# Patient Record
Sex: Female | Born: 1965
Health system: Southern US, Community
[De-identification: ages and names within clinical notes are randomized; demographics above are authoritative.]

## PROBLEM LIST (undated history)

## (undated) DIAGNOSIS — J189 Pneumonia, unspecified organism: Secondary | ICD-10-CM

## (undated) DIAGNOSIS — K5790 Diverticulosis of intestine, part unspecified, without perforation or abscess without bleeding: Secondary | ICD-10-CM

## (undated) DIAGNOSIS — E0590 Thyrotoxicosis, unspecified without thyrotoxic crisis or storm: Secondary | ICD-10-CM

## (undated) DIAGNOSIS — T4145XA Adverse effect of unspecified anesthetic, initial encounter: Secondary | ICD-10-CM

## (undated) DIAGNOSIS — R011 Cardiac murmur, unspecified: Secondary | ICD-10-CM

## (undated) DIAGNOSIS — M1990 Unspecified osteoarthritis, unspecified site: Secondary | ICD-10-CM

## (undated) DIAGNOSIS — F411 Generalized anxiety disorder: Secondary | ICD-10-CM

## (undated) DIAGNOSIS — F17200 Nicotine dependence, unspecified, uncomplicated: Secondary | ICD-10-CM

## (undated) DIAGNOSIS — G5600 Carpal tunnel syndrome, unspecified upper limb: Secondary | ICD-10-CM

## (undated) DIAGNOSIS — T8859XA Other complications of anesthesia, initial encounter: Secondary | ICD-10-CM

## (undated) DIAGNOSIS — E785 Hyperlipidemia, unspecified: Secondary | ICD-10-CM

## (undated) DIAGNOSIS — I471 Supraventricular tachycardia, unspecified: Secondary | ICD-10-CM

## (undated) DIAGNOSIS — Z923 Personal history of irradiation: Secondary | ICD-10-CM

## (undated) DIAGNOSIS — I341 Nonrheumatic mitral (valve) prolapse: Secondary | ICD-10-CM

## (undated) DIAGNOSIS — C50311 Malignant neoplasm of lower-inner quadrant of right female breast: Secondary | ICD-10-CM

## (undated) DIAGNOSIS — R000 Tachycardia, unspecified: Secondary | ICD-10-CM

## (undated) DIAGNOSIS — E119 Type 2 diabetes mellitus without complications: Secondary | ICD-10-CM

## (undated) HISTORY — DX: Nicotine dependence, unspecified, uncomplicated: F17.200

## (undated) HISTORY — PX: ABDOMINAL HYSTERECTOMY: SHX81

## (undated) HISTORY — DX: Pneumonia, unspecified organism: J18.9

## (undated) HISTORY — PX: TUBAL LIGATION: SHX77

## (undated) HISTORY — DX: Personal history of irradiation: Z92.3

## (undated) HISTORY — DX: Thyrotoxicosis, unspecified without thyrotoxic crisis or storm: E05.90

## (undated) HISTORY — DX: Supraventricular tachycardia, unspecified: I47.10

## (undated) HISTORY — DX: Malignant neoplasm of lower-inner quadrant of right female breast: C50.311

## (undated) HISTORY — DX: Generalized anxiety disorder: F41.1

## (undated) HISTORY — DX: Hyperlipidemia, unspecified: E78.5

## (undated) HISTORY — PX: GANGLION CYST EXCISION: SHX1691

## (undated) HISTORY — DX: Diverticulosis of intestine, part unspecified, without perforation or abscess without bleeding: K57.90

## (undated) HISTORY — PX: CARPAL TUNNEL RELEASE: SHX101

## (undated) HISTORY — PX: APPENDECTOMY: SHX54

---
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
## 1997-09-25 ENCOUNTER — Ambulatory Visit (HOSPITAL_COMMUNITY): Admission: RE | Admit: 1997-09-25 | Discharge: 1997-09-25 | Payer: Self-pay | Admitting: *Deleted

## 1997-11-01 ENCOUNTER — Ambulatory Visit (HOSPITAL_COMMUNITY): Admission: RE | Admit: 1997-11-01 | Discharge: 1997-11-01 | Payer: Self-pay | Admitting: *Deleted

## 1998-05-28 ENCOUNTER — Emergency Department (HOSPITAL_COMMUNITY): Admission: EM | Admit: 1998-05-28 | Discharge: 1998-05-28 | Payer: Self-pay | Admitting: Emergency Medicine

## 1998-08-06 ENCOUNTER — Encounter: Admission: RE | Admit: 1998-08-06 | Discharge: 1998-11-04 | Payer: Self-pay | Admitting: Gynecology

## 1998-10-06 ENCOUNTER — Inpatient Hospital Stay (HOSPITAL_COMMUNITY): Admission: AD | Admit: 1998-10-06 | Discharge: 1998-10-09 | Payer: Self-pay | Admitting: Obstetrics and Gynecology

## 1998-10-06 ENCOUNTER — Encounter: Payer: Self-pay | Admitting: Obstetrics and Gynecology

## 1998-10-15 ENCOUNTER — Inpatient Hospital Stay (HOSPITAL_COMMUNITY): Admission: AD | Admit: 1998-10-15 | Discharge: 1998-10-17 | Payer: Self-pay | Admitting: Gynecology

## 1998-10-15 ENCOUNTER — Encounter (INDEPENDENT_AMBULATORY_CARE_PROVIDER_SITE_OTHER): Payer: Self-pay | Admitting: Specialist

## 1998-12-23 ENCOUNTER — Ambulatory Visit (HOSPITAL_COMMUNITY): Admission: RE | Admit: 1998-12-23 | Discharge: 1998-12-23 | Payer: Self-pay | Admitting: Gynecology

## 2000-03-07 ENCOUNTER — Encounter: Payer: Self-pay | Admitting: Gynecology

## 2000-03-11 ENCOUNTER — Inpatient Hospital Stay (HOSPITAL_COMMUNITY): Admission: RE | Admit: 2000-03-11 | Discharge: 2000-03-12 | Payer: Self-pay | Admitting: Gynecology

## 2003-12-31 ENCOUNTER — Ambulatory Visit: Payer: Self-pay | Admitting: Internal Medicine

## 2004-04-14 ENCOUNTER — Ambulatory Visit: Payer: Self-pay | Admitting: Internal Medicine

## 2004-12-21 ENCOUNTER — Emergency Department (HOSPITAL_COMMUNITY): Admission: EM | Admit: 2004-12-21 | Discharge: 2004-12-21 | Payer: Self-pay | Admitting: Emergency Medicine

## 2004-12-28 ENCOUNTER — Ambulatory Visit: Payer: Self-pay | Admitting: Internal Medicine

## 2005-01-04 ENCOUNTER — Ambulatory Visit: Payer: Self-pay | Admitting: Endocrinology

## 2005-01-15 ENCOUNTER — Ambulatory Visit: Payer: Self-pay | Admitting: Internal Medicine

## 2005-02-22 HISTORY — PX: KNEE SURGERY: SHX244

## 2005-02-22 HISTORY — PX: ANTERIOR CRUCIATE LIGAMENT REPAIR: SHX115

## 2005-06-23 ENCOUNTER — Encounter: Payer: Self-pay | Admitting: *Deleted

## 2005-06-30 ENCOUNTER — Ambulatory Visit: Payer: Self-pay | Admitting: Pulmonary Disease

## 2005-07-05 ENCOUNTER — Ambulatory Visit: Payer: Self-pay | Admitting: Internal Medicine

## 2005-07-08 ENCOUNTER — Emergency Department (HOSPITAL_COMMUNITY): Admission: EM | Admit: 2005-07-08 | Discharge: 2005-07-08 | Payer: Self-pay | Admitting: Family Medicine

## 2005-08-16 ENCOUNTER — Ambulatory Visit (HOSPITAL_BASED_OUTPATIENT_CLINIC_OR_DEPARTMENT_OTHER): Admission: RE | Admit: 2005-08-16 | Discharge: 2005-08-16 | Payer: Self-pay | Admitting: Orthopedic Surgery

## 2006-01-20 ENCOUNTER — Ambulatory Visit: Payer: Self-pay | Admitting: Internal Medicine

## 2006-03-24 ENCOUNTER — Ambulatory Visit: Payer: Self-pay | Admitting: Internal Medicine

## 2006-06-15 ENCOUNTER — Ambulatory Visit: Payer: Self-pay | Admitting: Endocrinology

## 2006-11-14 ENCOUNTER — Encounter: Payer: Self-pay | Admitting: *Deleted

## 2006-11-14 DIAGNOSIS — I059 Rheumatic mitral valve disease, unspecified: Secondary | ICD-10-CM | POA: Insufficient documentation

## 2006-11-14 DIAGNOSIS — I10 Essential (primary) hypertension: Secondary | ICD-10-CM | POA: Insufficient documentation

## 2006-11-14 DIAGNOSIS — E0590 Thyrotoxicosis, unspecified without thyrotoxic crisis or storm: Secondary | ICD-10-CM | POA: Insufficient documentation

## 2007-01-26 ENCOUNTER — Ambulatory Visit: Payer: Self-pay | Admitting: Internal Medicine

## 2007-02-07 ENCOUNTER — Ambulatory Visit: Payer: Self-pay | Admitting: Internal Medicine

## 2008-02-02 ENCOUNTER — Ambulatory Visit: Payer: Self-pay | Admitting: Internal Medicine

## 2008-02-13 ENCOUNTER — Encounter (INDEPENDENT_AMBULATORY_CARE_PROVIDER_SITE_OTHER): Payer: Self-pay | Admitting: *Deleted

## 2009-01-30 ENCOUNTER — Telehealth (INDEPENDENT_AMBULATORY_CARE_PROVIDER_SITE_OTHER): Payer: Self-pay | Admitting: *Deleted

## 2009-05-26 ENCOUNTER — Ambulatory Visit: Payer: Self-pay | Admitting: Endocrinology

## 2009-05-26 DIAGNOSIS — F17200 Nicotine dependence, unspecified, uncomplicated: Secondary | ICD-10-CM | POA: Insufficient documentation

## 2009-05-26 DIAGNOSIS — F411 Generalized anxiety disorder: Secondary | ICD-10-CM | POA: Insufficient documentation

## 2009-05-26 DIAGNOSIS — Z90710 Acquired absence of both cervix and uterus: Secondary | ICD-10-CM | POA: Insufficient documentation

## 2009-05-26 LAB — CONVERTED CEMR LAB
LH: 5.84 milliintl units/mL
TSH: 1.34 microintl units/mL (ref 0.35–5.50)

## 2009-07-14 ENCOUNTER — Ambulatory Visit: Payer: Self-pay | Admitting: Internal Medicine

## 2009-07-14 DIAGNOSIS — M79609 Pain in unspecified limb: Secondary | ICD-10-CM | POA: Insufficient documentation

## 2009-10-29 ENCOUNTER — Telehealth: Payer: Self-pay | Admitting: Internal Medicine

## 2010-01-26 DIAGNOSIS — R002 Palpitations: Secondary | ICD-10-CM | POA: Insufficient documentation

## 2010-01-26 DIAGNOSIS — Z8679 Personal history of other diseases of the circulatory system: Secondary | ICD-10-CM | POA: Insufficient documentation

## 2010-01-27 ENCOUNTER — Ambulatory Visit: Payer: Self-pay | Admitting: Internal Medicine

## 2010-03-24 NOTE — Letter (Signed)
Summary: Primary Care Appointment Letter  [facility]  [address] [place], [place] [address]   Phone: [phone]  Fax: [phone]    02/13/2008 MRN: [id number]  [facility] [address] [place], [place]  [address]  Dear [name],   Your Primary Care Physician D. Thomos Lemons DO has indicated that:    ___X____it is time to schedule an appointment with Dr. Everardo All.  Please call the office to schedule.    _______you missed your appointment on______ and need to call and          reschedule.    _______you need to have lab work done.    _______you need to schedule an appointment discuss lab or test results.    _______you need to call to reschedule your appointment that is                       scheduled on _________.     Please call our office as soon as possible. Our phone number is [phone]. Please press option 1. Our office is open 8a-12noon and 1p-5p, Monday through Friday.     Thank you,    [facility]

## 2010-03-24 NOTE — Miscellaneous (Signed)
Summary: Trigger Finger Inject/[facility]  Trigger Finger Inject/[facility]   Imported By: Sherian Rein 07/17/2009 08:30:47  _____________________________________________________________________  External Attachment:    Type:   Image     Comment:   External Document

## 2010-03-24 NOTE — Progress Notes (Signed)
Summary: refill  Medications Added METOPROLOL TARTRATE 50 MG TABS (METOPROLOL TARTRATE) Take one tablet by mouth twice a day       Phone Note Refill Request Call back at Home Phone [phone] Message from:  Patient on [facility] [place] [address] [phone]  Refills Requested: Medication #1:  metoprolol 50mg  bid Initial call taken by: Omer Jack,  January 30, 2009 10:03 AM    New/Updated Medications: METOPROLOL TARTRATE 50 MG TABS (METOPROLOL TARTRATE) Take one tablet by mouth twice a day Prescriptions: METOPROLOL TARTRATE 50 MG TABS (METOPROLOL TARTRATE) Take one tablet by mouth twice a day  #60 x 3   Entered by:   Laurance Flatten CMA   Authorized by:   Laren Boom, MD, [employer]   Signed by:   Laurance Flatten CMA on 01/30/2009   Method used:   Electronically to        [facility] [address] (retail)       [address], [address]       [place], [place]  [address]       Ph: [phone]       Fax: [phone]   RxID:   [id number]

## 2010-03-24 NOTE — Assessment & Plan Note (Signed)
Summary: EAR IS POPPING/NWS   Vital Signs:  Patient Profile:   45 Years Old Female Height:     59 inches Weight:      120.38 pounds BMI:     24.40 Temp:     97.3 degrees F oral Pulse rate:   95 / minute BP sitting:   124 / 78  (right arm)  Vitals Entered By: Glendell Docker (February 07, 2007 3:50 PM)                 Chief Complaint:  c/o right ear pain.  Acute Visit History:      The patient complains of cough and earache.  She denies chest pain and fever.  There is no history of wheezing or shortness of breath associated with her cough.         Current Allergies (reviewed today): ! NAPROSYN ! CODEINE ! VICODIN ! SULFA ! DARVOCET     Review of Systems       patient has history of bruxism   Physical Exam  General:     Well-developed,well-nourished,in no acute distress; alert,appropriate and cooperative throughout examination Ears:     L ear normal and R TM retraction.   Nose:     mucosal edema.   Mouth:     postnasal drip.   Neck:     No deformities, masses, or tenderness noted.  no tenderness at temporomandibular joint. Lungs:     Normal respiratory effort, chest expands symmetrically. Lungs are clear to auscultation, no crackles or wheezes. Heart:     Normal rate and regular rhythm. S1 and S2 normal without gallop, murmur, click, rub or other extra sounds. Extremities:     No lower extremity edema     Impression & Recommendations:  Problem # 1:  EAR PAIN, RIGHT (ICD-388.70) Serous otitis media vs TMJ. Patient right ear pain especially with cold air.  Right ear drum with retraction. She is to wear mouth guard.  Her updated medication list for this problem includes:    Ceftin 500 Mg Tabs (Cefuroxime axetil) ..... One by mouth bid   Complete Medication List: 1)  Toprol Xl 50 Mg Tb24 (Metoprolol succinate) .... Take 1 by mouth qd 2)  Alprazolam 0.25 Mg Tabs (Alprazolam) .... Take 1 tablet by mouth once a day as needed 3)  Albuterol 90 Mcg/act  Aers (Albuterol) .... 2 puffs by mouth once daily as needed 4)  Ceftin 500 Mg Tabs (Cefuroxime axetil) .... One by mouth bid     Prescriptions: CEFTIN 500 MG  TABS (CEFUROXIME AXETIL) one by mouth bid  #14 x 0   Entered and Authorized by:   Dondra Spry DO   Signed by:   Dondra Spry DO on 02/07/2007   Method used:   Electronically sent to ...       [facility]  [address]       [address], [address]       [place], [place]  [address]       Ph: [phone]       Fax: [phone]   RxID:   [id number]  ]  Preventive Care Screening  Last Tetanus Booster:    Date:  07/26/2005    Results:  given

## 2010-03-24 NOTE — Progress Notes (Signed)
Summary: abx for dental visit   Phone Note Call from Patient   Caller: Patient Reason for Call: Talk to Nurse Summary of Call: pt having dental visit and her dentist wants [place] to prescribe abx's for her, about 4 pills uses [facility] [phone] Initial call taken by: Glynda Jaeger,  October 29, 2009 8:52 AM  Follow-up for Phone Call        Pt has not been seen since since 2009 and at taht time there was no indication for SBE prophylaxis.  Left message for pt with this information and that I could not call in an antibiotic Dennis Bast, RN, BSN  October 29, 2009 12:51 PM

## 2010-03-24 NOTE — Assessment & Plan Note (Signed)
Summary: 2 yrs  Medications Added METOPROLOL TARTRATE 50 MG TABS (METOPROLOL TARTRATE) Take 1/2 tablet two times a day        Visit Type:  Follow-up Primary Provider:  Dondra Spry DO   History of Present Illness: Emily Wilson returns today for followup.  She is a very pleasant 45 year old woman with a history of tachypalpitations and documented SVT in the past, which had been very nicely controlled over the last 10 years with beta-blocker therapy.  She denies chest pain.  She denies shortness of breath.  She is a bit frustrated  by her inability to lose weight.  No other complaints.  Current Medications (verified): 1)  Albuterol 90 Mcg/act  Aers (Albuterol) .... 2 Puffs By Mouth Once Daily As Needed 2)  Metoprolol Tartrate 50 Mg Tabs (Metoprolol Tartrate) .... Take 1/2 Tablet Two Times A Day  Allergies: 1)  ! Naprosyn 2)  ! Codeine 3)  ! Vicodin 4)  ! Sulfa 5)  ! 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    Vital Signs:  Patient profile:   45 year old female Height:      59 inches Weight:      123 pounds Pulse rate:   89 / minute BP sitting:   130 / 70  (left arm)  Vitals Entered By: Laurance Flatten CMA (January 27, 2010 2:57 PM)  Physical Exam  General:  Well developed, well nourished, in no acute distress.  HEENT: normal Neck: supple. No JVD. Carotids 2+ bilaterally no bruits Cor: RRR no rubs, gallops or murmur Lungs: CTA Ab: soft, nontender. nondistended. No HSM. Good bowel  sounds Ext: warm. no cyanosis, clubbing or edema Neuro: alert and oriented. Grossly nonfocal. affect pleasant    EKG  Procedure date:  01/27/2010  Findings:      Normal sinus rhythm with rate of: 76.   Impression & Recommendations:  Problem # 1:  SUPRAVENTRICULAR TACHYCARDIA, HX OF (ICD-V12.59) Her symptoms remain well controlled. Her updated medication list for this problem includes:    Metoprolol Tartrate 50 Mg Tabs (Metoprolol tartrate) .Marland Kitchen... Take 1/2 tablet two times a day  Problem # 2:  HYPERTENSION (ICD-401.9) Her pressure is minimally elevated.  She has been asked to maintain a low sodium diet. Her updated medication list for this problem includes:    Metoprolol Tartrate 50 Mg Tabs (Metoprolol tartrate) .Marland Kitchen... Take 1/2 tablet two times a day  Patient Instructions: 1)  Your physician wants you to follow-up in: 12 months with Dr Court Joy will receive a reminder letter in the mail two months in advance. If you don't receive a letter, please call our office to schedule the follow-up appointment.

## 2010-03-24 NOTE — Assessment & Plan Note (Signed)
Summary: injection for trigger finger, ok plot/sae//cd   Vital Signs:  Patient profile:   45 year old female Height:      59 inches Weight:      120 pounds BMI:     24.32 O2 Sat:      96 % on Room air Temp:     98.5 degrees F oral Pulse rate:   103 / minute BP sitting:   110 / 82  (left arm) Cuff size:   regular  Vitals Entered ByBill Salinas CMA (Jul 14, 2009 4:33 PM)  O2 Flow:  Room air  Procedure Note Last Tetanus: given (07/26/2005)  Injections: The patient complains of pain and tenderness. Duration of symptoms: 2 months Indication: chronic pain Consent signed: yes  Procedure # 1: joint injection    Region: palmar    Location: R 1st MCP    Technique: 27 g needle    Medication: 10 mg depomedrol    Anesthesia: 1.0 ml 1% lidocaine w/o epinephrine    Comment: Risks including but not limited by incomplete procedure, bleeding, infection, recurrence were discussed with the patient. Consent form was signed. Tolerated well. Complicatons - none. Good pain relief following the procedure.   Cleaned and prepped with: alcohol and betadine Wound dressing: bandaid Instructions: ice   History of Present Illness: C/o severe R thumb pain from hitting stapler all the time x 2.5 months. It has been triggering too...  Current Medications (verified): 1)  Alprazolam 0.25 Mg  Tabs (Alprazolam) .... Take 1 Tablet By Mouth Once A Day As Needed 2)  Albuterol 90 Mcg/act  Aers (Albuterol) .... 2 Puffs By Mouth Once Daily As Needed 3)  Metoprolol Tartrate 50 Mg Tabs (Metoprolol Tartrate) .... Take One Tablet By Mouth Twice A Day  Allergies (verified): 1)  ! Naprosyn 2)  ! Codeine 3)  ! Vicodin 4)  ! Sulfa 5)  ! 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    Physical Exam  General:  Well-developed,well-nourished,in no acute distress; alert,appropriate and cooperative throughout examination Msk:  R palmar thumb base is very tender. No swelling or mass palpable Neurologic:  No atrophy Skin:  Intact without suspicious lesions or rashes   Impression & Recommendations:  Problem # 1:  HAND PAIN (ICD-729.5) R palmar 1st MCP - contusion capsulitis vs other Assessment New Options discussed. She elected to have it injected. Xray if not better See "Patient Instructions".  ACE wrap applied  Complete Medication List: 1)  Alprazolam 0.25 Mg Tabs (Alprazolam) .... Take 1 tablet by mouth once a day as needed 2)  Albuterol 90 Mcg/act Aers (Albuterol) .... 2 puffs by mouth once daily as needed 3)  Metoprolol Tartrate 50 Mg Tabs (Metoprolol tartrate) .... Take one tablet by mouth twice a day  Patient Instructions: 1)  Naprelan 500 mg by mouth once daily  2)  Pennsaid 2 drops two times a day  3)  Call if you are not better in a reasonable amount of time or if worse.  4)  Keep return office visit w/Dr Everardo All 5)  ACE wrap

## 2010-03-24 NOTE — Letter (Signed)
Summary: Work Writer, Main Office  [address]   [place], [place] [address]   Phone: [phone]  Fax: [phone]     January 27, 2010    Emily Wilson   The above named patient had a medical visit today.  Please take this into consideration when reviewing the time away from work.  For further questions feel free to call the office at [phone].    Sincerely yours,     Anselm Pancoast

## 2010-03-24 NOTE — Assessment & Plan Note (Signed)
Summary: ARTHRITIS/ SLEEPING PROBLEM/ LOV 2008 /NWS   Vital Signs:  Patient profile:   45 year old female Height:      59 inches (149.86 cm) Weight:      121.38 pounds (55.17 kg) BMI:     24.60 O2 Sat:      96 % on Room air Temp:     98.5 degrees F (36.94 degrees C) oral Pulse rate:   81 / minute BP sitting:   120 / 80  (left arm) Cuff size:   regular  Vitals Entered By: Josph Macho RMA (May 26, 2009 3:12 PM)  O2 Flow:  Room air CC: Possible arthritis/ menopause?/ Sleeping problems/ RX for Xanax/ pt would like a refill for Alprazolam/ CF   CC:  Possible arthritis/ menopause?/ Sleeping problems/ RX for Xanax/ pt would like a refill for Alprazolam/ CF.  History of Present Illness: pt states of pain at the pip's of the hands.  she has "trigger finger." pt says she seldom needs albuterol. sher had tah 2003, but no bso.  she says anxiety may be a symptom of menopause. she needs xanax approx 1/week. she had hyperthyroidism with 1999 pregnancy, but has not been rechecked since soon after the pregnancy.   pt wants to minimize expenses due to lack of health insurance.  Current Medications (verified): 1)  Toprol Xl 50 Mg  Tb24 (Metoprolol Succinate) .... Take 1 By Mouth Qd 2)  Alprazolam 0.25 Mg  Tabs (Alprazolam) .... Take 1 Tablet By Mouth Once A Day As Needed 3)  Albuterol 90 Mcg/act  Aers (Albuterol) .... 2 Puffs By Mouth Once Daily As Needed 4)  Ceftin 500 Mg  Tabs (Cefuroxime Axetil) .... One By Mouth Bid 5)  Metoprolol Tartrate 50 Mg Tabs (Metoprolol Tartrate) .... Take One Tablet By Mouth Twice A Day  Allergies (verified): 1)  ! Naprosyn 2)  ! Codeine 3)  ! Vicodin 4)  ! Sulfa 5)  ! Darvocet  Past History:  Past Medical History: SMOKER (ICD-305.1) PREMATURE MENOPAUSE (ICD-256.31) EAR PAIN, RIGHT (ICD-388.70) MITRAL VALVE PROLAPSE (ICD-424.0) HYPERTHYROIDISM (ICD-242.90) HYPERTENSION (ICD-401.9)  Social History: Reviewed history and no changes  required. 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   Neck:  Supple without thyroid enlargement or tenderness.  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 Patient Level II (40347)  Patient Instructions: 1)  please let us know if you want an injection for your "trigger finger." 2)  tests are being ordered for you today.  a few days after the test(s), please call [phone] to hear your test results. 3)  pending the test results, please  continue the same medications for now 4)  return here as needed. Prescriptions: ALPRAZOLAM 0.25 MG  TABS (ALPRAZOLAM) Take 1 tablet by mouth once a day as needed  #30 x 1   Entered and Authorized by:   Minus Breeding MD   Signed by:   Minus Breeding MD on 05/26/2009   Method used:   Print then Give to Patient   RxID:   [id number]

## 2010-06-22 ENCOUNTER — Other Ambulatory Visit (INDEPENDENT_AMBULATORY_CARE_PROVIDER_SITE_OTHER): Payer: Self-pay

## 2010-06-22 ENCOUNTER — Ambulatory Visit (INDEPENDENT_AMBULATORY_CARE_PROVIDER_SITE_OTHER): Payer: Self-pay | Admitting: Endocrinology

## 2010-06-22 ENCOUNTER — Encounter: Payer: Self-pay | Admitting: Endocrinology

## 2010-06-22 VITALS — BP 120/80 | HR 91 | Temp 98.2°F | Resp 14 | Wt 122.0 lb

## 2010-06-22 DIAGNOSIS — R0602 Shortness of breath: Secondary | ICD-10-CM

## 2010-06-22 DIAGNOSIS — J45909 Unspecified asthma, uncomplicated: Secondary | ICD-10-CM

## 2010-06-22 LAB — CBC WITH DIFFERENTIAL/PLATELET
Basophils Absolute: 0 10*3/uL (ref 0.0–0.1)
Basophils Relative: 0.6 % (ref 0.0–3.0)
Eosinophils Absolute: 0.1 10*3/uL (ref 0.0–0.7)
Eosinophils Relative: 1.2 % (ref 0.0–5.0)
HCT: 40.3 % (ref 36.0–46.0)
Hemoglobin: 13.9 g/dL (ref 12.0–15.0)
Lymphocytes Relative: 36.3 % (ref 12.0–46.0)
Lymphs Abs: 3.1 10*3/uL (ref 0.7–4.0)
MCHC: 34.6 g/dL (ref 30.0–36.0)
MCV: 96.1 fl (ref 78.0–100.0)
Monocytes Absolute: 0.7 10*3/uL (ref 0.1–1.0)
Monocytes Relative: 8 % (ref 3.0–12.0)
Neutro Abs: 4.5 10*3/uL (ref 1.4–7.7)
Neutrophils Relative %: 53.9 % (ref 43.0–77.0)
Platelets: 314 10*3/uL (ref 150.0–400.0)
RBC: 4.19 Mil/uL (ref 3.87–5.11)
RDW: 12.9 % (ref 11.5–14.6)
WBC: 8.4 10*3/uL (ref 4.5–10.5)

## 2010-06-22 LAB — IBC PANEL
Iron: 107 ug/dL (ref 42–145)
Saturation Ratios: 33.3 % (ref 20.0–50.0)
Transferrin: 229.5 mg/dL (ref 212.0–360.0)

## 2010-06-22 NOTE — Patient Instructions (Signed)
blood tests are being ordered for you today.  please call [phone] to hear your test results. here is a sample of "advair-100."  take 1 puff 2x a day.  rinse mouth after using. Please call if you do not feel better, so we can check a few other tests.

## 2010-06-22 NOTE — Progress Notes (Signed)
  Subjective:    Patient ID: Emily Wilson, female    DOB: 01-Jul-1965, 45 y.o.   MRN: [id number]  HPI Pt states 3 days of slight wheezing in the chest, but no assoc fever.  She has slight doe.  She smokes very little now (1/2 ppd).  No help with ventolin.  She want to minimize expenditures, due to lack of insurance.   Past Medical History  Diagnosis Date  . Palpitations   . Pain in limb   . Acquired absence of both cervix and uterus   . Anxiety state, unspecified   . Tobacco use disorder   . Mitral valve disorders   . 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 Unspecified essential hypertension   . Personal history of other diseases of circulatory system     Supraventricular Tachycardia   Past Surgical History  Procedure Date  . Abdominal hysterectomy   . Tubal ligation     reports that she has been smoking Cigarettes.  She has been smoking about .5 packs per day. She does not have any smokeless tobacco history on file. Her alcohol and drug histories not on file. family history is not on file. Allergies  Allergen Reactions  . Codeine   . Hydrocodone-Acetaminophen   . Naproxen   . Propoxyphene N-Acetaminophen   . Sulfonamide Derivatives     Review of Systems Denies chest pain    Objective:   Physical Exam GENERAL: no distress LUNGS:  Clear to auscultation       Assessment & Plan:  Chest sxs, may be due to mild asthma.  eval limited by lack of insurance.

## 2010-06-24 DIAGNOSIS — J45909 Unspecified asthma, uncomplicated: Secondary | ICD-10-CM | POA: Insufficient documentation

## 2010-07-07 NOTE — Assessment & Plan Note (Signed)
[facility]                         ELECTROPHYSIOLOGY OFFICE NOTE   HOLLEY, KOCUREK                     MRN:          [id number]  DATE:02/02/2008                            DOB:          Aug 19, 1965    Ms. Baksh returns today for followup.  She is a very pleasant 45-year-  old woman with a history of tachypalpitations and documented SVT in the  past, which had been very nicely controlled over the last 10 years with  beta-blocker therapy.  She denies chest pain.  She denies shortness of  breath.  She has some very mild anxiety and difficulty sleeping at  times, but otherwise has been very stable.  She has in the past had some  high blood pressure, though this is improved.  She unfortunately  continues to smoke cigarettes, though I have asked her to stop.  She has  recently decreased her dose of Toprol because of low blood pressure.   MEDICATIONS:  Toprol-XL 25 a day.   PHYSICAL EXAMINATION:  GENERAL:  She is a pleasant, well-appearing woman  in no distress.  VITAL SIGNS:  Blood pressure was 95/62, the pulse was 93 and regular,  respirations were 18, and weight was 124 pounds.  NECK:  No jugular venous distention.  LUNGS:  Clear bilaterally to auscultation.  No wheezes, rales, or  rhonchi are present.  CARDIOVASCULAR:  Regular rate and rhythm.  Normal S1 and S2.  There are  no murmurs, rubs, or gallops.  ABDOMEN:  Soft and nontender.  EXTREMITIES:  No edema.   EKG demonstrates sinus rhythm with normal axis and intervals.   IMPRESSION:  1. History of supraventricular tachycardia.  2. History of anxiety.   DISCUSSION:  Ms. Macfarlane is stable.  Her SVT is well controlled.  She  will continue with low-dose beta-blockers to have control of this.  With  regard to anxiety, I have refilled her prescription for Xanax with 1  refill to be taken judiciously on an as-needed basis.  I will see the  patient back in 12-24 months as needed or sooner  should she have  symptomatic recurrent palpitations.     Doylene Canning. Ladona Ridgel, MD  Electronically Signed    GWT/MedQ  DD: 02/02/2008  DT: 02/02/2008  Job #: [id number]

## 2010-07-07 NOTE — Assessment & Plan Note (Signed)
[facility]                         ELECTROPHYSIOLOGY OFFICE NOTE   Emily, Wilson                     MRN:          [id number]  DATE:01/26/2007                            DOB:          01-19-1966    Emily Wilson returns today for followup.  She is a very pleasant 45-year-  old woman with a history of SVT controlled with medications, also with  history of noncompliance, who returns today for followup.  She denies  chest pain, she denies shortness of breath.  She has rare episodes of  palpitations lasting for just a few seconds but no sustained episodes of  SVT.  She otherwise has been stable.  Her medications today include  multiple vitamins.  She had previously been on Toprol-XL 50 mg daily but  had not been on these secondary to inability to afford her prescription.  Neck revealed no jugular venous distention.  Lungs clear bilaterally to  auscultation.  No wheezes, rales, or rhonchi were present.  The  cardiovascular exam revealed a regular rate and rhythm with normal S1  and S2.  The extremities demonstrated no edema.  Her EKG demonstrates  sinus rhythm with sinus arrhythmia.   IMPRESSION:  1. History of palpitations and supraventricular tachycardia.  2. Chronic beta blocker therapy.   DISCUSSION:  Overall, Emily Wilson is stable.  She is somewhat anxious.  She is complaining of difficulty with sleeping and asked to have her  Xanax prescription refilled, which has not been filled for over a year.  With this will restart her on her long-acting beta blockers and give her  a prescription for Xanax to be filled with only one refill.  I will see  her back in the office in 1 year, sooner should she have worsening SVT.     Doylene Canning. Ladona Ridgel, MD  Electronically Signed    GWT/MedQ  DD: 01/26/2007  DT: 01/26/2007  Job #: [id number]

## 2010-07-10 NOTE — Op Note (Signed)
[facility]  Patient:    Emily Wilson, Emily Wilson                     MRN: [id number] Proc. Date: 03/11/00 Adm. Date:  [id number] Disc. Date: [id number] Attending:  Merrily Pew                           Operative Report  PREOPERATIVE DIAGNOSES:  Pelvic relaxation, stress urinary incontinence.  POSTOPERATIVE DIAGNOSES:  Pelvic relaxation, stress urinary incontinence.  PROCEDURE:  Total vaginal hysterectomy, McCalls culdoplasty.  SURGEON:  Dr. Audie Box.  ASSISTANT:  Dr. Lily Peer.  ANESTHESIA:  General endotracheal.  ESTIMATED BLOOD LOSS:  50 cc.  COMPLICATIONS:  None.  SPECIMEN:  Uterus.  FINDINGS:  Normal uterus, tubes, ovaries bilaterally.  DESCRIPTION OF PROCEDURE:  The patient was taken to the operating room, placed in the dorsal lithotomy position where she received a lower abdominal perineal preparation with Betadine solution, bladder emptied with in and out Foley catheterization and the patient was draped in the usual fashion. The cervix was visualized, grasped with a tenaculum and circumferentially injected using lidocaine with epinephrine solution submucosally, a total of approximately 4 cc. The cervicomucosa was then circumferentially incised and the paracervical planes developed sharply with Mayo scissors. The posterior cul-de-sac was then sharply entered without difficulty. A long weighted speculum was placed and the right and left uterosacral ligaments identified, clamped, cut and ligated using #0 Vicryl suture and tagged for future reference. The anterior bladder flap was then sharply developed and ultimately the anterior cul-de-sac entered without difficulty. The uterus was then freed from its attachments through clamping, cutting, and ligating of the parametrial tissue cardinal ligaments using #0 Vicryl suture. The uterine vessels were identified and securely ligated. The uterus was then delivered through the vagina. The  uterine ovarian pedicles were clamped bilaterally and the uterus was excised. The pedicles were then ligated using #0 Vicryl suture in a simple suture followed by a suture ligature. A moistened sponge was then placed to hold the intestines in the operative field and inspection of all pedicles showed adequate hemostasis. The tagged uterine ovarian pedicles were then cut free and the weighted speculum replaced with a shorter weighted speculum and the posterior vaginal cuff was then run using #0 Vicryl suture in a running interlocking stitch from uterosacral ligament to uterosacral ligament. A modified type McCalls culdoplasty stitch was then placed using 2-0 Vicryl without difficulty and the bowel packing sponge was then removed. Again adequate hemostasis was visualized and the vagina was closed anterior to posterior using #0 Vicryl suture in interrupted figure-of-eight stitch. The McCalls stitch was tied securely. The vagina was then irrigated and adequate hemostasis was visualized. The bladder was recatheterized with in and out catheterization showing clear abundant yellow urine. The patient was then left in preparation for Dr. Logan Bores to do his pubovaginal sling and his report will be dictated on a separate operative report. DD:  03/11/00 TD:  03/12/00 Job: [id number]

## 2010-07-10 NOTE — H&P (Signed)
[facility]  Patient:    Emily Wilson, Emily Wilson                       MRN: [id number] Adm. Date:  03/10/00 Attending:  Marcial Pacas P. Fontaine, M.D.                         History and Physical  CHIEF COMPLAINT:  Pelvic relaxation, urinary incontinence.  HISTORY OF PRESENT ILLNESS:  A 45 year old G6 P2 AB4 female status post tubal sterilization in the past notes worsening urinary incontinence as well as something protruding from the vagina.  Physical examination shows a cystocele with uterine prolapse and she is admitted at this time for total vaginal hysterectomy, pubovaginal sling, anterior repair of procedure.  PAST MEDICAL HISTORY:  Significant for a history of supraventricular tachycardia for which she is on Toprol and asthma for which she is on p.r.n. inhalers.  History of hyperthyroidism with PT therapy in the past and clinically is euthyroid.  PAST SURGICAL HISTORY:  Laparotomy appendectomy, laparoscopic tubal sterilization, wrist surgery, and therapeutic abortion x 3.  CURRENT MEDICATIONS: 1. Toprol 50 mg per day. 2. Asthma inhalers p.r.n.  ALLERGIES:  SULFA, DEMEROL, CODEINE.  SOCIAL HISTORY:  Cigarettes 1 pack per day.  Alcohol social.  REVIEW OF SYSTEMS:  Noncontributory.  FAMILY HISTORY:  Noncontributory.  PHYSICAL EXAMINATION:  VITAL SIGNS:  Afebrile.  Vital signs are stable.  HEENT:  Normal.  LUNGS:  Clear.  CARDIAC:  Regular rate without rubs, murmurs or gallops.  ABDOMEN:  Benign.  PELVIC:  External BUS vagina with second degree cystocele.  First to second degree uterine prolapse.  Some mild weakness in the rectovaginal septum. Bimanual exam reveals uterus to be normal size, nontender.  Adnexa without masses or tenderness.  ASSESSMENT:  A 45 year old G6 P2 AB4, female status post tubal sterilization with urinary incontinence, cystocele, uterine prolapse, and weakness of the rectovaginal septum.  The patient has been  evaluated by urology and found to be a candidate for a pubovaginal sling and she is admitted for a combined procedure of total vaginal hysterectomy and pubovaginal sling.  The patient does have some weakness in the rectovaginal septum and we have discussed in the office the pros and cons of prophylactic repair of the rectovaginal septum versus expectant management and the possibility of repair in the future if indeed an overt rectocele develops.  The patient would prefer not having a prophylactic repair and she accepts the possibility of future surgery if indeed a rectocele would develop.  The patient understands that this will be a combined procedure and that Dr. Logan Bores will be doing her pubovaginal sling anterior repair and that he will be responsible for her postoperative bladder care and long term bladder care.  I reviewed with her what is involved with hysterectomy and we discussed the issues of ovarian conservation.  The patient desires to keep both of her ovaries although she does give me permission to remove one or both ovaries if significant disease is encountered intraoperatively.  She clearly understands the potential for future problems with her ovaries to include future surgeries as well as the possibility of ovarian cancer and again she wants to keep both ovaries.  The patient also understands that any time during the procedure they we may need to abort the vaginal approach and convert to an abdominal approach and she understands and accepts this.  The immediate intraoperative, postoperative risks of the surgery were discussed  and included the risks of infection requiring prolonged antibiotics, opening and draining of abscessed formations, the risks of hemorrhage necessitating transfusion and the risk of transfusion up to and including HIV were all discussed, understood and accepted.  The realistic risks of inadvertent injury to internal organs, including bowel, bladder,  ureters, vessels and nerves necessitating major exploratory reparative surgeries and future reparative surgeries including osteotomy formation was all discussed, understood and accepted.  Sexuality following hysterectomy was discussed and the potential for orgasmic dysfunction as well as persistent dyspareunia was discussed, understood and accepted.  Lastly the patient understands that hysterectomy is absolute sterility and she understands and accepts this.  The patients questions were all answered to her satisfaction and she is ready to proceed with surgery.  Again the patient has been counseled, separately as far as her bladder surgery by Dr. Logan Bores and she voiced no questions pertaining to this. DD:  03/10/00 TD:  03/10/00 Job: [id number]

## 2010-07-10 NOTE — Assessment & Plan Note (Signed)
[facility]                         ELECTROPHYSIOLOGY OFFICE NOTE   Emily Wilson, Emily Wilson                     MRN:          [id number]  DATE:01/20/2006                            DOB:          1965/07/11    Emily Wilson returns today for followup.  This is a very pleasant young  woman with a history of recurrent SVT which has been well controlled  over the time with medical therapy.  She returns today for followup.  She denies specific problems.  She has recently undergone repair of a  torn ACL with nice result.   On exam today, she is a pleasant, well-appearing young woman in no  distress.  Blood pressure was 114/73, the pulse was 96 and regular,  respirations were 18.  The weight was 114 pounds.  The neck revealed no  jugular venous distention.  Lungs were clear bilaterally to  auscultation.  Cardiovascular exam reveals a regular rate and rhythm  with normal S1 and S2.  Extremities demonstrated no edema.  The EKG  demonstrates sinus rhythm with normal axis and intervals.  Medications  include Toprol-XL 50 daily.   IMPRESSION:  1. Recurrent nonsustained ventricular tachycardia.  2. History of thyroid dysfunction.  3. Palpitations secondary to #1.   DISCUSSION:  Overall, Emily Wilson is stable.  She will continue on her  Toprol-XL.  We will plan to see her back in a year, and if she is doing  okay at the time she can see me back actually in 2 years.  We have  refilled her Toprol-XL prescription today.     Doylene Canning. Ladona Ridgel, MD  Electronically Signed    GWT/MedQ  DD: 01/20/2006  DT: 01/20/2006  Job #: [id number]

## 2010-07-10 NOTE — Discharge Summary (Signed)
[facility]  Patient:    Emily Wilson, Emily Wilson                     MRN: [id number] Adm. Date:  [id number] Disc. Date: [id number] Attending:  Merrily Pew                           Discharge Summary  DISCHARGE DIAGNOSES: 1. Pelvic relaxation. 2. Stress urinary incontinence.  PROCEDURE:  March 11, 2000, total vaginal hysterectomy, McCalls culdoplasty, sling procedure, cystoscopy with suprapubic tube placement.  PATHOLOGY:  [id number].  Uterus hysterectomy benign, proliferative endometrium, focal trophoblastic tissue consistent with old placental implantation site, no hyperplasia carcinoma adenomyosis, serosa with no pathologic abnormalities, benign cervical polyp, mild cervicitis, no dysplasia identified.  HOSPITAL COURSE: The patient was admitted, underwent total vaginal hysterectomy, McCalls culdoplasty, pubovaginal sling, cystoscopy and suprapubic tube placement without difficulty. She was discharged on postoperative day #1, ambulating well, tolerating a regular diet. Precautions, instructions, and followup were reviewed with the patient.  She was to call urology office the day following discharge to make an appointment for followup there and was to be seen in the gynecologic office in two weeks.  DISCHARGE MEDICATIONS: 1. Tylox #15 1-2 p.o. q.4-6h. p.r.n. pain. 2. Macrobid 100 mg p.o. q.d. prophylactically.  CONDITION ON DISCHARGE:  Good DD:  03/28/00 TD:  03/29/00 Job: [id number]

## 2010-07-10 NOTE — Op Note (Signed)
[facility]  Patient:    Emily Wilson, Emily Wilson                     MRN: [id number] Proc. Date: 03/11/00 Adm. Date:  [id number] Disc. Date: [id number] Attending:  Merrily Pew CC:         Timothy P. Fontaine, M.D.   Operative Report  PREOPERATIVE DIAGNOSIS:  Stress urinary incontinence.  POSTOPERATIVE DIAGNOSIS:  Stress urinary incontinence.  OPERATION: 1. Cystoscopy. 2. Pubovaginal sling. 3. Suprapubic tube.  SURGEON:  Jamison Neighbor, M.D.  ANESTHESIA:  General.  COMPLICATIONS:  None.  DRAIN:  Microvasive Cystocath.  BRIEF HISTORY:  This 45 year old female has stress urinary incontinence.  She is scheduled to undergo a total vaginal hysterectomy and enterocele repair for treatment of her pelvic relaxation.  She has requested pubovaginal sling be performed simultaneously.  She has well documented stress incontinence.  She understands the risks and benefits of the procedure including the possibility she will develop urgency incontinence.  She gave full and informed consent.  DESCRIPTION OF PROCEDURE:  After successful induction of general anesthesia, the patient was placed in the dorsolithotomy position, prepped with Betadine, and draped in the usual sterile fashion.  The patient had a successful vagina hysterectomy.  It was felt by Dr. Audie Box that a rectocele repair was not indicated. A formal cystocele repair was not indicated.  The stirrups were lowered down to a low lithotomy position.  The ______ medial thigh.  A weighted vaginal speculum was placed.  The Foley catheter was placed to straight drainage.  The anterior vaginal mucosa was infiltrated with local anesthetic.  A vertical midline incision was made and was carried down until a flap of vaginal mucosa could be raised.  Dissection proceeded posteriorly until the space of Retzius was identified which was opened.  The patient did not require cystocele repair.  The sling was then  constructed of a 2 x 7 piece of Tutoplast with the ends folded over and oversewn with a #1 nylon.  A very small, 1 fingerbreadth incision was made in the suprapubic area to place the sutures.  The tonsil clamp was passed on each side from the abdominal incision down to the vaginal incision, and the sling was pulled up. The sling was then tacked down with 2-0 Vicryl, and 2-0 Vicryl was used to close the midline incision.  Cystoscopy was performed and bladder carefully inspected.  It was free of any tumor or stones.  Both tubes and ovaries were normal configuration and location.  The patient had a Microvasive Cystocath placed under direct vision.  This was allowed to coil in the bladder.  The bladder was inspected with both 12 degree and 7 degree lenses.  No tumors or stones could be seen.  The patient had no evidence of any suture material or sling material anywhere within the bladder.  Elevation of the sling caused nice coaptation of the bladder neck.  The sling was then tied down with an appropriate degree of tension.  With a full bladder and a Crede, there was prompt flow of urine that was straight and not deviate, but no flow without Crede.  There were two fingerbreadths underneath the sling. The cystoscope went in straight with no angulation, and there was coaptation with no excessive obstruction.  The incision was irrigated and was closed with 2-0 Vicryl and then 4-0 Vicryl subcuticular.  Steri-Strips were applied.  The Microvasive was allowed to coil.  Vaginal pads were placed.  The patient tolerated the procedure well and was taken to the recovery room in good condition. DD:  03/11/00 TD:  03/13/00 Job: [id number]

## 2010-07-10 NOTE — Op Note (Signed)
Emily Wilson, Emily Wilson              ACCOUNT NO.:  [id number]   MEDICAL RECORD NO.:  [id number]          PATIENT TYPE:  AMB   LOCATION:  DSC                          FACILITY:  [facility]   PHYSICIAN:  Feliberto Gottron. Turner Daniels, M.D.   DATE OF BIRTH:  07-18-1965   DATE OF PROCEDURE:  08/16/2005  DATE OF DISCHARGE:                                 OPERATIVE REPORT   PREOPERATIVE DIAGNOSIS:  Right knee ACL tear and lateral meniscal tear.   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   PROCEDURE:  Right knee arthroscopic partial medial and lateral meniscectomy  and debridement of flap tear, medial femoral condyle followed by endoscopic  allograft ACL reconstruction using a 10 mm wide bone tendon bone allograft  with two 8 x 20 Linvatec Propel titanium fixation screws.   SURGEON:  Feliberto Gottron. Turner Daniels, MD   FIRST ASSISTANT:  Skip Mayer, PA-C.   ANESTHETIC:  General LMA.   ESTIMATED BLOOD LOSS:  Minimal.   FLUID REPLACEMENT:  Liter crystalloid.   DRAINS PLACED:  None.   TOURNIQUET TIME:  None.   INDICATIONS FOR PROCEDURE:  45 year old woman who sustained a right knee ACL  tear proven by MRI scan with a medial meniscal tear.  She is very active,  does quite a bit of sports, extensive walking and climbing and desires an  ACL reconstruction to stabilize her knee which is giving out on her.  Risks  and benefits of surgery discussed.  She has elected allograft reconstruction  and she is prepared for surgical intervention.  Questions have been  answered.   DESCRIPTION OF PROCEDURE:  The patient identified by armband, taken the  operating room at [facility] day surgery center where the appropriate anesthetic  monitors were attached and general LMA anesthesia induced with the patient  in supine position.  She received a gram of Ancef perioperatively.  Lateral  post  applied to the table as well as Stulberg Mark II foot positioner and  the right lower extremity prepped, draped in usual sterile fashion from the  ankle to the mid thigh.  Using a #11 blade, standard inferomedial,  inferolateral peripatellar portals were then made allowing introduction of  the arthroscope through the inferolateral portal and the outflow through the  inferomedial portal.  Diagnostic arthroscopy revealed a normal patella and  trochlea with maybe a little bit of grade 1 to 2 chondromalacia.  On the  medial femoral condyle.  She had grade 3 chondromalacia with flap tears.  These were debrided as was some degenerative tearing of the posterior to the  posterior medial horn of the medial meniscus and the anterior horn of the  lateral meniscus.  The lateral femoral condyle, lateral tibial condyle were  good condition as was the medial tibial condyle.  The ACL was avulsed off  its origin on the femur and the stumps were debrided back to stable margin.  We then performed a standard 1-2 mm superior lateral notchplasty and the  notch was somewhat  stenotic superiorly.  Meanwhile at the back table my  assistant, Skip Mayer PA-C was preparing a 10 mm wide bone tendon bone  allograft.  With a single 20 gauge wire through the tibial bone plug the  femoral bone plug was 23 mm in length.  Getting back to the knee, I was  preparing the tunnel in the tibia and using the Linvatec tibial pin  positioner set at 60 degrees.  A pin was driven through the tibia, a  centimeter distal to and 1.5 cm medial to the tibial tubercle up through the  posterior aspect of the ACL footprint and then over reamed with a 10 mm  reamer using a 1.5 cm incision in the skin.  With the knee flexed at 50  degrees a second tunnel was then placed in the femur using the 7 mm over-the-  top guide at the 10:30 position and likewise reamed with the 10 mm reamer to  a depth of 35 mm.  The superior notch was placed in the  femoral tunnel and a  lateral notch in the tibial tunnel using the 3.5 Gator sucker shaver and the  4.2 [place] sucker shaver, soft tissue was also trimmed and cleaned  during this procedure.  Photographic documentation was made of the tunnel  and tunnel position and then the allograft was threaded up through the  tibial tunnel with the femoral bone plug first and the femoral bone plug was  then inserted into the femoral tunnel using an accessory inferomedial  arthroscopic portal and the guide pins to push the bone plug up into the  femur.  The knee was then flexed to about a 110 degrees and using a nitinol  wire an 8 x 20 fixation screw was placed into the femoral tunnel through the  little notch that we had cut with good firm fixation of the femoral bone  plug.  We applied traction to the graft multiple times to assure that it was  stretched appropriately or pre-tensioned.  The knee was then set at 45  degrees flexion.  The graft rotated 90 degrees distally and the tibial bone  plug locked into place with an 8 x 20 Linvatec Propel titanium screw with  traction applied to the graft in a reverse Lachman's maneuver.  The knee was  then taken to full extension and Lachman's test was negative.  There is no  graft impingement noted.  The bone plug was trimmed as it came out of the  tibia removing about 5 mm of the 30 that was present and then the 20 gauge  wire was removed.  The knee was continuously irrigated.  We then closed the  graft insertion wound with subcutaneous 3-0 Vicryl and 4-0 Monocryl  subcuticular suture.  The other portals were left open and a dressing of  Xeroform, 4x4 dressing sponges, Webril and Ace wrap applied.  The patient  was then awakened and taken to the recovery room without difficulty.      Feliberto Gottron. Turner Daniels, M.D.  Electronically Signed     FJR/MEDQ  D:  08/16/2005  T:  08/16/2005  Job:  [id number]

## 2010-07-14 ENCOUNTER — Emergency Department (HOSPITAL_COMMUNITY): Payer: Self-pay

## 2010-07-14 ENCOUNTER — Emergency Department (HOSPITAL_COMMUNITY)
Admission: EM | Admit: 2010-07-14 | Discharge: 2010-07-14 | Disposition: A | Discharge status: Home / Self Care | Payer: Self-pay | Attending: Emergency Medicine | Admitting: Emergency Medicine

## 2010-07-14 ENCOUNTER — Telehealth: Payer: Self-pay | Admitting: Internal Medicine

## 2010-07-14 DIAGNOSIS — R0789 Other chest pain: Secondary | ICD-10-CM

## 2010-07-14 DIAGNOSIS — R002 Palpitations: Secondary | ICD-10-CM | POA: Insufficient documentation

## 2010-07-14 DIAGNOSIS — J45909 Unspecified asthma, uncomplicated: Secondary | ICD-10-CM | POA: Insufficient documentation

## 2010-07-14 DIAGNOSIS — R0609 Other forms of dyspnea: Secondary | ICD-10-CM | POA: Insufficient documentation

## 2010-07-14 DIAGNOSIS — R0989 Other specified symptoms and signs involving the circulatory and respiratory systems: Secondary | ICD-10-CM | POA: Insufficient documentation

## 2010-07-14 DIAGNOSIS — I059 Rheumatic mitral valve disease, unspecified: Secondary | ICD-10-CM | POA: Insufficient documentation

## 2010-07-14 LAB — CBC
HCT: 43.5 % (ref 36.0–46.0)
Hemoglobin: 15.1 g/dL — ABNORMAL HIGH (ref 12.0–15.0)
MCH: 32.3 pg (ref 26.0–34.0)
MCHC: 34.7 g/dL (ref 30.0–36.0)
MCV: 93.1 fL (ref 78.0–100.0)
Platelets: 302 10*3/uL (ref 150–400)
RBC: 4.67 MIL/uL (ref 3.87–5.11)
RDW: 12.7 % (ref 11.5–15.5)
WBC: 10.9 10*3/uL — ABNORMAL HIGH (ref 4.0–10.5)

## 2010-07-14 LAB — POCT CARDIAC MARKERS
CKMB, poc: <1 ng/mL — ABNORMAL LOW (ref 1.0–8.0)
Myoglobin, poc: 51.8 ng/mL (ref 12–200)
Troponin i, poc: <0.05 ng/mL (ref 0.00–0.09)

## 2010-07-14 LAB — DIFFERENTIAL
Basophils Absolute: 0.1 10*3/uL (ref 0.0–0.1)
Basophils Relative: 1 % (ref 0–1)
Eosinophils Absolute: 0.2 10*3/uL (ref 0.0–0.7)
Eosinophils Relative: 2 % (ref 0–5)
Lymphocytes Relative: 32 % (ref 12–46)
Lymphs Abs: 3.5 10*3/uL (ref 0.7–4.0)
Monocytes Absolute: 0.8 10*3/uL (ref 0.1–1.0)
Monocytes Relative: 7 % (ref 3–12)
Neutro Abs: 6.4 10*3/uL (ref 1.7–7.7)
Neutrophils Relative %: 59 % (ref 43–77)

## 2010-07-14 LAB — BASIC METABOLIC PANEL
BUN: 8 mg/dL (ref 6–23)
CO2: 24 mEq/L (ref 19–32)
Calcium: 10 mg/dL (ref 8.4–10.5)
Chloride: 104 mEq/L (ref 96–112)
Creatinine, Ser: 0.58 mg/dL (ref 0.4–1.2)
GFR calc Af Amer: >60 mL/min (ref >60)
GFR calc non Af Amer: >60 mL/min (ref >60)
Glucose, Bld: 103 mg/dL — ABNORMAL HIGH (ref 70–99)
Potassium: 4.1 mEq/L (ref 3.5–5.1)
Sodium: 139 mEq/L (ref 135–145)

## 2010-07-14 MED ORDER — IOHEXOL 300 MG/ML  SOLN
80.0000 mL | Freq: Once | INTRAMUSCULAR | Status: AC | PRN
  Administered 2010-07-14: 80 mL via INTRAVENOUS

## 2010-07-14 NOTE — Telephone Encounter (Signed)
RN s/w Pt re: chest pain, sob and feels like her heart is fluttering/jumping. Pt had increased her metoprolol dose to 50mg  bid for hx of svt, but that does not seem to help.  RN advised Pt to proceed directly to ER; Pt reports being in [facility] ER parking lot. Hospital cardiology team notified.

## 2010-07-14 NOTE — Telephone Encounter (Signed)
Pt having chest pain and sob for couple weeks. Pt having chest pain now and it getting worse and now the left shoulder hurts as well.

## 2010-07-29 ENCOUNTER — Ambulatory Visit (INDEPENDENT_AMBULATORY_CARE_PROVIDER_SITE_OTHER): Payer: Self-pay | Admitting: Internal Medicine

## 2010-07-29 ENCOUNTER — Encounter: Payer: Self-pay | Admitting: Internal Medicine

## 2010-07-29 VITALS — BP 92/58 | HR 72 | Ht 59.0 in | Wt 124.0 lb

## 2010-07-29 DIAGNOSIS — R002 Palpitations: Secondary | ICD-10-CM

## 2010-07-29 DIAGNOSIS — R0789 Other chest pain: Secondary | ICD-10-CM | POA: Insufficient documentation

## 2010-07-29 DIAGNOSIS — Z8679 Personal history of other diseases of the circulatory system: Secondary | ICD-10-CM

## 2010-07-29 NOTE — Assessment & Plan Note (Signed)
Her SVT has been well-controlled. I do not think that this is the cause of her chest discomfort.

## 2010-07-29 NOTE — Progress Notes (Signed)
HPI Emily Wilson returns today for followup. I had initially seen her several months ago. At that time her SVT was well-controlled and I recommended a period of watchful waiting. The patient has developed substernal chest discomfort over the last several weeks. This is not related to exertion. It is associated with neck discomfort and some shortness of breath. She notes that she has been under increasing stress at work. There is no additional radiation. She denies cough or fevers. No recent trauma. Allergies  Allergen Reactions  . Codeine   . Hydrocodone-Acetaminophen   . Naproxen   . Propoxyphene N-Acetaminophen   . Sulfonamide Derivatives      Current Outpatient Prescriptions  Medication Sig Dispense Refill  . albuterol (PROVENTIL,VENTOLIN) 90 MCG/ACT inhaler Inhale 2 puffs into the lungs daily as needed.        Marland Kitchen aspirin 81 MG tablet Take 81 mg by mouth daily.        . metoprolol (LOPRESSOR) 50 MG tablet Take 50 mg by mouth 2 (two) times daily.       . Multiple Vitamins-Minerals (MULTIVITAMIN,TX-MINERALS) tablet Take 1 tablet by mouth daily.           Past Medical History  Diagnosis Date  . Palpitations   . Pain in limb   . Acquired absence of both cervix and uterus   . Anxiety state, unspecified   . Tobacco use disorder   . Mitral valve disorders   . 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 Unspecified essential hypertension   . Personal history of other diseases of circulatory system     Supraventricular Tachycardia    ROS:   All systems reviewed and negative except as noted in the HPI.   Past Surgical History  Procedure Date  . Abdominal hysterectomy   . Tubal ligation      No family history on file.   History   Social History  . Marital Status: Divorced    Spouse Name: N/A    Number of Children: N/A  . Years of Education: N/A   Occupational History  . Cashier    Social History Main  Topics  . Smoking status: Current Everyday Smoker -- 0.5 packs/day    Types: Cigarettes  . Smokeless tobacco: Not on file   Comment: Pt trying to Quit-down to 10 cigarettes daily from 3 packs daily in 01/2010.  Marland Kitchen Alcohol Use: Not on file  . Drug Use: Not on file  . Sexually Active: Not on file   Other Topics Concern  . Not on file   Social History Narrative   In LTR w/boyfriendWorks restaurant, cashier     BP 92/58  Pulse 72  Ht 4\' 11"  (1.499 m)  Wt 124 lb (56.246 kg)  BMI 25.04 kg/m2  Physical Exam:  Well appearing NAD HEENT: Unremarkable Neck:  No JVD, no thyromegally Lymphatics:  No adenopathy Back:  No CVA tenderness Lungs:  Clear HEART:  Regular rate rhythm, no murmurs, no rubs, no clicks Abd:  Flat, positive bowel sounds, no organomegally, no rebound, no guarding Ext:  2 plus pulses, no edema, no cyanosis, no clubbing Skin:  No rashes no nodules Neuro:  CN II through XII intact, motor grossly intact  EKG Normal sinus rhythm. Questionable septal infarct  Assess/Plan:

## 2010-07-29 NOTE — Patient Instructions (Signed)
Your physician has requested that you have an exercise tolerance test. For further information please visit [email or web address]. Please also follow instruction sheet, as given.   

## 2010-07-29 NOTE — Assessment & Plan Note (Signed)
The etiology of her symptoms is unclear. She is a smoker and has some risk factors for coronary disease. Her symptoms are fairly atypical. Her EKG is mildly abnormal. I recommend an initial exercise treadmill test as an initial screening procedure. Additional recommendations will be based on the results of her stress test.

## 2010-07-31 NOTE — Consult Note (Signed)
NAMESHARIAN, DELIA              ACCOUNT NO.:  [id number]  MEDICAL RECORD NO.:  [id number]           PATIENT TYPE:  E  LOCATION:  [facility]                         FACILITY:  [facility]  PHYSICIAN:  Thomas C. Wall, MD, FACCDATE OF BIRTH:  Feb 21, 1966  DATE OF CONSULTATION:  07/14/2010 DATE OF DISCHARGE:  07/14/2010                                CONSULTATION   I was asked by Dr. Rubin Payor of the [facility] Emergency Department to evaluate Thereasa Parkin with palpitations and chest discomfort.  HISTORY OF PRESENT ILLNESS:  She is 45 years of age, white female, well- known to Dr. Lewayne Bunting who sees her for history of SVT and palpitations.  Over the last 2 weeks, she has had almost nightly palpitations with feelings of something running through her heart.  Usually occurs when she first lies down.  Of note, she has cut her cigarettes back to 10 a day, but she smokes around 6-8 when she gets home late in the afternoon over about a 2-hour period prior to going to bed.  She also takes her Toprol 100 mg at bedtime right before she goes to bed.  She denies any symptoms consistent with SVT.  Spells generally last a minute or 2.  She drinks one cup of coffee morning.  She does not use caffeine otherwise.  Denies any drug use.  She is compliant with her medications.  PAST MEDICAL HISTORY:  Her current meds are: 1. Toprol-XL 100 mg at bedtime. 2. Advair inhaler p.r.n.  ALLERGIES:  See chart.  SOCIAL HISTORY:  [place]  She works a steady job.  She smokes about 10 cigarettes a day, 2 in the morning prior to going to work at 11 and 6-8 at night.  FAMILY HISTORY:  Unremarkable.  REVIEW OF SYSTEMS:  Other than in HPI negative.  PHYSICAL EXAMINATION:  GENERAL:  She is in no acute distress. VITAL SIGNS:  Her blood pressure is 125/74, pulse 71 and regular, respirations 16, sats 99%.  EKG shows normal sinus rhythm with no ST- segment changes.  Compared to her EKG March 07, 2000,  there has been no change. HEENT:  Normocephalic, atraumatic.  PERRLA.  Extraocular movements intact.  Sclerae are clear. NECK:  Supple.  Carotid upstrokes equal bilaterally without bruits.  No thyromegaly.  Trachea is midline. LUNGS:  Clear to auscultation and percussion. HEART:  Nondisplaced PMI.  Normal S1 and S2.  No murmur, rub, or gallop. ABDOMEN:  Soft, good bowel sounds.  No midline bruit. EXTREMITIES:  No cyanosis, clubbing, or edema.  Pulses are intact. NEURO:  Grossly intact. SKIN:  Unremarkable.  LABORATORY DATA:  Normal CBC, negative point-of-care, normal electrolytes.  Her CT angiogram showed no pulmonary emboli, no evidence of any suspicious pulmonary nodules.  Heart was normal in size.  No pericardial effusion.  EKG as above.  ASSESSMENT:  Palpitations, most likely these are premature ventricular contraction related to excess nicotine use and stimulus prior to going to bed.  These also can be cyclical in nature and may get better spontaneously, as I told her today without any change in therapy.  For the  time being, I have asked her to smoke less prior to going to bed, take her Toprol-XL 100 mg at least an hour prior to going to bed. I have advised to quit altogether if possible.  She will be discharged from the emergency room.     Thomas C. Daleen Squibb, MD, [facility]     TCW/MEDQ  D:  07/14/2010  T:  07/15/2010  Job:  [id number]  Electronically Signed by Valera Castle MD [facility] on 07/31/2010 01:36:21 PM

## 2010-08-11 ENCOUNTER — Encounter: Payer: Self-pay | Admitting: Physician Assistant

## 2010-08-11 ENCOUNTER — Ambulatory Visit (INDEPENDENT_AMBULATORY_CARE_PROVIDER_SITE_OTHER): Payer: Self-pay | Admitting: Physician Assistant

## 2010-08-11 DIAGNOSIS — R079 Chest pain, unspecified: Secondary | ICD-10-CM

## 2010-08-11 NOTE — Progress Notes (Signed)
Exercise Treadmill Test  Pre-Exercise Testing Evaluation Rhythm: normal sinus  Rate: 60   PR:  .16 QRS:  .06  QT:  .40 QTc: .40     Test  Exercise Tolerance Test Ordering MD: Lewayne Bunting, MD  Interpreting MD:  Tereso Newcomer PA-C  Unique Test No: 1  Treadmill:  1  Indication for ETT: chest pain - rule out ischemia  Contraindication to ETT: No   Stress Modality: exercise - treadmill  Cardiac Imaging Performed: non   Protocol: standard Bruce - maximal  Max BP124/68  Max MPHR (bpm):  176 85% MPR (bpm):  149  MPHR obtained (bpm):  116 % MPHR obtained:   Reached 85% MPHR (min:sec):   Total Exercise Time (min-sec): 8:06  Workload in METS:  10.2 Borg Scale: 15  Reason ETT Terminated:  patient's desire to stop    ST Segment Analysis At Rest: normal ST segments - no evidence of significant ST depression With Exercise: no evidence of significant ST depression  Other Information Arrhythmia:  No Angina during ETT:  absent (0) Quality of ETT:  non-diagnostic  ETT Interpretation:  normal - no evidence of ischemia by ST analysis at submaximal exercise  Comments: Fair exercise tolerance. No chest pain. Normal BP response. No ST-T changes at submaximal exercise to suggest ischemia. Of note, patient has right knee pain.  Recommendations: Schedule Lexiscan Myoview.

## 2010-08-11 NOTE — Progress Notes (Signed)
Addended by: Tarri Fuller on: 08/11/2010 10:23 AM   Modules accepted: Orders

## 2010-08-11 NOTE — Patient Instructions (Signed)
Your physician has requested that you have a lexiscan myoview [id number]. For further information please visit [email or web address] Please follow instruction sheet, as given.

## 2010-08-14 ENCOUNTER — Telehealth: Payer: Self-pay | Admitting: Internal Medicine

## 2010-08-14 NOTE — Telephone Encounter (Signed)
Called and spoke with patient and advised her she did not have a Stress test at the hospital and she needs to keep her Nuc appointment on Mon  She has concerns about the test and says she feels like she is coming down with a sore throat  I again stressed the importance of the test

## 2010-08-14 NOTE — Telephone Encounter (Signed)
Pt has nuc test next week, and wants to have a regular stress test since she just had a nuc test at the hospital a few weeks ago

## 2010-08-19 ENCOUNTER — Ambulatory Visit (HOSPITAL_COMMUNITY): Payer: Self-pay | Attending: Internal Medicine | Admitting: Radiology

## 2010-08-19 DIAGNOSIS — I059 Rheumatic mitral valve disease, unspecified: Secondary | ICD-10-CM

## 2010-08-19 DIAGNOSIS — R079 Chest pain, unspecified: Secondary | ICD-10-CM | POA: Insufficient documentation

## 2010-08-19 MED ORDER — TECHNETIUM TC 99M TETROFOSMIN IV KIT
11.0000 | PACK | Freq: Once | INTRAVENOUS | Status: AC | PRN
  Administered 2010-08-19: 11 via INTRAVENOUS

## 2010-08-19 MED ORDER — TECHNETIUM TC 99M TETROFOSMIN IV KIT
33.0000 | PACK | Freq: Once | INTRAVENOUS | Status: AC | PRN
  Administered 2010-08-19: 33 via INTRAVENOUS

## 2010-08-19 MED ORDER — REGADENOSON 0.4 MG/5ML IV SOLN
0.4000 mg | Freq: Once | INTRAVENOUS | Status: AC
  Administered 2010-08-19: 0.4 mg via INTRAVENOUS

## 2010-08-19 NOTE — Progress Notes (Signed)
[facility] SITE 3 NUCLEAR MED [address] [place] [address] [phone]  Cardiology Nuclear Med Study  Emily Wilson is a 45 y.o. female [id number] 1966/02/12   Nuclear Med Background Indication for Stress Test:  Evaluation for Ischemia and 07/15/10 Post Hospital: [facility] History:  Asthma,'00 Echo:EF 65%,08/11/10 GXT, and MVP Cardiac Risk Factors: Hypertension and Smoker  Symptoms:  Chest Pain and Palpitations   Nuclear Pre-Procedure Caffeine/Decaff Intake:  None NPO After: 7:30pm   Lungs:  clear IV 0.9% NS with Angio Cath:  20g  IV Site: R Wrist  IV Started by:  Stanton Kidney, EMT-P  Chest Size (in):  36 Cup Size: B  Height: 4' 11.5" (1.511 m)  Weight:  121 lb (54.885 kg)  BMI:  Body mass index is 24.03 kg/(m^2). Tech Comments:  Metoprolol held this am, per patient.    Nuclear Med Study 1 or 2 day study: 1 day  Stress Test Type:  Treadmill/Lexiscan  Reading MD: Arvilla Meres, MD  Order Authorizing Provider:  Ladona Ridgel  Resting Radionuclide: Technetium 54m Tetrofosmin  Resting Radionuclide Dose: 11.0 mCi   Stress Radionuclide:  Technetium 74m Tetrofosmin  Stress Radionuclide Dose: 33.0 mCi           Stress Protocol Rest HR: 60 Stress HR: 110  Rest BP: 101/69 Stress BP: 136/57  Exercise Time (min): n/a METS: n/a   Predicted Max HR: 176 bpm % Max HR: 62.5 bpm Rate Pressure Product: 57846   Dose of Adenosine (mg):  n/a Dose of Lexiscan: 0.4 mg  Dose of Atropine (mg): n/a Dose of Dobutamine: n/a mcg/kg/min (at max HR)  Stress Test Technologist: Milana Na, EMT-P  Nuclear Technologist:  Doyne Keel, CNMT     Rest Procedure:  Myocardial perfusion imaging was performed at rest 45 minutes following the intravenous administration of Technetium 58m Tetrofosmin. Rest ECG: NSR  Stress Procedure:  The patient received IV Lexiscan 0.4 mg over 15-seconds with concurrent low level exercise and then Technetium 71m Tetrofosmin was injected at  30-seconds while the patient continued walking one more minute.  There were no significant changes with Lexiscan.  Quantitative spect images were obtained after a 45-minute delay. Stress ECG: No significant change from baseline ECG  QPS Raw Data Images:  Normal; no motion artifact; normal heart/lung ratio. Stress Images:  Normal homogeneous uptake in all areas of the myocardium. Rest Images:  Normal homogeneous uptake in all areas of the myocardium. Subtraction (SDS):  Normal Transient Ischemic Dilatation (Normal <1.22):  1.04 Lung/Heart Ratio (Normal <0.45):  0.29  Quantitative Gated Spect Images QGS EDV:  67 ml QGS ESV:  23 ml QGS cine images:  Normal Wall Motion QGS EF: 67%  Impression Exercise Capacity:  Lexiscan with no exercise. BP Response:  n/a Clinical Symptoms:  No chest pain. ECG Impression:  No significant ECG changes with Lexiscan. Comparison with Prior Nuclear Study: No images to compare  Overall Impression:  Normal stress nuclear study.     [name]

## 2010-08-20 NOTE — Progress Notes (Signed)
Nuc med study routed to Dr. Ladona Ridgel 08/20/10 Domenic Polite

## 2010-09-25 ENCOUNTER — Telehealth: Payer: Self-pay | Admitting: Internal Medicine

## 2010-09-25 NOTE — Telephone Encounter (Signed)
Pt calling for results of stress test °

## 2010-09-25 NOTE — Telephone Encounter (Signed)
Pt aware of results of normal myoview.

## 2011-01-29 ENCOUNTER — Encounter: Payer: Self-pay | Admitting: Internal Medicine

## 2011-01-29 ENCOUNTER — Ambulatory Visit (INDEPENDENT_AMBULATORY_CARE_PROVIDER_SITE_OTHER): Payer: Self-pay | Admitting: Internal Medicine

## 2011-01-29 VITALS — BP 126/66 | HR 72 | Ht 59.5 in | Wt 114.0 lb

## 2011-01-29 DIAGNOSIS — I10 Essential (primary) hypertension: Secondary | ICD-10-CM

## 2011-01-29 DIAGNOSIS — Z8679 Personal history of other diseases of the circulatory system: Secondary | ICD-10-CM

## 2011-01-29 DIAGNOSIS — I471 Supraventricular tachycardia: Secondary | ICD-10-CM

## 2011-01-29 MED ORDER — ALBUTEROL 90 MCG/ACT IN AERS: 2.0000 | INHALATION_SPRAY | Freq: Every day | RESPIRATORY_TRACT | Status: DC | PRN

## 2011-01-29 MED ORDER — METOPROLOL TARTRATE 50 MG PO TABS: 50.0000 mg | ORAL_TABLET | Freq: Two times a day (BID) | ORAL | Status: DC

## 2011-01-29 NOTE — Patient Instructions (Signed)
Your physician wants you to follow-up in:  12 months.  You will receive a reminder letter in the mail two months in advance. If you don't receive a letter, please call our office to schedule the follow-up appointment.   

## 2011-02-01 ENCOUNTER — Encounter: Payer: Self-pay | Admitting: Internal Medicine

## 2011-02-01 NOTE — Assessment & Plan Note (Signed)
Her SVT remains well controlled. She will continue her current medical therapy. No indication for catheter ablation at this time.

## 2011-02-01 NOTE — Assessment & Plan Note (Signed)
Her blood pressure is fairly well controlled. She will continue her current medical therapy. 

## 2011-02-01 NOTE — Progress Notes (Signed)
HPI Ms. Stachnik returns today for followup. She is a 45 year old woman with a history of SVT and hypertension. She has been experiencing increased anxiety recently and she has finally stopped smoking. She denies chest pain or shortness of breath. Her palpitations are present but well controlled. She has had no syncope or peripheral edema. Allergies  Allergen Reactions  . Codeine   . Hydrocodone-Acetaminophen   . Naproxen   . Propoxyphene N-Acetaminophen   . Sulfonamide Derivatives      Current Outpatient Prescriptions  Medication Sig Dispense Refill  . albuterol (PROVENTIL,VENTOLIN) 90 MCG/ACT inhaler Inhale 2 puffs into the lungs daily as needed.  17 g  12  . aspirin 81 MG tablet Take 81 mg by mouth daily.        . metoprolol (LOPRESSOR) 50 MG tablet Take 1 tablet (50 mg total) by mouth 2 (two) times daily.  60 tablet  12  . Multiple Vitamins-Minerals (MULTIVITAMIN,TX-MINERALS) tablet Take 1 tablet by mouth daily.           Past Medical History  Diagnosis Date  . Palpitations   . Pain in limb   . Acquired absence of both cervix and uterus   . Anxiety state, unspecified   . Tobacco use disorder   . Mitral valve disorders   . 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 Unspecified essential hypertension   . Personal history of other diseases of circulatory system     Supraventricular Tachycardia  . HTN (hypertension)   . Hyperthyroidism   . Smoker     ROS:   All systems reviewed and negative except as noted in the HPI.   Past Surgical History  Procedure Date  . Abdominal hysterectomy   . Tubal ligation      No family history on file.   History   Social History  . Marital Status: Divorced    Spouse Name: N/A    Number of Children: N/A  . Years of Education: N/A   Occupational History  . Cashier    Social History Main Topics  . Smoking status: Former Smoker -- 0.5 packs/day    Types:  Cigarettes  . Smokeless tobacco: Not on file   Comment: Pt trying to Quit-down to 10 cigarettes daily from 3 packs daily in 01/2010.  Marland Kitchen Alcohol Use: Not on file  . Drug Use: Not on file  . Sexually Active: Not on file   Other Topics Concern  . Not on file   Social History Narrative   In LTR w/boyfriendWorks restaurant, cashier     BP 126/66  Pulse 72  Ht 4' 11.5" (1.511 m)  Wt 51.71 kg (114 lb)  BMI 22.64 kg/m2  Physical Exam:  Well appearing NAD HEENT: Unremarkable Neck:  No JVD, no thyromegally Lungs:  Clear with no wheezes, rales, or rhonchi. HEART:  Regular rate rhythm, no murmurs, no rubs, no clicks Abd:  soft, positive bowel sounds, no organomegally, no rebound, no guarding Ext:  2 plus pulses, no edema, no cyanosis, no clubbing Skin:  No rashes no nodules Neuro:  CN II through XII intact, motor grossly intact  EKG Normal sinus rhythm with normal axis and intervals.  Assess/Plan:

## 2011-03-15 ENCOUNTER — Ambulatory Visit (INDEPENDENT_AMBULATORY_CARE_PROVIDER_SITE_OTHER): Payer: Self-pay | Admitting: Endocrinology

## 2011-03-15 ENCOUNTER — Encounter: Payer: Self-pay | Admitting: Endocrinology

## 2011-03-15 VITALS — BP 122/80 | HR 102 | Temp 97.5°F | Ht 59.5 in | Wt 125.0 lb

## 2011-03-15 DIAGNOSIS — J069 Acute upper respiratory infection, unspecified: Secondary | ICD-10-CM

## 2011-03-15 DIAGNOSIS — F17200 Nicotine dependence, unspecified, uncomplicated: Secondary | ICD-10-CM

## 2011-03-15 MED ORDER — DOXYCYCLINE HYCLATE 100 MG PO TABS: 100.0000 mg | ORAL_TABLET | Freq: Two times a day (BID) | ORAL | Status: AC

## 2011-03-15 NOTE — Patient Instructions (Signed)
i have sent a prescription to your pharmacy, for an antibiotic. I hope you feel better soon.  If you don't feel better by next week, please back.   

## 2011-03-15 NOTE — Progress Notes (Signed)
  Subjective:    Patient ID: Emily Wilson, female    DOB: 1965/07/10, 46 y.o.   MRN: [id number]  HPI 1 week of right ear pain, but no assoc nasal congestion. Past Medical History  Diagnosis Date  . Palpitations   . Pain in limb   . Acquired absence of both cervix and uterus   . Anxiety state, unspecified   . Tobacco use disorder   . Mitral valve disorders   . 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 Unspecified essential hypertension   . Personal history of other diseases of circulatory system     Supraventricular Tachycardia  . HTN (hypertension)   . Hyperthyroidism   . Smoker     Past Surgical History  Procedure Date  . Abdominal hysterectomy   . Tubal ligation     History   Social History  . Marital Status: Divorced    Spouse Name: N/A    Number of Children: N/A  . Years of Education: N/A   Occupational History  . Cashier    Social History Main Topics  . Smoking status: Former Smoker -- 0.5 packs/day    Types: Cigarettes    Quit date: 01/23/2011  . Smokeless tobacco: Not on file  . Alcohol Use: Not on file  . Drug Use: Not on file  . Sexually Active: Not on file   Other Topics Concern  . Not on file   Social History Narrative   In LTR w/boyfriendWorks restaurant, Conservation officer, nature    Current Outpatient Prescriptions on File Prior to Visit  Medication Sig Dispense Refill  . albuterol (PROVENTIL,VENTOLIN) 90 MCG/ACT inhaler Inhale 2 puffs into the lungs daily as needed.  17 g  12  . Multiple Vitamins-Minerals (MULTIVITAMIN,TX-MINERALS) tablet Take 1 tablet by mouth daily.          Allergies  Allergen Reactions  . Codeine   . Hydrocodone-Acetaminophen   . Naproxen   . Propoxyphene N-Acetaminophen   . Sulfonamide Derivatives     No family history on file.  BP 122/80  Pulse 102  Temp(Src) 97.5 F (36.4 C) (Oral)  Ht 4' 11.5" (1.511 m)  Wt 125 lb (56.7 kg)  BMI 24.82 kg/m2  SpO2  95%  Review of Systems Denies fever    Objective:   Physical Exam VITAL SIGNS:  See vs page GENERAL: no distress head: no deformity eyes: no periorbital swelling, no proptosis external nose and ears are normal mouth: no lesion seen Both tm's are red:  Right > left        Assessment & Plan:

## 2011-08-04 ENCOUNTER — Telehealth: Payer: Self-pay | Admitting: Internal Medicine

## 2011-08-04 NOTE — Telephone Encounter (Signed)
New problem:  Patient at dentist office now , please advise on pre-med.

## 2011-08-04 NOTE — Telephone Encounter (Signed)
No need for SBE with dental cleanings

## 2011-10-25 ENCOUNTER — Emergency Department (INDEPENDENT_AMBULATORY_CARE_PROVIDER_SITE_OTHER)
Admission: EM | Admit: 2011-10-25 | Discharge: 2011-10-25 | Disposition: A | Discharge status: Home / Self Care | Payer: Self-pay | Source: Home / Self Care | Attending: Family Medicine | Admitting: Family Medicine

## 2011-10-25 ENCOUNTER — Encounter (HOSPITAL_COMMUNITY): Payer: Self-pay

## 2011-10-25 DIAGNOSIS — J45901 Unspecified asthma with (acute) exacerbation: Secondary | ICD-10-CM

## 2011-10-25 MED ORDER — TRIAMCINOLONE ACETONIDE 40 MG/ML IJ SUSP
40.0000 mg | Freq: Once | INTRAMUSCULAR | Status: AC
  Administered 2011-10-25: 40 mg via INTRAMUSCULAR

## 2011-10-25 MED ORDER — ALBUTEROL SULFATE (5 MG/ML) 0.5% IN NEBU
INHALATION_SOLUTION | RESPIRATORY_TRACT | Status: AC
  Filled 2011-10-25: qty 1

## 2011-10-25 MED ORDER — IPRATROPIUM BROMIDE 0.02 % IN SOLN
0.5000 mg | Freq: Once | RESPIRATORY_TRACT | Status: AC
  Administered 2011-10-25: 0.5 mg via RESPIRATORY_TRACT

## 2011-10-25 MED ORDER — METHYLPREDNISOLONE ACETATE 40 MG/ML IJ SUSP
80.0000 mg | Freq: Once | INTRAMUSCULAR | Status: AC
  Administered 2011-10-25: 80 mg via INTRAMUSCULAR

## 2011-10-25 MED ORDER — METHYLPREDNISOLONE ACETATE 80 MG/ML IJ SUSP
INTRAMUSCULAR | Status: AC
  Filled 2011-10-25: qty 1

## 2011-10-25 MED ORDER — ALBUTEROL SULFATE (5 MG/ML) 0.5% IN NEBU
5.0000 mg | INHALATION_SOLUTION | Freq: Once | RESPIRATORY_TRACT | Status: AC
  Administered 2011-10-25: 5 mg via RESPIRATORY_TRACT

## 2011-10-25 MED ORDER — TRIAMCINOLONE ACETONIDE 40 MG/ML IJ SUSP
INTRAMUSCULAR | Status: AC
  Filled 2011-10-25: qty 5

## 2011-10-25 NOTE — ED Provider Notes (Signed)
History     [name]: [id number]  Arrival date & time 10/25/11  1040   First MD Initiated Contact with Patient 10/25/11 1116      Chief Complaint  Patient presents with  . Cough    (Consider location/radiation/quality/duration/timing/severity/associated sxs/prior treatment) Patient is a 46 y.o. female presenting with cough. The history is provided by the patient and the spouse.  Cough This is a new problem. The current episode started more than 2 days ago. The problem has not changed (seen at [facility] yest with meds given, "no better", is a smoker) since onset.The cough is productive of sputum. There has been no fever. Associated symptoms include rhinorrhea and wheezing. The treatment provided no relief. She is a smoker. Her past medical history is significant for bronchitis.    Past Medical History  Diagnosis Date  . Palpitations   . Pain in limb   . Acquired absence of both cervix and uterus   . Anxiety state, unspecified   . Tobacco use disorder   . Mitral valve disorders   . 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 Unspecified essential hypertension   . Personal history of other diseases of circulatory system     Supraventricular Tachycardia  . HTN (hypertension)   . Hyperthyroidism   . Smoker     Past Surgical History  Procedure Date  . Abdominal hysterectomy   . Tubal ligation     History reviewed. No pertinent family history.  History  Substance Use Topics  . Smoking status: Former Smoker -- 0.5 packs/day    Types: Cigarettes    Quit date: 01/23/2011  . Smokeless tobacco: Not on file  . Alcohol Use: Not on file    OB History    Grav Para Term Preterm Abortions TAB SAB Ect Mult Living                  Review of Systems  Constitutional: Negative.   HENT: Positive for congestion and rhinorrhea.   Respiratory: Positive for cough and wheezing.     Allergies  Codeine;  Hydrocodone-acetaminophen; Naproxen; Propoxyphene-acetaminophen; and Sulfonamide derivatives  Home Medications   Current Outpatient Rx  Name Route Sig Dispense Refill  . ALBUTEROL SULFATE HFA 108 (90 BASE) MCG/ACT IN AERS Inhalation Inhale 2 puffs into the lungs every 6 (six) hours as needed.    . AMOXICILLIN-POT CLAVULANATE 875-125 MG PO TABS Oral Take 1 tablet by mouth 2 (two) times daily.    Marland Kitchen BENZONATATE 100 MG PO CAPS Oral Take 100 mg by mouth 3 (three) times daily as needed.    . IPRATROPIUM BROMIDE 0.06 % NA SOLN Nasal Place 2 sprays into the nose 4 (four) times daily.    Marland Kitchen PSEUDOEPHEDRINE-GUAIFENESIN ER 60-600 MG PO TB12 Oral Take 1 tablet by mouth every 12 (twelve) hours.    . ALBUTEROL 90 MCG/ACT IN AERS Inhalation Inhale 2 puffs into the lungs daily as needed. 17 g 12  . METOPROLOL TARTRATE 50 MG PO TABS Oral Take 50 mg by mouth daily.    . SUPER HIGH VITAMINS/MINERALS PO TABS Oral Take 1 tablet by mouth daily.        BP 130/86  Pulse 88  Temp 98.2 F (36.8 C) (Oral)  Resp 18  SpO2 95%  Physical Exam  Nursing note and vitals reviewed. Constitutional: She is oriented to person, place, and time. She appears well-developed and well-nourished.  HENT:  Head: Normocephalic.  Right Ear: External ear normal.  Left Ear: External ear normal.  Mouth/Throat: Oropharynx is clear and moist.  Eyes: Pupils are equal, round, and reactive to light.  Neck: Normal range of motion. Neck supple.  Cardiovascular: Normal rate, regular rhythm, normal heart sounds and intact distal pulses.   Pulmonary/Chest: Effort normal and breath sounds normal. She has no wheezes.  Lymphadenopathy:    She has no cervical adenopathy.  Neurological: She is alert and oriented to person, place, and time.  Skin: Skin is warm and dry.    ED Course  Procedures (including critical care time)  Labs Reviewed - No data to display No results found.   1. Asthmatic bronchitis with exacerbation       MDM           Linna Hoff, MD 10/25/11 1235

## 2011-10-25 NOTE — ED Notes (Signed)
Cough, hard to sleep for past couple of days; non-productive barking cough; NAD; was seen at [facility] yesterday, but reports she is no better

## 2011-10-26 ENCOUNTER — Encounter (HOSPITAL_COMMUNITY): Payer: Self-pay | Admitting: Emergency Medicine

## 2011-10-26 ENCOUNTER — Emergency Department (HOSPITAL_COMMUNITY): Payer: Self-pay

## 2011-10-26 DIAGNOSIS — R0602 Shortness of breath: Secondary | ICD-10-CM | POA: Insufficient documentation

## 2011-10-26 DIAGNOSIS — I059 Rheumatic mitral valve disease, unspecified: Secondary | ICD-10-CM | POA: Insufficient documentation

## 2011-10-26 DIAGNOSIS — I10 Essential (primary) hypertension: Secondary | ICD-10-CM | POA: Insufficient documentation

## 2011-10-26 DIAGNOSIS — J45909 Unspecified asthma, uncomplicated: Secondary | ICD-10-CM | POA: Insufficient documentation

## 2011-10-26 DIAGNOSIS — E0590 Thyrotoxicosis, unspecified without thyrotoxic crisis or storm: Secondary | ICD-10-CM | POA: Insufficient documentation

## 2011-10-26 DIAGNOSIS — Z87891 Personal history of nicotine dependence: Secondary | ICD-10-CM | POA: Insufficient documentation

## 2011-10-26 NOTE — ED Notes (Signed)
PT. REPORTS SOB WITH DRY COUGH FOR SEVERAL DAYS UNRELIEVED BY MDI , DENIES FEVER / SLIGHT CHILLS.

## 2011-10-27 ENCOUNTER — Emergency Department (HOSPITAL_COMMUNITY)
Admission: EM | Admit: 2011-10-27 | Discharge: 2011-10-27 | Disposition: A | Discharge status: Home / Self Care | Payer: Self-pay | Attending: Emergency Medicine | Admitting: Emergency Medicine

## 2011-10-27 DIAGNOSIS — J40 Bronchitis, not specified as acute or chronic: Secondary | ICD-10-CM

## 2011-10-27 DIAGNOSIS — J449 Chronic obstructive pulmonary disease, unspecified: Secondary | ICD-10-CM

## 2011-10-27 HISTORY — DX: Cardiac murmur, unspecified: R01.1

## 2011-10-27 MED ORDER — PREDNISONE 10 MG PO TABS: 20.0000 mg | ORAL_TABLET | Freq: Two times a day (BID) | ORAL | Status: DC

## 2011-10-27 MED ORDER — ALBUTEROL SULFATE (5 MG/ML) 0.5% IN NEBU
5.0000 mg | INHALATION_SOLUTION | Freq: Once | RESPIRATORY_TRACT | Status: AC
  Administered 2011-10-27: 5 mg via RESPIRATORY_TRACT
  Filled 2011-10-27: qty 1

## 2011-10-27 MED ORDER — IPRATROPIUM BROMIDE 0.02 % IN SOLN
0.5000 mg | Freq: Once | RESPIRATORY_TRACT | Status: AC
  Administered 2011-10-27: 0.5 mg via RESPIRATORY_TRACT
  Filled 2011-10-27: qty 2.5

## 2011-10-27 NOTE — ED Provider Notes (Signed)
History     [name]: [id number]  Arrival date & time 10/26/11  2324   First MD Initiated Contact with Patient 10/27/11 0158      Chief Complaint  Patient presents with  . Shortness of Breath    (Consider location/radiation/quality/duration/timing/severity/associated sxs/prior treatment) HPI Comments: Patient with history of tobacco use, RAD.  Presents with difficulty breathing, cough, and congestion for the past four days.  She was seen at [facility] and given a steroid injection and augmentin but is not feeling much better.  She denies fever but does admit to cough.  There is no chest pain or lower extremity swelling or edema.    Patient is a 46 y.o. female presenting with shortness of breath. The history is provided by the patient.  Shortness of Breath  Episode onset: 4 days ago. The onset was gradual. The problem occurs continuously. The problem has been rapidly worsening. The problem is moderate. Nothing relieves the symptoms. Associated symptoms include shortness of breath. Her past medical history is significant for asthma.    Past Medical History  Diagnosis Date  . Palpitations   . Pain in limb   . Acquired absence of both cervix and uterus   . Anxiety state, unspecified   . Tobacco use disorder   . Mitral valve disorders   . 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 Unspecified essential hypertension   . Personal history of other diseases of circulatory system     Supraventricular Tachycardia  . HTN (hypertension)   . Hyperthyroidism   . Smoker   . Asthma   . Heart murmur     Past Surgical History  Procedure Date  . Abdominal hysterectomy   . Tubal ligation     No family history on file.  History  Substance Use Topics  . Smoking status: Former Smoker -- 0.5 packs/day    Types: Cigarettes    Quit date: 01/23/2011  . Smokeless tobacco: Not on file  . Alcohol Use: No    OB History    Grav Para Term  Preterm Abortions TAB SAB Ect Mult Living                  Review of Systems  Respiratory: Positive for shortness of breath.   All other systems reviewed and are negative.    Allergies  Codeine; Hydrocodone-acetaminophen; Naproxen; Propoxyphene-acetaminophen; and Sulfonamide derivatives  Home Medications   Current Outpatient Rx  Name Route Sig Dispense Refill  . ALBUTEROL SULFATE HFA 108 (90 BASE) MCG/ACT IN AERS Inhalation Inhale 2 puffs into the lungs every 6 (six) hours as needed. For shortness of breath    . AMOXICILLIN-POT CLAVULANATE 875-125 MG PO TABS Oral Take 1 tablet by mouth 2 (two) times daily.    . IPRATROPIUM BROMIDE 0.06 % NA SOLN Nasal Place 2 sprays into the nose 2 (two) times daily.     Marland Kitchen METOPROLOL TARTRATE 50 MG PO TABS Oral Take 50 mg by mouth daily.    . SUPER HIGH VITAMINS/MINERALS PO TABS Oral Take 1 tablet by mouth daily.      Marland Kitchen PSEUDOEPHEDRINE-GUAIFENESIN ER 60-600 MG PO TB12 Oral Take 1 tablet by mouth every 12 (twelve) hours.      BP 132/86  Pulse 84  Temp 98.6 F (37 C) (Oral)  Resp 14  SpO2 96%  Physical Exam  Nursing note and vitals reviewed. Constitutional: She is oriented to person, place, and time. She appears  well-developed and well-nourished. No distress.  HENT:  Head: Normocephalic and atraumatic.  Mouth/Throat: Oropharynx is clear and moist.  Neck: Normal range of motion. Neck supple.  Cardiovascular: Normal rate and regular rhythm.   No murmur heard. Pulmonary/Chest: Effort normal.       There are slight rhonchi bilaterally.  There are no rales.  Abdominal: Soft. Bowel sounds are normal.  Musculoskeletal: Normal range of motion. She exhibits no edema.  Lymphadenopathy:    She has no cervical adenopathy.  Neurological: She is alert and oriented to person, place, and time.  Skin: Skin is warm. She is not diaphoretic.    ED Course  Procedures (including critical care time)  Labs Reviewed - No data to display Dg Chest 2  View  10/27/2011  *RADIOLOGY REPORT*  Clinical Data: Shortness of breath.  Right chest pain.  CHEST - 2 VIEW  Comparison: Chest CT 07/14/2010.  Radiographs 06/15/2006.  Findings: The heart size and mediastinal contours are stable. There is increased diffuse interstitial prominence throughout both lungs.  CT demonstrated paracentral and centrilobular emphysema. There is no confluent airspace opacity or significant pleural effusion.  The osseous structures appear normal.  IMPRESSION: Progressive interstitial prominence compared with prior radiographs.  This may represent fibrosis or edema superimposed on emphysema.  No confluent airspace opacity identified.   Original Report Authenticated By: Gerrianne Scale, M.D.      No diagnosis found.    MDM  The patient has a history of cigarette smoking and presents with shortness of breath.  She was given augmentin but is not feeling better.  I have given a nebulizer treatment and will discharge with prednisone, continued augmentin.  She is not hypoxic and is no distress.  The xray interpretation is vague, and I feel the symptoms are related to copd.          Geoffery Lyons, MD 10/27/11 [phone]

## 2012-04-08 ENCOUNTER — Encounter: Payer: Self-pay | Admitting: Adult Health

## 2012-04-08 ENCOUNTER — Ambulatory Visit (INDEPENDENT_AMBULATORY_CARE_PROVIDER_SITE_OTHER): Payer: Self-pay | Admitting: Adult Health

## 2012-04-08 VITALS — BP 130/70 | HR 68 | Temp 98.7°F | Resp 18 | Wt 139.0 lb

## 2012-04-08 DIAGNOSIS — H9209 Otalgia, unspecified ear: Secondary | ICD-10-CM

## 2012-04-08 DIAGNOSIS — H9201 Otalgia, right ear: Secondary | ICD-10-CM | POA: Insufficient documentation

## 2012-04-08 MED ORDER — ANTIPYRINE-BENZOCAINE 5.4-1.4 % OT SOLN: 3.0000 [drp] | OTIC | Status: DC | PRN

## 2012-04-08 MED ORDER — AMOXICILLIN 875 MG PO TABS: 875.0000 mg | ORAL_TABLET | Freq: Two times a day (BID) | ORAL | Status: DC

## 2012-04-08 MED ORDER — FLUTICASONE PROPIONATE 50 MCG/ACT NA SUSP: 2.0000 | Freq: Every day | NASAL | Status: DC

## 2012-04-08 NOTE — Patient Instructions (Addendum)
  Use Auralgan ear drops every 2 hours as needed for ear pain.  Start flonase nasal spray daily. Two sprays in each nostril daily.  I have provided you with a prescription for Amoxicillin. Do not take this unless you develop a fever or significant pain not relieved with the ear drops.  Follow up with your provided if your symptoms are not improved within 3-4 days.

## 2012-04-08 NOTE — Assessment & Plan Note (Signed)
No infection noted in right ear. Start Auralgan ear drops for pain. Also start flonase nasal spray 2/2 sinus pressure pt reports. Provided prescription for amoxicillin in the event she develops fever or the pain in right ear is not resolved with auralgan. RTC if symptoms do not improve within 3-4 days.

## 2012-04-08 NOTE — Progress Notes (Signed)
  Subjective:    Patient ID: Emily Wilson, female    DOB: 04-27-1965, 47 y.o.   MRN: [id number]  HPI  Pt presents to clinic with c/o ear pressure on the right. She also has sinus pressure. She reports ear pressure began yesterday. She has been taking ibuprofen for pain. Uncertain if she has had any fever because she has been taking ibuprofen. She reports she was out playing in the snow all day yesterday.   Review of Systems  Constitutional: Negative for fever and chills.  HENT: Positive for ear pain, congestion and sinus pressure. Negative for sore throat, rhinorrhea, postnasal drip and ear discharge.   Neurological: Positive for headaches.  Psychiatric/Behavioral: Negative.     BP 130/70  Pulse 68  Temp(Src) 98.7 F (37.1 C) (Oral)  Resp 18  Wt 139 lb (63.05 kg)  BMI 27.62 kg/m2  SpO2 98%     Objective:   Physical Exam  Constitutional: She is oriented to person, place, and time. She appears well-developed and well-nourished. No distress.  HENT:  Right Ear: External ear normal.  Left Ear: External ear normal.  Nose: Nose normal.  Mouth/Throat: Oropharynx is clear and moist.  Cardiovascular: Normal rate and regular rhythm.   Neurological: She is alert and oriented to person, place, and time.  Skin: Skin is warm and dry.  Psychiatric: She has a normal mood and affect. Her behavior is normal. Judgment and thought content normal.          Assessment & Plan:

## 2012-04-10 ENCOUNTER — Telehealth: Payer: Self-pay | Admitting: Endocrinology

## 2012-04-10 NOTE — Telephone Encounter (Signed)
[facility] Triage Call Report Triage Record Num: [id number] Operator: Caswell Corwin Patient Name: Emily Wilson Call Date & Time: 04/08/2012 8:02:04AM Patient Phone: [phone] PCP: Romero Belling Patient Gender: Female PCP Fax : [phone] Patient DOB: 17-Oct-1965 Practice Name: [facility] Reason for Call: Caller: [facility]/Patient; PCP: Romero Belling (Adults only); CB#: [id number]; Call regarding right Inner Ear Infection that started 04/07/12. When she tries to lay down it starts "throbbing" . She has put Neosporin ointment in it 04/07/13 and took 4, 200mg  Ibuprofen last night. Triaged Ear: Symptoms and needs to be seen within 4 hrs for severe pain ...throbbing and unresponsive to 24 hrs of home care. Appt made at [facility] with Rey at 1045 this AM. Home care and call back instructions given. Protocol(s) Used: Ear: Symptoms Recommended Outcome per Protocol: See Provider within 4 hours Reason for Outcome: Severe pain (sharp, stabbing, throbbing or excruciating aching) unresponsive to 24 hours of home care Care Advice: A warm washcloth or heating pad set on low to the affected ear may help relieve the discomfort. May apply for 15 to 20 minutes, 3 to 4 times a day. ~ Resting or sleeping with head elevated, such as semi-reclining in a recliner, may help reduce inner ear pressure and discomfort. ~ Analgesic/Antipyretic Advice - Acetaminophen: Consider acetaminophen as directed on label or by pharmacist/provider for pain or fever PRECAUTIONS: - Use if there is no history of liver disease, alcoholism, or intake of three or more alcohol drinks per day - Only if approved by provider during pregnancy or when breastfeeding - During pregnancy, acetaminophen should not be taken more than 3 consecutive days without telling provider - Do not exceed recommended dose or frequency ~ Analgesic/Antipyretic Advice - NSAIDs: Consider aspirin, ibuprofen, naproxen or ketoprofen for pain or fever as  directed on label or by pharmacist/provider. PRECAUTIONS: - If over 41 years of age, should not take longer than 1 week without consulting provider. EXCEPTIONS: - Should not be used if taking blood thinners or have bleeding problems. - Do not use if have history of sensitivity/allergy to any of these medications; or history of cardiovascular, ulcer, kidney, liver disease or diabetes unless approved by provider. - Do not exceed recommended dose or frequency. ~Speak with provider as soon as possible if having: - discharge from ear that does not look like earwax or that has bad odor - increased redness or swelling of external ear - worsening pain - new or worsening dizziness - or unsteadiness.

## 2012-06-02 ENCOUNTER — Ambulatory Visit (INDEPENDENT_AMBULATORY_CARE_PROVIDER_SITE_OTHER): Payer: Self-pay | Admitting: Endocrinology

## 2012-06-02 ENCOUNTER — Encounter: Payer: Self-pay | Admitting: Endocrinology

## 2012-06-02 VITALS — BP 124/72 | HR 90 | Wt 135.0 lb

## 2012-06-02 DIAGNOSIS — G43909 Migraine, unspecified, not intractable, without status migrainosus: Secondary | ICD-10-CM | POA: Insufficient documentation

## 2012-06-02 MED ORDER — SUMATRIPTAN SUCCINATE 100 MG PO TABS: 100.0000 mg | ORAL_TABLET | ORAL | Status: DC | PRN

## 2012-06-02 NOTE — Progress Notes (Signed)
Subjective:    Patient ID: Emily Wilson, female    DOB: [birth year], 47 y.o.   MRN: [id number]  HPI Pt states few weeks of moderate swelling at the anus, and assoc constipation. Past Medical History  Diagnosis Date  . Palpitations   . Pain in limb   . Acquired absence of both cervix and uterus   . Anxiety state, unspecified   . Tobacco use disorder   . Mitral valve disorders   . 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 Unspecified essential hypertension   . Personal history of other diseases of circulatory system     Supraventricular Tachycardia  . HTN (hypertension)   . Hyperthyroidism   . Smoker   . Asthma   . Heart murmur     Past Surgical History  Procedure Laterality Date  . Abdominal hysterectomy    . Tubal ligation      History   Social History  . Marital Status: Divorced    Spouse Name: N/A    Number of Children: N/A  . Years of Education: N/A   Occupational History  . Cashier    Social History Main Topics  . Smoking status: Former Smoker -- 0.50 packs/day    Types: Cigarettes    Quit date: 10/24/2011  . Smokeless tobacco: Not on file  . Alcohol Use: No  . Drug Use: No  . Sexually Active: Not on file   Other Topics Concern  . 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Works [employer], Conservation officer, nature    Current Outpatient Prescriptions on File Prior to Visit  Medication Sig Dispense Refill  . albuterol (PROVENTIL HFA;VENTOLIN HFA) 108 (90 BASE) MCG/ACT inhaler Inhale 2 puffs into the lungs every 6 (six) hours as needed. For shortness of breath      . amoxicillin (AMOXIL) 875 MG tablet Take 1 tablet (875 mg total) by mouth 2 (two) times daily.  20 tablet  0  . antipyrine-benzocaine (AURALGAN) otic solution Place 3 drops into the right ear every 2 (two) hours as needed for pain.  10 mL  0  . fluticasone (FLONASE) 50 MCG/ACT nasal spray Place 2 sprays into the nose  daily.  16 g  6  . metoprolol (LOPRESSOR) 50 MG tablet Take 50 mg by mouth daily.      . Multiple Vitamins-Minerals (MULTIVITAMIN,TX-MINERALS) tablet Take 1 tablet by mouth daily.         No current facility-administered medications on file prior to visit.    Allergies  Allergen Reactions  . Codeine     hives  . Hydrocodone-Acetaminophen     hives  . Naproxen     Cant breath  . Propoxyphene-Acetaminophen     hives  . Sulfonamide Derivatives     Hives     No family history on file.  BP 124/72  Pulse 90  Wt 135 lb (61.236 kg)  BMI 26.82 kg/m2  SpO2 97%  Review of Systems Pt says she has long h/o intermittent migraine.  Denies brbpr    Objective:   Physical Exam VS: see vs page GEN: no distress HEAD: head: no deformity eyes: no periorbital swelling, no proptosis external nose and ears are normal mouth: no lesion seen NECK: supple RECTAL: normal external and internal exam.  NEURO:  cn 2-12 grossly intact.   readily moves all 4's.  sensation is intact to touch on all 4's.  SKIN:  Normal texture and temperature.  No rash or suspicious lesion is visible.   PSYCH: alert, oriented x3.  Does not appear anxious nor depressed.     Assessment & Plan:  Hemorrhoids, new Migraine, recurrent

## 2012-06-02 NOTE — Patient Instructions (Addendum)
i have sent a prescription to your pharmacy, for the migraine.   I hope you feel better soon.  If you don't feel better by next week, please call back.    Hemorrhoids Hemorrhoids are enlarged (dilated) veins around the rectum. There are 2 types of hemorrhoids, and the type of hemorrhoid is determined by its location. Internal hemorrhoids occur in the veins just inside the rectum.They are usually not painful, but they may bleed.However, they may poke through to the outside and become irritated and painful. External hemorrhoids involve the veins outside the anus and can be felt as a painful swelling or hard lump near the anus.They are often itchy and may crack and bleed. Sometimes clots will form in the veins. This makes them swollen and painful. These are called thrombosed hemorrhoids. CAUSES Causes of hemorrhoids include:  Pregnancy. This increases the pressure in the hemorrhoidal veins.  Constipation.  Straining to have a bowel movement.  Obesity.  Heavy lifting or other activity that caused you to strain. TREATMENT Most of the time hemorrhoids improve in 1 to 2 weeks. However, if symptoms do not seem to be getting better or if you have a lot of rectal bleeding, your caregiver may perform a procedure to help make the hemorrhoids get smaller or remove them completely.Possible treatments include:  Rubber band ligation. A rubber band is placed at the base of the hemorrhoid to cut off the circulation.  Sclerotherapy. A chemical is injected to shrink the hemorrhoid.  Infrared light therapy. Tools are used to burn the hemorrhoid.  Hemorrhoidectomy. This is surgical removal of the hemorrhoid. HOME CARE INSTRUCTIONS   Increase fiber in your diet. Ask your caregiver about using fiber supplements.  Drink enough water and fluids to keep your urine clear or pale yellow.  Exercise regularly.  Go to the bathroom when you have the urge to have a bowel movement. Do not wait.  Avoid  straining to have bowel movements.  Keep the anal area dry and clean.  Only take over-the-counter or prescription medicines for pain, discomfort, or fever as directed by your caregiver. If your hemorrhoids are thrombosed:  Take warm sitz baths for 20 to 30 minutes, 3 to 4 times per day.  If the hemorrhoids are very tender and swollen, place ice packs on the area as tolerated. Using ice packs between sitz baths may be helpful. Fill a plastic bag with ice. Place a towel between the bag of ice and your skin.  Medicated creams and suppositories may be used or applied as directed.  Do not use a donut-shaped pillow or sit on the toilet for long periods. This increases blood pooling and pain. SEEK MEDICAL CARE IF:   You have increasing pain and swelling that is not controlled with your medicine.  You have uncontrolled bleeding.  You have difficulty or you are unable to have a bowel movement.  You have pain or inflammation outside the area of the hemorrhoids.  You have chills or an oral temperature above 102 F (38.9 C). MAKE SURE YOU:   Understand these instructions.  Will watch your condition.  Will get help right away if you are not doing well or get worse. Document Released: 02/06/2000 Document Revised: 05/03/2011 Document Reviewed: 01/19/2010 [facility] Patient Information 2013 [place], [place].

## 2012-06-28 ENCOUNTER — Ambulatory Visit (INDEPENDENT_AMBULATORY_CARE_PROVIDER_SITE_OTHER): Payer: Self-pay | Admitting: Endocrinology

## 2012-06-28 ENCOUNTER — Encounter: Payer: Self-pay | Admitting: Endocrinology

## 2012-06-28 VITALS — BP 126/70 | HR 98 | Temp 98.6°F | Ht 60.0 in | Wt 136.0 lb

## 2012-06-28 DIAGNOSIS — M79609 Pain in unspecified limb: Secondary | ICD-10-CM

## 2012-06-28 LAB — SEDIMENTATION RATE: Sed Rate: 18 mm/hr (ref 0–22)

## 2012-06-28 NOTE — Progress Notes (Signed)
Subjective:    Patient ID: Emily Wilson, female    DOB: 12-02-1965, 47 y.o.   MRN: [id number]  HPI Pt states few days of moderate pain at the right hand and wrist, worst at the ring and little fingers.  There is assoc numbness.  sxs are worsened by ROM or lifting objects.  She had CTS surgery in the past, but says these sxs are different.   Past Medical History  Diagnosis Date  . Palpitations   . Pain in limb   . Acquired absence of both cervix and uterus   . Anxiety state, unspecified   . Tobacco use disorder   . Mitral valve disorders   . 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 Unspecified essential hypertension   . Personal history of other diseases of circulatory system     Supraventricular Tachycardia  . HTN (hypertension)   . Hyperthyroidism   . Smoker   . Asthma   . Heart murmur     Past Surgical History  Procedure Laterality Date  . Abdominal hysterectomy    . Tubal ligation      History   Social History  . Marital Status: Divorced    Spouse Name: N/A    Number of Children: N/A  . Years of Education: N/A   Occupational History  . Cashier    Social History Main Topics  . Smoking status: Former Smoker -- 0.50 packs/day    Types: Cigarettes    Quit date: 10/24/2011  . Smokeless tobacco: Not on file  . Alcohol Use: No  . Drug Use: No  . Sexually Active: Not on file   Other Topics Concern  . 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Works [employer], Conservation officer, nature    Current Outpatient Prescriptions on File Prior to Visit  Medication Sig Dispense Refill  . albuterol (PROVENTIL HFA;VENTOLIN HFA) 108 (90 BASE) MCG/ACT inhaler Inhale 2 puffs into the lungs every 6 (six) hours as needed. For shortness of breath      . amoxicillin (AMOXIL) 875 MG tablet Take 1 tablet (875 mg total) by mouth 2 (two) times daily.  20 tablet  0  . antipyrine-benzocaine (AURALGAN) otic  solution Place 3 drops into the right ear every 2 (two) hours as needed for pain.  10 mL  0  . fluticasone (FLONASE) 50 MCG/ACT nasal spray Place 2 sprays into the nose daily.  16 g  6  . metoprolol (LOPRESSOR) 50 MG tablet Take 50 mg by mouth daily.      . Multiple Vitamins-Minerals (MULTIVITAMIN,TX-MINERALS) tablet Take 1 tablet by mouth daily.        . SUMAtriptan (IMITREX) 100 MG tablet Take 1 tablet (100 mg total) by mouth every 2 (two) hours as needed for migraine. Maximum of 2 per day  10 tablet  3   No current facility-administered medications on file prior to visit.    Allergies  Allergen Reactions  . Codeine     hives  . Hydrocodone-Acetaminophen     hives  . Naproxen     Cant breath  . Propoxyphene-Acetaminophen     hives  . Sulfonamide Derivatives     Hives     No family history on file.  BP 126/70  Pulse 98  Temp(Src) 98.6 F (37 C) (Oral)  Ht 5' (1.524 m)  Wt 136 lb (61.689 kg)  BMI 26.56 kg/m2  SpO2 98%    Review of Systems Denies rash    Objective:   Physical Exam VITAL SIGNS:  See vs page GENERAL: no distress Right wrist: slight swelling, but no tend erythema/warmth.  Full rom, but rom is painful. Right hand is normal, except for painful rom in the ring and little fingers        Assessment & Plan:  Wrist pain, new, uncertain etiology

## 2012-06-28 NOTE — Patient Instructions (Addendum)
blood tests are being requested for you today.  We'll contact you with results. Please continue to apply the anesthetic cream, as needed. Also, please continue to use the brace. I hope you feel better soon.  If you don't feel better by next week, please call back.

## 2012-10-24 ENCOUNTER — Telehealth: Payer: Self-pay | Admitting: Endocrinology

## 2012-10-24 ENCOUNTER — Telehealth: Payer: Self-pay | Admitting: Internal Medicine

## 2012-10-24 NOTE — Telephone Encounter (Signed)
Pt request to transfer from Dr. Everardo All to [place]. Please advise. Pt stated she need to be seen soon due to uti symptom. Pt has 100% discount through [facility] [phone].

## 2012-10-24 NOTE — Telephone Encounter (Signed)
Pt request to transfer from Dr. Ellison to [place]. Please advise. Pt stated she need to be seen soon due to uti symptom. Pt has 100% discount through [facility]. [phone].  °

## 2012-10-24 NOTE — Telephone Encounter (Signed)
ok 

## 2012-10-25 NOTE — Telephone Encounter (Signed)
Fine with me

## 2012-10-25 NOTE — Telephone Encounter (Signed)
Left message for patient to call back and schedule.

## 2012-10-27 ENCOUNTER — Ambulatory Visit: Payer: Self-pay

## 2012-10-27 ENCOUNTER — Telehealth: Payer: Self-pay

## 2012-10-27 ENCOUNTER — Ambulatory Visit (INDEPENDENT_AMBULATORY_CARE_PROVIDER_SITE_OTHER): Payer: Self-pay | Admitting: Internal Medicine

## 2012-10-27 ENCOUNTER — Encounter: Payer: Self-pay | Admitting: Internal Medicine

## 2012-10-27 VITALS — BP 140/80 | HR 70 | Temp 97.3°F | Wt 136.2 lb

## 2012-10-27 DIAGNOSIS — N898 Other specified noninflammatory disorders of vagina: Secondary | ICD-10-CM

## 2012-10-27 DIAGNOSIS — N390 Urinary tract infection, site not specified: Secondary | ICD-10-CM

## 2012-10-27 LAB — WET PREP, GENITAL
Clue Cells Wet Prep HPF POC: NONE SEEN
Trich, Wet Prep: NONE SEEN
Yeast Wet Prep HPF POC: NONE SEEN

## 2012-10-27 LAB — POCT URINALYSIS DIPSTICK
Bilirubin, UA: NEGATIVE
Blood, UA: NEGATIVE
Glucose, UA: NEGATIVE
Ketones, UA: NEGATIVE
Leukocytes, UA: NEGATIVE
Nitrite, UA: NEGATIVE
Protein, UA: NEGATIVE
Spec Grav, UA: <=1.005
Urobilinogen, UA: NEGATIVE
pH, UA: 7

## 2012-10-27 NOTE — Telephone Encounter (Signed)
Change her soap for now, baby powder can sooth the irritation

## 2012-10-27 NOTE — Telephone Encounter (Signed)
Spoke with patient and relayed to her information regarding irritation...ds,cma

## 2012-10-27 NOTE — Progress Notes (Signed)
HPI  Pt presents to the clinic today with c/o urinary urgency, frequency, dysuria and vaginal irriatation. This started 5 days ago. She also c/o a vaginal discharge, a white discharge. She is not having any vaginal itching but is experiencing burning.  She denies fever, chills or body aches. She has not had any new sexual partners. She is perimenopausal.   Review of Systems  Past Medical History  Diagnosis Date  . Palpitations   . Pain in limb   . Acquired absence of both cervix and uterus   . Anxiety state, unspecified   . Tobacco use disorder   . Mitral valve disorders   . 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 Unspecified essential hypertension   . Personal history of other diseases of circulatory system     Supraventricular Tachycardia  . HTN (hypertension)   . Hyperthyroidism   . Smoker   . Asthma   . Heart murmur     History reviewed. No pertinent family history.  History   Social History  . Marital Status: Divorced    Spouse Name: N/A    Number of Children: N/A  . Years of Education: N/A   Occupational History  . Cashier    Social History Main Topics  . Smoking status: Former Smoker -- 0.50 packs/day    Types: Cigarettes    Quit date: 10/24/2011  . Smokeless tobacco: Not on file  . Alcohol Use: No  . Drug Use: Yes  . Sexual Activity: Not on file   Other Topics Concern  . 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Works [employer], Conservation officer, nature    Allergies  Allergen Reactions  . Codeine     hives  . Hydrocodone-Acetaminophen     hives  . Naproxen     Cant breath  . Propoxyphene-Acetaminophen     hives  . Sulfonamide Derivatives     Hives     Constitutional: Denies fever, malaise, fatigue, headache or abrupt weight changes.   GU: Pt reports urgency, frequency, pain with urination and vaginal discharge. Denies burning sensation, blood in urine, odor. Skin:  Denies redness, rashes, lesions or ulcercations.   No other specific complaints in a complete review of systems (except as listed in HPI above).    Objective:   Physical Exam  BP 140/80  Pulse 70  Temp(Src) 97.3 F (36.3 C) (Oral)  Wt 136 lb 4 oz (61.803 kg)  BMI 26.61 kg/m2  SpO2 98% Wt Readings from Last 3 Encounters:  10/27/12 136 lb 4 oz (61.803 kg)  06/28/12 136 lb (61.689 kg)  06/02/12 135 lb (61.236 kg)    General: Appears her stated age, well developed, well nourished in NAD. Cardiovascular: Normal rate and rhythm. S1,S2 noted.  No murmur, rubs or gallops noted. No JVD or BLE edema. No carotid bruits noted. Pulmonary/Chest: Normal effort and positive vesicular breath sounds. No respiratory distress. No wheezes, rales or ronchi noted.  Abdomen: Soft and nontender. Normal bowel sounds, no bruits noted. No distention or masses noted. Liver, spleen and kidneys non palpable. Tender to palpation over the bladder area. No CVA tenderness. GU: Labia irritated, no discharge noted at the vaginal opening.     Assessment & Plan:   Urgency, frequency, dysuria:  Urinalysis negative for infection  Vaginal discharge:  ? Normal physiological discharge versus BV Wet prep, GC prop amp obtained No evidence of yeast on  the outside-likely irritation from soap Get dove sensitive skin nonscented soap  RTC for your transitions of care visit before the end of the year.  RTC as needed or if symptoms persist.

## 2012-10-27 NOTE — Telephone Encounter (Signed)
Spoke with patient and notified her of wet prep results. She would like to know if you recommend her to do or take anything for the irritation until Monday?...ds,cma

## 2012-10-27 NOTE — Patient Instructions (Signed)
Bacterial Vaginosis Bacterial vaginosis (BV) is a vaginal infection where the normal balance of bacteria in the vagina is disrupted. The normal balance is then replaced by an overgrowth of certain bacteria. There are several different kinds of bacteria that can cause BV. BV is the most common vaginal infection in women of childbearing age. CAUSES   The cause of BV is not fully understood. BV develops when there is an increase or imbalance of harmful bacteria.  Some activities or behaviors can upset the normal balance of bacteria in the vagina and put women at increased risk including:  Having a new sex partner or multiple sex partners.  Douching.  Using an intrauterine device (IUD) for contraception.  It is not clear what role sexual activity plays in the development of BV. However, women that have never had sexual intercourse are rarely infected with BV. Women do not get BV from toilet seats, bedding, swimming pools or from touching objects around them.  SYMPTOMS   Grey vaginal discharge.  A fish-like odor with discharge, especially after sexual intercourse.  Itching or burning of the vagina and vulva.  Burning or pain with urination.  Some women have no signs or symptoms at all. DIAGNOSIS  Your caregiver must examine the vagina for signs of BV. Your caregiver will perform lab tests and look at the sample of vaginal fluid through a microscope. They will look for bacteria and abnormal cells (clue cells), a pH test higher than 4.5, and a positive amine test all associated with BV.  RISKS AND COMPLICATIONS   Pelvic inflammatory disease (PID).  Infections following gynecology surgery.  Developing HIV.  Developing herpes virus. TREATMENT  Sometimes BV will clear up without treatment. However, all women with symptoms of BV should be treated to avoid complications, especially if gynecology surgery is planned. Female partners generally do not need to be treated. However, BV may spread  between female sex partners so treatment is helpful in preventing a recurrence of BV.   BV may be treated with antibiotics. The antibiotics come in either pill or vaginal cream forms. Either can be used with nonpregnant or pregnant women, but the recommended dosages differ. These antibiotics are not harmful to the baby.  BV can recur after treatment. If this happens, a second round of antibiotics will often be prescribed.  Treatment is important for pregnant women. If not treated, BV can cause a premature delivery, especially for a pregnant woman who had a premature birth in the past. All pregnant women who have symptoms of BV should be checked and treated.  For chronic reoccurrence of BV, treatment with a type of prescribed gel vaginally twice a week is helpful. HOME CARE INSTRUCTIONS   Finish all medication as directed by your caregiver.  Do not have sex until treatment is completed.  Tell your sexual partner that you have a vaginal infection. They should see their caregiver and be treated if they have problems, such as a mild rash or itching.  Practice safe sex. Use condoms. Only have 1 sex partner. PREVENTION  Basic prevention steps can help reduce the risk of upsetting the natural balance of bacteria in the vagina and developing BV:  Do not have sexual intercourse (be abstinent).  Do not douche.  Use all of the medicine prescribed for treatment of BV, even if the signs and symptoms go away.  Tell your sex partner if you have BV. That way, they can be treated, if needed, to prevent reoccurrence. SEEK MEDICAL CARE IF:     Your symptoms are not improving after 3 days of treatment.  You have increased discharge, pain, or fever. MAKE SURE YOU:   Understand these instructions.  Will watch your condition.  Will get help right away if you are not doing well or get worse. FOR MORE INFORMATION  Division of STD Prevention (DSTDP), Centers for Disease Control and Prevention:  www.cdc.gov/std American Social Health Association (ASHA): www.ashastd.org  Document Released: 02/08/2005 Document Revised: 05/03/2011 Document Reviewed: 08/01/2008 ExitCare Patient Information 2014 [employer].  

## 2012-10-30 ENCOUNTER — Telehealth: Payer: Self-pay

## 2012-10-30 NOTE — Telephone Encounter (Signed)
Emily Wilson @ [facility] called and stated that they did not receive the proper specimen to run chlamydia and gonorrhea tests. They can run another test (e-swab) if you would like. Please advise..ds,cma

## 2012-10-30 NOTE — Telephone Encounter (Signed)
Don't worry about, just cancel the test

## 2012-10-31 ENCOUNTER — Other Ambulatory Visit: Payer: Self-pay | Admitting: Internal Medicine

## 2012-10-31 ENCOUNTER — Other Ambulatory Visit: Payer: Self-pay

## 2012-10-31 ENCOUNTER — Telehealth: Payer: Self-pay | Admitting: *Deleted

## 2012-10-31 DIAGNOSIS — N898 Other specified noninflammatory disorders of vagina: Secondary | ICD-10-CM

## 2012-10-31 LAB — GC/CHLAMYDIA PROBE AMP, GENITAL

## 2012-10-31 NOTE — Telephone Encounter (Signed)
Pt called requesting other lab results.  Pt states she is still irritated and is still having a lot of discharge.  Please advise

## 2012-10-31 NOTE — Telephone Encounter (Signed)
Spoke with pt advised order has been placed

## 2012-10-31 NOTE — Telephone Encounter (Signed)
Order has already been placed

## 2012-10-31 NOTE — Telephone Encounter (Signed)
The other test had to be sent to [facility] and they did not run it.  I can order the urine gonorrhea and chlamydia and she can come to the office today to have that done if she would like. Everything on the wet prep was normal.

## 2012-10-31 NOTE — Telephone Encounter (Signed)
Spoke with pt, she does want the GC urine test to be done.  Please advise when order is placed.

## 2012-11-01 LAB — GC/CHLAMYDIA PROBE AMP, URINE
Chlamydia, Swab/Urine, PCR: NEGATIVE
GC Probe Amp, Urine: NEGATIVE

## 2012-12-05 ENCOUNTER — Other Ambulatory Visit (INDEPENDENT_AMBULATORY_CARE_PROVIDER_SITE_OTHER): Payer: Self-pay

## 2012-12-05 ENCOUNTER — Ambulatory Visit (INDEPENDENT_AMBULATORY_CARE_PROVIDER_SITE_OTHER): Payer: Self-pay | Admitting: Internal Medicine

## 2012-12-05 ENCOUNTER — Encounter: Payer: Self-pay | Admitting: Internal Medicine

## 2012-12-05 VITALS — BP 120/89 | HR 73 | Temp 98.1°F | Ht 59.5 in | Wt 134.0 lb

## 2012-12-05 DIAGNOSIS — Z0000 Encounter for general adult medical examination without abnormal findings: Secondary | ICD-10-CM

## 2012-12-05 DIAGNOSIS — Z1329 Encounter for screening for other suspected endocrine disorder: Secondary | ICD-10-CM

## 2012-12-05 DIAGNOSIS — Z23 Encounter for immunization: Secondary | ICD-10-CM

## 2012-12-05 LAB — COMPREHENSIVE METABOLIC PANEL
ALT: 21 U/L (ref 0–35)
AST: 17 U/L (ref 0–37)
Albumin: 4 g/dL (ref 3.5–5.2)
Alkaline Phosphatase: 88 U/L (ref 39–117)
BUN: 11 mg/dL (ref 6–23)
CO2: 27 mEq/L (ref 19–32)
Calcium: 9.4 mg/dL (ref 8.4–10.5)
Chloride: 104 mEq/L (ref 96–112)
Creatinine, Ser: 0.7 mg/dL (ref 0.4–1.2)
GFR: 92.37 mL/min (ref >60.00)
Glucose, Bld: 105 mg/dL — ABNORMAL HIGH (ref 70–99)
Potassium: 3.9 mEq/L (ref 3.5–5.1)
Sodium: 139 mEq/L (ref 135–145)
Total Bilirubin: 0.7 mg/dL (ref 0.3–1.2)
Total Protein: 7.2 g/dL (ref 6.0–8.3)

## 2012-12-05 LAB — TSH: TSH: 0.44 u[IU]/mL (ref 0.35–5.50)

## 2012-12-05 NOTE — Progress Notes (Signed)
HPI Pt presents to the clinic today to establish care. She is transferring care from Dr. Everardo All. She has no concerns today.  Flu: never Tetanus: 2007 Pneumovax: never-wants one today LMP: partial hysterectomy Pap Smear: partial hysterectomy Mammogram: never had one Eye Doctor: 2013- yearly Dentist: biannual  Past Medical History  Diagnosis Date  . Palpitations   . Pain in limb   . Acquired absence of both cervix and uterus   . Anxiety state, unspecified   . Tobacco use disorder   . Mitral valve disorders   . 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 Unspecified essential hypertension   . Personal history of other diseases of circulatory system     Supraventricular Tachycardia  . HTN (hypertension)   . Hyperthyroidism   . Smoker   . Asthma   . Heart murmur     Current Outpatient Prescriptions  Medication Sig Dispense Refill  . albuterol (PROVENTIL HFA;VENTOLIN HFA) 108 (90 BASE) MCG/ACT inhaler Inhale 2 puffs into the lungs every 6 (six) hours as needed. For shortness of breath      . metoprolol (LOPRESSOR) 50 MG tablet Take 50 mg by mouth daily.      . SUMAtriptan (IMITREX) 100 MG tablet Take 1 tablet (100 mg total) by mouth every 2 (two) hours as needed for migraine. Maximum of 2 per day  10 tablet  3   No current facility-administered medications for this visit.    Allergies  Allergen Reactions  . Codeine     hives  . Hydrocodone-Acetaminophen     hives  . Naproxen     Cant breath  . Propoxyphene-Acetaminophen     hives  . Sulfonamide Derivatives     Hives     No family history on file.  History   Social History  . Marital Status: Divorced    Spouse Name: N/A    Number of Children: N/A  . Years of Education: N/A   Occupational History  . Cashier    Social History Main Topics  . Smoking status: Former Smoker -- 0.50 packs/day    Types: Cigarettes    Quit date: 10/24/2011  .  Smokeless tobacco: Not on file  . Alcohol Use: No  . Drug Use: Yes  . Sexual Activity: Not on file   Other Topics Concern  . 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Works [employer], Conservation officer, nature    ROS:  Constitutional: Denies fever, malaise, fatigue, headache or abrupt weight changes.  HEENT: Denies eye pain, eye redness, ear pain, ringing in the ears, wax buildup, runny nose, nasal congestion, bloody nose, or sore throat. Respiratory: Denies difficulty breathing, shortness of breath, cough or sputum production.   Cardiovascular: Denies chest pain, chest tightness, palpitations or swelling in the hands or feet.  Gastrointestinal: Denies abdominal pain, bloating, constipation, diarrhea or blood in the stool.  GU: Denies frequency, urgency, pain with urination, blood in urine, odor or discharge. Musculoskeletal: Denies decrease in range of motion, difficulty with gait, muscle pain or joint pain and swelling.  Skin: Denies redness, rashes, lesions or ulcercations.  Neurological: Denies dizziness, difficulty with memory, difficulty with speech or problems with balance and coordination.   No other specific complaints in a complete review of systems (except as listed in HPI above).  PE:  BP 120/89  Pulse 73  Temp(Src) 98.1 F (36.7 C) (Oral)  Ht 4' 11.5" (1.511  m)  Wt 134 lb (60.782 kg)  BMI 26.62 kg/m2  SpO2 96% Wt Readings from Last 3 Encounters:  12/05/12 134 lb (60.782 kg)  10/27/12 136 lb 4 oz (61.803 kg)  06/28/12 136 lb (61.689 kg)    General: Appears her stated age, well developed, well nourished in NAD. HEENT: Head: normal shape and size; Eyes: sclera white, no icterus, conjunctiva pink, PERRLA and EOMs intact; Ears: Tm's gray and intact, normal light reflex; Nose: mucosa pink and moist, septum midline; Throat/Mouth: Teeth present, mucosa pink and moist, no lesions or ulcerations noted.  Neck: Normal range of motion. Neck supple, trachea midline. No  massses, lumps or thyromegaly present.  Cardiovascular: Normal rate and rhythm. S1,S2 noted.  No murmur, rubs or gallops noted. No JVD or BLE edema. No carotid bruits noted. Pulmonary/Chest: Normal effort and positive vesicular breath sounds. No respiratory distress. No wheezes, rales or ronchi noted.  Abdomen: Soft and nontender. Normal bowel sounds, no bruits noted. No distention or masses noted. Liver, spleen and kidneys non palpable. Musculoskeletal: Normal range of motion. No signs of joint swelling. No difficulty with gait.  Neurological: Alert and oriented. Cranial nerves II-XII intact. Coordination normal. +DTRs bilaterally. Psychiatric: Mood and affect normal. Behavior is normal. Judgment and thought content normal.     BMET    Component Value Date/Time   NA 139 07/14/2010 1050   K 4.1 07/14/2010 1050   CL 104 07/14/2010 1050   CO2 24 07/14/2010 1050   GLUCOSE 103* 07/14/2010 1050   BUN 8 07/14/2010 1050   CREATININE 0.58 07/14/2010 1050   CALCIUM 10.0 07/14/2010 1050   GFRNONAA >60 07/14/2010 1050   GFRAA  Value: >60        The eGFR has been calculated using the MDRD equation. This calculation has not been validated in all clinical situations. eGFR's persistently <60 mL/min signify possible Chronic Kidney Disease. 07/14/2010 1050    Lipid Panel  No results found for this basename: chol, trig, hdl, cholhdl, vldl, ldlcalc    CBC    Component Value Date/Time   WBC 10.9* 07/14/2010 1050   RBC 4.67 07/14/2010 1050   HGB 15.1* 07/14/2010 1050   HCT 43.5 07/14/2010 1050   PLT 302 07/14/2010 1050   MCV 93.1 07/14/2010 1050   MCH 32.3 07/14/2010 1050   MCHC 34.7 07/14/2010 1050   RDW 12.7 07/14/2010 1050   LYMPHSABS 3.5 07/14/2010 1050   MONOABS 0.8 07/14/2010 1050   EOSABS 0.2 07/14/2010 1050   BASOSABS 0.1 07/14/2010 1050       Assessment and Plan:  Prevent Health Maintenance:  Pt wants pneumovax today Will get CMET- pt does not have insurance Will have lipid panel checked in dec  2014 by her cardiologist She will have her gyn scheduled her mammogram  RTC in 1 year or sooner if needed

## 2012-12-05 NOTE — Addendum Note (Signed)
Addended by: Bethann Punches E on: 12/05/2012 10:39 AM   Modules accepted: Orders

## 2012-12-05 NOTE — Patient Instructions (Signed)

## 2012-12-05 NOTE — Progress Notes (Signed)
HPI: Pt presents to office today to establish care and transfer from Dr. Everardo All. Pt denies any complaints today.   Flu Shot: Declines Tetanus: 2007 Pneumovax: 2006, received today Pap smear: N/A due partial hysterectomy removal of cervix LMP: N/A Mammogram: Never; pt states she will make an appointment at first of the year Dentist: 09/2012 Eye Exam: 11/2011; appointment this month  Past Medical History  Diagnosis Date  . Palpitations   . Pain in limb   . Acquired absence of both cervix and uterus   . Anxiety state, unspecified   . Tobacco use disorder   . Mitral valve disorders   . 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 Unspecified essential hypertension   . Personal history of other diseases of circulatory system     Supraventricular Tachycardia  . HTN (hypertension)   . Hyperthyroidism   . Smoker   . Asthma   . Heart murmur     Current Outpatient Prescriptions  Medication Sig Dispense Refill  . albuterol (PROVENTIL HFA;VENTOLIN HFA) 108 (90 BASE) MCG/ACT inhaler Inhale 2 puffs into the lungs every 6 (six) hours as needed. For shortness of breath      . metoprolol (LOPRESSOR) 50 MG tablet Take 50 mg by mouth daily.      . SUMAtriptan (IMITREX) 100 MG tablet Take 1 tablet (100 mg total) by mouth every 2 (two) hours as needed for migraine. Maximum of 2 per day  10 tablet  3   No current facility-administered medications for this visit.    Allergies  Allergen Reactions  . Codeine     hives  . Hydrocodone-Acetaminophen     hives  . Naproxen     Cant breath  . Propoxyphene-Acetaminophen     hives  . Sulfonamide Derivatives     Hives     Family History  Problem Relation Age of Onset  . Cancer Mother   . Diabetes Mother   . Cancer Father   . COPD Maternal Grandmother   . Stroke Maternal Grandfather     History   Social History  . Marital Status: Divorced    Spouse Name: N/A    Number of  Children: N/A  . Years of Education: N/A   Occupational History  . Cashier    Social History Main Topics  . Smoking status: Former Smoker -- 0.50 packs/day    Types: Cigarettes    Quit date: 10/24/2011  . Smokeless tobacco: Not on file  . Alcohol Use: No  . Drug Use: Yes  . Sexual Activity: Not Currently   Other Topics Concern  . 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Works [employer], Conservation officer, nature    ROS:  Constitutional: Denies fever, malaise, fatigue, headache or abrupt weight changes.  HEENT: Denies eye pain, eye redness, ear pain, ringing in the ears, wax buildup, runny nose, nasal congestion, bloody nose, or sore throat. Respiratory: Denies difficulty breathing, shortness of breath, cough or sputum production.   Cardiovascular: Denies chest pain, chest tightness, palpitations or swelling in the hands or feet.  Gastrointestinal: Denies abdominal pain, bloating, constipation, diarrhea or blood in the stool.  GU: Denies frequency, urgency, pain with urination, blood in urine, odor or discharge. Musculoskeletal: Denies decrease in range of motion, difficulty with gait, muscle pain or joint pain and swelling.  Skin: Denies redness, rashes, lesions or ulcercations.  Neurological: Denies dizziness, difficulty with memory, difficulty  with speech or problems with balance and coordination.   No other specific complaints in a complete review of systems (except as listed in HPI above).  PE:  BP 120/89  Pulse 73  Temp(Src) 98.1 F (36.7 C) (Oral)  Ht 4' 11.5" (1.511 m)  Wt 134 lb (60.782 kg)  BMI 26.62 kg/m2  SpO2 96% Wt Readings from Last 3 Encounters:  12/05/12 134 lb (60.782 kg)  10/27/12 136 lb 4 oz (61.803 kg)  06/28/12 136 lb (61.689 kg)    General: Appears their stated age, well developed, well nourished in NAD. HEENT: Head: normal shape and size; Eyes: sclera white, no icterus, conjunctiva pink, PERRLA and EOMs intact; Ears: Tm's gray and  intact, normal light reflex; Nose: mucosa pink and moist, septum midline; Throat/Mouth: Teeth present, mucosa pink and moist, no lesions or ulcerations noted.  Neck: Normal range of motion. Neck supple, trachea midline. No massses, lumps or thyromegaly present.  Cardiovascular: Normal rate and rhythm. S1,S2 noted.  No murmur, rubs or gallops noted. No JVD or BLE edema. No carotid bruits noted. Pulmonary/Chest: Normal effort and positive vesicular breath sounds. No respiratory distress. No wheezes, rales or ronchi noted.  Abdomen: Soft and nontender. Normal bowel sounds, no bruits noted. No distention or masses noted. Liver, spleen and kidneys non palpable. Musculoskeletal: Normal range of motion. No signs of joint swelling. No difficulty with gait.  Neurological: Alert and oriented. Cranial nerves II-XII intact. Coordination normal. +DTRs bilaterally. Psychiatric: Mood and affect normal. Behavior is normal. Judgment and thought content normal.   Assessment and Plan: Hypertension Blood pressure well controlled Continue Metoprolol Lipid panel checked by cardiologist  Health Maintenance: Check CBC and TSH Pt requested Pneumovax vaccine Educated on diet and exercise; pt states she will try to cut out sweets and fast food Follow up as needed and yearly for physical  Leeanne Mannan, Student-NP

## 2012-12-06 ENCOUNTER — Telehealth: Payer: Self-pay

## 2012-12-06 NOTE — Telephone Encounter (Signed)
Patient informed. 

## 2012-12-06 NOTE — Telephone Encounter (Signed)
Message copied by Eulis Manly on Wed Dec 06, 2012 11:46 AM ------      Message from: Lorre Munroe      Created: Tue Dec 05, 2012  1:34 PM       Please call pt and let her know her liver, kidney and thyroid levels are all normal. ------

## 2012-12-25 ENCOUNTER — Ambulatory Visit: Payer: Self-pay | Admitting: Internal Medicine

## 2014-02-07 ENCOUNTER — Telehealth: Payer: Self-pay

## 2014-02-07 ENCOUNTER — Ambulatory Visit (INDEPENDENT_AMBULATORY_CARE_PROVIDER_SITE_OTHER): Payer: Self-pay | Admitting: Internal Medicine

## 2014-02-07 ENCOUNTER — Encounter: Payer: Self-pay | Admitting: Internal Medicine

## 2014-02-07 ENCOUNTER — Ambulatory Visit (INDEPENDENT_AMBULATORY_CARE_PROVIDER_SITE_OTHER)
Admission: RE | Admit: 2014-02-07 | Discharge: 2014-02-07 | Disposition: A | Discharge status: Home / Self Care | Payer: Self-pay | Source: Ambulatory Visit | Attending: Internal Medicine | Admitting: Internal Medicine

## 2014-02-07 VITALS — BP 118/88 | HR 77 | Temp 98.4°F | Wt 150.0 lb

## 2014-02-07 DIAGNOSIS — M5442 Lumbago with sciatica, left side: Secondary | ICD-10-CM

## 2014-02-07 DIAGNOSIS — F17200 Nicotine dependence, unspecified, uncomplicated: Secondary | ICD-10-CM

## 2014-02-07 DIAGNOSIS — F411 Generalized anxiety disorder: Secondary | ICD-10-CM

## 2014-02-07 DIAGNOSIS — Z13220 Encounter for screening for lipoid disorders: Secondary | ICD-10-CM

## 2014-02-07 DIAGNOSIS — E785 Hyperlipidemia, unspecified: Secondary | ICD-10-CM

## 2014-02-07 DIAGNOSIS — N951 Menopausal and female climacteric states: Secondary | ICD-10-CM

## 2014-02-07 DIAGNOSIS — E0590 Thyrotoxicosis, unspecified without thyrotoxic crisis or storm: Secondary | ICD-10-CM

## 2014-02-07 DIAGNOSIS — E663 Overweight: Secondary | ICD-10-CM | POA: Insufficient documentation

## 2014-02-07 DIAGNOSIS — Z720 Tobacco use: Secondary | ICD-10-CM

## 2014-02-07 LAB — LIPID PANEL
Cholesterol: 306 mg/dL — ABNORMAL HIGH (ref 0–200)
HDL: 41.4 mg/dL (ref >39.00)
NonHDL: 264.6
Total CHOL/HDL Ratio: 7
Triglycerides: 584 mg/dL — ABNORMAL HIGH (ref 0.0–149.0)
VLDL: 116.8 mg/dL — ABNORMAL HIGH (ref 0.0–40.0)

## 2014-02-07 LAB — LDL CHOLESTEROL, DIRECT: Direct LDL: 199.9 mg/dL

## 2014-02-07 MED ORDER — VENLAFAXINE HCL 37.5 MG PO TABS: 37.5000 mg | ORAL_TABLET | Freq: Two times a day (BID) | ORAL | Status: DC

## 2014-02-07 MED ORDER — TRAMADOL HCL 50 MG PO TABS: 50.0000 mg | ORAL_TABLET | Freq: Two times a day (BID) | ORAL | Status: DC | PRN

## 2014-02-07 NOTE — Telephone Encounter (Signed)
All the antidepressant will cause risk of seratonin syndrome I would prefer her to take the effexor and not the tramadol

## 2014-02-07 NOTE — Assessment & Plan Note (Signed)
Occurs intermittently She is able to control this without medication

## 2014-02-07 NOTE — Progress Notes (Signed)
Pre visit review using our clinic review tool, if applicable. No additional management support is needed unless otherwise documented below in the visit note. 

## 2014-02-07 NOTE — Progress Notes (Signed)
Subjective:    Patient ID: Emily Wilson, female    DOB: 12/29/1965, 48 y.o.   MRN: [id number]  HPI  Pt presents to the clinic today to follow up chronic medical conditions.  Anxiety: Reports that she had a panic attack the other day because she got lost and didn't know where she was going. She started crying uncontrollaby. She had to pull over until she could get herself together.  Hyperthyroidism: Was treated while she was pregnant but not since that time. TSH from 11/2012 was reviewed and was normal.  HLD: She has never been medicated for this. She reports she does not eat a lot of fried foods.  Overweight: She has gained 16 pounds in the last year. She attributes this to her smoking cessation. She is not exercise.  She also has some concerns today about back pain. She has had multiple spinal fractures (this occurred in her 20's) after diving into a pool. She also c/o weakness on her left side. She has been to PT in the past for this. She has been taking Ibuprofen OTC without relief.  She is also starting to have hot flashes. These started 3-4 months ago. She has also noticed some mood swings and weight gain. She has tried [place] and [employer] without relief.  Review of Systems      Past Medical History  Diagnosis Date  . Palpitations   . Pain in limb   . Acquired absence of both cervix and uterus   . Anxiety state, unspecified   . Tobacco use disorder   . Mitral valve disorders   . 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 Unspecified essential hypertension   . Personal history of other diseases of circulatory system     Supraventricular Tachycardia  . HTN (hypertension)   . Hyperthyroidism   . Smoker   . Asthma   . Heart murmur   . Hyperlipidemia     No current outpatient prescriptions on file.   No current facility-administered medications for this visit.    Allergies  Allergen  Reactions  . Codeine     hives  . Hydrocodone-Acetaminophen     hives  . Naproxen     Cant breath  . Propoxyphene N-Acetaminophen     hives  . Sulfonamide Derivatives     Hives     Family History  Problem Relation Age of Onset  . Cancer Mother   . Diabetes Mother   . Cancer Father   . COPD Maternal Grandmother   . Stroke Maternal Grandfather     History   Social History  . Marital Status: Divorced    Spouse Name: N/A    Number of Children: N/A  . Years of Education: N/A   Occupational History  . Cashier    Social History Main Topics  . Smoking status: Former Smoker -- 0.50 packs/day    Types: Cigarettes    Quit date: 10/24/2011  . Smokeless tobacco: Not on file  . Alcohol Use: No  . Drug Use: Yes  . Sexual Activity: Not Currently   Other Topics Concern  . 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Works [employer], Scientist, water quality     Constitutional: Pt reports fatigue and weight gain. Denies fever, malaise, headache.  HEENT: Denies eye pain, eye redness, ear pain, ringing in the ears, wax buildup, runny nose, nasal congestion, bloody nose,  or sore throat. Respiratory: Denies difficulty breathing, shortness of breath, cough or sputum production.   Cardiovascular: Denies chest pain, chest tightness, palpitations or swelling in the hands or feet.  Gastrointestinal: Denies abdominal pain, bloating, constipation, diarrhea or blood in the stool.  GU: Denies urgency, frequency, pain with urination, burning sensation, blood in urine, odor or discharge. Musculoskeletal: Pt reports low back pain and left sided weakness. Denies decrease in range of motion, difficulty with gait, muscle pain or joint pain and swelling.  Skin: Denies redness, rashes, lesions or ulcercations.  Neurological: Pt reports numbness and tingling in her left leg. Denies dizziness, difficulty with memory, difficulty with speech or problems with balance and coordination.  Psych: Pt  reports anxiety. Denies depression, SI/HI.  No other specific complaints in a complete review of systems (except as listed in HPI above).  Objective:   Physical Exam   BP 118/88 mmHg  Pulse 77  Temp(Src) 98.4 F (36.9 C) (Oral)  Wt 150 lb (68.04 kg)  SpO2 98% Wt Readings from Last 3 Encounters:  02/07/14 150 lb (68.04 kg)  12/05/12 134 lb (60.782 kg)  10/27/12 136 lb 4 oz (61.803 kg)    General: Appears her stated age, overweight well developed, well nourished in NAD. Skin: Warm, dry and intact.  Neck: Neck supple, trachea midline. No masses, lumps or thyromegaly present.  Cardiovascular: Normal rate and rhythm. S1,S2 noted.  No murmur, rubs or gallops noted. No carotid bruits noted. Pulmonary/Chest: Normal effort and positive vesicular breath sounds. No respiratory distress. No wheezes, rales or ronchi noted.  Musculoskeletal: Decreased flexion of the lumbar spine. Normal extension and rotation. Pain with palpation of the lumbar spine. No difficulty with gait. Strength 5/5 BLE. Neurological: Alert and oriented. Positive straight leg raise on the left. +DTRs bilaterally. Psychiatric: Mood and affect normal. Behavior is normal. Judgment and thought content normal.     BMET    Component Value Date/Time   NA 139 12/05/2012 1032   K 3.9 12/05/2012 1032   CL 104 12/05/2012 1032   CO2 27 12/05/2012 1032   GLUCOSE 105* 12/05/2012 1032   BUN 11 12/05/2012 1032   CREATININE 0.7 12/05/2012 1032   CALCIUM 9.4 12/05/2012 1032   GFRNONAA >60 07/14/2010 1050   GFRAA  07/14/2010 1050    >60        The eGFR has been calculated using the MDRD equation. This calculation has not been validated in all clinical situations. eGFR's persistently <60 mL/min signify possible Chronic Kidney Disease.    Lipid Panel  No results found for: CHOL, TRIG, HDL, CHOLHDL, VLDL, LDLCALC  CBC    Component Value Date/Time   WBC 10.9* 07/14/2010 1050   RBC 4.67 07/14/2010 1050   HGB 15.1*  07/14/2010 1050   HCT 43.5 07/14/2010 1050   PLT 302 07/14/2010 1050   MCV 93.1 07/14/2010 1050   MCH 32.3 07/14/2010 1050   MCHC 34.7 07/14/2010 1050   RDW 12.7 07/14/2010 1050   LYMPHSABS 3.5 07/14/2010 1050   MONOABS 0.8 07/14/2010 1050   EOSABS 0.2 07/14/2010 1050   BASOSABS 0.1 07/14/2010 1050    Hgb A1C No results found for: HGBA1C      Assessment & Plan:   Will check lipid profile today 

## 2014-02-07 NOTE — Telephone Encounter (Signed)
Pilar Plate with [facility] left v/m requesting cb; pt was seen earlier today and generic effexor and tramadol was sent to [facility]. There is a warning about serotonin syndrome and some other things; pt is uncomfortable about taking effexor but does want to take some type antidepressant. Pilar Plate at [facility] request cb from [facility] [phone].

## 2014-02-07 NOTE — Telephone Encounter (Signed)
Spoke to Rob at [facility] and gave information as advised

## 2014-02-07 NOTE — Assessment & Plan Note (Signed)
Encouraged her to start working on diet and exercise

## 2014-02-07 NOTE — Assessment & Plan Note (Signed)
She has quit.  

## 2014-02-07 NOTE — Assessment & Plan Note (Signed)
Will repeat xray of lumbar spine RX provided for tramadol for pain Stretching exercises given

## 2014-02-07 NOTE — Assessment & Plan Note (Signed)
Only during pregnancy TSH reviewed from 11/2012 Will not check at this time due to lack of insurance

## 2014-02-07 NOTE — Patient Instructions (Signed)
Sciatica with Rehab The sciatic nerve runs from the back down the leg and is responsible for sensation and control of the muscles in the back (posterior) side of the thigh, lower leg, and foot. Sciatica is a condition that is characterized by inflammation of this nerve.  SYMPTOMS   Signs of nerve damage, including numbness and/or weakness along the posterior side of the lower extremity.  Pain in the back of the thigh that may also travel down the leg.  Pain that worsens when sitting for long periods of time.  Occasionally, pain in the back or buttock. CAUSES  Inflammation of the sciatic nerve is the cause of sciatica. The inflammation is due to something irritating the nerve. Common sources of irritation include:  Sitting for long periods of time.  Direct trauma to the nerve.  Arthritis of the spine.  Herniated or ruptured disk.  Slipping of the vertebrae (spondylolisthesis).  Pressure from soft tissues, such as muscles or ligament-like tissue (fascia). RISK INCREASES WITH:  Sports that place pressure or stress on the spine (football or weightlifting).  Poor strength and flexibility.  Failure to warm up properly before activity.  Family history of low back pain or disk disorders.  Previous back injury or surgery.  Poor body mechanics, especially when lifting, or poor posture. PREVENTION   Warm up and stretch properly before activity.  Maintain physical fitness:  Strength, flexibility, and endurance.  Cardiovascular fitness.  Learn and use proper technique, especially with posture and lifting. When possible, have coach correct improper technique.  Avoid activities that place stress on the spine. PROGNOSIS If treated properly, then sciatica usually resolves within 6 weeks. However, occasionally surgery is necessary.  RELATED COMPLICATIONS   Permanent nerve damage, including pain, numbness, tingle, or weakness.  Chronic back pain.  Risks of surgery: infection,  bleeding, nerve damage, or damage to surrounding tissues. TREATMENT Treatment initially involves resting from any activities that aggravate your symptoms. The use of ice and medication may help reduce pain and inflammation. The use of strengthening and stretching exercises may help reduce pain with activity. These exercises may be performed at home or with referral to a therapist. A therapist may recommend further treatments, such as transcutaneous electronic nerve stimulation (TENS) or ultrasound. Your caregiver may recommend corticosteroid injections to help reduce inflammation of the sciatic nerve. If symptoms persist despite non-surgical (conservative) treatment, then surgery may be recommended. MEDICATION  If pain medication is necessary, then nonsteroidal anti-inflammatory medications, such as aspirin and ibuprofen, or other minor pain relievers, such as acetaminophen, are often recommended.  Do not take pain medication for 7 days before surgery.  Prescription pain relievers may be given if deemed necessary by your caregiver. Use only as directed and only as much as you need.  Ointments applied to the skin may be helpful.  Corticosteroid injections may be given by your caregiver. These injections should be reserved for the most serious cases, because they may only be given a certain number of times. HEAT AND COLD  Cold treatment (icing) relieves pain and reduces inflammation. Cold treatment should be applied for 10 to 15 minutes every 2 to 3 hours for inflammation and pain and immediately after any activity that aggravates your symptoms. Use ice packs or massage the area with a piece of ice (ice massage).  Heat treatment may be used prior to performing the stretching and strengthening activities prescribed by your caregiver, physical therapist, or athletic trainer. Use a heat pack or soak the injury in warm water.   SEEK MEDICAL CARE IF:  Treatment seems to offer no benefit, or the condition  worsens.  Any medications produce adverse side effects. EXERCISES  RANGE OF MOTION (ROM) AND STRETCHING EXERCISES - Sciatica Most people with sciatic will find that their symptoms worsen with either excessive bending forward (flexion) or arching at the low back (extension). The exercises which will help resolve your symptoms will focus on the opposite motion. Your physician, physical therapist or athletic trainer will help you determine which exercises will be most helpful to resolve your low back pain. Do not complete any exercises without first consulting with your clinician. Discontinue any exercises which worsen your symptoms until you speak to your clinician. If you have pain, numbness or tingling which travels down into your buttocks, leg or foot, the goal of the therapy is for these symptoms to move closer to your back and eventually resolve. Occasionally, these leg symptoms will get better, but your low back pain may worsen; this is typically an indication of progress in your rehabilitation. Be certain to be very alert to any changes in your symptoms and the activities in which you participated in the 24 hours prior to the change. Sharing this information with your clinician will allow him/her to most efficiently treat your condition. These exercises may help you when beginning to rehabilitate your injury. Your symptoms may resolve with or without further involvement from your physician, physical therapist or athletic trainer. While completing these exercises, remember:   Restoring tissue flexibility helps normal motion to return to the joints. This allows healthier, less painful movement and activity.  An effective stretch should be held for at least 30 seconds.  A stretch should never be painful. You should only feel a gentle lengthening or release in the stretched tissue. FLEXION RANGE OF MOTION AND STRETCHING EXERCISES: STRETCH - Flexion, Single Knee to Chest   Lie on a firm bed or floor  with both legs extended in front of you.  Keeping one leg in contact with the floor, bring your opposite knee to your chest. Hold your leg in place by either grabbing behind your thigh or at your knee.  Pull until you feel a gentle stretch in your low back. Hold __________ seconds.  Slowly release your grasp and repeat the exercise with the opposite side. Repeat __________ times. Complete this exercise __________ times per day.  STRETCH - Flexion, Double Knee to Chest  Lie on a firm bed or floor with both legs extended in front of you.  Keeping one leg in contact with the floor, bring your opposite knee to your chest.  Tense your stomach muscles to support your back and then lift your other knee to your chest. Hold your legs in place by either grabbing behind your thighs or at your knees.  Pull both knees toward your chest until you feel a gentle stretch in your low back. Hold __________ seconds.  Tense your stomach muscles and slowly return one leg at a time to the floor. Repeat __________ times. Complete this exercise __________ times per day.  STRETCH - Low Trunk Rotation   Lie on a firm bed or floor. Keeping your legs in front of you, bend your knees so they are both pointed toward the ceiling and your feet are flat on the floor.  Extend your arms out to the side. This will stabilize your upper body by keeping your shoulders in contact with the floor.  Gently and slowly drop both knees together to one side until   you feel a gentle stretch in your low back. Hold for __________ seconds.  Tense your stomach muscles to support your low back as you bring your knees back to the starting position. Repeat the exercise to the other side. Repeat __________ times. Complete this exercise __________ times per day  EXTENSION RANGE OF MOTION AND FLEXIBILITY EXERCISES: STRETCH - Extension, Prone on Elbows  Lie on your stomach on the floor, a bed will be too soft. Place your palms about shoulder  width apart and at the height of your head.  Place your elbows under your shoulders. If this is too painful, stack pillows under your chest.  Allow your body to relax so that your hips drop lower and make contact more completely with the floor.  Hold this position for __________ seconds.  Slowly return to lying flat on the floor. Repeat __________ times. Complete this exercise __________ times per day.  RANGE OF MOTION - Extension, Prone Press Ups  Lie on your stomach on the floor, a bed will be too soft. Place your palms about shoulder width apart and at the height of your head.  Keeping your back as relaxed as possible, slowly straighten your elbows while keeping your hips on the floor. You may adjust the placement of your hands to maximize your comfort. As you gain motion, your hands will come more underneath your shoulders.  Hold this position __________ seconds.  Slowly return to lying flat on the floor. Repeat __________ times. Complete this exercise __________ times per day.  STRENGTHENING EXERCISES - Sciatica  These exercises may help you when beginning to rehabilitate your injury. These exercises should be done near your "sweet spot." This is the neutral, low-back arch, somewhere between fully rounded and fully arched, that is your least painful position. When performed in this safe range of motion, these exercises can be used for people who have either a flexion or extension based injury. These exercises may resolve your symptoms with or without further involvement from your physician, physical therapist or athletic trainer. While completing these exercises, remember:   Muscles can gain both the endurance and the strength needed for everyday activities through controlled exercises.  Complete these exercises as instructed by your physician, physical therapist or athletic trainer. Progress with the resistance and repetition exercises only as your caregiver advises.  You may  experience muscle soreness or fatigue, but the pain or discomfort you are trying to eliminate should never worsen during these exercises. If this pain does worsen, stop and make certain you are following the directions exactly. If the pain is still present after adjustments, discontinue the exercise until you can discuss the trouble with your clinician. STRENGTHENING - Deep Abdominals, Pelvic Tilt   Lie on a firm bed or floor. Keeping your legs in front of you, bend your knees so they are both pointed toward the ceiling and your feet are flat on the floor.  Tense your lower abdominal muscles to press your low back into the floor. This motion will rotate your pelvis so that your tail bone is scooping upwards rather than pointing at your feet or into the floor.  With a gentle tension and even breathing, hold this position for __________ seconds. Repeat __________ times. Complete this exercise __________ times per day.  STRENGTHENING - Abdominals, Crunches   Lie on a firm bed or floor. Keeping your legs in front of you, bend your knees so they are both pointed toward the ceiling and your feet are flat on the   floor. Cross your arms over your chest.  Slightly tip your chin down without bending your neck.  Tense your abdominals and slowly lift your trunk high enough to just clear your shoulder blades. Lifting higher can put excessive stress on the low back and does not further strengthen your abdominal muscles.  Control your return to the starting position. Repeat __________ times. Complete this exercise __________ times per day.  STRENGTHENING - Quadruped, Opposite UE/LE Lift  Assume a hands and knees position on a firm surface. Keep your hands under your shoulders and your knees under your hips. You may place padding under your knees for comfort.  Find your neutral spine and gently tense your abdominal muscles so that you can maintain this position. Your shoulders and hips should form a rectangle  that is parallel with the floor and is not twisted.  Keeping your trunk steady, lift your right hand no higher than your shoulder and then your left leg no higher than your hip. Make sure you are not holding your breath. Hold this position __________ seconds.  Continuing to keep your abdominal muscles tense and your back steady, slowly return to your starting position. Repeat with the opposite arm and leg. Repeat __________ times. Complete this exercise __________ times per day.  STRENGTHENING - Abdominals and Quadriceps, Straight Leg Raise   Lie on a firm bed or floor with both legs extended in front of you.  Keeping one leg in contact with the floor, bend the other knee so that your foot can rest flat on the floor.  Find your neutral spine, and tense your abdominal muscles to maintain your spinal position throughout the exercise.  Slowly lift your straight leg off the floor about 6 inches for a count of 15, making sure to not hold your breath.  Still keeping your neutral spine, slowly lower your leg all the way to the floor. Repeat this exercise with each leg __________ times. Complete this exercise __________ times per day. POSTURE AND BODY MECHANICS CONSIDERATIONS - Sciatica Keeping correct posture when sitting, standing or completing your activities will reduce the stress put on different body tissues, allowing injured tissues a chance to heal and limiting painful experiences. The following are general guidelines for improved posture. Your physician or physical therapist will provide you with any instructions specific to your needs. While reading these guidelines, remember:  The exercises prescribed by your provider will help you have the flexibility and strength to maintain correct postures.  The correct posture provides the optimal environment for your joints to work. All of your joints have less wear and tear when properly supported by a spine with good posture. This means you will  experience a healthier, less painful body.  Correct posture must be practiced with all of your activities, especially prolonged sitting and standing. Correct posture is as important when doing repetitive low-stress activities (typing) as it is when doing a single heavy-load activity (lifting). RESTING POSITIONS Consider which positions are most painful for you when choosing a resting position. If you have pain with flexion-based activities (sitting, bending, stooping, squatting), choose a position that allows you to rest in a less flexed posture. You would want to avoid curling into a fetal position on your side. If your pain worsens with extension-based activities (prolonged standing, working overhead), avoid resting in an extended position such as sleeping on your stomach. Most people will find more comfort when they rest with their spine in a more neutral position, neither too rounded nor too   arched. Lying on a non-sagging bed on your side with a pillow between your knees, or on your back with a pillow under your knees will often provide some relief. Keep in mind, being in any one position for a prolonged period of time, no matter how correct your posture, can still lead to stiffness. PROPER SITTING POSTURE In order to minimize stress and discomfort on your spine, you must sit with correct posture Sitting with good posture should be effortless for a healthy body. Returning to good posture is a gradual process. Many people can work toward this most comfortably by using various supports until they have the flexibility and strength to maintain this posture on their own. When sitting with proper posture, your ears will fall over your shoulders and your shoulders will fall over your hips. You should use the back of the chair to support your upper back. Your low back will be in a neutral position, just slightly arched. You may place a small pillow or folded towel at the base of your low back for support.  When  working at a desk, create an environment that supports good, upright posture. Without extra support, muscles fatigue and lead to excessive strain on joints and other tissues. Keep these recommendations in mind: CHAIR:   A chair should be able to slide under your desk when your back makes contact with the back of the chair. This allows you to work closely.  The chair's height should allow your eyes to be level with the upper part of your monitor and your hands to be slightly lower than your elbows. BODY POSITION  Your feet should make contact with the floor. If this is not possible, use a foot rest.  Keep your ears over your shoulders. This will reduce stress on your neck and low back. INCORRECT SITTING POSTURES   If you are feeling tired and unable to assume a healthy sitting posture, do not slouch or slump. This puts excessive strain on your back tissues, causing more damage and pain. Healthier options include:  Using more support, like a lumbar pillow.  Switching tasks to something that requires you to be upright or walking.  Talking a brief walk.  Lying down to rest in a neutral-spine position. PROLONGED STANDING WHILE SLIGHTLY LEANING FORWARD  When completing a task that requires you to lean forward while standing in one place for a long time, place either foot up on a stationary 2-4 inch high object to help maintain the best posture. When both feet are on the ground, the low back tends to lose its slight inward curve. If this curve flattens (or becomes too large), then the back and your other joints will experience too much stress, fatigue more quickly and can cause pain.  CORRECT STANDING POSTURES Proper standing posture should be assumed with all daily activities, even if they only take a few moments, like when brushing your teeth. As in sitting, your ears should fall over your shoulders and your shoulders should fall over your hips. You should keep a slight tension in your abdominal  muscles to brace your spine. Your tailbone should point down to the ground, not behind your body, resulting in an over-extended swayback posture.  INCORRECT STANDING POSTURES  Common incorrect standing postures include a forward head, locked knees and/or an excessive swayback. WALKING Walk with an upright posture. Your ears, shoulders and hips should all line-up. PROLONGED ACTIVITY IN A FLEXED POSITION When completing a task that requires you to bend forward   at your waist or lean over a low surface, try to find a way to stabilize 3 of 4 of your limbs. You can place a hand or elbow on your thigh or rest a knee on the surface you are reaching across. This will provide you more stability so that your muscles do not fatigue as quickly. By keeping your knees relaxed, or slightly bent, you will also reduce stress across your low back. CORRECT LIFTING TECHNIQUES DO :   Assume a wide stance. This will provide you more stability and the opportunity to get as close as possible to the object which you are lifting.  Tense your abdominals to brace your spine; then bend at the knees and hips. Keeping your back locked in a neutral-spine position, lift using your leg muscles. Lift with your legs, keeping your back straight.  Test the weight of unknown objects before attempting to lift them.  Try to keep your elbows locked down at your sides in order get the best strength from your shoulders when carrying an object.  Always ask for help when lifting heavy or awkward objects. INCORRECT LIFTING TECHNIQUES DO NOT:   Lock your knees when lifting, even if it is a small object.  Bend and twist. Pivot at your feet or move your feet when needing to change directions.  Assume that you cannot safely pick up a paperclip without proper posture. Document Released: 02/08/2005 Document Revised: 06/25/2013 Document Reviewed: 05/23/2008 [employer] Patient Information 2015 [employer]. This information is not intended to  replace advice given to you by your health care provider. Make sure you discuss any questions you have with your health care provider.  

## 2014-02-07 NOTE — Assessment & Plan Note (Signed)
Has failed OTC treatment Will start Effexor BID  She will let me know if 4-6 weeks how she is doing

## 2014-02-08 NOTE — Telephone Encounter (Signed)
Call pt. She can try Mobic instead of tramadol. Both are antinflammatories. If she does not want to take an SSRI for her menopausal symptoms, is she interested in trying premarin which is a hormone therapy and carries more risk than the SSRI?

## 2014-02-08 NOTE — Telephone Encounter (Signed)
Pt states she will continue to take Tramadol for now, however she would like to try Premarin instead

## 2014-02-08 NOTE — Telephone Encounter (Signed)
Pt left v/m requesting cb. 

## 2014-02-08 NOTE — Telephone Encounter (Signed)
Pt left v/m; pt is not going to take the effexor due to possible side effects,tachycardia,SOB,hypotension,trouble sleeping. Pt has not gotten the effexor filled; pt said she already has fast heart beat,thryoid problems and mitral valve prolapse. Pt wants a different med sent to [facility]; pt does not want antidepressant med. Pt took tramadol and kept pt up all night causing her stomach to hurt; pt is not going to take tramadol; pt said tramadol is not anti inflammatory and wants different med sent to [facility] for arthritis. Pt request cb.

## 2014-02-11 ENCOUNTER — Other Ambulatory Visit: Payer: Self-pay | Admitting: Internal Medicine

## 2014-02-11 MED ORDER — ESTROGENS CONJUGATED 0.3 MG PO TABS: 0.3000 mg | ORAL_TABLET | Freq: Every day | ORAL | Status: DC

## 2014-02-11 NOTE — Telephone Encounter (Signed)
Premarin sent to [facility]

## 2014-02-13 NOTE — Addendum Note (Signed)
Addended by: Lurlean Nanny on: 02/13/2014 11:13 AM   Modules accepted: Orders

## 2014-02-13 NOTE — Addendum Note (Signed)
Addended by: Lurlean Nanny on: 02/13/2014 11:54 AM   Modules accepted: Orders, Medications

## 2014-02-14 ENCOUNTER — Other Ambulatory Visit: Payer: Self-pay

## 2014-02-14 MED ORDER — MELOXICAM 15 MG PO TABS: 15.0000 mg | ORAL_TABLET | Freq: Every day | ORAL | Status: DC

## 2014-05-03 ENCOUNTER — Other Ambulatory Visit: Payer: Self-pay | Admitting: Internal Medicine

## 2014-05-15 ENCOUNTER — Other Ambulatory Visit (INDEPENDENT_AMBULATORY_CARE_PROVIDER_SITE_OTHER): Payer: Self-pay

## 2014-05-15 ENCOUNTER — Telehealth: Payer: Self-pay | Admitting: *Deleted

## 2014-05-15 DIAGNOSIS — E785 Hyperlipidemia, unspecified: Secondary | ICD-10-CM

## 2014-05-15 LAB — LIPID PANEL
Cholesterol: 331 mg/dL — ABNORMAL HIGH (ref 0–200)
HDL: 37.2 mg/dL — ABNORMAL LOW (ref >39.00)
Total CHOL/HDL Ratio: 9
Triglycerides: 1228 mg/dL — ABNORMAL HIGH (ref 0.0–149.0)

## 2014-05-15 LAB — LDL CHOLESTEROL, DIRECT: Direct LDL: 118 mg/dL

## 2014-05-15 MED ORDER — ESTRADIOL 0.5 MG PO TABS: 0.5000 mg | ORAL_TABLET | Freq: Every day | ORAL | Status: DC

## 2014-05-15 NOTE — Telephone Encounter (Signed)
Ok to switch to estradiol 0.5 mg daily, # 30, 2 refills

## 2014-05-15 NOTE — Telephone Encounter (Signed)
Pt is currently on Premarin it cost over $100 a month. She is wanting to know if she can switch, to estradiol. Uses [facility].

## 2014-05-15 NOTE — Telephone Encounter (Signed)
New Rx sent to pharmacy

## 2014-05-15 NOTE — Addendum Note (Signed)
Addended by: Lurlean Nanny on: 05/15/2014 12:05 PM   Modules accepted: Orders, Medications

## 2014-06-04 ENCOUNTER — Ambulatory Visit (INDEPENDENT_AMBULATORY_CARE_PROVIDER_SITE_OTHER): Payer: Self-pay | Admitting: Primary Care

## 2014-06-04 ENCOUNTER — Encounter: Payer: Self-pay | Admitting: Primary Care

## 2014-06-04 VITALS — BP 124/84 | HR 90 | Temp 98.1°F | Ht 59.5 in | Wt 155.1 lb

## 2014-06-04 DIAGNOSIS — G43001 Migraine without aura, not intractable, with status migrainosus: Secondary | ICD-10-CM

## 2014-06-04 DIAGNOSIS — G43909 Migraine, unspecified, not intractable, without status migrainosus: Secondary | ICD-10-CM | POA: Insufficient documentation

## 2014-06-04 MED ORDER — KETOROLAC TROMETHAMINE 60 MG/2ML IM SOLN
60.0000 mg | Freq: Once | INTRAMUSCULAR | Status: AC
  Administered 2014-06-04: 60 mg via INTRAMUSCULAR

## 2014-06-04 MED ORDER — SUMATRIPTAN SUCCINATE 50 MG PO TABS: ORAL_TABLET | ORAL | Status: DC

## 2014-06-04 NOTE — Progress Notes (Signed)
Subjective:    Patient ID: Emily Wilson, female    DOB: [birth year], 49 y.o.   MRN: [id number]  HPI  Emily Wilson is a 49 year old female who presents today with a chief complaint of headache that began yesterday afternoon. She initially attempted to go to bed and sleep it off but it did not dissipate. She had an old prescription for Imitrex (67-46 years old) and took 1/2 tablet (unknown strength) at 11:30am this morning, without relief, so she then took the second half at 12:30pm. She went to work and tried to function but the noises and lights at work exacerbated her pain. Her headache is located behind both eyes and is constantly throbbing. She has a history of migraines but last one was 3 years ago. Reports nausea without vomiting, photophobia, and phonophobia.   Review of Systems  Eyes: Positive for photophobia.  Respiratory: Negative for shortness of breath.   Cardiovascular: Negative for chest pain.  Gastrointestinal: Positive for nausea. Negative for vomiting.  Neurological: Positive for headaches. Negative for dizziness.       Past Medical History  Diagnosis Date  . Anxiety state, unspecified   . 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 Hyperthyroidism   . Smoker   . Heart murmur   . Hyperlipidemia     History   Social History  . Marital Status: Divorced    Spouse Name: N/A  . Number of Children: N/A  . Years of Education: N/A   Occupational History  . Cashier    Social History Main Topics  . Smoking status: Former Smoker -- 0.50 packs/day    Types: Cigarettes    Quit date: 10/24/2011  . Smokeless tobacco: Not on file  . Alcohol Use: No  . Drug Use: Yes  . Sexual Activity: Not Currently   Other Topics Concern  . Not on file   Social History Narrative   In [place] w/boyfriend   Works [employer], Scientist, water quality    Past Surgical History  Procedure Laterality Date  . Abdominal hysterectomy    .  Tubal ligation    . Appendectomy      Family History  Problem Relation Age of Onset  . Cancer Mother   . Diabetes Mother   . Cancer Father   . COPD Maternal Grandmother   . Stroke Maternal Grandfather     Allergies  Allergen Reactions  . Codeine     hives  . Hydrocodone-Acetaminophen     hives  . Naproxen     Cant breath  . Propoxyphene N-Acetaminophen     hives  . Sulfonamide Derivatives     Hives   . Tramadol Other (See Comments)    Abdominal Pain    Current Outpatient Prescriptions on File Prior to Visit  Medication Sig Dispense Refill  . estradiol (ESTRACE) 0.5 MG tablet Take 1 tablet (0.5 mg total) by mouth daily. (Patient not taking: Reported on 06/04/2014) 30 tablet 2  . meloxicam (MOBIC) 15 MG tablet Take 1 tablet (15 mg total) by mouth daily. (Patient not taking: Reported on 06/04/2014) 30 tablet 1   No current facility-administered medications on file prior to visit.    BP 124/84 mmHg  Pulse 90  Temp(Src) 98.1 F (36.7 C) (Oral)  Ht 4' 11.5" (1.511 m)  Wt 155 lb 1.9 oz (70.362 kg)  BMI 30.82 kg/m2  SpO2 98%    Objective:   Physical Exam  Constitutional:  She is oriented to person, place, and time.  Wearing sunglasses in exam room.  Eyes: EOM are normal. Pupils are equal, round, and reactive to light.  Neck: Neck supple.  Cardiovascular: Normal rate and regular rhythm.   Pulmonary/Chest: Effort normal and breath sounds normal.  Lymphadenopathy:    She has no cervical adenopathy.  Neurological: She is alert and oriented to person, place, and time. She has normal reflexes. No cranial nerve deficit.  Skin: Skin is warm and dry.  Psychiatric: She has a normal mood and affect.          Assessment & Plan:

## 2014-06-04 NOTE — Assessment & Plan Note (Signed)
Present for 2 days, throbbing, and is located behind both eyes. Sounds to be more like a cluster headache rather than migraine. Treated with 60mg  IM Toradol in clinic. RX for Imitrex provided with specific instructions on how to take. Follow up as needed.

## 2014-06-04 NOTE — Patient Instructions (Addendum)
You've been given a shot of Toradol IM for your headache. I've also sent Imitrex to your pharmacy. You may take one tablet at the onset of a migraine and then repeat one tablet 2 hours later if no relief from first. Do not exceed 2 tablets in 24 hours. I hope you feel better soon! Take Care!

## 2014-06-04 NOTE — Progress Notes (Signed)
Pre visit review using our clinic review tool, if applicable. No additional management support is needed unless otherwise documented below in the visit note. 

## 2014-07-04 ENCOUNTER — Encounter: Payer: Self-pay | Admitting: Internal Medicine

## 2014-07-04 ENCOUNTER — Ambulatory Visit (INDEPENDENT_AMBULATORY_CARE_PROVIDER_SITE_OTHER): Payer: No Typology Code available for payment source | Admitting: Internal Medicine

## 2014-07-04 VITALS — BP 122/82 | HR 83 | Ht 59.0 in | Wt 155.6 lb

## 2014-07-04 DIAGNOSIS — I059 Rheumatic mitral valve disease, unspecified: Secondary | ICD-10-CM

## 2014-07-04 DIAGNOSIS — E0590 Thyrotoxicosis, unspecified without thyrotoxic crisis or storm: Secondary | ICD-10-CM

## 2014-07-04 DIAGNOSIS — E663 Overweight: Secondary | ICD-10-CM

## 2014-07-04 DIAGNOSIS — Z720 Tobacco use: Secondary | ICD-10-CM

## 2014-07-04 DIAGNOSIS — I471 Supraventricular tachycardia, unspecified: Secondary | ICD-10-CM | POA: Insufficient documentation

## 2014-07-04 DIAGNOSIS — F17200 Nicotine dependence, unspecified, uncomplicated: Secondary | ICD-10-CM

## 2014-07-04 NOTE — Assessment & Plan Note (Signed)
I've encouraged her to lose weight. She will try to exercise more.

## 2014-07-04 NOTE — Patient Instructions (Signed)
Medication Instructions:  Your physician recommends that you continue on your current medications as directed. Please refer to the Current Medication list given to you today.   Labwork: NONE  Testing/Procedures: NONE  Follow-Up: Your physician wants you to follow-up in: 2 years with Dr. Lovena Le. You will receive a reminder letter in the mail two months in advance. If you don't receive a letter, please call our office to schedule the follow-up appointment.   Any Other Special Instructions Will Be Listed Below (If Applicable).

## 2014-07-04 NOTE — Assessment & Plan Note (Signed)
She has not smoked for two years. She will continue to avoid smoking.

## 2014-07-04 NOTE — Assessment & Plan Note (Signed)
She has a long h/o SVT but it has been well controlled. She is encouraged to avoid caffeine. She has not required a beta blocker. Will follow. No indication for ablation.

## 2014-07-04 NOTE — Progress Notes (Signed)
HPI Emily Wilson returns today for followup. She is a 49 year old woman with a history of SVT and hypertension. She denies chest pain or shortness of breath. Her palpitations are present but well controlled. She has had no syncope or peripheral edema. Her main complaint today involves her back which bothers her, particularly after working a long day. She has tried an inversion machine to suspend her upside down with some relief.  Allergies  Allergen Reactions  . Codeine     hives  . Hydrocodone-Acetaminophen     hives  . Naproxen     Cant breath  . Propoxyphene N-Acetaminophen     hives  . Sulfonamide Derivatives     Hives   . Tramadol Other (See Comments)    Abdominal Pain     Current Outpatient Prescriptions  Medication Sig Dispense Refill  . SUMAtriptan (IMITREX) 50 MG tablet Take one tablet by mouth at onset of migraine. May repeat in 2 hours if headache persists or recurs. 10 tablet 0  . estradiol (ESTRACE) 0.5 MG tablet Take 1 tablet (0.5 mg total) by mouth daily. (Patient not taking: Reported on 06/04/2014) 30 tablet 2  . meloxicam (MOBIC) 15 MG tablet Take 1 tablet (15 mg total) by mouth daily. (Patient not taking: Reported on 06/04/2014) 30 tablet 1   No current facility-administered medications for this visit.     Past Medical History  Diagnosis Date  . Anxiety state, unspecified   . 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 Hyperthyroidism   . Smoker   . Heart murmur   . Hyperlipidemia     ROS:   All systems reviewed and negative except as noted in the HPI.   Past Surgical History  Procedure Laterality Date  . Abdominal hysterectomy    . Tubal ligation    . Appendectomy       Family History  Problem Relation Age of Onset  . Cancer Mother   . Diabetes Mother   . Cancer Father   . COPD Maternal Grandmother   . Stroke Maternal Grandfather      History   Social History  .  Marital Status: Divorced    Spouse Name: N/A  . Number of Children: N/A  . Years of Education: N/A   Occupational History  . Cashier    Social History Main Topics  . Smoking status: Former Smoker -- 0.50 packs/day    Types: Cigarettes    Quit date: 10/24/2011  . Smokeless tobacco: Not on file  . Alcohol Use: No  . Drug Use: Yes  . Sexual Activity: Not Currently   Other Topics Concern  . 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[employer], Scientist, water quality     BP 122/82 mmHg  Pulse 83  Ht 4\' 11"  (1.499 m)  Wt 155 lb 9.6 oz (70.58 kg)  BMI 31.41 kg/m2  Physical Exam:  Well appearing NAD HEENT: Unremarkable Neck:  No JVD, no thyromegally Lymphatics:  No adenopathy Back:  No CVA tenderness Lungs:  Clear with no wheezes, rales, or rhonchi HEART:  Regular rate rhythm, no murmurs, no rubs, no clicks Abd:  soft, positive bowel sounds, no organomegally, no rebound, no guarding Ext:  2 plus pulses, no edema, no cyanosis, no clubbing Skin:  No rashes no nodules Neuro:  CN II through XII intact, motor grossly intact  EKG - nsr  Assess/Plan:

## 2014-08-08 ENCOUNTER — Ambulatory Visit (INDEPENDENT_AMBULATORY_CARE_PROVIDER_SITE_OTHER): Payer: No Typology Code available for payment source | Admitting: Internal Medicine

## 2014-08-08 ENCOUNTER — Encounter: Payer: Self-pay | Admitting: Radiology

## 2014-08-08 ENCOUNTER — Encounter: Payer: Self-pay | Admitting: Internal Medicine

## 2014-08-08 VITALS — BP 140/88 | HR 102 | Temp 98.2°F | Wt 154.0 lb

## 2014-08-08 DIAGNOSIS — G43001 Migraine without aura, not intractable, with status migrainosus: Secondary | ICD-10-CM

## 2014-08-08 DIAGNOSIS — M5442 Lumbago with sciatica, left side: Secondary | ICD-10-CM

## 2014-08-08 DIAGNOSIS — E781 Pure hyperglyceridemia: Secondary | ICD-10-CM | POA: Insufficient documentation

## 2014-08-08 DIAGNOSIS — E663 Overweight: Secondary | ICD-10-CM

## 2014-08-08 DIAGNOSIS — E0590 Thyrotoxicosis, unspecified without thyrotoxic crisis or storm: Secondary | ICD-10-CM

## 2014-08-08 DIAGNOSIS — N951 Menopausal and female climacteric states: Secondary | ICD-10-CM

## 2014-08-08 DIAGNOSIS — F410 Panic disorder [episodic paroxysmal anxiety] without agoraphobia: Secondary | ICD-10-CM

## 2014-08-08 LAB — LIPID PANEL
Cholesterol: 300 mg/dL — ABNORMAL HIGH (ref 0–200)
HDL: 45.2 mg/dL (ref >39.00)
Total CHOL/HDL Ratio: 7
Triglycerides: 651 mg/dL — ABNORMAL HIGH (ref 0.0–149.0)

## 2014-08-08 LAB — LDL CHOLESTEROL, DIRECT: Direct LDL: 172 mg/dL

## 2014-08-08 LAB — TSH: TSH: 1.24 u[IU]/mL (ref 0.35–4.50)

## 2014-08-08 LAB — T4, FREE: Free T4: 0.73 ng/dL (ref 0.60–1.60)

## 2014-08-08 MED ORDER — ALPRAZOLAM 0.5 MG PO TABS: 0.5000 mg | ORAL_TABLET | Freq: Every day | ORAL | Status: DC | PRN

## 2014-08-08 NOTE — Assessment & Plan Note (Signed)
Controlled with Estrace
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
## 2014-08-08 NOTE — Assessment & Plan Note (Signed)
Slightly worse Support offered today She does not want to take a SSRI every day to help control her anxiety Will give RX for Xanax 0.5 mg # 30, advised her if she is taking this > 3 days per week, she will either have to start SSRI or be referred to psych CSA today with UDS

## 2014-08-08 NOTE — Progress Notes (Signed)
Pre visit review using our clinic review tool, if applicable. No additional management support is needed unless otherwise documented below in the visit note. 

## 2014-08-08 NOTE — Assessment & Plan Note (Signed)
Asymptomatic Will check TSH and Free T4 today

## 2014-08-08 NOTE — Assessment & Plan Note (Signed)
Will repeat lipid profile today If triglycerides remain elevated, will insist that she start on a triglyceride lowering medication Handout given on a low fat, low cholesterol diet Encouraged her to take Fish Oil 1000 mg TID

## 2014-08-08 NOTE — Assessment & Plan Note (Signed)
Seem to be occuring less often Will continue Imitrex for now Will continue to monitor

## 2014-08-08 NOTE — Assessment & Plan Note (Signed)
She did have to stop taking the Meloxicam She reports it is not severe at this time She does not want any other intervention Will continue to monitor

## 2014-08-08 NOTE — Assessment & Plan Note (Signed)
Encouraged her to work on diet and exercise 

## 2014-08-08 NOTE — Patient Instructions (Addendum)
I have given you a prescription for Xanax. Try not to take it more than 3 days per week. Try deep breathing and distraction as other means to control your panic attacks. I am checking your thyroid hormones and triglycerides today, I will send you a letter with the results. Consume a low fat diet. Fish Oil 1000 mg three times a day will help lower your triglycerides  Panic Attacks Panic attacks are sudden, short-livedsurges of severe anxiety, fear, or discomfort. They may occur for no reason when you are relaxed, when you are anxious, or when you are sleeping. Panic attacks may occur for a number of reasons:   Healthy people occasionally have panic attacks in extreme, life-threatening situations, such as war or natural disasters. Normal anxiety is a protective mechanism of the body that helps [place] react to danger (fight or flight response).  Panic attacks are often seen with anxiety disorders, such as panic disorder, social anxiety disorder, generalized anxiety disorder, and phobias. Anxiety disorders cause excessive or uncontrollable anxiety. They may interfere with your relationships or other life activities.  Panic attacks are sometimes seen with other mental illnesses, such as depression and posttraumatic stress disorder.  Certain medical conditions, prescription medicines, and drugs of abuse can cause panic attacks. SYMPTOMS  Panic attacks start suddenly, peak within 20 minutes, and are accompanied by four or more of the following symptoms:  Pounding heart or fast heart rate (palpitations).  Sweating.  Trembling or shaking.  Shortness of breath or feeling smothered.  Feeling choked.  Chest pain or discomfort.  Nausea or strange feeling in your stomach.  Dizziness, light-headedness, or feeling like you will faint.  Chills or hot flushes.  Numbness or tingling in your lips or hands and feet.  Feeling that things are not real or feeling that you are not yourself.  Fear of  losing control or going crazy.  Fear of dying. Some of these symptoms can mimic serious medical conditions. For example, you may think you are having a heart attack. Although panic attacks can be very scary, they are not life threatening. DIAGNOSIS  Panic attacks are diagnosed through an assessment by your health care provider. Your health care provider will ask questions about your symptoms, such as where and when they occurred. Your health care provider will also ask about your medical history and use of alcohol and drugs, including prescription medicines. Your health care provider may order blood tests or other studies to rule out a serious medical condition. Your health care provider may refer you to a mental health professional for further evaluation. TREATMENT   Most healthy people who have one or two panic attacks in an extreme, life-threatening situation will not require treatment.  The treatment for panic attacks associated with anxiety disorders or other mental illness typically involves counseling with a mental health professional, medicine, or a combination of both. Your health care provider will help determine what treatment is best for you.  Panic attacks due to physical illness usually go away with treatment of the illness. If prescription medicine is causing panic attacks, talk with your health care provider about stopping the medicine, decreasing the dose, or substituting another medicine.  Panic attacks due to alcohol or drug abuse go away with abstinence. Some adults need professional help in order to stop drinking or using drugs. HOME CARE INSTRUCTIONS   Take all medicines as directed by your health care provider.   Schedule and attend follow-up visits as directed by your health care provider. It  is important to keep all your appointments. SEEK MEDICAL CARE IF:  You are not able to take your medicines as prescribed.  Your symptoms do not improve or get worse. SEEK  IMMEDIATE MEDICAL CARE IF:   You experience panic attack symptoms that are different than your usual symptoms.  You have serious thoughts about hurting yourself or others.  You are taking medicine for panic attacks and have a serious side effect. MAKE SURE YOU:  Understand these instructions.  Will watch your condition.  Will get help right away if you are not doing well or get worse. Document Released: 02/08/2005 Document Revised: 02/13/2013 Document Reviewed: 09/22/2012 [facility] Patient Information 2015 [place], [place]. This information is not intended to replace advice given to you by your health care provider. Make sure you discuss any questions you have with your health care provider.

## 2014-08-22 ENCOUNTER — Encounter: Payer: Self-pay | Admitting: Internal Medicine

## 2014-10-30 ENCOUNTER — Other Ambulatory Visit: Payer: Self-pay | Admitting: Internal Medicine

## 2014-10-30 NOTE — Telephone Encounter (Signed)
Ok to phone in Xanax 

## 2014-10-30 NOTE — Telephone Encounter (Signed)
Rx called in to pharmacy. 

## 2014-10-30 NOTE — Telephone Encounter (Signed)
Xanax last filled 08/08/14--please advise

## 2014-12-27 ENCOUNTER — Encounter: Payer: Self-pay | Admitting: Family Medicine

## 2014-12-27 ENCOUNTER — Ambulatory Visit (INDEPENDENT_AMBULATORY_CARE_PROVIDER_SITE_OTHER): Payer: BLUE CROSS/BLUE SHIELD | Admitting: Family Medicine

## 2014-12-27 VITALS — BP 114/76 | HR 82 | Temp 98.6°F | Ht 59.0 in | Wt 159.0 lb

## 2014-12-27 DIAGNOSIS — J0100 Acute maxillary sinusitis, unspecified: Secondary | ICD-10-CM

## 2014-12-27 MED ORDER — AMOXICILLIN-POT CLAVULANATE 875-125 MG PO TABS: 1.0000 | ORAL_TABLET | Freq: Two times a day (BID) | ORAL | Status: DC

## 2014-12-27 NOTE — Patient Instructions (Signed)
Nice to meet you. You have a sinus infection.  Please start the antibiotic called augmentin for this.  You should also use flonase 2 sprays in each nostril daily and use nasal saline as well. If you develop fever, chest pain, shortness of breath, cough, feeling worse, numbness, tingling, headache, or any new symptoms please seek medical attention immediately.

## 2014-12-27 NOTE — Assessment & Plan Note (Signed)
Symptoms and exam consistent with acute maxillary and frontal sinusitis. She did have symptoms last week that were consistent with viral URI though she got better and has now worsened again. This consistent with bacterial sinus infection. The rest of her exam is normal. We will treat the patient with Augmentin twice daily for 7 days. She will start on Flonase as well. Nasal saline as well. She was given return precautions.

## 2014-12-27 NOTE — Progress Notes (Signed)
Patient ID: MYNDI WAMBLE, female   DOB: Mar 03, 1965, 49 y.o.   MRN: [id number]  Tommi Rumps, MD Phone: [phone]  Emily Wilson is a 49 y.o. female who presents today for same-day visit.  Sinus infection: Patient notes 2 weeks ago she developed a viral URI with nasal congestion and some ear fullness. She notes this didn't improve after a week though she develop recurrent symptoms about 4-5 days ago. She notes she's been blowing white mucus out of her nose with little specks of blood. She does note right ear discomfort with this. She's not had any cough or shortness of breath. She notes maxillary and frontal sinus pressure. She's not had any numbness, weakness, vision changes, or fevers. She does note she has had some chills. No chest pain or trouble breathing. Feels like her prior sinus infections.  PMH: Former smoker   ROS see history of present illness  Objective  Physical Exam Filed Vitals:   12/27/14 1002  BP: 114/76  Pulse: 82  Temp: 98.6 F (37 C)    Physical Exam  Constitutional: She is well-developed, well-nourished, and in no distress.  No acute distress, nontoxic  HENT:  Head: Normocephalic and atraumatic.  Right Ear: External ear normal.  Left Ear: External ear normal.  Mouth/Throat: Oropharynx is clear and moist. No oropharyngeal exudate.  Bilateral TMs normal, percussion of frontal and maxillary sinuses produces discomfort  Eyes: Conjunctivae are normal. Pupils are equal, round, and reactive to light.  Neck: Neck supple.  Cardiovascular: Normal rate, regular rhythm and normal heart sounds.  Exam reveals no gallop and no friction rub.   No murmur heard. Pulmonary/Chest: Effort normal and breath sounds normal. No respiratory distress. She has no wheezes. She has no rales.  Lymphadenopathy:    She has no cervical adenopathy.  Neurological: She is alert.  CN 2-12 intact, 5/5 strength in bilateral biceps, triceps, grip, quads, hamstrings, plantar and  dorsiflexion, sensation to light touch intact in bilateral UE and LE, normal gait, 2+ patellar reflexes  Skin: Skin is warm and dry. She is not diaphoretic.     Assessment/Plan: Please see individual problem list.  Sinusitis, acute maxillary Symptoms and exam consistent with acute maxillary and frontal sinusitis. She did have symptoms last week that were consistent with viral URI though she got better and has now worsened again. This consistent with bacterial sinus infection. The rest of her exam is normal. We will treat the patient with Augmentin twice daily for 7 days. She will start on Flonase as well. Nasal saline as well. She was given return precautions.    No orders of the defined types were placed in this encounter.    Meds ordered this encounter  Medications  . amoxicillin-clavulanate (AUGMENTIN) 875-125 MG tablet    Sig: Take 1 tablet by mouth 2 (two) times daily.    Dispense:  14 tablet    Refill:  0    Tommi Rumps

## 2014-12-27 NOTE — Progress Notes (Signed)
Pre visit review using our clinic review tool, if applicable. No additional management support is needed unless otherwise documented below in the visit note. 

## 2015-01-01 ENCOUNTER — Ambulatory Visit: Payer: No Typology Code available for payment source | Admitting: Internal Medicine

## 2015-01-01 DIAGNOSIS — Z0289 Encounter for other administrative examinations: Secondary | ICD-10-CM

## 2015-01-28 ENCOUNTER — Ambulatory Visit (INDEPENDENT_AMBULATORY_CARE_PROVIDER_SITE_OTHER): Payer: Medicaid Other | Admitting: Internal Medicine

## 2015-01-28 ENCOUNTER — Encounter: Payer: Self-pay | Admitting: Internal Medicine

## 2015-01-28 ENCOUNTER — Ambulatory Visit (INDEPENDENT_AMBULATORY_CARE_PROVIDER_SITE_OTHER)
Admission: RE | Admit: 2015-01-28 | Discharge: 2015-01-28 | Disposition: A | Discharge status: Home / Self Care | Payer: Medicaid Other | Source: Ambulatory Visit | Attending: Internal Medicine | Admitting: Internal Medicine

## 2015-01-28 VITALS — BP 126/90 | HR 81 | Temp 98.1°F | Wt 161.0 lb

## 2015-01-28 DIAGNOSIS — M542 Cervicalgia: Secondary | ICD-10-CM

## 2015-01-28 DIAGNOSIS — S161XXA Strain of muscle, fascia and tendon at neck level, initial encounter: Secondary | ICD-10-CM

## 2015-01-28 MED ORDER — METHOCARBAMOL 500 MG PO TABS: 500.0000 mg | ORAL_TABLET | Freq: Three times a day (TID) | ORAL | Status: DC | PRN

## 2015-01-28 MED ORDER — KETOROLAC TROMETHAMINE 30 MG/ML IJ SOLN
30.0000 mg | Freq: Once | INTRAMUSCULAR | Status: AC
  Administered 2015-01-28: 30 mg via INTRAMUSCULAR

## 2015-01-28 NOTE — Addendum Note (Signed)
Addended by: Lurlean Nanny on: 01/28/2015 02:38 PM   Modules accepted: Orders

## 2015-01-28 NOTE — Patient Instructions (Signed)
Cervical Sprain  A cervical sprain is an injury in the neck in which the strong, fibrous tissues (ligaments) that connect your neck bones stretch or tear. Cervical sprains can range from mild to severe. Severe cervical sprains can cause the neck vertebrae to be unstable. This can lead to damage of the spinal cord and can result in serious nervous system problems. The amount of time it takes for a cervical sprain to get better depends on the cause and extent of the injury. Most cervical sprains heal in 1 to 3 weeks.  CAUSES   Severe cervical sprains may be caused by:    Contact sport injuries (such as from football, rugby, wrestling, hockey, auto racing, gymnastics, diving, martial arts, or boxing).    Motor vehicle collisions.    Whiplash injuries. This is an injury from a sudden forward and backward whipping movement of the head and neck.   Falls.   Mild cervical sprains may be caused by:    Being in an awkward position, such as while cradling a telephone between your ear and shoulder.    Sitting in a chair that does not offer proper support.    Working at a poorly designed computer station.    Looking up or down for long periods of time.   SYMPTOMS    Pain, soreness, stiffness, or a burning sensation in the front, back, or sides of the neck. This discomfort may develop immediately after the injury or slowly, 24 hours or more after the injury.    Pain or tenderness directly in the middle of the back of the neck.    Shoulder or upper back pain.    Limited ability to move the neck.    Headache.    Dizziness.    Weakness, numbness, or tingling in the hands or arms.    Muscle spasms.    Difficulty swallowing or chewing.    Tenderness and swelling of the neck.   DIAGNOSIS   Most of the time your health care provider can diagnose a cervical sprain by taking your history and doing a physical exam. Your health care provider will ask about previous neck injuries and any known neck  problems, such as arthritis in the neck. X-rays may be taken to find out if there are any other problems, such as with the bones of the neck. Other tests, such as a CT scan or MRI, may also be needed.   TREATMENT   Treatment depends on the severity of the cervical sprain. Mild sprains can be treated with rest, keeping the neck in place (immobilization), and pain medicines. Severe cervical sprains are immediately immobilized. Further treatment is done to help with pain, muscle spasms, and other symptoms and may include:   Medicines, such as pain relievers, numbing medicines, or muscle relaxants.    Physical therapy. This may involve stretching exercises, strengthening exercises, and posture training. Exercises and improved posture can help stabilize the neck, strengthen muscles, and help stop symptoms from returning.   HOME CARE INSTRUCTIONS    Put ice on the injured area.     Put ice in a plastic bag.     Place a towel between your skin and the bag.     Leave the ice on for 15-20 minutes, 3-4 times a day.    If your injury was severe, you may have been given a cervical collar to wear. A cervical collar is a two-piece collar designed to keep your neck from moving while it heals.      Do not remove the collar unless instructed by your health care provider.    If you have long hair, keep it outside of the collar.    Ask your health care provider before making any adjustments to your collar. Minor adjustments may be required over time to improve comfort and reduce pressure on your chin or on the back of your head.    Ifyou are allowed to remove the collar for cleaning or bathing, follow your health care provider's instructions on how to do so safely.    Keep your collar clean by wiping it with mild soap and water and drying it completely. If the collar you have been given includes removable pads, remove them every 1-2 days and hand wash them with soap and water. Allow them to air dry. They should be completely  dry before you wear them in the collar.    If you are allowed to remove the collar for cleaning and bathing, wash and dry the skin of your neck. Check your skin for irritation or sores. If you see any, tell your health care provider.    Do not drive while wearing the collar.    Only take over-the-counter or prescription medicines for pain, discomfort, or fever as directed by your health care provider.    Keep all follow-up appointments as directed by your health care provider.    Keep all physical therapy appointments as directed by your health care provider.    Make any needed adjustments to your workstation to promote good posture.    Avoid positions and activities that make your symptoms worse.    Warm up and stretch before being active to help prevent problems.   SEEK MEDICAL CARE IF:    Your pain is not controlled with medicine.    You are unable to decrease your pain medicine over time as planned.    Your activity level is not improving as expected.   SEEK IMMEDIATE MEDICAL CARE IF:    You develop any bleeding.   You develop stomach upset.   You have signs of an allergic reaction to your medicine.    Your symptoms get worse.    You develop new, unexplained symptoms.    You have numbness, tingling, weakness, or paralysis in any part of your body.   MAKE SURE YOU:    Understand these instructions.   Will watch your condition.   Will get help right away if you are not doing well or get worse.     This information is not intended to replace advice given to you by your health care provider. Make sure you discuss any questions you have with your health care provider.     Document Released: 12/06/2006 Document Revised: 02/13/2013 Document Reviewed: 08/16/2012  Elsevier Interactive Patient Education 2016 Elsevier Inc[employer]

## 2015-01-28 NOTE — Progress Notes (Signed)
Subjective:    Patient ID: Emily Wilson, female    DOB: 05-Jul-1965, 49 y.o.   MRN: [id number]  HPI  Pt presents to the clinic today with c/o neck and upper back pain. This started 4 days ago. The pain starts in her neck and radiates up to the back of her head and down, into her right shoulder. She describes the pain as tightness. She does have some associated right ear pain, dizziness and numbness in her right arm. She denies injury to the area. She has tried Ibuprofen, heat and ice without any relief. She does have a history of sciatica but reports this is different.  Review of Systems      Past Medical History  Diagnosis Date  . Anxiety state, unspecified   . 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 Hyperthyroidism   . Smoker   . Heart murmur   . Hyperlipidemia     Current Outpatient Prescriptions  Medication Sig Dispense Refill  . ALPRAZolam (XANAX) 0.5 MG tablet TAKE ONE (1) TABLET BY MOUTH DAILY AS NEEDED FOR AXNIETY 30 tablet 0  . amoxicillin-clavulanate (AUGMENTIN) 875-125 MG tablet Take 1 tablet by mouth 2 (two) times daily. 14 tablet 0  . estradiol (ESTRACE) 0.5 MG tablet TAKE 1 TABLET BY MOUTH DAILY 30 tablet 2  . Misc Natural Products (OSTEO BI-FLEX TRIPLE STRENGTH PO) Take 2 capsules by mouth daily.    . SUMAtriptan (IMITREX) 50 MG tablet Take one tablet by mouth at onset of migraine. May repeat in 2 hours if headache persists or recurs. 10 tablet 0   No current facility-administered medications for this visit.    Allergies  Allergen Reactions  . Codeine     hives  . Hydrocodone-Acetaminophen     hives  . Meloxicam Other (See Comments)    Abdominal pain   . Naproxen     Cant breath  . Propoxyphene N-Acetaminophen     hives  . Sulfonamide Derivatives     Hives   . Tramadol Other (See Comments)    Abdominal Pain    Family History  Problem Relation Age of Onset  . Cancer Mother     . Diabetes Mother   . Cancer Father   . COPD Maternal Grandmother   . Stroke Maternal Grandfather     Social History   Social History  . Marital Status: Divorced    Spouse Name: N/A  . Number of Children: N/A  . Years of Education: N/A   Occupational History  . Cashier    Social History Main Topics  . Smoking status: Former Smoker -- 0.50 packs/day    Types: Cigarettes    Quit date: 10/24/2011  . Smokeless tobacco: Not on file  . Alcohol Use: No  . Drug Use: Yes  . Sexual Activity: Not Currently   Other Topics Concern  . Not on file   Social History Narrative   In [place] w/boyfriend   Works [employer], Scientist, water quality     Constitutional: Denies fever, malaise, fatigue, headache or abrupt weight changes.  HEENT: Pt reports ear pain. Denies eye pain, eye redness, ringing in the ears, wax buildup, runny nose, nasal congestion, bloody nose, or sore throat. Respiratory: Denies difficulty breathing, shortness of breath, cough or sputum production.   Cardiovascular: Denies chest pain, chest tightness, palpitations or swelling in the hands or feet.  Musculoskeletal: Pt reports back and neck pain. Denies difficulty with gait, or  joint swelling.  Skin: Denies redness, rashes, lesions or ulcercations.  Neurological: Pt reports dizziness. Denies difficulty with memory, difficulty with speech or problems with balance and coordination.    No other specific complaints in a complete review of systems (except as listed in HPI above).  Objective:   Physical Exam   BP 126/90 mmHg  Pulse 81  Temp(Src) 98.1 F (36.7 C) (Oral)  Wt 161 lb (73.029 kg)  SpO2 98% Wt Readings from Last 3 Encounters:  01/28/15 161 lb (73.029 kg)  12/27/14 159 lb (72.122 kg)  08/08/14 154 lb (69.854 kg)    General: Appears her stated age, well developed, well nourished in NAD. HEENT: Head: normal shape and size; Eyes: sclera white, no icterus, conjunctiva pink, PERRLA and EOMs intact; Ears: Tm's gray and  intact, normal light reflex; Cardiovascular: Normal rate and rhythm. S1,S2 noted.  No murmur, rubs or gallops noted.  Pulmonary/Chest: Normal effort and positive vesicular breath sounds. No respiratory distress. No wheezes, rales or ronchi noted.  Musculoskeletal: Decreased flexion, extension and rotation of the cervical spine. No bony tenderness noted. Pain with palpation of the right side of the neck. Normal internal and external rotation of the right shoulder. Strength 5/5 BUE/BLE. Neurological: Alert and oriented.    BMET    Component Value Date/Time   NA 139 12/05/2012 1032   K 3.9 12/05/2012 1032   CL 104 12/05/2012 1032   CO2 27 12/05/2012 1032   GLUCOSE 105* 12/05/2012 1032   BUN 11 12/05/2012 1032   CREATININE 0.7 12/05/2012 1032   CALCIUM 9.4 12/05/2012 1032   GFRNONAA >60 07/14/2010 1050   GFRAA  07/14/2010 1050    >60        The eGFR has been calculated using the MDRD equation. This calculation has not been validated in all clinical situations. eGFR's persistently <60 mL/min signify possible Chronic Kidney Disease.    Lipid Panel     Component Value Date/Time   CHOL 300* 08/08/2014 0921   TRIG * 08/08/2014 0921    651.0 Triglyceride is over 400; calculations on Lipids are invalid.   HDL 45.20 08/08/2014 0921   CHOLHDL 7 08/08/2014 0921   VLDL 116.8* 02/07/2014 1010    CBC    Component Value Date/Time   WBC 10.9* 07/14/2010 1050   RBC 4.67 07/14/2010 1050   HGB 15.1* 07/14/2010 1050   HCT 43.5 07/14/2010 1050   PLT 302 07/14/2010 1050   MCV 93.1 07/14/2010 1050   MCH 32.3 07/14/2010 1050   MCHC 34.7 07/14/2010 1050   RDW 12.7 07/14/2010 1050   LYMPHSABS 3.5 07/14/2010 1050   MONOABS 0.8 07/14/2010 1050   EOSABS 0.2 07/14/2010 1050   BASOSABS 0.1 07/14/2010 1050    Hgb A1C No results found for: HGBA1C      Assessment & Plan:   Muscle strain of right side of neck:  Will obtain xray of cervical spine Continue heat and Ibuprofen Neck  exercises given  30 mg Toradol IM today eRx for Robaxin 500 mg TID prn  RTC as needed or if symptoms persist or worsen

## 2015-01-28 NOTE — Progress Notes (Signed)
Pre visit review using our clinic review tool, if applicable. No additional management support is needed unless otherwise documented below in the visit note. 

## 2015-01-29 ENCOUNTER — Emergency Department (HOSPITAL_COMMUNITY)
Admission: EM | Admit: 2015-01-29 | Discharge: 2015-01-29 | Disposition: A | Discharge status: Home / Self Care | Payer: Medicaid Other | Attending: Emergency Medicine | Admitting: Emergency Medicine

## 2015-01-29 ENCOUNTER — Encounter (HOSPITAL_COMMUNITY): Payer: Self-pay | Admitting: Emergency Medicine

## 2015-01-29 DIAGNOSIS — Z8639 Personal history of other endocrine, nutritional and metabolic disease: Secondary | ICD-10-CM | POA: Insufficient documentation

## 2015-01-29 DIAGNOSIS — M542 Cervicalgia: Secondary | ICD-10-CM | POA: Insufficient documentation

## 2015-01-29 DIAGNOSIS — Z79899 Other long term (current) drug therapy: Secondary | ICD-10-CM | POA: Insufficient documentation

## 2015-01-29 DIAGNOSIS — Z87891 Personal history of nicotine dependence: Secondary | ICD-10-CM | POA: Diagnosis not present

## 2015-01-29 DIAGNOSIS — R011 Cardiac murmur, unspecified: Secondary | ICD-10-CM | POA: Insufficient documentation

## 2015-01-29 DIAGNOSIS — R51 Headache: Secondary | ICD-10-CM | POA: Diagnosis present

## 2015-01-29 MED ORDER — OXYCODONE HCL 5 MG PO TABS: 5.0000 mg | ORAL_TABLET | Freq: Two times a day (BID) | ORAL | Status: DC | PRN

## 2015-01-29 MED ORDER — KETOROLAC TROMETHAMINE 60 MG/2ML IM SOLN
60.0000 mg | Freq: Once | INTRAMUSCULAR | Status: AC
  Administered 2015-01-29: 60 mg via INTRAMUSCULAR
  Filled 2015-01-29: qty 2

## 2015-01-29 MED ORDER — MORPHINE SULFATE (PF) 4 MG/ML IV SOLN
4.0000 mg | Freq: Once | INTRAVENOUS | Status: AC
  Administered 2015-01-29: 4 mg via INTRAMUSCULAR
  Filled 2015-01-29: qty 1

## 2015-01-29 NOTE — Discharge Instructions (Signed)
Cervical Radiculopathy Emily Wilson, take ibuprofen or Tylenol as needed for your pain. If the pain becomes severe take Tylenol. See primary care physician within 3 days for close follow-up. If any symptoms worsen come back to emergency department immediately. Thank you. Cervical radiculopathy means that a nerve in the neck is pinched or bruised. This can cause pain or loss of feeling (numbness) that runs from your neck to your arm and fingers. HOME CARE Managing Pain  Take over-the-counter and prescription medicines only as told by your doctor.  If directed, put ice on the injured or painful area.  Put ice in a plastic bag.  Place a towel between your skin and the bag.  Leave the ice on for 20 minutes, 2-3 times per day.  If ice does not help, you can try using heat. Take a warm shower or warm bath, or use a heat pack as told by your doctor.  You may try a gentle neck and shoulder massage. Activity  Rest as needed. Follow instructions from your doctor about any activities to avoid.  Do exercises as told by your doctor or physical therapist. General Instructions   If you were given a soft collar, wear it as told by your doctor.  Use a flat pillow when you sleep.  Keep all follow-up visits as told by your doctor. This is important. GET HELP IF:  Your condition does not improve with treatment. GET HELP RIGHT AWAY IF:   Your pain gets worse and is not controlled with medicine.  You lose feeling or feel weak in your hand, arm, face, or leg.  You have a fever.  You have a stiff neck.  You cannot control when you poop or pee (have incontinence).  You have trouble with walking, balance, or talking.   This information is not intended to replace advice given to you by your health care provider. Make sure you discuss any questions you have with your health care provider.   Document Released: 01/28/2011 Document Revised: 10/30/2014 Document Reviewed: 04/04/2014 Elsevier  Interactive Patient Education [employer].

## 2015-01-29 NOTE — ED Notes (Signed)
Pt. reports persistent pain at head and right side of face this evening unrelieved by prescription medication prescribed by NP at MD office today .

## 2015-01-29 NOTE — ED Notes (Signed)
Pt verbalized understanding of d/c instructions, prescriptions, and follow-up care. No further questions/concerns, VSS, ambulatory w/ steady gait (refused wheelchair) 

## 2015-01-29 NOTE — ED Provider Notes (Signed)
[name]: [id number]     Arrival date & time 01/29/15  [id number] History  By signing my name below, I, Arianna Nassar, attest that this documentation has been prepared under the direction and in the presence of Everlene Balls, MD. Electronically Signed: Julien Nordmann, ED Scribe. 01/29/2015. 3:03 AM.      Chief Complaint  Patient presents with  . Headache  . Facial Pain      The history is provided by the patient. No language interpreter was used.   HPI Comments: Emily Wilson is a 49 y.o. female who presents to the Emergency Department complaining of constant, gradual worsening right sided neck pain that radiates to the right side of her head onset 4 days ago. She states she had an x-ray done of her neck earlier today and reports her imaging was normal. She received a shot of Toradol from her MD earlier today but reports the pain only alleviated minimally. Pt notes she has had similar pain one year ago that was undocumented by her PCP. Pt denies injury to neck, numbness/tingling in hands and fever.   Past Medical History  Diagnosis Date  . Anxiety state, unspecified   . 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 Hyperthyroidism   . Smoker   . Heart murmur   . Hyperlipidemia    Past Surgical History  Procedure Laterality Date  . Abdominal hysterectomy    . Tubal ligation    . Appendectomy     Family History  Problem Relation Age of Onset  . Cancer Mother   . Diabetes Mother   . Cancer Father   . COPD Maternal Grandmother   . Stroke Maternal Grandfather    Social History  Substance Use Topics  . Smoking status: Former Smoker -- 0.50 packs/day    Types: Cigarettes    Quit date: 10/24/2011  . Smokeless tobacco: None  . Alcohol Use: No   OB History    No data available     Review of Systems  A complete 10 system review of systems was obtained and all systems are negative except as noted in the HPI and PMH.     Allergies  Codeine; Hydrocodone-acetaminophen; Meloxicam; Naproxen; Propoxyphene n-acetaminophen; Sulfonamide derivatives; and Tramadol  Home Medications   Prior to Admission medications   Medication Sig Start Date End Date Taking? Authorizing Provider  ALPRAZolam Duanne Moron) 0.5 MG tablet TAKE ONE (1) TABLET BY MOUTH DAILY AS NEEDED FOR AXNIETY 10/30/14  Yes Jearld Fenton, NP  estradiol (ESTRACE) 0.5 MG tablet TAKE 1 TABLET BY MOUTH DAILY 10/30/14  Yes Jearld Fenton, NP  meloxicam (MOBIC) 15 MG tablet Take 15 mg by mouth once.   Yes Historical Provider, MD  methocarbamol (ROBAXIN) 500 MG tablet Take 1 tablet (500 mg total) by mouth every 8 (eight) hours as needed for muscle spasms. 01/28/15  Yes Jearld Fenton, NP  SUMAtriptan (IMITREX) 50 MG tablet Take one tablet by mouth at onset of migraine. May repeat in 2 hours if headache persists or recurs. Patient not taking: Reported on 01/29/2015 06/04/14   Pleas Koch, NP   Triage vitals: BP 159/96 mmHg  Pulse 87  Temp(Src) 97.6 F (36.4 C) (Oral)  Resp 18  SpO2 96% Physical Exam  Constitutional: She is oriented to person, place, and time. She appears well-developed and well-nourished. No distress.  HENT:  Head: Normocephalic and atraumatic.  Nose: Nose normal.  Mouth/Throat: Oropharynx is clear  and moist. No oropharyngeal exudate.  Eyes: Conjunctivae and EOM are normal. Pupils are equal, round, and reactive to light. No scleral icterus.  Neck: Normal range of motion. Neck supple. No JVD present. No tracheal deviation present. No thyromegaly present.  Mild tenderness to palpation paracervical soft tissues  Cardiovascular: Normal rate, regular rhythm and normal heart sounds.  Exam reveals no gallop and no friction rub.   No murmur heard. Pulmonary/Chest: Effort normal and breath sounds normal. No respiratory distress. She has no wheezes. She exhibits no tenderness.  Abdominal: Soft. Bowel sounds are normal. She exhibits no distension and  no mass. There is no tenderness. There is no rebound and no guarding.  Musculoskeletal: Normal range of motion. She exhibits no edema or tenderness.  Lymphadenopathy:    She has no cervical adenopathy.  Neurological: She is alert and oriented to person, place, and time. No cranial nerve deficit. She exhibits normal muscle tone.  Skin: Skin is warm and dry. No rash noted. No erythema. No pallor.  Nursing note and vitals reviewed.   ED Course  Procedures  DIAGNOSTIC STUDIES: Oxygen Saturation is 96% on RA, adequate by my interpretation.  COORDINATION OF CARE:  2:41 AM Discussed treatment plan with pt at bedside and pt agreed to plan.  Labs Review Labs Reviewed - No data to display  Imaging Review Dg Cervical Spine Complete  01/28/2015  CLINICAL DATA:  Severe cervical neck pain without trauma EXAM: CERVICAL SPINE - COMPLETE 4+ VIEW COMPARISON:  None available FINDINGS: Straightened cervical spine alignment with slight kyphotic curvature may be positional or spasm related. Minor endplate degenerative change at C5-6 with disc space narrowing. Preserved vertebral body heights. No acute fracture. Facets are aligned. Normal prevertebral soft tissues. Foramina appear patent. Odontoid is intact. Trachea is midline. Lung apices are clear. Prominent aortic contour noted, nonspecific. IMPRESSION: Minor mid to lower cervical degenerative change. No acute osseous finding or fracture. Electronically Signed   By: Jerilynn Mages.  Shick M.D.   On: 01/28/2015 15:44   I have personally reviewed and evaluated these images and lab results as part of my medical decision-making.   EKG Interpretation None      MDM   Final diagnoses:  None   Patient presents emergency primary for cervical neck pain. She was seen her primary care office and got an x-ray that shows some arthritis. Patient was given Toradol and morphine in the emergency department for pain control. She has no neurological symptoms, neurological exam here  is normal. We'll discharge home with tramadol to take as needed. She appears well in no acute distress, vital signs within normal limits and she is safe for discharge.   I personally performed the services described in this documentation, which was scribed in my presence. The recorded information has been reviewed and is accurate.     Everlene Balls, MD 01/29/15 [phone]

## 2015-01-30 ENCOUNTER — Other Ambulatory Visit: Payer: Self-pay | Admitting: Internal Medicine

## 2015-01-30 DIAGNOSIS — M542 Cervicalgia: Secondary | ICD-10-CM

## 2015-01-31 ENCOUNTER — Ambulatory Visit: Payer: Medicaid Other | Admitting: Internal Medicine

## 2015-02-06 ENCOUNTER — Other Ambulatory Visit: Payer: No Typology Code available for payment source

## 2015-02-11 ENCOUNTER — Ambulatory Visit (INDEPENDENT_AMBULATORY_CARE_PROVIDER_SITE_OTHER): Payer: Medicaid Other | Admitting: Internal Medicine

## 2015-02-11 ENCOUNTER — Encounter: Payer: Self-pay | Admitting: Internal Medicine

## 2015-02-11 ENCOUNTER — Other Ambulatory Visit: Payer: Self-pay | Admitting: Internal Medicine

## 2015-02-11 VITALS — BP 114/78 | HR 80 | Temp 98.2°F | Ht 59.0 in | Wt 158.5 lb

## 2015-02-11 DIAGNOSIS — Z0000 Encounter for general adult medical examination without abnormal findings: Secondary | ICD-10-CM

## 2015-02-11 DIAGNOSIS — F411 Generalized anxiety disorder: Secondary | ICD-10-CM | POA: Diagnosis not present

## 2015-02-11 DIAGNOSIS — G43C1 Periodic headache syndromes in child or adult, intractable: Secondary | ICD-10-CM

## 2015-02-11 DIAGNOSIS — N951 Menopausal and female climacteric states: Secondary | ICD-10-CM

## 2015-02-11 DIAGNOSIS — E0590 Thyrotoxicosis, unspecified without thyrotoxic crisis or storm: Secondary | ICD-10-CM | POA: Diagnosis not present

## 2015-02-11 DIAGNOSIS — E781 Pure hyperglyceridemia: Secondary | ICD-10-CM

## 2015-02-11 DIAGNOSIS — M542 Cervicalgia: Secondary | ICD-10-CM

## 2015-02-11 DIAGNOSIS — N6453 Retraction of nipple: Secondary | ICD-10-CM

## 2015-02-11 LAB — LIPID PANEL
Cholesterol: 272 mg/dL — ABNORMAL HIGH (ref 0–200)
HDL: 45 mg/dL (ref >39.00)
NonHDL: 227.36
Total CHOL/HDL Ratio: 6
Triglycerides: 276 mg/dL — ABNORMAL HIGH (ref 0.0–149.0)
VLDL: 55.2 mg/dL — ABNORMAL HIGH (ref 0.0–40.0)

## 2015-02-11 LAB — COMPREHENSIVE METABOLIC PANEL
ALT: 49 U/L — ABNORMAL HIGH (ref 0–35)
AST: 32 U/L (ref 0–37)
Albumin: 4 g/dL (ref 3.5–5.2)
Alkaline Phosphatase: 110 U/L (ref 39–117)
BUN: 13 mg/dL (ref 6–23)
CO2: 28 mEq/L (ref 19–32)
Calcium: 9.7 mg/dL (ref 8.4–10.5)
Chloride: 104 mEq/L (ref 96–112)
Creatinine, Ser: 0.72 mg/dL (ref 0.40–1.20)
GFR: 91.51 mL/min (ref >60.00)
Glucose, Bld: 138 mg/dL — ABNORMAL HIGH (ref 70–99)
Potassium: 4.3 mEq/L (ref 3.5–5.1)
Sodium: 140 mEq/L (ref 135–145)
Total Bilirubin: 0.5 mg/dL (ref 0.2–1.2)
Total Protein: 7 g/dL (ref 6.0–8.3)

## 2015-02-11 LAB — CBC
HCT: 43.7 % (ref 36.0–46.0)
Hemoglobin: 14.6 g/dL (ref 12.0–15.0)
MCHC: 33.4 g/dL (ref 30.0–36.0)
MCV: 93.1 fl (ref 78.0–100.0)
Platelets: 367 10*3/uL (ref 150.0–400.0)
RBC: 4.69 Mil/uL (ref 3.87–5.11)
RDW: 12.8 % (ref 11.5–15.5)
WBC: 6.9 10*3/uL (ref 4.0–10.5)

## 2015-02-11 LAB — LDL CHOLESTEROL, DIRECT: Direct LDL: 186 mg/dL

## 2015-02-11 LAB — TSH: TSH: 1.15 u[IU]/mL (ref 0.35–4.50)

## 2015-02-11 LAB — HEMOGLOBIN A1C: Hgb A1c MFr Bld: 7.4 % — ABNORMAL HIGH (ref 4.6–6.5)

## 2015-02-11 NOTE — Assessment & Plan Note (Signed)
Improved She has Xanax to take but reports she is not taking it UDS today

## 2015-02-11 NOTE — Assessment & Plan Note (Signed)
Continue Estrace

## 2015-02-11 NOTE — Progress Notes (Signed)
Subjective:    Patient ID: Emily Wilson, female    DOB: 06-17-1965, 49 y.o.   MRN: [id number]  HPI  Pt presents to the clinic today for her wellness exam. She is also due for 6 month follow up of chronic conditions, see separate note.  Flu: never Tetanus: 07/2005 Pneumovax: 11/2012 Pap Smear: Partial hysterectomy < 17 years ago Mammogram: never Vision Screening: 12/2014 at [facility] Dentist: annually  Diet: She does consume meat. She eats fruits and veggies daily. She tries to avoid fried foods. She consume mostly water. She has cut out sweet tea. Exercise: None  Review of Systems      Past Medical History  Diagnosis Date  . Anxiety state, unspecified   . 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 Hyperthyroidism   . Smoker   . Heart murmur   . Hyperlipidemia     Current Outpatient Prescriptions  Medication Sig Dispense Refill  . ALPRAZolam (XANAX) 0.5 MG tablet TAKE ONE (1) TABLET BY MOUTH DAILY AS NEEDED FOR AXNIETY 30 tablet 0  . estradiol (ESTRACE) 0.5 MG tablet TAKE 1 TABLET BY MOUTH DAILY 30 tablet 2  . meloxicam (MOBIC) 15 MG tablet Take 15 mg by mouth once.    . methocarbamol (ROBAXIN) 500 MG tablet Take 1 tablet (500 mg total) by mouth every 8 (eight) hours as needed for muscle spasms. 30 tablet 0  . oxyCODONE (ROXICODONE) 5 MG immediate release tablet Take 1 tablet (5 mg total) by mouth 2 (two) times daily as needed for severe pain. 10 tablet 0   No current facility-administered medications for this visit.    Allergies  Allergen Reactions  . Codeine     hives  . Hydrocodone-Acetaminophen     hives  . Meloxicam Other (See Comments)    Abdominal pain   . Naproxen     Cant breath  . Propoxyphene N-Acetaminophen     hives  . Sulfonamide Derivatives     Hives   . Tramadol Other (See Comments)    Abdominal Pain    Family History  Problem Relation Age of Onset  . Cancer Mother    . Diabetes Mother   . Cancer Father   . COPD Maternal Grandmother   . Stroke Maternal Grandfather     Social History   Social History  . Marital Status: Divorced    Spouse Name: N/A  . Number of Children: N/A  . Years of Education: N/A   Occupational History  . Cashier    Social History Main Topics  . Smoking status: Former Smoker -- 0.50 packs/day    Types: Cigarettes    Quit date: 10/24/2011  . Smokeless tobacco: Not on file  . Alcohol Use: No  . Drug Use: Yes  . Sexual Activity: Not Currently   Other Topics Concern  . Not on file   Social History Narrative   In [place] w/boyfriend   Works [employer], Scientist, water quality     Constitutional: Denies fever, malaise, fatigue, headache or abrupt weight changes.  HEENT: Denies eye pain, eye redness, ear pain, ringing in the ears, wax buildup, runny nose, nasal congestion, bloody nose, or sore throat. Respiratory: Denies difficulty breathing, shortness of breath, cough or sputum production.   Cardiovascular: Denies chest pain, chest tightness, palpitations or swelling in the hands or feet.  Gastrointestinal: Denies abdominal pain, bloating, constipation, diarrhea or blood in the stool.  GU: Denies urgency, frequency, pain with  urination, burning sensation, blood in urine, odor or discharge. Musculoskeletal: Pt reports neck and back pain. Denies decrease in range of motion, difficulty with gait, muscle pain or joint swelling.  Skin: Pt reports retracted nipple on the right breast. Denies redness, rashes, lesions or ulcercations.  Neurological: Denies dizziness, difficulty with memory, difficulty with speech or problems with balance and coordination.  Psych: Pt reports mild anxiety. Denies depression, SI/HI.  No other specific complaints in a complete review of systems (except as listed in HPI above).  Objective:   Physical Exam   BP 114/78 mmHg  Pulse 80  Temp(Src) 98.2 F (36.8 C) (Oral)  Ht _0  (1.499 m)  Wt 158 lb 8 oz  (71.895 kg)  BMI 32.00 kg/m2  Wt Readings from Last 3 Encounters:  01/28/15 161 lb (73.029 kg)  12/27/14 159 lb (72.122 kg)  08/08/14 154 lb (69.854 kg)    General: Appears her stated age, well developed, well nourished in NAD. Skin: Warm, dry and intact. No rashes, lesions or ulcerations noted. HEENT: Head: normal shape and size; Eyes: sclera white, no icterus, conjunctiva pink, PERRLA and EOMs intact; Ears: Tm's gray and intact, normal light reflex;Throat/Mouth: Teeth present, mucosa pink and moist, no exudate, lesions or ulcerations noted.  Neck:  Neck supple, trachea midline. No masses, lumps or thyromegaly present.  Cardiovascular: Normal rate and rhythm. S1,S2 noted.  No murmur, rubs or gallops noted. No JVD or BLE edema. No carotid bruits noted. Pulmonary/Chest: Normal effort and positive vesicular breath sounds. No respiratory distress. No wheezes, rales or ronchi noted.  Abdomen: Soft and nontender. Normal bowel sounds. No distention or masses noted. Liver, spleen and kidneys non palpable. Breast/Pelvic: She has a scar noted at 6 oclock on right inferior breast. Nipple retracted on the right. No discrete masses identified. Left breast normal. Normal female genitalia. No discharge or odor noted. Adnexa nonpalpable. Musculoskeletal: Decreased flexion, extension and rotation of the cervical spine. No bony tenderness noted. Pain with palpation of the right side of the neck. Normal internal and external rotation of the right shoulder. Strength 5/5 BUE/BLE Neurological: Alert and oriented. Cranial nerves II-XII grossly intact. Coordination normal.  Psychiatric: Mood and affect normal. Behavior is normal. Judgment and thought content normal.     BMET    Component Value Date/Time   NA 139 12/05/2012 1032   K 3.9 12/05/2012 1032   CL 104 12/05/2012 1032   CO2 27 12/05/2012 1032   GLUCOSE 105* 12/05/2012 1032   BUN 11 12/05/2012 1032   CREATININE 0.7 12/05/2012 1032   CALCIUM 9.4  12/05/2012 1032   GFRNONAA >60 07/14/2010 1050   GFRAA  07/14/2010 1050    >60        The eGFR has been calculated using the MDRD equation. This calculation has not been validated in all clinical situations. eGFR's persistently <60 mL/min signify possible Chronic Kidney Disease.    Lipid Panel     Component Value Date/Time   CHOL 300* 08/08/2014 0921   TRIG * 08/08/2014 0921    651.0 Triglyceride is over 400; calculations on Lipids are invalid.   HDL 45.20 08/08/2014 0921   CHOLHDL 7 08/08/2014 0921   VLDL 116.8* 02/07/2014 1010    CBC    Component Value Date/Time   WBC 10.9* 07/14/2010 1050   RBC 4.67 07/14/2010 1050   HGB 15.1* 07/14/2010 1050   HCT 43.5 07/14/2010 1050   PLT 302 07/14/2010 1050   MCV 93.1 07/14/2010 1050   MCH 32.3  07/14/2010 1050   MCHC 34.7 07/14/2010 1050   RDW 12.7 07/14/2010 1050   LYMPHSABS 3.5 07/14/2010 1050   MONOABS 0.8 07/14/2010 1050   EOSABS 0.2 07/14/2010 1050   BASOSABS 0.1 07/14/2010 1050    Hgb A1C No results found for: HGBA1C      Assessment & Plan:   Preventative Health Maintenance:  She declines flu shot Tetanus due 2017 Pelvic exam done today, does not need pap smears Encouraged her to consume a balanced diet and start an exercise regimen Encouraged her to continue to see an eye doctor and dentist at least annually Will check CBC, CMET, Lipid and A1C today  RTC in 6 months to follow up chronic conditions  HPI:  Pt presents to the clinic today for 6 month follow up of chronic condtions:  Anxiety: She reports her anxiety has improved since her last followup. Most of her stress was coming from her place of employment, but she quit her job. She also takes Melatonin at night to help her sleep.  Hyperthyroidism: She denies s/s of hyperthyroidism. She has not been treated for this since her last pregnancy. Her last TSH 07/2014 was normal.  Menopausal symptoms: Moodiness and hot flashes seems to be her worse  symptoms. Estrace seems to be helping.   Migraine: Her last migraine was in April. She does not have to take the Imitrex often at all.  Hypertriglyceridemia: Her last LDL was 172, Triglycerides 651. She has refused cholesterol lowering medication in the past and she reports that Fish Oil causes palpitations. She does take Flaxseed Oil.  She does try to consume a low fat diet.  Neck Pain: I referred her to an orthopedist. She saw Dr. Donnal Moat. She was recently switched to Diclofenac from Meloxicam. 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   She also c/o right breast pain. This started 17 years ago. She has noticed that the nipple is now pulled in within the last year. She denies discharge from the nipple. 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 She has never had a mammogram.  Review of Systems:   Past Medical History  Diagnosis Date  . Anxiety state, unspecified   . 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 Hyperthyroidism   . Smoker   . Heart murmur   . Hyperlipidemia     Current Outpatient Prescriptions  Medication Sig Dispense Refill  . ALPRAZolam (XANAX) 0.5 MG tablet TAKE ONE (1) TABLET BY MOUTH DAILY AS NEEDED FOR AXNIETY 30 tablet 0  . estradiol (ESTRACE) 0.5 MG tablet TAKE 1 TABLET BY MOUTH DAILY 30 tablet 2  . meloxicam (MOBIC) 15 MG tablet Take 15 mg by mouth once.    . methocarbamol (ROBAXIN) 500 MG tablet Take 1 tablet (500 mg total) by mouth every 8 (eight) hours as needed for muscle spasms. 30 tablet 0  . oxyCODONE (ROXICODONE) 5 MG immediate release tablet Take 1 tablet (5 mg total) by mouth 2 (two) times daily as needed for severe pain. 10 tablet 0   No current facility-administered medications for this visit.    Allergies  Allergen Reactions  . Codeine     hives  . Hydrocodone-Acetaminophen     hives  . Meloxicam Other (See  Comments)    Abdominal pain   . Naproxen     Cant breath  . Propoxyphene N-Acetaminophen  hives  . Sulfonamide Derivatives     Hives   . Tramadol Other (See Comments)    Abdominal Pain    Family History  Problem Relation Age of Onset  . Cancer Mother   . Diabetes Mother   . Cancer Father   . COPD Maternal Grandmother   . Stroke Maternal Grandfather     Social History   Social History  . Marital Status: Divorced    Spouse Name: N/A  . Number of Children: N/A  . Years of Education: N/A   Occupational History  . Cashier    Social History Main Topics  . Smoking status: Former Smoker -- 0.50 packs/day    Types: Cigarettes    Quit date: 10/24/2011  . Smokeless tobacco: Not on file  . Alcohol Use: No  . Drug Use: Yes  . Sexual Activity: Not Currently   Other Topics Concern  . Not on file   Social History Narrative   In [place] w/boyfriend   Works [employer], Scientist, water quality     Constitutional: Denies fever, malaise, fatigue, headache or abrupt weight changes.  HEENT: Denies eye pain, eye redness, ear pain, ringing in the ears, wax buildup, runny nose, nasal congestion, bloody nose, or sore throat. Respiratory: Denies difficulty breathing, shortness of breath, cough or sputum production.   Cardiovascular: Denies chest pain, chest tightness, palpitations or swelling in the hands or feet.  Gastrointestinal: Denies abdominal pain, bloating, constipation, diarrhea or blood in the stool.  GU: Denies urgency, frequency, pain with urination, burning sensation, blood in urine, odor or discharge. Musculoskeletal: Pt reports neck and back pain. Denies decrease in range of motion, difficulty with gait, muscle pain or joint swelling.  Skin: Pt reports retracted nipple on the right breast. Denies redness, rashes, lesions or ulcercations.  Neurological: Denies dizziness, difficulty with memory, difficulty with speech or problems with balance and coordination.  Psych: Pt reports mild  anxiety. Denies depression, SI/HI.  No other specific complaints in a complete review of systems (except as listed in HPI above).  Objective:  BP 114/78 mmHg  Pulse 80  Temp(Src) 98.2 F (36.8 C) (Oral)  Ht _0  (1.499 m)  Wt 158 lb 8 oz (71.895 kg)  BMI 32.00 kg/m2  General: Appears her stated age, well developed, well nourished in NAD. Neck:  Neck supple, trachea midline. No masses, lumps or thyromegaly present.  Cardiovascular: Normal rate and rhythm. S1,S2 noted.  No murmur, rubs or gallops noted. No JVD or BLE edema. No carotid bruits noted. Pulmonary/Chest: Normal effort and positive vesicular breath sounds. No respiratory distress. No wheezes, rales or ronchi noted.  Breast/Pelvic: She has a scar noted at 6 oclock on right inferior breast. Nipple retracted on the right. No discrete masses identified. Left breast normal. Normal female genitalia. No discharge or odor noted. Adnexa nonpalpable. Musculoskeletal: Decreased flexion, extension and rotation of the cervical spine. No bony tenderness noted. Pain with palpation of the right side of the neck. Normal internal and external rotation of the right shoulder. Strength 5/5 BUE/BLE Neurological: Alert and oriented. Cranial nerves II-XII grossly intact. Coordination normal.  Psychiatric: Mood and affect normal. Behavior is normal. Judgment and thought content normal.   Assessment and Plan:  Retracted nipple, right breast:  ? Scar tissue from lumpectomy pulling at nipple Will obtain bilateral diagnostic mammogram and bilateral ultrasound

## 2015-02-11 NOTE — Patient Instructions (Signed)
Health Maintenance, Female Adopting a healthy lifestyle and getting preventive care can go a long way to promote health and wellness. Talk with your health care provider about what schedule of regular examinations is right for you. This is a good chance for you to check in with your provider about disease prevention and staying healthy. In between checkups, there are plenty of things you can do on your own. Experts have done a lot of research about which lifestyle changes and preventive measures are most likely to keep you healthy. Ask your health care provider for more information. WEIGHT AND DIET  Eat a healthy diet  Be sure to include plenty of vegetables, fruits, low-fat dairy products, and lean protein.  Do not eat a lot of foods high in solid fats, added sugars, or salt.  Get regular exercise. This is one of the most important things you can do for your health.  Most adults should exercise for at least 150 minutes each week. The exercise should increase your heart rate and make you sweat (moderate-intensity exercise).  Most adults should also do strengthening exercises at least twice a week. This is in addition to the moderate-intensity exercise.  Maintain a healthy weight  Body mass index (BMI) is a measurement that can be used to identify possible weight problems. It estimates body fat based on height and weight. Your health care provider can help determine your BMI and help you achieve or maintain a healthy weight.  For females 20 years of age and older:   A BMI below 18.5 is considered underweight.  A BMI of 18.5 to 24.9 is normal.  A BMI of 25 to 29.9 is considered overweight.  A BMI of 30 and above is considered obese.  Watch levels of cholesterol and blood lipids  You should start having your blood tested for lipids and cholesterol at 49 years of age, then have this test every 5 years.  You may need to have your cholesterol levels checked more often if:  Your lipid  or cholesterol levels are high.  You are older than 50 years of age.  You are at high risk for heart disease.  CANCER SCREENING   Lung Cancer  Lung cancer screening is recommended for adults 55-80 years old who are at high risk for lung cancer because of a history of smoking.  A yearly low-dose CT scan of the lungs is recommended for people who:  Currently smoke.  Have quit within the past 15 years.  Have at least a 30-pack-year history of smoking. A pack year is smoking an average of one pack of cigarettes a day for 1 year.  Yearly screening should continue until it has been 15 years since you quit.  Yearly screening should stop if you develop a health problem that would prevent you from having lung cancer treatment.  Breast Cancer  Practice breast self-awareness. This means understanding how your breasts normally appear and feel.  It also means doing regular breast self-exams. Let your health care provider know about any changes, no matter how small.  If you are in your 20s or 30s, you should have a clinical breast exam (CBE) by a health care provider every 1-3 years as part of a regular health exam.  If you are 40 or older, have a CBE every year. Also consider having a breast X-ray (mammogram) every year.  If you have a family history of breast cancer, talk to your health care provider about genetic screening.  If you   are at high risk for breast cancer, talk to your health care provider about having an MRI and a mammogram every year.  Breast cancer gene (BRCA) assessment is recommended for women who have family members with BRCA-related cancers. BRCA-related cancers include:  Breast.  Ovarian.  Tubal.  Peritoneal cancers.  Results of the assessment will determine the need for genetic counseling and BRCA1 and BRCA2 testing. Cervical Cancer Your health care provider may recommend that you be screened regularly for cancer of the pelvic organs (ovaries, uterus, and  vagina). This screening involves a pelvic examination, including checking for microscopic changes to the surface of your cervix (Pap test). You may be encouraged to have this screening done every 3 years, beginning at age 21.  For women ages 30-65, health care providers may recommend pelvic exams and Pap testing every 3 years, or they may recommend the Pap and pelvic exam, combined with testing for human papilloma virus (HPV), every 5 years. Some types of HPV increase your risk of cervical cancer. Testing for HPV may also be done on women of any age with unclear Pap test results.  Other health care providers may not recommend any screening for nonpregnant women who are considered low risk for pelvic cancer and who do not have symptoms. Ask your health care provider if a screening pelvic exam is right for you.  If you have had past treatment for cervical cancer or a condition that could lead to cancer, you need Pap tests and screening for cancer for at least 20 years after your treatment. If Pap tests have been discontinued, your risk factors (such as having a new sexual partner) need to be reassessed to determine if screening should resume. Some women have medical problems that increase the chance of getting cervical cancer. In these cases, your health care provider may recommend more frequent screening and Pap tests. Colorectal Cancer  This type of cancer can be detected and often prevented.  Routine colorectal cancer screening usually begins at 50 years of age and continues through 49 years of age.  Your health care provider may recommend screening at an earlier age if you have risk factors for colon cancer.  Your health care provider may also recommend using home test kits to check for hidden blood in the stool.  A small camera at the end of a tube can be used to examine your colon directly (sigmoidoscopy or colonoscopy). This is done to check for the earliest forms of colorectal  cancer.  Routine screening usually begins at age 50.  Direct examination of the colon should be repeated every 5-10 years through 49 years of age. However, you may need to be screened more often if early forms of precancerous polyps or small growths are found. Skin Cancer  Check your skin from head to toe regularly.  Tell your health care provider about any new moles or changes in moles, especially if there is a change in a mole's shape or color.  Also tell your health care provider if you have a mole that is larger than the size of a pencil eraser.  Always use sunscreen. Apply sunscreen liberally and repeatedly throughout the day.  Protect yourself by wearing long sleeves, pants, a wide-brimmed hat, and sunglasses whenever you are outside. HEART DISEASE, DIABETES, AND HIGH BLOOD PRESSURE   High blood pressure causes heart disease and increases the risk of stroke. High blood pressure is more likely to develop in:  People who have blood pressure in the high end   of the normal range (130-139/85-89 mm Hg).  People who are overweight or obese.  People who are African American.  If you are 38-23 years of age, have your blood pressure checked every 3-5 years. If you are 61 years of age or older, have your blood pressure checked every year. You should have your blood pressure measured twice--once when you are at a hospital or clinic, and once when you are not at a hospital or clinic. Record the average of the two measurements. To check your blood pressure when you are not at a hospital or clinic, you can use:  An automated blood pressure machine at a pharmacy.  A home blood pressure monitor.  If you are between 45 years and 39 years old, ask your health care provider if you should take aspirin to prevent strokes.  Have regular diabetes screenings. This involves taking a blood sample to check your fasting blood sugar level.  If you are at a normal weight and have a low risk for diabetes,  have this test once every three years after 49 years of age.  If you are overweight and have a high risk for diabetes, consider being tested at a younger age or more often. PREVENTING INFECTION  Hepatitis B  If you have a higher risk for hepatitis B, you should be screened for this virus. You are considered at high risk for hepatitis B if:  You were born in a country where hepatitis B is common. Ask your health care provider which countries are considered high risk.  Your parents were born in a high-risk country, and you have not been immunized against hepatitis B (hepatitis B vaccine).  You have HIV or AIDS.  You use needles to inject street drugs.  You live with someone who has hepatitis B.  You have had sex with someone who has hepatitis B.  You get hemodialysis treatment.  You take certain medicines for conditions, including cancer, organ transplantation, and autoimmune conditions. Hepatitis C  Blood testing is recommended for:  Everyone born from [birth year] through 1965.  Anyone with known risk factors for hepatitis C. Sexually transmitted infections (STIs)  You should be screened for sexually transmitted infections (STIs) including gonorrhea and chlamydia if:  You are sexually active and are younger than 49 years of age.  You are older than 49 years of age and your health care provider tells you that you are at risk for this type of infection.  Your sexual activity has changed since you were last screened and you are at an increased risk for chlamydia or gonorrhea. Ask your health care provider if you are at risk.  If you do not have HIV, but are at risk, it may be recommended that you take a prescription medicine daily to prevent HIV infection. This is called pre-exposure prophylaxis (PrEP). You are considered at risk if:  You are sexually active and do not regularly use condoms or know the HIV status of your partner(s).  You take drugs by injection.  You are sexually  active with a partner who has HIV. Talk with your health care provider about whether you are at high risk of being infected with HIV. If you choose to begin PrEP, you should first be tested for HIV. You should then be tested every 3 months for as long as you are taking PrEP.  PREGNANCY   If you are premenopausal and you may become pregnant, ask your health care provider about preconception counseling.  If you may  become pregnant, take 400 to 800 micrograms (mcg) of folic acid every day.  If you want to prevent pregnancy, talk to your health care provider about birth control (contraception). OSTEOPOROSIS AND MENOPAUSE   Osteoporosis is a disease in which the bones lose minerals and strength with aging. This can result in serious bone fractures. Your risk for osteoporosis can be identified using a bone density scan.  If you are 61 years of age or older, or if you are at risk for osteoporosis and fractures, ask your health care provider if you should be screened.  Ask your health care provider whether you should take a calcium or vitamin D supplement to lower your risk for osteoporosis.  Menopause may have certain physical symptoms and risks.  Hormone replacement therapy may reduce some of these symptoms and risks. Talk to your health care provider about whether hormone replacement therapy is right for you.  HOME CARE INSTRUCTIONS   Schedule regular health, dental, and eye exams.  Stay current with your immunizations.   Do not use any tobacco products including cigarettes, chewing tobacco, or electronic cigarettes.  If you are pregnant, do not drink alcohol.  If you are breastfeeding, limit how much and how often you drink alcohol.  Limit alcohol intake to no more than 1 drink per day for nonpregnant women. One drink equals 12 ounces of beer, 5 ounces of wine, or 1 ounces of hard liquor.  Do not use street drugs.  Do not share needles.  Ask your health care provider for help if  you need support or information about quitting drugs.  Tell your health care provider if you often feel depressed.  Tell your health care provider if you have ever been abused or do not feel safe at home.   This information is not intended to replace advice given to you by your health care provider. Make sure you discuss any questions you have with your health care provider.   Document Released: 08/24/2010 Document Revised: 03/01/2014 Document Reviewed: 01/10/2013 Elsevier Interactive Patient Education [employer].

## 2015-02-11 NOTE — Progress Notes (Signed)
Pre visit review using our clinic review tool, if applicable. No additional management support is needed unless otherwise documented below in the visit note. 

## 2015-02-11 NOTE — Assessment & Plan Note (Signed)
Encouraged her to consume a low fat diet Lipid Profile and CMET today

## 2015-02-11 NOTE — Assessment & Plan Note (Signed)
Secondary to arthritis Continue Voltaren

## 2015-02-11 NOTE — Assessment & Plan Note (Signed)
Asymptomatic off meds Will check A1C today

## 2015-02-11 NOTE — Assessment & Plan Note (Signed)
Intermittent Continue Imitrex prn

## 2015-02-13 ENCOUNTER — Telehealth: Payer: Self-pay | Admitting: Internal Medicine

## 2015-02-13 NOTE — Telephone Encounter (Signed)
Patient returned Melanie's call about her lab work.  I let patient know she needs a 30 minute office visit to discuss her labs.  Patient scheduled her appointment on 02/25/15.  Patient said she didn't want to hear any bad news before her Birthday.

## 2015-02-19 ENCOUNTER — Ambulatory Visit
Admission: RE | Admit: 2015-02-19 | Discharge: 2015-02-19 | Disposition: A | Discharge status: Home / Self Care | Payer: Medicaid Other | Source: Ambulatory Visit | Attending: Internal Medicine | Admitting: Internal Medicine

## 2015-02-19 ENCOUNTER — Other Ambulatory Visit: Payer: Self-pay | Admitting: Internal Medicine

## 2015-02-19 DIAGNOSIS — N6453 Retraction of nipple: Secondary | ICD-10-CM

## 2015-02-20 ENCOUNTER — Ambulatory Visit
Admission: RE | Admit: 2015-02-20 | Discharge: 2015-02-20 | Disposition: A | Discharge status: Home / Self Care | Payer: Medicaid Other | Source: Ambulatory Visit | Attending: Internal Medicine | Admitting: Internal Medicine

## 2015-02-20 ENCOUNTER — Other Ambulatory Visit: Payer: Self-pay | Admitting: Internal Medicine

## 2015-02-20 DIAGNOSIS — N6453 Retraction of nipple: Secondary | ICD-10-CM

## 2015-02-21 ENCOUNTER — Ambulatory Visit
Admission: RE | Admit: 2015-02-21 | Discharge: 2015-02-21 | Disposition: A | Discharge status: Home / Self Care | Payer: Medicaid Other | Source: Ambulatory Visit | Attending: Internal Medicine | Admitting: Internal Medicine

## 2015-02-21 DIAGNOSIS — N6453 Retraction of nipple: Secondary | ICD-10-CM

## 2015-02-25 ENCOUNTER — Ambulatory Visit (INDEPENDENT_AMBULATORY_CARE_PROVIDER_SITE_OTHER): Payer: Medicaid Other | Admitting: Internal Medicine

## 2015-02-25 ENCOUNTER — Encounter: Payer: Self-pay | Admitting: Internal Medicine

## 2015-02-25 VITALS — BP 130/80 | HR 74 | Temp 98.4°F | Wt 158.0 lb

## 2015-02-25 DIAGNOSIS — C50911 Malignant neoplasm of unspecified site of right female breast: Secondary | ICD-10-CM | POA: Diagnosis not present

## 2015-02-25 DIAGNOSIS — E119 Type 2 diabetes mellitus without complications: Secondary | ICD-10-CM | POA: Diagnosis not present

## 2015-02-25 DIAGNOSIS — E1165 Type 2 diabetes mellitus with hyperglycemia: Secondary | ICD-10-CM

## 2015-02-25 DIAGNOSIS — C50311 Malignant neoplasm of lower-inner quadrant of right female breast: Secondary | ICD-10-CM | POA: Insufficient documentation

## 2015-02-25 DIAGNOSIS — Z170 Estrogen receptor positive status [ER+]: Secondary | ICD-10-CM

## 2015-02-25 DIAGNOSIS — E785 Hyperlipidemia, unspecified: Secondary | ICD-10-CM | POA: Diagnosis not present

## 2015-02-25 NOTE — Progress Notes (Signed)
Subjective:    Patient ID: Emily Wilson, female    DOB: Jul 08, 1965, 50 y.o.   MRN: [id number]  HPI  Pt presents to the clinic today to follow up lab results. She had an A1C of 7.1%, LDL of 186 and triglycerides of 276. She did have gestational diabetes and it runs on both sides of her family. She reports she has never been treated for hyperlipidemia. She has not been consuming a low fat diet. She used to take Flax Seed Oil but hasn't even been doing that here lately. She is here to discuss treatment options.  She also reports she was recently diagnosed with right side breast cancer. She meets with the surgeon later this month. She has no idea if she will be having chemo and radiation.  Review of Systems      Past Medical History  Diagnosis Date  . Anxiety state, unspecified   . 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 Hyperthyroidism   . Smoker   . Heart murmur   . Hyperlipidemia     Current Outpatient Prescriptions  Medication Sig Dispense Refill  . ALPRAZolam (XANAX) 0.5 MG tablet TAKE ONE (1) TABLET BY MOUTH DAILY AS NEEDED FOR AXNIETY 30 tablet 0  . diclofenac (VOLTAREN) 75 MG EC tablet Take 75 mg by mouth 2 (two) times daily.    Marland Kitchen estradiol (ESTRACE) 0.5 MG tablet TAKE 1 TABLET BY MOUTH DAILY (Patient taking differently: TAKE 1 TABLET every other day) 30 tablet 2  . oxyCODONE (ROXICODONE) 5 MG immediate release tablet Take 1 tablet (5 mg total) by mouth 2 (two) times daily as needed for severe pain. 10 tablet 0   No current facility-administered medications for this visit.    Allergies  Allergen Reactions  . Codeine     hives  . Hydrocodone-Acetaminophen     hives  . Meloxicam Other (See Comments)    Abdominal pain   . Naproxen     Cant breath  . Propoxyphene N-Acetaminophen     hives  . Sulfonamide Derivatives     Hives   . Tramadol Other (See Comments)    Abdominal Pain    Family  History  Problem Relation Age of Onset  . Cancer Mother   . Diabetes Mother   . Cancer Father   . COPD Maternal Grandmother   . Stroke Maternal Grandfather     Social History   Social History  . Marital Status: Divorced    Spouse Name: N/A  . Number of Children: N/A  . Years of Education: N/A   Occupational History  . Cashier    Social History Main Topics  . Smoking status: Former Smoker -- 0.50 packs/day    Types: Cigarettes    Quit date: 10/24/2011  . Smokeless tobacco: Not on file  . Alcohol Use: No  . Drug Use: Yes  . Sexual Activity: Not Currently   Other Topics Concern  . Not on file   Social History Narrative   In [place] w/boyfriend   Works [employer], Scientist, water quality     Constitutional: Denies fever, malaise, fatigue, headache or abrupt weight changes.  Respiratory: Denies difficulty breathing, shortness of breath, cough or sputum production.   Cardiovascular: Denies chest pain, chest tightness, palpitations or swelling in the hands or feet.  Neurological: Denies dizziness, difficulty with memory, difficulty with speech or problems with balance and coordination.    No other specific complaints in a  complete review of systems (except as listed in HPI above).  Objective:   Physical Exam   BP 130/80 mmHg  Pulse 74  Temp(Src) 98.4 F (36.9 C) (Oral)  Wt 158 lb (71.668 kg)  SpO2 98% Wt Readings from Last 3 Encounters:  02/25/15 158 lb (71.668 kg)  02/11/15 158 lb 8 oz (71.895 kg)  01/28/15 161 lb (73.029 kg)    General: Appears her stated age, well developed, well nourished in NAD. Cardiovascular: Normal rate and rhythm. S1,S2 noted.  No murmur, rubs or gallops noted. No JVD or BLE edema. No carotid bruits noted. Pulmonary/Chest: Normal effort and positive vesicular breath sounds. No respiratory distress. No wheezes, rales or ronchi noted.  Neurological: Alert and oriented.  Psychiatric: Mood and affect normal. Behavior is normal. Judgment and thought content  normal.    BMET    Component Value Date/Time   NA 140 02/11/2015 0900   K 4.3 02/11/2015 0900   CL 104 02/11/2015 0900   CO2 28 02/11/2015 0900   GLUCOSE 138* 02/11/2015 0900   BUN 13 02/11/2015 0900   CREATININE 0.72 02/11/2015 0900   CALCIUM 9.7 02/11/2015 0900   GFRNONAA >60 07/14/2010 1050   GFRAA  07/14/2010 1050    >60        The eGFR has been calculated using the MDRD equation. This calculation has not been validated in all clinical situations. eGFR's persistently <60 mL/min signify possible Chronic Kidney Disease.    Lipid Panel     Component Value Date/Time   CHOL 272* 02/11/2015 0900   TRIG 276.0* 02/11/2015 0900   HDL 45.00 02/11/2015 0900   CHOLHDL 6 02/11/2015 0900   VLDL 55.2* 02/11/2015 0900    CBC    Component Value Date/Time   WBC 6.9 02/11/2015 0900   RBC 4.69 02/11/2015 0900   HGB 14.6 02/11/2015 0900   HCT 43.7 02/11/2015 0900   PLT 367.0 02/11/2015 0900   MCV 93.1 02/11/2015 0900   MCH 32.3 07/14/2010 1050   MCHC 33.4 02/11/2015 0900   RDW 12.8 02/11/2015 0900   LYMPHSABS 3.5 07/14/2010 1050   MONOABS 0.8 07/14/2010 1050   EOSABS 0.2 07/14/2010 1050   BASOSABS 0.1 07/14/2010 1050    Hgb A1C Lab Results  Component Value Date   HGBA1C 7.4* 02/11/2015        Assessment & Plan:

## 2015-02-25 NOTE — Progress Notes (Signed)
Pre visit review using our clinic review tool, if applicable. No additional management support is needed unless otherwise documented below in the visit note. 

## 2015-02-25 NOTE — Assessment & Plan Note (Signed)
She meets with surgery next week She will keep me updated about the plan

## 2015-02-25 NOTE — Assessment & Plan Note (Signed)
Encouraged her to consume a low carb, low fat diet She would like to try 3 months of lifestyle therapy before starting on medication Handout given for diet info LDL goal < 100  RTC in 3 months to reevaluate

## 2015-02-25 NOTE — Assessment & Plan Note (Signed)
Discussed low carb, low fat diet Discussed diabetes and standards of medical care Discussed treatment options- she does not want to start medication at this time. She would rather try 3 months of lifestyle changes first Sent in meter and strips for her, she will log BS and bring with her to her next appt She declines flu shot today Pneumovax UTD Encouraged her to get yearly eye exams  RTC in 3 months to reevaluate

## 2015-02-25 NOTE — Patient Instructions (Signed)
Diabetes and Standards of Medical Care Diabetes is complicated. You may find that your diabetes team includes a dietitian, nurse, diabetes educator, eye doctor, and more. To help everyone know what is going on and to help you get the care you deserve, the following schedule of care was developed to help keep you on track. Below are the tests, exams, vaccines, medicines, education, and plans you will need. HbA1c test This test shows how well you have controlled your glucose over the past 2-3 months. It is used to see if your diabetes management plan needs to be adjusted.   It is performed at least 2 times a year if you are meeting treatment goals.  It is performed 4 times a year if therapy has changed or if you are not meeting treatment goals. Blood pressure test  This test is performed at every routine medical visit. The goal is less than 140/90 mm Hg for most people, but 130/80 mm Hg in some cases. Ask your health care provider about your goal. Dental exam  Follow up with the dentist regularly. Eye exam  If you are diagnosed with type 1 diabetes as a child, get an exam upon reaching the age of 80 years or older and having had diabetes for 3-5 years. Yearly eye exams are recommended after that initial eye exam.  If you are diagnosed with type 1 diabetes as an adult, get an exam within 5 years of diagnosis and then yearly.  If you are diagnosed with type 2 diabetes, get an exam as soon as possible after the diagnosis and then yearly. Foot care exam  Visual foot exams are performed at every routine medical visit. The exams check for cuts, injuries, or other problems with the feet.  You should have a complete foot exam performed every year. This exam includes an inspection of the structure and skin of your feet, a check of the pulses in your feet, and a check of the sensation in your feet.  Type 1 diabetes: The first exam is performed 5 years after diagnosis.  Type 2 diabetes: The first  exam is performed at the time of diagnosis.  Check your feet nightly for cuts, injuries, or other problems with your feet. Tell your health care provider if anything is not healing. Kidney function test (urine microalbumin)  This test is performed once a year.  Type 1 diabetes: The first test is performed 5 years after diagnosis.  Type 2 diabetes: The first test is performed at the time of diagnosis.  A serum creatinine and estimated glomerular filtration rate (eGFR) test is done once a year to assess the level of chronic kidney disease (CKD), if present. Lipid profile (cholesterol, HDL, LDL, triglycerides)  Performed every 5 years for most people.  The goal for LDL is less than 100 mg/dL. If you are at high risk, the goal is less than 70 mg/dL.  The goal for HDL is 40 mg/dL-50 mg/dL for men and 50 mg/dL-60 mg/dL for women. An HDL cholesterol of 60 mg/dL or higher gives some protection against heart disease.  The goal for triglycerides is less than 150 mg/dL. Immunizations  The flu (influenza) vaccine is recommended yearly for every person 30 months of age or older who has diabetes.  The pneumonia (pneumococcal) vaccine is recommended for every person 38 years of age or older who has diabetes. Adults 57 years of age or older may receive the pneumonia vaccine as a series of two separate shots.  The hepatitis B  vaccine is recommended for adults shortly after they have been diagnosed with diabetes.  The Tdap (tetanus, diphtheria, and pertussis) vaccine should be given:  According to normal childhood vaccination schedules, for children.  Every 10 years, for adults who have diabetes. Diabetes self-management education  Education is recommended at diagnosis and ongoing as needed. Treatment plan  Your treatment plan is reviewed at every medical visit.   This information is not intended to replace advice given to you by your health care provider. Make sure you discuss any questions you  have with your health care provider.   Document Released: 12/06/2008 Document Revised: 03/01/2014 Document Reviewed: 07/11/2012 Elsevier Interactive Patient Education 2016 Elsevier Inc.  

## 2015-02-26 MED ORDER — ONETOUCH ULTRA 2 W/DEVICE KIT: 1.0000 | PACK | Freq: Once | Status: DC

## 2015-02-26 MED ORDER — GLUCOSE BLOOD VI STRP: 1.0000 | ORAL_STRIP | Freq: Three times a day (TID) | Status: DC

## 2015-02-26 MED ORDER — ONETOUCH ULTRASOFT LANCETS MISC: 1.0000 | Freq: Three times a day (TID) | Status: DC

## 2015-02-26 NOTE — Addendum Note (Signed)
Addended by: Lurlean Nanny on: 02/26/2015 11:58 AM   Modules accepted: Orders

## 2015-03-03 ENCOUNTER — Telehealth: Payer: Self-pay | Admitting: Hematology and Oncology

## 2015-03-03 ENCOUNTER — Other Ambulatory Visit: Payer: Self-pay | Admitting: Surgery

## 2015-03-03 DIAGNOSIS — C50911 Malignant neoplasm of unspecified site of right female breast: Secondary | ICD-10-CM

## 2015-03-03 NOTE — Telephone Encounter (Signed)
New breast appt-s/w patient and gave np appt for 1/16 @ 1 w/Dr. Lindi Adie, 01/19 @ 9 w/Kayla Boggs.  Referring Dr. Ninfa Linden

## 2015-03-05 ENCOUNTER — Telehealth: Payer: Self-pay | Admitting: *Deleted

## 2015-03-05 NOTE — Telephone Encounter (Signed)
Mailed new pt packet to pt.  

## 2015-03-06 NOTE — Progress Notes (Signed)
Location of Breast Cancer: Right Breast  Histology per Pathology Report:  02/20/15 Diagnosis Breast, right, needle core biopsy, 5 o'clock retroareolar - INVASIVE DUCTAL CARCINOMA, MSBR GRADE 2. - PERINEURAL INVASION PRESENT.  Receptor Status: ER(90%), PR (70%), Her2-neu (NEG)  Did patient present with symptoms or was this found on screening mammography?:  The patient had felt a lump in the right breast almost a year ago. She thought it was benign and neglected it. It started to slowly increase in size was examined by her primary care physician who then subsequently obtain a mammogram.   Past/Anticipated interventions by surgeon, if any: She saw Dr. Ninfa Linden on 02/28/15 and he ordered the MRI scheduled for 03/07/15 and he will see her back for recommendations. She has no appointment scheduled with her surgeon at this time. She does report Dr. Lindi Adie was going to call to discuss her care with Dr. Ninfa Linden.   Past/Anticipated interventions by medical oncology, if any: Per Dr. Geralyn Flash notes from 03/10/15: Recommendations: staging CT scans and bone scan  1. Mastectomy followed by 2. Oncotype DX testing to determine if chemotherapy would be of any benefit followed by 3. Adjuvant radiation therapy (if lymph nodes were involved) followed by 4. Adjuvant antiestrogen therapy  Lymphedema issues, if any:  No  Pain issues, if any:  She complains of pain which she states "are needle like feelings" from her right nipple to her back.   SAFETY ISSUES:  Prior radiation? No  Pacemaker/ICD? No  Possible current pregnancy? No  Is the patient on methotrexate? No  Current Complaints / other details:   03/07/15 MRI    Malmfelt, Stephani Police, RN 03/06/2015,12:14 PM

## 2015-03-07 ENCOUNTER — Ambulatory Visit
Admission: RE | Admit: 2015-03-07 | Discharge: 2015-03-07 | Disposition: A | Discharge status: Home / Self Care | Payer: Medicaid Other | Source: Ambulatory Visit | Attending: Surgery | Admitting: Surgery

## 2015-03-07 ENCOUNTER — Encounter: Payer: Self-pay | Admitting: Internal Medicine

## 2015-03-07 DIAGNOSIS — C50911 Malignant neoplasm of unspecified site of right female breast: Secondary | ICD-10-CM

## 2015-03-07 MED ORDER — GADOBENATE DIMEGLUMINE 529 MG/ML IV SOLN
15.0000 mL | Freq: Once | INTRAVENOUS | Status: AC | PRN
  Administered 2015-03-07: 15 mL via INTRAVENOUS

## 2015-03-10 ENCOUNTER — Encounter: Payer: Self-pay | Admitting: Hematology and Oncology

## 2015-03-10 ENCOUNTER — Encounter: Payer: Self-pay | Admitting: *Deleted

## 2015-03-10 ENCOUNTER — Ambulatory Visit (HOSPITAL_BASED_OUTPATIENT_CLINIC_OR_DEPARTMENT_OTHER): Payer: Medicaid Other | Admitting: Hematology and Oncology

## 2015-03-10 VITALS — BP 134/83 | HR 96 | Temp 97.9°F | Resp 18 | Wt 159.3 lb

## 2015-03-10 DIAGNOSIS — C50111 Malignant neoplasm of central portion of right female breast: Secondary | ICD-10-CM | POA: Diagnosis present

## 2015-03-10 DIAGNOSIS — C50311 Malignant neoplasm of lower-inner quadrant of right female breast: Secondary | ICD-10-CM

## 2015-03-10 NOTE — Progress Notes (Signed)
[facility] NOTE  Patient Care Team: Jearld Fenton, NP as PCP - General (Internal Medicine)  CHIEF COMPLAINTS/PURPOSE OF CONSULTATION:  Newly diagnosed breast cancer  HISTORY OF PRESENTING ILLNESS:  Emily Wilson 49 y.o. female is here because of recent diagnosis of Right breast cancer. Patient had felt a lump in the right breast almost a year ago. She thought it was benign and neglected it. It started to slowly increase in size was examined by her primary care physician who then subsequently obtain a mammogram. The mammogram revealed a 2.2 cm mass with architectural distortion and skin/nipple retraction. The MRI also revealed several level I right axillary lymph nodes with cortical thickening maximum 7 mm in size. It is unclear if these are pathologic.  I reviewed her records extensively and collaborated the history with the patient.  SUMMARY OF ONCOLOGIC HISTORY:   Breast cancer of lower-inner quadrant of right female breast ([place])   02/19/2015 Mammogram Right breast irregular mass lower inner quadrant 2.2 x 1.3 x 1.7 cm with architectural distortion and skin/nipple retraction, T2 N0 stage II a clinical stage, additional benign cysts largest 1.2 cm   02/20/2015 Initial Diagnosis Right breast biopsy 5:00: Invasive ductal carcinoma grade 2, perineural invasion present, ER 90%, PR 70%, HER-2 negative ratio 0.95, Ki-67 15%    03/07/2015 Breast MRI right breast inferior subareolar mass measuring 2.2 x 2.4 x 2.5 cm with skin and nipple enhancement, extending inferiorly and laterally is intraductal enhancement over a distance of 4 cm, several level I right axillary lymph nodes with cortical thickening    MEDICAL HISTORY:  Past Medical History  Diagnosis Date  . Anxiety state, unspecified   . 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 Hyperthyroidism   . Smoker   . Heart murmur   . Hyperlipidemia      SURGICAL HISTORY: Past Surgical History  Procedure Laterality Date  . Abdominal hysterectomy    . Tubal ligation    . Appendectomy      SOCIAL HISTORY: Social History   Social History  . Marital Status: Divorced    Spouse Name: N/A  . Number of Children: N/A  . Years of Education: N/A   Occupational History  . Cashier    Social History Main Topics  . Smoking status: Former Smoker -- 0.50 packs/day    Types: Cigarettes    Quit date: 10/24/2011  . Smokeless tobacco: Not on file  . Alcohol Use: No  . Drug Use: Yes  . Sexual Activity: Not Currently   Other Topics Concern  . Not on file   Social History Narrative   In [place] w/boyfriend   Works [employer], Scientist, water quality    FAMILY HISTORY: Family History  Problem Relation Age of Onset  . Cancer Mother   . Diabetes Mother   . Cancer Father   . COPD Maternal Grandmother   . Stroke Maternal Grandfather     ALLERGIES:  is allergic to codeine; hydrocodone-acetaminophen; meloxicam; naproxen; propoxyphene n-acetaminophen; sulfonamide derivatives; and tramadol.  MEDICATIONS:  Current Outpatient Prescriptions  Medication Sig Dispense Refill  . ALPRAZolam (XANAX) 0.5 MG tablet TAKE ONE (1) TABLET BY MOUTH DAILY AS NEEDED FOR AXNIETY 30 tablet 0  . Blood Glucose Monitoring Suppl (ONE TOUCH ULTRA 2) w/Device KIT 1 Device by Does not apply route once. 1 each 0  . diclofenac (VOLTAREN) 75 MG EC tablet Take 75 mg by mouth 2 (two) times daily.    Marland Kitchen  estradiol (ESTRACE) 0.5 MG tablet TAKE 1 TABLET BY MOUTH DAILY (Patient taking differently: TAKE 1 TABLET every other day) 30 tablet 2  . glucose blood (ONE TOUCH TEST STRIPS) test strip 1 each by Other route 3 (three) times daily. 100 each 12  . Lancets (ONETOUCH ULTRASOFT) lancets 1 each by Other route 3 (three) times daily. 100 each 12  . oxyCODONE (ROXICODONE) 5 MG immediate release tablet Take 1 tablet (5 mg total) by mouth 2 (two) times daily as needed for severe pain. 10 tablet 0    No current facility-administered medications for this visit.    REVIEW OF SYSTEMS:   Constitutional: Denies fevers, chills or abnormal night sweats Eyes: Denies blurriness of vision, double vision or watery eyes Ears, nose, mouth, throat, and face: Denies mucositis or sore throat Respiratory: Denies cough, dyspnea or wheezes Cardiovascular: Denies palpitation, chest discomfort or lower extremity swelling Gastrointestinal:  Denies nausea, heartburn or change in bowel habits Skin: Denies abnormal skin rashes Lymphatics: Denies new lymphadenopathy or easy bruising Neurological:Denies numbness, tingling or new weaknesses Behavioral/Psych: Mood is stable, no new changes  Breast: skin and nipple retraction and lump in the breast All other systems were reviewed with the patient and are negative.  PHYSICAL EXAMINATION: ECOG PERFORMANCE STATUS: 1 - Symptomatic but completely ambulatory  Filed Vitals:   03/10/15 1309  BP: 134/83  Pulse: 96  Temp: 97.9 F (36.6 C)  Resp: 18   Filed Weights   03/10/15 1309  Weight: 159 lb 4.8 oz (72.258 kg)    GENERAL:alert, no distress and comfortable SKIN: skin color, texture, turgor are normal, no rashes or significant lesions EYES: normal, conjunctiva are pink and non-injected, sclera clear OROPHARYNX:no exudate, no erythema and lips, buccal mucosa, and tongue normal  NECK: supple, thyroid normal size, non-tender, without nodularity LYMPH:  no palpable lymphadenopathy in the cervical, axillary or inguinal LUNGS: clear to auscultation and percussion with normal breathing effort HEART: regular rate & rhythm and no murmurs and no lower extremity edema ABDOMEN:abdomen soft, non-tender and normal bowel sounds Musculoskeletal:no cyanosis of digits and no clubbing  PSYCH: alert & oriented x 3 with fluent speech NEURO: no focal motor/sensory deficits BREAST:large palpable lump in the right breast with skin or nipple retraction no obvious evidence of  inflammatory breast cancer. No palpable axillary or supraclavicular lymphadenopathy (exam performed in the presence of a chaperone)   LABORATORY DATA:  I have reviewed the data as listed Lab Results  Component Value Date   WBC 6.9 02/11/2015   HGB 14.6 02/11/2015   HCT 43.7 02/11/2015   MCV 93.1 02/11/2015   PLT 367.0 02/11/2015   Lab Results  Component Value Date   NA 140 02/11/2015   K 4.3 02/11/2015   CL 104 02/11/2015   CO2 28 02/11/2015    RADIOGRAPHIC STUDIES: I have personally reviewed the radiological reports and agreed with the findings in the report.  ASSESSMENT AND PLAN:  Breast cancer of lower-inner quadrant of right female breast ([place]) Right breast irregular mass lower inner quadrant retroareolar 02/19/2015: 2.2 x 1.3 x 1.7 cm with architectural distortion and skin/nipple retraction, T2 N0 stage II a clinical stage, additional benign cysts largest 1.2 cm Right breast biopsy 02/20/2015 5:00: Invasive ductal carcinoma grade 2, perineural invasion present, ER 90%, PR 70%, HER-2 negative ratio 0.95, Ki-67 15%   Breast MRI 03/07/2015: Subareolar mass 2.2 x 2.4 x 2.5 cm extending into the retracted nipple, extending inferiorly and laterally over a distance of 4 cm, several level  I right axillary lymph nodes with cortical thickening up to 7 mm with a maximum diameter of 15 mm  Pathology and radiology counseling:Discussed with the patient, the details of pathology including the type of breast cancer,the clinical staging, the significance of ER, PR and HER-2/neu receptors and the implications for treatment. After reviewing the pathology in detail, we proceeded to discuss the different treatment options between surgery, radiation, chemotherapy, antiestrogen therapies.  Recommendations: staging CT scans and bone scan  1. Mastectomy followed by 2. Oncotype DX testing to determine if chemotherapy would be of any benefit followed by 3. Adjuvant radiation therapy (if lymph nodes  were involved) followed by 4. Adjuvant antiestrogen therapy  Oncotype counseling: I discussed Oncotype DX test. I explained to the patient that this is a 21 gene panel to evaluate patient tumors DNA to calculate recurrence score. This would help determine whether patient has high risk or intermediate risk or low risk breast cancer. She understands that if her tumor was found to be high risk, she would benefit from systemic chemotherapy. If low risk, no need of chemotherapy. If she was found to be intermediate risk, we would need to evaluate the score as well as other risk factors and determine if an abbreviated chemotherapy may be of benefit.  Return to clinic after surgery to discuss final pathology report and then determine if Oncotype DX testing will need to be sent.  All questions were answered. The patient knows to call the clinic with any problems, questions or concerns.    Rulon Eisenmenger, MD 03/10/2015

## 2015-03-10 NOTE — Assessment & Plan Note (Addendum)
Right breast irregular mass lower inner quadrant retroareolar 02/19/2015: 2.2 x 1.3 x 1.7 cm with architectural distortion and skin/nipple retraction, T2 N0 stage II a clinical stage, additional benign cysts largest 1.2 cm Right breast biopsy 02/20/2015 5:00: Invasive ductal carcinoma grade 2, perineural invasion present, ER 90%, PR 70%, HER-2 negative ratio 0.95, Ki-67 15%  Breast MRI 03/07/2015: Subareolar mass 2.2 x 2.4 x 2.5 cm extending into the retracted nipple, extending inferiorly and laterally over a distance of 4 cm, several level I right axillary lymph nodes with cortical thickening up to 7 mm with a maximum diameter of 15 mm  Pathology and radiology counseling:Discussed with the patient, the details of pathology including the type of breast cancer,the clinical staging, the significance of ER, PR and HER-2/neu receptors and the implications for treatment. After reviewing the pathology in detail, we proceeded to discuss the different treatment options between surgery, radiation, chemotherapy, antiestrogen therapies.  Recommendations: 1. Breast conserving surgery vs mastectomy followed by 2. Oncotype DX testing to determine if chemotherapy would be of any benefit followed by 3. Adjuvant radiation therapy (if breast conserving surgery) followed by 4. Adjuvant antiestrogen therapy  Oncotype counseling: I discussed Oncotype DX test. I explained to the patient that this is a 21 gene panel to evaluate patient tumors DNA to calculate recurrence score. This would help determine whether patient has high risk or intermediate risk or low risk breast cancer. She understands that if her tumor was found to be high risk, she would benefit from systemic chemotherapy. If low risk, no need of chemotherapy. If she was found to be intermediate risk, we would need to evaluate the score as well as other risk factors and determine if an abbreviated chemotherapy may be of benefit.  Return to clinic after surgery to  discuss final pathology report and then determine if Oncotype DX testing will need to be sent.

## 2015-03-11 ENCOUNTER — Telehealth: Payer: Self-pay | Admitting: Hematology and Oncology

## 2015-03-11 ENCOUNTER — Encounter: Payer: Medicaid Other | Admitting: Genetic Counselor

## 2015-03-11 ENCOUNTER — Other Ambulatory Visit: Payer: Medicaid Other

## 2015-03-11 NOTE — Progress Notes (Signed)
Radiation Oncology         [phone] ________________________________  Initial outpatient Consultation  Name: Emily Wilson MRN: [id number]  Date: 03/12/2015  DOB: [birth year]  YN:WGNFA, [name], NP  Jearld Fenton, NP   REFERRING PHYSICIAN: Jearld Fenton, NP  DIAGNOSIS:    ICD-9-CM ICD-10-CM   1. 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 female who presented with a mass in her right breast for about a year. Her PCP noted this and ordered mammogram at the end of 2016.  This showed bilateral breast masses. [place] confirmed the left mass to be a cyst.  [place] measured right breast mass to be 2.2cm with a 2 cm separate component extending toward nipple.  This mass was suspicious  Biopsy of right breast per Pathology Report: INVASIVE DUCTAL CARCINOMA,  GRADE 2. - PERINEURAL INVASION PRESENT.  See characteristics above in diagnosis.   MRI scheduled on 03/07/15 revealed a 2.5 cm right breast mass with a 4cm component extending toward nipple that was also suspicious.  Nodes in the R axilla were not enlarged but somewhat suspicious based on contours/cortices.  Plan is for mastectomy, SLN bx, and reconstruction - though timing of reconstruction unknown  Per Dr. Geralyn Flash notes from 03/10/15: Recommendations: staging CT scans and bone scan 1. Mastectomy followed by 2. Oncotype DX testing to determine if chemotherapy would be of any benefit followed by 3. Adjuvant radiation therapy (if lymph nodes were involved) followed by 4. Adjuvant antiestrogen therapy  Lymphedema issues, if any:  No  Pain issues, if any:  She complains of pain which she states "are needle like feelings" from her right nipple to her back.   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 No  Pacemaker/ICD? No  Possible current pregnancy? No  Is the patient on methotrexate? 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    PAST SURGICAL HISTORY: Past Surgical History  Procedure Laterality Date  . Abdominal hysterectomy    . Tubal ligation    . Appendectomy      FAMILY HISTORY: family history includes COPD in her maternal grandmother; Cancer in her father and mother; Diabetes in her mother; Stroke in her maternal grandfather.  SOCIAL HISTORY:  reports that she quit smoking about 3 years ago. Her smoking use included Cigarettes. She smoked 0.50 packs per day. She does not have any smokeless tobacco history on file. She reports that she uses illicit drugs. She reports that she does not drink alcohol.  ALLERGIES: Codeine; Hydrocodone-acetaminophen; Meloxicam; Naproxen; Propoxyphene n-acetaminophen; Sulfonamide derivatives; and Tramadol  MEDICATIONS:  Current Outpatient Prescriptions  Medication Sig Dispense Refill  . ALPRAZolam (XANAX) 0.5 MG tablet TAKE ONE (1) TABLET BY MOUTH DAILY AS NEEDED FOR AXNIETY 30 tablet 0  . Blood Glucose Monitoring Suppl (ONE TOUCH ULTRA 2) w/Device KIT 1 Device by Does not apply route once. 1 each 0  . glucose blood (ONE TOUCH TEST STRIPS) test strip 1 each by Other route 3 (three) times daily. 100 each 12  . Lancets (ONETOUCH ULTRASOFT) lancets 1  each by Other route 3 (three) times daily. 100 each 12  . diclofenac (VOLTAREN) 75 MG EC tablet Take 75 mg by mouth 2 (two) times daily.    Marland Kitchen oxyCODONE (ROXICODONE) 5 MG immediate release tablet Take 1 tablet (5 mg total) by mouth 2 (two) times daily as needed for severe pain. (Patient not taking: Reported on 03/12/2015) 10 tablet 0   No current  facility-administered medications for this encounter.    REVIEW OF SYSTEMS:  Notable for that above.   PHYSICAL EXAM:  height is _0  (1.499 m) and weight is 159 lb 14.4 oz (72.53 kg). Her temperature is 98.1 F (36.7 C). Her blood pressure is 106/75 and her pulse is 87.   General: Alert and oriented, in no acute distress HEENT: Head is normocephalic. Extraocular movements are intact. Oropharynx is clear. Neck: Neck is supple, no palpable cervical or supraclavicular lymphadenopathy. Heart: Regular in rate and rhythm with no murmurs, rubs, or gallops. Chest: Clear to auscultation bilaterally, with no rhonchi, wheezes, or rales. Abdomen: Soft, nontender, nondistended, with no rigidity or guarding. Extremities: No cyanosis or edema. Lymphatics: see Neck Exam Skin: No concerning lesions. Musculoskeletal: symmetric strength and muscle tone throughout. Neurologic: Cranial nerves II through XII are grossly intact. No obvious focalities. Speech is fluent. Coordination is intact. Psychiatric: Judgment and insight are intact. Affect is appropriate. Breasts: LIQ mass at 5:30 oclock in right breast, about 4cm in size.  Nipple retraction.  Indentation at site of old scar from biopsy years ago, at 6:00 . No other palpable masses appreciated in the breasts or axillae    ECOG = 0  0 - Asymptomatic (Fully active, able to carry on all predisease activities without restriction)  1 - Symptomatic but completely ambulatory (Restricted in physically strenuous activity but ambulatory and able to carry out work of a light or sedentary nature. For example, light housework, office work)  2 - Symptomatic, <50% in bed during the day (Ambulatory and capable of all self care but unable to carry out any work activities. Up and about more than 50% of waking hours)  3 - Symptomatic, >50% in bed, but not bedbound (Capable of only limited self-care, confined to bed or chair 50% or more of waking hours)  4 - Bedbound  (Completely disabled. Cannot carry on any self-care. Totally confined to bed or chair)  5 - Death   Eustace Pen MM, Creech RH, Tormey DC, et al. [phone]). "Toxicity and response criteria of the [employer]". Tiger Oncol. 5 (6): 649-55   LABORATORY DATA:  Lab Results  Component Value Date   WBC 6.9 02/11/2015   HGB 14.6 02/11/2015   HCT 43.7 02/11/2015   MCV 93.1 02/11/2015   PLT 367.0 02/11/2015   CMP     Component Value Date/Time   NA 140 02/11/2015 0900   K 4.3 02/11/2015 0900   CL 104 02/11/2015 0900   CO2 28 02/11/2015 0900   GLUCOSE 138* 02/11/2015 0900   BUN 13 02/11/2015 0900   CREATININE 0.72 02/11/2015 0900   CALCIUM 9.7 02/11/2015 0900   PROT 7.0 02/11/2015 0900   ALBUMIN 4.0 02/11/2015 0900   AST 32 02/11/2015 0900   ALT 49* 02/11/2015 0900   ALKPHOS 110 02/11/2015 0900   BILITOT 0.5 02/11/2015 0900   GFRNONAA >60 07/14/2010 1050   GFRAA  07/14/2010 1050    >60        The eGFR has been calculated using the MDRD equation. This calculation  has not been validated in all clinical situations. eGFR's persistently <60 mL/min signify possible Chronic Kidney Disease.         RADIOGRAPHY: Mr Breast Bilateral W Wo Contrast  03/07/2015  CLINICAL DATA:  Invasive ductal carcinoma right breast lower inner quadrant LABS:  n/a EXAM: BILATERAL BREAST MRI WITH AND WITHOUT CONTRAST TECHNIQUE: Multiplanar, multisequence MR images of both breasts were obtained prior to and following the intravenous administration of 15 ml of MultiHance. THREE-DIMENSIONAL MR IMAGE RENDERING ON INDEPENDENT WORKSTATION: Three-dimensional MR images were rendered by post-processing of the original MR data on an independent workstation. The three-dimensional MR images were interpreted, and findings are reported in the following complete MRI report for this study. Three dimensional images were evaluated at the independent DynaCad workstation COMPARISON:  Previous exam(s). FINDINGS:  Breast composition: d. Extreme fibroglandular tissue. Background parenchymal enhancement: Marked Right breast: Associated with biopsy marker clip, there is an inferior subareolar mass measuring 22 x 24 x 25 mm. Mass is oval in shape with markedly irregular margins, with enhancement extending into the retracted nipple, which also demonstrate enhancement. Extending inferiorly and laterally away from the mass is intraductal enhancement over a distance of 4 cm. Left breast: No mass or abnormal enhancement. As seen on recent ultrasound there is fibrocystic change in the left breast predominantly in the lower outer quadrant. Lymph nodes: There are no intramammary lymph nodes. There are no enlarged lymph nodes. There are several level 1 right axillary lymph nodes which demonstrate cortical thickening. The most prominent of these shows cortical thickening up to 7 mm, with maximal lymph node diameter of 15 mm. This lymph node demonstrates a lobulated contour. Ancillary findings:  None. IMPRESSION: Known invasive carcinoma in the retroareolar right breast, with associated nipple inversion and enhancement extending into the nipple. There is also evidence of an intraductal component extending 4 cm away from the mass peripherally into the lower outer quadrant. No definitive abnormal lymph node although several level 1 right axillary lymph nodes demonstrate cortical thickening which is concerning for the possibility of metastasis. RECOMMENDATION: Treatment planning for known right breast malignancy. If treatment would be altered by confirmation of associated intraductal carcinoma extending 4cm away from the dominant mass, this finding would be amenable to MRI-guided core biopsy. BI-RADS CATEGORY  6: Known biopsy-proven malignancy. Electronically Signed   By: Skipper Cliche M.D.   On: 03/07/2015 15:33   Mm Digital Diagnostic Unilat R  02/20/2015  CLINICAL DATA:  Status post ultrasound-guided biopsy of a right breast mass.  EXAM: DIAGNOSTIC RIGHT MAMMOGRAM POST ULTRASOUND BIOPSY COMPARISON:  Previous exam(s). FINDINGS: Mammographic images were obtained following ultrasound guided biopsy of the right breast mass, 5 o'clock axis, retroareolar. At the conclusion of the procedure, a ribbon shaped tissue marker was placed at the biopsy site. The biopsy clip is well positioned along the posterior margin of the targeted mass. IMPRESSION: Postprocedure mammogram for clip placement. Biopsy clip is adequately positioned at the posterior margin of the right breast mass. Final Assessment: Post Procedure Mammograms for Marker Placement Electronically Signed   By: Franki Cabot M.D.   On: 02/20/2015 12:47   Mm Radiologist Eval And Mgmt  02/28/2015  ADDENDUM REPORT: 02/28/2015 08:30 ADDENDUM: The ASSESSMENT AND PLAN section should read as follows: Approximate 2.2 cm invasive ductal carcinoma in the lower inner periareolar RIGHT breast. No pathologic right axillary lymphadenopathy by ultrasound. Stage II by imaging. Electronically Signed   By: Evangeline Dakin M.D.   On: 02/28/2015 08:30  02/28/2015  EXAM: ESTABLISHED  PATIENT OFFICE VISIT - LEVEL II CHIEF COMPLAINT: One day post ultrasound-guided core needle biopsy of a suspicious mass in the lower inner periareolar right breast. Patient presents for discussion of the pathology results. Current Pain Level: 2 HISTORY OF PRESENT ILLNESS: Palpable mass in the lower right breast associated with skin retraction and nipple retraction. The mass measured approximately 2.2 cm on ultrasound. The patient underwent ultrasound-guided core needle biopsy yesterday. EXAM: Biopsy site is clean and dry.  There is no palpable hematoma. PATHOLOGY: GRADE 2 INVASIVE DUCTAL CARCINOMA. ASSESSMENT AND PLAN: ASSESSMENT AND PLAN Assessment: Approximate 2.2 cm invasive ductal carcinoma in the lower inner periareolar left breast. No pathologic right axillary lymphadenopathy by ultrasound. Stage II by imaging. Recommendation:  Surgical consultation for excision. An appointment has been made with Dr. Rush Farmer of [facility] on 02/28/2015 at 9:15 a.m. The patient was given reading material regarding newly diagnosed breast cancer and she was encouraged to call the [facility] with any questions she may have or if she has any issues related to the biopsy. Electronically Signed: By: Evangeline Dakin M.D. On: 02/21/2015 15:40   US Breast Ltd Uni Left Inc Axilla  02/19/2015  CLINICAL DATA:  Patient presents today with a palpable right breast mass with overlying skin/nipple retraction. EXAM: DIGITAL DIAGNOSTIC BILATERAL MAMMOGRAM WITH 3D TOMOSYNTHESIS WITH CAD ULTRASOUND BILATERAL BREAST COMPARISON:  No prior exams.  This is patient's first mammogram. ACR Breast Density Category c: The breast tissue is heterogeneously dense, which may obscure small masses. FINDINGS: Bilateral CC and MLO projections were obtained with 3D tomosynthesis. A tangential spot compression view was obtained for the right breast, corresponding to the palpable abnormality, with overlying skin marker in place. Additional magnification views were obtained for left breast calcifications. There is an irregular mass within the lower inner quadrant of the right breast, retroareolar, with associated architectural distortion and overlying skin/nipple retraction. The distortion is best seen on tomosynthesis CC slice 24 and MLO slice 37. Within the left breast, there is an oval circumscribed mass within the lower outer quadrant, at posterior depth, measuring approximately 1.4 cm greatest dimension. No other dominant masses within the left breast. There are scattered benign-appearing calcifications within the upper central left breast. No suspicious calcifications are identified within the left breast. Mammographic images were processed with CAD. Targeted ultrasound is performed of the RIGHT breast showing an irregular hypoechoic mass at the 5  o'clock axis, 2 cm from nipple, with internal vascularity and extension towards the nipple. The dominant component measures 2.2 x 1.3 x 1.7 cm. The component extending anteriorly toward the nipple measures an additional 2 x 1.6 cm. Next, targeted ultrasound is performed of the LEFT breast showing several benign cysts within the outer left breast, largest at the 4 o'clock axis measuring 1.2 x 0.5 x 0.9 cm, corresponding to the mammographic finding. No suspicious solid or cystic masses are identified within the outer left breast. Right axilla was also evaluated with ultrasound showing no enlarged or morphologically abnormal lymph nodes. IMPRESSION: 1. Irregular mass within the lower inner quadrant of the right breast, 5 o'clock axis, 2 cm from the nipple, with main component measuring 2.2 x 1.3 x 1.7 cm, with associated architectural distortion and overlying skin/nipple retraction. This is a highly suspicious finding for which ultrasound-guided core biopsy is recommended. 2.   No evidence of malignancy within the left breast. RECOMMENDATION: Ultrasound-guided core biopsy for the right breast mass. Ultrasound-guided biopsy is scheduled for December 29th at 12 p.m.  I have discussed the findings and recommendations with the patient. Results were also provided in writing at the conclusion of the visit. If applicable, a reminder letter will be sent to the patient regarding the next appointment. BI-RADS CATEGORY  5: Highly suggestive of malignancy. Electronically Signed   By: Franki Cabot M.D.   On: 02/19/2015 13:49   US Breast Ltd Uni Right Inc Axilla  02/19/2015  CLINICAL DATA:  Patient presents today with a palpable right breast mass with overlying skin/nipple retraction. EXAM: DIGITAL DIAGNOSTIC BILATERAL MAMMOGRAM WITH 3D TOMOSYNTHESIS WITH CAD ULTRASOUND BILATERAL BREAST COMPARISON:  No prior exams.  This is patient's first mammogram. ACR Breast Density Category c: The breast tissue is heterogeneously dense,  which may obscure small masses. FINDINGS: Bilateral CC and MLO projections were obtained with 3D tomosynthesis. A tangential spot compression view was obtained for the right breast, corresponding to the palpable abnormality, with overlying skin marker in place. Additional magnification views were obtained for left breast calcifications. There is an irregular mass within the lower inner quadrant of the right breast, retroareolar, with associated architectural distortion and overlying skin/nipple retraction. The distortion is best seen on tomosynthesis CC slice 24 and MLO slice 37. Within the left breast, there is an oval circumscribed mass within the lower outer quadrant, at posterior depth, measuring approximately 1.4 cm greatest dimension. No other dominant masses within the left breast. There are scattered benign-appearing calcifications within the upper central left breast. No suspicious calcifications are identified within the left breast. Mammographic images were processed with CAD. Targeted ultrasound is performed of the RIGHT breast showing an irregular hypoechoic mass at the 5 o'clock axis, 2 cm from nipple, with internal vascularity and extension towards the nipple. The dominant component measures 2.2 x 1.3 x 1.7 cm. The component extending anteriorly toward the nipple measures an additional 2 x 1.6 cm. Next, targeted ultrasound is performed of the LEFT breast showing several benign cysts within the outer left breast, largest at the 4 o'clock axis measuring 1.2 x 0.5 x 0.9 cm, corresponding to the mammographic finding. No suspicious solid or cystic masses are identified within the outer left breast. Right axilla was also evaluated with ultrasound showing no enlarged or morphologically abnormal lymph nodes. IMPRESSION: 1. Irregular mass within the lower inner quadrant of the right breast, 5 o'clock axis, 2 cm from the nipple, with main component measuring 2.2 x 1.3 x 1.7 cm, with associated architectural  distortion and overlying skin/nipple retraction. This is a highly suspicious finding for which ultrasound-guided core biopsy is recommended. 2.   No evidence of malignancy within the left breast. RECOMMENDATION: Ultrasound-guided core biopsy for the right breast mass. Ultrasound-guided biopsy is scheduled for December 29th at 12 p.m. I have discussed the findings and recommendations with the patient. Results were also provided in writing at the conclusion of the visit. If applicable, a reminder letter will be sent to the patient regarding the next appointment. BI-RADS CATEGORY  5: Highly suggestive of malignancy. Electronically Signed   By: Franki Cabot M.D.   On: 02/19/2015 13:49   Mm Diag Breast Tomo Bilateral  02/19/2015  CLINICAL DATA:  Patient presents today with a palpable right breast mass with overlying skin/nipple retraction. EXAM: DIGITAL DIAGNOSTIC BILATERAL MAMMOGRAM WITH 3D TOMOSYNTHESIS WITH CAD ULTRASOUND BILATERAL BREAST COMPARISON:  No prior exams.  This is patient's first mammogram. ACR Breast Density Category c: The breast tissue is heterogeneously dense, which may obscure small masses. FINDINGS: Bilateral CC and MLO projections were obtained with  3D tomosynthesis. A tangential spot compression view was obtained for the right breast, corresponding to the palpable abnormality, with overlying skin marker in place. Additional magnification views were obtained for left breast calcifications. There is an irregular mass within the lower inner quadrant of the right breast, retroareolar, with associated architectural distortion and overlying skin/nipple retraction. The distortion is best seen on tomosynthesis CC slice 24 and MLO slice 37. Within the left breast, there is an oval circumscribed mass within the lower outer quadrant, at posterior depth, measuring approximately 1.4 cm greatest dimension. No other dominant masses within the left breast. There are scattered benign-appearing calcifications  within the upper central left breast. No suspicious calcifications are identified within the left breast. Mammographic images were processed with CAD. Targeted ultrasound is performed of the RIGHT breast showing an irregular hypoechoic mass at the 5 o'clock axis, 2 cm from nipple, with internal vascularity and extension towards the nipple. The dominant component measures 2.2 x 1.3 x 1.7 cm. The component extending anteriorly toward the nipple measures an additional 2 x 1.6 cm. Next, targeted ultrasound is performed of the LEFT breast showing several benign cysts within the outer left breast, largest at the 4 o'clock axis measuring 1.2 x 0.5 x 0.9 cm, corresponding to the mammographic finding. No suspicious solid or cystic masses are identified within the outer left breast. Right axilla was also evaluated with ultrasound showing no enlarged or morphologically abnormal lymph nodes. IMPRESSION: 1. Irregular mass within the lower inner quadrant of the right breast, 5 o'clock axis, 2 cm from the nipple, with main component measuring 2.2 x 1.3 x 1.7 cm, with associated architectural distortion and overlying skin/nipple retraction. This is a highly suspicious finding for which ultrasound-guided core biopsy is recommended. 2.   No evidence of malignancy within the left breast. RECOMMENDATION: Ultrasound-guided core biopsy for the right breast mass. Ultrasound-guided biopsy is scheduled for December 29th at 12 p.m. I have discussed the findings and recommendations with the patient. Results were also provided in writing at the conclusion of the visit. If applicable, a reminder letter will be sent to the patient regarding the next appointment. BI-RADS CATEGORY  5: Highly suggestive of malignancy. Electronically Signed   By: Franki Cabot M.D.   On: 02/19/2015 13:49   [place] Rt Breast Bx W Loc Dev 1st Lesion Img Bx Spec US Guide  02/21/2015  ADDENDUM REPORT: 02/21/2015 15:33 ADDENDUM: PATHOLOGY: GRADE 2 INVASIVE DUCTAL  CARCINOMA. CONCORDANT:  Yes. Please see the separate report of the followup evaluation and management visit for recommendations. Electronically Signed   By: Evangeline Dakin M.D.   On: 02/21/2015 15:33  02/21/2015  CLINICAL DATA:  Patient with a right breast mass presents today for ultrasound-guided biopsy. EXAM: ULTRASOUND GUIDED RIGHT BREAST CORE NEEDLE BIOPSY COMPARISON:  Previous exam(s). PROCEDURE: I met with the patient and we discussed the procedure of ultrasound-guided biopsy, including benefits and alternatives. We discussed the high likelihood of a successful procedure. We discussed the risks of the procedure including infection, bleeding, tissue injury, clip migration, and inadequate sampling. Informed written consent was given. The usual time-out protocol was performed immediately prior to the procedure. Using sterile technique and 2% Lidocaine as local anesthetic, under direct ultrasound visualization, a 12 gauge spring-loaded device was used to perform biopsy of the irregular mass within the right breast at the 5 o'clock axis, retroareolar,using a lateral approach. At the conclusion of the procedure, a ribbon shaped tissue marker clip was deployed into the biopsy cavity. Follow-up 2-view mammogram  was performed and dictated separately. IMPRESSION: Ultrasound-guided biopsy of the right breast mass. No apparent complications. Electronically Signed: By: Franki Cabot M.D. On: 02/20/2015 12:41      IMPRESSION/PLAN: Today, I talked to the patient about the findings and work-up thus far. We discussed the patient's diagnosis of breast cancer   and general treatment for this, highlighting the role of radiotherapy in the management of post mastectomy patients. We discussed the available radiation techniques, and focused on the details of logistics and delivery.   We discussed the risks, benefits, and side effects of radiotherapy. We discussed risks associated with radiation to a reconstructed breast as  well.  Patient will discuss with her surgeon the timing of reconstruction. Our team will review the pathology at tumor board and look for possible risk factors to decide if RT is indicated (tumor size >>5cm, + margin status, + node status).  Consent signed today.  I would appreciate she be referred back to me,  if RT is indicated, following chemotherapy.   (If chemo isn't given  I can see her a month or so after surgery is complete). She knows to call if any questions arise in the future.      __________________________________________   Eppie Gibson, MD

## 2015-03-11 NOTE — Progress Notes (Signed)
Note created by Dr. Gudena during office visit, copy to patient,original to scan. 

## 2015-03-11 NOTE — Telephone Encounter (Signed)
Central radiology will call patient re scans. No other orders per 1/16 pof.

## 2015-03-12 ENCOUNTER — Ambulatory Visit
Admission: RE | Admit: 2015-03-12 | Discharge: 2015-03-12 | Disposition: A | Discharge status: Home / Self Care | Payer: Medicaid Other | Source: Ambulatory Visit | Attending: Radiation Oncology | Admitting: Radiation Oncology

## 2015-03-12 ENCOUNTER — Encounter: Payer: Self-pay | Admitting: Radiation Oncology

## 2015-03-12 VITALS — BP 106/75 | HR 87 | Temp 98.1°F | Ht 59.0 in | Wt 159.9 lb

## 2015-03-12 DIAGNOSIS — Z170 Estrogen receptor positive status [ER+]: Secondary | ICD-10-CM | POA: Insufficient documentation

## 2015-03-12 DIAGNOSIS — R011 Cardiac murmur, unspecified: Secondary | ICD-10-CM | POA: Diagnosis not present

## 2015-03-12 DIAGNOSIS — Z836 Family history of other diseases of the respiratory system: Secondary | ICD-10-CM | POA: Insufficient documentation

## 2015-03-12 DIAGNOSIS — Z809 Family history of malignant neoplasm, unspecified: Secondary | ICD-10-CM | POA: Diagnosis not present

## 2015-03-12 DIAGNOSIS — Z823 Family history of stroke: Secondary | ICD-10-CM | POA: Diagnosis not present

## 2015-03-12 DIAGNOSIS — Z833 Family history of diabetes mellitus: Secondary | ICD-10-CM | POA: Diagnosis not present

## 2015-03-12 DIAGNOSIS — E0590 Thyrotoxicosis, unspecified without thyrotoxic crisis or storm: Secondary | ICD-10-CM | POA: Insufficient documentation

## 2015-03-12 DIAGNOSIS — E785 Hyperlipidemia, unspecified: Secondary | ICD-10-CM | POA: Diagnosis not present

## 2015-03-12 DIAGNOSIS — Z87891 Personal history of nicotine dependence: Secondary | ICD-10-CM | POA: Insufficient documentation

## 2015-03-12 DIAGNOSIS — F419 Anxiety disorder, unspecified: Secondary | ICD-10-CM | POA: Insufficient documentation

## 2015-03-12 DIAGNOSIS — F1990 Other psychoactive substance use, unspecified, uncomplicated: Secondary | ICD-10-CM | POA: Diagnosis not present

## 2015-03-12 DIAGNOSIS — C50311 Malignant neoplasm of lower-inner quadrant of right female breast: Secondary | ICD-10-CM

## 2015-03-13 ENCOUNTER — Ambulatory Visit (HOSPITAL_BASED_OUTPATIENT_CLINIC_OR_DEPARTMENT_OTHER): Payer: Medicaid Other | Admitting: Genetic Counselor

## 2015-03-13 ENCOUNTER — Encounter: Payer: Self-pay | Admitting: Genetic Counselor

## 2015-03-13 ENCOUNTER — Other Ambulatory Visit: Payer: Medicaid Other

## 2015-03-13 DIAGNOSIS — Z8052 Family history of malignant neoplasm of bladder: Secondary | ICD-10-CM | POA: Diagnosis not present

## 2015-03-13 DIAGNOSIS — Z809 Family history of malignant neoplasm, unspecified: Secondary | ICD-10-CM

## 2015-03-13 DIAGNOSIS — Z315 Encounter for genetic counseling: Secondary | ICD-10-CM

## 2015-03-13 DIAGNOSIS — C50311 Malignant neoplasm of lower-inner quadrant of right female breast: Secondary | ICD-10-CM | POA: Diagnosis not present

## 2015-03-13 DIAGNOSIS — Z803 Family history of malignant neoplasm of breast: Secondary | ICD-10-CM

## 2015-03-13 NOTE — Progress Notes (Signed)
REFERRING PROVIDER: [name], MD  OTHER PROVIDER(S): Eppie Gibson, MD Coralie Keens, MD  PRIMARY PROVIDER:  Webb Silversmith, NP  PRIMARY REASON FOR VISIT:  1. Breast cancer of lower-inner quadrant of right female breast ([place])   2. Family history of breast cancer in female   3. Family history of bladder cancer   4. Family history of cancer      HISTORY OF PRESENT ILLNESS:   Emily Wilson, a 50 y.o. female, was seen for a [place] cancer genetics consultation at the request of Dr. Lindi Adie due to a personal history of breast cancer at age 1 and family history of breast and other cancers.  Emily Wilson presents to clinic today to discuss the possibility of a hereditary predisposition to cancer, genetic testing, and to further clarify her future cancer risks, as well as potential cancer risks for family members.   In December 2016, at the age of 56, Emily Wilson was diagnosed with invasive ductal carcinoma of the right breast.  Hormone receptor status was ER/PR+, Her2-.  This will be treated with mastectomy, adjuvant chemotherapy and/or radiation pending Oncotype Dx and lymph node status post-surgery, and antiestrogen therapy.   CANCER HISTORY:    Breast cancer of lower-inner quadrant of right female breast ([place])   02/19/2015 Mammogram Right breast irregular mass lower inner quadrant 2.2 x 1.3 x 1.7 cm with architectural distortion and skin/nipple retraction, T2 N0 stage II a clinical stage, additional benign cysts largest 1.2 cm   02/20/2015 Initial Diagnosis Right breast biopsy 5:00: Invasive ductal carcinoma grade 2, perineural invasion present, ER 90%, PR 70%, HER-2 negative ratio 0.95, Ki-67 15%    03/07/2015 Breast MRI right breast inferior subareolar mass measuring 2.2 x 2.4 x 2.5 cm with skin and nipple enhancement, extending inferiorly and laterally is intraductal enhancement over a distance of 4 cm, several level I right axillary lymph nodes with cortical thickening      HORMONAL RISK FACTORS:  Menarche was at age 88.  First live birth at age 15.  OCP use for approximately less than 2 years. Ovaries intact: yes.  Hysterectomy: history of partial hysterectomy at age 50.  Menopausal status: postmenopausal.  HRT use: estradiol for 1 year or less. Colonoscopy: no; not examined. Mammogram within the last year: this was her first mammogram, per her report Number of breast biopsies: 2, history of a biopsy in her 70s with normal follow-up Up to date with pelvic exams:  no. Any excessive radiation exposure in the past:  No, but does report some additional secondhand smoke  Past Medical History  Diagnosis Date  . Anxiety state, unspecified   . 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 Hyperthyroidism   . Smoker   . Heart murmur   . Hyperlipidemia     Past Surgical History  Procedure Laterality Date  . Abdominal hysterectomy    . Tubal ligation    . Appendectomy      Social History   Social History  . Marital Status: Divorced    Spouse Name: N/A  . Number of Children: N/A  . Years of Education: N/A   Occupational History  . Cashier    Social History Main Topics  . Smoking status: Former Smoker -- 3.00 packs/day for 30 years    Types: Cigarettes    Quit date: 10/24/2011  . Smokeless tobacco: Never Used  . Alcohol Use: No  . Drug Use: Yes  .  Sexual Activity: Not Currently   Other Topics Concern  . None   Social History Narrative   In LTR w/boyfriend   Works Chiropractor, Scientist, water quality     FAMILY HISTORY:  We obtained a detailed, 4-generation family history.  Significant diagnoses are listed below: Family History  Problem Relation Age of Onset  . Diabetes Mother   . Bladder Cancer Mother 39    smoker  . Other Mother 58    TAH for unspecified reason  . Multiple sclerosis Mother   . Cancer Father 68    lymphatic/tonsil cancer - in remission; former smoker  . COPD  Maternal Grandmother     smoker  . Emphysema Maternal Grandmother     smoker  . Stroke Maternal Grandfather   . Dementia Maternal Uncle   . Diabetes Paternal Grandmother   . COPD Maternal Uncle     smoker  . Multiple sclerosis Paternal Aunt   . Breast cancer Paternal Aunt     dx. late 20s - early 20s    Emily Wilson has two sons, ages 75 and 20.  She has no grandchildren currently.  She has one full sister who is currently 5 and has never had cancer.  She also has one paternal half-sister and one paternal half-brother with whom she has limited contact but for whom she reports no known cancers.  Emily Wilson has three maternal half-brothers and one maternal half-sister.  One of these maternal half-brothers died of suicide at the age of 70.  The remaining maternal half-siblings are in their late 50s-60s and have never had cancer.  Emily Wilson mother is currently 53; she is a smoker and has a history of bladder cancer at 6.  Emily Wilson mother also has a history of a total hysterectomy at the age of 100 for an unknown reason and has a diagnosis of multiple sclerosis.  Emily Wilson father is currently 14 and in remission for a "lymphatic and tonsil cancer" diagnosed at age 6.  He is a former smoker.    Emily Wilson father has three full sisters and two full brothers--all of whom have passed away save for one brother who is currently 2.  The majority of siblings have passed away in their 52s-late 80s and have never had cancer.  One of Emily Wilson aunts died of breast cancer in her late 40s-early 45s.  She has one son and one daughter, neither have had cancer.  Emily Wilson paternal grandmother died in her 43s and her grandfather died at 61.  She has no further information for any paternal great aunts/uncles or great grandparents.  Emily Wilson is unaware of any additional cancer on her mother's side.  Emily Wilson mother has one full brother and one full sister, both of whom are in their late  65s.  Emily Wilson maternal grandmother, a smoker, died of emphysema and COPD at the age of 50.  Emily Wilson maternal grandfather died of a massive stroke at the age of 80.  She has no further information for an maternal great aunts/uncles or great grandparents.    Patient's maternal ancestors are of Native [place] and Caucasian descent, and paternal ancestors are of [place] and [place] descent. There is no reported Ashkenazi Jewish ancestry. There is no known consanguinity.  GENETIC COUNSELING ASSESSMENT: Emily Wilson is a 50 y.o. female with a personal and family history of breast cancer which is somewhat suggestive of a hereditary breast cancer syndrome and predisposition to cancer. We, therefore, discussed and  recommended the following at today's visit.   DISCUSSION: We reviewed the characteristics, features and inheritance patterns of hereditary cancer syndromes. We also discussed genetic testing, including the appropriate family members to test, and the process of genetic testing.  We discussed the benefits of genetic testing which include additional personal and familial medical management options based on identified increased genetic cancer risks.  At this time, Emily Wilson reports that she is not interested in genetic testing at this time, but she has our contact information, in the event that she changes her mind.    PLAN: Thus, despite our recommendation, Emily Wilson did not wish to pursue genetic testing at today's visit. We understand this decision, and remain available to coordinate genetic testing at any time in the future. We, therefore, recommend Ms. Kasson continue to follow the cancer screening guidelines given by her oncology and primary healthcare providers.  Lastly, we encouraged Ms. Caldwell to remain in contact with cancer genetics annually so that we can continuously update the family history and inform her of any changes in cancer genetics and testing that may be of benefit  for this family.   Ms.  Devargas questions were answered to her satisfaction today. Our contact information was provided should additional questions or concerns arise. Thank you for the referral and allowing [place] to share in the care of your patient.   Jeanine Luz, MS Genetic Counselor [email or web address] phone: [phone]  The patient was seen for a total of 50 minutes in face-to-face genetic counseling.  This patient was discussed with Drs. Magrinat, Lindi Adie and/or Burr Medico who agrees with the above.    _______________________________________________________________________ For Office Staff:  Number of people involved in session: 1 Was an Intern/ student involved with case: no

## 2015-03-14 ENCOUNTER — Other Ambulatory Visit: Payer: Self-pay | Admitting: Surgery

## 2015-03-17 ENCOUNTER — Ambulatory Visit (HOSPITAL_COMMUNITY): Payer: Medicaid Other

## 2015-03-18 ENCOUNTER — Telehealth: Payer: Self-pay | Admitting: Internal Medicine

## 2015-03-18 NOTE — Telephone Encounter (Signed)
Patient Name: Emily Wilson DOB: 05/01/1965 Initial Comment Caller states her blood sugar is 413 and feeling dizzy, trying to manage diabetes with diet, may need medication, Nurse Assessment Nurse: Ronnald Ramp, RN, Miranda Date/Time (Eastern Time): 03/18/2015 2:15:51 PM Confirm and document reason for call. If symptomatic, describe symptoms. You must click the next button to save text entered. ---Caller states she has diabetes. She was seen by provider 2 weeks ago and was recommended she start on medication but told the provider she wanted to try to manage with diet and exercise. Her BS has been ok but today about 1 hr after she ate, she started feeling weak, dizzy, and shaky. SHe checked her BS and it was 419. Has the patient traveled out of the country within the last 30 days? ---Not Applicable Does the patient have any new or worsening symptoms? ---Yes Will a triage be completed? ---Yes Related visit to physician within the last 2 weeks? ---Yes Does the PT have any chronic conditions? (i.e. diabetes, asthma, etc.) ---Yes List chronic conditions. ---Recently diagnosed with cancer Did the patient indicate they were pregnant? ---No Is this a behavioral health or substance abuse call? ---No Guidelines Guideline Title Affirmed Question Affirmed Notes Diabetes - High Blood Sugar Blood glucose > 400 mg/dl (22 mmol/l) Final Disposition User Call PCP Now Ronnald Ramp, RN, Miranda Comments No appts available today with PCP or at PCP office. BS was checked within 1 hour of eating. Told caller to drink 1 glass of water every hr for the next 4 hours and recheck BS in 2 hrs. If still > 400 or symptoms worsen to call back. Scheduled appt with Webb Silversmith tomorrow at 2pm. Also told caller I would forward message to the provider today

## 2015-03-18 NOTE — Telephone Encounter (Signed)
Pt has appt 03/19/15 at 2 pm with Avie Echevaria NP.

## 2015-03-19 ENCOUNTER — Encounter (HOSPITAL_COMMUNITY): Payer: Medicaid Other

## 2015-03-19 ENCOUNTER — Encounter: Payer: Self-pay | Admitting: Internal Medicine

## 2015-03-19 ENCOUNTER — Ambulatory Visit (INDEPENDENT_AMBULATORY_CARE_PROVIDER_SITE_OTHER): Payer: Medicaid Other | Admitting: Internal Medicine

## 2015-03-19 VITALS — BP 126/82 | HR 95 | Temp 98.6°F | Wt 154.0 lb

## 2015-03-19 DIAGNOSIS — E785 Hyperlipidemia, unspecified: Secondary | ICD-10-CM

## 2015-03-19 DIAGNOSIS — E119 Type 2 diabetes mellitus without complications: Secondary | ICD-10-CM | POA: Diagnosis not present

## 2015-03-19 LAB — GLUCOSE, POCT (MANUAL RESULT ENTRY): POC Glucose: 155 mg/dl — AB (ref 70–99)

## 2015-03-19 MED ORDER — METFORMIN HCL 500 MG PO TABS: 500.0000 mg | ORAL_TABLET | Freq: Two times a day (BID) | ORAL | Status: DC

## 2015-03-19 NOTE — Progress Notes (Signed)
Pre visit review using our clinic review tool, if applicable. No additional management support is needed unless otherwise documented below in the visit note. 

## 2015-03-19 NOTE — Patient Instructions (Signed)

## 2015-03-19 NOTE — Progress Notes (Signed)
Subjective:    Patient ID: Emily Wilson, female    DOB: April 15, 1965, 50 y.o.   MRN: [id number]  HPI  Pt presents to the clinic to discuss starting medication for elevated blood sugars. She has been monitoring her sugars at home and reports a range from 179 in the morning before breakfast up to 418 2 hours after a meal.  Eating 1-2 meals a day. Pt reports headaches, fatigue, dizziness and blurred vision only yesterday. Denies polyuria, tingling in extremeties. Her last A1C was 7.4%, her last LDL was 186 and her triglycerides were 276. She had gestational diabetes and it runs on both sides of her family. She has never been treated for her hyperlipidemia. She was counseled on a low fat diet at her last appointment on 02/25/15. She still drinks tea, but has cut the sugar in preparing it. Has cut out a lot of other sugars and carbs. Consuming rye bread. She has not taken anything OTC for her headache. She does not want to take any medication for her cholesterol. States there are too many side effects, high cholesterol runs in her family. Used to use Flax Seed but not anymore, willing to try this again. Eye exam 12/2014.    Review of Systems  Past Medical History  Diagnosis Date  . Anxiety state, unspecified   . 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 Hyperthyroidism   . Smoker   . Heart murmur   . Hyperlipidemia     Current Outpatient Prescriptions  Medication Sig Dispense Refill  . ALPRAZolam (XANAX) 0.5 MG tablet TAKE ONE (1) TABLET BY MOUTH DAILY AS NEEDED FOR AXNIETY 30 tablet 0  . Blood Glucose Monitoring Suppl (ONE TOUCH ULTRA 2) w/Device KIT 1 Device by Does not apply route once. 1 each 0  . diclofenac (VOLTAREN) 75 MG EC tablet Take 75 mg by mouth 2 (two) times daily.    Marland Kitchen glucose blood (ONE TOUCH TEST STRIPS) test strip 1 each by Other route 3 (three) times daily. 100 each 12  . Lancets (ONETOUCH ULTRASOFT) lancets  1 each by Other route 3 (three) times daily. 100 each 12  . oxyCODONE (ROXICODONE) 5 MG immediate release tablet Take 1 tablet (5 mg total) by mouth 2 (two) times daily as needed for severe pain. 10 tablet 0   No current facility-administered medications for this visit.    Allergies  Allergen Reactions  . Codeine     hives  . Hydrocodone-Acetaminophen     hives  . Meloxicam Other (See Comments)    Abdominal pain   . Naproxen     Cant breath  . Propoxyphene N-Acetaminophen     hives  . Sulfonamide Derivatives     Hives   . Tramadol Other (See Comments)    Abdominal Pain    Family History  Problem Relation Age of Onset  . Diabetes Mother   . Bladder Cancer Mother 52    smoker  . Other Mother 51    TAH for unspecified reason  . Multiple sclerosis Mother   . Cancer Father 82    lymphatic/tonsil cancer - in remission; former smoker  . COPD Maternal Grandmother     smoker  . Emphysema Maternal Grandmother     smoker  . Stroke Maternal Grandfather   . Dementia Maternal Uncle   . Diabetes Paternal Grandmother   . COPD Maternal Uncle     smoker  .  Multiple sclerosis Paternal Aunt   . Breast cancer Paternal Aunt     dx. late 74s - early 14s    Social History   Social History  . Marital Status: Divorced    Spouse Name: N/A  . Number of Children: N/A  . Years of Education: N/A   Occupational History  . Cashier    Social History Main Topics  . Smoking status: Former Smoker -- 3.00 packs/day for 30 years    Types: Cigarettes    Quit date: 10/24/2011  . Smokeless tobacco: Never Used  . Alcohol Use: No  . Drug Use: Yes  . Sexual Activity: Not Currently   Other Topics Concern  . 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Works [employer], Scientist, water quality     Constitutional: Positive headache and fatigue. Denies fever or malaise. HEENT: Positive blurred vision.   Cardiovascular: Denies chest pain, chest tightness, or swelling in the hands or  feet.  GU: Denies urgency, frequency, or polyuria.  Skin: Denies redness, rashes, lesions or ulcercations.  Neurological: Positive dizziness.   No other specific complaints in a complete review of systems (except as listed in HPI above).     Objective:   Physical Exam BP 126/82 mmHg  Pulse 95  Temp(Src) 98.6 F (37 C) (Oral)  Wt 154 lb (69.854 kg)  SpO2 97% Wt Readings from Last 3 Encounters:  03/19/15 154 lb (69.854 kg)  03/12/15 159 lb 14.4 oz (72.53 kg)  03/10/15 159 lb 4.8 oz (72.258 kg)    General: Appears her stated age,  in NAD. Skin: Warm, dry and intact. No rashes, lesions or ulcerations noted. Cardiovascular: Normal rate and rhythm. S1,S2 noted.  No murmur, rubs or gallops noted.  No carotid bruits noted. Dorsalis pedis pulse 2+ B/L. No edema in LEs.  Pulmonary/Chest: Normal effort and positive vesicular breath sounds. No respiratory distress. No wheezes, rales or ronchi noted.   Neuro: Sensation intact LEs bilaterally.    BMET    Component Value Date/Time   NA 140 02/11/2015 0900   K 4.3 02/11/2015 0900   CL 104 02/11/2015 0900   CO2 28 02/11/2015 0900   GLUCOSE 138* 02/11/2015 0900   BUN 13 02/11/2015 0900   CREATININE 0.72 02/11/2015 0900   CALCIUM 9.7 02/11/2015 0900   GFRNONAA >60 07/14/2010 1050   GFRAA  07/14/2010 1050    >60        The eGFR has been calculated using the MDRD equation. This calculation has not been validated in all clinical situations. eGFR's persistently <60 mL/min signify possible Chronic Kidney Disease.    Lipid Panel     Component Value Date/Time   CHOL 272* 02/11/2015 0900   TRIG 276.0* 02/11/2015 0900   HDL 45.00 02/11/2015 0900   CHOLHDL 6 02/11/2015 0900   VLDL 55.2* 02/11/2015 0900    CBC    Component Value Date/Time   WBC 6.9 02/11/2015 0900   RBC 4.69 02/11/2015 0900   HGB 14.6 02/11/2015 0900   HCT 43.7 02/11/2015 0900   PLT 367.0 02/11/2015 0900   MCV 93.1 02/11/2015 0900   MCH 32.3 07/14/2010 1050     MCHC 33.4 02/11/2015 0900   RDW 12.8 02/11/2015 0900   LYMPHSABS 3.5 07/14/2010 1050   MONOABS 0.8 07/14/2010 1050   EOSABS 0.2 07/14/2010 1050   BASOSABS 0.1 07/14/2010 1050    Hgb A1C Lab Results  Component Value Date   HGBA1C 7.4* 02/11/2015  Assessment & Plan:  DM2:  Rx for Metformin 542m BID  Discussed the side effects of Metformin Counseled on continuing a low fat/low carb diet Last eye exam 12/2014 Counseled to do self foot exams periodically  Hyperlipidemia:  Denied initiation of a statin States she will begin taking Flax Seed again  RTC in 3 months for lab only appt

## 2015-03-25 ENCOUNTER — Encounter: Payer: Self-pay | Admitting: *Deleted

## 2015-03-25 ENCOUNTER — Other Ambulatory Visit: Payer: Self-pay | Admitting: Surgery

## 2015-03-25 DIAGNOSIS — C50911 Malignant neoplasm of unspecified site of right female breast: Secondary | ICD-10-CM

## 2015-03-25 NOTE — Progress Notes (Signed)
Emily Wilson  Clinical Social Wilson was referred by patient for assessment of psychosocial needs.  Clinical Social Worker contacted patient at home to offer support and assess for needs.  Patient requested information on support services at [facility].  CSW and patient discussed the support team and support programs at [facility].  Patient expressed interest in breast cancer support group and was also agreeable to an [employer].  CSW and patient also discussed counseling options for her and her 60 year old son.  CSW provided contact information and encouraged patient to call with any additional needs or concerns.        Emily Wilson, MSW, LCSW, OSW-C Clinical Social Worker [facility] [phone]

## 2015-04-01 ENCOUNTER — Other Ambulatory Visit: Payer: Self-pay | Admitting: Surgery

## 2015-04-04 ENCOUNTER — Other Ambulatory Visit: Payer: Self-pay | Admitting: Internal Medicine

## 2015-04-04 NOTE — Telephone Encounter (Signed)
Last filled 10/29/14--please advise

## 2015-04-04 NOTE — Telephone Encounter (Signed)
Rx called in to pharmacy. 

## 2015-04-04 NOTE — Telephone Encounter (Signed)
Ok to phone in Xanax, likely stress due to recent diagnosis of breast CA

## 2015-04-08 ENCOUNTER — Other Ambulatory Visit: Payer: Self-pay | Admitting: Surgery

## 2015-04-28 ENCOUNTER — Encounter: Payer: Self-pay | Admitting: *Deleted

## 2015-04-28 ENCOUNTER — Telehealth: Payer: Self-pay | Admitting: Hematology and Oncology

## 2015-04-28 ENCOUNTER — Other Ambulatory Visit: Payer: Self-pay | Admitting: Hematology and Oncology

## 2015-04-28 DIAGNOSIS — C50311 Malignant neoplasm of lower-inner quadrant of right female breast: Secondary | ICD-10-CM

## 2015-04-28 MED ORDER — TAMOXIFEN CITRATE 20 MG PO TABS: 20.0000 mg | ORAL_TABLET | Freq: Every day | ORAL | Status: DC

## 2015-04-28 NOTE — Progress Notes (Signed)
Emily Wilson  Clinical Social Wilson received phone call from pt. Pt reports she needs to change her emergency contact as her father recently passed away. Pt could not decide who to use as a new contact, so CSW did not change. CSW explained options to assist and options to complete ADRs. Pt was noncommittal and stated she needed to "figure things out" prior to making appointment with CSW. CSW encouraged pt to reach out when she was ready to meet and complete ADRs.    Clinical Social Wilson interventions: ADR education  Emily Wilson, [place] Worker [place]  [place] Phone: [phone] Fax: [phone]

## 2015-04-28 NOTE — Telephone Encounter (Signed)
Patient called to inform us that she does not want to undergo surgery until May. I called her to inquire the reasons behind this. She told me that her father recently passed away. Her father would have been the person to take care of her son when she was to undergo surgery. Since he is no more, she wants to wait until the school year is complete to undergo surgery.  She also would like to discuss bilateral mastectomies as opposed to unilateral surgery.  1. I instructed her to discuss the role of bilateral mastectomies with her surgeon. 2. I recommended starting antiestrogen therapy with tamoxifen 20 mg daily starting today until she undergoes surgery. Patient is agreeable with this.

## 2015-05-19 ENCOUNTER — Encounter: Payer: Self-pay | Admitting: *Deleted

## 2015-05-26 ENCOUNTER — Ambulatory Visit: Payer: Medicaid Other | Admitting: Internal Medicine

## 2015-05-26 ENCOUNTER — Telehealth: Payer: Self-pay | Admitting: *Deleted

## 2015-05-26 NOTE — Telephone Encounter (Signed)
Spoke with patient to follow up.  She states she is still dealing with settling her father's estate.  Her son graduates in May so she will feel better about scheduling her surgery after that so she is not dealing with all this stuff at one time.  She is taking her tamoxifen and tolerating it well.  Gave her contact information for our financial counselor as well as our Education officer, museum.  Encouraged her to call with any questions or concerns.

## 2015-06-17 ENCOUNTER — Other Ambulatory Visit (INDEPENDENT_AMBULATORY_CARE_PROVIDER_SITE_OTHER): Payer: Medicaid Other

## 2015-06-17 DIAGNOSIS — E119 Type 2 diabetes mellitus without complications: Secondary | ICD-10-CM | POA: Diagnosis not present

## 2015-06-17 LAB — LIPID PANEL
Cholesterol: 239 mg/dL — ABNORMAL HIGH (ref 0–200)
HDL: 44.5 mg/dL (ref >39.00)
NonHDL: 194.39
Total CHOL/HDL Ratio: 5
Triglycerides: 266 mg/dL — ABNORMAL HIGH (ref 0.0–149.0)
VLDL: 53.2 mg/dL — ABNORMAL HIGH (ref 0.0–40.0)

## 2015-06-17 LAB — LDL CHOLESTEROL, DIRECT: Direct LDL: 139 mg/dL

## 2015-06-17 LAB — HEMOGLOBIN A1C: Hgb A1c MFr Bld: 6.6 % — ABNORMAL HIGH (ref 4.6–6.5)

## 2015-06-20 ENCOUNTER — Encounter: Payer: Self-pay | Admitting: Internal Medicine

## 2015-06-20 ENCOUNTER — Ambulatory Visit (INDEPENDENT_AMBULATORY_CARE_PROVIDER_SITE_OTHER): Payer: Medicaid Other | Admitting: Internal Medicine

## 2015-06-20 VITALS — BP 124/80 | HR 101 | Temp 98.8°F | Wt 148.5 lb

## 2015-06-20 DIAGNOSIS — E785 Hyperlipidemia, unspecified: Secondary | ICD-10-CM

## 2015-06-20 DIAGNOSIS — E119 Type 2 diabetes mellitus without complications: Secondary | ICD-10-CM

## 2015-06-20 DIAGNOSIS — F411 Generalized anxiety disorder: Secondary | ICD-10-CM

## 2015-06-20 MED ORDER — METFORMIN HCL 500 MG PO TABS: 500.0000 mg | ORAL_TABLET | Freq: Two times a day (BID) | ORAL | Status: DC

## 2015-06-20 MED ORDER — ALPRAZOLAM 0.5 MG PO TABS: ORAL_TABLET | ORAL | Status: DC

## 2015-06-20 NOTE — Patient Instructions (Signed)

## 2015-06-20 NOTE — Assessment & Plan Note (Signed)
Refill xanax for insomnia

## 2015-06-20 NOTE — Progress Notes (Signed)
Pre visit review using our clinic review tool, if applicable. No additional management support is needed unless otherwise documented below in the visit note. 

## 2015-06-20 NOTE — Assessment & Plan Note (Addendum)
Take metformin BID regardless of meals Continue low carb diet Discussed foot health and foot exams Monitor for increased neuropathy  F/u in 6 mo

## 2015-06-20 NOTE — Progress Notes (Signed)
Subjective:    Patient ID: Emily Wilson, female    DOB: 05/12/1965, 50 y.o.   MRN: [id number]  HPI  Pt presents to the clinic today for a 3 mo f/u for DM and HLD. Labs showed a decrease in A1C (6.6), LDL (139), triglycerides (266) and cholesterol (239). Her weight has decreased 6 lb since her last visit. She is currently prescribed Metformin 500 mg BID, but is only taking the second tablet if she is not eating a low carb meal. She checks her BGL 3/day, 2 hrs post-prandial: 121-148. She denies polyuria or polydipsia. She has increased numbness and tingling in both feet. She is not interested in taking any medicine for neuropathy at this time. Last eye exam 12/2014. Pneumovax 2014. Pt does not get the flu shot.   Pt is taking Flax Seed to manage her HLD. She refuses to take a statin due to side effects (has never taken one in the past). She states she cannot take fish oil due to side effects of tachycardia.   Additionally, she would like a refill of Xanax due to anxiety and insomnia following the death of her dad. She only takes it as needed.   Review of Systems     Past Medical History  Diagnosis Date  . Anxiety state, unspecified   . 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 Hyperthyroidism   . Smoker   . Heart murmur   . Hyperlipidemia     Current Outpatient Prescriptions  Medication Sig Dispense Refill  . ALPRAZolam (XANAX) 0.5 MG tablet TAKE ONE (1) TABLET BY MOUTH DAILY AS NEEDED FOR AXNIETY 30 tablet 0  . Blood Glucose Monitoring Suppl (ONE TOUCH ULTRA 2) w/Device KIT 1 Device by Does not apply route once. 1 each 0  . diclofenac (VOLTAREN) 75 MG EC tablet Take 75 mg by mouth 2 (two) times daily.    Marland Kitchen glucose blood (ONE TOUCH TEST STRIPS) test strip 1 each by Other route 3 (three) times daily. 100 each 12  . Lancets (ONETOUCH ULTRASOFT) lancets 1 each by Other route 3 (three) times daily. 100 each 12  .  metFORMIN (GLUCOPHAGE) 500 MG tablet Take 1 tablet (500 mg total) by mouth 2 (two) times daily with a meal. 60 tablet 2  . oxyCODONE (ROXICODONE) 5 MG immediate release tablet Take 1 tablet (5 mg total) by mouth 2 (two) times daily as needed for severe pain. 10 tablet 0  . tamoxifen (NOLVADEX) 20 MG tablet Take 1 tablet (20 mg total) by mouth daily. 90 tablet 3   No current facility-administered medications for this visit.    Allergies  Allergen Reactions  . Codeine     hives  . Hydrocodone-Acetaminophen     hives  . Meloxicam Other (See Comments)    Abdominal pain   . Naproxen     Cant breath  . Propoxyphene N-Acetaminophen     hives  . Sulfonamide Derivatives     Hives   . Tramadol Other (See Comments)    Abdominal Pain    Family History  Problem Relation Age of Onset  . Diabetes Mother   . Bladder Cancer Mother 70    smoker  . Other Mother 57    TAH for unspecified reason  . Multiple sclerosis Mother   . Cancer Father 26    lymphatic/tonsil cancer - in remission; former smoker  . COPD Maternal Grandmother  smoker  . Emphysema Maternal Grandmother     smoker  . Stroke Maternal Grandfather   . Dementia Maternal Uncle   . Diabetes Paternal Grandmother   . COPD Maternal Uncle     smoker  . Multiple sclerosis Paternal Aunt   . Breast cancer Paternal Aunt     dx. late 63s - early 19s    Social History   Social History  . Marital Status: Divorced    Spouse Name: N/A  . Number of Children: N/A  . Years of Education: N/A   Occupational History  . Cashier    Social History Main Topics  . Smoking status: Former Smoker -- 3.00 packs/day for 30 years    Types: Cigarettes    Quit date: 10/24/2011  . Smokeless tobacco: Never Used  . Alcohol Use: No  . Drug Use: Yes  . Sexual Activity: Not Currently   Other Topics Concern  . Not on file   Social History Narrative   In [place] w/boyfriend   Works [employer], Scientist, water quality     Constitutional: Denies fever,  fatigue, headache, or abrupt weight changes.  HEENT: Denies blurred vision. Respiratory: Denies difficulty breathing, shortness of breath, or cough.   Cardiovascular: Denies chest pain, chest tightness, palpitations or swelling in the hands or feet.  Gastrointestinal: Denies abdominal pain, bloating, constipation, or diarrhea.  GU: Denies polyuria. Skin: Denies redness, rashes, lesions or ulcercations.  Neurological: Positive for occasional numbness and tignling of  both feet. Denies dizziness, difficulty with memory, difficulty with speech or problems with balance and coordination.  Psych: Positive for increased stress and difficulty sleeping due to the death of her dad. Denies SI/HI.  No other specific complaints in a complete review of systems (except as listed in HPI above).  Objective:   Physical Exam  BP 124/80 mmHg  Pulse 101  Temp(Src) 98.8 F (37.1 C) (Oral)  Wt 148 lb 8 oz (67.359 kg)  SpO2 97% Wt Readings from Last 3 Encounters:  06/20/15 148 lb 8 oz (67.359 kg)  03/19/15 154 lb (69.854 kg)  03/12/15 159 lb 14.4 oz (72.53 kg)    General: Appears her stated age, obese in NAD. Skin: Warm, dry and intact. Skin cracked at heels, but otherwise intact. Cardiovascular: Normal rate and rhythm. S1,S2 noted.  No murmur, rubs or gallops noted. No JVD or BLE edema. Pulmonary/Chest: Normal effort and positive vesicular breath sounds. No respiratory distress. No wheezes, rales or ronchi noted.  Neurological: Alert and oriented. Sensation intact to BLE. Psychiatric: Mood and affect normal. Behavior is normal.    BMET    Component Value Date/Time   NA 140 02/11/2015 0900   K 4.3 02/11/2015 0900   CL 104 02/11/2015 0900   CO2 28 02/11/2015 0900   GLUCOSE 138* 02/11/2015 0900   BUN 13 02/11/2015 0900   CREATININE 0.72 02/11/2015 0900   CALCIUM 9.7 02/11/2015 0900   GFRNONAA >60 07/14/2010 1050   GFRAA  07/14/2010 1050    >60        The eGFR has been calculated using the  MDRD equation. This calculation has not been validated in all clinical situations. eGFR's persistently <60 mL/min signify possible Chronic Kidney Disease.    Lipid Panel     Component Value Date/Time   CHOL 239* 06/17/2015 1015   TRIG 266.0* 06/17/2015 1015   HDL 44.50 06/17/2015 1015   CHOLHDL 5 06/17/2015 1015   VLDL 53.2* 06/17/2015 1015    CBC    Component Value Date/Time  WBC 6.9 02/11/2015 0900   RBC 4.69 02/11/2015 0900   HGB 14.6 02/11/2015 0900   HCT 43.7 02/11/2015 0900   PLT 367.0 02/11/2015 0900   MCV 93.1 02/11/2015 0900   MCH 32.3 07/14/2010 1050   MCHC 33.4 02/11/2015 0900   RDW 12.8 02/11/2015 0900   LYMPHSABS 3.5 07/14/2010 1050   MONOABS 0.8 07/14/2010 1050   EOSABS 0.2 07/14/2010 1050   BASOSABS 0.1 07/14/2010 1050    Hgb A1C Lab Results  Component Value Date   HGBA1C 6.6* 06/17/2015         Assessment & Plan:

## 2015-06-20 NOTE — Assessment & Plan Note (Signed)
Increase flax seed Continue low fat diet

## 2015-07-24 ENCOUNTER — Telehealth: Payer: Self-pay | Admitting: *Deleted

## 2015-07-24 NOTE — Telephone Encounter (Signed)
Spoke with patient to follow up on appointments and to see if she is ready to start treatment.  She is very hesitant to come in. She states she has a lot of anxiety regarding her diagnosis.  She informed me she is still working on her father's estate and trying to pay off the funeral and that her oldest son is mentally ill, is on drugs and has been stealing. She states she is dealing with a lot and hasn't had time to take care of herself.  After a much discussion about coming in for an appointment I did get her to agree to see Dr. Lindi Adie 6/15 at 9am.  She would like for me to be in the room with her at her visit and I informed her I would be happy to do that.  Encouraged her to call with any needs or concerns.

## 2015-08-06 ENCOUNTER — Telehealth: Payer: Self-pay | Admitting: *Deleted

## 2015-08-06 NOTE — Telephone Encounter (Signed)
Spoke with patient and rescheduled appt with Dr. Lindi Adie to 08/11/15 at 930am.

## 2015-08-11 ENCOUNTER — Other Ambulatory Visit: Payer: Self-pay | Admitting: Hematology and Oncology

## 2015-08-11 ENCOUNTER — Encounter: Payer: Self-pay | Admitting: Hematology and Oncology

## 2015-08-11 ENCOUNTER — Ambulatory Visit (HOSPITAL_BASED_OUTPATIENT_CLINIC_OR_DEPARTMENT_OTHER): Payer: Medicaid Other | Admitting: Hematology and Oncology

## 2015-08-11 ENCOUNTER — Telehealth: Payer: Self-pay | Admitting: *Deleted

## 2015-08-11 ENCOUNTER — Telehealth: Payer: Self-pay | Admitting: Hematology and Oncology

## 2015-08-11 ENCOUNTER — Encounter: Payer: Self-pay | Admitting: *Deleted

## 2015-08-11 VITALS — BP 125/95 | HR 86 | Temp 98.6°F | Resp 18 | Wt 140.0 lb

## 2015-08-11 DIAGNOSIS — C50311 Malignant neoplasm of lower-inner quadrant of right female breast: Secondary | ICD-10-CM | POA: Diagnosis present

## 2015-08-11 MED ORDER — TAMOXIFEN CITRATE 20 MG PO TABS: 20.0000 mg | ORAL_TABLET | Freq: Every day | ORAL | Status: DC

## 2015-08-11 NOTE — Progress Notes (Signed)
Patient Care Team: Jearld Fenton, NP as PCP - General (Internal Medicine)  SUMMARY OF ONCOLOGIC HISTORY:   Breast cancer of lower-inner quadrant of right female breast ([place])   02/19/2015 Mammogram Right breast irregular mass lower inner quadrant 2.2 x 1.3 x 1.7 cm with architectural distortion and skin/nipple retraction, T2 N0 stage II a clinical stage, additional benign cysts largest 1.2 cm   02/20/2015 Initial Diagnosis Right breast biopsy 5:00: Invasive ductal carcinoma grade 2, perineural invasion present, ER 90%, PR 70%, HER-2 negative ratio 0.95, Ki-67 15%    03/07/2015 Breast MRI right breast inferior subareolar mass measuring 2.2 x 2.4 x 2.5 cm with skin and nipple enhancement, extending inferiorly and laterally is intraductal enhancement over a distance of 4 cm, several level I right axillary lymph nodes with cortical thickening   03/13/2015 -  Anti-estrogen oral therapy Neoadjuvant antiestrogen therapy with tamoxifen 20 mg daily (because the patient did not want to undergo surgery immediately for multiple family reasons)    CHIEF COMPLIANT: Follow-up on neoadjuvant tamoxifen therapy  INTERVAL HISTORY: Emily Wilson is a 50 year old with above-mentioned history of right breast cancer stage II a disease that was involving the skin and nipple based upon the breast MRI extending inferiorly and laterally over a 4 cm distance. There were several level I right axillary lymph nodes with cortical thickening. She was recommended to undergo mastectomy but because of a variety of reasons she decided to postpone her treatment. She has not the ones being seen by surgery recently. She did however except antiestrogen therapy. She has been on oral tamoxifen since January 2017. She does when she ran out of tamoxifen has not been taking for the past 3 weeks. She reports of the right breast is not treat change significantly from before.  REVIEW OF SYSTEMS:   Constitutional: Denies fevers, chills or  abnormal weight loss Eyes: Denies blurriness of vision Ears, nose, mouth, throat, and face: Denies mucositis or sore throat Respiratory: Denies cough, dyspnea or wheezes Cardiovascular: Denies palpitation, chest discomfort Gastrointestinal:  Denies nausea, heartburn or change in bowel habits Skin: Denies abnormal skin rashes Lymphatics: Denies new lymphadenopathy or easy bruising Neurological:Denies numbness, tingling or new weaknesses Behavioral/Psych: Mood is stable, no new changes  Extremities: No lower extremity edema Breast: Nipple inversion and stable findings in the breast All other systems were reviewed with the patient and are negative.  I have reviewed the past medical history, past surgical history, social history and family history with the patient and they are unchanged from previous note.  ALLERGIES:  is allergic to codeine; hydrocodone-acetaminophen; meloxicam; naproxen; propoxyphene n-acetaminophen; sulfonamide derivatives; and tramadol.  MEDICATIONS:  Current Outpatient Prescriptions  Medication Sig Dispense Refill  . ALPRAZolam (XANAX) 0.5 MG tablet TAKE ONE (1) TABLET BY MOUTH DAILY AS NEEDED FOR AXNIETY 30 tablet 0  . Blood Glucose Monitoring Suppl (ONE TOUCH ULTRA 2) w/Device KIT 1 Device by Does not apply route once. 1 each 0  . diclofenac (VOLTAREN) 75 MG EC tablet Take 75 mg by mouth 2 (two) times daily.    Marland Kitchen glucose blood (ONE TOUCH TEST STRIPS) test strip 1 each by Other route 3 (three) times daily. 100 each 12  . Lancets (ONETOUCH ULTRASOFT) lancets 1 each by Other route 3 (three) times daily. 100 each 12  . metFORMIN (GLUCOPHAGE) 500 MG tablet Take 1 tablet (500 mg total) by mouth 2 (two) times daily with a meal. 60 tablet 2  . oxyCODONE (ROXICODONE) 5 MG immediate release tablet  Take 1 tablet (5 mg total) by mouth 2 (two) times daily as needed for severe pain. 10 tablet 0  . tamoxifen (NOLVADEX) 20 MG tablet Take 1 tablet (20 mg total) by mouth daily. 90  tablet 3   No current facility-administered medications for this visit.    PHYSICAL EXAMINATION: ECOG PERFORMANCE STATUS: 1 - Symptomatic but completely ambulatory  Filed Vitals:   08/11/15 0932  BP: 125/95  Pulse: 86  Temp: 98.6 F (37 C)  Resp: 18   Filed Weights   08/11/15 0932  Weight: 140 lb (63.504 kg)    GENERAL:alert, no distress and comfortable SKIN: skin color, texture, turgor are normal, no rashes or significant lesions EYES: normal, Conjunctiva are pink and non-injected, sclera clear OROPHARYNX:no exudate, no erythema and lips, buccal mucosa, and tongue normal  NECK: supple, thyroid normal size, non-tender, without nodularity LYMPH:  no palpable lymphadenopathy in the cervical, axillary or inguinal LUNGS: clear to auscultation and percussion with normal breathing effort HEART: regular rate & rhythm and no murmurs and no lower extremity edema ABDOMEN:abdomen soft, non-tender and normal bowel sounds MUSCULOSKELETAL:no cyanosis of digits and no clubbing  NEURO: alert & oriented x 3 with fluent speech, no focal motor/sensory deficits EXTREMITIES: No lower extremity edema BREAST:Right breast nipple inversion, palpable subareolar nodule. (exam performed in the presence of a chaperone)  LABORATORY DATA:  I have reviewed the data as listed   Chemistry      Component Value Date/Time   NA 140 02/11/2015 0900   K 4.3 02/11/2015 0900   CL 104 02/11/2015 0900   CO2 28 02/11/2015 0900   BUN 13 02/11/2015 0900   CREATININE 0.72 02/11/2015 0900      Component Value Date/Time   CALCIUM 9.7 02/11/2015 0900   ALKPHOS 110 02/11/2015 0900   AST 32 02/11/2015 0900   ALT 49* 02/11/2015 0900   BILITOT 0.5 02/11/2015 0900       Lab Results  Component Value Date   WBC 6.9 02/11/2015   HGB 14.6 02/11/2015   HCT 43.7 02/11/2015   MCV 93.1 02/11/2015   PLT 367.0 02/11/2015   NEUTROABS 6.4 07/14/2010     ASSESSMENT & PLAN:  Breast cancer of lower-inner quadrant of  right female breast ([place]) Right breast irregular mass lower inner quadrant retroareolar 02/19/2015: 2.2 x 1.3 x 1.7 cm with architectural distortion and skin/nipple retraction, T2 N0 stage II a clinical stage, additional benign cysts largest 1.2 cm Right breast biopsy 02/20/2015 5:00: Invasive ductal carcinoma grade 2, perineural invasion present, ER 90%, PR 70%, HER-2 negative ratio 0.95, Ki-67 15%   Breast MRI 03/07/2015: Subareolar mass 2.2 x 2.4 x 2.5 cm extending into the retracted nipple, extending inferiorly and laterally over a distance of 4 cm, several level I right axillary lymph nodes with cortical thickening up to 7 mm with a maximum diameter of 15 mm --------------------------------------------------------------------- Current treatment: Neoadjuvant antiestrogen therapy with tamoxifen 20 mg daily (because the patient did not want to undergo surgery immediately for multiple family reasons)  Recommendations: staging CT scans and bone scan (not done by patient pref) Patient reports that in about 3 months she'll be ready for surgery because her son will turn 57 and that she does not have to worry too much about him. Her older son has been given her extensive problems. Patient tells me that he is very jealous of his younger brother. She believes that this is primarily because of head trauma. The older brother currently lives with his wife  and 3 children. Social services and case managers have been involved in their care. Child protective services have also been informed by her younger son regarding his brother.  I would like her to get mammogram and ultrasound the right breast for further evaluation. If there is dramatic progression, then I would not recommend waiting any further. If however it remained stable, then we can wait the next 3 months until her personal life stabilizes so that she can undergo the surgery.  1. Mastectomy followed by  2. Oncotype DX testing  3. Adjuvant radiation  therapy (if lymph nodes were involved) followed by 4. Adjuvant antiestrogen therapy  No orders of the defined types were placed in this encounter.   The patient has a good understanding of the overall plan. she agrees with it. she will call with any problems that may develop before the next visit here.   Rulon Eisenmenger, MD 08/11/2015

## 2015-08-11 NOTE — Telephone Encounter (Signed)
Left message to give patient appts for mammo, U/S, mri bx.

## 2015-08-11 NOTE — Telephone Encounter (Signed)
appt made and avs printed °

## 2015-08-11 NOTE — Assessment & Plan Note (Signed)
Right breast irregular mass lower inner quadrant retroareolar 02/19/2015: 2.2 x 1.3 x 1.7 cm with architectural distortion and skin/nipple retraction, T2 N0 stage II a clinical stage, additional benign cysts largest 1.2 cm Right breast biopsy 02/20/2015 5:00: Invasive ductal carcinoma grade 2, perineural invasion present, ER 90%, PR 70%, HER-2 negative ratio 0.95, Ki-67 15%   Breast MRI 03/07/2015: Subareolar mass 2.2 x 2.4 x 2.5 cm extending into the retracted nipple, extending inferiorly and laterally over a distance of 4 cm, several level I right axillary lymph nodes with cortical thickening up to 7 mm with a maximum diameter of 15 mm --------------------------------------------------------------------- Current treatment: Neoadjuvant antiestrogen therapy with tamoxifen 20 mg daily (because the patient did not want to undergo surgery immediately for multiple family reasons)  Recommendations: staging CT scans and bone scan  1. Mastectomy followed by 2. Oncotype DX testing to determine if chemotherapy would be of any benefit followed by 3. Adjuvant radiation therapy (if lymph nodes were involved) followed by 4. Adjuvant antiestrogen therapy

## 2015-08-12 ENCOUNTER — Ambulatory Visit: Payer: Medicaid Other | Admitting: Internal Medicine

## 2015-08-12 ENCOUNTER — Encounter: Payer: Self-pay | Admitting: *Deleted

## 2015-08-12 NOTE — Progress Notes (Signed)
[place] Work  Clinical Social Work was referred by patient navigator for assessment of psychosocial needs.  Clinical Social Worker met with patient in [place] office at [facility] to offer support and assess for needs.  Patient reported multiple stressors involving her family and the recent death of her father.  CSW provide patient with a space to express her thoughts, feelings, and concerns.  CSW provided supportive listening and validated patients feelings and concerns.  CSW and patient also discussed the importance of self-care and the importance of her emotional and physical health.  Patient was appreciative of CSW support.  CSW encouraged patient to call with needs or concerns.        Johnnye Lana, MSW, LCSW, OSW-C Clinical Social Worker [facility] [phone]

## 2015-08-13 ENCOUNTER — Other Ambulatory Visit: Payer: Self-pay | Admitting: Hematology and Oncology

## 2015-08-15 ENCOUNTER — Other Ambulatory Visit: Payer: Medicaid Other

## 2015-08-19 ENCOUNTER — Other Ambulatory Visit: Payer: Medicaid Other

## 2015-08-20 ENCOUNTER — Other Ambulatory Visit: Payer: Medicaid Other

## 2015-08-22 ENCOUNTER — Ambulatory Visit (HOSPITAL_COMMUNITY): Payer: Medicaid Other

## 2015-08-22 ENCOUNTER — Encounter (HOSPITAL_COMMUNITY): Payer: Medicaid Other

## 2015-09-10 ENCOUNTER — Other Ambulatory Visit: Payer: Medicaid Other

## 2015-09-11 ENCOUNTER — Other Ambulatory Visit: Payer: Medicaid Other

## 2015-09-12 ENCOUNTER — Telehealth: Payer: Self-pay | Admitting: *Deleted

## 2015-09-12 NOTE — Telephone Encounter (Signed)
Spoke with patient about her cancelling her appointments twice for bx and imaging.  She has to go to court on 7/31 and she states she cannot afford to be off work right now to go to these appointments. She is still dealing with her father's estate and her son's school she has to pay for.  She refused to reschedule these appointments at this time.  Encouraged her to call when she feels she is able to get these done.  Informed her I would call back in August to check in with her.  She is aware of her appointment with Dr. Lindi Adie 9/19.

## 2015-09-18 ENCOUNTER — Encounter (HOSPITAL_COMMUNITY): Payer: Medicaid Other

## 2015-09-18 ENCOUNTER — Ambulatory Visit (HOSPITAL_COMMUNITY): Payer: Medicaid Other

## 2015-11-04 ENCOUNTER — Telehealth: Payer: Self-pay

## 2015-11-04 ENCOUNTER — Ambulatory Visit (INDEPENDENT_AMBULATORY_CARE_PROVIDER_SITE_OTHER): Payer: Medicaid Other | Admitting: Internal Medicine

## 2015-11-04 ENCOUNTER — Encounter: Payer: Self-pay | Admitting: Internal Medicine

## 2015-11-04 VITALS — BP 104/74 | HR 71 | Temp 98.7°F | Wt 147.0 lb

## 2015-11-04 DIAGNOSIS — E0590 Thyrotoxicosis, unspecified without thyrotoxic crisis or storm: Secondary | ICD-10-CM

## 2015-11-04 DIAGNOSIS — E785 Hyperlipidemia, unspecified: Secondary | ICD-10-CM

## 2015-11-04 DIAGNOSIS — E039 Hypothyroidism, unspecified: Secondary | ICD-10-CM | POA: Diagnosis not present

## 2015-11-04 DIAGNOSIS — E119 Type 2 diabetes mellitus without complications: Secondary | ICD-10-CM

## 2015-11-04 DIAGNOSIS — C50311 Malignant neoplasm of lower-inner quadrant of right female breast: Secondary | ICD-10-CM

## 2015-11-04 DIAGNOSIS — F411 Generalized anxiety disorder: Secondary | ICD-10-CM

## 2015-11-04 LAB — COMPREHENSIVE METABOLIC PANEL
ALT: 20 U/L (ref 0–35)
AST: 15 U/L (ref 0–37)
Albumin: 4.2 g/dL (ref 3.5–5.2)
Alkaline Phosphatase: 94 U/L (ref 39–117)
BUN: 17 mg/dL (ref 6–23)
CO2: 28 mEq/L (ref 19–32)
Calcium: 9.4 mg/dL (ref 8.4–10.5)
Chloride: 105 mEq/L (ref 96–112)
Creatinine, Ser: 0.72 mg/dL (ref 0.40–1.20)
GFR: 91.24 mL/min (ref >60.00)
Glucose, Bld: 134 mg/dL — ABNORMAL HIGH (ref 70–99)
Potassium: 4.5 mEq/L (ref 3.5–5.1)
Sodium: 138 mEq/L (ref 135–145)
Total Bilirubin: 0.7 mg/dL (ref 0.2–1.2)
Total Protein: 7.2 g/dL (ref 6.0–8.3)

## 2015-11-04 LAB — CBC
HCT: 43.7 % (ref 36.0–46.0)
Hemoglobin: 15 g/dL (ref 12.0–15.0)
MCHC: 34.3 g/dL (ref 30.0–36.0)
MCV: 92.7 fl (ref 78.0–100.0)
Platelets: 382 10*3/uL (ref 150.0–400.0)
RBC: 4.71 Mil/uL (ref 3.87–5.11)
RDW: 12.5 % (ref 11.5–15.5)
WBC: 8.2 10*3/uL (ref 4.0–10.5)

## 2015-11-04 LAB — LIPID PANEL
Cholesterol: 299 mg/dL — ABNORMAL HIGH (ref 0–200)
HDL: 49.4 mg/dL (ref >39.00)
NonHDL: 250.04
Total CHOL/HDL Ratio: 6
Triglycerides: 263 mg/dL — ABNORMAL HIGH (ref 0.0–149.0)
VLDL: 52.6 mg/dL — ABNORMAL HIGH (ref 0.0–40.0)

## 2015-11-04 LAB — LDL CHOLESTEROL, DIRECT: Direct LDL: 210 mg/dL

## 2015-11-04 LAB — T4, FREE: Free T4: 0.69 ng/dL (ref 0.60–1.60)

## 2015-11-04 LAB — HEMOGLOBIN A1C: Hgb A1c MFr Bld: 6.6 % — ABNORMAL HIGH (ref 4.6–6.5)

## 2015-11-04 LAB — TSH: TSH: 1.3 u[IU]/mL (ref 0.35–4.50)

## 2015-11-04 MED ORDER — ALPRAZOLAM 0.5 MG PO TABS: ORAL_TABLET | ORAL | 0 refills | Status: DC

## 2015-11-04 NOTE — Telephone Encounter (Signed)
-----   Message from Jearld Fenton, NP sent at 11/04/2015 11:25 AM EDT ----- Please phone in Xanax

## 2015-11-04 NOTE — Assessment & Plan Note (Signed)
Will check TSH and T4 today.   

## 2015-11-04 NOTE — Assessment & Plan Note (Signed)
Continue Tamoxifen She will continue to follow with Dr. Sonny Dandy

## 2015-11-04 NOTE — Progress Notes (Signed)
Subjective:    Patient ID: Emily Wilson, female    DOB: 1966-01-19, 50 y.o.   MRN: [id number]  HPI  Pt presents to the clinic today to follow up chronic conditions.  DM 2: Her last A1C was 6.6%, 05/2015. She is taking Metformin 500 mg BID. She denies GI side effects. Her fasting sugars are less than 120. She occasionally takes her blood sugar when she is not feeling well, and reports it is usually in the 140-150 range a few hours after eating. She does have some numbness in her feet, but attributes this to standing for long periods of time. She does not take flu shots. Pneumovax 2014. Her last eye exam was 1 week ago.  HLD: Her last LDL was 139, 05/2015. She declined starting on cholesterol lowering medication, but instead wanted to try lifestyle modification. She is consuming a low fat diet.  Hyperthyroidism: She is not taking anything for this. She denies tachycardia, weight loss, loose stools, etc.  Anxiety: Triggered by stress. She takes Xanax about 1- times per month. She reports she is out and would like a refill today.   Right breast cancer: She has not taken any treamtnet for this for financial reasons. She is following with Dr. Sonny Dandy. She is taking Tamoxifen as prescribed.  Review of Systems      Past Medical History:  Diagnosis Date  . Anxiety state, unspecified   . Heart murmur   . Hyperlipidemia   . Hyperthyroidism   . Smoker   . Thyrotoxicosis without mention of goiter or other cause, without mention of thyrotoxic crisis or storm     Current Outpatient Prescriptions  Medication Sig Dispense Refill  . ALPRAZolam (XANAX) 0.5 MG tablet TAKE ONE (1) TABLET BY MOUTH DAILY AS NEEDED FOR AXNIETY 30 tablet 0  . Blood Glucose Monitoring Suppl (ONE TOUCH ULTRA 2) w/Device KIT 1 Device by Does not apply route once. 1 each 0  . diclofenac (VOLTAREN) 75 MG EC tablet Take 75 mg by mouth 2 (two) times daily.    Marland Kitchen glucose blood (ONE TOUCH TEST STRIPS) test strip 1 each by  Other route 3 (three) times daily. 100 each 12  . Lancets (ONETOUCH ULTRASOFT) lancets 1 each by Other route 3 (three) times daily. 100 each 12  . metFORMIN (GLUCOPHAGE) 500 MG tablet Take 1 tablet (500 mg total) by mouth 2 (two) times daily with a meal. 60 tablet 2  . oxyCODONE (ROXICODONE) 5 MG immediate release tablet Take 1 tablet (5 mg total) by mouth 2 (two) times daily as needed for severe pain. 10 tablet 0  . tamoxifen (NOLVADEX) 20 MG tablet Take 1 tablet (20 mg total) by mouth daily. 90 tablet 3   No current facility-administered medications for this visit.     Allergies  Allergen Reactions  . Codeine     hives  . Hydrocodone-Acetaminophen     hives  . Meloxicam Other (See Comments)    Abdominal pain   . Naproxen     Cant breath  . Propoxyphene N-Acetaminophen     hives  . Sulfonamide Derivatives     Hives   . Tramadol Other (See Comments)    Abdominal Pain    Family History  Problem Relation Age of Onset  . Diabetes Mother   . Bladder Cancer Mother 67    smoker  . Other Mother 36    TAH for unspecified reason  . Multiple sclerosis Mother   . Cancer Father 9  lymphatic/tonsil cancer - in remission; former smoker  . COPD Maternal Grandmother     smoker  . Emphysema Maternal Grandmother     smoker  . Stroke Maternal Grandfather   . Dementia Maternal Uncle   . Diabetes Paternal Grandmother   . COPD Maternal Uncle     smoker  . Multiple sclerosis Paternal Aunt   . Breast cancer Paternal Aunt     dx. late 67s - early 35s    Social History   Social History  . Marital status: Divorced    Spouse name: N/A  . Number of children: N/A  . Years of education: N/A   Occupational History  . Cashier    Social History Main Topics  . Smoking status: Former Smoker    Packs/day: 3.00    Years: 30.00    Types: Cigarettes    Quit date: 10/24/2011  . Smokeless tobacco: Never Used  . Alcohol use No  . Drug use:   . Sexual activity: Not Currently   Other  Topics Concern  . Not on file   Social History Narrative   In [place] w/boyfriend   Works [employer], Scientist, water quality     Constitutional: Denies fever, malaise, fatigue, headache or abrupt weight changes.  HEENT: Denies eye pain, eye redness, ear pain, ringing in the ears, wax buildup, runny nose, nasal congestion, bloody nose, or sore throat. Respiratory: Denies difficulty breathing, shortness of breath, cough or sputum production.   Cardiovascular: Denies chest pain, chest tightness, palpitations or swelling in the hands or feet.  Gastrointestinal: Denies abdominal pain, bloating, constipation, diarrhea or blood in the stool.  GU: Denies urgency, frequency, pain with urination, burning sensation, blood in urine, odor or discharge. Musculoskeletal: Denies decrease in range of motion, difficulty with gait, muscle pain or joint pain and swelling.  Skin: Denies redness, rashes, lesions or ulcercations.  Neurological: Denies dizziness, difficulty with memory, difficulty with speech or problems with balance and coordination.  Psych: Pt has history of anxiety. Denies depression, SI/HI.  No other specific complaints in a complete review of systems (except as listed in HPI above).  Objective:   Physical Exam  BP 104/74   Pulse 71   Temp 98.7 F (37.1 C) (Oral)   Wt 147 lb (66.7 kg)   SpO2 97%   BMI 29.69 kg/m  Wt Readings from Last 3 Encounters:  11/04/15 147 lb (66.7 kg)  08/11/15 140 lb (63.5 kg)  06/20/15 148 lb 8 oz (67.4 kg)    General: Appears her stated age, well developed, well nourished in NAD. Neck:  Neck supple, trachea midline. No masses, lumps or thyromegaly present.  Cardiovascular: Normal rate and rhythm. S1,S2 noted.  No murmur, rubs or gallops noted. No JVD or BLE edema. No carotid bruits noted. Pulmonary/Chest: Normal effort and positive vesicular breath sounds. No respiratory distress. No wheezes, rales or ronchi noted.  Neurological: Alert and oriented. Sensation intact to  BLE. Psychiatric: Mood and affect normal. Behavior is normal. Judgment and thought content normal.    BMET    Component Value Date/Time   NA 140 02/11/2015 0900   K 4.3 02/11/2015 0900   CL 104 02/11/2015 0900   CO2 28 02/11/2015 0900   GLUCOSE 138 (H) 02/11/2015 0900   BUN 13 02/11/2015 0900   CREATININE 0.72 02/11/2015 0900   CALCIUM 9.7 02/11/2015 0900   GFRNONAA >60 07/14/2010 1050   GFRAA  07/14/2010 1050    >60        The eGFR has  been calculated using the MDRD equation. This calculation has not been validated in all clinical situations. eGFR's persistently <60 mL/min signify possible Chronic Kidney Disease.    Lipid Panel     Component Value Date/Time   CHOL 239 (H) 06/17/2015 1015   TRIG 266.0 (H) 06/17/2015 1015   HDL 44.50 06/17/2015 1015   CHOLHDL 5 06/17/2015 1015   VLDL 53.2 (H) 06/17/2015 1015    CBC    Component Value Date/Time   WBC 6.9 02/11/2015 0900   RBC 4.69 02/11/2015 0900   HGB 14.6 02/11/2015 0900   HCT 43.7 02/11/2015 0900   PLT 367.0 02/11/2015 0900   MCV 93.1 02/11/2015 0900   MCH 32.3 07/14/2010 1050   MCHC 33.4 02/11/2015 0900   RDW 12.8 02/11/2015 0900   LYMPHSABS 3.5 07/14/2010 1050   MONOABS 0.8 07/14/2010 1050   EOSABS 0.2 07/14/2010 1050   BASOSABS 0.1 07/14/2010 1050    Hgb A1C Lab Results  Component Value Date   HGBA1C 6.6 (H) 06/17/2015            Assessment & Plan:

## 2015-11-04 NOTE — Assessment & Plan Note (Signed)
Lipid panel and CMET today If LDL not at goal, will discuss starting statin therapy Encouraged her to consume a low fat diet

## 2015-11-04 NOTE — Assessment & Plan Note (Signed)
A1C and foot exam today Encouraged her to consume a low fat, low carb diet Eye exam UTD She declines flu shot today Pneumovax UTD Continue A1C unless directed otherwise

## 2015-11-04 NOTE — Telephone Encounter (Signed)
Xanax called into pharmacy.

## 2015-11-04 NOTE — Patient Instructions (Signed)

## 2015-11-04 NOTE — Assessment & Plan Note (Signed)
Support offered today Xanax refilled today

## 2015-11-07 ENCOUNTER — Other Ambulatory Visit: Payer: Self-pay | Admitting: Internal Medicine

## 2015-11-07 MED ORDER — SIMVASTATIN 20 MG PO TABS: 20.0000 mg | ORAL_TABLET | Freq: Every day | ORAL | 2 refills | Status: DC

## 2015-11-07 MED ORDER — SIMVASTATIN 20 MG PO TABS: 20.0000 mg | ORAL_TABLET | Freq: Every day | ORAL | 0 refills | Status: DC

## 2015-11-07 NOTE — Addendum Note (Signed)
Addended by: Lurlean Nanny on: 11/07/2015 01:47 PM   Modules accepted: Orders

## 2015-11-07 NOTE — Progress Notes (Signed)
Pre visit review using our clinic review tool, if applicable. No additional management support is needed unless otherwise documented below in the visit note. 

## 2015-11-11 ENCOUNTER — Ambulatory Visit (HOSPITAL_BASED_OUTPATIENT_CLINIC_OR_DEPARTMENT_OTHER): Payer: Medicaid Other | Admitting: Hematology and Oncology

## 2015-11-11 ENCOUNTER — Encounter: Payer: Self-pay | Admitting: *Deleted

## 2015-11-11 ENCOUNTER — Encounter: Payer: Self-pay | Admitting: Hematology and Oncology

## 2015-11-11 DIAGNOSIS — C50111 Malignant neoplasm of central portion of right female breast: Secondary | ICD-10-CM

## 2015-11-11 DIAGNOSIS — C50311 Malignant neoplasm of lower-inner quadrant of right female breast: Secondary | ICD-10-CM

## 2015-11-11 DIAGNOSIS — Z79810 Long term (current) use of selective estrogen receptor modulators (SERMs): Secondary | ICD-10-CM

## 2015-11-11 NOTE — Progress Notes (Signed)
Patient Care Team: Jearld Fenton, NP as PCP - General (Internal Medicine)  SUMMARY OF ONCOLOGIC HISTORY:   Breast cancer of lower-inner quadrant of right female breast ([place])   02/19/2015 Mammogram    Right breast irregular mass lower inner quadrant 2.2 x 1.3 x 1.7 cm with architectural distortion and skin/nipple retraction, T2 N0 stage II a clinical stage, additional benign cysts largest 1.2 cm      02/20/2015 Initial Diagnosis    Right breast biopsy 5:00: Invasive ductal carcinoma grade 2, perineural invasion present, ER 90%, PR 70%, HER-2 negative ratio 0.95, Ki-67 15%       03/07/2015 Breast MRI    right breast inferior subareolar mass measuring 2.2 x 2.4 x 2.5 cm with skin and nipple enhancement, extending inferiorly and laterally is intraductal enhancement over a distance of 4 cm, several level I right axillary lymph nodes with cortical thickening      03/13/2015 -  Anti-estrogen oral therapy    Neoadjuvant antiestrogen therapy with tamoxifen 20 mg daily (because the patient did not want to undergo surgery immediately for multiple family reasons)       CHIEF COMPLIANT: Follow-up on tamoxifen therapy neoadjuvant treatment  INTERVAL HISTORY: Emily Wilson is a 50 year old with above-mentioned history of right breast cancer who needs right breast surgery but she is currently on tamoxifen therapy because her current social situation does not allow her to have breast surgery at this time. She has had many issues related to her son who is dependent on her. She tells me that only after her son graduates and finds meaningful employment she will then plan to undergo breast surgery. She has been on tamoxifen since January 2017 and reports that she is tolerating it fairly well except for hot flashes. She reports that the lump in the right breast is exactly the same and she has not noticed any change in the size or the nature of the skin of her breast. She denies any pain or discomfort. She  is currently working in a part-time job and does not appear to be impaired from the breast cancer on tamoxifen therapy.  REVIEW OF SYSTEMS:   Constitutional: Denies fevers, chills or abnormal weight loss Eyes: Denies blurriness of vision Ears, nose, mouth, throat, and face: Denies mucositis or sore throat Respiratory: Denies cough, dyspnea or wheezes Cardiovascular: Denies palpitation, chest discomfort Gastrointestinal:  Denies nausea, heartburn or change in bowel habits Skin: Denies abnormal skin rashes Lymphatics: Denies new lymphadenopathy or easy bruising Neurological:Denies numbness, tingling or new weaknesses Behavioral/Psych: Mood is stable, no new changes  Extremities: No lower extremity edema Breast: Right breast lump  All other systems were reviewed with the patient and are negative.  I have reviewed the past medical history, past surgical history, social history and family history with the patient and they are unchanged from previous note.  ALLERGIES:  is allergic to codeine; hydrocodone-acetaminophen; meloxicam; naproxen; propoxyphene n-acetaminophen; sulfonamide derivatives; and tramadol.  MEDICATIONS:  Current Outpatient Prescriptions  Medication Sig Dispense Refill  . ALPRAZolam (XANAX) 0.5 MG tablet TAKE ONE (1) TABLET BY MOUTH DAILY AS NEEDED FOR AXNIETY 30 tablet 0  . Blood Glucose Monitoring Suppl (ONE TOUCH ULTRA 2) w/Device KIT 1 Device by Does not apply route once. 1 each 0  . glucose blood (ONE TOUCH TEST STRIPS) test strip 1 each by Other route 3 (three) times daily. 100 each 12  . Lancets (ONETOUCH ULTRASOFT) lancets 1 each by Other route 3 (three) times daily. 100 each  12  . metFORMIN (GLUCOPHAGE) 500 MG tablet Take 1 tablet (500 mg total) by mouth 2 (two) times daily with a meal. 60 tablet 2  . simvastatin (ZOCOR) 20 MG tablet Take 1 tablet (20 mg total) by mouth at bedtime. 90 tablet 0  . tamoxifen (NOLVADEX) 20 MG tablet Take 1 tablet (20 mg total) by mouth  daily. 90 tablet 3   No current facility-administered medications for this visit.     PHYSICAL EXAMINATION: ECOG PERFORMANCE STATUS: 1 - Symptomatic but completely ambulatory  Vitals:   11/11/15 0935  BP: 115/74  Pulse: 76  Resp: 18  Temp: 98.4 F (36.9 C)   Filed Weights   11/11/15 0935  Weight: 147 lb 9.6 oz (67 kg)    GENERAL:alert, no distress and comfortable SKIN: skin color, texture, turgor are normal, no rashes or significant lesions EYES: normal, Conjunctiva are pink and non-injected, sclera clear OROPHARYNX:no exudate, no erythema and lips, buccal mucosa, and tongue normal  NECK: supple, thyroid normal size, non-tender, without nodularity LYMPH:  no palpable lymphadenopathy in the cervical, axillary or inguinal LUNGS: clear to auscultation and percussion with normal breathing effort HEART: regular rate & rhythm and no murmurs and no lower extremity edema ABDOMEN:abdomen soft, non-tender and normal bowel sounds MUSCULOSKELETAL:no cyanosis of digits and no clubbing  NEURO: alert & oriented x 3 with fluent speech, no focal motor/sensory deficits EXTREMITIES: No lower extremity edema BREAST:2 x 2 centimeter right breast lump with nipple inversion. No palpable axillary supraclavicular or infraclavicular adenopathy no breast tenderness or nipple discharge. (exam performed in the presence of a chaperone)  LABORATORY DATA:  I have reviewed the data as listed   Chemistry      Component Value Date/Time   NA 138 11/04/2015 1045   K 4.5 11/04/2015 1045   CL 105 11/04/2015 1045   CO2 28 11/04/2015 1045   BUN 17 11/04/2015 1045   CREATININE 0.72 11/04/2015 1045      Component Value Date/Time   CALCIUM 9.4 11/04/2015 1045   ALKPHOS 94 11/04/2015 1045   AST 15 11/04/2015 1045   ALT 20 11/04/2015 1045   BILITOT 0.7 11/04/2015 1045       Lab Results  Component Value Date   WBC 8.2 11/04/2015   HGB 15.0 11/04/2015   HCT 43.7 11/04/2015   MCV 92.7 11/04/2015   PLT  382.0 11/04/2015   NEUTROABS 6.4 07/14/2010     ASSESSMENT & PLAN:  Breast cancer of lower-inner quadrant of right female breast ([place]) Right breast irregular mass lower inner quadrant retroareolar 02/19/2015: 2.2 x 1.3 x 1.7 cm with architectural distortion and skin/nipple retraction, T2 N0 stage II a clinical stage, additional benign cysts largest 1.2 cm Right breast biopsy 02/20/2015 5:00: Invasive ductal carcinoma grade 2, perineural invasion present, ER 90%, PR 70%, HER-2 negative ratio 0.95, Ki-67 15%   Breast MRI 03/07/2015: Subareolar mass 2.2 x 2.4 x 2.5 cm extending into the retracted nipple, extending inferiorly and laterally over a distance of 4 cm, several level I right axillary lymph nodes with cortical thickening up to 7 mm with a maximum diameter of 15 mm --------------------------------------------------------------------- Current treatment: Neoadjuvant antiestrogen therapy with tamoxifen 20 mg daily (because the patient did not want to undergo surgery immediately for multiple family reasons)  Plan: 1. Restaging of the breasts: Patient did not undergo the mammogram and ultrasound because of financial reasons. We had our case manager and social worker talked with the patient. She still does not want to undergo  these testing until next year. 2. Mastectomy followed by  3. Oncotype DX testing  4. Adjuvant radiation therapy (if lymph nodes were involved) followed by 5. Adjuvant antiestrogen therapy  Return to clinic in 6 months for follow-up on tamoxifen therapy.  No orders of the defined types were placed in this encounter.  The patient has a good understanding of the overall plan. she agrees with it. she will call with any problems that may develop before the next visit here.   Rulon Eisenmenger, MD 11/11/15

## 2015-11-11 NOTE — Assessment & Plan Note (Signed)
Right breast irregular mass lower inner quadrant retroareolar 02/19/2015: 2.2 x 1.3 x 1.7 cm with architectural distortion and skin/nipple retraction, T2 N0 stage II a clinical stage, additional benign cysts largest 1.2 cm Right breast biopsy 02/20/2015 5:00: Invasive ductal carcinoma grade 2, perineural invasion present, ER 90%, PR 70%, HER-2 negative ratio 0.95, Ki-67 15%   Breast MRI 03/07/2015: Subareolar mass 2.2 x 2.4 x 2.5 cm extending into the retracted nipple, extending inferiorly and laterally over a distance of 4 cm, several level I right axillary lymph nodes with cortical thickening up to 7 mm with a maximum diameter of 15 mm --------------------------------------------------------------------- Current treatment: Neoadjuvant antiestrogen therapy with tamoxifen 20 mg daily (because the patient did not want to undergo surgery immediately for multiple family reasons)  Plan: 1. Restaging of the breasts 2. Mastectomy followed by  3. Oncotype DX testing  4. Adjuvant radiation therapy (if lymph nodes were involved) followed by 5. Adjuvant antiestrogen therapy

## 2015-11-13 NOTE — Progress Notes (Addendum)
[place] Work  Clinical Social Work was referred by Tourist information centre manager for psychosocial support.  CSW met with patient in exam room.  Patient shared she's chosen to wait to receive cancer treatment due to her lack of insurance and current family situation.  CSW explored factors influencing decision not to move forward with treatment.  Patient understands risks of waiting and verbalized that it is the best option for her at this time.  CSW encouraged patient to call for emotional support or resources as needed.  Polo Riley, MSW, LCSW, OSW-C Clinical Social Worker [facility] [phone]

## 2015-12-02 ENCOUNTER — Ambulatory Visit: Payer: Medicaid Other | Admitting: Internal Medicine

## 2016-05-05 ENCOUNTER — Encounter (HOSPITAL_COMMUNITY): Payer: Self-pay

## 2016-05-10 ENCOUNTER — Ambulatory Visit: Payer: Medicaid Other | Admitting: Hematology and Oncology

## 2016-07-20 ENCOUNTER — Ambulatory Visit (INDEPENDENT_AMBULATORY_CARE_PROVIDER_SITE_OTHER): Payer: Self-pay | Admitting: Family Medicine

## 2016-07-20 ENCOUNTER — Encounter: Payer: Self-pay | Admitting: Family Medicine

## 2016-07-20 VITALS — BP 118/72 | HR 104 | Temp 98.4°F | Ht 59.0 in | Wt 145.0 lb

## 2016-07-20 DIAGNOSIS — R42 Dizziness and giddiness: Secondary | ICD-10-CM | POA: Insufficient documentation

## 2016-07-20 DIAGNOSIS — J0100 Acute maxillary sinusitis, unspecified: Secondary | ICD-10-CM

## 2016-07-20 DIAGNOSIS — J019 Acute sinusitis, unspecified: Secondary | ICD-10-CM | POA: Insufficient documentation

## 2016-07-20 MED ORDER — AMOXICILLIN 500 MG PO CAPS: 500.0000 mg | ORAL_CAPSULE | Freq: Three times a day (TID) | ORAL | 0 refills | Status: DC

## 2016-07-20 NOTE — Progress Notes (Signed)
Subjective:    Patient ID: Emily Wilson, female    DOB: [birth year], 51 y.o.   MRN: [id number]  HPI Here with dizziness and ear pain for 3-4 days with congestion and scratchy throat   Woke up with a ST on Thursday -r side  Cold symptoms  Gets tonsil stones and that hurts  She has had inner ear problems in the past - and they act up   A little sinus pressure  Cannot blow anything out of her nose- more pnd   No cough   Has had strep a few times   Does not take anything for the vertigo  Has been around a virus (son had something)  Patient Active Problem List   Diagnosis Date Noted  . Acute sinusitis 07/20/2016  . Vertigo 07/20/2016  . Family history of breast cancer in female 03/13/2015  . Breast cancer of lower-inner quadrant of right female breast ([place]) 02/25/2015  . DM type Wilson (diabetes mellitus, type Wilson) ([place]) 02/25/2015  . HLD (hyperlipidemia) 02/25/2015  . Overweight (BMI 25.0-29.9) 02/07/2014  . Anxiety state 05/26/2009  . Hyperthyroidism 11/14/2006  . MITRAL VALVE PROLAPSE 11/14/2006   Past Medical History:  Diagnosis Date  . Anxiety state, unspecified   . Heart murmur   . Hyperlipidemia   . Hyperthyroidism   . Smoker   . 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 ABDOMINAL HYSTERECTOMY    . APPENDECTOMY    . TUBAL LIGATION     Social History  Substance Use Topics  . Smoking status: Former Smoker    Packs/day: 3.00    Years: 30.00    Types: Cigarettes    Quit date: 10/24/2011  . Smokeless tobacco: Never Used  . Alcohol use No   Family History  Problem Relation Age of Onset  . Diabetes Mother   . Bladder Cancer Mother 15       smoker  . Other Mother 61       TAH for unspecified reason  . Multiple sclerosis Mother   . Cancer Father 64       lymphatic/tonsil cancer - in remission; former smoker  . COPD Maternal Grandmother      smoker  . Emphysema Maternal Grandmother        smoker  . Stroke Maternal Grandfather   . Dementia Maternal Uncle   . Diabetes Paternal Grandmother   . COPD Maternal Uncle        smoker  . Multiple sclerosis Paternal Aunt   . Breast cancer Paternal Aunt        dx. late 19s - early 73s   Allergies  Allergen Reactions  . Codeine     hives  . Hydrocodone-Acetaminophen     hives  . Meloxicam Other (See Comments)    Abdominal pain   . Naproxen     Cant breath  . Propoxyphene N-Acetaminophen     hives  . Sulfonamide Derivatives     Hives   . Tramadol Other (See Comments)    Abdominal Pain   Current Outpatient Prescriptions on File Prior to Visit  Medication Sig Dispense Refill  . ALPRAZolam (XANAX) 0.5 MG tablet TAKE Emily (1) TABLET BY MOUTH DAILY AS NEEDED FOR AXNIETY 30 tablet 0  . Blood Glucose Monitoring Suppl (Emily Wilson) w/Device KIT 1 Device by Does not apply route once. 1 each 0  .  glucose blood (Emily Wilson) test strip 1 each by Other route 3 (three) times daily. 100 each 12  . Lancets (Emily Wilson) lancets 1 each by Other route 3 (three) times daily. 100 each 12  . metFORMIN (GLUCOPHAGE) 500 MG tablet Take 1 tablet (500 mg total) by mouth Wilson (two) times daily with a meal. 60 tablet Wilson  . simvastatin (ZOCOR) 20 MG tablet Take 1 tablet (20 mg total) by mouth at bedtime. 90 tablet 0  . tamoxifen (NOLVADEX) 20 MG tablet Take 1 tablet (20 mg total) by mouth daily. 90 tablet 3   No current facility-administered medications on file prior to visit.      Review of Systems  Constitutional: Positive for appetite change and fatigue. Negative for fever.  HENT: Positive for congestion, ear pain, postnasal drip, rhinorrhea, sinus pain, sinus pressure, sneezing and sore throat. Negative for ear discharge and facial swelling.   Eyes: Negative for pain and discharge.  Respiratory: Negative for cough, shortness of breath, wheezing and stridor.     Cardiovascular: Negative for chest pain.  Gastrointestinal: Negative for diarrhea, nausea and vomiting.  Genitourinary: Negative for frequency, hematuria and urgency.  Musculoskeletal: Negative for arthralgias and myalgias.  Skin: Negative for rash.  Neurological: Positive for headaches. Negative for dizziness, weakness and light-headedness.       Pos for positional dizziness  Psychiatric/Behavioral: Negative for confusion and dysphoric mood.       Objective:   Physical Exam  Constitutional: She appears well-developed and well-nourished. No distress.  HENT:  Head: Normocephalic and atraumatic.  Right Ear: External ear normal.  Left Ear: External ear normal.  Mouth/Throat: Oropharynx is clear and moist. No oropharyngeal exudate.  Nares are injected and congested  R maxillary sinus tenderness TMs are dull Clear rhinorrhea and post nasal drip   Eyes: Conjunctivae and EOM are normal. Pupils are equal, round, and reactive to light. Right eye exhibits no discharge. Left eye exhibits no discharge.  Mild horizontal nystagmus bilaterally  Neck: Normal range of motion. Neck supple.  Cardiovascular: Normal rate, regular rhythm and normal heart sounds.   Pulmonary/Chest: Effort normal and breath sounds normal. No respiratory distress. She has no wheezes. She has no rales. She exhibits no tenderness.  Lymphadenopathy:    She has no cervical adenopathy.  Neurological: She is alert. She has normal reflexes. She displays no atrophy. No cranial nerve deficit or sensory deficit. She exhibits normal muscle tone. Coordination and gait normal.  Skin: Skin is warm and dry. No rash noted. No erythema. No pallor.  Psychiatric: She has a normal mood and affect.          Assessment & Plan:   Problem List Items Addressed This Visit      Respiratory   Acute sinusitis    R maxillary with ear symptoms also (and some vertigo) tx with amoxicillin  Disc symptomatic care - see instructions on AVS   Update if not starting to improve in a week or if worsening         Relevant Medications   amoxicillin (AMOXIL) 500 MG capsule     Other   Vertigo    Suspect due to ear fullness from sinusitis Pt has hx of vertigo as well Offered meclizine-she declined  Can use dramamine otc if needed as well Update if not starting to improve in a week or if worsening

## 2016-07-20 NOTE — Progress Notes (Signed)
Pre visit review using our clinic review tool, if applicable. No additional management support is needed unless otherwise documented below in the visit note. 

## 2016-07-20 NOTE — Patient Instructions (Signed)
Drink fluids and rest  Change position slowly and alert [place] if dizziness gets worse  Continue saline Also gargle with water or salt water  Take the amoxicillin for sinus infection    Update if not starting to improve in a week or if worsening

## 2016-07-21 ENCOUNTER — Ambulatory Visit: Payer: Medicaid Other | Admitting: Internal Medicine

## 2016-07-21 NOTE — Assessment & Plan Note (Signed)
R maxillary with ear symptoms also (and some vertigo) tx with amoxicillin  Disc symptomatic care - see instructions on AVS  Update if not starting to improve in a week or if worsening

## 2016-07-21 NOTE — Assessment & Plan Note (Signed)
Suspect due to ear fullness from sinusitis Pt has hx of vertigo as well Offered meclizine-she declined  Can use dramamine otc if needed as well Update if not starting to improve in a week or if worsening

## 2016-07-28 ENCOUNTER — Telehealth: Payer: Self-pay

## 2016-07-28 MED ORDER — AMOXICILLIN 500 MG PO CAPS: 500.0000 mg | ORAL_CAPSULE | Freq: Three times a day (TID) | ORAL | 0 refills | Status: DC

## 2016-07-28 NOTE — Telephone Encounter (Signed)
Pt left v/m;pt seen 07/20/16 with sinus infection; when pt finished the abx her symptoms returned; pt feels like blocked sinus and pus coming out of adenoid and rt earache like when seen 07/20/16; no fever. and pt wants to know what to do next. Pt request cb. [place].

## 2016-07-28 NOTE — Telephone Encounter (Signed)
Please refill her course of amoxicillin-she may need a longer course  Flush sinuses with saline if possible  Please let us know if not resolved after finishing or earlier if symptoms worsen

## 2016-07-28 NOTE — Telephone Encounter (Signed)
Pt notified of Dr. Marliss Coots comments/instructions and verbalized understanding, Rx sent to pharmacy

## 2017-05-01 ENCOUNTER — Telehealth (HOSPITAL_COMMUNITY): Payer: Self-pay | Admitting: Emergency Medicine

## 2017-05-01 ENCOUNTER — Ambulatory Visit (HOSPITAL_COMMUNITY)
Admission: EM | Admit: 2017-05-01 | Discharge: 2017-05-01 | Disposition: A | Discharge status: Home / Self Care | Payer: Self-pay | Attending: Physician Assistant | Admitting: Physician Assistant

## 2017-05-01 ENCOUNTER — Encounter (HOSPITAL_COMMUNITY): Payer: Self-pay | Admitting: Emergency Medicine

## 2017-05-01 DIAGNOSIS — H938X1 Other specified disorders of right ear: Secondary | ICD-10-CM

## 2017-05-01 DIAGNOSIS — H9201 Otalgia, right ear: Secondary | ICD-10-CM

## 2017-05-01 DIAGNOSIS — H9191 Unspecified hearing loss, right ear: Secondary | ICD-10-CM

## 2017-05-01 MED ORDER — AZITHROMYCIN 250 MG PO TABS: ORAL_TABLET | ORAL | 0 refills | Status: AC

## 2017-05-01 MED ORDER — LORATADINE-PSEUDOEPHEDRINE ER 10-240 MG PO TB24: 1.0000 | ORAL_TABLET | Freq: Every day | ORAL | 0 refills | Status: DC

## 2017-05-01 MED ORDER — AZITHROMYCIN 250 MG PO TABS: ORAL_TABLET | ORAL | 0 refills | Status: DC

## 2017-05-01 NOTE — Discharge Instructions (Signed)
Please make sure that you continue trying to chew some gum.  If this is eustachian tube dysfunction causing your symptoms this should help.  For help with an ENT call [phone].

## 2017-05-01 NOTE — ED Triage Notes (Signed)
Pt sts right earache with hx of same

## 2017-05-01 NOTE — ED Provider Notes (Signed)
05/01/2017 5:45 PM   DOB: 1965-05-30 / MRN: [id number]  SUBJECTIVE:  Emily Wilson is a 52 y.o. female presenting for Ear pain and pressure that has been chronic at this point.  Tells me that when she takes antibiotics she feels better but the pain comes back.  She tells me "there is fluid behind the ear."  She tells me that her hearing in the right side is also diminishing, so much so that she has to wear her ear piece in the opposite ear.  She is allergic to codeine; hydrocodone-acetaminophen; meloxicam; naproxen; propoxyphene n-acetaminophen; sulfonamide derivatives; and tramadol.   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    She  reports that she quit smoking about 5 years ago. Her smoking use included cigarettes. She has a 90.00 pack-year smoking history. she has never used smokeless tobacco. She reports that she uses drugs. She reports that she does not drink alcohol. She  reports that she does not currently engage in sexual activity. The patient  has a past surgical history that includes Abdominal hysterectomy; Tubal ligation; and Appendectomy.  Her family history includes Bladder Cancer (age of onset: 57) in her mother; Breast cancer in her paternal aunt; COPD in her maternal grandmother and maternal uncle; Cancer (age of onset: 82) in her father; Dementia in her maternal uncle; Diabetes in her mother and paternal grandmother; Emphysema in her maternal grandmother; Multiple sclerosis in her mother and paternal aunt; Other (age of onset: 31) in her mother; Stroke in her maternal grandfather.  Review of Systems  Constitutional: Negative for chills, diaphoresis and fever.  Eyes: Negative.   Respiratory: Negative for cough, hemoptysis, sputum production, shortness of breath and wheezing.   Cardiovascular:  Negative for chest pain, orthopnea and leg swelling.  Gastrointestinal: Negative for nausea.  Skin: Negative for rash.  Neurological: Negative for dizziness, sensory change, speech change, focal weakness and headaches.    OBJECTIVE:  BP (!) 124/100 (BP Location: Right Arm)   Pulse 100   Temp 98.5 F (36.9 C) (Oral)   Resp 18   SpO2 97%   Physical Exam  Constitutional: She is active.  Non-toxic appearance.  HENT:  Right Ear: Hearing, tympanic membrane, external ear and ear canal normal.  Left Ear: Hearing, tympanic membrane, external ear and ear canal normal.  Nose: Nose normal. Right sinus exhibits no maxillary sinus tenderness and no frontal sinus tenderness. Left sinus exhibits no maxillary sinus tenderness and no frontal sinus tenderness.  Mouth/Throat: Uvula is midline, oropharynx is clear and moist and mucous membranes are normal. Mucous membranes are not dry. No oropharyngeal exudate, posterior oropharyngeal edema or tonsillar abscesses.  Cardiovascular: Normal rate.  Pulmonary/Chest: Effort normal. No tachypnea.  Lymphadenopathy:       Head (right side): No submandibular and no tonsillar adenopathy present.       Head (left side): No submandibular and no tonsillar adenopathy present.    She has no cervical adenopathy.  Neurological: She is alert.  Skin: Skin is warm and dry. She is not diaphoretic. No pallor.    No results found for this or any previous visit (from the past 72 hour(s)).  No results found.  ASSESSMENT AND PLAN:  No orders of the defined types were placed in this encounter.    Change in hearing of right ear: Her eardrum is unremarkable.  However she  is having a decrease in hearing.  She also complains of pain.  She has been on amoxicillin as well as Augmentin both of which provide her some relief however the problem returns.  She is not tried pseudoephedrine yet.  I am starting her on azithromycin to cover for an atypical along with  pseudoephedrine.  Ear pressure, right      The patient is advised to call or return to clinic if she does not see an improvement in symptoms, or to seek the care of the closest emergency department if she worsens with the above plan.   Philis Fendt, MHS, PA-C 05/01/2017 5:45 PM   Emily Coop, PA-C 05/01/17 1746

## 2017-05-09 ENCOUNTER — Ambulatory Visit (INDEPENDENT_AMBULATORY_CARE_PROVIDER_SITE_OTHER): Payer: Self-pay | Admitting: Otolaryngology

## 2017-05-09 DIAGNOSIS — H9209 Otalgia, unspecified ear: Secondary | ICD-10-CM

## 2017-06-15 ENCOUNTER — Encounter: Payer: Self-pay | Admitting: Family Medicine

## 2017-06-15 ENCOUNTER — Ambulatory Visit: Payer: Medicaid Other | Admitting: Family Medicine

## 2017-06-15 VITALS — BP 120/74 | HR 94 | Temp 98.2°F | Ht 59.0 in | Wt 157.2 lb

## 2017-06-15 DIAGNOSIS — C50311 Malignant neoplasm of lower-inner quadrant of right female breast: Secondary | ICD-10-CM | POA: Diagnosis not present

## 2017-06-15 DIAGNOSIS — F411 Generalized anxiety disorder: Secondary | ICD-10-CM

## 2017-06-15 DIAGNOSIS — IMO0001 Reserved for inherently not codable concepts without codable children: Secondary | ICD-10-CM

## 2017-06-15 DIAGNOSIS — E1165 Type 2 diabetes mellitus with hyperglycemia: Secondary | ICD-10-CM | POA: Diagnosis not present

## 2017-06-15 LAB — POCT GLYCOSYLATED HEMOGLOBIN (HGB A1C): Hemoglobin A1C: 8

## 2017-06-15 MED ORDER — ALPRAZOLAM 0.5 MG PO TABS: ORAL_TABLET | ORAL | 0 refills | Status: DC

## 2017-06-15 NOTE — Patient Instructions (Addendum)
POC A1c today.  I recommend you ask about Employee Assistance Program through work Geneticist, molecular through work).  I will refill xanax.  I will write you out for the next 3 days from work.  Consider daily medicine for mood.  Keep trying to apply for assistance.

## 2017-06-15 NOTE — Assessment & Plan Note (Addendum)
Ongoing difficulty with mood since father's passing 2017. Acute worsening anxiety with severe insomnia more recently due to work stressors. I also think she is suffering from depression. She does not want to try any daily antidepressant due to prior bad experience, although is aware there are multiple medications that can be tried. Discussed this as well as concerns with mood disorder affecting ability to make the best decisions she can when it comes to her medical care.  Xanax refilled today. Will write out of work x 3 days while she works on work stressors. Support provided. I encouraged she look into and take advantage of any available Employee Assistance Program. She agrees to this.

## 2017-06-15 NOTE — Assessment & Plan Note (Addendum)
Update A1c today. States diet controlled. Overdue for f/u.

## 2017-06-15 NOTE — Progress Notes (Signed)
BP 120/74 (BP Location: Left Arm, Patient Position: Sitting, Cuff Size: Normal)   Pulse 94   Temp 98.2 F (36.8 C) (Oral)   Ht _0  (1.499 m)   Wt 157 lb 4 oz (71.3 kg)   SpO2 95%   BMI 31.76 kg/m    CC: insomnia Subjective:    Patient ID: Emily Wilson, female    DOB: 06/11/1965, 52 y.o.   MRN: [id number]  HPI: Emily Wilson is a 52 y.o. female presenting on 06/15/2017 for Insomnia (States she has anxiety which is high right now due to a stressful situation at work. Has had trouble trying to fall asleep and staying asleep. 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 Ongoing stressors at work. This is affecting sleep. Has not slept in 2 days due to anxiety, mind racing - stays laying in bed without falling asleep. Wants to be taken out of work for the rest of the week. Feel she dl  Ongoing work stress since 07/2016. Works at [employer]. Looking to apply for new job.  Takes xanax PRN - ran out. Last filled 10/2015. Overdue for f/u with PCP.   Has been tried on antidepressants in the past with poor effect (palpitations that led to ER evaluation), resistant to trial of any antidepressant whatsoever.   Known h/o breast cancer has seen onc last 2017 not currently under treatment for this, unable to afford treatment or onc follow up, not interested in return to onc at this time. Tamoxifen made her sick - hot flashes, nausea and abdominal pain. Feels cancer has grown "I know it's now stage IV". "I already made my decision" to forgo any treatment. Lost father around time when breast cancer surgery was scheduled, never re scheduled due to financial concerns.   H/o DM - no recent check. Diet controlled. Non-fasting sugar this morning 163. Bought treadmill, walks 45 min/day.  Lab Results  Component Value Date   HGBA1C 8.0 06/15/2017    Relevant past  medical, surgical, family and social history reviewed and updated as indicated. Interim medical history since our last visit reviewed. Allergies and medications reviewed and updated. Outpatient Medications Prior to Visit  Medication Sig Dispense Refill  . Blood Glucose Monitoring Suppl (ONE TOUCH ULTRA 2) w/Device KIT 1 Device by Does not apply route once. 1 each 0  . glucose blood (ONE TOUCH TEST STRIPS) test strip 1 each by Other route 3 (three) times daily. 100 each 12  . Lancets (ONETOUCH ULTRASOFT) lancets 1 each by Other route 3 (three) times daily. 100 each 12  . loratadine-pseudoephedrine (CLARITIN-D 24 HOUR) 10-240 MG 24 hr tablet Take 1 tablet by mouth daily. 14 tablet 0  . ALPRAZolam (XANAX) 0.5 MG tablet TAKE ONE (1) TABLET BY MOUTH DAILY AS NEEDED FOR AXNIETY 30 tablet 0  . metFORMIN (GLUCOPHAGE) 500 MG tablet Take 1 tablet (500 mg total) by mouth 2 (two) times daily with a meal. 60 tablet 2  . simvastatin (ZOCOR) 20 MG tablet Take 1 tablet (20 mg total) by mouth at bedtime. 90 tablet 0  . tamoxifen (NOLVADEX) 20 MG tablet Take 1 tablet (20 mg total) by mouth daily. (Patient not taking: Reported on 06/15/2017) 90 tablet 3   No facility-administered medications prior to visit.      Per HPI unless specifically indicated in ROS section below Review of Systems  Objective:    BP 120/74 (BP Location: Left Arm, Patient Position: Sitting, Cuff Size: Normal)   Pulse 94   Temp 98.2 F (36.8 C) (Oral)   Ht _0  (1.499 m)   Wt 157 lb 4 oz (71.3 kg)   SpO2 95%   BMI 31.76 kg/m   Wt Readings from Last 3 Encounters:  06/15/17 157 lb 4 oz (71.3 kg)  07/20/16 145 lb (65.8 kg)  11/11/15 147 lb 9.6 oz (67 kg)    Physical Exam  Constitutional: She appears well-developed and well-nourished. No distress.  Psychiatric: Her speech is normal and behavior is normal. Thought content normal. Her mood appears anxious. Cognition and memory are normal. She exhibits a depressed mood.  Tearful  with discussion of stressors  Nursing note and vitals reviewed.  Results for orders placed or performed in visit on 06/15/17  POCT glycosylated hemoglobin (Hb A1C)  Result Value Ref Range   Hemoglobin A1C 8.0       Assessment & Plan:  Encouraged PCP f/u as overdue. Ongoing financial difficulties.  Problem List Items Addressed This Visit    Anxiety state - Primary    Ongoing difficulty with mood since father's passing 2017. Acute worsening anxiety with severe insomnia more recently due to work stressors. I also think she is suffering from depression. She does not want to try any daily antidepressant due to prior bad experience, although is aware there are multiple medications that can be tried. Discussed this as well as concerns with mood disorder affecting ability to make the best decisions she can when it comes to her medical care.  Xanax refilled today. Will write out of work x 3 days while she works on work stressors. Support provided. I encouraged she look into and take advantage of any available Employee Assistance Program. She agrees to this.       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 Saw onc, declined surgery so placed on tamoxifen, but stopped this due to side effects. Has not returned for f/u. Discussed concern with untreated breast cancer, pt declines further evaluation at this time. I will ask our care coordinator to see if she qualifies for any more financial assistance.       Relevant Medications   ALPRAZolam (XANAX) 0.5 MG tablet   Diabetes mellitus type 2, uncontrolled, without complications (HCC)    Update A1c today. States diet controlled. Overdue for f/u.       Relevant Orders   POCT glycosylated hemoglobin (Hb A1C) (Completed)       Meds ordered this encounter  Medications  . ALPRAZolam (XANAX) 0.5 MG tablet    Sig:  TAKE ONE (1) TABLET BY MOUTH DAILY AS NEEDED FOR AXNIETY    Dispense:  30 tablet    Refill:  0   Orders Placed This Encounter  Procedures  . POCT glycosylated hemoglobin (Hb A1C)    Follow up plan: No follow-ups on file.  Ria Bush, MD

## 2017-06-15 NOTE — Assessment & Plan Note (Signed)
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 Saw onc, declined surgery so placed on tamoxifen, but stopped this due to side effects. Has not returned for f/u. Discussed concern with untreated breast cancer, pt declines further evaluation at this time. I will ask our care coordinator to see if she qualifies for any more financial assistance.

## 2017-06-17 ENCOUNTER — Telehealth: Payer: Self-pay | Admitting: Internal Medicine

## 2017-06-17 NOTE — Telephone Encounter (Signed)
Copied from [place] [id number]. Topic: Quick Communication - Lab Results >> Jun 17, 2017  8:24 AM Brenton Grills, CMA wrote: Called patient to inform them of 06/15/17 lab results. When patient returns call, triage nurse may disclose results and message.

## 2017-06-18 NOTE — Telephone Encounter (Signed)
See result notes. 

## 2017-06-28 ENCOUNTER — Telehealth: Payer: Self-pay | Admitting: Internal Medicine

## 2017-06-28 NOTE — Telephone Encounter (Signed)
Appointment 5/10

## 2017-06-28 NOTE — Telephone Encounter (Signed)
PT dropped off FMLA ppw to be filled out. I placed in blue envelope and placed on Robin's desk.

## 2017-06-28 NOTE — Telephone Encounter (Signed)
Will need appt to discuss as I did not see her for this.

## 2017-07-01 ENCOUNTER — Encounter: Payer: Self-pay | Admitting: Internal Medicine

## 2017-07-01 ENCOUNTER — Ambulatory Visit: Payer: Self-pay | Admitting: Internal Medicine

## 2017-07-01 VITALS — BP 124/84 | HR 106 | Temp 98.3°F | Wt 155.0 lb

## 2017-07-01 DIAGNOSIS — E1165 Type 2 diabetes mellitus with hyperglycemia: Secondary | ICD-10-CM | POA: Diagnosis not present

## 2017-07-01 DIAGNOSIS — Z0289 Encounter for other administrative examinations: Secondary | ICD-10-CM | POA: Diagnosis not present

## 2017-07-01 DIAGNOSIS — F419 Anxiety disorder, unspecified: Secondary | ICD-10-CM | POA: Diagnosis not present

## 2017-07-01 NOTE — Telephone Encounter (Signed)
Paperwork in regina's in box 

## 2017-07-01 NOTE — Telephone Encounter (Signed)
Gave paperwork to [facility]

## 2017-07-01 NOTE — Progress Notes (Signed)
Subjective:    Patient ID: Emily Wilson, female    DOB: 01-22-1966, 52 y.o.   MRN: [id number]  HPI  Pt presents to the clinic today to discuss anxiety. She reports this started about 2 years ago ager her father passed away. Then, she was diagnosed with breast cancer in 2017. She has not been able to get treatment because she doe not have insurance. Because of her cancer diagnosis, she had to leave a job she loved after 16 years. Since that time, she has been working part time at [employer]. She reports she is having issues with her coworkers and bosses. Her anxiety has gotten so bad that she can't stand to go to work. She feels anxious and on edge while at work. She reports she really needs some time away from work to focus on her mental health. She denies depression, SI/HI. She takes Xanax on a very rare, as needed basis.  Her most recent A1C was 8%. She is currently not taking any medication to lower her glucose at this time secondary to finances. She does not check her sugars. She checks her feet daily. Her last eye exam was > 1 year ago.   Review of Systems      Past Medical History:  Diagnosis Date  . Anxiety state, unspecified   . Heart murmur   . Hyperlipidemia   . Hyperthyroidism   . Smoker   . 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 ALPRAZolam (XANAX) 0.5 MG tablet TAKE ONE (1) TABLET BY MOUTH DAILY AS NEEDED FOR AXNIETY 30 tablet 0  . Blood Glucose Monitoring Suppl (ONE TOUCH ULTRA 2) w/Device KIT 1 Device by Does not apply route once. 1 each 0  . glucose blood (ONE TOUCH TEST STRIPS) test strip 1 each by Other route 3 (three) times daily. 100 each 12  . Lancets (ONETOUCH ULTRASOFT) lancets 1 each by Other route 3 (three) times daily. 100 each 12  . loratadine-pseudoephedrine (CLARITIN-D 24 HOUR) 10-240 MG 24 hr tablet Take 1 tablet by  mouth daily. 14 tablet 0   No current facility-administered medications for this visit.     Allergies  Allergen Reactions  . Codeine     hives  . Hydrocodone-Acetaminophen     hives  . Meloxicam Other (See Comments)    Abdominal pain   . Naproxen     Cant breath  . Propoxyphene N-Acetaminophen     hives  . Sulfonamide Derivatives     Hives   . Tramadol Other (See Comments)    Abdominal Pain    Family History  Problem Relation Age of Onset  . Diabetes Mother   . Bladder Cancer Mother 29       smoker  . Other Mother 32       TAH for unspecified reason  . Multiple sclerosis Mother   . Cancer Father 27       lymphatic/tonsil cancer - in remission; former smoker  . COPD Maternal Grandmother        smoker  . Emphysema Maternal Grandmother        smoker  . Stroke Maternal Grandfather   . Dementia Maternal Uncle   . Diabetes Paternal Grandmother   . COPD Maternal Uncle        smoker  . Multiple sclerosis Paternal Aunt   . Breast cancer Paternal  Aunt        dx. late 3s - early 1s    Social History   Socioeconomic History  . Marital status: Divorced    Spouse name: Not on file  . Number of children: Not on file  . Years of education: Not on file  . Highest education level: Not on file  Occupational History  . Occupation: Investment banker, operational  . Financial resource strain: Not on file  . Food insecurity:    Worry: Not on file    Inability: Not on file  . Transportation needs:    Medical: Not on file    Non-medical: Not on file  Tobacco Use  . Smoking status: Former Smoker    Packs/day: 3.00    Years: 30.00    Pack years: 90.00    Types: Cigarettes    Last attempt to quit: 10/24/2011    Years since quitting: 5.6  . Smokeless tobacco: Never Used  Substance and Sexual Activity  . Alcohol use: No  . Drug use: Yes  . Sexual activity: Not Currently  Lifestyle  . Physical activity:    Days per week: Not on file    Minutes per session: Not on file  .  Stress: Not on file  Relationships  . Social connections:    Talks on phone: Not on file    Gets together: Not on file    Attends religious service: Not on file    Active member of club or organization: Not on file    Attends meetings of clubs or organizations: Not on file    Relationship status: Not on file  . Intimate partner violence:    Fear of current or ex partner: Not on file    Emotionally abused: Not on file    Physically abused: Not on file    Forced sexual activity: Not on file  Other Topics Concern  . Not on file  Social History Narrative   In [place] w/boyfriend   Works [employer], Scientist, water quality     Constitutional: Denies fever, malaise, fatigue, headache or abrupt weight changes.  Respiratory: Denies difficulty breathing, shortness of breath, cough or sputum production.   Cardiovascular: Denies chest pain, chest tightness, palpitations or swelling in the hands or feet.  Neurological: Denies dizziness, difficulty with memory, difficulty with speech or problems with balance and coordination.  Psych: Pt reports anxiety. Denies depression, SI/HI.  No other specific complaints in a complete review of systems (except as listed in HPI above).  Objective:   Physical Exam  BP 124/84   Pulse (!) 106   Temp 98.3 F (36.8 C) (Oral)   Wt 155 lb (70.3 kg)   SpO2 98%   BMI 31.31 kg/m  Wt Readings from Last 3 Encounters:  07/01/17 155 lb (70.3 kg)  06/15/17 157 lb 4 oz (71.3 kg)  07/20/16 145 lb (65.8 kg)    General: Appears her stated age, well developed, well nourished in NAD. Skin: No ulcerations noted. Cardiovascular: Tachycardic with normal rhythm. S1,S2 noted.  No murmur, rubs or gallops noted.  Pulmonary/Chest: Normal effort and positive vesicular breath sounds. No respiratory distress. No wheezes, rales or ronchi noted.  Neurological: Alert and oriented. Sensation intact to BLE. Psychiatric: She is anxious appearing today.  BMET    Component Value Date/Time   NA  138 11/04/2015 1045   K 4.5 11/04/2015 1045   CL 105 11/04/2015 1045   CO2 28 11/04/2015 1045   GLUCOSE 134 (H) 11/04/2015 1045  BUN 17 11/04/2015 1045   CREATININE 0.72 11/04/2015 1045   CALCIUM 9.4 11/04/2015 1045   GFRNONAA >60 07/14/2010 1050   GFRAA  07/14/2010 1050    >60        The eGFR has been calculated using the MDRD equation. This calculation has not been validated in all clinical situations. eGFR's persistently <60 mL/min signify possible Chronic Kidney Disease.    Lipid Panel     Component Value Date/Time   CHOL 299 (H) 11/04/2015 1045   TRIG 263.0 (H) 11/04/2015 1045   HDL 49.40 11/04/2015 1045   CHOLHDL 6 11/04/2015 1045   VLDL 52.6 (H) 11/04/2015 1045    CBC    Component Value Date/Time   WBC 8.2 11/04/2015 1045   RBC 4.71 11/04/2015 1045   HGB 15.0 11/04/2015 1045   HCT 43.7 11/04/2015 1045   PLT 382.0 11/04/2015 1045   MCV 92.7 11/04/2015 1045   MCH 32.3 07/14/2010 1050   MCHC 34.3 11/04/2015 1045   RDW 12.5 11/04/2015 1045   LYMPHSABS 3.5 07/14/2010 1050   MONOABS 0.8 07/14/2010 1050   EOSABS 0.2 07/14/2010 1050   BASOSABS 0.1 07/14/2010 1050    Hgb A1C Lab Results  Component Value Date   HGBA1C 8.0 06/15/2017            Assessment & Plan:   Encounter for Form Completion, Anxiety:  Support offered today Discussed treatment with SSRI therapy, but she wants to hold off at this time Offered referral to therapy but she declines due to finances Continue Xanax prn- discussed sedation and addiction risks FMLA forms filled out and will be faxed when done  DM 2, Uncontrolled with Hyperglycemia:  She stopped Metformin due to side effects She is refusing Glipizide 5 mg BID or any other medication at this time due to finances She thinks she can get her A1C down with diet and exercise Foot exam today Encouraged eye exam She declines flu and pneumovax secondary to financial reasons  Return precautions discussed Webb Silversmith,  NP  Return precautions discussed Webb Silversmith, NP

## 2017-07-02 NOTE — Patient Instructions (Signed)

## 2017-07-04 NOTE — Telephone Encounter (Signed)
Signed, given back to [facility]

## 2017-07-04 NOTE — Telephone Encounter (Signed)
Paperwork faxed °

## 2017-07-05 NOTE — Telephone Encounter (Signed)
Pt aware °Copy for scan °Copy for pt °

## 2017-07-07 ENCOUNTER — Telehealth: Payer: Self-pay | Admitting: Internal Medicine

## 2017-07-07 NOTE — Telephone Encounter (Signed)
Copied from [place] [id number]. Topic: Quick Communication - See Telephone Encounter >> Jul 07, 2017  3:42 PM Emily Wilson wrote: CRM for notification. See Telephone encounter for: 07/07/17. Patient would like to move forward with whatever medicine Webb Silversmith recommends that cost less than metformin. [facility] [place]

## 2017-07-08 MED ORDER — GLIPIZIDE 5 MG PO TABS: 5.0000 mg | ORAL_TABLET | Freq: Two times a day (BID) | ORAL | 2 refills | Status: DC

## 2017-07-08 NOTE — Telephone Encounter (Signed)
I wanted to put her on Glipizide 5 mg BID. If she is okay with this, send in #60, 2 refills. Follow up with me in 3 months.

## 2017-07-08 NOTE — Telephone Encounter (Signed)
Pt is aware Rx sent through [facility]-scribe Pt will let us know if this medication is not affordable

## 2017-07-08 NOTE — Addendum Note (Signed)
Addended by: Lurlean Nanny on: 07/08/2017 05:11 PM   Modules accepted: Orders

## 2017-09-19 ENCOUNTER — Telehealth: Payer: Self-pay

## 2017-09-19 DIAGNOSIS — E1165 Type 2 diabetes mellitus with hyperglycemia: Secondary | ICD-10-CM

## 2017-09-19 NOTE — Telephone Encounter (Signed)
She needs to come for POCT A1C

## 2017-09-19 NOTE — Telephone Encounter (Signed)
Pt last seen 07/01/17. Please advise.

## 2017-09-19 NOTE — Telephone Encounter (Signed)
Copied from [place] [id number]. Topic: General - Other >> Sep 19, 2017 10:09 AM Yvette Rack wrote: Reason for CRM: Pt states she got a really sharp pain under her left ribs after taking the glipiZIDE (GLUCOTROL) 5 MG tablet so she has quit taking it. Pt states she has not returned to work as she is waiting on disability.

## 2017-09-27 ENCOUNTER — Telehealth: Payer: Self-pay | Admitting: Internal Medicine

## 2017-09-27 NOTE — Telephone Encounter (Signed)
Copied from [place] [id number]. Topic: Quick Communication - Office Called Patient >> Sep 27, 2017  2:21 PM Cecelia Byars, [place] wrote: Reason for CRM: Patient called returning [place] call ,please call her at [phone]  [phone]

## 2017-09-27 NOTE — Addendum Note (Signed)
Addended by: Lurlean Nanny on: 09/27/2017 12:08 PM   Modules accepted: Orders

## 2017-09-27 NOTE — Telephone Encounter (Signed)
Left message on voicemail.

## 2017-09-30 NOTE — Telephone Encounter (Signed)
Pt now has [place] access MCD, we can no longer see pt... Pt was given a list of providers to est with... Pt wants medication to be able to take in the meantime so her A1C does not get out of control... Please advise

## 2017-10-01 ENCOUNTER — Other Ambulatory Visit: Payer: Self-pay | Admitting: Internal Medicine

## 2017-10-01 MED ORDER — GLIMEPIRIDE 4 MG PO TABS: 4.0000 mg | ORAL_TABLET | Freq: Every day | ORAL | 1 refills | Status: DC

## 2017-10-01 NOTE — Telephone Encounter (Signed)
I can send in some Amaryl.

## 2017-10-05 NOTE — Telephone Encounter (Signed)
Pt would like to try Amaryl, please send in to the pharmacy

## 2017-10-05 NOTE — Telephone Encounter (Signed)
Amaryl sent to pharmacy

## 2017-12-21 DIAGNOSIS — M47816 Spondylosis without myelopathy or radiculopathy, lumbar region: Secondary | ICD-10-CM | POA: Diagnosis not present

## 2017-12-21 DIAGNOSIS — M25561 Pain in right knee: Secondary | ICD-10-CM | POA: Diagnosis not present

## 2017-12-21 DIAGNOSIS — M2041 Other hammer toe(s) (acquired), right foot: Secondary | ICD-10-CM | POA: Diagnosis not present

## 2018-02-16 ENCOUNTER — Encounter: Payer: Self-pay | Admitting: Internal Medicine

## 2018-02-28 ENCOUNTER — Other Ambulatory Visit: Payer: Self-pay

## 2018-02-28 ENCOUNTER — Encounter (HOSPITAL_COMMUNITY): Payer: Self-pay | Admitting: Emergency Medicine

## 2018-02-28 ENCOUNTER — Ambulatory Visit (HOSPITAL_COMMUNITY)
Admission: EM | Admit: 2018-02-28 | Discharge: 2018-02-28 | Disposition: A | Discharge status: Home / Self Care | Payer: Medicaid Other | Attending: Emergency Medicine | Admitting: Emergency Medicine

## 2018-02-28 DIAGNOSIS — J0100 Acute maxillary sinusitis, unspecified: Secondary | ICD-10-CM | POA: Insufficient documentation

## 2018-02-28 DIAGNOSIS — E1165 Type 2 diabetes mellitus with hyperglycemia: Secondary | ICD-10-CM | POA: Insufficient documentation

## 2018-02-28 DIAGNOSIS — J111 Influenza due to unidentified influenza virus with other respiratory manifestations: Secondary | ICD-10-CM | POA: Insufficient documentation

## 2018-02-28 LAB — POCT I-STAT, CHEM 8
BUN: 6 mg/dL (ref 6–20)
Calcium, Ion: 1.12 mmol/L — ABNORMAL LOW (ref 1.15–1.40)
Chloride: 98 mmol/L (ref 98–111)
Creatinine, Ser: 0.6 mg/dL (ref 0.44–1.00)
Glucose, Bld: 233 mg/dL — ABNORMAL HIGH (ref 70–99)
HCT: 47 % — ABNORMAL HIGH (ref 36.0–46.0)
Hemoglobin: 16 g/dL — ABNORMAL HIGH (ref 12.0–15.0)
Potassium: 4.4 mmol/L (ref 3.5–5.1)
Sodium: 134 mmol/L — ABNORMAL LOW (ref 135–145)
TCO2: 26 mmol/L (ref 22–32)

## 2018-02-28 LAB — POCT URINALYSIS DIP (DEVICE)
Bilirubin Urine: NEGATIVE
Glucose, UA: 500 mg/dL — AB
Hgb urine dipstick: NEGATIVE
Ketones, ur: NEGATIVE mg/dL
Leukocytes, UA: NEGATIVE
Nitrite: NEGATIVE
Protein, ur: NEGATIVE mg/dL
Specific Gravity, Urine: 1.02 (ref 1.005–1.030)
Urobilinogen, UA: 0.2 mg/dL (ref 0.0–1.0)
pH: 6.5 (ref 5.0–8.0)

## 2018-02-28 MED ORDER — DOXYCYCLINE HYCLATE 100 MG PO CAPS: 100.0000 mg | ORAL_CAPSULE | Freq: Two times a day (BID) | ORAL | 0 refills | Status: AC

## 2018-02-28 MED ORDER — OSELTAMIVIR PHOSPHATE 75 MG PO CAPS: 75.0000 mg | ORAL_CAPSULE | Freq: Two times a day (BID) | ORAL | 0 refills | Status: DC

## 2018-02-28 NOTE — ED Triage Notes (Signed)
PT adds that she was diagnosed with breast cancer in 2017. She missed her surgery and then lost her medicaid. PT has not followed up since.

## 2018-02-28 NOTE — ED Triage Notes (Signed)
PT reports vomiting and diarrhea started yesterday. Later in the day she developed chills, bodyaches, cough, back pain, neck pain.

## 2018-02-28 NOTE — Discharge Instructions (Addendum)
Tamiflu for the influenza, doxycycline for the sinus infection, Mucinex D will help decongest you and make your mucus thin so that you can wash it out, saline nasal irrigation with distilled water and a NeilMed sinus rinse as often as you want.    Restart diclofenac when she is able to get food on her stomach.  In the meantime, start 1 g of Tylenol 4 times a day.  Continue pushing electrolyte containing fluids.  Follow-up with the [facility] clinic or with a primary care physician of your choice, see list below.  I have placed a referral to have somebody help you find a primary care physician as well.  Below is a list of primary care practices who are taking new patients for you to follow-up with. [facility] [place], [place] [address] [phone]  [facility] [phone] [place] [address]  [place] [facility]: [place]  [phone]  [facility]: [place]   [phone]  [facility]: [address] [place]  [phone]  [facility] [facility] : [address] [phone]  [facility]: [address] [place] [id number] [phone]  [facility]: [place] [phone]  Dr. Blanchie Serve [place]  [phone]  Dr. Benito Mccreedy, [facility]. [address] [place], [place] [address]  [phone]  Go to www.goodrx.com to look up your medications. This will give you a list of where you can find your prescriptions at the most affordable prices. Or ask the pharmacist what  the cash price is, or if they have any other discount programs available to help make your medication more affordable. This can be less expensive than what you would pay with insurance.

## 2018-02-28 NOTE — ED Provider Notes (Signed)
HPI  SUBJECTIVE:  Emily Wilson is a 53 y.o. female who presents with the acute onset of fevers, diffuse body aches, headaches, nasal congestion, nonproductive cough, wheezing, sore throat, right-sided ear pain.  No change in her hearing.  She reports 4-5 episodes of nonbilious, nonbloody emesis and watery, nonbloody diarrhea yesterday.  She denies a change in urine output.  She is tolerating p.o. today-states that she is pushing Pedialyte.  She reports anorexia.  No rhinorrhea, chest pain, shortness of breath, postnasal drip.  No dyspnea on exertion.  No abdominal pain, urinary complaints.  No raw or undercooked foods, questionable leftovers, recent travel, recent antibiotics.  she states her son currently has influenza.  She also reports right-sided epistaxis, right-sided sinus pain and pressure.  She has tried Pedialyte, Claritin, Neosporin for the epistaxis, rest.  Symptoms are better with rest, worse with exertion.  No antipyretic in the past 4 to 6 hours.  She has a past medical history of untreated right breast cancer which was diagnosed in 2017.  Was supposed to have surgery but states that she missed that because her father died 2 days prior to the surgery.  She also has a past medical history of diabetes, has not been on medications in over 7 months.  States that the metformin, glipizide gave her abdominal pain so she stopped taking it.  States that her glucose has been running in the 200s and 300s during this time.  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  She is a former smoker.  No history of DKA/HHNK.  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  No history of hypertension.  PMD: None.    Past Medical History:  Diagnosis Date  . Anxiety state, unspecified   . Heart murmur   . Hyperlipidemia   . Hyperthyroidism   . Smoker   . Thyrotoxicosis  without mention of goiter or other cause, without mention of thyrotoxic crisis or storm     Past Surgical History:  Procedure Laterality Date  . ABDOMINAL HYSTERECTOMY    . APPENDECTOMY    . TUBAL LIGATION      Family History  Problem Relation Age of Onset  . Diabetes Mother   . Bladder Cancer Mother 61       smoker  . Other Mother 57       TAH for unspecified reason  . Multiple sclerosis Mother   . Cancer Father 14       lymphatic/tonsil cancer - in remission; former smoker  . COPD Maternal Grandmother        smoker  . Emphysema Maternal Grandmother        smoker  . Stroke Maternal Grandfather   . Dementia Maternal Uncle   . Diabetes Paternal Grandmother   . COPD Maternal Uncle        smoker  . Multiple sclerosis Paternal Aunt   . Breast cancer Paternal Aunt        dx. late 11s - early 18s    Social History   Tobacco Use  . Smoking status: Former Smoker    Packs/day: 3.00    Years: 30.00    Pack years: 90.00    Types: Cigarettes    Last attempt to quit: 10/24/2011    Years since quitting: 6.3  . Smokeless tobacco: Never Used  Substance Use Topics  . Alcohol use: No  . Drug  use: Yes    No current facility-administered medications for this encounter.   Current Outpatient Medications:  .  diclofenac (VOLTAREN) 75 MG EC tablet, Take 75 mg by mouth 2 (two) times daily., Disp: , Rfl:  .  ALPRAZolam (XANAX) 0.5 MG tablet, TAKE ONE (1) TABLET BY MOUTH DAILY AS NEEDED FOR AXNIETY, Disp: 30 tablet, Rfl: 0 .  Blood Glucose Monitoring Suppl (ONE TOUCH ULTRA 2) w/Device KIT, 1 Device by Does not apply route once., Disp: 1 each, Rfl: 0 .  doxycycline (VIBRAMYCIN) 100 MG capsule, Take 1 capsule (100 mg total) by mouth 2 (two) times daily for 7 days., Disp: 14 capsule, Rfl: 0 .  glucose blood (ONE TOUCH TEST STRIPS) test strip, 1 each by Other route 3 (three) times daily., Disp: 100 each, Rfl: 12 .  Lancets (ONETOUCH ULTRASOFT) lancets, 1 each by Other route 3 (three) times  daily., Disp: 100 each, Rfl: 12 .  oseltamivir (TAMIFLU) 75 MG capsule, Take 1 capsule (75 mg total) by mouth 2 (two) times daily. X 5 days, Disp: 10 capsule, Rfl: 0  Allergies  Allergen Reactions  . Codeine     hives  . Hydrocodone-Acetaminophen     hives  . Meloxicam Other (See Comments)    Abdominal pain   . Naproxen     Cant breath  . Propoxyphene N-Acetaminophen     hives  . Sulfonamide Derivatives     Hives   . Tramadol Other (See Comments)    Abdominal Pain     ROS  As noted in HPI.   Physical Exam  BP (!) 150/82   Pulse (!) 127   Temp (!) 100.4 F (38 C) (Temporal)   Resp 16   SpO2 97%   Constitutional: Well developed, well nourished, no acute distress Eyes: PERRL, EOMI, conjunctiva normal bilaterally HENT: Normocephalic, atraumatic,mucus membranes moist.  TMs normal bilaterally.  Mucoid nasal congestion.  Right maxillary sinus tenderness.  No obvious postnasal drip.  Normal tonsils without exudates. Neck: No cervical lymphadenopathy, meningismus. Respiratory: Clear to auscultation bilaterally, no rales, no wheezing, no rhonchi Cardiovascular: Regular tachycardia, no murmurs, no gallops, no rubs GI: Soft, nondistended, normal bowel sounds, nontender, no rebound, no guarding Back: no CVAT skin: No rash, skin intact Musculoskeletal: No edema, no tenderness, no deformities Neurologic: Alert & oriented x 3, CN II-XII grossly intact, no motor deficits, sensation grossly intact Psychiatric: Speech and behavior appropriate   ED Course   Medications - No data to display  Orders Placed This Encounter  Procedures  . I-stat chem 8    Standing Status:   Standing    Number of Occurrences:   1  . I-STAT, chem 8    Standing Status:   Standing    Number of Occurrences:   1  . POCT urinalysis dip (device)    Standing Status:   Standing    Number of Occurrences:   1   Results for orders placed or performed during the hospital encounter of 02/28/18 (from the  past 24 hour(s))  I-STAT, chem 8     Status: Abnormal   Collection Time: 02/28/18  5:41 PM  Result Value Ref Range   Sodium 134 (L) 135 - 145 mmol/L   Potassium 4.4 3.5 - 5.1 mmol/L   Chloride 98 98 - 111 mmol/L   BUN 6 6 - 20 mg/dL   Creatinine, Ser 0.60 0.44 - 1.00 mg/dL   Glucose, Bld 233 (H) 70 - 99 mg/dL   Calcium, Ion 1.12 (  L) 1.15 - 1.40 mmol/L   TCO2 26 22 - 32 mmol/L   Hemoglobin 16.0 (H) 12.0 - 15.0 g/dL   HCT 47.0 (H) 36.0 - 46.0 %  POCT urinalysis dip (device)     Status: Abnormal   Collection Time: 02/28/18  5:43 PM  Result Value Ref Range   Glucose, UA 500 (A) NEGATIVE mg/dL   Bilirubin Urine NEGATIVE NEGATIVE   Ketones, ur NEGATIVE NEGATIVE mg/dL   Specific Gravity, Urine 1.020 1.005 - 1.030   Hgb urine dipstick NEGATIVE NEGATIVE   pH 6.5 5.0 - 8.0   Protein, ur NEGATIVE NEGATIVE mg/dL   Urobilinogen, UA 0.2 0.0 - 1.0 mg/dL   Nitrite NEGATIVE NEGATIVE   Leukocytes, UA NEGATIVE NEGATIVE   No results found.  ED Clinical Impression  Influenza  Acute non-recurrent maxillary sinusitis  Uncontrolled type 2 diabetes mellitus with hyperglycemia [facility])   ED Assessment/Plan   previous records reviewed.  Was last seen at [facility] primary care in 06/2017.  She had uncontrolled diabetes with hyperglycemia, she stopped metformin and glipizide due to side effects, states that it made her "stomach hurt". Wanted to try controlling her diabetes with diet and exercise. There was also note stating that she did not follow-up with oncology due to financial reasons and that she was not interested in following up with them at that point in time, but when I talked to her, she states that she is interested in following up with oncology but needs a referral from her primary care physician because she has Medicaid.  She states that her primary care provider that she saw last year is not able to see her anymore.  She is requesting assistance in finding a new primary care provider.  we also  discussed that the [facility] clinic may be a good option.  Labs reviewed.  Hyperglycemic, but not acidotic.  Her kidney function is normal.  She appears to be slightly hemoconcentrated.  No ketones in her urine.  Presentation consistent with influenza, but she also has sinus tenderness suggestive of a sinusitis.  Plan to send home with Tamiflu, doxycycline, Mucinex D, saline nasal irrigation.  No Flonase as she reports epistaxis on the right side.  Restart diclofenac when she is able to get food on her stomach.  Advised her to start 1 g of Tylenol 4 times a day.  Will order primary care referral and also provide a primary care referral list.  To the ER if she gets worse.  Discussed labs, MDM, treatment plan, and plan for follow-up with patient Discussed sn/sx that should prompt return to the ED. patient agrees with plan.   Meds ordered this encounter  Medications  . doxycycline (VIBRAMYCIN) 100 MG capsule    Sig: Take 1 capsule (100 mg total) by mouth 2 (two) times daily for 7 days.    Dispense:  14 capsule    Refill:  0  . oseltamivir (TAMIFLU) 75 MG capsule    Sig: Take 1 capsule (75 mg total) by mouth 2 (two) times daily. X 5 days    Dispense:  10 capsule    Refill:  0    *This clinic note was created using Lobbyist. Therefore, there may be occasional mistakes despite careful proofreading.  ?   Melynda Ripple, MD 02/28/18 2057

## 2018-03-08 ENCOUNTER — Telehealth: Payer: Self-pay | Admitting: Family Medicine

## 2018-03-08 ENCOUNTER — Ambulatory Visit (INDEPENDENT_AMBULATORY_CARE_PROVIDER_SITE_OTHER): Payer: Medicaid Other | Admitting: Family Medicine

## 2018-03-08 ENCOUNTER — Ambulatory Visit (INDEPENDENT_AMBULATORY_CARE_PROVIDER_SITE_OTHER): Payer: Medicaid Other

## 2018-03-08 ENCOUNTER — Encounter: Payer: Self-pay | Admitting: Family Medicine

## 2018-03-08 VITALS — BP 131/83 | HR 77 | Temp 98.0°F | Resp 18 | Ht 59.0 in | Wt 153.4 lb

## 2018-03-08 DIAGNOSIS — R059 Cough, unspecified: Secondary | ICD-10-CM

## 2018-03-08 DIAGNOSIS — R0789 Other chest pain: Secondary | ICD-10-CM

## 2018-03-08 DIAGNOSIS — R05 Cough: Secondary | ICD-10-CM | POA: Diagnosis not present

## 2018-03-08 DIAGNOSIS — R0602 Shortness of breath: Secondary | ICD-10-CM | POA: Diagnosis not present

## 2018-03-08 DIAGNOSIS — C50311 Malignant neoplasm of lower-inner quadrant of right female breast: Secondary | ICD-10-CM

## 2018-03-08 DIAGNOSIS — R062 Wheezing: Secondary | ICD-10-CM | POA: Diagnosis not present

## 2018-03-08 MED ORDER — ALBUTEROL SULFATE HFA 108 (90 BASE) MCG/ACT IN AERS: 2.0000 | INHALATION_SPRAY | Freq: Four times a day (QID) | RESPIRATORY_TRACT | 0 refills | Status: DC | PRN

## 2018-03-08 MED ORDER — IPRATROPIUM BROMIDE 0.03 % NA SOLN: 2.0000 | Freq: Three times a day (TID) | NASAL | 0 refills | Status: DC

## 2018-03-08 MED ORDER — BENZONATATE 100 MG PO CAPS: 100.0000 mg | ORAL_CAPSULE | Freq: Three times a day (TID) | ORAL | 0 refills | Status: DC | PRN

## 2018-03-08 MED ORDER — CETIRIZINE HCL 10 MG PO TABS: 10.0000 mg | ORAL_TABLET | Freq: Every day | ORAL | 11 refills | Status: DC

## 2018-03-08 NOTE — Telephone Encounter (Signed)
Patient notified of xray results & recommendations. Expressed understanding. 

## 2018-03-08 NOTE — Patient Instructions (Addendum)
Thank you for choosing Primary Care at [facility] for your medical home!    Emily Wilson was seen by Molli Barrows, FNP today.   Emily Wilson primary care doctor is Scot Jun, FNP.   For the best care possible,  you should try to see Molli Barrows, FNP-C  whenever you come to clinic.   We look forward to seeing you again soon!  If you have any questions about your visit today,  please call us at [phone]  Or feel free to reach your provider via [place].      A referral has been placed for you to oncology for further follow-up and evaluation of previously diagnosed breast cancer.  Someone from the cancer center should reach out to contact you to schedule an appointment within 5 to 7 days.  If you have not heard from anyone from the cancer center please follow-up with me here in the office.   Take all medication as prescribed for current symptoms.  You will be notified via phone of results of your chest x-ray taken in office today.  I would like for you to schedule a follow-up visit within 7 to 10 days in order to evaluate your diabetes and any other chronic conditions that you have.  At your next visit please schedule in the morning and she will need to come in fasting for lab work.    Cough, Adult  A cough helps to clear your throat and lungs. A cough may last only 2-3 weeks (acute), or it may last longer than 8 weeks (chronic). Many different things can cause a cough. A cough may be a sign of an illness or another medical condition. Follow these instructions at home:  Pay attention to any changes in your cough.  Take medicines only as told by your doctor. ? If you were prescribed an antibiotic medicine, take it as told by your doctor. Do not stop taking it even if you start to feel better. ? Talk with your doctor before you try using a cough medicine.  Drink enough fluid to keep your pee (urine) clear or pale yellow.  If the air is dry, use a  cold steam vaporizer or humidifier in your home.  Stay away from things that make you cough at work or at home.  If your cough is worse at night, try using extra pillows to raise your head up higher while you sleep.  Do not smoke, and try not to be around smoke. If you need help quitting, ask your doctor.  Do not have caffeine.  Do not drink alcohol.  Rest as needed. Contact a doctor if:  You have new problems (symptoms).  You cough up yellow fluid (pus).  Your cough does not get better after 2-3 weeks, or your cough gets worse.  Medicine does not help your cough and you are not sleeping well.  You have pain that gets worse or pain that is not helped with medicine.  You have a fever.  You are losing weight and you do not know why.  You have night sweats. Get help right away if:  You cough up blood.  You have trouble breathing.  Your heartbeat is very fast. This information is not intended to replace advice given to you by your health care provider. Make sure you discuss any questions you have with your health care provider. Document Released: 10/22/2010 Document Revised: 07/17/2015 Document Reviewed: 04/17/2014 Elsevier Interactive Patient Education  2019 [employer].  Postnasal Drip Postnasal drip is the feeling of mucus going down the back of your throat. Mucus is a slimy substance that moistens and cleans your nose and throat, as well as the air pockets in face bones near your forehead and cheeks (sinuses). Small amounts of mucus pass from your nose and sinuses down the back of your throat all the time. This is normal. When you produce too much mucus or the mucus gets too thick, you can feel it. Some common causes of postnasal drip include:  Having more mucus because of: ? A cold or the flu. ? Allergies. ? Cold air. ? Certain medicines.  Having more mucus that is thicker because of: ? A sinus or nasal infection. ? Dry air. ? A food allergy. Follow these  instructions at home: Relieving discomfort   Gargle with a salt-water mixture 3-4 times a day or as needed. To make a salt-water mixture, completely dissolve -1 tsp of salt in 1 cup of warm water.  If the air in your home is dry, use a humidifier to add moisture to the air.  Use a saline spray or container (neti pot) to flush out the nose (nasal irrigation). These methods can help clear away mucus and keep the nasal passages moist. General instructions  Take over-the-counter and prescription medicines only as told by your health care provider.  Follow instructions from your health care provider about eating or drinking restrictions. You may need to avoid caffeine.  Avoid things that you know you are allergic to (allergens), like dust, mold, pollen, pets, or certain foods.  Drink enough fluid to keep your urine pale yellow.  Keep all follow-up visits as told by your health care provider. This is important. Contact a health care provider if:  You have a fever.  You have a sore throat.  You have difficulty swallowing.  You have headache.  You have sinus pain.  You have a cough that does not go away.  The mucus from your nose becomes thick and is green or yellow in color.  You have cold or flu symptoms that last more than 10 days. Summary  Postnasal drip is the feeling of mucus going down the back of your throat.  If your health care provider approves, use nasal irrigation or a nasal spray 2?4 times a day.  Avoid things that you know you are allergic to (allergens), like dust, mold, pollen, pets, or certain foods. This information is not intended to replace advice given to you by your health care provider. Make sure you discuss any questions you have with your health care provider. Document Released: 05/24/2016 Document Revised: 05/24/2016 Document Reviewed: 05/24/2016 Elsevier Interactive Patient Education  2019 [employer].

## 2018-03-08 NOTE — Telephone Encounter (Signed)
Notify patient that recent chest x-ray was normal and did not show pneumonia or any abnormalities of the lung.  She should continue with therapy as prescribed.

## 2018-03-08 NOTE — Progress Notes (Signed)
Emily Wilson, is a 53 y.o. female  [id number]  GYJ:[id number]  DOB - 1965-10-10  CC:  Chief Complaint  Patient presents with  . Establish Care  . Follow-up    UC 1/7: flu, sinus infection       HPI: Emily Wilson is a 53 y.o. female is here today to establish care and urgent care follow-up in which she was recently diagnosed with influenza.  Emily Wilson has Hyperthyroidism; Anxiety state; Breast cancer of lower-inner quadrant of right female breast ([place]); Diabetes mellitus type 2, uncontrolled, without complications ([place]); HLD (hyperlipidemia); and Family history of breast cancer in female on their problem list.   Influenza Patient presented to [facility] urgent care on 02/28/2018 with a cough and a gradually worsening fever.  She was subsequently diagnosed with influenza and started on Tamiflu.  She presents today for follow-up.  She complains that she is continuing to have chest tightness, shortness of breath, and a productive cough.  Last fever was 2 days ago.  She has continued to rest and hydrate.  Endorses a poor appetite.  Concern for a pneumonia.  Patient is a former smoker although endorses exposure to secondhand smoke.   Untreated breast Cancer  She has a history of left inner quadrant breast cancer.  She was under the care of of [facility] Dr. Lindi Adie in 2017 however discontinued therapy after she lost her medical insurance.  She has had no follow-up since that time and is concerned that the cancer has worsened.  Requesting a referral in order to follow-up with oncology as she now has health insurance.  She denies any unintentional weight loss prior to influenza illness.  Current medications: Current Outpatient Medications:  .  ALPRAZolam (XANAX) 0.5 MG tablet, TAKE ONE (1) TABLET BY MOUTH DAILY AS NEEDED FOR AXNIETY, Disp: 30 tablet, Rfl: 0 .  Blood Glucose Monitoring Suppl (ONE TOUCH ULTRA 2) w/Device KIT, 1 Device by Does not apply route once., Disp: 1 each,  Rfl: 0 .  diclofenac (VOLTAREN) 75 MG EC tablet, Take 75 mg by mouth 2 (two) times daily., Disp: , Rfl:  .  glucose blood (ONE TOUCH TEST STRIPS) test strip, 1 each by Other route 3 (three) times daily., Disp: 100 each, Rfl: 12 .  Lancets (ONETOUCH ULTRASOFT) lancets, 1 each by Other route 3 (three) times daily., Disp: 100 each, Rfl: 12   Pertinent family medical history: family history includes Bladder Cancer (age of onset: 31) in her mother; Breast cancer in her paternal aunt; COPD in her maternal grandmother and maternal uncle; Cancer (age of onset: 23) in her father; Dementia in her maternal uncle; Diabetes in her mother and paternal grandmother; Emphysema in her maternal grandmother; Multiple sclerosis in her mother and paternal aunt; Other (age of onset: 49) in her mother; Stroke in her maternal grandfather.   Allergies  Allergen Reactions  . Codeine     hives  . Hydrocodone-Acetaminophen     hives  . Meloxicam Other (See Comments)    Abdominal pain   . Naproxen     Cant breath  . Propoxyphene N-Acetaminophen     hives  . Sulfonamide Derivatives     Hives   . Tramadol Other (See Comments)    Abdominal Pain    Social History   Socioeconomic History  . Marital status: Divorced    Spouse name: Not on file  . Number of children: Not on file  . Years of education: Not on file  . Highest education  level: Not on file  Occupational History  . Occupation: Investment banker, operational  . Financial resource strain: Not on file  . Food insecurity:    Worry: Not on file    Inability: Not on file  . Transportation needs:    Medical: Not on file    Non-medical: Not on file  Tobacco Use  . Smoking status: Former Smoker    Packs/day: 3.00    Years: 30.00    Pack years: 90.00    Types: Cigarettes    Last attempt to quit: 10/24/2011    Years since quitting: 6.3  . Smokeless tobacco: Never Used  Substance and Sexual Activity  . Alcohol use: No  . Drug use: Yes  . Sexual activity:  Not Currently  Lifestyle  . Physical activity:    Days per week: Not on file    Minutes per session: Not on file  . Stress: Not on file  Relationships  . Social connections:    Talks on phone: Not on file    Gets together: Not on file    Attends religious service: Not on file    Active member of club or organization: Not on file    Attends meetings of clubs or organizations: Not on file    Relationship status: Not on file  . Intimate partner violence:    Fear of current or ex partner: Not on file    Emotionally abused: Not on file    Physically abused: Not on file    Forced sexual activity: Not on file  Other Topics Concern  . Not on file  Social History Narrative   In [place] w/boyfriend   Works [employer], Scientist, water quality    Review of Systems: Pertinent negatives listed in HPI  Objective:   Vitals:   03/08/18 1102  BP: 131/83  Pulse: 77  Resp: 18  Temp: 98 F (36.7 C)  SpO2: 97%    BP Readings from Last 3 Encounters:  03/08/18 131/83  02/28/18 (!) 150/82  07/01/17 124/84    Filed Weights   03/08/18 1102  Weight: 153 lb 6.4 oz (69.6 kg)  Physical Exam  Constitutional: She is oriented to person, place, and time.  Appears ill  HENT:  Head: Normocephalic.  Nose: Mucosal edema and rhinorrhea present.  Mouth/Throat: Posterior oropharyngeal erythema present.  Eyes: Pupils are equal, round, and reactive to light. Conjunctivae and EOM are normal.  Neck: Normal range of motion. Neck supple.  Cardiovascular: Normal rate and regular rhythm.  Pulmonary/Chest: She has wheezes.  Persistent hacking cough noted during exam  Lymphadenopathy:    She has cervical adenopathy.  Neurological: She is alert and oriented to person, place, and time.  Psychiatric: Her mood appears anxious.  Lab Results (prior encounters)  Lab Results  Component Value Date   WBC 8.2 11/04/2015   HGB 16.0 (H) 02/28/2018   HCT 47.0 (H) 02/28/2018   MCV 92.7 11/04/2015   PLT 382.0 11/04/2015   Lab  Results  Component Value Date   CREATININE 0.60 02/28/2018   BUN 6 02/28/2018   NA 134 (L) 02/28/2018   K 4.4 02/28/2018   CL 98 02/28/2018   CO2 28 11/04/2015    Lab Results  Component Value Date   HGBA1C 8.0 06/15/2017       Component Value Date/Time   CHOL 299 (H) 11/04/2015 1045   TRIG 263.0 (H) 11/04/2015 1045   HDL 49.40 11/04/2015 1045   CHOLHDL 6 11/04/2015 1045   VLDL 52.6 (H)  11/04/2015 1045        Assessment and plan:  1. Cough 2. Chest tightness 3. SOB (shortness of breath) Chest x-ray negative of pneumonia.  Will continue symptomatic treatment for now.  Patient advised to follow-up if symptoms worsen or do not improve.  4. Malignant neoplasm of lower-inner quadrant of right female breast, unspecified estrogen receptor status ([place]) Patient lost to follow-up after losing health insurance. Referral placed to have patient follow-up with Dr. Lindi Adie to resume breast cancer treatment as patient is now insured.  - Ambulatory referral to Oncology   Meds ordered this encounter  Medications  . cetirizine (ZYRTEC) 10 MG tablet    Sig: Take 1 tablet (10 mg total) by mouth daily.    Dispense:  30 tablet    Refill:  11  . benzonatate (TESSALON) 100 MG capsule    Sig: Take 1-2 capsules (100-200 mg total) by mouth 3 (three) times daily as needed for cough.    Dispense:  60 capsule    Refill:  0  . albuterol (PROVENTIL HFA;VENTOLIN HFA) 108 (90 Base) MCG/ACT inhaler    Sig: Inhale 2 puffs into the lungs every 6 (six) hours as needed for wheezing or shortness of breath.    Dispense:  1 Inhaler    Refill:  0  . ipratropium (ATROVENT) 0.03 % nasal spray    Sig: Place 2 sprays into both nostrils 3 (three) times daily.    Dispense:  30 mL    Refill:  0    Return in about 1 week (around 03/15/2018) for Diabetes and chronic conditon -fasting labs 7-10 needs an AM appointment  .   The patient was given clear instructions to go to ER or return to medical center if symptoms  don't improve, worsen or new problems develop. The patient verbalized understanding. The patient was advised  to call and obtain lab results if they haven't heard anything from out office within 7-10 business days.  Molli Barrows, FNP Primary Care at [facility] [address] [place] [address] [phone]: [phone]   This note has been created with Dragon speech recognition software and s[employer]er, materials Any transcriptional errors are unintentional.

## 2018-03-15 ENCOUNTER — Ambulatory Visit: Payer: Medicaid Other | Admitting: Internal Medicine

## 2018-03-15 ENCOUNTER — Encounter: Payer: Self-pay | Admitting: Internal Medicine

## 2018-03-15 VITALS — BP 118/76 | HR 93 | Ht <= 58 in | Wt 152.0 lb

## 2018-03-15 DIAGNOSIS — I471 Supraventricular tachycardia: Secondary | ICD-10-CM

## 2018-03-15 MED ORDER — METOPROLOL SUCCINATE ER 25 MG PO TB24: 25.0000 mg | ORAL_TABLET | Freq: Every day | ORAL | 3 refills | Status: DC

## 2018-03-15 NOTE — Patient Instructions (Addendum)
Medication Instructions:  Your physician has recommended you make the following change in your medication:   1.  Start taking Toprol XL 25 mg--Take one tablet by mouth daily.  Labwork: None ordered.  Testing/Procedures: None ordered.  Follow-Up: Your physician wants you to follow-up in: one year with Dr. Lovena Le.   You will receive a reminder letter in the mail two months in advance. If you don't receive a letter, please call our office to schedule the follow-up appointment.  Any Other Special Instructions Will Be Listed Below (If Applicable).  If you need a refill on your cardiac medications before your next appointment, please call your pharmacy.   Metoprolol extended-release tablets What is this medicine? METOPROLOL (me TOE proe lole) is a beta-blocker. Beta-blockers reduce the workload on the heart and help it to beat more regularly. This medicine is used to treat high blood pressure and to prevent chest pain. It is also used to after a heart attack and to prevent an additional heart attack from occurring. This medicine may be used for other purposes; ask your health care provider or pharmacist if you have questions. COMMON BRAND NAME(S): toprol, Toprol XL What should I tell my health care provider before I take this medicine? They need to know if you have any of these conditions: -diabetes -heart or vessel disease like slow heart rate, worsening heart failure, heart block, sick sinus syndrome or Raynaud's disease -kidney disease -liver disease -lung or breathing disease, like asthma or emphysema -pheochromocytoma -thyroid disease -an unusual or allergic reaction to metoprolol, other beta-blockers, medicines, foods, dyes, or preservatives -pregnant or trying to get pregnant -breast-feeding How should I use this medicine? Take this medicine by mouth with a glass of water. Follow the directions on the prescription label. Do not crush or chew. Take this medicine with or  immediately after meals. Take your doses at regular intervals. Do not take more medicine than directed. Do not stop taking this medicine suddenly. This could lead to serious heart-related effects. Talk to your pediatrician regarding the use of this medicine in children. While this drug may be prescribed for children as young as 6 years for selected conditions, precautions do apply. Overdosage: If you think you have taken too much of this medicine contact a poison control center or emergency room at once. NOTE: This medicine is only for you. Do not share this medicine with others. What if I miss a dose? If you miss a dose, take it as soon as you can. If it is almost time for your next dose, take only that dose. Do not take double or extra doses. What may interact with this medicine? This medicine may interact with the following medications: -certain medicines for blood pressure, heart disease, irregular heart beat -certain medicines for depression, like monoamine oxidase (MAO) inhibitors, fluoxetine, or paroxetine -clonidine -dobutamine -epinephrine -isoproterenol -reserpine This list may not describe all possible interactions. Give your health care provider a list of all the medicines, herbs, non-prescription drugs, or dietary supplements you use. Also tell them if you smoke, drink alcohol, or use illegal drugs. Some items may interact with your medicine. What should I watch for while using this medicine? Visit your doctor or health care professional for regular check ups. Contact your doctor right away if your symptoms worsen. Check your blood pressure and pulse rate regularly. Ask your health care professional what your blood pressure and pulse rate should be, and when you should contact them. You may get drowsy or dizzy. Do not  drive, use machinery, or do anything that needs mental alertness until you know how this medicine affects you. Do not sit or stand up quickly, especially if you are an  older patient. This reduces the risk of dizzy or fainting spells. Contact your doctor if these symptoms continue. Alcohol may interfere with the effect of this medicine. Avoid alcoholic drinks. What side effects may I notice from receiving this medicine? Side effects that you should report to your doctor or health care professional as soon as possible: -allergic reactions like skin rash, itching or hives -cold or numb hands or feet -depression -difficulty breathing -faint -fever with sore throat -irregular heartbeat, chest pain -rapid weight gain -swollen legs or ankles Side effects that usually do not require medical attention (report to your doctor or health care professional if they continue or are bothersome): -anxiety or nervousness -change in sex drive or performance -dry skin -headache -nightmares or trouble sleeping -short term memory loss -stomach upset or diarrhea -unusually tired This list may not describe all possible side effects. Call your doctor for medical advice about side effects. You may report side effects to FDA at 1-800-FDA-1088. Where should I keep my medicine? Keep out of the reach of children. Store at room temperature between 15 and 30 degrees C (59 and 86 degrees F). Throw away any unused medicine after the expiration date. NOTE: This sheet is a summary. It may not cover all possible information. If you have questions about this medicine, talk to your doctor, pharmacist, or health care provider.  2019 Elsevier/Gold Standard (2012-10-13 14:41:37)

## 2018-03-15 NOTE — Progress Notes (Signed)
    HPI Emily Wilson returns today after a long absence from our EP clinic. I met her over 20 years ago when she was pregnant and having episodes of SVT. Over the years her SVT has been fairly well controlled on beta blocker therapy. Her father died about 3 years ago and she inherited part of his estate and she lost her medicaid. She has been out of her meds. She has had worsening palpitations but not severe enough that she had to seek medical attention. She admits to dietary indiscretion and has gained weight. No syncope. She has a h/o non-cardiac chest pain.  Allergies  Allergen Reactions  . Codeine     hives  . Hydrocodone-Acetaminophen     hives  . Meloxicam Other (See Comments)    Abdominal pain   . Naproxen     Cant breath  . Propoxyphene N-Acetaminophen     hives  . Sulfonamide Derivatives     Hives   . Tramadol Other (See Comments)    Abdominal Pain     Current Outpatient Medications  Medication Sig Dispense Refill  . albuterol (PROVENTIL HFA;VENTOLIN HFA) 108 (90 Base) MCG/ACT inhaler Inhale 2 puffs into the lungs every 6 (six) hours as needed for wheezing or shortness of breath. 1 Inhaler 0  . ALPRAZolam (XANAX) 0.5 MG tablet TAKE ONE (1) TABLET BY MOUTH DAILY AS NEEDED FOR AXNIETY 30 tablet 0  . Blood Glucose Monitoring Suppl (ONE TOUCH ULTRA 2) w/Device KIT 1 Device by Does not apply route once. 1 each 0  . glucose blood (ONE TOUCH TEST STRIPS) test strip 1 each by Other route 3 (three) times daily. 100 each 12  . ipratropium (ATROVENT) 0.03 % nasal spray Place 2 sprays into both nostrils 3 (three) times daily. 30 mL 0  . Lancets (ONETOUCH ULTRASOFT) lancets 1 each by Other route 3 (three) times daily. 100 each 12  . cetirizine (ZYRTEC) 10 MG tablet Take 1 tablet (10 mg total) by mouth daily. (Patient not taking: Reported on 03/15/2018) 30 tablet 11  . metoprolol succinate (TOPROL XL) 25 MG 24 hr tablet Take 1 tablet (25 mg total) by mouth daily. 90 tablet 3   No  current facility-administered medications for this visit.      Past Medical History:  Diagnosis Date  . Anxiety state, unspecified   . Breast cancer of lower-inner quadrant of right female breast ([facility])   . Heart murmur   . Hyperlipidemia   . Hyperthyroidism   . Smoker   . 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   Past Surgical History:  Procedure Laterality Date  . ABDOMINAL HYSTERECTOMY    . APPENDECTOMY    . TUBAL LIGATION       Family History  Problem Relation Age of Onset  . Diabetes Mother   . Bladder Cancer Mother 66       smoker  . Other Mother 24       TAH for unspecified reason  . Multiple sclerosis Mother   . Cancer Father 53       lymphatic/tonsil cancer - in remission; former smoker  . COPD Maternal Grandmother        smoker  . Emphysema Maternal Grandmother        smoker  . Stroke Maternal Grandfather   . Dementia Maternal Uncle   .   Diabetes Paternal Grandmother   . COPD Maternal Uncle        smoker  . Multiple sclerosis Paternal Aunt   . Breast cancer Paternal Aunt        dx. late 54s - early 24s     Social History   Socioeconomic History  . Marital status: Divorced    Spouse name: Not on file  . Number of children: Not on file  . Years of education: Not on file  . Highest education level: Not on file  Occupational History  . Occupation: Investment banker, operational  . Financial resource strain: Not on file  . Food insecurity:    Worry: Not on file    Inability: Not on file  . Transportation needs:    Medical: Not on file    Non-medical: Not on file  Tobacco Use  . Smoking status: Former Smoker    Packs/day: 3.00    Years: 30.00    Pack years: 90.00    Types: Cigarettes    Last attempt to quit: 10/24/2011    Years since quitting: 6.3  . Smokeless tobacco: Never Used  Substance and Sexual Activity    . Alcohol use: No  . Drug use: Yes  . Sexual activity: Not Currently  Lifestyle  . Physical activity:    Days per week: Not on file    Minutes per session: Not on file  . Stress: Not on file  Relationships  . Social connections:    Talks on phone: Not on file    Gets together: Not on file    Attends religious service: Not on file    Active member of club or organization: Not on file    Attends meetings of clubs or organizations: Not on file    Relationship status: Not on file  . Intimate partner violence:    Fear of current or ex partner: Not on file    Emotionally abused: Not on file    Physically abused: Not on file    Forced sexual activity: Not on file  Other Topics Concern  . Not on file  Social History Narrative   In LTR w/boyfriend   Works [employer], Scientist, water quality     BP 118/76   Pulse 93   Ht 4' 10" (1.473 m)   Wt 152 lb (68.9 kg)   SpO2 98%   BMI 31.77 kg/m   Physical Exam:  Well appearing overweight 53 yo woman, NAD HEENT: Unremarkable Neck:  No JVD, no thyromegally Lymphatics:  No adenopathy Back:  No CVA tenderness Lungs:  Clear with no wheezes HEART:  Regular rate rhythm, no murmurs, no rubs, no clicks Abd:  soft, positive bowel sounds, no organomegally, no rebound, no guarding Ext:  2 plus pulses, no edema, no cyanosis, no clubbing Skin:  No rashes no nodules Neuro:  CN II through XII intact, motor grossly intact  EKG - nsr  Assess/Plan: 1. Palpitations - her SVT has been fairly well controlled. Ihave recommended she start back on toprol and she will get a prescription today. 2. HTN - her blood pressure is good today. It has been a little on the high side and we will treat with her beta blocker 3. Obesity - she is encouraged to lose weight.  Mikle Bosworth.D.

## 2018-03-16 ENCOUNTER — Ambulatory Visit (INDEPENDENT_AMBULATORY_CARE_PROVIDER_SITE_OTHER): Payer: Medicaid Other | Admitting: Family Medicine

## 2018-03-16 ENCOUNTER — Encounter: Payer: Self-pay | Admitting: Family Medicine

## 2018-03-16 VITALS — BP 126/87 | HR 89 | Resp 17 | Ht 59.0 in | Wt 151.6 lb

## 2018-03-16 DIAGNOSIS — C50311 Malignant neoplasm of lower-inner quadrant of right female breast: Secondary | ICD-10-CM | POA: Diagnosis not present

## 2018-03-16 DIAGNOSIS — Z1329 Encounter for screening for other suspected endocrine disorder: Secondary | ICD-10-CM

## 2018-03-16 DIAGNOSIS — Z23 Encounter for immunization: Secondary | ICD-10-CM | POA: Diagnosis not present

## 2018-03-16 DIAGNOSIS — Z130 Encounter for screening for diseases of the blood and blood-forming organs and certain disorders involving the immune mechanism: Secondary | ICD-10-CM

## 2018-03-16 DIAGNOSIS — E1165 Type 2 diabetes mellitus with hyperglycemia: Secondary | ICD-10-CM

## 2018-03-16 DIAGNOSIS — E119 Type 2 diabetes mellitus without complications: Secondary | ICD-10-CM

## 2018-03-16 DIAGNOSIS — E782 Mixed hyperlipidemia: Secondary | ICD-10-CM | POA: Diagnosis not present

## 2018-03-16 DIAGNOSIS — Z1389 Encounter for screening for other disorder: Secondary | ICD-10-CM

## 2018-03-16 LAB — GLUCOSE, POCT (MANUAL RESULT ENTRY): POC Glucose: 182 mg/dl — AB (ref 70–99)

## 2018-03-16 MED ORDER — GLUCOSE BLOOD VI STRP: 1.0000 | ORAL_STRIP | Freq: Three times a day (TID) | 12 refills | Status: DC

## 2018-03-16 MED ORDER — INSULIN GLARGINE 100 UNIT/ML SOLOSTAR PEN: 10.0000 [IU] | PEN_INJECTOR | Freq: Every day | SUBCUTANEOUS | 3 refills | Status: DC

## 2018-03-16 MED ORDER — EMPAGLIFLOZIN 10 MG PO TABS: 10.0000 mg | ORAL_TABLET | Freq: Every day | ORAL | 3 refills | Status: DC

## 2018-03-16 MED ORDER — ONETOUCH ULTRASOFT LANCETS MISC: 1.0000 | Freq: Three times a day (TID) | 12 refills | Status: DC

## 2018-03-16 MED ORDER — INSULIN PEN NEEDLE 30G X 8 MM MISC: 10.0000 | 12 refills | Status: DC | PRN

## 2018-03-16 NOTE — Progress Notes (Signed)
Established Patient Office Visit  Subjective:  Patient ID: Emily Wilson, female    DOB: 1966-02-12  Age: 53 y.o. MRN: [id number]  CC:  Chief Complaint  Patient presents with  . Diabetes  . Hyperlipidemia    HPI Emily Wilson presents for evaluation of diabetes and hyperlipidemia.   Diabetes  Emily Wilson doesn't monitor glucose at home. Is currently not taking any medication for diabetes as she has been without insurance for the last several years. Last documented A1C 8.0, 8 months ago. She endorses urinary frequency and neuropathic pain in distal digits. Glucose today 182 fasting. She has a history of hyperlipidemia. Not currently taking statin therapy. She endorses an unrestricted diet although makes efforts to make healthier choices. She endorses no routine physical exercise.   Oncology Referral  Patient has a history of right inner quadrant breast cancer. During her last office visit, she was referred back to oncology. As of today, she has not been contacted to schedule an appointment. Past Medical History:  Diagnosis Date  . Anxiety state, unspecified   . Breast cancer of lower-inner quadrant of right female breast ([place])   . Heart murmur   . Hyperlipidemia   . Hyperthyroidism   . Smoker   . 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 ABDOMINAL HYSTERECTOMY    . APPENDECTOMY    . TUBAL LIGATION      Family History  Problem Relation Age of Onset  . Diabetes Mother   . Bladder Cancer Mother 15       smoker  . Other Mother 38       TAH for unspecified reason  . Multiple sclerosis Mother   . Cancer Father 42       lymphatic/tonsil cancer - in remission; former smoker  . COPD Maternal Grandmother        smoker  . Emphysema Maternal Grandmother        smoker  . Stroke Maternal Grandfather   . Dementia Maternal Uncle   . Diabetes  Paternal Grandmother   . COPD Maternal Uncle        smoker  . Multiple sclerosis Paternal Aunt   . Breast cancer Paternal Aunt        dx. late 26s - early 61s    Social History   Socioeconomic History  . Marital status: Divorced    Spouse name: Not on file  . Number of children: Not on file  . Years of education: Not on file  . Highest education level: Not on file  Occupational History  . Occupation: Investment banker, operational  . Financial resource strain: Not on file  . Food insecurity:    Worry: Not on file    Inability: Not on file  . Transportation needs:    Medical: Not on file    Non-medical: Not on file  Tobacco Use  . Smoking status: Former Smoker    Packs/day: 3.00    Years: 30.00    Pack years: 90.00    Types: Cigarettes    Last attempt to quit: 10/24/2011    Years since quitting: 6.3  . Smokeless tobacco: Never Used  Substance and Sexual Activity  . Alcohol use: No  . Drug use: Yes  . Sexual activity: Not Currently  Lifestyle  . Physical activity:    Days per week: Not on file  Minutes per session: Not on file  . Stress: Not on file  Relationships  . Social connections:    Talks on phone: Not on file    Gets together: Not on file    Attends religious service: Not on file    Active member of club or organization: Not on file    Attends meetings of clubs or organizations: Not on file    Relationship status: Not on file  . Intimate partner violence:    Fear of current or ex partner: Not on file    Emotionally abused: Not on file    Physically abused: Not on file    Forced sexual activity: Not on file  Other Topics Concern  . Not on file  Social History Narrative   In [place] w/boyfriend   Works [employer], Scientist, water quality    Outpatient Medications Prior to Visit  Medication Sig Dispense Refill  . albuterol (PROVENTIL HFA;VENTOLIN HFA) 108 (90 Base) MCG/ACT inhaler Inhale 2 puffs into the lungs every 6 (six) hours as needed for wheezing or shortness of breath.  1 Inhaler 0  . ALPRAZolam (XANAX) 0.5 MG tablet TAKE ONE (1) TABLET BY MOUTH DAILY AS NEEDED FOR AXNIETY 30 tablet 0  . Blood Glucose Monitoring Suppl (ONE TOUCH ULTRA 2) w/Device KIT 1 Device by Does not apply route once. 1 each 0  . glucose blood (ONE TOUCH TEST STRIPS) test strip 1 each by Other route 3 (three) times daily. 100 each 12  . ipratropium (ATROVENT) 0.03 % nasal spray Place 2 sprays into both nostrils 3 (three) times daily. 30 mL 0  . Lancets (ONETOUCH ULTRASOFT) lancets 1 each by Other route 3 (three) times daily. 100 each 12  . metoprolol succinate (TOPROL XL) 25 MG 24 hr tablet Take 1 tablet (25 mg total) by mouth daily. (Patient not taking: Reported on 03/16/2018) 90 tablet 3  . cetirizine (ZYRTEC) 10 MG tablet Take 1 tablet (10 mg total) by mouth daily. (Patient not taking: Reported on 03/15/2018) 30 tablet 11   No facility-administered medications prior to visit.     Allergies  Allergen Reactions  . Codeine     hives  . Hydrocodone-Acetaminophen     hives  . Meloxicam Other (See Comments)    Abdominal pain   . Naproxen     Cant breath  . Propoxyphene N-Acetaminophen     hives  . Sulfonamide Derivatives     Hives   . Tramadol Other (See Comments)    Abdominal Pain    ROS Review of Systems  Pertinent negatives listed in HPI   Objective:    Physical Exam BP 126/87   Pulse 89   Resp 17   Ht '4\' 11"'  (1.499 m)   Wt 151 lb 9.6 oz (68.8 kg)   SpO2 96%   BMI 30.62 kg/m    General appearance: alert, well developed, well nourished, cooperative and in no distress Head: Normocephalic, without obvious abnormality, atraumatic Respiratory: Respirations even and unlabored, normal respiratory rate Heart: Regular rate, rhythm. No murmur or gallops. Extremities: No gross deformities Skin: Skin color, texture, turgor normal. No rashes seen  Psych: Appropriate mood and affect. Neurologic: Mental status: Alert, oriented to person, place, and time, thought content  appropriate. Wt Readings from Last 3 Encounters:  03/16/18 151 lb 9.6 oz (68.8 kg)  03/15/18 152 lb (68.9 kg)  03/08/18 153 lb 6.4 oz (69.6 kg)     Health Maintenance Due  Topic Date Due  . URINE MICROALBUMIN  02/20/1976  .  HIV Screening  02/19/1981  . PAP SMEAR-Modifier  02/20/1987  . TETANUS/TDAP  07/27/2015  . COLONOSCOPY  02/20/2016  . OPHTHALMOLOGY EXAM  10/29/2016  . MAMMOGRAM  02/19/2017  . HEMOGLOBIN A1C  12/15/2017    There are no preventive care reminders to display for this patient.  Lab Results  Component Value Date   TSH 1.30 11/04/2015   Lab Results  Component Value Date   WBC 8.2 11/04/2015   HGB 16.0 (H) 02/28/2018   HCT 47.0 (H) 02/28/2018   MCV 92.7 11/04/2015   PLT 382.0 11/04/2015   Lab Results  Component Value Date   NA 134 (L) 02/28/2018   K 4.4 02/28/2018   CO2 28 11/04/2015   GLUCOSE 233 (H) 02/28/2018   BUN 6 02/28/2018   CREATININE 0.60 02/28/2018   BILITOT 0.7 11/04/2015   ALKPHOS 94 11/04/2015   AST 15 11/04/2015   ALT 20 11/04/2015   PROT 7.2 11/04/2015   ALBUMIN 4.2 11/04/2015   CALCIUM 9.4 11/04/2015   GFR 91.24 11/04/2015   Lab Results  Component Value Date   CHOL 299 (H) 11/04/2015   Lab Results  Component Value Date   HDL 49.40 11/04/2015   No results found for: [facility] Lab Results  Component Value Date   TRIG 263.0 (H) 11/04/2015   Lab Results  Component Value Date   CHOLHDL 6 11/04/2015   Lab Results  Component Value Date   HGBA1C 8.0 06/15/2017      Assessment & Plan:  1. Uncontrolled type 2 diabetes mellitus with hyperglycemia (HCC) Start Lantus 10 units once daily at bedtime. Patient has an intolerance to metformin Start Jardience 10 mg once daily. Aim for 30 minutes of exercise most days, with a goal of 150 minutes per week. -Glucose monitoring at minimal of twice daily and keep a log of readings. -Commit to medication adherence and self-adjustment as needed -increase foods containing whole grains  (one-half of grain intake). -saturated fat intake should be reduced -reduce intake of trans fat (lowers LDL cholesterol and increases HDL cholesterol) -Eat 4-5 small meals during the day to reduce the risk of becoming hungry. -Referral placed to diabetes education  Checking the following:  - Glucose (CBG) - Comprehensive metabolic panel - Hemoglobin A1c - Ambulatory referral to diabetic education  2. Mixed hyperlipidemia - Lipid Panel  3. Screening for deficiency anemia - CBC with Differential  4. Thyroid disorder screen - TSH  5. Screening for blood or protein in urine  6. Need for prophylactic vaccination against Streptococcus pneumoniae (pneumococcus) - Pneumococcal polysaccharide vaccine 23-valent greater than or equal to 2yo subcutaneous/IM  7. Need for Tdap vaccination - Tdap vaccine greater than or equal to 7yo IM  8.Malignant neoplasm of lower-inner quadrant of right female breast, unspecified estrogen receptor status ([place]) -Referred previously to oncology. Will follow-up on referral.   Meds ordered this encounter  Medications  . glucose blood (ONE TOUCH TEST STRIPS) test strip    Sig: 1 each by Other route 3 (three) times daily.    Dispense:  100 each    Refill:  12    Diagnosis E11.9  . Lancets (ONETOUCH ULTRASOFT) lancets    Sig: 1 each by Other route 3 (three) times daily.    Dispense:  100 each    Refill:  12    Diagnosis E11.9  . Insulin Glargine (LANTUS SOLOSTAR) 100 UNIT/ML Solostar Pen    Sig: Inject 10 Units into the skin at bedtime.    Dispense:  5 pen    Refill:  3  . empagliflozin (JARDIANCE) 10 MG TABS tablet    Sig: Take 10 mg by mouth daily.    Dispense:  30 tablet    Refill:  3  . DISCONTD: Insulin Pen Needle (NOVOFINE) 30G X 8 MM MISC    Sig: Inject 100 each into the skin as needed. E.11.9    Dispense:  100 each    Refill:  12    Follow-up: Return in about 2 weeks (around 03/30/2018) for Diabetes follow-up .    Molli Barrows, FNP

## 2018-03-16 NOTE — Patient Instructions (Addendum)
I am starting you today on Lantus 10 units daily at bedtime.  Remember to eat a protein rich snack with insulin administration such a slice of cold cut meat, a small piece of cheese, or half a teaspoon of peanut butter to avoid hypoglycemia.  Remember to check your blood sugar every morning fasting and at least 2 hours after dinner.   Will make adjustments to insulin if blood sugars continue to range greater than 200 after medication has taken its fullest effect.  I have also started today also on empaligliflozin (Jardiance) 10 mg.  Take this medication once daily.  Medication should not cause hypoglycemia although is known to increase frequency of urination and can reduce blood pressure.  Ensure you are hydrating well with water while taking medication.  You will be notified of any abnormal labs within 5-7 days of today's visit.   You will be contacted regarding scheduling diabetes education sessions.      Diabetes Mellitus and Nutrition, Adult When you have diabetes (diabetes mellitus), it is very important to have healthy eating habits because your blood sugar (glucose) levels are greatly affected by what you eat and drink. Eating healthy foods in the appropriate amounts, at about the same times every day, can help you:  Control your blood glucose.  Lower your risk of heart disease.  Improve your blood pressure.  Reach or maintain a healthy weight. Every person with diabetes is different, and each person has different needs for a meal plan. Your health care provider may recommend that you work with a diet and nutrition specialist (dietitian) to make a meal plan that is best for you. Your meal plan may vary depending on factors such as:  The calories you need.  The medicines you take.  Your weight.  Your blood glucose, blood pressure, and cholesterol levels.  Your activity level.  Other health conditions you have, such as heart or kidney disease. How do carbohydrates affect  me? Carbohydrates, also called carbs, affect your blood glucose level more than any other type of food. Eating carbs naturally raises the amount of glucose in your blood. Carb counting is a method for keeping track of how many carbs you eat. Counting carbs is important to keep your blood glucose at a healthy level, especially if you use insulin or take certain oral diabetes medicines. It is important to know how many carbs you can safely have in each meal. This is different for every person. Your dietitian can help you calculate how many carbs you should have at each meal and for each snack. Foods that contain carbs include:  Bread, cereal, rice, pasta, and crackers.  Potatoes and corn.  Peas, beans, and lentils.  Milk and yogurt.  Fruit and juice.  Desserts, such as cakes, cookies, ice cream, and candy. How does alcohol affect me? Alcohol can cause a sudden decrease in blood glucose (hypoglycemia), especially if you use insulin or take certain oral diabetes medicines. Hypoglycemia can be a life-threatening condition. Symptoms of hypoglycemia (sleepiness, dizziness, and confusion) are similar to symptoms of having too much alcohol. If your health care provider says that alcohol is safe for you, follow these guidelines:  Limit alcohol intake to no more than 1 drink per day for nonpregnant women and 2 drinks per day for men. One drink equals 12 oz of beer, 5 oz of wine, or 1 oz of hard liquor.  Do not drink on an empty stomach.  Keep yourself hydrated with water, diet soda, or unsweetened  iced tea.  Keep in mind that regular soda, juice, and other mixers may contain a lot of sugar and must be counted as carbs. What are tips for following this plan?  Reading food labels  Start by checking the serving size on the "Nutrition Facts" label of packaged foods and drinks. The amount of calories, carbs, fats, and other nutrients listed on the label is based on one serving of the item. Many items  contain more than one serving per package.  Check the total grams (g) of carbs in one serving. You can calculate the number of servings of carbs in one serving by dividing the total carbs by 15. For example, if a food has 30 g of total carbs, it would be equal to 2 servings of carbs.  Check the number of grams (g) of saturated and trans fats in one serving. Choose foods that have low or no amount of these fats.  Check the number of milligrams (mg) of salt (sodium) in one serving. Most people should limit total sodium intake to less than 2,300 mg per day.  Always check the nutrition information of foods labeled as "low-fat" or "nonfat". These foods may be higher in added sugar or refined carbs and should be avoided.  Talk to your dietitian to identify your daily goals for nutrients listed on the label. Shopping  Avoid buying canned, premade, or processed foods. These foods tend to be high in fat, sodium, and added sugar.  Shop around the outside edge of the grocery store. This includes fresh fruits and vegetables, bulk grains, fresh meats, and fresh dairy. Cooking  Use low-heat cooking methods, such as baking, instead of high-heat cooking methods like deep frying.  Cook using healthy oils, such as olive, canola, or sunflower oil.  Avoid cooking with butter, cream, or high-fat meats. Meal planning  Eat meals and snacks regularly, preferably at the same times every day. Avoid going long periods of time without eating.  Eat foods high in fiber, such as fresh fruits, vegetables, beans, and whole grains. Talk to your dietitian about how many servings of carbs you can eat at each meal.  Eat 4-6 ounces (oz) of lean protein each day, such as lean meat, chicken, fish, eggs, or tofu. One oz of lean protein is equal to: ? 1 oz of meat, chicken, or fish. ? 1 egg. ?  cup of tofu.  Eat some foods each day that contain healthy fats, such as avocado, nuts, seeds, and fish. Lifestyle  Check your  blood glucose regularly.  Exercise regularly as told by your health care provider. This may include: ? 150 minutes of moderate-intensity or vigorous-intensity exercise each week. This could be brisk walking, biking, or water aerobics. ? Stretching and doing strength exercises, such as yoga or weightlifting, at least 2 times a week.  Take medicines as told by your health care provider.  Do not use any products that contain nicotine or tobacco, such as cigarettes and e-cigarettes. If you need help quitting, ask your health care provider.  Work with a Social worker or diabetes educator to identify strategies to manage stress and any emotional and social challenges. Questions to ask a health care provider  Do I need to meet with a diabetes educator?  Do I need to meet with a dietitian?  What number can I call if I have questions?  When are the best times to check my blood glucose? Where to find more information:  American Diabetes Association: diabetes.org  Academy of  Nutrition and Dietetics: www.eatright.[employer] of Diabetes and Digestive and Kidney Diseases (NIH): [employer] Summary  A healthy meal plan will help you control your blood glucose and maintain a healthy lifestyle.  Working with a diet and nutrition specialist (dietitian) can help you make a meal plan that is best for you.  Keep in mind that carbohydrates (carbs) and alcohol have immediate effects on your blood glucose levels. It is important to count carbs and to use alcohol carefully. This information is not intended to replace advice given to you by your health care provider. Make sure you discuss any questions you have with your health care provider. Document Released: 11/05/2004 Document Revised: 09/08/2016 Document Reviewed: 03/15/2016 Elsevier Interactive Patient Education  2019 [employer].

## 2018-03-17 ENCOUNTER — Ambulatory Visit: Payer: Medicaid Other

## 2018-03-17 ENCOUNTER — Other Ambulatory Visit: Payer: Self-pay

## 2018-03-17 LAB — COMPREHENSIVE METABOLIC PANEL
ALT: 27 IU/L (ref 0–32)
AST: 20 IU/L (ref 0–40)
Albumin/Globulin Ratio: 1.4 (ref 1.2–2.2)
Albumin: 3.9 g/dL (ref 3.8–4.9)
Alkaline Phosphatase: 128 IU/L — ABNORMAL HIGH (ref 39–117)
BUN/Creatinine Ratio: 12 (ref 9–23)
BUN: 9 mg/dL (ref 6–24)
Bilirubin Total: 0.7 mg/dL (ref 0.0–1.2)
CO2: 21 mmol/L (ref 20–29)
Calcium: 9.9 mg/dL (ref 8.7–10.2)
Chloride: 97 mmol/L (ref 96–106)
Creatinine, Ser: 0.73 mg/dL (ref 0.57–1.00)
GFR calc Af Amer: 110 mL/min/{1.73_m2} (ref >59)
GFR calc non Af Amer: 95 mL/min/{1.73_m2} (ref >59)
Globulin, Total: 2.8 g/dL (ref 1.5–4.5)
Glucose: 140 mg/dL — ABNORMAL HIGH (ref 65–99)
Potassium: 4.3 mmol/L (ref 3.5–5.2)
Sodium: 135 mmol/L (ref 134–144)
Total Protein: 6.7 g/dL (ref 6.0–8.5)

## 2018-03-17 LAB — CBC WITH DIFFERENTIAL/PLATELET
Basophils Absolute: 0.1 10*3/uL (ref 0.0–0.2)
Basos: 1 %
EOS (ABSOLUTE): 0.2 10*3/uL (ref 0.0–0.4)
Eos: 3 %
Hematocrit: 44.9 % (ref 34.0–46.6)
Hemoglobin: 15.4 g/dL (ref 11.1–15.9)
Immature Grans (Abs): 0 10*3/uL (ref 0.0–0.1)
Immature Granulocytes: 0 %
Lymphocytes Absolute: 2.8 10*3/uL (ref 0.7–3.1)
Lymphs: 44 %
MCH: 31 pg (ref 26.6–33.0)
MCHC: 34.3 g/dL (ref 31.5–35.7)
MCV: 90 fL (ref 79–97)
Monocytes Absolute: 0.8 10*3/uL (ref 0.1–0.9)
Monocytes: 12 %
Neutrophils Absolute: 2.6 10*3/uL (ref 1.4–7.0)
Neutrophils: 40 %
Platelets: 440 10*3/uL (ref 150–450)
RBC: 4.97 x10E6/uL (ref 3.77–5.28)
RDW: 12 % (ref 11.7–15.4)
WBC: 6.5 10*3/uL (ref 3.4–10.8)

## 2018-03-17 LAB — HEMOGLOBIN A1C
Est. average glucose Bld gHb Est-mCnc: 246 mg/dL
Hgb A1c MFr Bld: 10.2 % — ABNORMAL HIGH (ref 4.8–5.6)

## 2018-03-17 LAB — LIPID PANEL
Chol/HDL Ratio: 6.9 ratio — ABNORMAL HIGH (ref 0.0–4.4)
Cholesterol, Total: 297 mg/dL — ABNORMAL HIGH (ref 100–199)
HDL: 43 mg/dL (ref >39)
LDL Calculated: 190 mg/dL — ABNORMAL HIGH (ref 0–99)
Triglycerides: 318 mg/dL — ABNORMAL HIGH (ref 0–149)
VLDL Cholesterol Cal: 64 mg/dL — ABNORMAL HIGH (ref 5–40)

## 2018-03-17 LAB — TSH: TSH: 0.381 u[IU]/mL — ABNORMAL LOW (ref 0.450–4.500)

## 2018-03-17 MED ORDER — INSULIN PEN NEEDLE 30G X 8 MM MISC: 3 refills | Status: DC

## 2018-03-21 ENCOUNTER — Telehealth: Payer: Self-pay | Admitting: Family Medicine

## 2018-03-21 DIAGNOSIS — R221 Localized swelling, mass and lump, neck: Secondary | ICD-10-CM

## 2018-03-21 DIAGNOSIS — R7989 Other specified abnormal findings of blood chemistry: Secondary | ICD-10-CM

## 2018-03-21 MED ORDER — ATORVASTATIN CALCIUM 20 MG PO TABS: 20.0000 mg | ORAL_TABLET | Freq: Every day | ORAL | 3 refills | Status: DC

## 2018-03-21 NOTE — Telephone Encounter (Signed)
Contact patient to advise of A1C 10.2, goal <7. Continue current medication regimen prescribed during office visit.  I will recheck A1C in 1 month.   Thyroid function was abnormal and indicated overactivity. I will repeat thyroid function in 1 month and I would like to order an thyroid ultrasound. Order placed, please schedule.  Cholesterol panel was significantly abnormal-total cholesterol 297, LDL (bad cholesterol) 190, and triglycerides 318 I recommend starting statin therapy, I am prescribing Atorvastatin 20 mg once daily at 6 pm.   Schedule lab visit 04/13/18 and patient should keep 03/30/18 appointment

## 2018-03-21 NOTE — Telephone Encounter (Signed)
Emily Wilson,  Could you please follow-up on oncology referral? Patient was seen in office last week and she had received a call regarding referral. Referral is marked as close

## 2018-03-22 NOTE — Telephone Encounter (Signed)
Left voice mail to call back 

## 2018-03-22 NOTE — Telephone Encounter (Signed)
Gm  I sent a message to Seth Bake in Oncology  Waiting for her respond

## 2018-03-23 ENCOUNTER — Telehealth: Payer: Self-pay | Admitting: Hematology and Oncology

## 2018-03-23 NOTE — Telephone Encounter (Signed)
Called per 1/29 sch message - unable to reach pt and no vmail. scheduled appt and sent reminder letter in the mail.

## 2018-03-24 ENCOUNTER — Telehealth: Payer: Self-pay

## 2018-03-24 MED ORDER — BLOOD GLUCOSE MONITOR KIT: PACK | 0 refills | Status: DC

## 2018-03-24 NOTE — Telephone Encounter (Signed)
Pharmacy is requesting that patient's meter & testing supplies be changed to a brand that Medicaid covers. Medicaid's preferred glucometers are AccuChek Aviva, AccuCheck Aviva Plus, AccuChek Guide.

## 2018-03-27 NOTE — Telephone Encounter (Signed)
Left voice mail to call back 

## 2018-03-28 NOTE — Telephone Encounter (Signed)
Left voicemail to call back. Will mail "unable to contact" letter.

## 2018-03-29 ENCOUNTER — Encounter: Payer: Self-pay | Admitting: Hematology and Oncology

## 2018-03-29 ENCOUNTER — Telehealth: Payer: Self-pay

## 2018-03-29 NOTE — Telephone Encounter (Signed)
Pt called to cancel appt due not having insurance at this time. Pt tyring to obtain new insurance but will not have anything issued until March 2020. Will send message to scheduling to make appt for pt in March 2020.

## 2018-03-29 NOTE — Progress Notes (Signed)
After speaking with patient and confirming mailing address, mailed patient application for [facility] FAA to see if she qualifies for more than the 55% discount.  Enclosed my card for any additional financial questions or concerns.

## 2018-03-29 NOTE — Progress Notes (Signed)
Patient left message regarding appointment and not having insurance.  Called patient back and advised all uninsured patients receive an automatic 55% discount for services billed through [facility]. She states her Medicaid terminated due to her son being over-aged. Advised she may apply for an additional discount by completing the [facility] FAA and supporting documents and send to customer service. Application will be reviewed by them and they will send her a determination letter via mail. Sent staff message to RN regarding other concerns as follows:    Hi May,   I have patient on the phone whom is stating she needs to cancel the appointment which she doesn't know whom scheduled it anyway. Audie Clear advised me to send you a message and you all would advise scheduling if the appointment should be canceled or moved.   She does not have insurance which we discussed her options but also hard a time getting her blood sugar medications which she is working on with the manufacturer and PCP to get this handled.   Are you able to speak with her now?   RN response:  RE: Scheduling conflict  Received: Today  Message Contents  [name] J, RN  [name] M        Yes I can call her. Her PCP referred her back to [facility] for her annual follow up. She can call to reschedule when she has medical insurance figured out, if she wants.   May,RN

## 2018-03-30 ENCOUNTER — Telehealth: Payer: Self-pay | Admitting: Hematology and Oncology

## 2018-03-30 ENCOUNTER — Ambulatory Visit: Payer: Medicaid Other | Admitting: Family Medicine

## 2018-03-30 NOTE — Telephone Encounter (Signed)
Scheduled appt per 2/5 sch message - pt is aware of appt date and time   

## 2018-03-31 ENCOUNTER — Inpatient Hospital Stay: Payer: Medicaid Other | Admitting: Hematology and Oncology

## 2018-04-05 MED ORDER — JARDIANCE 10 MG PO TABS: 10.0000 mg | ORAL_TABLET | Freq: Every day | ORAL | 3 refills | Status: DC

## 2018-04-05 NOTE — Telephone Encounter (Signed)
Patient called & notified of [place] appointment. Also let her know that the appeal was approved & she is able to start the Jardiance. She would like to call back to schedule the 1 month appointment.

## 2018-04-05 NOTE — Telephone Encounter (Signed)
Please file an appeal regarding medication. Patient is intolerant of Metformin. Has possible family hx of thyroid cancer and now has personal abnormal thyroid studies which makes her not a good candidate for Januvia.  Glimepiride or glipizide are not good options with her current insulin prescription as this increases the risk of hypoglycemia and patient admits to not routinely checking her blood sugar nor does she endorse a stable diet.  However in the meantime I would recommend increasing Lantus from 10 units once daily to 20 units once daily.  I still would like for her to keep her next scheduled follow-up.  We will work to get the [place] approved if not I may have to add glimepiride and educate her on the importance of checking her blood sugar more frequently.

## 2018-04-05 NOTE — Telephone Encounter (Addendum)
[place] for thyroid was approved.  Called to appeal the denial for the Jardiance. Chong Sicilian states that it was originally denied b/c of the lapse in insurance. PA isn't currently required as long as the insurance runs the claim as brand name.  Called pharmacy to notify them of this & was told that it was still showing a PA needed. Called [employer] back & spoke with Joelene Millin to do an appeal. PA appeal was approved from 04/05/2018-03/27/2019. Authorization # [id number].

## 2018-04-05 NOTE — Telephone Encounter (Signed)
Patient notified of lab results & recommendations. Expressed understanding. She states that she is having trouble getting the Jardiance. I called Medicaid to follow up on PA & was told that it was denied. Is there an alternate medication that she can be prescribed?  Will call to schedule thyroid ultrasound now that patient has been notified of results.

## 2018-04-05 NOTE — Telephone Encounter (Signed)
Thyroid ultrasound is scheduled for 04/18/2018 @ 11:15 AM at [facility].

## 2018-04-05 NOTE — Addendum Note (Signed)
Addended by: Carylon Perches on: 04/05/2018 05:09 PM   Modules accepted: Orders

## 2018-04-18 ENCOUNTER — Ambulatory Visit (HOSPITAL_COMMUNITY)
Admission: RE | Admit: 2018-04-18 | Discharge: 2018-04-18 | Disposition: A | Discharge status: Home / Self Care | Payer: Medicaid Other | Source: Ambulatory Visit | Attending: Family Medicine | Admitting: Family Medicine

## 2018-04-18 ENCOUNTER — Telehealth: Payer: Self-pay | Admitting: Family Medicine

## 2018-04-18 DIAGNOSIS — R7989 Other specified abnormal findings of blood chemistry: Secondary | ICD-10-CM

## 2018-04-18 DIAGNOSIS — R221 Localized swelling, mass and lump, neck: Secondary | ICD-10-CM | POA: Diagnosis not present

## 2018-04-18 DIAGNOSIS — E042 Nontoxic multinodular goiter: Secondary | ICD-10-CM | POA: Diagnosis not present

## 2018-04-18 NOTE — Telephone Encounter (Signed)
Notify patient recent Thyroid ultrasound revealed some mild enlargement and small nodules which are likely benign and warrant no additional imaging or follow-up. I will repeat thyroid functioning test at next follow-up visit

## 2018-04-19 ENCOUNTER — Ambulatory Visit (INDEPENDENT_AMBULATORY_CARE_PROVIDER_SITE_OTHER): Payer: Medicaid Other

## 2018-04-19 ENCOUNTER — Encounter (HOSPITAL_COMMUNITY): Payer: Self-pay | Admitting: Emergency Medicine

## 2018-04-19 ENCOUNTER — Ambulatory Visit (HOSPITAL_COMMUNITY)
Admission: EM | Admit: 2018-04-19 | Discharge: 2018-04-19 | Disposition: A | Discharge status: Home / Self Care | Payer: Medicaid Other | Attending: Family Medicine | Admitting: Family Medicine

## 2018-04-19 ENCOUNTER — Other Ambulatory Visit: Payer: Self-pay

## 2018-04-19 DIAGNOSIS — S92535A Nondisplaced fracture of distal phalanx of left lesser toe(s), initial encounter for closed fracture: Secondary | ICD-10-CM | POA: Diagnosis not present

## 2018-04-19 DIAGNOSIS — S99922A Unspecified injury of left foot, initial encounter: Secondary | ICD-10-CM

## 2018-04-19 DIAGNOSIS — M79675 Pain in left toe(s): Secondary | ICD-10-CM | POA: Diagnosis not present

## 2018-04-19 NOTE — ED Triage Notes (Signed)
Pt states she hung her left pinky toe up on a ladder.  Pt states she has cancer, diabetes and neuropathy and states she knows she needs to have a baseline for her injury for further treatment.

## 2018-04-20 NOTE — Telephone Encounter (Signed)
Patient notified of thyroid US results & recommendations. Expressed understanding.

## 2018-04-20 NOTE — ED Provider Notes (Signed)
[place]   [id number] 04/19/18 Arrival Time: 6629  ASSESSMENT & PLAN:  1. Foot injury, left, initial encounter   2. Toe injury, left, initial encounter   3. Closed nondisplaced fracture of distal phalanx of lesser toe of left foot, initial encounter    I have personally viewed the imaging studies ordered this visit. Subtle 5th toe fracture without dislocation.  Imaging: Dg Foot Complete Left  Result Date: 04/19/2018 CLINICAL DATA:  Trauma, pain over fifth toe and distal fifth metatarsal after stepping off a ladder wrong today EXAM: LEFT FOOT - COMPLETE 3+ VIEW COMPARISON:  None FINDINGS: Osseous mineralization normal. Minimal degenerative changes at first MTP joint. Remaining joint spaces preserved. Nondisplaced oblique fracture at base of fused middle/distal phalanx fifth toe. No additional fracture, dislocation, or bone destruction. Large plantar calcaneal spur. IMPRESSION: Nondisplaced oblique fracture at base of fused middle/distal phalanx LEFT fifth toe. Electronically Signed   By: Lavonia Dana M.D.   On: 04/19/2018 16:40   She prefers Tylenol. "Allergic to everything else."  Fitted with post-op shoe.  Follow-up Information    Scot Jun, FNP.   Specialty:  Family Medicine Why:  As needed. Contact information: [address] [place] [address] [phone]           OTC analgesics as needed. Written information on fracture and splint care given. To arrange orthopaedic follow up within one week. May f/u here as needed.  Reviewed expectations re: course of current medical issues. Questions answered. Outlined signs and symptoms indicating need for more acute intervention. Patient verbalized understanding. After Visit Summary given.  SUBJECTIVE: History from: patient. Emily Wilson is a 53 y.o. female who reports persistent pain of her left 5th toe and distal dorsal foot; with mild swelling. Onset abrupt beginning several hours ago.  Injury/trama: yes, reports being on stepladder and slipping "catching my little toe on the ladder". Discomfort described as aching without radiation. Symptoms have progressed to a point and plateaued since beginning. Able to bear weight immediately and since with some discomfort. Extremity sensation changes or weakness: none. Self treatment: has not tried OTCs for relief of pain.  ROS: As per HPI. All other systems negative.   OBJECTIVE:  Vitals:   04/19/18 1545  BP: 108/85  Pulse: 95  Temp: 98.4 F (36.9 C)  TempSrc: Temporal  SpO2: 100%    General appearance: alert; no distress HEENT: [place]; AT Extremities: . LLE: warm and well perfused; fairly well localized moderate tenderness over left 5th toe extending over distal 5th metatarsal; without gross deformities; with mild swelling; with mild bruising at base of 5th toe CV: brisk extremity capillary refill of LLE; 1+ DP and PT pulse of LLE Skin: warm and dry Neurologic: gait normal; normal symmetric reflexes of RLE and LLE; normal sensation of RLE and LLE Psychological: alert and cooperative; normal mood and affect   Allergies  Allergen Reactions  . Codeine     hives  . Hydrocodone-Acetaminophen     hives  . Meloxicam Other (See Comments)    Abdominal pain   . Naproxen     Cant breath  . Propoxyphene N-Acetaminophen     hives  . Sulfonamide Derivatives     Hives   . Tramadol Other (See Comments)    Abdominal Pain    Past Medical History:  Diagnosis Date  . Anxiety state, unspecified   . Breast cancer of lower-inner quadrant of right female breast ([place])   . Heart murmur   .  Hyperlipidemia   . Hyperthyroidism   . Smoker   . 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 Marital status: Divorced    Spouse name: Not on file  . Number of children: Not on file  . Years of education: Not on file  . Highest education  level: Not on file  Occupational History  . Occupation: Investment banker, operational  . Financial resource strain: Not on file  . Food insecurity:    Worry: Not on file    Inability: Not on file  . Transportation needs:    Medical: Not on file    Non-medical: Not on file  Tobacco Use  . Smoking status: Former Smoker    Packs/day: 3.00    Years: 30.00    Pack years: 90.00    Types: Cigarettes    Last attempt to quit: 10/24/2011    Years since quitting: 6.4  . Smokeless tobacco: Never Used  Substance and Sexual Activity  . Alcohol use: No  . Drug use: Yes  . Sexual activity: Not Currently  Lifestyle  . Physical activity:    Days per week: Not on file    Minutes per session: Not on file  . Stress: Not on file  Relationships  . Social connections:    Talks on phone: Not on file    Gets together: Not on file    Attends religious service: Not on file    Active member of club or organization: Not on file    Attends meetings of clubs or organizations: Not on file    Relationship status: Not on file  . Intimate partner violence:    Fear of current or ex partner: Not on file    Emotionally abused: Not on file    Physically abused: Not on file    Forced sexual activity: Not on file  Other Topics Concern  . Not on file  Social History Narrative   In LTR w/boyfriend   Works [employer], Scientist, water quality   Family History  Problem Relation Age of Onset  . Diabetes Mother   . Bladder Cancer Mother 12       smoker  . Other Mother 46       TAH for unspecified reason  . Multiple sclerosis Mother   . Cancer Father 69       lymphatic/tonsil cancer - in remission; former smoker  . COPD Maternal Grandmother        smoker  . Emphysema Maternal Grandmother        smoker  . Stroke Maternal Grandfather   . Dementia Maternal Uncle   . Diabetes Paternal Grandmother   . COPD Maternal Uncle        smoker  . Multiple sclerosis Paternal Aunt   . Breast cancer Paternal Aunt        dx. late 9s -  early 66s   Past Surgical History:  Procedure Laterality Date  . ABDOMINAL HYSTERECTOMY    . APPENDECTOMY    . TUBAL LIGATION        Vanessa Kick, MD 04/20/18 [phone]

## 2018-05-10 ENCOUNTER — Encounter: Payer: Self-pay | Admitting: Family Medicine

## 2018-05-10 ENCOUNTER — Other Ambulatory Visit: Payer: Self-pay

## 2018-05-10 ENCOUNTER — Ambulatory Visit (INDEPENDENT_AMBULATORY_CARE_PROVIDER_SITE_OTHER): Payer: Medicaid Other | Admitting: Family Medicine

## 2018-05-10 VITALS — BP 121/72 | HR 87 | Resp 17 | Ht 59.0 in | Wt 152.6 lb

## 2018-05-10 DIAGNOSIS — E785 Hyperlipidemia, unspecified: Secondary | ICD-10-CM

## 2018-05-10 DIAGNOSIS — R7989 Other specified abnormal findings of blood chemistry: Secondary | ICD-10-CM

## 2018-05-10 DIAGNOSIS — Z853 Personal history of malignant neoplasm of breast: Secondary | ICD-10-CM

## 2018-05-10 DIAGNOSIS — Z794 Long term (current) use of insulin: Secondary | ICD-10-CM | POA: Diagnosis not present

## 2018-05-10 DIAGNOSIS — E1159 Type 2 diabetes mellitus with other circulatory complications: Secondary | ICD-10-CM | POA: Diagnosis not present

## 2018-05-10 DIAGNOSIS — B379 Candidiasis, unspecified: Secondary | ICD-10-CM

## 2018-05-10 DIAGNOSIS — E1165 Type 2 diabetes mellitus with hyperglycemia: Secondary | ICD-10-CM

## 2018-05-10 DIAGNOSIS — C50311 Malignant neoplasm of lower-inner quadrant of right female breast: Secondary | ICD-10-CM

## 2018-05-10 LAB — POCT URINALYSIS DIP (CLINITEK)
Bilirubin, UA: NEGATIVE
Glucose, UA: >=1000 mg/dL — AB
Ketones, POC UA: NEGATIVE mg/dL
Leukocytes, UA: NEGATIVE
Nitrite, UA: NEGATIVE
POC PROTEIN,UA: NEGATIVE
Spec Grav, UA: 1.015 (ref 1.010–1.025)
Urobilinogen, UA: 0.2 E.U./dL
pH, UA: 5.5 (ref 5.0–8.0)

## 2018-05-10 LAB — GLUCOSE, POCT (MANUAL RESULT ENTRY): POC Glucose: 200 mg/dl — AB (ref 70–99)

## 2018-05-10 MED ORDER — INSULIN REGULAR HUMAN 100 UNIT/ML IJ SOLN: 10.0000 [IU] | Freq: Three times a day (TID) | INTRAMUSCULAR | 11 refills | Status: DC

## 2018-05-10 MED ORDER — INSULIN GLARGINE 100 UNIT/ML SOLOSTAR PEN: 20.0000 [IU] | PEN_INJECTOR | Freq: Every day | SUBCUTANEOUS | 3 refills | Status: DC

## 2018-05-10 MED ORDER — "SYRINGE 21G X 1-1/4"" 6 ML MISC": 11 refills | Status: DC

## 2018-05-10 MED ORDER — FLUCONAZOLE 150 MG PO TABS: 150.0000 mg | ORAL_TABLET | Freq: Once | ORAL | 0 refills | Status: AC

## 2018-05-10 NOTE — Patient Instructions (Signed)
As discussed discontinue Jardiance. Have increase Lantus to 20 units once daily. Have added Humulin R 10 units 3 times daily with meals. Hold short acting insulin if you do not eat or if blood sugar is 125 or less.    Diabetes Mellitus and Nutrition, Adult When you have diabetes (diabetes mellitus), it is very important to have healthy eating habits because your blood sugar (glucose) levels are greatly affected by what you eat and drink. Eating healthy foods in the appropriate amounts, at about the same times every day, can help you:  Control your blood glucose.  Lower your risk of heart disease.  Improve your blood pressure.  Reach or maintain a healthy weight. Every person with diabetes is different, and each person has different needs for a meal plan. Your health care provider may recommend that you work with a diet and nutrition specialist (dietitian) to make a meal plan that is best for you. Your meal plan may vary depending on factors such as:  The calories you need.  The medicines you take.  Your weight.  Your blood glucose, blood pressure, and cholesterol levels.  Your activity level.  Other health conditions you have, such as heart or kidney disease. How do carbohydrates affect me? Carbohydrates, also called carbs, affect your blood glucose level more than any other type of food. Eating carbs naturally raises the amount of glucose in your blood. Carb counting is a method for keeping track of how many carbs you eat. Counting carbs is important to keep your blood glucose at a healthy level, especially if you use insulin or take certain oral diabetes medicines. It is important to know how many carbs you can safely have in each meal. This is different for every person. Your dietitian can help you calculate how many carbs you should have at each meal and for each snack. Foods that contain carbs include:  Bread, cereal, rice, pasta, and crackers.  Potatoes and corn.  Peas,  beans, and lentils.  Milk and yogurt.  Fruit and juice.  Desserts, such as cakes, cookies, ice cream, and candy. How does alcohol affect me? Alcohol can cause a sudden decrease in blood glucose (hypoglycemia), especially if you use insulin or take certain oral diabetes medicines. Hypoglycemia can be a life-threatening condition. Symptoms of hypoglycemia (sleepiness, dizziness, and confusion) are similar to symptoms of having too much alcohol. If your health care provider says that alcohol is safe for you, follow these guidelines:  Limit alcohol intake to no more than 1 drink per day for nonpregnant women and 2 drinks per day for men. One drink equals 12 oz of beer, 5 oz of wine, or 1 oz of hard liquor.  Do not drink on an empty stomach.  Keep yourself hydrated with water, diet soda, or unsweetened iced tea.  Keep in mind that regular soda, juice, and other mixers may contain a lot of sugar and must be counted as carbs. What are tips for following this plan?  Reading food labels  Start by checking the serving size on the "Nutrition Facts" label of packaged foods and drinks. The amount of calories, carbs, fats, and other nutrients listed on the label is based on one serving of the item. Many items contain more than one serving per package.  Check the total grams (g) of carbs in one serving. You can calculate the number of servings of carbs in one serving by dividing the total carbs by 15. For example, if a food has 30 g  of total carbs, it would be equal to 2 servings of carbs.  Check the number of grams (g) of saturated and trans fats in one serving. Choose foods that have low or no amount of these fats.  Check the number of milligrams (mg) of salt (sodium) in one serving. Most people should limit total sodium intake to less than 2,300 mg per day.  Always check the nutrition information of foods labeled as "low-fat" or "nonfat". These foods may be higher in added sugar or refined carbs  and should be avoided.  Talk to your dietitian to identify your daily goals for nutrients listed on the label. Shopping  Avoid buying canned, premade, or processed foods. These foods tend to be high in fat, sodium, and added sugar.  Shop around the outside edge of the grocery store. This includes fresh fruits and vegetables, bulk grains, fresh meats, and fresh dairy. Cooking  Use low-heat cooking methods, such as baking, instead of high-heat cooking methods like deep frying.  Cook using healthy oils, such as olive, canola, or sunflower oil.  Avoid cooking with butter, cream, or high-fat meats. Meal planning  Eat meals and snacks regularly, preferably at the same times every day. Avoid going long periods of time without eating.  Eat foods high in fiber, such as fresh fruits, vegetables, beans, and whole grains. Talk to your dietitian about how many servings of carbs you can eat at each meal.  Eat 4-6 ounces (oz) of lean protein each day, such as lean meat, chicken, fish, eggs, or tofu. One oz of lean protein is equal to: ? 1 oz of meat, chicken, or fish. ? 1 egg. ?  cup of tofu.  Eat some foods each day that contain healthy fats, such as avocado, nuts, seeds, and fish. Lifestyle  Check your blood glucose regularly.  Exercise regularly as told by your health care provider. This may include: ? 150 minutes of moderate-intensity or vigorous-intensity exercise each week. This could be brisk walking, biking, or water aerobics. ? Stretching and doing strength exercises, such as yoga or weightlifting, at least 2 times a week.  Take medicines as told by your health care provider.  Do not use any products that contain nicotine or tobacco, such as cigarettes and e-cigarettes. If you need help quitting, ask your health care provider.  Work with a Social worker or diabetes educator to identify strategies to manage stress and any emotional and social challenges. Questions to ask a health care  provider  Do I need to meet with a diabetes educator?  Do I need to meet with a dietitian?  What number can I call if I have questions?  When are the best times to check my blood glucose? Where to find more information:  American Diabetes Association: diabetes.org  Academy of Nutrition and Dietetics: www.eatright.[employer] of Diabetes and Digestive and Kidney Diseases (NIH): [employer] Summary  A healthy meal plan will help you control your blood glucose and maintain a healthy lifestyle.  Working with a diet and nutrition specialist (dietitian) can help you make a meal plan that is best for you.  Keep in mind that carbohydrates (carbs) and alcohol have immediate effects on your blood glucose levels. It is important to count carbs and to use alcohol carefully. This information is not intended to replace advice given to you by your health care provider. Make sure you discuss any questions you have with your health care provider. Document Released: 11/05/2004 Document Revised: 09/08/2016 Document Reviewed: 03/15/2016  Chartered certified accountant Patient Education  [employer].

## 2018-05-10 NOTE — Progress Notes (Signed)
Patient ID: Emily Wilson, female    DOB: 1966-02-07, 53 y.o.   MRN: [id number]  PCP: Scot Jun, FNP  Chief Complaint  Patient presents with  . Diabetes  . Thyroid Problem    repeat thyroid function test     Subjective:  HPI  Emily Wilson is a 53 y.o. female with breast cancer, uncontrolled diabetes,  presents for follow-up.  Diabetes Yanna monitors glucose at home and reports her average readings 286. Current regimen Jardiance 10 mg once daily and Lantus 10 units daily. She complains of recurrent yeast infections since starting Jardiance.  Adheres to current medication regimen and continues to experience blood sugar readings > 200. Endorses urinary frequency. Last A1C 10.2 two months ago. Endorses frequent skipping of meals. Engages in no routine exercise. Current Body mass index is 30.82 kg/m. She is intolerant of metformin and reports multiple medication reactions therefore prefers to manage diabetes with insulin only.   Abnormal Thyroid function Patient's thyroid function was abnormally low 2 months prior. Ultrasound of thyroid gland was negative of any acute findings. Patient denies any symptoms of palpitations, temperature intolerance, or unintentional weight loss. Social History   Socioeconomic History  . Marital status: Divorced    Spouse name: Not on file  . Number of children: Not on file  . Years of education: Not on file  . Highest education level: Not on file  Occupational History  . Occupation: Investment banker, operational  . Financial resource strain: Not on file  . Food insecurity:    Worry: Not on file    Inability: Not on file  . Transportation needs:    Medical: Not on file    Non-medical: Not on file  Tobacco Use  . Smoking status: Former Smoker    Packs/day: 3.00    Years: 30.00    Pack years: 90.00    Types: Cigarettes    Last attempt to quit: 10/24/2011    Years since quitting: 6.5  . Smokeless tobacco: Never Used  Substance and Sexual  Activity  . Alcohol use: No  . Drug use: Yes  . Sexual activity: Not Currently  Lifestyle  . Physical activity:    Days per week: Not on file    Minutes per session: Not on file  . Stress: Not on file  Relationships  . Social connections:    Talks on phone: Not on file    Gets together: Not on file    Attends religious service: Not on file    Active member of club or organization: Not on file    Attends meetings of clubs or organizations: Not on file    Relationship status: Not on file  . Intimate partner violence:    Fear of current or ex partner: Not on file    Emotionally abused: Not on file    Physically abused: Not on file    Forced sexual activity: Not on file  Other Topics Concern  . 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 Diabetes Mother   . Bladder Cancer Mother 64       smoker  . Other Mother 63       TAH for unspecified reason  . Multiple sclerosis Mother   . Cancer Father 80       lymphatic/tonsil cancer - in remission; former smoker  . COPD Maternal Grandmother  smoker  . Emphysema Maternal Grandmother        smoker  . Stroke Maternal Grandfather   . Dementia Maternal Uncle   . Diabetes Paternal Grandmother   . COPD Maternal Uncle        smoker  . Multiple sclerosis Paternal Aunt   . Breast cancer Paternal Aunt        dx. late 66s - early 55s     Review of Systems Pertinent negatives listed in HPI Patient Active Problem List   Diagnosis Date Noted  . Family history of breast cancer in female 03/13/2015  . Breast cancer of lower-inner quadrant of right female breast ([place]) 02/25/2015  . Diabetes mellitus type 2, uncontrolled, without complications ([place]) 27/04/5007  . HLD (hyperlipidemia) 02/25/2015  . Anxiety state 05/26/2009  . Hyperthyroidism 11/14/2006    Allergies  Allergen Reactions  . Codeine     hives  .  Hydrocodone-Acetaminophen     hives  . Meloxicam Other (See Comments)    Abdominal pain   . Naproxen     Cant breath  . Propoxyphene N-Acetaminophen     hives  . Sulfonamide Derivatives     Hives   . Tramadol Other (See Comments)    Abdominal Pain    Prior to Admission medications   Medication Sig Start Date End Date Taking? Authorizing Provider  albuterol (PROVENTIL HFA;VENTOLIN HFA) 108 (90 Base) MCG/ACT inhaler Inhale 2 puffs into the lungs every 6 (six) hours as needed for wheezing or shortness of breath. 03/08/18   Scot Jun, FNP  ALPRAZolam Duanne Moron) 0.5 MG tablet TAKE ONE (1) TABLET BY MOUTH DAILY AS NEEDED FOR AXNIETY 06/15/17   Ria Bush, MD  atorvastatin (LIPITOR) 20 MG tablet Take 1 tablet (20 mg total) by mouth daily. 03/21/18   Scot Jun, FNP  blood glucose meter kit and supplies KIT Dispense based on patient preference and insurance coverage. Use up to four times daily as directed. (FOR ICD-9 250.00, 250.01). 03/24/18   Scot Jun, FNP  Blood Glucose Monitoring Suppl (ONE TOUCH ULTRA 2) w/Device KIT 1 Device by Does not apply route once. 02/26/15   Jearld Fenton, NP  diclofenac (VOLTAREN) 75 MG EC tablet TAKE 1 TABLET BY MOUTH TWICE A DAY WITH FOOD AS NEEDED 04/09/18   [provider]  glucose blood (ONE TOUCH TEST STRIPS) test strip 1 each by Other route 3 (three) times daily. 03/16/18   Scot Jun, FNP  Insulin Glargine (LANTUS SOLOSTAR) 100 UNIT/ML Solostar Pen Inject 10 Units into the skin at bedtime. 03/16/18   Scot Jun, FNP  Insulin Pen Needle (NOVOFINE) 30G X 8 MM MISC Use once at night to inject insulin. E.11.9 03/17/18   Scot Jun, FNP  ipratropium (ATROVENT) 0.03 % nasal spray Place 2 sprays into both nostrils 3 (three) times daily. 03/08/18   Scot Jun, FNP  JARDIANCE 10 MG TABS tablet Take 10 mg by mouth daily. 04/05/18   Scot Jun, FNP  Lancets [facility] ULTRASOFT) lancets 1 each by  Other route 3 (three) times daily. 03/16/18   Scot Jun, FNP  metoprolol succinate (TOPROL XL) 25 MG 24 hr tablet Take 1 tablet (25 mg total) by mouth daily. 03/15/18   [name], MD    Past Medical, Surgical Family and Social History reviewed and updated.    Objective:   Today's Vitals   05/10/18 1339  Height: '4\' 11"'  (1.499 m)    Wt Readings  from Last 3 Encounters:  03/16/18 151 lb 9.6 oz (68.8 kg)  03/15/18 152 lb (68.9 kg)  03/08/18 153 lb 6.4 oz (69.6 kg)     Physical Exam General appearance: alert, well developed, well nourished, cooperative and in no distress Head: Normocephalic, without obvious abnormality, atraumatic Respiratory: Respirations even and unlabored, normal respiratory rate Heart: rate and rhythm normal. No gallop or murmurs noted on exam  Abdomen: BS +, no distention, no rebound tenderness, or no mass Extremities: No gross deformities Skin: Skin color, texture, turgor normal. No rashes seen  Psych: Appropriate mood and affect. Neurologic:  Alert, oriented to person, place, and time, thought content appropriate.  Lab Results  Component Value Date   POCGLU 182 (A) 03/16/2018   POCGLU 155 (A) 03/19/2015    Lab Results  Component Value Date   HGBA1C 10.2 (H) 03/16/2018      Assessment & Plan:  1. Type 2 diabetes mellitus with other circulatory complication, with long-term current use of insulin ([place]), remains uncontrolled on home readings.  -Increasing Lantus 20 daily at bedtime. -Start Humilin R 10 units TID with meals and hold for readings <125 Checking: - Glucose (CBG) - Microalbumin/Creatinine Ratio, Urine -Diabetes Education   2. Hyperlipidemia, unspecified hyperlipidemia type -Atorvastatin 20 mg once daily    3. Malignant neoplasm of lower-inner quadrant of right female breast, unspecified estrogen receptor status ([place]) -Scheduled to follow-up with oncologist Dr. Lindi Adie.  4. Low TSH level - Thyroid Panel With TSH  5. Yeast  infection -Diflucan 150 mg once daily   -The patient was given clear instructions to go to ER or return to medical center if symptoms do not improve, worsen or new problems develop. The patient verbalized understanding.    Molli Barrows, FNP Primary Care at [facility] [address] [place] [phone]: [phone]

## 2018-05-11 ENCOUNTER — Telehealth: Payer: Self-pay | Admitting: Hematology and Oncology

## 2018-05-11 ENCOUNTER — Inpatient Hospital Stay: Payer: Medicaid Other | Admitting: Hematology and Oncology

## 2018-05-11 ENCOUNTER — Other Ambulatory Visit: Payer: Self-pay

## 2018-05-11 ENCOUNTER — Inpatient Hospital Stay: Payer: Medicaid Other | Attending: Hematology and Oncology | Admitting: Hematology and Oncology

## 2018-05-11 ENCOUNTER — Telehealth: Payer: Self-pay | Admitting: *Deleted

## 2018-05-11 VITALS — BP 136/78 | HR 99 | Temp 99.5°F | Resp 18 | Ht 59.0 in | Wt 156.2 lb

## 2018-05-11 DIAGNOSIS — Z79811 Long term (current) use of aromatase inhibitors: Secondary | ICD-10-CM | POA: Diagnosis not present

## 2018-05-11 DIAGNOSIS — C50311 Malignant neoplasm of lower-inner quadrant of right female breast: Secondary | ICD-10-CM | POA: Insufficient documentation

## 2018-05-11 DIAGNOSIS — E119 Type 2 diabetes mellitus without complications: Secondary | ICD-10-CM

## 2018-05-11 DIAGNOSIS — Z170 Estrogen receptor positive status [ER+]: Secondary | ICD-10-CM | POA: Diagnosis not present

## 2018-05-11 LAB — COMPREHENSIVE METABOLIC PANEL
ALT: 30 IU/L (ref 0–32)
AST: 22 IU/L (ref 0–40)
Albumin/Globulin Ratio: 1.6 (ref 1.2–2.2)
Albumin: 4.6 g/dL (ref 3.8–4.9)
Alkaline Phosphatase: 155 IU/L — ABNORMAL HIGH (ref 39–117)
BUN/Creatinine Ratio: 19 (ref 9–23)
BUN: 13 mg/dL (ref 6–24)
Bilirubin Total: 0.3 mg/dL (ref 0.0–1.2)
CO2: 20 mmol/L (ref 20–29)
Calcium: 10.3 mg/dL — ABNORMAL HIGH (ref 8.7–10.2)
Chloride: 104 mmol/L (ref 96–106)
Creatinine, Ser: 0.68 mg/dL (ref 0.57–1.00)
GFR calc Af Amer: 116 mL/min/{1.73_m2} (ref >59)
GFR calc non Af Amer: 101 mL/min/{1.73_m2} (ref >59)
Globulin, Total: 2.9 g/dL (ref 1.5–4.5)
Glucose: 161 mg/dL — ABNORMAL HIGH (ref 65–99)
Potassium: 4.4 mmol/L (ref 3.5–5.2)
Sodium: 142 mmol/L (ref 134–144)
Total Protein: 7.5 g/dL (ref 6.0–8.5)

## 2018-05-11 LAB — MICROALBUMIN / CREATININE URINE RATIO
Creatinine, Urine: 18 mg/dL
Microalb/Creat Ratio: <17 mg/g creat (ref 0–29)
Microalbumin, Urine: <3 ug/mL

## 2018-05-11 LAB — HEMOGLOBIN A1C
Est. average glucose Bld gHb Est-mCnc: 226 mg/dL
Hgb A1c MFr Bld: 9.5 % — ABNORMAL HIGH (ref 4.8–5.6)

## 2018-05-11 LAB — THYROID PANEL WITH TSH
Free Thyroxine Index: 1.9 (ref 1.2–4.9)
T3 Uptake Ratio: 26 % (ref 24–39)
T4, Total: 7.3 ug/dL (ref 4.5–12.0)
TSH: 1.31 u[IU]/mL (ref 0.450–4.500)

## 2018-05-11 MED ORDER — FLUCONAZOLE 150 MG PO TABS: 150.0000 mg | ORAL_TABLET | Freq: Every day | ORAL | Status: DC

## 2018-05-11 MED ORDER — ANASTROZOLE 1 MG PO TABS: 1.0000 mg | ORAL_TABLET | Freq: Every day | ORAL | 3 refills | Status: DC

## 2018-05-11 NOTE — Assessment & Plan Note (Deleted)
Right breast irregular mass lower inner quadrant retroareolar 02/19/2015: 2.2 x 1.3 x 1.7 cm with architectural distortion and skin/nipple retraction, T2 N0 stage II a clinical stage, additional benign cysts largest 1.2 cm Right breast biopsy 02/20/2015 5:00: Invasive ductal carcinoma grade 2, perineural invasion present, ER 90%, PR 70%, HER-2 negative ratio 0.95, Ki-67 15%   Breast MRI 03/07/2015: Subareolar mass 2.2 x 2.4 x 2.5 cm extending into the retracted nipple, extending inferiorly and laterally over a distance of 4 cm, several level I right axillary lymph nodes with cortical thickening up to 7 mm with a maximum diameter of 15 mm --------------------------------------------------------------------- Current treatment: Neoadjuvant antiestrogen therapy with tamoxifen 20 mg daily (because the patient did not want to undergo surgery immediately for multiple family reasons)

## 2018-05-11 NOTE — Telephone Encounter (Signed)
Called pt to inform her of her upcoming apts we scheduled.  No answer from pt, left voice mail stating that her PET can is scheduled for Thursday March 05/18/2018 at 8 AM at [facility], NPO that morning and to hold her insulin dose that morning as well.  Her Mammogram and Ultrasound is scheduled for Wednesday 05/17/2018 at 9 AM at [facility].

## 2018-05-11 NOTE — Telephone Encounter (Signed)
Gave avs and calendar ° °

## 2018-05-11 NOTE — Assessment & Plan Note (Signed)
Right breast irregular mass lower inner quadrant retroareolar 02/19/2015: 2.2 x 1.3 x 1.7 cm with architectural distortion and skin/nipple retraction, T2 N0 stage II a clinical stage, additional benign cysts largest 1.2 cm Right breast biopsy 02/20/2015 5:00: Invasive ductal carcinoma grade 2, perineural invasion present, ER 90%, PR 70%, HER-2 negative ratio 0.95, Ki-67 15%   Breast MRI 03/07/2015: Subareolar mass 2.2 x 2.4 x 2.5 cm extending into the retracted nipple, extending inferiorly and laterally over a distance of 4 cm, several level I right axillary lymph nodes with cortical thickening up to 7 mm with a maximum diameter of 15 mm --------------------------------------------------------------------- Patient took anastrozole for a brief time and then discontinued it.  She has not come back to [place] in a long time. (There were multiple issues regarding the fact that she lost Medicaid and multiple family issues regarding her estimate)  Plan: 1.  Recheck with mammogram and ultrasound 2.  Because she is having back pain issues: I would like to obtain a PET CT scan because of suspected metastatic disease. 3.  Start anastrozole 1 mg daily today.  I sent the prescription to her pharmacy. 4.  Return to clinic in 3 weeks to discuss these results and to determine what the next plans are.  Previously the plan was to do mastectomy followed by Oncotype and adjuvant radiation and hormone therapy.  We will discuss the plans based upon the scan results and the mammogram and ultrasound.

## 2018-05-11 NOTE — Progress Notes (Signed)
Patient Care Team: Scot Jun, FNP as PCP - General (Family Medicine)  DIAGNOSIS:    ICD-10-CM   1. Malignant neoplasm of lower-inner quadrant of right breast of female, estrogen receptor positive ([place]) C50.311 MM DIAG BREAST TOMO BILATERAL   Z17.0 US BREAST LTD UNI RIGHT INC AXILLA    NM PET Image Initial (PI) Skull Base To Thigh    SUMMARY OF ONCOLOGIC HISTORY:   Malignant neoplasm of lower-inner quadrant of right breast of female, estrogen receptor positive ([place])   02/19/2015 Mammogram    Right breast irregular mass lower inner quadrant 2.2 x 1.3 x 1.7 cm with architectural distortion and skin/nipple retraction, T2 N0 stage II a clinical stage, additional benign cysts largest 1.2 cm    02/20/2015 Initial Diagnosis    Right breast biopsy 5:00: Invasive ductal carcinoma grade 2, perineural invasion present, ER 90%, PR 70%, HER-2 negative ratio 0.95, Ki-67 15%     03/07/2015 Breast MRI    right breast inferior subareolar mass measuring 2.2 x 2.4 x 2.5 cm with skin and nipple enhancement, extending inferiorly and laterally is intraductal enhancement over a distance of 4 cm, several level I right axillary lymph nodes with cortical thickening    03/13/2015 -  Anti-estrogen oral therapy    Neoadjuvant antiestrogen therapy with tamoxifen 20 mg daily (because the patient did not want to undergo surgery immediately for multiple family reasons) stopped in late 2017; anastrozole started 05/11/18     CHIEF COMPLIANT: Follow-up of right breast cancer  INTERVAL HISTORY: Emily Wilson is a 53 y.o. with above-mentioned history of right breast cancer who was on neoadjuvant antiestrogen therapy with tamoxifen because she could not undergo surgery due to social issues involving her son and a lack of insurance. I last saw her 2.5 years ago and she has been lost to care since. She presents to the clinic alone today. She lost her Medicaid in 2017 due to familial issues with the execution of a  will. She has not had any mammograms since I last saw her and has not taken tamoxifen since 2017, but recently was able to get back on Medicaid and is now interested in treatment. She notes right arm pain down to her elbow and inversion of her right breast in addition to right shoulder pain. She is interested in meeting with a dietician to help her eat better and manage her diabetes. She reviewed her medication list with me.   REVIEW OF SYSTEMS:   Constitutional: Denies fevers, chills or abnormal weight loss Eyes: Denies blurriness of vision Ears, nose, mouth, throat, and face: Denies mucositis or sore throat Respiratory: Denies cough, dyspnea or wheezes Cardiovascular: Denies palpitation, chest discomfort Gastrointestinal: Denies nausea, heartburn or change in bowel habits Skin: Denies abnormal skin rashes MSK: (+) right arm pain (+) right shoulder pain Lymphatics: Denies new lymphadenopathy or easy bruising Neurological: Denies numbness, tingling or new weaknesses Behavioral/Psych: Mood is stable, no new changes  Extremities: No lower extremity edema Breast: (+) soreness in right breast, right axilla (+) inversion of right nipple All other systems were reviewed with the patient and are negative.  I have reviewed the past medical history, past surgical history, social history and family history with the patient and they are unchanged from previous note.  ALLERGIES:  is allergic to codeine; hydrocodone-acetaminophen; meloxicam; naproxen; propoxyphene n-acetaminophen; sulfonamide derivatives; and tramadol.  MEDICATIONS:  Current Outpatient Medications  Medication Sig Dispense Refill   ALPRAZolam (XANAX) 0.5 MG tablet TAKE ONE (1) TABLET  BY MOUTH DAILY AS NEEDED FOR AXNIETY 30 tablet 0   anastrozole (ARIMIDEX) 1 MG tablet Take 1 tablet (1 mg total) by mouth daily. 90 tablet 3   atorvastatin (LIPITOR) 20 MG tablet Take 1 tablet (20 mg total) by mouth daily. 90 tablet 3   blood glucose  meter kit and supplies KIT Dispense based on patient preference and insurance coverage. Use up to four times daily as directed. (FOR ICD-9 250.00, 250.01). 1 each 0   Blood Glucose Monitoring Suppl (ONE TOUCH ULTRA 2) w/Device KIT 1 Device by Does not apply route once. 1 each 0   diclofenac (VOLTAREN) 75 MG EC tablet TAKE 1 TABLET BY MOUTH TWICE A DAY WITH FOOD AS NEEDED     fluconazole (DIFLUCAN) 150 MG tablet Take 1 tablet (150 mg total) by mouth daily. 2 tablet    glucose blood (ONE TOUCH TEST STRIPS) test strip 1 each by Other route 3 (three) times daily. 100 each 12   Insulin Glargine (LANTUS SOLOSTAR) 100 UNIT/ML Solostar Pen Inject 20 Units into the skin at bedtime. 5 pen 3   Insulin Pen Needle (NOVOFINE) 30G X 8 MM MISC Use once at night to inject insulin. E.11.9 100 each 3   insulin regular (HUMULIN R) 100 units/mL injection Inject 0.1 mLs (10 Units total) into the skin 3 (three) times daily before meals. E.11.9. Hold dose for blood sugar less than 125 10 mL 11   Lancets (ONETOUCH ULTRASOFT) lancets 1 each by Other route 3 (three) times daily. 100 each 12   metoprolol succinate (TOPROL XL) 25 MG 24 hr tablet Take 1 tablet (25 mg total) by mouth daily. 90 tablet 3   Syringe/Needle, Disp, (SYRINGE 6CC/21GX1-1/4") 21G X 1-1/4" 6 ML MISC Use to administer insulin 3 times daily with meals 100 each 11   No current facility-administered medications for this visit.     PHYSICAL EXAMINATION: ECOG PERFORMANCE STATUS: 1 - Symptomatic but completely ambulatory  Vitals:   05/11/18 1328  BP: 136/78  Pulse: 99  Resp: 18  Temp: 99.5 F (37.5 C)  SpO2: 98%   Filed Weights   05/11/18 1328  Weight: 156 lb 3.2 oz (70.9 kg)    GENERAL: alert, no distress and comfortable SKIN: skin color, texture, turgor are normal, no rashes or significant lesions EYES: normal, Conjunctiva are pink and non-injected, sclera clear OROPHARYNX: no exudate, no erythema and lips, buccal mucosa, and tongue  normal  NECK: supple, thyroid normal size, non-tender, without nodularity LYMPH: no palpable lymphadenopathy in the cervical, axillary or inguinal LUNGS: clear to auscultation and percussion with normal breathing effort HEART: regular rate & rhythm and no murmurs and no lower extremity edema ABDOMEN: abdomen soft, non-tender and normal bowel sounds MUSCULOSKELETAL: no cyanosis of digits and no clubbing  NEURO: alert & oriented x 3 with fluent speech, no focal motor/sensory deficits EXTREMITIES: No lower extremity edema BREAST: Palpable mass in the right breast with dimpling of the skin and tenderness to deep palpation.  No axillary lymph nodes are palpable but there is tenderness in the right axilla. (exam performed in the presence of a chaperone)  LABORATORY DATA:  I have reviewed the data as listed CMP Latest Ref Rng & Units 05/10/2018 03/16/2018 02/28/2018  Glucose 65 - 99 mg/dL 161(H) 140(H) 233(H)  BUN 6 - 24 mg/dL '13 9 6  ' Creatinine 0.57 - 1.00 mg/dL 0.68 0.73 0.60  Sodium 134 - 144 mmol/L 142 135 134(L)  Potassium 3.5 - 5.2 mmol/L 4.4 4.3 4.4  Chloride 96 - 106 mmol/L 104 97 98  CO2 20 - 29 mmol/L 20 21 -  Calcium 8.7 - 10.2 mg/dL 10.3(H) 9.9 -  Total Protein 6.0 - 8.5 g/dL 7.5 6.7 -  Total Bilirubin 0.0 - 1.2 mg/dL 0.3 0.7 -  Alkaline Phos 39 - 117 IU/L 155(H) 128(H) -  AST 0 - 40 IU/L 22 20 -  ALT 0 - 32 IU/L 30 27 -    Lab Results  Component Value Date   WBC 6.5 03/16/2018   HGB 15.4 03/16/2018   HCT 44.9 03/16/2018   MCV 90 03/16/2018   PLT 440 03/16/2018   NEUTROABS 2.6 03/16/2018    ASSESSMENT & PLAN:  Malignant neoplasm of lower-inner quadrant of right breast of female, estrogen receptor positive ([place]) Right breast irregular mass lower inner quadrant retroareolar 02/19/2015: 2.2 x 1.3 x 1.7 cm with architectural distortion and skin/nipple retraction, T2 N0 stage II a clinical stage, additional benign cysts largest 1.2 cm Right breast biopsy 02/20/2015 5:00:  Invasive ductal carcinoma grade 2, perineural invasion present, ER 90%, PR 70%, HER-2 negative ratio 0.95, Ki-67 15%   Breast MRI 03/07/2015: Subareolar mass 2.2 x 2.4 x 2.5 cm extending into the retracted nipple, extending inferiorly and laterally over a distance of 4 cm, several level I right axillary lymph nodes with cortical thickening up to 7 mm with a maximum diameter of 15 mm --------------------------------------------------------------------- Patient took anastrozole for a brief time and then discontinued it.  She has not come back to [place] in a long time. (There were multiple issues regarding the fact that she lost Medicaid and multiple family issues regarding her estimate)  Plan: 1.  Recheck with mammogram and ultrasound 2.  Because she is having back pain issues: I would like to obtain a PET CT scan because of suspected metastatic disease. 3.  Start anastrozole 1 mg daily today.  I sent the prescription to her pharmacy. 4.  Return to clinic in 3 weeks to discuss these results and to determine what the next plans are.  Previously the plan was to do mastectomy followed by Oncotype and adjuvant radiation and hormone therapy.  We will discuss the plans based upon the scan results and the mammogram and ultrasound.      Orders Placed This Encounter  Procedures   MM DIAG BREAST TOMO BILATERAL    Standing Status:   Future    Standing Expiration Date:   05/11/2019    Order Specific Question:   Reason for Exam (SYMPTOM  OR DIAGNOSIS REQUIRED)    Answer:   Breast cancer restaging    Order Specific Question:   Is the patient pregnant?    Answer:   No    Order Specific Question:   Preferred imaging location?    [facility]   US BREAST LTD UNI RIGHT INC AXILLA    Imaging Changes in Response to COVID-19:  [facility] are committed to keeping our patients and their families safe. We will continue CT, MRI, Ultrasound, Diagnostic Radiography, and  Diagnostic Mammography exams at SPECIFIC sites for patients who urgently need them. As part of our efforts to limit the spread of COVID-19 in our communities, effective immediately, we are suspending elective imaging and procedures for those cases where postponement will not cause harm to the patient if it is not performed within the next 4 weeks. If you feel that your patient has imaging or procedure needs that we are not scheduling, please  call [phone] and we will do our best to accommodate.    Standing Status:   Future    Standing Expiration Date:   07/11/2019    Order Specific Question:   Reason for Exam (SYMPTOM  OR DIAGNOSIS REQUIRED)    Answer:   Breast cancer restaging    Order Specific Question:   Preferred imaging location?    Answer:   [facility]   NM PET Image Initial (PI) Skull Base To Thigh    Standing Status:   Future    Standing Expiration Date:   05/11/2019    Order Specific Question:   ** REASON FOR EXAM (FREE TEXT)    Answer:   Back pain with advanced breast cancer    Order Specific Question:   If indicated for the ordered procedure, I authorize the administration of a radiopharmaceutical per Radiology protocol    Answer:   Yes    Order Specific Question:   Is the patient pregnant?    Answer:   No    Order Specific Question:   Preferred imaging location?    Answer:   [facility]    Order Specific Question:   Radiology Contrast Protocol - do NOT remove file path    Answer:   [email or web address]   The patient has a good understanding of the overall plan. she agrees with it. she will call with any problems that may develop before the next visit here.  [name], MD 05/11/2018  Julious Oka Dorshimer am acting as scribe for Dr. [name].  I have reviewed the above documentation for accuracy and completeness, and I agree with the above.

## 2018-05-15 ENCOUNTER — Telehealth: Payer: Self-pay

## 2018-05-15 ENCOUNTER — Telehealth: Payer: Self-pay | Admitting: *Deleted

## 2018-05-15 ENCOUNTER — Other Ambulatory Visit: Payer: Self-pay | Admitting: Hematology and Oncology

## 2018-05-15 DIAGNOSIS — Z170 Estrogen receptor positive status [ER+]: Principal | ICD-10-CM

## 2018-05-15 DIAGNOSIS — C50311 Malignant neoplasm of lower-inner quadrant of right female breast: Secondary | ICD-10-CM

## 2018-05-15 NOTE — Progress Notes (Signed)
Her PET CT scan was not approved by insurance. We will obtain CT chest abdomen pelvis and bone scan.

## 2018-05-15 NOTE — Progress Notes (Signed)
Patient called and was inform on lab results.  Pt. Understood. Pt. Verified DOB.  Emily Wilson

## 2018-05-15 NOTE — Telephone Encounter (Signed)
Called pt and instructed her that her insurance denied prior auth for a PET scan and that Dr. Lindi Adie has ordered a bone scan and a CT of the abdomen, pelvis, and chest.  Informed pt that apt for these two tests have been made for 4/31/2020.  The bone scan will be at 10 am and 1pm and the CT will be at 2pm.  I instructed pt that when she arrives that morning, to stop by the cancer center to pick up her oral contrast and that she will need to drink the first bottle of contrast at noon and the second bottle of contrast at 1pm prior to her CT.  Instructed pt to call us if she has any question, pt verbalized understanding.

## 2018-05-15 NOTE — Telephone Encounter (Signed)
Patient called and was inform on lab results.  Pt. Understood. Pt. Verified DOB.

## 2018-05-16 ENCOUNTER — Other Ambulatory Visit: Payer: Self-pay | Admitting: Family Medicine

## 2018-05-16 DIAGNOSIS — E1165 Type 2 diabetes mellitus with hyperglycemia: Secondary | ICD-10-CM

## 2018-05-16 NOTE — Telephone Encounter (Signed)
Routing to [employer]' rx refill pool

## 2018-05-17 ENCOUNTER — Ambulatory Visit
Admission: RE | Admit: 2018-05-17 | Discharge: 2018-05-17 | Disposition: A | Discharge status: Home / Self Care | Payer: Medicaid Other | Source: Ambulatory Visit | Attending: Hematology and Oncology | Admitting: Hematology and Oncology

## 2018-05-17 ENCOUNTER — Other Ambulatory Visit: Payer: Self-pay

## 2018-05-17 DIAGNOSIS — Z170 Estrogen receptor positive status [ER+]: Principal | ICD-10-CM

## 2018-05-17 DIAGNOSIS — C50311 Malignant neoplasm of lower-inner quadrant of right female breast: Secondary | ICD-10-CM

## 2018-05-18 ENCOUNTER — Ambulatory Visit (HOSPITAL_COMMUNITY): Payer: Medicaid Other

## 2018-05-23 ENCOUNTER — Ambulatory Visit (HOSPITAL_COMMUNITY)
Admission: RE | Admit: 2018-05-23 | Discharge: 2018-05-23 | Disposition: A | Discharge status: Home / Self Care | Payer: Medicaid Other | Source: Ambulatory Visit | Attending: Hematology and Oncology | Admitting: Hematology and Oncology

## 2018-05-23 ENCOUNTER — Other Ambulatory Visit: Payer: Self-pay

## 2018-05-23 ENCOUNTER — Encounter (HOSPITAL_COMMUNITY): Payer: Self-pay

## 2018-05-23 DIAGNOSIS — C50311 Malignant neoplasm of lower-inner quadrant of right female breast: Secondary | ICD-10-CM

## 2018-05-23 DIAGNOSIS — Z170 Estrogen receptor positive status [ER+]: Principal | ICD-10-CM

## 2018-05-23 DIAGNOSIS — C50111 Malignant neoplasm of central portion of right female breast: Secondary | ICD-10-CM | POA: Diagnosis not present

## 2018-05-23 MED ORDER — GLUCOSE BLOOD VI STRP: ORAL_STRIP | 2 refills | Status: DC

## 2018-05-23 MED ORDER — TECHNETIUM TC 99M MEDRONATE IV KIT
20.0000 | PACK | Freq: Once | INTRAVENOUS | Status: AC | PRN
  Administered 2018-05-23: 20.4 via INTRAVENOUS

## 2018-05-23 MED ORDER — SODIUM CHLORIDE (PF) 0.9 % IJ SOLN
INTRAMUSCULAR | Status: AC
  Filled 2018-05-23: qty 50

## 2018-05-23 MED ORDER — IOHEXOL 300 MG/ML  SOLN
100.0000 mL | Freq: Once | INTRAMUSCULAR | Status: AC | PRN
  Administered 2018-05-23: 100 mL via INTRAVENOUS

## 2018-05-29 NOTE — Progress Notes (Signed)
Patient Care Team: Scot Jun, FNP as PCP - General (Family Medicine)  DIAGNOSIS:    ICD-10-CM   1. Malignant neoplasm of lower-inner quadrant of right breast of female, estrogen receptor positive ([place]) C50.311    Z17.0   Patient location: Clinic Physician location: WebEx  SUMMARY OF ONCOLOGIC HISTORY:   Malignant neoplasm of lower-inner quadrant of right breast of female, estrogen receptor positive ([place])   02/19/2015 Mammogram    Right breast irregular mass lower inner quadrant 2.2 x 1.3 x 1.7 cm with architectural distortion and skin/nipple retraction, T2 N0 stage II a clinical stage, additional benign cysts largest 1.2 cm    02/20/2015 Initial Diagnosis    Right breast biopsy 5:00: Invasive ductal carcinoma grade 2, perineural invasion present, ER 90%, PR 70%, HER-2 negative ratio 0.95, Ki-67 15%     03/07/2015 Breast MRI    right breast inferior subareolar mass measuring 2.2 x 2.4 x 2.5 cm with skin and nipple enhancement, extending inferiorly and laterally is intraductal enhancement over a distance of 4 cm, several level I right axillary lymph nodes with cortical thickening    03/13/2015 -  Anti-estrogen oral therapy    Neoadjuvant antiestrogen therapy with tamoxifen 20 mg daily (because the patient did not want to undergo surgery immediately for multiple family reasons) stopped in late 2017; anastrozole started 05/11/18    05/23/2018 Imaging    CT CAP: 2.9 cm spiculated soft tissue density central right breast, no evidence of soft tissue metastatic disease within the chest abdomen pelvis.  Subcentimeter sclerotic bone lesions T10, left pubis, right posterior ilium Bone scan: Foci of uptake left frontal calvarium, T-spine, right ilium     CHIEF COMPLIANT: Follow-up to review recent scans and discuss treatment  INTERVAL HISTORY: Emily Wilson is a 53 y.o. with above-mentioned history of right breast cancer. A bone scan on 05/23/18 showed areas suspicious for  metastatic disease in the spine, pelvis, and skull. A CT chest/abdomen/pelvis from 05/23/18 showed the known right breast mass measuring 2.9cm, and no evidence of soft tissue metastatic disease. She presents to the clinic today to review her recent scans and discuss treatment options.  Complaining of mild nausea from anastrozole.  Denies any hot flashes or myalgias.  REVIEW OF SYSTEMS:   Constitutional: Denies fevers, chills or abnormal weight loss Eyes: Denies blurriness of vision Ears, nose, mouth, throat, and face: Denies mucositis or sore throat Respiratory: Denies cough, dyspnea or wheezes Cardiovascular: Denies palpitation, chest discomfort Gastrointestinal: Mild nausea from anastrozole Skin: Denies abnormal skin rashes Lymphatics: Denies new lymphadenopathy or easy bruising Neurological: Denies numbness, tingling or new weaknesses, chronic back pain issues for which she takes Voltaren Behavioral/Psych: Mood is stable, no new changes  Extremities: No lower extremity edema Breast: denies any pain or lumps or nodules in either breasts All other systems were reviewed with the patient and are negative.  I have reviewed the past medical history, past surgical history, social history and family history with the patient and they are unchanged from previous note.  ALLERGIES:  is allergic to codeine; hydrocodone-acetaminophen; meloxicam; naproxen; propoxyphene n-acetaminophen; sulfonamide derivatives; and tramadol.  MEDICATIONS:  Current Outpatient Medications  Medication Sig Dispense Refill  . Accu-Chek FastClix Lancets MISC USE TO TEST BLOOD SUGAR UP TO FOUR TIMES DAILY AS DIRECTED 204 each 3  . ALPRAZolam (XANAX) 0.5 MG tablet TAKE ONE (1) TABLET BY MOUTH DAILY AS NEEDED FOR AXNIETY 30 tablet 0  . anastrozole (ARIMIDEX) 1 MG tablet Take 1 tablet (1 mg  total) by mouth daily. 90 tablet 3  . atorvastatin (LIPITOR) 20 MG tablet Take 1 tablet (20 mg total) by mouth daily. 90 tablet 3  . BD  INSULIN SYRINGE U/F 31G X 5/16" 0.5 ML MISC USE TO ADMINISTER INSULIN 3 TIMES DAILY WITH MEALS    . Blood Glucose Monitoring Suppl (ACCU-CHEK GUIDE ME) w/Device KIT by Does not apply route.    . diclofenac (VOLTAREN) 75 MG EC tablet TAKE 1 TABLET BY MOUTH TWICE A DAY WITH FOOD AS NEEDED    . fluconazole (DIFLUCAN) 150 MG tablet Take 1 tablet (150 mg total) by mouth daily. 2 tablet   . glucose blood (ACCU-CHEK GUIDE) test strip Use to check FSBS up to 4 times a day. Dx: E11.65 200 each 2  . Insulin Glargine (LANTUS SOLOSTAR) 100 UNIT/ML Solostar Pen Inject 20 Units into the skin at bedtime. 5 pen 3  . Insulin Pen Needle (NOVOFINE) 30G X 8 MM MISC Use once at night to inject insulin. E.11.9 100 each 3  . insulin regular (HUMULIN R) 100 units/mL injection Inject 0.1 mLs (10 Units total) into the skin 3 (three) times daily before meals. E.11.9. Hold dose for blood sugar less than 125 10 mL 11  . metoprolol succinate (TOPROL XL) 25 MG 24 hr tablet Take 1 tablet (25 mg total) by mouth daily. 90 tablet 3   No current facility-administered medications for this visit.     PHYSICAL EXAMINATION: ECOG PERFORMANCE STATUS: 1 - Symptomatic but completely ambulatory  Vitals:   05/31/18 1140  BP: 130/80  Pulse: 99  Resp: 18  Temp: 98 F (36.7 C)  SpO2: 97%   Filed Weights   05/31/18 1140  Weight: 156 lb 6.4 oz (70.9 kg)    WebEX visit: No Physical exam other than visual inspection  LABORATORY DATA:  I have reviewed the data as listed CMP Latest Ref Rng & Units 05/10/2018 03/16/2018 02/28/2018  Glucose 65 - 99 mg/dL 161(H) 140(H) 233(H)  BUN 6 - 24 mg/dL '13 9 6  ' Creatinine 0.57 - 1.00 mg/dL 0.68 0.73 0.60  Sodium 134 - 144 mmol/L 142 135 134(L)  Potassium 3.5 - 5.2 mmol/L 4.4 4.3 4.4  Chloride 96 - 106 mmol/L 104 97 98  CO2 20 - 29 mmol/L 20 21 -  Calcium 8.7 - 10.2 mg/dL 10.3(H) 9.9 -  Total Protein 6.0 - 8.5 g/dL 7.5 6.7 -  Total Bilirubin 0.0 - 1.2 mg/dL 0.3 0.7 -  Alkaline Phos 39 - 117  IU/L 155(H) 128(H) -  AST 0 - 40 IU/L 22 20 -  ALT 0 - 32 IU/L 30 27 -    Lab Results  Component Value Date   WBC 6.5 03/16/2018   HGB 15.4 03/16/2018   HCT 44.9 03/16/2018   MCV 90 03/16/2018   PLT 440 03/16/2018   NEUTROABS 2.6 03/16/2018    ASSESSMENT & PLAN:  Malignant neoplasm of lower-inner quadrant of right breast of female, estrogen receptor positive ([place]) Right breast irregular mass lower inner quadrant retroareolar 02/19/2015: 2.2 x 1.3 x 1.7 cm with architectural distortion and skin/nipple retraction, T2 N0 stage II a clinical stage, additional benign cysts largest 1.2 cm Right breast biopsy 02/20/2015 5:00: Invasive ductal carcinoma grade 2, perineural invasion present, ER 90%, PR 70%, HER-2 negative ratio 0.95, Ki-67 15%   Breast MRI 03/07/2015: Subareolar mass 2.2 x 2.4 x 2.5 cm extending into the retracted nipple, extending inferiorly and laterally over a distance of 4 cm, several level I right  axillary lymph nodes with cortical thickening up to 7 mm with a maximum diameter of 15 mm --------------------------------------------------------------------- Patient took anastrozole for a brief time and then discontinued it.  She has not come back to [place] in a long time. (There were multiple issues regarding the fact that she lost Medicaid and multiple family issues)  CT CAP and bone scan: 05/23/2018: 2.9 cm spiculated soft tissue density central right breast, no evidence of soft tissue metastatic disease within the chest abdomen pelvis.  Subcentimeter sclerotic bone lesions T10, left pubis, right posterior ilium Bone scan: Foci of uptake left frontal calvarium, T-spine, right ilium  Radiology review: Based upon these bony lesions, she currently has metastatic disease.  I do not recommend aggressive therapy including chemotherapy.  She does not have a vast amount of metastatic disease but there is clear evidence of bone involvement. I reviewed the images of the scans with the  patient in great detail and provided her with copies of all of these reports.  I was able to show the images using WebEx share screen. Goal of treatment: Palliation  Treatment plan: Ibrance with anastrozole Emily Wilson will be added in 3 months after the current corona virus pandemic subsides)  Anastrozole toxicities: Nausea: Patient is trying to take ginger to curb the nausea.  If her symptoms do not get better then we can switch her to letrozole.  Ibrance: I discussed the risks and benefits of Ibrance including myelosuppression especially neutropenia and with that risk of infection, there is risk of pulmonary embolism and mild peripheral neuropathy as well. Fatigue, nausea, diarrhea, decreased appetite as well as alopecia and thrombocytopenia are also potential side effects of Ibrance  We could also ask her to participate in the CDK 4/6 inhibitor education/compliance study being done by our pharmacist Jalene Mullet.  Return to clinic in 3 months with labs and follow-up so that we can start her on Ibrance along with anastrozole.  No orders of the defined types were placed in this encounter.  The patient has a good understanding of the overall plan. she agrees with it. she will call with any problems that may develop before the next visit here.  [name], MD 05/31/2018  Julious Oka Dorshimer am acting as scribe for Dr. [name].  I have reviewed the above documentation for accuracy and completeness, and I agree with the above.

## 2018-05-30 NOTE — Assessment & Plan Note (Signed)
Right breast irregular mass lower inner quadrant retroareolar 02/19/2015: 2.2 x 1.3 x 1.7 cm with architectural distortion and skin/nipple retraction, T2 N0 stage II a clinical stage, additional benign cysts largest 1.2 cm Right breast biopsy 02/20/2015 5:00: Invasive ductal carcinoma grade 2, perineural invasion present, ER 90%, PR 70%, HER-2 negative ratio 0.95, Ki-67 15%   Breast MRI 03/07/2015: Subareolar mass 2.2 x 2.4 x 2.5 cm extending into the retracted nipple, extending inferiorly and laterally over a distance of 4 cm, several level I right axillary lymph nodes with cortical thickening up to 7 mm with a maximum diameter of 15 mm --------------------------------------------------------------------- Patient took anastrozole for a brief time and then discontinued it.  She has not come back to [place] in a long time. (There were multiple issues regarding the fact that she lost Medicaid and multiple family issues regarding her estimate)  CT CAP and bone scan: 05/23/2018: 2.9 cm spiculated soft tissue density central right breast, no evidence of soft tissue metastatic disease within the chest abdomen pelvis.  Subcentimeter sclerotic bone lesions T10, left pubis, right posterior ilium Bone scan: Foci of uptake left frontal calvarium, T-spine, right ilium  Radiology review: Based upon these bony lesions, she currently has metastatic disease.  I do not recommend aggressive therapy including chemotherapy.  She does not have a vast amount of metastatic disease but there is clear evidence of bone involvement. Goal of treatment: Palliation  Treatment plan: Ibrance with anastrozole  Ibrance: I discussed the risks and benefits of Ibrance including myelosuppression especially neutropenia and with that risk of infection, there is risk of pulmonary embolism and mild peripheral neuropathy as well. Fatigue, nausea, diarrhea, decreased appetite as well as alopecia and thrombocytopenia are also potential side effects  of Leslee Home  We also counseled her to participate in the CDK 4/6 inhibitor education/compliance study being done by our pharmacist Jalene Mullet.  I sent the prescription for Ibrance.  She will start taking this and I will see her back in 2 weeks after starting treatment.

## 2018-05-31 ENCOUNTER — Other Ambulatory Visit: Payer: Self-pay

## 2018-05-31 ENCOUNTER — Inpatient Hospital Stay: Payer: Medicaid Other | Attending: Hematology and Oncology | Admitting: Hematology and Oncology

## 2018-05-31 ENCOUNTER — Telehealth: Payer: Self-pay | Admitting: Nutrition

## 2018-05-31 ENCOUNTER — Inpatient Hospital Stay: Payer: Medicaid Other | Admitting: Nutrition

## 2018-05-31 DIAGNOSIS — Z170 Estrogen receptor positive status [ER+]: Secondary | ICD-10-CM

## 2018-05-31 DIAGNOSIS — R110 Nausea: Secondary | ICD-10-CM

## 2018-05-31 DIAGNOSIS — C50919 Malignant neoplasm of unspecified site of unspecified female breast: Secondary | ICD-10-CM | POA: Insufficient documentation

## 2018-05-31 DIAGNOSIS — C50311 Malignant neoplasm of lower-inner quadrant of right female breast: Secondary | ICD-10-CM

## 2018-05-31 DIAGNOSIS — C7951 Secondary malignant neoplasm of bone: Secondary | ICD-10-CM | POA: Diagnosis not present

## 2018-05-31 NOTE — Patient Instructions (Signed)
Palbociclib (Ibrance) capsules What is this medicine? PALBOCICLIB (pal boe SYE klib) is a medicine that targets proteins in cancer cells and stops the cancer cells from growing. It is used to treat breast cancer. This medicine may be used for other purposes; ask your health care provider or pharmacist if you have questions. COMMON BRAND NAME(S): Ibrance What should I tell my health care provider before I take this medicine? They need to know if you have any of these conditions: -infection (especially a virus infection such as chickenpox, cold sores, or herpes) -low blood counts, like low white cell, platelet, or red cell counts -lung or breathing disease, like asthma -an unusual or allergic reaction to palbociclib, other medicines, foods, dyes, or preservatives -pregnant or trying to get pregnant -breast-feeding How should I use this medicine? Take this medicine by mouth with a glass of water. Follow the directions on the prescription label. Take this medicine with food. Avoid grapefruit and grapefruit juice while you are taking this medicine. Swallow the capsule whole. Do not cut, crush or chew this medicine. Take your medicine at regular intervals. Do not take it more often than directed. Do not stop taking except on your doctor's advice. Talk to your pediatrician regarding the use of this medicine in children. Special care may be needed. Overdosage: If you think you have taken too much of this medicine contact a poison control center or emergency room at once. NOTE: This medicine is only for you. Do not share this medicine with others. What if I miss a dose? If you miss a dose or vomit after taking a dose, do not take another dose on that day. Take your next dose at your regular time. What may interact with this medicine? This medicine may interact with the following medications: -alfentanil -antiviral medicines for HIV or AIDS -carbamazepine -certain medicines for fungal infections such  as itraconazole, ketoconazole, posaconazole, and voriconazole -clarithromycin -cyclosporine -enzalutamide -ergot alkaloids like dihydroergotamine, ergotamine -everolimus -fentanyl -grapefruit juice -midazolam -nefazodone -phenytoin -pimozide -quinidine -rifampin -sirolimus -St. John's Wort -tacrolimus -telithromycin This list may not describe all possible interactions. Give your health care provider a list of all the medicines, herbs, non-prescription drugs, or dietary supplements you use. Also tell them if you smoke, drink alcohol, or use illegal drugs. Some items may interact with your medicine. What should I watch for while using this medicine? Visit your doctor for regular check ups. Report any side effects. Continue your course of treatment unless your doctor tells you to stop. You will need blood work done while you are taking this medicine. Do not become pregnant while taking this medicine or for at least 3 weeks after stopping it. Women should inform their doctor if they wish to become pregnant or think they might be pregnant. Men should not father a child while taking this medicine and for 3 months after stopping it. There is a potential for serious side effects to an unborn child. Men should inform their doctors if they wish to father a child later. This medicine may lower sperm counts. Talk to your health care professional or pharmacist for more information. Do not breast-feed an infant while taking this medicine or for 3 weeks after the last dose. Avoid taking products that contain aspirin, acetaminophen, ibuprofen, naproxen, or ketoprofen unless instructed by your doctor. These medicines may hide a fever. Be careful brushing and flossing your teeth or using a toothpick because you may get an infection or bleed more easily. If you have any dental work  done, tell your dentist you are receiving this medicine. Call your doctor or health care professional for advice if you get a  fever, chills or sore throat, or other symptoms of a cold or flu. Do not treat yourself. This drug decreases your body's ability to fight infections. Try to avoid being around people who are sick. This medicine may increase your risk to bruise or bleed. Call your doctor or health care professional if you notice any unusual bleeding. This drug may make you feel generally unwell. This is not uncommon, as chemotherapy can affect healthy cells as well as cancer cells. Report any side effects. Continue your course of treatment even though you feel ill unless your doctor tells you to stop. What side effects may I notice from receiving this medicine? Side effects that you should report to your doctor or health care professional as soon as possible: -allergic reactions like skin rash, itching or hives, swelling of the face, lips, or tongue -breathing problems -cough -dizziness -mouth sores -low blood counts - this medicine may decrease the number of white blood cells, red blood cells and platelets. You may be at increased risk for infections and bleeding. -pain, tingling, numbness in the hands or feet -severe or persistent diarrhea, nausea, vomiting -signs and symptoms of infection like fever or chills; cough; sore throat; pain or trouble passing urine -signs of decreased platelets or bleeding - nosebleed, bruising, pinpoint red spots on the skin, black, tarry stools, blood in the urine -signs of decreased red blood cells - unusually weak or tired, feeling faint or lightheaded, falls Side effects that usually do not require medical attention (report to your doctor or health care professional if they continue or are bothersome): -decreased appetite -hair thinning or hair loss -mild diarrhea -nausea -weak or tired This list may not describe all possible side effects. Call your doctor for medical advice about side effects. You may report side effects to FDA at 1-800-FDA-1088. Where should I keep my  medicine? Keep out of the reach of children. Store between 20 and 25 degrees C (68 and 77 degrees F). Throw away any unused medicine after the expiration date. NOTE: This sheet is a summary. It may not cover all possible information. If you have questions about this medicine, talk to your doctor, pharmacist, or health care provider.  2019 Elsevier/Gold Standard (2017-11-04 15:09:05)

## 2018-05-31 NOTE — Telephone Encounter (Signed)
I contacted patient by telephone.  She wanted to wait until after she got her results from her doctor today.  She has my contact information and will call me back this afternoon.

## 2018-05-31 NOTE — Addendum Note (Signed)
Addended by: [name] on: 05/31/2018 12:17 PM   Modules accepted: Orders

## 2018-06-01 ENCOUNTER — Telehealth: Payer: Self-pay | Admitting: Hematology and Oncology

## 2018-06-01 NOTE — Telephone Encounter (Signed)
Called regarding 7/9

## 2018-06-05 ENCOUNTER — Ambulatory Visit: Payer: Medicaid Other | Admitting: Nutrition

## 2018-06-05 ENCOUNTER — Telehealth: Payer: Self-pay | Admitting: Nutrition

## 2018-06-05 NOTE — Progress Notes (Signed)
RD working remotely.  53 year old female recently diagnosed with metastatic disease after breast cancer diagnosis in 2016. She is a patient of Dr. Lindi Adie.  PMH includes DM II, HLD, Anxiety  Medications include Xanax, Lipitor, Insulin, Jardiance.  Labs include Glucose of 161.  Height:59 inches Weight: 156.2 pounds. BMI: 31.55.  Patient was lost to follow up for ~2.5 years and now recently diagnosed with metastatic disease. Review of chart reveals patient often skips meals and does not routinely exercise. Patient requests information on glycemic index of foods or PH balance of foods. Reports she cannot exercise because of pain. Agrees to have some basic nutrition information mailed to her.  Nutrition Diagnosis: Food and Nutrition Related Knowledge Deficit related to DM and Breast cancer as evidenced by patient requesting information.  Intervention: Educated patient on healthy plant based diet. Encouraged portion control of CHO containing foods. Encouraged activity as tolerated. Will mail fact sheets on CHO counting from AND to patient's PO box at her request. Refer to [facility] Diabetes Education [facility] for DM education if needed.  Monitoring, Evaluation, Goals: Patient will tolerate plant based diet, CHO controlled diet for improved glycemic control.  Nutrition Diagnosis resolved. No follow up scheduled.

## 2018-06-05 NOTE — Telephone Encounter (Signed)
Patient left message on my voice mail to contact her today at 1:30 pm for telephone visit.

## 2018-08-09 ENCOUNTER — Ambulatory Visit: Payer: Medicaid Other | Admitting: Family Medicine

## 2018-08-14 ENCOUNTER — Other Ambulatory Visit (HOSPITAL_COMMUNITY)
Admission: RE | Admit: 2018-08-14 | Discharge: 2018-08-14 | Disposition: A | Discharge status: Home / Self Care | Payer: Medicaid Other | Source: Ambulatory Visit | Attending: Family Medicine | Admitting: Family Medicine

## 2018-08-14 ENCOUNTER — Encounter: Payer: Self-pay | Admitting: Family Medicine

## 2018-08-14 ENCOUNTER — Ambulatory Visit (INDEPENDENT_AMBULATORY_CARE_PROVIDER_SITE_OTHER): Payer: Medicaid Other | Admitting: Family Medicine

## 2018-08-14 ENCOUNTER — Other Ambulatory Visit: Payer: Self-pay

## 2018-08-14 VITALS — BP 129/88 | HR 96 | Temp 97.7°F | Resp 17 | Ht 59.0 in | Wt 161.2 lb

## 2018-08-14 DIAGNOSIS — R300 Dysuria: Secondary | ICD-10-CM

## 2018-08-14 DIAGNOSIS — N760 Acute vaginitis: Secondary | ICD-10-CM

## 2018-08-14 DIAGNOSIS — E1165 Type 2 diabetes mellitus with hyperglycemia: Secondary | ICD-10-CM

## 2018-08-14 DIAGNOSIS — IMO0001 Reserved for inherently not codable concepts without codable children: Secondary | ICD-10-CM

## 2018-08-14 LAB — POCT URINALYSIS DIP (CLINITEK)
Bilirubin, UA: NEGATIVE
Blood, UA: NEGATIVE
Glucose, UA: 100 mg/dL — AB
Ketones, POC UA: NEGATIVE mg/dL
Leukocytes, UA: NEGATIVE
Nitrite, UA: NEGATIVE
POC PROTEIN,UA: NEGATIVE
Spec Grav, UA: >=1.03 — AB (ref 1.010–1.025)
Urobilinogen, UA: 0.2 E.U./dL
pH, UA: 5.5 (ref 5.0–8.0)

## 2018-08-14 LAB — POCT CBG (FASTING - GLUCOSE)-MANUAL ENTRY: Glucose Fasting, POC: 149 mg/dL — AB (ref 70–99)

## 2018-08-14 MED ORDER — FLUCONAZOLE 150 MG PO TABS: 150.0000 mg | ORAL_TABLET | Freq: Once | ORAL | 0 refills | Status: AC

## 2018-08-14 NOTE — Progress Notes (Signed)
Patient ID: Emily Wilson, female    DOB: 1965-09-20, 53 y.o.   MRN: [id number]  PCP: Scot Jun, FNP  Chief Complaint  Patient presents with  . Vaginitis    Subjective:  HPI Emily Wilson is a 53 y.o. female presents for evaluation of vaginitis.  Emily Wilson has Hyperthyroidism; Anxiety state; Malignant neoplasm of lower-inner quadrant of right breast of female, estrogen receptor positive ([place]); Diabetes mellitus type 2, uncontrolled, without complications ([place]); HLD (hyperlipidemia); Family history of breast cancer in female; and Bone metastasis ([place]) on their problem list.   Currently under oncology management/treatment for breast cancer.   Vaginitis/diabetes Reports a few weeks for vaginal irritation and burning. History of candidal infections related to poor glycemic control. Denies visible discharge. Endorses vaginal burning during urination.  Denies any dysuria, chills, lower abdominal pain, or CVA tenderness.  Patient was treated successfully with Diflucan 3 months ago secondary to a yeast infection.  Patient suffers from poorly controlled diabetes with an last A1c 9.5.  She recently had an encounter with the dietitian and is trying to make better food choices.  She inconsistently checks her blood sugar and reports most of her readings have been less than 200.  She reports compliance with insulin therapy and medication.  Due to COVID-19 she missed her diabetes follow-up here in office.  She denies 3'P and is not experiencing  any new symptoms of neuropathy. Social History   Socioeconomic History  . Marital status: Divorced    Spouse name: Not on file  . Number of children: Not on file  . Years of education: Not on file  . Highest education level: Not on file  Occupational History  . Occupation: Investment banker, operational  . Financial resource strain: Not on file  . Food insecurity    Worry: Not on file    Inability: Not on file  . Transportation  needs    Medical: Not on file    Non-medical: Not on file  Tobacco Use  . Smoking status: Former Smoker    Packs/day: 3.00    Years: 30.00    Pack years: 90.00    Types: Cigarettes    Quit date: 10/24/2011    Years since quitting: 6.8  . Smokeless tobacco: Never Used  Substance and Sexual Activity  . Alcohol use: No  . Drug use: Yes  . Sexual activity: Not Currently  Lifestyle  . Physical activity    Days per week: Not on file    Minutes per session: Not on file  . Stress: Not on file  Relationships  . Social Herbalist on phone: Not on file    Gets together: Not on file    Attends religious service: Not on file    Active member of club or organization: Not on file    Attends meetings of clubs or organizations: Not on file    Relationship status: Not on file  . Intimate partner violence    Fear of current or ex partner: Not on file    Emotionally abused: Not on file    Physically abused: Not on file    Forced sexual activity: Not on file  Other Topics Concern  . 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 Diabetes Mother   . Bladder Cancer Mother 66  smoker  . Other Mother 1       TAH for unspecified reason  . Multiple sclerosis Mother   . Cancer Father 73       lymphatic/tonsil cancer - in remission; former smoker  . COPD Maternal Grandmother        smoker  . Emphysema Maternal Grandmother        smoker  . Stroke Maternal Grandfather   . Dementia Maternal Uncle   . Diabetes Paternal Grandmother   . COPD Maternal Uncle        smoker  . Multiple sclerosis Paternal Aunt   . Breast cancer Paternal Aunt        dx. late 17s - early 9s   Review of Systems Pertinent negatives listed in HPI Patient Active Problem List   Diagnosis Date Noted  . Bone metastasis ([place]) 05/31/2018  . Family history of breast cancer in female 03/13/2015  . Malignant  neoplasm of lower-inner quadrant of right breast of female, estrogen receptor positive ([place]) 02/25/2015  . Diabetes mellitus type 2, uncontrolled, without complications ([place]) 33/29/5188  . HLD (hyperlipidemia) 02/25/2015  . Anxiety state 05/26/2009  . Hyperthyroidism 11/14/2006    Allergies  Allergen Reactions  . Codeine     hives  . Hydrocodone-Acetaminophen     hives  . Meloxicam Other (See Comments)    Abdominal pain   . Naproxen     Cant breath  . Propoxyphene N-Acetaminophen     hives  . Sulfonamide Derivatives     Hives   . Tramadol Other (See Comments)    Abdominal Pain    Prior to Admission medications   Medication Sig Start Date End Date Taking? Authorizing Provider  Accu-Chek FastClix Lancets MISC USE TO TEST BLOOD SUGAR UP TO FOUR TIMES DAILY AS DIRECTED 05/16/18  Yes Scot Jun, FNP  ALPRAZolam Duanne Moron) 0.5 MG tablet TAKE ONE (1) TABLET BY MOUTH DAILY AS NEEDED FOR AXNIETY 06/15/17  Yes Ria Bush, MD  anastrozole (ARIMIDEX) 1 MG tablet Take 1 tablet (1 mg total) by mouth daily. 05/11/18  Yes [name], MD  atorvastatin (LIPITOR) 20 MG tablet Take 1 tablet (20 mg total) by mouth daily. 03/21/18  Yes Scot Jun, FNP  BD INSULIN SYRINGE U/F 31G X 5/16" 0.5 ML MISC USE TO ADMINISTER INSULIN 3 TIMES DAILY WITH MEALS 05/10/18  Yes [provider]  Blood Glucose Monitoring Suppl (ACCU-CHEK GUIDE ME) w/Device KIT by Does not apply route.   Yes [provider]  diclofenac (VOLTAREN) 75 MG EC tablet TAKE 1 TABLET BY MOUTH TWICE A DAY WITH FOOD AS NEEDED 04/09/18  Yes [provider]  glucose blood (ACCU-CHEK GUIDE) test strip Use to check FSBS up to 4 times a day. Dx: E11.65 05/23/18  Yes Scot Jun, FNP  Insulin Glargine (LANTUS SOLOSTAR) 100 UNIT/ML Solostar Pen Inject 20 Units into the skin at bedtime. 05/10/18  Yes Scot Jun, FNP  Insulin Pen Needle (NOVOFINE) 30G X 8 MM MISC Use once at night to inject  insulin. E.11.9 03/17/18  Yes Scot Jun, FNP  insulin regular (HUMULIN R) 100 units/mL injection Inject 0.1 mLs (10 Units total) into the skin 3 (three) times daily before meals. E.11.9. Hold dose for blood sugar less than 125 05/10/18  Yes Scot Jun, FNP  metoprolol succinate (TOPROL XL) 25 MG 24 hr tablet Take 1 tablet (25 mg total) by mouth daily. 03/15/18  Yes [name], MD    Past  Medical, Surgical Family and Social History reviewed and updated.    Objective:   Today's Vitals   08/14/18 1547  BP: 129/88  Pulse: 96  Resp: 17  Temp: 97.7 F (36.5 C)  TempSrc: Temporal  SpO2: 95%  Weight: 161 lb 3.2 oz (73.1 kg)  Height: _0  (1.499 m)    Wt Readings from Last 3 Encounters:  08/14/18 161 lb 3.2 oz (73.1 kg)  05/31/18 156 lb 6.4 oz (70.9 kg)  05/11/18 156 lb 3.2 oz (70.9 kg)     Physical Exam General appearance: alert, well developed, well nourished, cooperative and in no distress Head: Normocephalic, without obvious abnormality, atraumatic Respiratory: Respirations even and unlabored, normal respiratory rate Heart: rate and rhythm normal. No gallop or murmurs noted on exam  Extremities: No gross deformities Skin: Skin color, texture, turgor normal. No rashes seen  Psych: Appropriate mood and affect. Neurologic: Mental status: Alert, oriented to person, place, and time, thought content appropriate.  Lab Results  Component Value Date   POCGLU 200 (A) 05/10/2018   POCGLU 182 (A) 03/16/2018   POCGLU 155 (A) 03/19/2015    Lab Results  Component Value Date   HGBA1C 9.5 (H) 05/10/2018       Assessment & Plan:  1. Dysuria UA is negative.  If symptoms persist advised patient to follow-up - POCT URINALYSIS DIP (CLINITEK)  2. Vaginitis and vulvovaginitis Patient has a history of recurrent yeast infections secondary to diabetes.  Will empirically treat her with Diflucan while vaginal specimen is pending.   - Cervicovaginal ancillary only  3.  Diabetes mellitus type 2, uncontrolled, without complications ([place]) History of very poor control.  Last A1c is 9.5 back in March.  Patient has attended diabetes education and has made efforts to improve diet since her last visit.  Will check a repeat A1c and titrate insulin as needed to improve A1c.  Only encourage routine physical activity to reduce weight and improve glycemic control. - Glucose (CBG), Fasting - Hemoglobin A1c - Comprehensive metabolic panel  Return for follow-up in 3 months, for diabetes management.    -The patient was given clear instructions to go to ER or return to medical center if symptoms do not improve, worsen or new problems develop. The patient verbalized understanding.    Molli Barrows, FNP Primary Care at [facility] [address] [place] [phone]: [phone]

## 2018-08-14 NOTE — Patient Instructions (Signed)
Vaginitis    Vaginitis is irritation and swelling (inflammation) of the vagina. It happens when normal bacteria and yeast in the vagina grow too much. There are many types of this condition. Treatment will depend on the type you have.  Follow these instructions at home:  Lifestyle  · Keep your vagina area clean and dry.  ? Avoid using soap.  ? Rinse the area with water.  · Do not do the following until your doctor says it is okay:  ? Wash and clean out the vagina (douche).  ? Use tampons.  ? Have sex.  · Wipe from front to back after going to the bathroom.  · Let air reach your vagina.  ? Wear cotton underwear.  ? Do not wear:  ? Underwear while you sleep.  ? Tight pants.  ? Thong underwear.  ? Underwear or nylons without a cotton panel.  ? Take off any wet clothing, such as bathing suits, as soon as possible.  · Use gentle, non-scented products. Do not use things that can irritate the vagina, such as fabric softeners. Avoid the following products if they are scented:  ? Feminine sprays.  ? Detergents.  ? Tampons.  ? Feminine hygiene products.  ? Soaps or bubble baths.  · Practice safe sex and use condoms.  General instructions  · Take over-the-counter and prescription medicines only as told by your doctor.  · If you were prescribed an antibiotic medicine, take or use it as told by your doctor. Do not stop taking or using the antibiotic even if you start to feel better.  · Keep all follow-up visits as told by your doctor. This is important.  Contact a doctor if:  · You have pain in your belly.  · You have a fever.  · Your symptoms last for more than 2-3 days.  Get help right away if:  · You have a fever and your symptoms get worse all of a sudden.  Summary  · Vaginitis is irritation and swelling of the vagina. It can happen when the normal bacteria and yeast in the vagina grow too much. There are many types.  · Treatment will depend on the type you have.  · Do not douche, use tampons , or have sex until your health  care provider approves. When you can return to sex, practice safe sex and use condoms.  This information is not intended to replace advice given to you by your health care provider. Make sure you discuss any questions you have with your health care provider.  Document Released: 05/07/2008 Document Revised: 03/02/2016 Document Reviewed: 03/02/2016  Elsevier Interactive Patient Education © 2019 Elsevier Inc.

## 2018-08-15 LAB — COMPREHENSIVE METABOLIC PANEL
ALT: 34 IU/L — ABNORMAL HIGH (ref 0–32)
AST: 26 IU/L (ref 0–40)
Albumin/Globulin Ratio: 1.4 (ref 1.2–2.2)
Albumin: 4.1 g/dL (ref 3.8–4.9)
Alkaline Phosphatase: 182 IU/L — ABNORMAL HIGH (ref 39–117)
BUN/Creatinine Ratio: 29 — ABNORMAL HIGH (ref 9–23)
BUN: 21 mg/dL (ref 6–24)
Bilirubin Total: 0.2 mg/dL (ref 0.0–1.2)
CO2: 22 mmol/L (ref 20–29)
Calcium: 9.9 mg/dL (ref 8.7–10.2)
Chloride: 98 mmol/L (ref 96–106)
Creatinine, Ser: 0.72 mg/dL (ref 0.57–1.00)
GFR calc Af Amer: 111 mL/min/{1.73_m2} (ref >59)
GFR calc non Af Amer: 97 mL/min/{1.73_m2} (ref >59)
Globulin, Total: 2.9 g/dL (ref 1.5–4.5)
Glucose: 118 mg/dL — ABNORMAL HIGH (ref 65–99)
Potassium: 4.3 mmol/L (ref 3.5–5.2)
Sodium: 136 mmol/L (ref 134–144)
Total Protein: 7 g/dL (ref 6.0–8.5)

## 2018-08-15 LAB — CERVICOVAGINAL ANCILLARY ONLY
Bacterial vaginitis: NEGATIVE
Candida vaginitis: NEGATIVE
Chlamydia: NEGATIVE
Neisseria Gonorrhea: NEGATIVE
Trichomonas: NEGATIVE

## 2018-08-15 LAB — HEMOGLOBIN A1C
Est. average glucose Bld gHb Est-mCnc: 189 mg/dL
Hgb A1c MFr Bld: 8.2 % — ABNORMAL HIGH (ref 4.8–5.6)

## 2018-08-17 ENCOUNTER — Telehealth: Payer: Self-pay | Admitting: Family Medicine

## 2018-08-17 MED ORDER — LANTUS SOLOSTAR 100 UNIT/ML ~~LOC~~ SOPN: 30.0000 [IU] | PEN_INJECTOR | Freq: Every day | SUBCUTANEOUS | 3 refills | Status: DC

## 2018-08-17 NOTE — Telephone Encounter (Signed)
Contact patient to advise of the following: A1c 8.2 not at goal-increase Lantus 30 units daily at bedtime. Vaginal specimen is normal. Renal function normal. Keep 3 month follow-up

## 2018-08-18 NOTE — Telephone Encounter (Signed)
Patient notified of lab results & recommendations. Expressed understanding. Repeated back adjusted Lantus dose correctly.

## 2018-08-24 ENCOUNTER — Telehealth: Payer: Self-pay

## 2018-08-24 ENCOUNTER — Telehealth: Payer: Self-pay | Admitting: Family Medicine

## 2018-08-24 NOTE — Telephone Encounter (Signed)
I called primary [facility] for Molli Barrows FNP and spoke with Juline Patch. Sending this note to [employer].

## 2018-08-24 NOTE — Assessment & Plan Note (Signed)
Right breast irregular mass lower inner quadrant retroareolar 02/19/2015: 2.2 x 1.3 x 1.7 cm with architectural distortion and skin/nipple retraction, T2 N0 stage II a clinical stage, additional benign cysts largest 1.2 cm Right breast biopsy 02/20/2015 5:00: Invasive ductal carcinoma grade 2, perineural invasion present, ER 90%, PR 70%, HER-2 negative ratio 0.95, Ki-67 15%   Breast MRI 03/07/2015: Subareolar mass 2.2 x 2.4 x 2.5 cm extending into the retracted nipple, extending inferiorly and laterally over a distance of 4 cm, several level I right axillary lymph nodes with cortical thickening up to 7 mm with a maximum diameter of 15 mm --------------------------------------------------------------------- Patient took anastrozole for a brief time and then discontinued it. She has not come back to [place] in a long time. (There were multiple issues regarding the fact that she lost Medicaid and multiple family issues)  CT CAP and bone scan: 05/23/2018: 2.9 cm spiculated soft tissue density central right breast, no evidence of soft tissue metastatic disease within the chest abdomen pelvis.  Subcentimeter sclerotic bone lesions T10, left pubis, right posterior ilium Bone scan: Foci of uptake left frontal calvarium, T-spine, right ilium  Treatment plan: Ibrance with anastrozole Leslee Home will be added in 3 months after the current corona virus pandemic subsides)  Anastrozole toxicities: Nausea: Patient is trying to take ginger to curb the nausea.  If her symptoms do not get better then we can switch her to letrozole.  Ibrance toxicities:   Bone metastases: Recommended Xgeva with calcium and vitamin D Return to clinic in 1 month for follow-up

## 2018-08-24 NOTE — Telephone Encounter (Signed)
[place] Night - Client Nonclinical Telephone Record AccessNurse Client [place] Night - Client Client Site [place] Physician Webb Silversmith - NP Contact Type Call Who Is Calling Patient / Member / Family / Caregiver Caller Name [place] Phone Number [phone] Patient Name Emily Wilson Patient DOB [birth year] Call Type Message Only Information Provided Reason for Call Request for General Office Information Initial Comment Caller states she requested a copy of her medical records. She found something on there that wasn't true. Drug test results are not in her file. She needs a return call back. Additional Comment She is very upset and want's to talk to someone about medical records. Please call ASAP. Call Closed By: Artis Flock Transaction Date/Time: 08/24/2018 7:26:54 AM (ET)

## 2018-08-29 NOTE — Telephone Encounter (Signed)
Patient came in to the office.  She had requested records and on the records it showed that she takes drugs. She said it's from when she was seeing [facility].  Patient is requesting the 3 drug screenings that were done and an explanation for why it says she takes drugs. Patient can be reached at [phone].  Patient filled out records release  and it's on Lynn's desk. Patient would like a call back to discuss this. She's aware she too Xanax, but it was prescribed.

## 2018-08-30 NOTE — Telephone Encounter (Signed)
I reviewed the chart. No history of drug use. I bet what happened is during her initial new patient appt, when being checked in, the yes button was accidentally hit instead of no, and it carried through on all future encounters. I am not aware of any drug use other than Xanax prn

## 2018-08-30 NOTE — Progress Notes (Signed)
Patient Care Team: Scot Jun, FNP as PCP - General (Family Medicine)  DIAGNOSIS:    ICD-10-CM   1. Malignant neoplasm of lower-inner quadrant of right breast of female, estrogen receptor positive ([place])  C50.311    Z17.0     SUMMARY OF ONCOLOGIC HISTORY: Oncology History  Malignant neoplasm of lower-inner quadrant of right breast of female, estrogen receptor positive ([place])  02/19/2015 Mammogram   Right breast irregular mass lower inner quadrant 2.2 x 1.3 x 1.7 cm with architectural distortion and skin/nipple retraction, T2 N0 stage II a clinical stage, additional benign cysts largest 1.2 cm   02/20/2015 Initial Diagnosis   Right breast biopsy 5:00: Invasive ductal carcinoma grade 2, perineural invasion present, ER 90%, PR 70%, HER-2 negative ratio 0.95, Ki-67 15%    03/07/2015 Breast MRI   right breast inferior subareolar mass measuring 2.2 x 2.4 x 2.5 cm with skin and nipple enhancement, extending inferiorly and laterally is intraductal enhancement over a distance of 4 cm, several level I right axillary lymph nodes with cortical thickening   03/13/2015 -  Anti-estrogen oral therapy   Neoadjuvant antiestrogen therapy with tamoxifen 20 mg daily (because the patient did not want to undergo surgery immediately for multiple family reasons) stopped in late 2017; anastrozole started 05/11/18   05/23/2018 Imaging   CT CAP: 2.9 cm spiculated soft tissue density central right breast, no evidence of soft tissue metastatic disease within the chest abdomen pelvis.  Subcentimeter sclerotic bone lesions T10, left pubis, right posterior ilium Bone scan: Foci of uptake left frontal calvarium, T-spine, right ilium     CHIEF COMPLIANT: Follow-up of metastaic breast cancer on anastrozole to begin Ibrance  INTERVAL HISTORY: Emily Wilson is a 53 y.o. with above-mentioned history of metastatic breast cancer currently on anastrozole. She presents to the clinic today to begin [place].  She  tells me that she has been tolerating anastrozole moderately well.  She thinks that the combination of anastrozole and atorvastatin is causing interactions and she wants [place] to change anastrozole.  She is very anxious about starting Ibrance because of all the side effects of Ibrance.  She would like to stay on the antiestrogen therapy for 3 more months and repeat a scan and see how she is doing.  If there is evidence of progression of disease then she will take the [place].  She is also worried about her bones and we discussed about Xgeva as well today.  REVIEW OF SYSTEMS:   Constitutional: Denies fevers, chills or abnormal weight loss Eyes: Denies blurriness of vision Ears, nose, mouth, throat, and face: Denies mucositis or sore throat Respiratory: Denies cough, dyspnea or wheezes Cardiovascular: Denies palpitation, chest discomfort Gastrointestinal: Denies nausea, heartburn or change in bowel habits Skin: Denies abnormal skin rashes Lymphatics: Denies new lymphadenopathy or easy bruising Neurological: Denies numbness, tingling or new weaknesses Behavioral/Psych: Mood is stable, no new changes  Extremities: No lower extremity edema Breast: denies any pain or lumps or nodules in either breasts All other systems were reviewed with the patient and are negative.  I have reviewed the past medical history, past surgical history, social history and family history with the patient and they are unchanged from previous note.  ALLERGIES:  is allergic to codeine; hydrocodone-acetaminophen; meloxicam; naproxen; propoxyphene n-acetaminophen; sulfonamide derivatives; and tramadol.  MEDICATIONS:  Current Outpatient Medications  Medication Sig Dispense Refill   Accu-Chek FastClix Lancets MISC USE TO TEST BLOOD SUGAR UP TO FOUR TIMES DAILY AS DIRECTED 204 each 3  anastrozole (ARIMIDEX) 1 MG tablet Take 1 tablet (1 mg total) by mouth daily. 90 tablet 3   atorvastatin (LIPITOR) 20 MG tablet Take 1 tablet  (20 mg total) by mouth daily. 90 tablet 3   BD INSULIN SYRINGE U/F 31G X 5/16" 0.5 ML MISC USE TO ADMINISTER INSULIN 3 TIMES DAILY WITH MEALS     Blood Glucose Monitoring Suppl (ACCU-CHEK GUIDE ME) w/Device KIT by Does not apply route.     diclofenac (VOLTAREN) 75 MG EC tablet TAKE 1 TABLET BY MOUTH TWICE A DAY WITH FOOD AS NEEDED     glucose blood (ACCU-CHEK GUIDE) test strip Use to check FSBS up to 4 times a day. Dx: E11.65 200 each 2   Insulin Glargine (LANTUS SOLOSTAR) 100 UNIT/ML Solostar Pen Inject 30 Units into the skin at bedtime. 5 pen 3   Insulin Pen Needle (NOVOFINE) 30G X 8 MM MISC Use once at night to inject insulin. E.11.9 100 each 3   insulin regular (HUMULIN R) 100 units/mL injection Inject 0.1 mLs (10 Units total) into the skin 3 (three) times daily before meals. E.11.9. Hold dose for blood sugar less than 125 10 mL 11   metoprolol succinate (TOPROL XL) 25 MG 24 hr tablet Take 1 tablet (25 mg total) by mouth daily. 90 tablet 3   No current facility-administered medications for this visit.     PHYSICAL EXAMINATION: ECOG PERFORMANCE STATUS: 1 - Symptomatic but completely ambulatory  Vitals:   08/31/18 1151  BP: 127/83  Pulse: 85  Resp: 18  Temp: 99.1 F (37.3 C)  SpO2: 98%   Filed Weights   08/31/18 1151  Weight: 161 lb 8 oz (73.3 kg)    GENERAL: alert, no distress and comfortable SKIN: skin color, texture, turgor are normal, no rashes or significant lesions EYES: normal, Conjunctiva are pink and non-injected, sclera clear OROPHARYNX: no exudate, no erythema and lips, buccal mucosa, and tongue normal  NECK: supple, thyroid normal size, non-tender, without nodularity LYMPH: no palpable lymphadenopathy in the cervical, axillary or inguinal LUNGS: clear to auscultation and percussion with normal breathing effort HEART: regular rate & rhythm and no murmurs and no lower extremity edema ABDOMEN: abdomen soft, non-tender and normal bowel sounds MUSCULOSKELETAL:  no cyanosis of digits and no clubbing  NEURO: alert & oriented x 3 with fluent speech, no focal motor/sensory deficits EXTREMITIES: No lower extremity edema  LABORATORY DATA:  I have reviewed the data as listed CMP Latest Ref Rng & Units 08/14/2018 05/10/2018 03/16/2018  Glucose 65 - 99 mg/dL 118(H) 161(H) 140(H)  BUN 6 - 24 mg/dL '21 13 9  ' Creatinine 0.57 - 1.00 mg/dL 0.72 0.68 0.73  Sodium 134 - 144 mmol/L 136 142 135  Potassium 3.5 - 5.2 mmol/L 4.3 4.4 4.3  Chloride 96 - 106 mmol/L 98 104 97  CO2 20 - 29 mmol/L '22 20 21  ' Calcium 8.7 - 10.2 mg/dL 9.9 10.3(H) 9.9  Total Protein 6.0 - 8.5 g/dL 7.0 7.5 6.7  Total Bilirubin 0.0 - 1.2 mg/dL 0.2 0.3 0.7  Alkaline Phos 39 - 117 IU/L 182(H) 155(H) 128(H)  AST 0 - 40 IU/L '26 22 20  ' ALT 0 - 32 IU/L 34(H) 30 27    Lab Results  Component Value Date   WBC 7.7 08/31/2018   HGB 14.8 08/31/2018   HCT 44.0 08/31/2018   MCV 93.2 08/31/2018   PLT 330 08/31/2018   NEUTROABS 3.4 08/31/2018    ASSESSMENT & PLAN:  Malignant neoplasm of lower-inner quadrant  of right breast of female, estrogen receptor positive ([place]) Right breast irregular mass lower inner quadrant retroareolar 02/19/2015: 2.2 x 1.3 x 1.7 cm with architectural distortion and skin/nipple retraction, T2 N0 stage II a clinical stage, additional benign cysts largest 1.2 cm Right breast biopsy 02/20/2015 5:00: Invasive ductal carcinoma grade 2, perineural invasion present, ER 90%, PR 70%, HER-2 negative ratio 0.95, Ki-67 15%   Breast MRI 03/07/2015: Subareolar mass 2.2 x 2.4 x 2.5 cm extending into the retracted nipple, extending inferiorly and laterally over a distance of 4 cm, several level I right axillary lymph nodes with cortical thickening up to 7 mm with a maximum diameter of 15 mm --------------------------------------------------------------------- Patient took anastrozole for a brief time and then discontinued it. She has not come back to [place] in a long time. (There were multiple  issues regarding the fact that she lost Medicaid and multiple family issues)  CT CAP and bone scan: 05/23/2018: 2.9 cm spiculated soft tissue density central right breast, no evidence of soft tissue metastatic disease within the chest abdomen pelvis.  Subcentimeter sclerotic bone lesions T10, left pubis, right posterior ilium Bone scan: Foci of uptake left frontal calvarium, T-spine, right ilium  Treatment plan:  1.  Switched to letrozole because of concern of interaction of atorvastatin with anastrozole 2.  Hold off on starting Ibrance at this time. 3.  CT chest abdomen pelvis and bone scan in 3 months.  If there is evidence of progression of disease then we will add Ibrance.  Anastrozole toxicities: Nausea: Patient is trying to take ginger to curb the nausea.  If her symptoms do not get better then we can switch her to letrozole.  Bone metastases: Recommended Xgeva with calcium and vitamin D She will come back in 1 week for Xgeva.  We will see her back in 3 months after scans labs and follow-up.    No orders of the defined types were placed in this encounter.  The patient has a good understanding of the overall plan. she agrees with it. she will call with any problems that may develop before the next visit here.  [name], MD 08/31/2018  Julious Oka Dorshimer am acting as scribe for Dr. [name].  I have reviewed the above documentation for accuracy and completeness, and I agree with the above.

## 2018-08-31 ENCOUNTER — Other Ambulatory Visit: Payer: Self-pay

## 2018-08-31 ENCOUNTER — Inpatient Hospital Stay (HOSPITAL_BASED_OUTPATIENT_CLINIC_OR_DEPARTMENT_OTHER): Payer: Medicaid Other | Admitting: Hematology and Oncology

## 2018-08-31 ENCOUNTER — Inpatient Hospital Stay: Payer: Medicaid Other | Attending: Hematology and Oncology

## 2018-08-31 DIAGNOSIS — Z79899 Other long term (current) drug therapy: Secondary | ICD-10-CM | POA: Insufficient documentation

## 2018-08-31 DIAGNOSIS — C50311 Malignant neoplasm of lower-inner quadrant of right female breast: Secondary | ICD-10-CM | POA: Diagnosis not present

## 2018-08-31 DIAGNOSIS — Z170 Estrogen receptor positive status [ER+]: Secondary | ICD-10-CM | POA: Insufficient documentation

## 2018-08-31 DIAGNOSIS — C7951 Secondary malignant neoplasm of bone: Secondary | ICD-10-CM

## 2018-08-31 DIAGNOSIS — R110 Nausea: Secondary | ICD-10-CM

## 2018-08-31 DIAGNOSIS — Z79811 Long term (current) use of aromatase inhibitors: Secondary | ICD-10-CM | POA: Diagnosis not present

## 2018-08-31 LAB — CBC WITH DIFFERENTIAL (CANCER CENTER ONLY)
Abs Immature Granulocytes: 0.04 10*3/uL (ref 0.00–0.07)
Basophils Absolute: 0.1 10*3/uL (ref 0.0–0.1)
Basophils Relative: 1 %
Eosinophils Absolute: 0.3 10*3/uL (ref 0.0–0.5)
Eosinophils Relative: 4 %
HCT: 44 % (ref 36.0–46.0)
Hemoglobin: 14.8 g/dL (ref 12.0–15.0)
Immature Granulocytes: 1 %
Lymphocytes Relative: 39 %
Lymphs Abs: 3 10*3/uL (ref 0.7–4.0)
MCH: 31.4 pg (ref 26.0–34.0)
MCHC: 33.6 g/dL (ref 30.0–36.0)
MCV: 93.2 fL (ref 80.0–100.0)
Monocytes Absolute: 0.8 10*3/uL (ref 0.1–1.0)
Monocytes Relative: 11 %
Neutro Abs: 3.4 10*3/uL (ref 1.7–7.7)
Neutrophils Relative %: 44 %
Platelet Count: 330 10*3/uL (ref 150–400)
RBC: 4.72 MIL/uL (ref 3.87–5.11)
RDW: 11.9 % (ref 11.5–15.5)
WBC Count: 7.7 10*3/uL (ref 4.0–10.5)
nRBC: 0 % (ref 0.0–0.2)

## 2018-08-31 MED ORDER — LETROZOLE 2.5 MG PO TABS: 2.5000 mg | ORAL_TABLET | Freq: Every day | ORAL | 3 refills | Status: DC

## 2018-09-04 ENCOUNTER — Other Ambulatory Visit: Payer: Self-pay | Admitting: Surgery

## 2018-09-04 DIAGNOSIS — C50911 Malignant neoplasm of unspecified site of right female breast: Secondary | ICD-10-CM | POA: Diagnosis not present

## 2018-09-05 ENCOUNTER — Telehealth: Payer: Self-pay | Admitting: Hematology and Oncology

## 2018-09-05 NOTE — Telephone Encounter (Signed)
Mailed medical records from 02/23/15 to present per patient to address of [address], [place] [address]

## 2018-09-06 ENCOUNTER — Encounter: Payer: Self-pay | Admitting: Internal Medicine

## 2018-09-06 NOTE — Telephone Encounter (Addendum)
Reviewed and discussed this with CMA to best support documentation in respect to HIPPAof this situation for patient.  CMA to review with provider.

## 2018-09-06 NOTE — Telephone Encounter (Signed)
Letter printed, stamped and placed in outgoing mail.  Nothing further needed.

## 2018-09-06 NOTE — Telephone Encounter (Signed)
Leafy Ro, is there a good way to print this documentation showing updated Social History? I am able to print the [place] History which shows the most recently updated Drug Use History per patient request but It also shows multiple employee entries - not sure if this would be a breech of privacy as far as medical documentation goes.

## 2018-09-06 NOTE — Telephone Encounter (Signed)
Letter completed. Please print, stamp and place up front for pt to pick up.

## 2018-09-06 NOTE — Telephone Encounter (Signed)
Discussed with Rollene Fare writing a letter for the patient stating this information which has been updated in Epic.  [name] requests note to be sent back to her so that she can construct the letter.

## 2018-09-07 ENCOUNTER — Other Ambulatory Visit: Payer: Self-pay

## 2018-09-07 ENCOUNTER — Inpatient Hospital Stay: Payer: Medicaid Other

## 2018-09-07 VITALS — BP 128/72 | HR 82 | Temp 98.7°F | Resp 18

## 2018-09-07 DIAGNOSIS — C50311 Malignant neoplasm of lower-inner quadrant of right female breast: Secondary | ICD-10-CM | POA: Diagnosis not present

## 2018-09-07 DIAGNOSIS — Z170 Estrogen receptor positive status [ER+]: Secondary | ICD-10-CM | POA: Diagnosis not present

## 2018-09-07 DIAGNOSIS — Z79811 Long term (current) use of aromatase inhibitors: Secondary | ICD-10-CM | POA: Diagnosis not present

## 2018-09-07 DIAGNOSIS — C7951 Secondary malignant neoplasm of bone: Secondary | ICD-10-CM

## 2018-09-07 DIAGNOSIS — Z79899 Other long term (current) drug therapy: Secondary | ICD-10-CM | POA: Diagnosis not present

## 2018-09-07 DIAGNOSIS — R110 Nausea: Secondary | ICD-10-CM | POA: Diagnosis not present

## 2018-09-07 MED ORDER — DENOSUMAB 120 MG/1.7ML ~~LOC~~ SOLN
120.0000 mg | Freq: Once | SUBCUTANEOUS | Status: AC
  Administered 2018-09-07: 120 mg via SUBCUTANEOUS

## 2018-09-07 MED ORDER — DENOSUMAB 120 MG/1.7ML ~~LOC~~ SOLN
SUBCUTANEOUS | Status: AC
  Filled 2018-09-07: qty 1.7

## 2018-09-07 NOTE — Patient Instructions (Signed)
Denosumab injection What is this medicine? DENOSUMAB (den oh sue mab) slows bone breakdown. Prolia is used to treat osteoporosis in women after menopause and in men, and in people who are taking corticosteroids for 6 months or more. Delton See is used to treat a high calcium level due to cancer and to prevent bone fractures and other bone problems caused by multiple myeloma or cancer bone metastases. Delton See is also used to treat giant cell tumor of the bone. This medicine may be used for other purposes; ask your health care provider or pharmacist if you have questions. COMMON BRAND NAME(S): Prolia, XGEVA What should I tell my health care provider before I take this medicine? They need to know if you have any of these conditions:  dental disease  having surgery or tooth extraction  infection  kidney disease  low levels of calcium or Vitamin D in the blood  malnutrition  on hemodialysis  skin conditions or sensitivity  thyroid or parathyroid disease  an unusual reaction to denosumab, other medicines, foods, dyes, or preservatives  pregnant or trying to get pregnant  breast-feeding How should I use this medicine? This medicine is for injection under the skin. It is given by a health care professional in a hospital or clinic setting. A special MedGuide will be given to you before each treatment. Be sure to read this information carefully each time. For Prolia, talk to your pediatrician regarding the use of this medicine in children. Special care may be needed. For Delton See, talk to your pediatrician regarding the use of this medicine in children. While this drug may be prescribed for children as young as 13 years for selected conditions, precautions do apply. Overdosage: If you think you have taken too much of this medicine contact a poison control center or emergency room at once. NOTE: This medicine is only for you. Do not share this medicine with others. What if I miss a dose? It is  important not to miss your dose. Call your doctor or health care professional if you are unable to keep an appointment. What may interact with this medicine? Do not take this medicine with any of the following medications:  other medicines containing denosumab This medicine may also interact with the following medications:  medicines that lower your chance of fighting infection  steroid medicines like prednisone or cortisone This list may not describe all possible interactions. Give your health care provider a list of all the medicines, herbs, non-prescription drugs, or dietary supplements you use. Also tell them if you smoke, drink alcohol, or use illegal drugs. Some items may interact with your medicine. What should I watch for while using this medicine? Visit your doctor or health care professional for regular checks on your progress. Your doctor or health care professional may order blood tests and other tests to see how you are doing. Call your doctor or health care professional for advice if you get a fever, chills or sore throat, or other symptoms of a cold or flu. Do not treat yourself. This drug may decrease your body's ability to fight infection. Try to avoid being around people who are sick. You should make sure you get enough calcium and vitamin D while you are taking this medicine, unless your doctor tells you not to. Discuss the foods you eat and the vitamins you take with your health care professional. See your dentist regularly. Brush and floss your teeth as directed. Before you have any dental work done, tell your dentist you are  receiving this medicine. Do not become pregnant while taking this medicine or for 5 months after stopping it. Talk with your doctor or health care professional about your birth control options while taking this medicine. Women should inform their doctor if they wish to become pregnant or think they might be pregnant. There is a potential for serious side  effects to an unborn child. Talk to your health care professional or pharmacist for more information. What side effects may I notice from receiving this medicine? Side effects that you should report to your doctor or health care professional as soon as possible:  allergic reactions like skin rash, itching or hives, swelling of the face, lips, or tongue  bone pain  breathing problems  dizziness  jaw pain, especially after dental work  redness, blistering, peeling of the skin  signs and symptoms of infection like fever or chills; cough; sore throat; pain or trouble passing urine  signs of low calcium like fast heartbeat, muscle cramps or muscle pain; pain, tingling, numbness in the hands or feet; seizures  unusual bleeding or bruising  unusually weak or tired Side effects that usually do not require medical attention (report to your doctor or health care professional if they continue or are bothersome):  constipation  diarrhea  headache  joint pain  loss of appetite  muscle pain  runny nose  tiredness  upset stomach This list may not describe all possible side effects. Call your doctor for medical advice about side effects. You may report side effects to FDA at 1-800-FDA-1088. Where should I keep my medicine? This medicine is only given in a clinic, doctor's office, or other health care setting and will not be stored at home. NOTE: This sheet is a summary. It may not cover all possible information. If you have questions about this medicine, talk to your doctor, pharmacist, or health care provider.  2020 Elsevier/Gold Standard (2017-06-17 16:10:44)

## 2018-09-07 NOTE — Progress Notes (Signed)
Ok to give Xgeva injection today with 6/22 CMET results per Dr. Lindi Adie

## 2018-09-12 ENCOUNTER — Telehealth: Payer: Self-pay | Admitting: *Deleted

## 2018-09-12 NOTE — Telephone Encounter (Signed)
Received call from pt stating she has been experiencing increase all over bone pain.  Pt states her pain in her scapula area has intensified.  Per Dr. Lindi Adie, pt to have a bone scan next week with f/u in office to discuss results.  Bone Scan scheduled for 09/20/2018 with f/u with Dr. Lindi Adie on 09/22/2018.  Pt notified of apts and verbalized understanding.

## 2018-09-18 ENCOUNTER — Other Ambulatory Visit: Payer: Self-pay | Admitting: Family Medicine

## 2018-09-20 ENCOUNTER — Encounter (HOSPITAL_COMMUNITY)
Admission: RE | Admit: 2018-09-20 | Discharge: 2018-09-20 | Disposition: A | Discharge status: Home / Self Care | Payer: Medicaid Other | Source: Ambulatory Visit | Attending: Hematology and Oncology | Admitting: Hematology and Oncology

## 2018-09-20 ENCOUNTER — Encounter (HOSPITAL_COMMUNITY): Admission: RE | Admit: 2018-09-20 | Payer: Medicaid Other | Source: Ambulatory Visit

## 2018-09-20 ENCOUNTER — Ambulatory Visit (HOSPITAL_COMMUNITY)
Admission: RE | Admit: 2018-09-20 | Discharge: 2018-09-20 | Disposition: A | Discharge status: Home / Self Care | Payer: Medicaid Other | Source: Ambulatory Visit | Attending: Hematology and Oncology | Admitting: Hematology and Oncology

## 2018-09-20 ENCOUNTER — Other Ambulatory Visit: Payer: Self-pay

## 2018-09-20 ENCOUNTER — Ambulatory Visit (HOSPITAL_COMMUNITY): Admission: RE | Admit: 2018-09-20 | Payer: Medicaid Other | Source: Ambulatory Visit

## 2018-09-20 DIAGNOSIS — C50911 Malignant neoplasm of unspecified site of right female breast: Secondary | ICD-10-CM | POA: Diagnosis not present

## 2018-09-20 DIAGNOSIS — Z170 Estrogen receptor positive status [ER+]: Secondary | ICD-10-CM | POA: Diagnosis not present

## 2018-09-20 DIAGNOSIS — C50311 Malignant neoplasm of lower-inner quadrant of right female breast: Secondary | ICD-10-CM | POA: Insufficient documentation

## 2018-09-20 DIAGNOSIS — C7951 Secondary malignant neoplasm of bone: Secondary | ICD-10-CM | POA: Diagnosis not present

## 2018-09-20 MED ORDER — TECHNETIUM TC 99M MEDRONATE IV KIT
18.1000 | PACK | Freq: Once | INTRAVENOUS | Status: AC | PRN
  Administered 2018-09-20: 18.1 via INTRAVENOUS

## 2018-09-21 NOTE — Progress Notes (Signed)
Patient Care Team: Scot Jun, FNP as PCP - General (Family Medicine)  DIAGNOSIS:    ICD-10-CM   1. Bone metastasis ([place])  C79.51 CBC with Differential ([place])    CMP ([place] only)  2. Malignant neoplasm of lower-inner quadrant of right breast of female, estrogen receptor positive ([place])  C50.311 CBC with Differential ([place])   Z17.0 [place] ([place] only)    SUMMARY OF ONCOLOGIC HISTORY: Oncology History  Malignant neoplasm of lower-inner quadrant of right breast of female, estrogen receptor positive ([place])  02/19/2015 Mammogram   Right breast irregular mass lower inner quadrant 2.2 x 1.3 x 1.7 cm with architectural distortion and skin/nipple retraction, T2 N0 stage II a clinical stage, additional benign cysts largest 1.2 cm   02/20/2015 Initial Diagnosis   Right breast biopsy 5:00: Invasive ductal carcinoma grade 2, perineural invasion present, ER 90%, PR 70%, HER-2 negative ratio 0.95, Ki-67 15%    03/07/2015 Breast MRI   right breast inferior subareolar mass measuring 2.2 x 2.4 x 2.5 cm with skin and nipple enhancement, extending inferiorly and laterally is intraductal enhancement over a distance of 4 cm, several level I right axillary lymph nodes with cortical thickening   03/13/2015 -  Anti-estrogen oral therapy   Neoadjuvant antiestrogen therapy with tamoxifen 20 mg daily (because the patient did not want to undergo surgery immediately for multiple family reasons) stopped in late 2017; anastrozole started 05/11/18   05/23/2018 Imaging   CT CAP: 2.9 cm spiculated soft tissue density central right breast, no evidence of soft tissue metastatic disease within the chest abdomen pelvis.  Subcentimeter sclerotic bone lesions T10, left pubis, right posterior ilium Bone scan: Foci of uptake left frontal calvarium, T-spine, right ilium   09/22/2018 -  Anti-estrogen oral therapy   Ibrance with anastrozole     CHIEF COMPLIANT: Follow-up of  metastaic breast cancer on anastrozole to begin Ibrance  INTERVAL HISTORY: Emily Wilson is a 53 y.o. with above-mentioned history of metastatic breast cancer currently on anastrozole. Bone scan on 09/20/18 showed progression of osseous metastases and suspicious new bone metastases at the right lesser trochanter and at the mid left femoral diaphysis. She presents to the clinic today to review her recent scan and discuss initiation of Ibrance.  She is planning to go on a beach vacation next week.  REVIEW OF SYSTEMS:   Constitutional: Denies fevers, chills or abnormal weight loss Eyes: Denies blurriness of vision Ears, nose, mouth, throat, and face: Denies mucositis or sore throat Respiratory: Denies cough, dyspnea or wheezes Cardiovascular: Denies palpitation, chest discomfort Gastrointestinal: Denies nausea, heartburn or change in bowel habits Skin: Denies abnormal skin rashes Lymphatics: Denies new lymphadenopathy or easy bruising Neurological: Denies numbness, tingling or new weaknesses Behavioral/Psych: Mood is stable, no new changes  Extremities: No lower extremity edema Breast: denies any pain or lumps or nodules in either breasts All other systems were reviewed with the patient and are negative.  I have reviewed the past medical history, past surgical history, social history and family history with the patient and they are unchanged from previous note.  ALLERGIES:  is allergic to codeine; hydrocodone-acetaminophen; meloxicam; naproxen; propoxyphene n-acetaminophen; sulfonamide derivatives; and tramadol.  MEDICATIONS:  Current Outpatient Medications  Medication Sig Dispense Refill  . Accu-Chek FastClix Lancets MISC USE TO TEST BLOOD SUGAR UP TO FOUR TIMES DAILY AS DIRECTED 204 each 3  . atorvastatin (LIPITOR) 20 MG tablet Take 1 tablet (20 mg total) by mouth daily. 90 tablet  3  . BD INSULIN SYRINGE U/F 31G X 5/16" 0.5 ML MISC USE TO ADMINISTER INSULIN 3 TIMES DAILY WITH MEALS     . Blood Glucose Monitoring Suppl (ACCU-CHEK GUIDE ME) w/Device KIT by Does not apply route.    . diclofenac (VOLTAREN) 75 MG EC tablet TAKE 1 TABLET BY MOUTH TWICE A DAY WITH FOOD AS NEEDED    . glucose blood (ACCU-CHEK GUIDE) test strip Use to check FSBS up to 4 times a day. Dx: E11.65 200 each 2  . Insulin Glargine (LANTUS SOLOSTAR) 100 UNIT/ML Solostar Pen Inject 30 Units into the skin at bedtime. 5 pen 3  . Insulin Pen Needle (NOVOFINE) 30G X 8 MM MISC Use once at night to inject insulin. E.11.9 100 each 3  . insulin regular (HUMULIN R) 100 units/mL injection Inject 0.1 mLs (10 Units total) into the skin 3 (three) times daily before meals. E.11.9. Hold dose for blood sugar less than 125 10 mL 11  . letrozole (FEMARA) 2.5 MG tablet Take 1 tablet (2.5 mg total) by mouth daily. 90 tablet 3  . metoprolol succinate (TOPROL XL) 25 MG 24 hr tablet Take 1 tablet (25 mg total) by mouth daily. 90 tablet 3  . palbociclib (IBRANCE) 125 MG capsule Take 1 capsule (125 mg total) by mouth daily with breakfast. Take whole with food. Take for 21 days on, 7 days off, repeat every 28 days. 21 capsule 3   No current facility-administered medications for this visit.     PHYSICAL EXAMINATION: ECOG PERFORMANCE STATUS: 1 - Symptomatic but completely ambulatory  Vitals:   09/22/18 1151  BP: 121/74  Pulse: (!) 104  Resp: 18  Temp: 98.3 F (36.8 C)  SpO2: 97%   Filed Weights   09/22/18 1151  Weight: 161 lb 6.4 oz (73.2 kg)    GENERAL: alert, no distress and comfortable SKIN: skin color, texture, turgor are normal, no rashes or significant lesions EYES: normal, Conjunctiva are pink and non-injected, sclera clear OROPHARYNX: no exudate, no erythema and lips, buccal mucosa, and tongue normal  NECK: supple, thyroid normal size, non-tender, without nodularity LYMPH: no palpable lymphadenopathy in the cervical, axillary or inguinal LUNGS: clear to auscultation and percussion with normal breathing effort  HEART: regular rate & rhythm and no murmurs and no lower extremity edema ABDOMEN: abdomen soft, non-tender and normal bowel sounds MUSCULOSKELETAL: no cyanosis of digits and no clubbing  NEURO: alert & oriented x 3 with fluent speech, no focal motor/sensory deficits EXTREMITIES: No lower extremity edema  LABORATORY DATA:  I have reviewed the data as listed CMP Latest Ref Rng & Units 08/14/2018 05/10/2018 03/16/2018  Glucose 65 - 99 mg/dL 118(H) 161(H) 140(H)  BUN 6 - 24 mg/dL '21 13 9  ' Creatinine 0.57 - 1.00 mg/dL 0.72 0.68 0.73  Sodium 134 - 144 mmol/L 136 142 135  Potassium 3.5 - 5.2 mmol/L 4.3 4.4 4.3  Chloride 96 - 106 mmol/L 98 104 97  CO2 20 - 29 mmol/L '22 20 21  ' Calcium 8.7 - 10.2 mg/dL 9.9 10.3(H) 9.9  Total Protein 6.0 - 8.5 g/dL 7.0 7.5 6.7  Total Bilirubin 0.0 - 1.2 mg/dL 0.2 0.3 0.7  Alkaline Phos 39 - 117 IU/L 182(H) 155(H) 128(H)  AST 0 - 40 IU/L '26 22 20  ' ALT 0 - 32 IU/L 34(H) 30 27    Lab Results  Component Value Date   WBC 7.7 08/31/2018   HGB 14.8 08/31/2018   HCT 44.0 08/31/2018   MCV 93.2 08/31/2018  PLT 330 08/31/2018   NEUTROABS 3.4 08/31/2018    ASSESSMENT & PLAN:  Malignant neoplasm of lower-inner quadrant of right breast of female, estrogen receptor positive ([place]) Right breast irregular mass lower inner quadrant retroareolar 02/19/2015: 2.2 x 1.3 x 1.7 cm with architectural distortion and skin/nipple retraction, T2 N0 stage II a clinical stage, additional benign cysts largest 1.2 cm Right breast biopsy 02/20/2015 5:00: Invasive ductal carcinoma grade 2, perineural invasion present, ER 90%, PR 70%, HER-2 negative ratio 0.95, Ki-67 15%   Breast MRI 03/07/2015: Subareolar mass 2.2 x 2.4 x 2.5 cm extending into the retracted nipple, extending inferiorly and laterally over a distance of 4 cm, several level I right axillary lymph nodes with cortical thickening up to 7 mm with a maximum diameter of 15 mm  --------------------------------------------------------------------- Treatment summary: Anastrozole with Delton See started April 2020 Bone scan: 09/20/2018: Progression of bone metastatic disease new metastases disease right lesser trochanter, mid left femoral diaphysis, additional bone mets involving left frontal, right iliac, right SI joint, right sixth rib, 12th rib, T12 transverse process and T10  Based on these findings I recommended adding Ibrance to anastrozole. Ibrance: I discussed the risks and benefits of Ibrance including myelosuppression especially neutropenia and with that risk of infection, there is risk of pulmonary embolism and mild peripheral neuropathy as well. Fatigue, nausea, diarrhea, decreased appetite as well as alopecia and thrombocytopenia are also potential side effects of Ibrance  She wants to go on a beach vacation next week and will start on September 29, 2018. We will see her back in 3 weeks for labs and follow-up.    Orders Placed This Encounter  Procedures  . CBC with Differential (Cancer Center Only)    Standing Status:   Standing    Number of Occurrences:   20    Standing Expiration Date:   09/22/2019  . CMP ([place] only)    Standing Status:   Standing    Number of Occurrences:   20    Standing Expiration Date:   09/22/2019   The patient has a good understanding of the overall plan. she agrees with it. she will call with any problems that may develop before the next visit here.  [name], MD 09/22/2018  Julious Oka Dorshimer am acting as scribe for Dr. [name].  I have reviewed the above documentation for accuracy and completeness, and I agree with the above.

## 2018-09-22 ENCOUNTER — Other Ambulatory Visit: Payer: Self-pay

## 2018-09-22 ENCOUNTER — Inpatient Hospital Stay (HOSPITAL_BASED_OUTPATIENT_CLINIC_OR_DEPARTMENT_OTHER): Payer: Medicaid Other | Admitting: Hematology and Oncology

## 2018-09-22 VITALS — BP 121/74 | HR 104 | Temp 98.3°F | Resp 18 | Ht 59.0 in | Wt 161.4 lb

## 2018-09-22 DIAGNOSIS — Z79899 Other long term (current) drug therapy: Secondary | ICD-10-CM | POA: Diagnosis not present

## 2018-09-22 DIAGNOSIS — C7951 Secondary malignant neoplasm of bone: Secondary | ICD-10-CM

## 2018-09-22 DIAGNOSIS — Z79811 Long term (current) use of aromatase inhibitors: Secondary | ICD-10-CM | POA: Diagnosis not present

## 2018-09-22 DIAGNOSIS — C50311 Malignant neoplasm of lower-inner quadrant of right female breast: Secondary | ICD-10-CM | POA: Diagnosis not present

## 2018-09-22 DIAGNOSIS — R110 Nausea: Secondary | ICD-10-CM | POA: Diagnosis not present

## 2018-09-22 DIAGNOSIS — Z170 Estrogen receptor positive status [ER+]: Secondary | ICD-10-CM

## 2018-09-22 MED ORDER — PALBOCICLIB 125 MG PO CAPS: 125.0000 mg | ORAL_CAPSULE | Freq: Every day | ORAL | 3 refills | Status: DC

## 2018-09-22 NOTE — Assessment & Plan Note (Signed)
Right breast irregular mass lower inner quadrant retroareolar 02/19/2015: 2.2 x 1.3 x 1.7 cm with architectural distortion and skin/nipple retraction, T2 N0 stage II a clinical stage, additional benign cysts largest 1.2 cm Right breast biopsy 02/20/2015 5:00: Invasive ductal carcinoma grade 2, perineural invasion present, ER 90%, PR 70%, HER-2 negative ratio 0.95, Ki-67 15%   Breast MRI 03/07/2015: Subareolar mass 2.2 x 2.4 x 2.5 cm extending into the retracted nipple, extending inferiorly and laterally over a distance of 4 cm, several level I right axillary lymph nodes with cortical thickening up to 7 mm with a maximum diameter of 15 mm --------------------------------------------------------------------- Treatment summary: Anastrozole with Delton See started April 2020 Bone scan: 09/20/2018: Progression of bone metastatic disease new metastases disease right lesser trochanter, mid left femoral diaphysis, additional bone mets involving left frontal, right iliac, right SI joint, right sixth rib, 12th rib, T12 transverse process and T10  Based on these findings I recommended adding Ibrance to anastrozole. Ibrance: I discussed the risks and benefits of Ibrance including myelosuppression especially neutropenia and with that risk of infection, there is risk of pulmonary embolism and mild peripheral neuropathy as well. Fatigue, nausea, diarrhea, decreased appetite as well as alopecia and thrombocytopenia are also potential side effects of Ibrance  She wants to go on a beach vacation next week and will start on September 29, 2018. We will see her back in 3 weeks for labs and follow-up.

## 2018-09-26 ENCOUNTER — Telehealth: Payer: Self-pay | Admitting: Pharmacist

## 2018-09-26 ENCOUNTER — Telehealth: Payer: Self-pay | Admitting: Hematology and Oncology

## 2018-09-26 DIAGNOSIS — E785 Hyperlipidemia, unspecified: Secondary | ICD-10-CM

## 2018-09-26 DIAGNOSIS — IMO0001 Reserved for inherently not codable concepts without codable children: Secondary | ICD-10-CM

## 2018-09-26 DIAGNOSIS — C50311 Malignant neoplasm of lower-inner quadrant of right female breast: Secondary | ICD-10-CM

## 2018-09-26 NOTE — Telephone Encounter (Signed)
Oral Oncology Pharmacist Encounter  Received new prescription for Ibrance (palbociclib) for the treatment of metastatic ER/PR positive, HER-2 negative breast cancer in conjunction with letrozole, planned duration until disease progression or unacceptable toxicity.  Patient originally diagnosed with breast cancer in 01/2015. Patient received neoadjuvant antiestrogen therapy with tamoxifen in 2017, but stopped late 2017. Patient initiated treatment with anastrozole in 04/2018 and then switched to letrozole 2.5 mg daily in 08/2018. Patient started on Xgeva 09/07/18 due to evidence of progression of bone metastases.     Plan is that patient will take Ibrance 125 mg capsule by mouth once daily with breakfast for 21 days on, 7 days off, repeated every 28 days.  Patient will need CBC monitored 2 weeks after initiation of Ibrance, then prior to each cycle. Patient has labs scheduled for 10/13/18.   Labs from Epic assessed, CBC from 08/31/18 and CMET from 08/14/18 OK for treatment initiation with Ibrance.  Current medication list in Epic reviewed, DDIs with Ibrance identified:  Category C interaction: Ibrance may increase the serum concentration of CYP3A4 substrates (atorvastatin), possibly leading to increased side effects from atorvastatin. Patient will be counseled.   Prescription has been e-scribed to the [facility] for benefits analysis and approval.  Oral Oncology Clinic will continue to follow for insurance authorization, copayment issues, initial counseling and start date.  Leron Croak, PharmD PGY2 Oncology/Hematology Pharmacy Resident 09/26/2018 3:22 PM Oral Oncology Clinic [phone]

## 2018-09-26 NOTE — Telephone Encounter (Signed)
I left a message with schedule

## 2018-09-27 NOTE — Telephone Encounter (Addendum)
Oral Chemotherapy Pharmacist Encounter   Attempted to reach patient to provide update and offer for initial counseling on oral medication: Ibrance (palbociclib).  No answer.  Left voicemail for patient to call back to discuss details of medication acquisition and initial counseling session.   Leron Croak, PharmD PGY2 Oncology/Hematology Pharmacy Resident 09/27/2018 4:51 PM Oral Oncology Clinic [phone]

## 2018-10-02 MED ORDER — PALBOCICLIB 125 MG PO TABS: 125.0000 mg | ORAL_TABLET | Freq: Every day | ORAL | 3 refills | Status: DC

## 2018-10-02 MED ORDER — PROCHLORPERAZINE MALEATE 10 MG PO TABS: 10.0000 mg | ORAL_TABLET | Freq: Three times a day (TID) | ORAL | 1 refills | Status: DC | PRN

## 2018-10-02 MED FILL — IBRANCE 125 MG TABS: 125 | 28 days supply | Qty: 21 | Fill #0

## 2018-10-02 MED FILL — PROCHLORPERAZINE 10 MG TAB: 10 | 10 days supply | Qty: 30 | Fill #0

## 2018-10-02 NOTE — Telephone Encounter (Signed)
Oral Chemotherapy Pharmacist Encounter  I spoke with patient for overview of: Ibrance (palbociclib) for the treatment of metastatic, hormone-receptor positive, HER2 receptor negative breast cancer, in combination with letrozole, planned duration until disease progression or unacceptable toxicity.   Counseled patient on administration, dosing, side effects, monitoring, drug-food interactions, safe handling, storage, and disposal.  Patient will take Ibrance 120m tablet, 1 tablet by mouth once daily, with or without food, taken for 3 weeks on, 1 week off, and repeated.  Patient knows to avoid grapefruit and grapefruit juice while on treatment with Ibrance.  Patient is taking letrozole 2.5 mg once daily.   Ibrance start date: 10/03/18  Adverse effects include but are not limited to: fatigue, hair loss, GI upset, nausea, decreased blood counts, and increased upper respiratory infections. Severe, life-threatening, and/or fatal interstitial lung disease (ILD) and/or pneumonitis may occur with CDK 4/6 inhibitors.  Patient would like to have anti-emetic on hand at home for nausea management. MD contacted regarding patient request.   Patient will obtain anti diarrheal and alert the office of 4 or more loose stools above baseline.  Patient reminded of WBC check on Cycle 1 Day 14 for dose and ANC assessment.  Reviewed with patient importance of keeping a medication schedule and plan for any missed doses.  Medication reconciliation performed and medication/allergy list updated. Patient stated that she is no longer taking atorvastatin due to issues with shortness of breath that resolved per patient with discontinuation, advised patient that she needs to alert PCP regarding this. Patient also stated that she takes ascorbic acid and milk thistle. No drug interactions identified with these supplements. Medication list has been updated to reflect this.   Insurance authorization for [facility]  obtained. Test claim at the pharmacy revealed copayment $3 for 1st fill of Ibrance. [facility] [place] [facility] [place] to patient's home on 10/03/18.  Patient informed the pharmacy will reach out 5-7 days prior to needing next fill of Ibrance to coordinate continued medication acquisition to prevent break in therapy.  All questions answered.  Ms. GKeltyvoiced understanding and appreciation.   Patient knows to call the office with questions or concerns.  RLeron Croak PharmD PGY2 Oncology/Hematology Pharmacy Resident 10/02/2018 11:26 AM Oral Oncology Clinic [phone]

## 2018-10-03 NOTE — Telephone Encounter (Signed)
Oral Oncology Patient Advocate Encounter  Confirmed with [facility] that [facility] was shipped on 8/10 with a $3 copay.   [place] Patient [place] Phone [phone] Fax [phone] 10/03/2018   9:00 AM

## 2018-10-06 ENCOUNTER — Other Ambulatory Visit: Payer: Self-pay | Admitting: Family Medicine

## 2018-10-06 NOTE — Assessment & Plan Note (Signed)
Right breast irregular mass lower inner quadrant retroareolar 02/19/2015: 2.2 x 1.3 x 1.7 cm with architectural distortion and skin/nipple retraction, T2 N0 stage II a clinical stage, additional benign cysts largest 1.2 cm Right breast biopsy 02/20/2015 5:00: Invasive ductal carcinoma grade 2, perineural invasion present, ER 90%, PR 70%, HER-2 negative ratio 0.95, Ki-67 15%   Breast MRI 03/07/2015: Subareolar mass 2.2 x 2.4 x 2.5 cm extending into the retracted nipple, extending inferiorly and laterally over a distance of 4 cm, several level I right axillary lymph nodes with cortical thickening up to 7 mm with a maximum diameter of 15 mm --------------------------------------------------------------------- Treatment summary: Anastrozole with Delton See started April 2020 Ibrance added 09/29/2018 Bone scan: 09/20/2018: Progression of bone metastatic disease new metastases disease right lesser trochanter, mid left femoral diaphysis, additional bone mets involving left frontal, right iliac, right SI joint, right sixth rib, 12th rib, T12 transverse process and T10  Ibrance toxicities:  Lab review:  Return to clinic in 2 weeks for cycle 2 Ibrance

## 2018-10-12 NOTE — Progress Notes (Signed)
Patient Care Team: Scot Jun, FNP as PCP - General (Family Medicine)  DIAGNOSIS:    ICD-10-CM   1. Bone metastasis ([name])  C79.51   2. Malignant neoplasm of lower-inner quadrant of right breast of female, estrogen receptor positive ([name])  C50.311    Z17.0     SUMMARY OF ONCOLOGIC HISTORY: Oncology History  Malignant neoplasm of lower-inner quadrant of right breast of female, estrogen receptor positive ([name])  02/19/2015 Mammogram   Right breast irregular mass lower inner quadrant 2.2 x 1.3 x 1.7 cm with architectural distortion and skin/nipple retraction, T2 N0 stage II a clinical stage, additional benign cysts largest 1.2 cm   02/20/2015 Initial Diagnosis   Right breast biopsy 5:00: Invasive ductal carcinoma grade 2, perineural invasion present, ER 90%, PR 70%, HER-2 negative ratio 0.95, Ki-67 15%    03/07/2015 Breast MRI   right breast inferior subareolar mass measuring 2.2 x 2.4 x 2.5 cm with skin and nipple enhancement, extending inferiorly and laterally is intraductal enhancement over a distance of 4 cm, several level I right axillary lymph nodes with cortical thickening   03/13/2015 -  Anti-estrogen oral therapy   Neoadjuvant antiestrogen therapy with tamoxifen 20 mg daily (because the patient did not want to undergo surgery immediately for multiple family reasons) stopped in late 2017; anastrozole started 05/11/18   05/23/2018 Imaging   CT CAP: 2.9 cm spiculated soft tissue density central right breast, no evidence of soft tissue metastatic disease within the chest abdomen pelvis.  Subcentimeter sclerotic bone lesions T10, left pubis, right posterior ilium Bone scan: Foci of uptake left frontal calvarium, T-spine, right ilium   10/03/2018 -  Anti-estrogen oral therapy   Ibrance with anastrozole     CHIEF COMPLIANT: Follow-up of metastaic breast cancer on anastrozole and Ibrance  INTERVAL HISTORY: Emily Wilson is a 53 y.o. with above-mentioned history of  metastatic breast cancer currently on anastrozole and Ibrance, started 10/03/18. She presents to the clinic today for a toxicity check.    REVIEW OF SYSTEMS:   Constitutional: Denies fevers, chills or abnormal weight loss Eyes: Denies blurriness of vision Ears, nose, mouth, throat, and face: Denies mucositis or sore throat Respiratory: Denies cough, dyspnea or wheezes Cardiovascular: Denies palpitation, chest discomfort Gastrointestinal: Denies nausea, heartburn or change in bowel habits Skin: Denies abnormal skin rashes Lymphatics: Denies new lymphadenopathy or easy bruising Neurological: Denies numbness, tingling or new weaknesses Behavioral/Psych: Mood is stable, no new changes  Extremities: No lower extremity edema Breast: denies any pain or lumps or nodules in either breasts All other systems were reviewed with the patient and are negative.  I have reviewed the past medical history, past surgical history, social history and family history with the patient and they are unchanged from previous note.  ALLERGIES:  is allergic to codeine; hydrocodone-acetaminophen; meloxicam; naproxen; propoxyphene n-acetaminophen; sulfonamide derivatives; and tramadol.  MEDICATIONS:  Current Outpatient Medications  Medication Sig Dispense Refill  . Accu-Chek FastClix Lancets MISC USE TO TEST BLOOD SUGAR UP TO FOUR TIMES DAILY AS DIRECTED 204 each 3  . atorvastatin (LIPITOR) 20 MG tablet Take 1 tablet (20 mg total) by mouth daily. (Patient not taking: Reported on 10/02/2018) 90 tablet 3  . BD INSULIN SYRINGE U/F 31G X 5/16" 0.5 ML MISC USE TO ADMINISTER INSULIN 3 TIMES DAILY WITH MEALS    . Blood Glucose Monitoring Suppl (ACCU-CHEK GUIDE ME) w/Device KIT by Does not apply route.    . diclofenac (VOLTAREN) 75 MG EC tablet TAKE 1 TABLET  BY MOUTH TWICE A DAY WITH FOOD AS NEEDED    . glucose blood (ACCU-CHEK GUIDE) test strip Use to check FSBS up to 4 times a day. Dx: E11.65 200 each 2  . Insulin Glargine  (LANTUS SOLOSTAR) 100 UNIT/ML Solostar Pen Inject 30 Units into the skin at bedtime. 5 pen 3  . Insulin Pen Needle (NOVOFINE) 30G X 8 MM MISC Use once at night to inject insulin. E.11.9 100 each 3  . insulin regular (HUMULIN R) 100 units/mL injection Inject 0.1 mLs (10 Units total) into the skin 3 (three) times daily before meals. E.11.9. Hold dose for blood sugar less than 125 10 mL 11  . letrozole (FEMARA) 2.5 MG tablet Take 1 tablet (2.5 mg total) by mouth daily. 90 tablet 3  . metoprolol succinate (TOPROL XL) 25 MG 24 hr tablet Take 1 tablet (25 mg total) by mouth daily. 90 tablet 3  . MILK THISTLE PO Take by mouth daily.    . palbociclib (IBRANCE) 125 MG tablet Take 1 tablet (125 mg total) by mouth daily. Take for 21 days on, 7 days off, repeat every 28 days. 21 tablet 3  . prochlorperazine (COMPAZINE) 10 MG tablet Take 1 tablet (10 mg total) by mouth every 8 (eight) hours as needed for nausea or vomiting. 30 tablet 1  . vitamin C (ASCORBIC ACID) 500 MG tablet Take 500 mg by mouth daily.     No current facility-administered medications for this visit.     PHYSICAL EXAMINATION: ECOG PERFORMANCE STATUS: 1 - Symptomatic but completely ambulatory  There were no vitals filed for this visit. There were no vitals filed for this visit.  GENERAL: alert, no distress and comfortable SKIN: skin color, texture, turgor are normal, no rashes or significant lesions EYES: normal, Conjunctiva are pink and non-injected, sclera clear OROPHARYNX: no exudate, no erythema and lips, buccal mucosa, and tongue normal  NECK: supple, thyroid normal size, non-tender, without nodularity LYMPH: no palpable lymphadenopathy in the cervical, axillary or inguinal LUNGS: clear to auscultation and percussion with normal breathing effort HEART: regular rate & rhythm and no murmurs and no lower extremity edema ABDOMEN: abdomen soft, non-tender and normal bowel sounds MUSCULOSKELETAL: no cyanosis of digits and no clubbing   NEURO: alert & oriented x 3 with fluent speech, no focal motor/sensory deficits EXTREMITIES: No lower extremity edema  LABORATORY DATA:  I have reviewed the data as listed CMP Latest Ref Rng & Units 08/14/2018 05/10/2018 03/16/2018  Glucose 65 - 99 mg/dL 118(H) 161(H) 140(H)  BUN 6 - 24 mg/dL '21 13 9  ' Creatinine 0.57 - 1.00 mg/dL 0.72 0.68 0.73  Sodium 134 - 144 mmol/L 136 142 135  Potassium 3.5 - 5.2 mmol/L 4.3 4.4 4.3  Chloride 96 - 106 mmol/L 98 104 97  CO2 20 - 29 mmol/L '22 20 21  ' Calcium 8.7 - 10.2 mg/dL 9.9 10.3(H) 9.9  Total Protein 6.0 - 8.5 g/dL 7.0 7.5 6.7  Total Bilirubin 0.0 - 1.2 mg/dL 0.2 0.3 0.7  Alkaline Phos 39 - 117 IU/L 182(H) 155(H) 128(H)  AST 0 - 40 IU/L '26 22 20  ' ALT 0 - 32 IU/L 34(H) 30 27    Lab Results  Component Value Date   WBC 6.4 10/13/2018   HGB 14.2 10/13/2018   HCT 42.1 10/13/2018   MCV 95.0 10/13/2018   PLT 282 10/13/2018   NEUTROABS PENDING 10/13/2018    ASSESSMENT & PLAN:  Malignant neoplasm of lower-inner quadrant of right breast of female, estrogen receptor  positive ([place]) Right breast irregular mass lower inner quadrant retroareolar 02/19/2015: 2.2 x 1.3 x 1.7 cm with architectural distortion and skin/nipple retraction, T2 N0 stage II a clinical stage, additional benign cysts largest 1.2 cm Right breast biopsy 02/20/2015 5:00: Invasive ductal carcinoma grade 2, perineural invasion present, ER 90%, PR 70%, HER-2 negative ratio 0.95, Ki-67 15%   Breast MRI 03/07/2015: Subareolar mass 2.2 x 2.4 x 2.5 cm extending into the retracted nipple, extending inferiorly and laterally over a distance of 4 cm, several level I right axillary lymph nodes with cortical thickening up to 7 mm with a maximum diameter of 15 mm --------------------------------------------------------------------- Treatment summary: Anastrozole with Delton See started April 2020 Ibrance added 09/29/2018 Bone scan: 09/20/2018: Progression of bone metastatic disease new metastases disease  right lesser trochanter, mid left femoral diaphysis, additional bone mets involving left frontal, right iliac, right SI joint, right sixth rib, 12th rib, T12 transverse process and T10  Ibrance toxicities: No adverse effects  Lab review:WBC; 6.4, Hb 14.2  Return to clinic in 2 weeks for labs and follow-up.    No orders of the defined types were placed in this encounter.  The patient has a good understanding of the overall plan. she agrees with it. she will call with any problems that may develop before the next visit here.  [name], MD 10/13/2018  Julious Oka Dorshimer am acting as scribe for Dr. [name].  I have reviewed the above documentation for accuracy and completeness, and I agree with the above.

## 2018-10-13 ENCOUNTER — Inpatient Hospital Stay: Payer: Medicaid Other | Attending: Hematology and Oncology | Admitting: Hematology and Oncology

## 2018-10-13 ENCOUNTER — Other Ambulatory Visit: Payer: Self-pay

## 2018-10-13 ENCOUNTER — Inpatient Hospital Stay: Payer: Medicaid Other

## 2018-10-13 VITALS — BP 109/74 | HR 81 | Temp 98.7°F | Resp 18 | Ht 59.0 in | Wt 164.0 lb

## 2018-10-13 DIAGNOSIS — C7951 Secondary malignant neoplasm of bone: Secondary | ICD-10-CM | POA: Diagnosis not present

## 2018-10-13 DIAGNOSIS — Z886 Allergy status to analgesic agent status: Secondary | ICD-10-CM | POA: Diagnosis not present

## 2018-10-13 DIAGNOSIS — E1165 Type 2 diabetes mellitus with hyperglycemia: Secondary | ICD-10-CM | POA: Insufficient documentation

## 2018-10-13 DIAGNOSIS — Z79899 Other long term (current) drug therapy: Secondary | ICD-10-CM | POA: Insufficient documentation

## 2018-10-13 DIAGNOSIS — Z882 Allergy status to sulfonamides status: Secondary | ICD-10-CM | POA: Insufficient documentation

## 2018-10-13 DIAGNOSIS — C50311 Malignant neoplasm of lower-inner quadrant of right female breast: Secondary | ICD-10-CM

## 2018-10-13 DIAGNOSIS — E785 Hyperlipidemia, unspecified: Secondary | ICD-10-CM | POA: Diagnosis not present

## 2018-10-13 DIAGNOSIS — Z888 Allergy status to other drugs, medicaments and biological substances status: Secondary | ICD-10-CM | POA: Diagnosis not present

## 2018-10-13 DIAGNOSIS — Z79811 Long term (current) use of aromatase inhibitors: Secondary | ICD-10-CM | POA: Diagnosis not present

## 2018-10-13 DIAGNOSIS — Z170 Estrogen receptor positive status [ER+]: Secondary | ICD-10-CM | POA: Diagnosis not present

## 2018-10-13 DIAGNOSIS — Z794 Long term (current) use of insulin: Secondary | ICD-10-CM | POA: Insufficient documentation

## 2018-10-13 DIAGNOSIS — Z885 Allergy status to narcotic agent status: Secondary | ICD-10-CM | POA: Diagnosis not present

## 2018-10-13 LAB — CMP (CANCER CENTER ONLY)
ALT: 36 U/L (ref 0–44)
AST: 21 U/L (ref 15–41)
Albumin: 3.8 g/dL (ref 3.5–5.0)
Alkaline Phosphatase: 136 U/L — ABNORMAL HIGH (ref 38–126)
Anion gap: 11 (ref 5–15)
BUN: 17 mg/dL (ref 6–20)
CO2: 24 mmol/L (ref 22–32)
Calcium: 9.2 mg/dL (ref 8.9–10.3)
Chloride: 104 mmol/L (ref 98–111)
Creatinine: 0.93 mg/dL (ref 0.44–1.00)
GFR, Est AFR Am: >60 mL/min (ref >60)
GFR, Estimated: >60 mL/min (ref >60)
Glucose, Bld: 246 mg/dL — ABNORMAL HIGH (ref 70–99)
Potassium: 4.2 mmol/L (ref 3.5–5.1)
Sodium: 139 mmol/L (ref 135–145)
Total Bilirubin: 0.6 mg/dL (ref 0.3–1.2)
Total Protein: 7 g/dL (ref 6.5–8.1)

## 2018-10-13 LAB — CBC WITH DIFFERENTIAL (CANCER CENTER ONLY)
Abs Immature Granulocytes: 0.01 10*3/uL (ref 0.00–0.07)
Basophils Absolute: 0.1 10*3/uL (ref 0.0–0.1)
Basophils Relative: 1 %
Eosinophils Absolute: 0.2 10*3/uL (ref 0.0–0.5)
Eosinophils Relative: 3 %
HCT: 42.1 % (ref 36.0–46.0)
Hemoglobin: 14.2 g/dL (ref 12.0–15.0)
Immature Granulocytes: 0 %
Lymphocytes Relative: 44 %
Lymphs Abs: 2.8 10*3/uL (ref 0.7–4.0)
MCH: 32.1 pg (ref 26.0–34.0)
MCHC: 33.7 g/dL (ref 30.0–36.0)
MCV: 95 fL (ref 80.0–100.0)
Monocytes Absolute: 0.3 10*3/uL (ref 0.1–1.0)
Monocytes Relative: 5 %
Neutro Abs: 2.9 10*3/uL (ref 1.7–7.7)
Neutrophils Relative %: 47 %
Platelet Count: 282 10*3/uL (ref 150–400)
RBC: 4.43 MIL/uL (ref 3.87–5.11)
RDW: 12 % (ref 11.5–15.5)
WBC Count: 6.4 10*3/uL (ref 4.0–10.5)
nRBC: 0 % (ref 0.0–0.2)

## 2018-10-16 ENCOUNTER — Telehealth: Payer: Self-pay | Admitting: Hematology and Oncology

## 2018-10-16 NOTE — Telephone Encounter (Signed)
I talk with patient regarding schedule 9/4

## 2018-10-17 ENCOUNTER — Telehealth: Payer: Self-pay | Admitting: Hematology and Oncology

## 2018-10-17 NOTE — Telephone Encounter (Signed)
Patient called back I was able to reschedule for MD pal 9/4

## 2018-10-26 ENCOUNTER — Telehealth: Payer: Self-pay | Admitting: Family Medicine

## 2018-10-26 MED FILL — IBRANCE 125 MG TABS: 125 | 28 days supply | Qty: 21 | Fill #1

## 2018-10-27 ENCOUNTER — Ambulatory Visit: Payer: Medicaid Other | Admitting: Hematology and Oncology

## 2018-10-27 ENCOUNTER — Other Ambulatory Visit: Payer: Medicaid Other

## 2018-10-29 NOTE — Progress Notes (Signed)
Patient Care Team: Scot Jun, FNP as PCP - General (Family Medicine)  DIAGNOSIS:    ICD-10-CM   1. Bone metastasis ([place])  C79.51   2. Malignant neoplasm of lower-inner quadrant of right breast of female, estrogen receptor positive ([place])  C50.311    Z17.0     SUMMARY OF ONCOLOGIC HISTORY: Oncology History  Malignant neoplasm of lower-inner quadrant of right breast of female, estrogen receptor positive ([place])  02/19/2015 Mammogram   Right breast irregular mass lower inner quadrant 2.2 x 1.3 x 1.7 cm with architectural distortion and skin/nipple retraction, T2 N0 stage II a clinical stage, additional benign cysts largest 1.2 cm   02/20/2015 Initial Diagnosis   Right breast biopsy 5:00: Invasive ductal carcinoma grade 2, perineural invasion present, ER 90%, PR 70%, HER-2 negative ratio 0.95, Ki-67 15%    03/07/2015 Breast MRI   right breast inferior subareolar mass measuring 2.2 x 2.4 x 2.5 cm with skin and nipple enhancement, extending inferiorly and laterally is intraductal enhancement over a distance of 4 cm, several level I right axillary lymph nodes with cortical thickening   03/13/2015 -  Anti-estrogen oral therapy   Neoadjuvant antiestrogen therapy with tamoxifen 20 mg daily (because the patient did not want to undergo surgery immediately for multiple family reasons) stopped in late 2017; anastrozole started 05/11/18   05/23/2018 Imaging   CT CAP: 2.9 cm spiculated soft tissue density central right breast, no evidence of soft tissue metastatic disease within the chest abdomen pelvis.  Subcentimeter sclerotic bone lesions T10, left pubis, right posterior ilium Bone scan: Foci of uptake left frontal calvarium, T-spine, right ilium   10/04/2018 -  Anti-estrogen oral therapy   Ibrance with anastrozole     CHIEF COMPLIANT: Follow-up of metastaic breast cancer on anastrozole and Ibrance  INTERVAL HISTORY: Emily Wilson is a 53 y.o. with above-mentioned history of  metastatic breast cancer currently on anastrozole and Ibrance. She presents to the clinic today for a toxicity check.  She is scheduled to undergo mastectomy on 11/16/2018. She also has a bad tooth which may need to be removed soon. She tolerated Ibrance extremely well without any major issues or concerns.  Today her [place] is 1.2.  REVIEW OF SYSTEMS:   Constitutional: Denies fevers, chills or abnormal weight loss Eyes: Denies blurriness of vision Ears, nose, mouth, throat, and face: Denies mucositis or sore throat Respiratory: Denies cough, dyspnea or wheezes Cardiovascular: Denies palpitation, chest discomfort Gastrointestinal: Denies nausea, heartburn or change in bowel habits Skin: Denies abnormal skin rashes Lymphatics: Denies new lymphadenopathy or easy bruising Neurological: Denies numbness, tingling or new weaknesses Behavioral/Psych: Mood is stable, no new changes  Extremities: No lower extremity edema Breast: denies any pain or lumps or nodules in either breasts All other systems were reviewed with the patient and are negative.  I have reviewed the past medical history, past surgical history, social history and family history with the patient and they are unchanged from previous note.  ALLERGIES:  is allergic to codeine; hydrocodone-acetaminophen; meloxicam; naproxen; propoxyphene n-acetaminophen; sulfonamide derivatives; and tramadol.  MEDICATIONS:  Current Outpatient Medications  Medication Sig Dispense Refill  . Accu-Chek FastClix Lancets MISC USE TO TEST BLOOD SUGAR UP TO FOUR TIMES DAILY AS DIRECTED 204 each 3  . ACCU-CHEK GUIDE test strip USE TO CHECK FSBS UP TO 4 TIMES A DAY. DX: E11.65 100 strip 5  . atorvastatin (LIPITOR) 20 MG tablet Take 1 tablet (20 mg total) by mouth daily. (Patient not taking: Reported on  10/02/2018) 90 tablet 3  . BD INSULIN SYRINGE U/F 31G X 5/16" 0.5 ML MISC USE TO ADMINISTER INSULIN 3 TIMES DAILY WITH MEALS    . Blood Glucose Monitoring Suppl  (ACCU-CHEK GUIDE ME) w/Device KIT by Does not apply route.    . diclofenac (VOLTAREN) 75 MG EC tablet TAKE 1 TABLET BY MOUTH TWICE A DAY WITH FOOD AS NEEDED    . Insulin Glargine (LANTUS SOLOSTAR) 100 UNIT/ML Solostar Pen Inject 30 Units into the skin at bedtime. 5 pen 3  . Insulin Pen Needle (NOVOFINE) 30G X 8 MM MISC Use once at night to inject insulin. E.11.9 100 each 3  . insulin regular (HUMULIN R) 100 units/mL injection Inject 0.1 mLs (10 Units total) into the skin 3 (three) times daily before meals. E.11.9. Hold dose for blood sugar less than 125 10 mL 11  . letrozole (FEMARA) 2.5 MG tablet Take 1 tablet (2.5 mg total) by mouth daily. 90 tablet 3  . metoprolol succinate (TOPROL XL) 25 MG 24 hr tablet Take 1 tablet (25 mg total) by mouth daily. 90 tablet 3  . MILK THISTLE PO Take by mouth daily.    . palbociclib (IBRANCE) 125 MG tablet Take 1 tablet (125 mg total) by mouth daily. Take for 21 days on, 7 days off, repeat every 28 days. 21 tablet 3  . prochlorperazine (COMPAZINE) 10 MG tablet Take 1 tablet (10 mg total) by mouth every 8 (eight) hours as needed for nausea or vomiting. 30 tablet 1  . vitamin C (ASCORBIC ACID) 500 MG tablet Take 500 mg by mouth daily.     No current facility-administered medications for this visit.     PHYSICAL EXAMINATION: ECOG PERFORMANCE STATUS: 1 - Symptomatic but completely ambulatory  Vitals:   10/31/18 1532  BP: (!) 113/59  Pulse: (!) 104  Resp: 18  Temp: 97.8 F (36.6 C)  SpO2: 100%   Filed Weights   10/31/18 1532  Weight: 163 lb 6.4 oz (74.1 kg)    GENERAL: alert, no distress and comfortable SKIN: skin color, texture, turgor are normal, no rashes or significant lesions EYES: normal, Conjunctiva are pink and non-injected, sclera clear OROPHARYNX: no exudate, no erythema and lips, buccal mucosa, and tongue normal  NECK: supple, thyroid normal size, non-tender, without nodularity LYMPH: no palpable lymphadenopathy in the cervical, axillary  or inguinal LUNGS: clear to auscultation and percussion with normal breathing effort HEART: regular rate & rhythm and no murmurs and no lower extremity edema ABDOMEN: abdomen soft, non-tender and normal bowel sounds MUSCULOSKELETAL: no cyanosis of digits and no clubbing  NEURO: alert & oriented x 3 with fluent speech, no focal motor/sensory deficits EXTREMITIES: No lower extremity edema  LABORATORY DATA:  I have reviewed the data as listed CMP Latest Ref Rng & Units 10/31/2018 10/13/2018 08/14/2018  Glucose 70 - 99 mg/dL 266(H) 246(H) 118(H)  BUN 6 - 20 mg/dL '19 17 21  ' Creatinine 0.44 - 1.00 mg/dL 0.92 0.93 0.72  Sodium 135 - 145 mmol/L 139 139 136  Potassium 3.5 - 5.1 mmol/L 4.1 4.2 4.3  Chloride 98 - 111 mmol/L 106 104 98  CO2 22 - 32 mmol/L '22 24 22  ' Calcium 8.9 - 10.3 mg/dL 9.1 9.2 9.9  Total Protein 6.5 - 8.1 g/dL 7.2 7.0 7.0  Total Bilirubin 0.3 - 1.2 mg/dL 0.3 0.6 0.2  Alkaline Phos 38 - 126 U/L 108 136(H) 182(H)  AST 15 - 41 U/L '26 21 26  ' ALT 0 - 44 U/L 41  36 34(H)    Lab Results  Component Value Date   WBC 4.6 10/31/2018   HGB 13.7 10/31/2018   HCT 39.9 10/31/2018   MCV 94.8 10/31/2018   PLT 350 10/31/2018   NEUTROABS 1.2 (L) 10/31/2018    ASSESSMENT & PLAN:  Malignant neoplasm of lower-inner quadrant of right breast of female, estrogen receptor positive ([place]) Right breast irregular mass lower inner quadrant retroareolar 02/19/2015: 2.2 x 1.3 x 1.7 cm with architectural distortion and skin/nipple retraction, T2 N0 stage II a clinical stage, additional benign cysts largest 1.2 cm Right breast biopsy 02/20/2015 5:00: Invasive ductal carcinoma grade 2, perineural invasion present, ER 90%, PR 70%, HER-2 negative ratio 0.95, Ki-67 15%   Breast MRI 03/07/2015: Subareolar mass 2.2 x 2.4 x 2.5 cm extending into the retracted nipple, extending inferiorly and laterally over a distance of 4 cm, several level I right axillary lymph nodes with cortical thickening up to 7 mm with a  maximum diameter of 15 mm --------------------------------------------------------------------- Treatment summary: Anastrozole with Delton See started April 2020 Ibrance added 09/29/2018 Bone scan: 09/20/2018: Progression of bone metastatic disease new metastases disease right lesser trochanter, mid left femoral diaphysis, additional bone mets involving left frontal, right iliac, right SI joint, right sixth rib, 12th rib, T12 transverse process and T10  Ibrance toxicities: No adverse effects  Lab review:WBC; 4.6, Hb 13.7, ANC 1.2, platelets 350 Patient is planning to undergo mastectomy because of fungating tumor. Because of this we will hold Ibrance for about a month. We will see her back on 12/01/2018 and resume Ibrance if she is healed from surgery.   No orders of the defined types were placed in this encounter.  The patient has a good understanding of the overall plan. she agrees with it. she will call with any problems that may develop before the next visit here.  [name], MD 10/31/2018  Julious Oka Dorshimer am acting as scribe for Dr. [name].  I have reviewed the above documentation for accuracy and completeness, and I agree with the above.

## 2018-10-31 ENCOUNTER — Other Ambulatory Visit: Payer: Self-pay

## 2018-10-31 ENCOUNTER — Inpatient Hospital Stay: Payer: Medicaid Other | Attending: Hematology and Oncology

## 2018-10-31 ENCOUNTER — Inpatient Hospital Stay (HOSPITAL_BASED_OUTPATIENT_CLINIC_OR_DEPARTMENT_OTHER): Payer: Medicaid Other | Admitting: Hematology and Oncology

## 2018-10-31 VITALS — BP 113/59 | HR 104 | Temp 97.8°F | Resp 18 | Ht 59.0 in | Wt 163.4 lb

## 2018-10-31 DIAGNOSIS — Z170 Estrogen receptor positive status [ER+]: Secondary | ICD-10-CM

## 2018-10-31 DIAGNOSIS — Z886 Allergy status to analgesic agent status: Secondary | ICD-10-CM | POA: Insufficient documentation

## 2018-10-31 DIAGNOSIS — C7951 Secondary malignant neoplasm of bone: Secondary | ICD-10-CM | POA: Insufficient documentation

## 2018-10-31 DIAGNOSIS — Z79899 Other long term (current) drug therapy: Secondary | ICD-10-CM | POA: Diagnosis not present

## 2018-10-31 DIAGNOSIS — C50311 Malignant neoplasm of lower-inner quadrant of right female breast: Secondary | ICD-10-CM | POA: Diagnosis not present

## 2018-10-31 DIAGNOSIS — Z888 Allergy status to other drugs, medicaments and biological substances status: Secondary | ICD-10-CM | POA: Insufficient documentation

## 2018-10-31 DIAGNOSIS — Z882 Allergy status to sulfonamides status: Secondary | ICD-10-CM | POA: Insufficient documentation

## 2018-10-31 DIAGNOSIS — Z79811 Long term (current) use of aromatase inhibitors: Secondary | ICD-10-CM | POA: Diagnosis not present

## 2018-10-31 DIAGNOSIS — Z885 Allergy status to narcotic agent status: Secondary | ICD-10-CM | POA: Diagnosis not present

## 2018-10-31 DIAGNOSIS — Z9010 Acquired absence of unspecified breast and nipple: Secondary | ICD-10-CM | POA: Diagnosis not present

## 2018-10-31 LAB — CBC WITH DIFFERENTIAL (CANCER CENTER ONLY)
Abs Immature Granulocytes: 0.03 10*3/uL (ref 0.00–0.07)
Basophils Absolute: 0.1 10*3/uL (ref 0.0–0.1)
Basophils Relative: 2 %
Eosinophils Absolute: 0.1 10*3/uL (ref 0.0–0.5)
Eosinophils Relative: 2 %
HCT: 39.9 % (ref 36.0–46.0)
Hemoglobin: 13.7 g/dL (ref 12.0–15.0)
Immature Granulocytes: 1 %
Lymphocytes Relative: 53 %
Lymphs Abs: 2.5 10*3/uL (ref 0.7–4.0)
MCH: 32.5 pg (ref 26.0–34.0)
MCHC: 34.3 g/dL (ref 30.0–36.0)
MCV: 94.8 fL (ref 80.0–100.0)
Monocytes Absolute: 0.7 10*3/uL (ref 0.1–1.0)
Monocytes Relative: 16 %
Neutro Abs: 1.2 10*3/uL — ABNORMAL LOW (ref 1.7–7.7)
Neutrophils Relative %: 26 %
Platelet Count: 350 10*3/uL (ref 150–400)
RBC: 4.21 MIL/uL (ref 3.87–5.11)
RDW: 13.4 % (ref 11.5–15.5)
WBC Count: 4.6 10*3/uL (ref 4.0–10.5)
nRBC: 0 % (ref 0.0–0.2)

## 2018-10-31 LAB — CMP (CANCER CENTER ONLY)
ALT: 41 U/L (ref 0–44)
AST: 26 U/L (ref 15–41)
Albumin: 4 g/dL (ref 3.5–5.0)
Alkaline Phosphatase: 108 U/L (ref 38–126)
Anion gap: 11 (ref 5–15)
BUN: 19 mg/dL (ref 6–20)
CO2: 22 mmol/L (ref 22–32)
Calcium: 9.1 mg/dL (ref 8.9–10.3)
Chloride: 106 mmol/L (ref 98–111)
Creatinine: 0.92 mg/dL (ref 0.44–1.00)
GFR, Est AFR Am: >60 mL/min (ref >60)
GFR, Estimated: >60 mL/min (ref >60)
Glucose, Bld: 266 mg/dL — ABNORMAL HIGH (ref 70–99)
Potassium: 4.1 mmol/L (ref 3.5–5.1)
Sodium: 139 mmol/L (ref 135–145)
Total Bilirubin: 0.3 mg/dL (ref 0.3–1.2)
Total Protein: 7.2 g/dL (ref 6.5–8.1)

## 2018-10-31 NOTE — Assessment & Plan Note (Signed)
Right breast irregular mass lower inner quadrant retroareolar 02/19/2015: 2.2 x 1.3 x 1.7 cm with architectural distortion and skin/nipple retraction, T2 N0 stage II a clinical stage, additional benign cysts largest 1.2 cm Right breast biopsy 02/20/2015 5:00: Invasive ductal carcinoma grade 2, perineural invasion present, ER 90%, PR 70%, HER-2 negative ratio 0.95, Ki-67 15%   Breast MRI 03/07/2015: Subareolar mass 2.2 x 2.4 x 2.5 cm extending into the retracted nipple, extending inferiorly and laterally over a distance of 4 cm, several level I right axillary lymph nodes with cortical thickening up to 7 mm with a maximum diameter of 15 mm --------------------------------------------------------------------- Treatment summary: Anastrozole with Delton See started April 2020 Ibrance added 09/29/2018 Bone scan: 09/20/2018: Progression of bone metastatic disease new metastases disease right lesser trochanter, mid left femoral diaphysis, additional bone mets involving left frontal, right iliac, right SI joint, right sixth rib, 12th rib, T12 transverse process and T10  Ibrance toxicities: No adverse effects  Lab review:WBC; 6.4, Hb 14.2  Return to clinic in 4 weeks for labs and follow-up.

## 2018-11-03 ENCOUNTER — Telehealth: Payer: Self-pay

## 2018-11-03 MED ORDER — AMOXICILLIN 500 MG PO CAPS: 1000.0000 mg | ORAL_CAPSULE | Freq: Once | ORAL | 0 refills | Status: AC

## 2018-11-03 NOTE — Telephone Encounter (Signed)
Medical clearance needed for upcoming tooth extraction by Dr. Graciella Freer.   RN reviewed with MD. MD recommendations to have extraction after off week of Ibrance.  Prophylactic antibiotic Amoxicillin 2 grams PO X 1 before extraction.   RN spoke with patient.  Pt is currently off Ibrance due to upcoming surgery.  Extraction is scheduled for 9/15, and patient has been off Ibrance since 9/1 as recommended due to upcoming right breast mastectomy.  Pt aware of prophylactic antibiotic.  Medical clearance letter faxed successfully to Dr. Rozann Lesches office

## 2018-11-09 NOTE — Progress Notes (Signed)
[place], [place] - [address], [id number] [place] [address] Phone: [phone] Fax: [phone]  [id number] - [place], [place] [facility] [place] [address] Phone: [phone] Fax: [phone]  [place], [place] [address] Phone: [phone] Fax: [phone]      Your procedure is scheduled on November 16, 2018.  Report to [facility] Main Entrance "A" at 5:30 A.M., and check in at the Admitting office.  Call this number if you have problems the morning of surgery:  [phone]  Call [phone] if you have any questions prior to your surgery date Monday-Friday 8am-4pm    Remember:  Do not eat or drink after midnight the night before your surgery     Take these medicines the morning of surgery with A SIP OF WATER: amoxicillin (AMOXIL) cetirizine (ZYRTEC) letrozole (FEMARA) palbociclib (IBRANCE)  Take these medications if needed:    prochlorperazine (COMPAZINE)   WHAT DO I DO ABOUT MY DIABETES MEDICATION?   Marland Kitchen Do not take oral diabetes medicines (pills) the morning of surgery.  . THE NIGHT BEFORE SURGERY:  Do NOT take evening dose of  insulin regular (HUMULIN R)       . The day of surgery, do not take other diabetes injectables, including Byetta (exenatide), Bydureon (exenatide ER), Victoza (liraglutide), or Trulicity (dulaglutide).  . If your CBG is greater than 220 mg/dL, you may take  of your sliding scale (correction) dose of insulin.   How to Manage Your Diabetes Before and After Surgery  Why is it important to control my blood sugar before and after surgery? . Improving blood sugar levels before and after surgery helps healing and can limit problems. . A way of improving blood sugar control is eating a healthy diet by: o  Eating less sugar and carbohydrates o   Increasing activity/exercise o  Talking with your doctor about reaching your blood sugar goals . High blood sugars (greater than 180 mg/dL) can raise your risk of infections and slow your recovery, so you will need to focus on controlling your diabetes during the weeks before surgery. . Make sure that the doctor who takes care of your diabetes knows about your planned surgery including the date and location.  How do I manage my blood sugar before surgery? . Check your blood sugar at least 4 times a day, starting 2 days before surgery, to make sure that the level is not too high or low. o Check your blood sugar the morning of your surgery when you wake up and every 2 hours until you get to the Short Stay unit. . If your blood sugar is less than 70 mg/dL, you will need to treat for low blood sugar: o Do not take insulin. o Treat a low blood sugar (less than 70 mg/dL) with  cup of clear juice (cranberry or apple), 4 glucose tablets, OR glucose gel. o Recheck blood sugar in 15 minutes after treatment (to make sure it is greater than 70 mg/dL). If your blood sugar is not greater than 70 mg/dL on recheck, call [phone] for further instructions. . Report your blood sugar to the short stay nurse when you get to Short Stay.  . If you are admitted to the hospital after surgery: o Your blood sugar will be checked by the staff and you will probably be given insulin after  surgery (instead of oral diabetes medicines) to make sure you have good blood sugar levels. o The goal for blood sugar control after surgery is 80-180 mg/dL.       As of today, STOP taking any Aspirin (unless otherwise instructed by your surgeon), Aleve, Naproxen, Ibuprofen, Motrin, Advil, Goody's, BC's, all herbal medications, fish oil, and all vitamins.    The Morning of Surgery  Do not wear jewelry, make-up or nail polish.  Do not wear lotions, powders, or perfumes, or deodorant  Do not shave 48 hours prior to surgery.     Do not bring valuables to the hospital.  [facility] is not responsible for any belongings or valuables.  If you are a smoker, DO NOT Smoke 24 hours prior to surgery IF you wear a CPAP at night please bring your mask, tubing, and machine the morning of surgery   Remember that you must have someone to transport you home after your surgery, and remain with you for 24 hours if you are discharged the same day.   Contacts, glasses, hearing aids, dentures or bridgework may not be worn into surgery.    Leave your suitcase in the car.  After surgery it may be brought to your room.  For patients admitted to the hospital, discharge time will be determined by your treatment team.  Patients discharged the day of surgery will not be allowed to drive home.    Special instructions:   [facility]- Preparing For Surgery  Before surgery, you can play an important role. Because skin is not sterile, your skin needs to be as free of germs as possible. You can reduce the number of germs on your skin by washing with CHG (chlorahexidine gluconate) Soap before surgery.  CHG is an antiseptic cleaner which kills germs and bonds with the skin to continue killing germs even after washing.    Oral Hygiene is also important to reduce your risk of infection.  Remember - BRUSH YOUR TEETH THE MORNING OF SURGERY WITH YOUR REGULAR TOOTHPASTE  Please do not use if you have an allergy to CHG or antibacterial soaps. If your skin becomes reddened/irritated stop using the CHG.  Do not shave (including legs and underarms) for at least 48 hours prior to first CHG shower. It is OK to shave your face.  Please follow these instructions carefully.   1. Shower the NIGHT BEFORE SURGERY and the MORNING OF SURGERY with CHG Soap.   2. If you chose to wash your hair, wash your hair first as usual with your normal shampoo.  3. After you shampoo, rinse your hair and body thoroughly to remove the shampoo.  4. Use CHG as you would  any other liquid soap. You can apply CHG directly to the skin and wash gently with a scrungie or a clean washcloth.   5. Apply the CHG Soap to your body ONLY FROM THE NECK DOWN.  Do not use on open wounds or open sores. Avoid contact with your eyes, ears, mouth and genitals (private parts). Wash Face and genitals (private parts)  with your normal soap.   6. Wash thoroughly, paying special attention to the area where your surgery will be performed.  7. Thoroughly rinse your body with warm water from the neck down.  8. DO NOT shower/wash with your normal soap after using and rinsing off the CHG Soap.  9. Pat yourself dry with a CLEAN TOWEL.  10. Wear CLEAN PAJAMAS to bed the night before surgery, wear comfortable  clothes the morning of surgery  11. Place CLEAN SHEETS on your bed the night of your first shower and DO NOT SLEEP WITH PETS.    Day of Surgery:  Do not apply any deodorants/lotions. Please shower the morning of surgery with the CHG soap  Please wear clean clothes to the hospital/surgery center.   Remember to brush your teeth WITH YOUR REGULAR TOOTHPASTE.   Please read over the following fact sheets that you were given.

## 2018-11-10 ENCOUNTER — Encounter (HOSPITAL_COMMUNITY)
Admission: RE | Admit: 2018-11-10 | Discharge: 2018-11-10 | Disposition: A | Discharge status: Home / Self Care | Payer: Medicaid Other | Source: Ambulatory Visit | Attending: Surgery | Admitting: Surgery

## 2018-11-10 ENCOUNTER — Other Ambulatory Visit: Payer: Self-pay

## 2018-11-10 ENCOUNTER — Encounter (HOSPITAL_COMMUNITY): Payer: Self-pay

## 2018-11-10 DIAGNOSIS — Z01812 Encounter for preprocedural laboratory examination: Secondary | ICD-10-CM | POA: Insufficient documentation

## 2018-11-10 HISTORY — DX: Tachycardia, unspecified: R00.0

## 2018-11-10 HISTORY — DX: Unspecified osteoarthritis, unspecified site: M19.90

## 2018-11-10 HISTORY — DX: Other complications of anesthesia, initial encounter: T88.59XA

## 2018-11-10 HISTORY — DX: Nonrheumatic mitral (valve) prolapse: I34.1

## 2018-11-10 HISTORY — DX: Type 2 diabetes mellitus without complications: E11.9

## 2018-11-10 HISTORY — DX: Carpal tunnel syndrome, unspecified upper limb: G56.00

## 2018-11-10 LAB — BASIC METABOLIC PANEL
Anion gap: 11 (ref 5–15)
BUN: 20 mg/dL (ref 6–20)
CO2: 23 mmol/L (ref 22–32)
Calcium: 9.5 mg/dL (ref 8.9–10.3)
Chloride: 103 mmol/L (ref 98–111)
Creatinine, Ser: 0.76 mg/dL (ref 0.44–1.00)
GFR calc Af Amer: >60 mL/min (ref >60)
GFR calc non Af Amer: >60 mL/min (ref >60)
Glucose, Bld: 293 mg/dL — ABNORMAL HIGH (ref 70–99)
Potassium: 3.9 mmol/L (ref 3.5–5.1)
Sodium: 137 mmol/L (ref 135–145)

## 2018-11-10 LAB — CBC
HCT: 42.9 % (ref 36.0–46.0)
Hemoglobin: 15 g/dL (ref 12.0–15.0)
MCH: 34 pg (ref 26.0–34.0)
MCHC: 35 g/dL (ref 30.0–36.0)
MCV: 97.3 fL (ref 80.0–100.0)
Platelets: 380 10*3/uL (ref 150–400)
RBC: 4.41 MIL/uL (ref 3.87–5.11)
RDW: 14 % (ref 11.5–15.5)
WBC: 4.9 10*3/uL (ref 4.0–10.5)
nRBC: 0 % (ref 0.0–0.2)

## 2018-11-10 LAB — HEMOGLOBIN A1C
Hgb A1c MFr Bld: 8.3 % — ABNORMAL HIGH (ref 4.8–5.6)
Mean Plasma Glucose: 191.51 mg/dL

## 2018-11-10 LAB — GLUCOSE, CAPILLARY: Glucose-Capillary: 312 mg/dL — ABNORMAL HIGH (ref 70–99)

## 2018-11-10 NOTE — Progress Notes (Addendum)
[place], [place] - [address], [id number] [place] [address] Phone: [phone] Fax: [phone]  [id number] [place], [place] [address] Phone: [phone] Fax: [phone]  [place], [place] [address] Phone: [phone] Fax: [phone]      Your procedure is scheduled on November 16, 2018.  Report to [facility] Main Entrance "A" at 5:30 A.M., and check in at the Admitting office.  Call this number if you have problems the morning of surgery:  [phone]  Call [phone] if you have any questions prior to your surgery date Monday-Friday 8am-4pm   Remember:  Do not eat after midnight the night before your surgery   You may drink clear liquids until 04:30 am the morning of your surgery.  Clear liquids allowed are: Water, Non-Citrus Juices (without pulp), Carbonated Beverages, Clear Tea, Black Coffee Only, and Gatorade.    Take these medicines the morning of surgery with A SIP OF WATER:  cetirizine (ZYRTEC) Metoprolol (Toprol XL) prochlorperazine (COMPAZINE) if needed   WHAT DO I DO ABOUT MY DIABETES MEDICATION?  Marland Kitchen Do not take oral diabetes medicines (pills) the morning of surgery.  . THE MORNING OF SURGERY:  Do NOT take MORNING DOSE OF insulin regular (HUMULIN R)      . The day of surgery, do not take other diabetes injectables, including Byetta (exenatide), Bydureon (exenatide ER), Victoza (liraglutide), or Trulicity (dulaglutide).  . If your CBG is greater than 220 mg/dL, you may take  of your sliding scale (correction) dose of insulin.   How to Manage Your Diabetes Before and After Surgery  Why is it important to control my blood sugar before and after surgery? . Improving blood sugar levels before and after surgery helps healing and can limit  problems. . A way of improving blood sugar control is eating a healthy diet by: o  Eating less sugar and carbohydrates o  Increasing activity/exercise o  Talking with your doctor about reaching your blood sugar goals . High blood sugars (greater than 180 mg/dL) can raise your risk of infections and slow your recovery, so you will need to focus on controlling your diabetes during the weeks before surgery. . Make sure that the doctor who takes care of your diabetes knows about your planned surgery including the date and location.  How do I manage my blood sugar before surgery? . Check your blood sugar at least 4 times a day, starting 2 days before surgery, to make sure that the level is not too high or low. o Check your blood sugar the morning of your surgery when you wake up and every 2 hours until you get to the Short Stay unit. . If your blood sugar is less than 70 mg/dL, you will need to treat for low blood sugar: o Do not take insulin. o Treat a low blood sugar (less than 70 mg/dL) with  cup of clear juice (cranberry or apple), 4 glucose tablets, OR glucose gel. o Recheck blood sugar in 15 minutes after treatment (to make sure it is greater than 70 mg/dL). If your blood sugar is not greater than 70 mg/dL on recheck, call [phone] for further instructions. . Report your blood sugar to the short stay nurse when you get to Short Stay.  . If you are admitted to the hospital after surgery:  o Your blood sugar will be checked by the staff and you will probably be given insulin after surgery (instead of oral diabetes medicines) to make sure you have good blood sugar levels. o The goal for blood sugar control after surgery is 80-180 mg/dL.    As of today, STOP taking any Aspirin (unless otherwise instructed by your surgeon), Diclofenac (Voltaren), Aleve, Naproxen, Ibuprofen, Motrin, Advil, Goody's, BC's, all herbal medications, fish oil, and all vitamins.   The Morning of Surgery  Do not  wear jewelry, make-up or nail polish.  Do not wear lotions, powders, or perfumes, or deodorant  Do not shave 48 hours prior to surgery.    Do not bring valuables to the hospital.  [facility] is not responsible for any belongings or valuables.  If you are a smoker, DO NOT Smoke 24 hours prior to surgery IF you wear a CPAP at night please bring your mask, tubing, and machine the morning of surgery   Remember that you must have someone to transport you home after your surgery, and remain with you for 24 hours if you are discharged the same day.   Contacts, glasses, hearing aids, dentures or bridgework may not be worn into surgery.   Leave your suitcase in the car.  After surgery it may be brought to your room.  For patients admitted to the hospital, discharge time will be determined by your treatment team.  Patients discharged the day of surgery will not be allowed to drive home.    Special instructions:   [facility]- Preparing For Surgery  Before surgery, you can play an important role. Because skin is not sterile, your skin needs to be as free of germs as possible. You can reduce the number of germs on your skin by washing with CHG (chlorahexidine gluconate) Soap before surgery.  CHG is an antiseptic cleaner which kills germs and bonds with the skin to continue killing germs even after washing.    Oral Hygiene is also important to reduce your risk of infection.  Remember - BRUSH YOUR TEETH THE MORNING OF SURGERY WITH YOUR REGULAR TOOTHPASTE  Please do not use if you have an allergy to CHG or antibacterial soaps. If your skin becomes reddened/irritated stop using the CHG.  Do not shave (including legs and underarms) for at least 48 hours prior to first CHG shower. It is OK to shave your face.  Please follow these instructions carefully.   1. Shower the NIGHT BEFORE SURGERY and the MORNING OF SURGERY with CHG Soap.   2. If you chose to wash your hair, wash your hair first as usual  with your normal shampoo.  3. After you shampoo, rinse your hair and body thoroughly to remove the shampoo.  4. Use CHG as you would any other liquid soap. You can apply CHG directly to the skin and wash gently with a scrungie or a clean washcloth.   5. Apply the CHG Soap to your body ONLY FROM THE NECK DOWN.  Do not use on open wounds or open sores. Avoid contact with your eyes, ears, mouth and genitals (private parts). Wash Face and genitals (private parts)  with your normal soap.   6. Wash thoroughly, paying special attention to the area where your surgery will be performed.  7. Thoroughly rinse your body with warm water from the neck down.  8. DO NOT shower/wash with your normal soap after using and rinsing off the CHG Soap.  9. Pat yourself dry with a  CLEAN TOWEL.  10. Wear CLEAN PAJAMAS to bed the night before surgery, wear comfortable clothes the morning of surgery  11. Place CLEAN SHEETS on your bed the night of your first shower and DO NOT SLEEP WITH PETS.    Day of Surgery:  Do not apply any deodorants/lotions. Please shower the morning of surgery with the CHG soap  Please wear clean clothes to the hospital/surgery center.   Remember to brush your teeth WITH YOUR REGULAR TOOTHPASTE.   Please read over the following fact sheets that you were given.

## 2018-11-10 NOTE — Telephone Encounter (Signed)
Made in error

## 2018-11-10 NOTE — Progress Notes (Signed)
PCP: Molli Barrows, FNP Cardiologist: Dr. Lovena Le  EKG: 03/15/2018 CXR: n/a ECHO: 07/2010 Stress Test: 07/2010 Cardiac Cath: denies  CBG 312 during PAT appt. Reports she ate a cheese sandwich and peanut butter sandwich, drank water just prior to appt. Pt reports she has had an allergic reaction (chronic yeast infections) to Lantus and has not been taking. Only taking meal doses of Humulin R 10 units TID.  Has appt with PCP on 11/14/2018 to discuss insulin.  Baldwin Crown with anesthesia called during appt to make aware--will follow.  Fasting Blood Sugar-90-135 Checks Blood Sugar: Fasting in AM, then 3 times daily 2 hours after meals  Pt reports she has not been taking Metoprolol as prescribed--she stopped on her own and did not discuss with Dr. Lovena Le.  Medication education provided and encouraged not to abruptly stop medications without consulting MD.  Patient denies shortness of breath, fever, cough, and chest pain at PAT appointment.  Patient verbalized understanding of instructions provided today at the PAT appointment.  Patient asked to review instructions at home and day of surgery.

## 2018-11-13 ENCOUNTER — Telehealth: Payer: Self-pay

## 2018-11-13 ENCOUNTER — Other Ambulatory Visit (HOSPITAL_COMMUNITY)
Admission: RE | Admit: 2018-11-13 | Discharge: 2018-11-13 | Disposition: A | Discharge status: Home / Self Care | Payer: Medicaid Other | Source: Ambulatory Visit | Attending: Surgery | Admitting: Surgery

## 2018-11-13 DIAGNOSIS — Z01812 Encounter for preprocedural laboratory examination: Secondary | ICD-10-CM | POA: Diagnosis not present

## 2018-11-13 DIAGNOSIS — Z20828 Contact with and (suspected) exposure to other viral communicable diseases: Secondary | ICD-10-CM | POA: Diagnosis not present

## 2018-11-13 NOTE — Telephone Encounter (Signed)
Called patient to do their pre-visit COVID screening.  Call went to voicemail. Unable to do prescreening.  

## 2018-11-14 ENCOUNTER — Other Ambulatory Visit: Payer: Self-pay

## 2018-11-14 ENCOUNTER — Ambulatory Visit (INDEPENDENT_AMBULATORY_CARE_PROVIDER_SITE_OTHER): Payer: Medicaid Other | Admitting: Internal Medicine

## 2018-11-14 VITALS — BP 138/90 | HR 83 | Temp 97.5°F | Resp 17 | Ht 59.5 in | Wt 162.0 lb

## 2018-11-14 DIAGNOSIS — E1169 Type 2 diabetes mellitus with other specified complication: Secondary | ICD-10-CM

## 2018-11-14 DIAGNOSIS — E785 Hyperlipidemia, unspecified: Secondary | ICD-10-CM | POA: Diagnosis not present

## 2018-11-14 DIAGNOSIS — C50919 Malignant neoplasm of unspecified site of unspecified female breast: Secondary | ICD-10-CM | POA: Diagnosis not present

## 2018-11-14 DIAGNOSIS — R030 Elevated blood-pressure reading, without diagnosis of hypertension: Secondary | ICD-10-CM | POA: Diagnosis not present

## 2018-11-14 DIAGNOSIS — E1165 Type 2 diabetes mellitus with hyperglycemia: Secondary | ICD-10-CM | POA: Diagnosis not present

## 2018-11-14 DIAGNOSIS — Z794 Long term (current) use of insulin: Secondary | ICD-10-CM | POA: Diagnosis not present

## 2018-11-14 DIAGNOSIS — IMO0001 Reserved for inherently not codable concepts without codable children: Secondary | ICD-10-CM

## 2018-11-14 MED ORDER — INSULIN NPH (HUMAN) (ISOPHANE) 100 UNIT/ML ~~LOC~~ SUSP: 8.0000 [IU] | Freq: Every day | SUBCUTANEOUS | 11 refills | Status: DC

## 2018-11-14 NOTE — Patient Instructions (Signed)
Humulin and insulin 8 units at bedtime.  The night before your surgery, take 4 units instead of 8 unless told otherwise by the surgical nurse.  After you have healed from your surgery you should get a routine eye exam and consider getting colon cancer screening.

## 2018-11-14 NOTE — Progress Notes (Signed)
  Patient ID: Emily Wilson, female    DOB: 03/05/1965  MRN: [id number]  CC: Diabetes, Hyperlipidemia, and Hypothyroidism   Subjective: Emily Wilson is a 52 y.o. female who presents for chronic ds management.  Pt seen at [facility] Her concerns today include:  Patient with history of right sided breast CA ESR positive with bone mets, HL, DM, hyperthyroidism  Mastectomy scheduled in 2 days.  No problems with anesthesia in past.   DM:  Pt was on Lantus 30 units daily and 10 of Humulin R TID.  She stopped taking the Lantus over 1 mth ago because she was getting yeast vaginal infections and foul vaginal odor which she attributed to Lantus.  Recurrent yeast infections stopped after she discontinued taking the Lantus.  -reports BS readings were good on Lantus.  She reports being on Humulin N in the past with Humulin R and did well with this combination.  She would like to be placed on Humulin N instead of Lantus. -checking BS before BF and gives range 150-180s.   She sometimes checks 2 hrs after dinner  -Eating Habits:  "I have to eat what I can afford."  She bakes her meats, "I'm not a big fried person."  Drinks sweet tea.   Not very active since COVID.  Before COVID, she was walking about 15 mins daily.   Told to d/c Lipitor by pharmacist at [facility] because it " would not mix with the Ibrance."  Off the Lipitor x 6 wks.    Past hx of  Hyperthyroid during pregnancy.  Off med x 20 yrs.  Patient Active Problem List   Diagnosis Date Noted  . Bone metastasis (HCC) 05/31/2018  . Family history of breast cancer in female 03/13/2015  . Malignant neoplasm of lower-inner quadrant of right breast of female, estrogen receptor positive (HCC) 02/25/2015  . Diabetes mellitus type 2, uncontrolled, without complications (HCC) 02/25/2015  . HLD (hyperlipidemia) 02/25/2015  . Anxiety state 05/26/2009  . Hyperthyroidism 11/14/2006     Current Outpatient Medications on File Prior to Visit  Medication Sig  Dispense Refill  . Accu-Chek FastClix Lancets MISC USE TO TEST BLOOD SUGAR UP TO FOUR TIMES DAILY AS DIRECTED 204 each 3  . ACCU-CHEK GUIDE test strip USE TO CHECK FSBS UP TO 4 TIMES A DAY. DX: E11.65 100 strip 5  . Alpha-Lipoic Acid 200 MG CAPS Take 200 mg by mouth daily.    . Ascorbic Acid (VITAMIN C) 1000 MG tablet Take 1,000 mg by mouth daily.     . BD INSULIN SYRINGE U/F 31G X 5/16" 0.5 ML MISC USE TO ADMINISTER INSULIN 3 TIMES DAILY WITH MEALS    . Calcium Carbonate-Vitamin D (CALCIUM 600/VITAMIN D PO) Take 1 tablet by mouth daily.    . cetirizine (ZYRTEC) 10 MG tablet Take 10 mg by mouth daily.    . Cholecalciferol (VITAMIN D) 125 MCG (5000 UT) CAPS Take 10,000 Units by mouth daily.    . diclofenac (VOLTAREN) 75 MG EC tablet Take 75 mg by mouth 2 (two) times daily.     . ferrous sulfate 325 (65 FE) MG tablet Take 325 mg by mouth daily with breakfast.    . Insulin Pen Needle (NOVOFINE) 30G X 8 MM MISC Use once at night to inject insulin. E.11.9 100 each 3  . insulin regular (HUMULIN R) 100 units/mL injection Inject 0.1 mLs (10 Units total) into the skin 3 (three) times daily before meals. E.11.9. Hold dose for blood sugar   less than 125 10 mL 11  . letrozole (FEMARA) 2.5 MG tablet Take 1 tablet (2.5 mg total) by mouth daily. 90 tablet 3  . metoprolol succinate (TOPROL XL) 25 MG 24 hr tablet Take 1 tablet (25 mg total) by mouth daily. 90 tablet 3  . Milk Thistle 1000 MG CAPS Take 1,000 mg by mouth daily.     . Multiple Vitamin (MULTIVITAMIN WITH MINERALS) TABS tablet Take 1 tablet by mouth daily.    . prochlorperazine (COMPAZINE) 10 MG tablet Take 1 tablet (10 mg total) by mouth every 8 (eight) hours as needed for nausea or vomiting. 30 tablet 1  . SUPER B COMPLEX/C PO Take 1 tablet by mouth daily.    . zinc gluconate 50 MG tablet Take 50 mg by mouth daily.    . palbociclib (IBRANCE) 125 MG tablet Take 1 tablet (125 mg total) by mouth daily. Take for 21 days on, 7 days off, repeat every 28  days. (Patient not taking: Reported on 11/14/2018) 21 tablet 3   No current facility-administered medications on file prior to visit.     Allergies  Allergen Reactions  . Naproxen Shortness Of Breath  . Codeine Hives  . Hydrocodone-Acetaminophen Hives  . Lantus [Insulin Glargine]     Yeast Infections  . Meloxicam Other (See Comments)    Abdominal pain   . Propoxyphene N-Acetaminophen Hives  . Sulfonamide Derivatives Hives  . Tramadol Other (See Comments)    Abdominal Pain    Social History   Socioeconomic History  . Marital status: Divorced    Spouse name: Not on file  . Number of children: Not on file  . Years of education: Not on file  . Highest education level: Not on file  Occupational History  . Occupation: Cashier  Social Needs  . Financial resource strain: Not on file  . Food insecurity    Worry: Not on file    Inability: Not on file  . Transportation needs    Medical: Not on file    Non-medical: Not on file  Tobacco Use  . Smoking status: Former Smoker    Packs/day: 3.00    Years: 30.00    Pack years: 90.00    Types: Cigarettes    Quit date: 10/24/2011    Years since quitting: 7.0  . Smokeless tobacco: Never Used  Substance and Sexual Activity  . Alcohol use: No  . Drug use: Never    Comment: updated as of 09/06/2018 - pt reports no illegal drug use   . Sexual activity: Not Currently  Lifestyle  . Physical activity    Days per week: Not on file    Minutes per session: Not on file  . Stress: Not on file  Relationships  . Social connections    Talks on phone: Not on file    Gets together: Not on file    Attends religious service: Not on file    Active member of club or organization: Not on file    Attends meetings of clubs or organizations: Not on file    Relationship status: Not on file  . Intimate partner violence    Fear of current or ex partner: Not on file    Emotionally abused: Not on file    Physically abused: Not on file    Forced sexual  activity: Not on file  Other Topics Concern  . Not on file  Social History Narrative   In LTR w/boyfriend   Works restaurant, cashier      Family History  Problem Relation Age of Onset  . Diabetes Mother   . Bladder Cancer Mother 66       smoker  . Other Mother 39       TAH for unspecified reason  . Multiple sclerosis Mother   . Cancer Father 38       lymphatic/tonsil cancer - in remission; former smoker  . COPD Maternal Grandmother        smoker  . Emphysema Maternal Grandmother        smoker  . Stroke Maternal Grandfather   . Dementia Maternal Uncle   . Diabetes Paternal Grandmother   . COPD Maternal Uncle        smoker  . Multiple sclerosis Paternal Aunt   . Breast cancer Paternal Aunt        dx. late 38s - early 35s    Past Surgical History:  Procedure Laterality Date  . ABDOMINAL HYSTERECTOMY    . APPENDECTOMY    . CARPAL TUNNEL RELEASE    . GANGLION CYST EXCISION Right   . TUBAL LIGATION      ROS: Review of Systems Negative except as stated above  PHYSICAL EXAM: BP 138/90   Pulse 83   Temp (!) 97.5 F (36.4 C) (Temporal)   Resp 17   Ht 4' 11.5" (1.511 m)   Wt 162 lb (73.5 kg)   SpO2 95%   BMI 32.17 kg/m   Wt Readings from Last 3 Encounters:  11/14/18 162 lb (73.5 kg)  11/10/18 162 lb (73.5 kg)  10/31/18 163 lb 6.4 oz (74.1 kg)    Physical Exam  General appearance - alert, well appearing, and in no distress Mental status - normal mood, behavior, speech, dress, motor activity, and thought processes Eyes - pupils equal and reactive, extraocular eye movements intact Nose - normal and patent, no erythema, discharge or polyps Mouth - mucous membranes moist, pharynx normal without lesions Neck - supple, no significant adenopathy Chest - clear to auscultation, no wheezes, rales or rhonchi, symmetric air entry Heart - normal rate, regular rhythm, normal S1, S2, no murmurs, rubs, clicks or gallops Abdomen - soft, nontender, nondistended, no masses or  organomegaly Extremities -no lower extremity edema.  Good peripheral pulses. Diabetic Foot Exam - Simple   Simple Foot Form Visual Inspection No deformities, no ulcerations, no other skin breakdown bilaterally: Yes Sensation Testing Intact to touch and monofilament testing bilaterally: Yes Pulse Check Posterior Tibialis and Dorsalis pulse intact bilaterally: Yes Comments     Lab Results  Component Value Date   HGBA1C 8.3 (H) 11/10/2018   Lab Results  Component Value Date   TSH 1.310 05/10/2018     CMP Latest Ref Rng & Units 11/10/2018 10/31/2018 10/13/2018  Glucose 70 - 99 mg/dL 293(H) 266(H) 246(H)  BUN 6 - 20 mg/dL _0 Creatinine 0.44 - 1.00 mg/dL 0.76 0.92 0.93  Sodium 135 - 145 mmol/L 137 139 139  Potassium 3.5 - 5.1 mmol/L 3.9 4.1 4.2  Chloride 98 - 111 mmol/L 103 106 104  CO2 22 - 32 mmol/L _1 Calcium 8.9 - 10.3 mg/dL 9.5 9.1 9.2  Total Protein 6.5 - 8.1 g/dL - 7.2 7.0  Total Bilirubin 0.3 - 1.2 mg/dL - 0.3 0.6  Alkaline Phos 38 - 126 U/L - 108 136(H)  AST 15 - 41 U/L - 26 21  ALT 0 - 44 U/L - 41 36   Lipid Panel     Component Value  Date/Time   CHOL 297 (H) 03/16/2018 1222   TRIG 318 (H) 03/16/2018 1222   HDL 43 03/16/2018 1222   CHOLHDL 6.9 (H) 03/16/2018 1222   CHOLHDL 6 11/04/2015 1045   VLDL 52.6 (H) 11/04/2015 1045   LDLCALC 190 (H) 03/16/2018 1222   LDLDIRECT 210.0 11/04/2015 1045    CBC    Component Value Date/Time   WBC 4.9 11/10/2018 1103   RBC 4.41 11/10/2018 1103   HGB 15.0 11/10/2018 1103   HGB 13.7 10/31/2018 1514   HGB 15.4 03/16/2018 1222   HCT 42.9 11/10/2018 1103   HCT 44.9 03/16/2018 1222   PLT 380 11/10/2018 1103   PLT 350 10/31/2018 1514   PLT 440 03/16/2018 1222   MCV 97.3 11/10/2018 1103   MCV 90 03/16/2018 1222   MCH 34.0 11/10/2018 1103   MCHC 35.0 11/10/2018 1103   RDW 14.0 11/10/2018 1103   RDW 12.0 03/16/2018 1222   LYMPHSABS 2.5 10/31/2018 1514   LYMPHSABS 2.8 03/16/2018 1222   MONOABS 0.7 10/31/2018  1514   EOSABS 0.1 10/31/2018 1514   EOSABS 0.2 03/16/2018 1222   BASOSABS 0.1 10/31/2018 1514   BASOSABS 0.1 03/16/2018 1222    ASSESSMENT AND PLAN: 1. Uncontrolled type 2 diabetes mellitus without complication, with long-term current use of insulin ([place]) Advised patient that it is unlikely that Lantus was causing recurrent vaginal yeast infection.  More likely that uncontrolled diabetes was causing the the recurrent yeast infection. -I think Lantus or Levemir would be a better choice for basal insulin but since she is not willing to get back on Lantus we will go ahead and do the Humulin N.  She will continue Humulin R with meals.  Continue to monitor blood sugars. -A1c is not at goal but not at the level that surgery needs to be delayed particularly with her cancer diagnosis.  However I have told patient that uncontrolled blood sugars can adversely affect healing of surgical wounds. Dietary counseling given.  Advised to avoid sugary drinks. Encourage exercise as tolerated at least 3 times a week for 15 to 20 minutes - insulin NPH Human (HUMULIN N) 100 UNIT/ML injection; Inject 0.08 mLs (8 Units total) into the skin at bedtime.  Dispense: 10 mL; Refill: 11  2. Metastatic breast cancer [facility]) Plan for mastectomy later this week  3. Hyperlipidemia associated with type 2 diabetes mellitus ([place]) I will have to check with the pharmacist and see whether the Zetia can be used since statin cannot be used with Ibrance  4. Elevated blood pressure reading in office without diagnosis of hypertension Elevated today but in looking at blood pressure readings from other visits her blood pressure has been pretty good.  No need for medication at this time.  Advised to limit salt in the foods.    Patient was given the opportunity to ask questions.  Patient verbalized understanding of the plan and was able to repeat key elements of the plan.   No orders of the defined types were placed in this  encounter.    Requested Prescriptions   Signed Prescriptions Disp Refills  . insulin NPH Human (HUMULIN N) 100 UNIT/ML injection 10 mL 11    Sig: Inject 0.08 mLs (8 Units total) into the skin at bedtime.    Return in about 2 months (around 01/14/2019).  Karle Plumber, MD, FACP

## 2018-11-14 NOTE — Progress Notes (Signed)
Received call from Dr. Trevor Mace office that patient's insulin changed from humulin R to Humulin N.  Patient is to take 50% of her usual dose at bedtime the night before surgery which will be 4 units.

## 2018-11-14 NOTE — Progress Notes (Signed)
Had A1C drawn during pre-surgical labs.  Was told not to take Statin due to Chemo.  Stopped taking the Lantus on her own due to recurring yeast infections(discharge, itching, fishy odor). Was on Humulin N 5 units before with no issues. Wants to know if she can restart.

## 2018-11-15 LAB — NOVEL CORONAVIRUS, NAA (HOSP ORDER, SEND-OUT TO REF LAB; TAT 18-24 HRS): SARS-CoV-2, NAA: NOT DETECTED

## 2018-11-15 NOTE — H&P (Signed)
   Emily Wilson  Location: [facility] Patient #: [id number] DOB: Feb 09, 1966 Divorced / Language: Cleophus Molt / Race: White Female   History of Present Illness  The patient is a 53 year old female who presents with breast cancer. This is a 53 year old female with metastatic breast cancer. I saw her 3 and half years ago. She has been seen by the cancer center the refused most therapy at that time. She now has metastatic cancer as well as odor from the cancer starting to erode through the skin of the right breast. She has been interested in a mastectomy to control this aspect of her breast cancer. Again, she is still followed at the cancer center and has metastatic disease to her spine.  Patient Active Problem List   Diagnosis Date Noted  . Bone metastasis ([place]) 05/31/2018  . Family history of breast cancer in female 03/13/2015  . Malignant neoplasm of lower-inner quadrant of right breast of female, estrogen receptor positive ([place]) 02/25/2015  . Diabetes mellitus type 2, uncontrolled, without complications ([place]) 0000000  . HLD (hyperlipidemia) 02/25/2015  . Anxiety state 05/26/2009  . Hy     Allergies  Codeine Sulfate *ANALGESICS - OPIOID*  Hydrocodone-Acetaminophen *ANALGESICS - OPIOID*  Meloxicam *ANALGESICS - ANTI-INFLAMMATORY*  Naproxen *ANALGESICS - ANTI-INFLAMMATORY*  Sulfa 10 *OPHTHALMIC AGENTS*  TraMADol HCl *ANALGESICS - OPIOID*   Medication History Atorvastatin Calcium (20MG  Tablet, Oral) Active. Voltaren (75MG  Tablet DR, Oral) Active. Lantus SoloStar (100UNIT/ML Soln Pen-inj, Subcutaneous) Active. HumuLIN N (100UNIT/ML Suspension, Subcutaneous) Active. Metoprolol Succinate ER (25MG  Tablet ER 24HR, Oral) Active. Medications Reconciled  Vitals  Weight: 161.6 lb Height: 59in Body Surface Area: 1.68 m Body Mass Index: 32.64 kg/m  Pulse: 110 (Regular)  BP: 130/70(Sitting, Left Arm, Standard      Physical Exam The physical  exam findings are as follows: Note: On examination, there is a firm mass that is retroareolar in the right breast. There is significant retraction of the breast in this area. It is slightly fixed. There is no purulence or extensive drainage today. Lungs clear CV RRR Abdomen soft, NT    Assessment & Plan   BREAST CANCER, RIGHT (C50.911)  Impression: Again, she has metastatic disease and she understands that proceeding with a mastectomy is not curative and would just control her discomfort as well as the odor from the erosion through the skin. I discussed the surgical procedure in detail. We discussed the risk which include but is not limited to bleeding, infection, breakdown, the need for further procedures, the need for drains, etc.

## 2018-11-15 NOTE — Anesthesia Preprocedure Evaluation (Addendum)
Anesthesia Evaluation  Patient identified by MRN, date of birth, ID band Patient awake    Reviewed: Allergy & Precautions, NPO status , Patient's Chart, lab work & pertinent test results  Airway Mallampati: II  TM Distance: >3 FB Neck ROM: Full    Dental no notable dental hx.    Pulmonary neg pulmonary ROS, former smoker,    Pulmonary exam normal breath sounds clear to auscultation       Cardiovascular negative cardio ROS Normal cardiovascular exam Rhythm:Regular Rate:Normal     Neuro/Psych Anxiety negative neurological ROS  negative psych ROS   GI/Hepatic negative GI ROS, Neg liver ROS,   Endo/Other  negative endocrine ROSdiabetes, Type 2  Renal/GU negative Renal ROS  negative genitourinary   Musculoskeletal negative musculoskeletal ROS (+)   Abdominal (+) + obese,   Peds negative pediatric ROS (+)  Hematology negative hematology ROS (+)   Anesthesia Other Findings Breast Cancer  Reproductive/Obstetrics negative OB ROS                            Anesthesia Physical Anesthesia Plan  ASA: III  Anesthesia Plan: General   Post-op Pain Management:    Induction: Intravenous  PONV Risk Score and Plan: 3 and Ondansetron, Dexamethasone, Midazolam and Treatment may vary due to age or medical condition  Airway Management Planned: LMA  Additional Equipment:   Intra-op Plan:   Post-operative Plan: Extubation in OR  Informed Consent: I have reviewed the patients History and Physical, chart, labs and discussed the procedure including the risks, benefits and alternatives for the proposed anesthesia with the patient or authorized representative who has indicated his/her understanding and acceptance.     Dental advisory given  Plan Discussed with: CRNA  Anesthesia Plan Comments: (Uncontrolled IDDMII, pt reported at PAT that stopped lantus of her own volition as she felt she was having a  reaction to it. She was advised to discuss with PCP prior to surgery. She was seen by PCP Dr. Wynetta Emery 11/14/18 and her medications were adjusted and she was cleared for surgery. Per note "Advised patient that it is unlikely that Lantus was causing recurrent vaginal yeast infection.  More likely that uncontrolled diabetes was causing the the recurrent yeast infection. I think Lantus or Levemir would be a better choice for basal insulin but since she is not willing to get back on Lantus we will go ahead and do the Humulin N.  She will continue Humulin R with meals.  Continue to monitor blood sugars. A1c is not at goal but not at the level that surgery needs to be delayed particularly with her cancer diagnosis.  However I have told patient that uncontrolled blood sugars can adversely affect healing of surgical wounds."  Follows with Dr. Lovena Le for hx of SVT, controlled on toprol.    Nuclear stress 2012: Impression Exercise Capacity:  Lexiscan with no exercise. BP Response:  n/a Clinical Symptoms:  No chest pain. ECG Impression:  No significant ECG changes with Lexiscan. Comparison with Prior Nuclear Study: No images to compare  Overall Impression:  Normal stress nuclear study. EF: 67%   )       Anesthesia Quick Evaluation

## 2018-11-16 ENCOUNTER — Encounter (HOSPITAL_COMMUNITY): Payer: Self-pay

## 2018-11-16 ENCOUNTER — Ambulatory Visit (HOSPITAL_COMMUNITY): Payer: Medicaid Other | Admitting: Physician Assistant

## 2018-11-16 ENCOUNTER — Observation Stay (HOSPITAL_COMMUNITY)
Admission: RE | Admit: 2018-11-16 | Discharge: 2018-11-17 | Disposition: A | Discharge status: Home / Self Care | Payer: Medicaid Other | Attending: Surgery | Admitting: Surgery

## 2018-11-16 ENCOUNTER — Encounter (HOSPITAL_COMMUNITY)
Admission: RE | Disposition: A | Discharge status: Home / Self Care | Payer: Self-pay | Source: Home / Self Care | Attending: Surgery

## 2018-11-16 ENCOUNTER — Encounter: Payer: Self-pay | Admitting: Hematology and Oncology

## 2018-11-16 ENCOUNTER — Other Ambulatory Visit: Payer: Self-pay

## 2018-11-16 ENCOUNTER — Ambulatory Visit (HOSPITAL_COMMUNITY): Payer: Medicaid Other | Admitting: Anesthesiology

## 2018-11-16 DIAGNOSIS — Z79899 Other long term (current) drug therapy: Secondary | ICD-10-CM | POA: Diagnosis not present

## 2018-11-16 DIAGNOSIS — E785 Hyperlipidemia, unspecified: Secondary | ICD-10-CM | POA: Diagnosis not present

## 2018-11-16 DIAGNOSIS — C50311 Malignant neoplasm of lower-inner quadrant of right female breast: Secondary | ICD-10-CM | POA: Diagnosis not present

## 2018-11-16 DIAGNOSIS — Z791 Long term (current) use of non-steroidal anti-inflammatories (NSAID): Secondary | ICD-10-CM | POA: Insufficient documentation

## 2018-11-16 DIAGNOSIS — Z794 Long term (current) use of insulin: Secondary | ICD-10-CM | POA: Insufficient documentation

## 2018-11-16 DIAGNOSIS — C7951 Secondary malignant neoplasm of bone: Secondary | ICD-10-CM | POA: Insufficient documentation

## 2018-11-16 DIAGNOSIS — G8918 Other acute postprocedural pain: Secondary | ICD-10-CM | POA: Diagnosis not present

## 2018-11-16 DIAGNOSIS — E119 Type 2 diabetes mellitus without complications: Secondary | ICD-10-CM | POA: Diagnosis not present

## 2018-11-16 DIAGNOSIS — C50911 Malignant neoplasm of unspecified site of right female breast: Secondary | ICD-10-CM | POA: Diagnosis not present

## 2018-11-16 DIAGNOSIS — Z803 Family history of malignant neoplasm of breast: Secondary | ICD-10-CM | POA: Insufficient documentation

## 2018-11-16 DIAGNOSIS — Z9011 Acquired absence of right breast and nipple: Secondary | ICD-10-CM

## 2018-11-16 DIAGNOSIS — Z170 Estrogen receptor positive status [ER+]: Secondary | ICD-10-CM | POA: Insufficient documentation

## 2018-11-16 HISTORY — PX: TOTAL MASTECTOMY: SHX6129

## 2018-11-16 LAB — GLUCOSE, CAPILLARY
Glucose-Capillary: 142 mg/dL — ABNORMAL HIGH (ref 70–99)
Glucose-Capillary: 137 mg/dL — ABNORMAL HIGH (ref 70–99)
Glucose-Capillary: 102 mg/dL — ABNORMAL HIGH (ref 70–99)
Glucose-Capillary: 101 mg/dL — ABNORMAL HIGH (ref 70–99)
Glucose-Capillary: 212 mg/dL — ABNORMAL HIGH (ref 70–99)

## 2018-11-16 SURGERY — MASTECTOMY, SIMPLE
Anesthesia: General | Laterality: Right

## 2018-11-16 MED ORDER — KETAMINE HCL 50 MG/5ML IJ SOSY
PREFILLED_SYRINGE | INTRAMUSCULAR | Status: AC
  Filled 2018-11-16: qty 5

## 2018-11-16 MED ORDER — HYDROMORPHONE HCL 1 MG/ML IJ SOLN
INTRAMUSCULAR | Status: AC
  Filled 2018-11-16: qty 1

## 2018-11-16 MED ORDER — OXYCODONE HCL 5 MG PO TABS
5.0000 mg | ORAL_TABLET | ORAL | Status: DC | PRN
  Filled 2018-11-16: qty 1

## 2018-11-16 MED ORDER — OXYCODONE HCL 5 MG PO TABS
5.0000 mg | ORAL_TABLET | ORAL | Status: DC | PRN
  Administered 2018-11-16 – 2018-11-17 (×4): 5 mg via ORAL
  Filled 2018-11-16 (×2): qty 1

## 2018-11-16 MED ORDER — MIDAZOLAM HCL 5 MG/5ML IJ SOLN
INTRAMUSCULAR | Status: DC | PRN
  Administered 2018-11-16: 2 mg via INTRAVENOUS

## 2018-11-16 MED ORDER — DIPHENHYDRAMINE HCL 25 MG PO CAPS
25.0000 mg | ORAL_CAPSULE | Freq: Four times a day (QID) | ORAL | Status: DC | PRN
  Administered 2018-11-16: 25 mg via ORAL
  Filled 2018-11-16 (×2): qty 1

## 2018-11-16 MED ORDER — HYDROMORPHONE HCL 1 MG/ML IJ SOLN: 1.0000 mg | INTRAMUSCULAR | Status: DC | PRN

## 2018-11-16 MED ORDER — HYDROMORPHONE HCL 1 MG/ML IJ SOLN
0.2500 mg | INTRAMUSCULAR | Status: DC | PRN
  Administered 2018-11-16 (×2): 0.5 mg via INTRAVENOUS
  Administered 2018-11-16: 0.25 mg via INTRAVENOUS

## 2018-11-16 MED ORDER — GABAPENTIN 300 MG PO CAPS
300.0000 mg | ORAL_CAPSULE | Freq: Two times a day (BID) | ORAL | Status: DC
  Administered 2018-11-16 – 2018-11-17 (×3): 300 mg via ORAL
  Filled 2018-11-16 (×3): qty 1

## 2018-11-16 MED ORDER — ACETAMINOPHEN 500 MG PO TABS
ORAL_TABLET | ORAL | Status: AC
  Administered 2018-11-16: 1000 mg via ORAL
  Filled 2018-11-16: qty 2

## 2018-11-16 MED ORDER — POTASSIUM CHLORIDE IN NACL 20-0.9 MEQ/L-% IV SOLN
INTRAVENOUS | Status: DC
  Administered 2018-11-16: 14:00:00 via INTRAVENOUS
  Filled 2018-11-16: qty 1000

## 2018-11-16 MED ORDER — KETAMINE HCL 10 MG/ML IJ SOLN
INTRAMUSCULAR | Status: DC | PRN
  Administered 2018-11-16: 25 mg via INTRAVENOUS

## 2018-11-16 MED ORDER — GLYCOPYRROLATE PF 0.2 MG/ML IJ SOSY
PREFILLED_SYRINGE | INTRAMUSCULAR | Status: DC | PRN
  Administered 2018-11-16: .2 mg via INTRAVENOUS

## 2018-11-16 MED ORDER — INSULIN ASPART 100 UNIT/ML ~~LOC~~ SOLN
0.0000 [IU] | SUBCUTANEOUS | Status: DC
  Administered 2018-11-16: 5 [IU] via SUBCUTANEOUS
  Administered 2018-11-17 (×2): 2 [IU] via SUBCUTANEOUS

## 2018-11-16 MED ORDER — ACETAMINOPHEN 500 MG PO TABS
1000.0000 mg | ORAL_TABLET | ORAL | Status: AC
  Administered 2018-11-16: 06:00:00 1000 mg via ORAL

## 2018-11-16 MED ORDER — DIPHENHYDRAMINE HCL 12.5 MG/5ML PO ELIX
ORAL_SOLUTION | ORAL | Status: AC
  Filled 2018-11-16: qty 10

## 2018-11-16 MED ORDER — ONDANSETRON HCL 4 MG/2ML IJ SOLN
INTRAMUSCULAR | Status: DC | PRN
  Administered 2018-11-16: 4 mg via INTRAVENOUS

## 2018-11-16 MED ORDER — FENTANYL CITRATE (PF) 250 MCG/5ML IJ SOLN
INTRAMUSCULAR | Status: AC
  Filled 2018-11-16: qty 5

## 2018-11-16 MED ORDER — PROPOFOL 10 MG/ML IV BOLUS
INTRAVENOUS | Status: DC | PRN
  Administered 2018-11-16: 200 mg via INTRAVENOUS

## 2018-11-16 MED ORDER — OXYCODONE HCL 5 MG PO TABS: 5.0000 mg | ORAL_TABLET | Freq: Once | ORAL | Status: DC | PRN

## 2018-11-16 MED ORDER — 0.9 % SODIUM CHLORIDE (POUR BTL) OPTIME
TOPICAL | Status: DC | PRN
  Administered 2018-11-16: 1000 mL

## 2018-11-16 MED ORDER — OXYCODONE HCL 5 MG PO TABS
ORAL_TABLET | ORAL | Status: AC
  Filled 2018-11-16: qty 1

## 2018-11-16 MED ORDER — CHLORHEXIDINE GLUCONATE CLOTH 2 % EX PADS: 6.0000 | MEDICATED_PAD | Freq: Once | CUTANEOUS | Status: DC

## 2018-11-16 MED ORDER — PROPOFOL 10 MG/ML IV BOLUS
INTRAVENOUS | Status: AC
  Filled 2018-11-16: qty 20

## 2018-11-16 MED ORDER — MORPHINE SULFATE (PF) 2 MG/ML IV SOLN: 1.0000 mg | INTRAVENOUS | Status: DC | PRN

## 2018-11-16 MED ORDER — METHOCARBAMOL 500 MG PO TABS: 500.0000 mg | ORAL_TABLET | Freq: Four times a day (QID) | ORAL | Status: DC | PRN

## 2018-11-16 MED ORDER — FENTANYL CITRATE (PF) 100 MCG/2ML IJ SOLN
INTRAMUSCULAR | Status: DC | PRN
  Administered 2018-11-16: 100 ug via INTRAVENOUS
  Administered 2018-11-16: 50 ug via INTRAVENOUS

## 2018-11-16 MED ORDER — CEFAZOLIN SODIUM-DEXTROSE 2-4 GM/100ML-% IV SOLN
2.0000 g | INTRAVENOUS | Status: AC
  Administered 2018-11-16: 2 g via INTRAVENOUS

## 2018-11-16 MED ORDER — GABAPENTIN 300 MG PO CAPS
300.0000 mg | ORAL_CAPSULE | ORAL | Status: AC
  Administered 2018-11-16: 06:00:00 300 mg via ORAL

## 2018-11-16 MED ORDER — ROPIVACAINE HCL 5 MG/ML IJ SOLN
INTRAMUSCULAR | Status: DC | PRN
  Administered 2018-11-16: 30 mL via PERINEURAL

## 2018-11-16 MED ORDER — MEPERIDINE HCL 25 MG/ML IJ SOLN: 6.2500 mg | INTRAMUSCULAR | Status: DC | PRN

## 2018-11-16 MED ORDER — ONDANSETRON HCL 4 MG/2ML IJ SOLN: 4.0000 mg | Freq: Four times a day (QID) | INTRAMUSCULAR | Status: DC | PRN

## 2018-11-16 MED ORDER — LACTATED RINGERS IV SOLN
INTRAVENOUS | Status: DC | PRN
  Administered 2018-11-16: 07:00:00 via INTRAVENOUS

## 2018-11-16 MED ORDER — LIDOCAINE 2% (20 MG/ML) 5 ML SYRINGE
INTRAMUSCULAR | Status: DC | PRN
  Administered 2018-11-16: 100 mg via INTRAVENOUS

## 2018-11-16 MED ORDER — OXYCODONE HCL 5 MG/5ML PO SOLN: 5.0000 mg | Freq: Once | ORAL | Status: DC | PRN

## 2018-11-16 MED ORDER — GABAPENTIN 300 MG PO CAPS
ORAL_CAPSULE | ORAL | Status: AC
  Administered 2018-11-16: 300 mg via ORAL
  Filled 2018-11-16: qty 1

## 2018-11-16 MED ORDER — ONDANSETRON 4 MG PO TBDP: 4.0000 mg | ORAL_TABLET | Freq: Four times a day (QID) | ORAL | Status: DC | PRN

## 2018-11-16 MED ORDER — ENOXAPARIN SODIUM 40 MG/0.4ML ~~LOC~~ SOLN
40.0000 mg | SUBCUTANEOUS | Status: DC
  Administered 2018-11-17: 40 mg via SUBCUTANEOUS
  Filled 2018-11-16 (×2): qty 0.4

## 2018-11-16 MED ORDER — DIPHENHYDRAMINE HCL 50 MG/ML IJ SOLN
25.0000 mg | Freq: Four times a day (QID) | INTRAMUSCULAR | Status: DC | PRN
  Filled 2018-11-16: qty 0.5

## 2018-11-16 MED ORDER — MIDAZOLAM HCL 2 MG/2ML IJ SOLN
INTRAMUSCULAR | Status: AC
  Filled 2018-11-16: qty 2

## 2018-11-16 MED ORDER — PROMETHAZINE HCL 25 MG/ML IJ SOLN: 6.2500 mg | INTRAMUSCULAR | Status: DC | PRN

## 2018-11-16 SURGICAL SUPPLY — 44 items
ADH SKN CLS APL DERMABOND .7 (GAUZE/BANDAGES/DRESSINGS) ×1
APL PRP STRL LF DISP 70% ISPRP (MISCELLANEOUS) ×1
APPLIER CLIP 9.375 MED OPEN (MISCELLANEOUS)
APR CLP MED 9.3 20 MLT OPN (MISCELLANEOUS)
BINDER BREAST LRG (GAUZE/BANDAGES/DRESSINGS) ×1 IMPLANT
BINDER BREAST XLRG (GAUZE/BANDAGES/DRESSINGS) IMPLANT
CANISTER SUCT 3000ML PPV (MISCELLANEOUS) ×1 IMPLANT
CHLORAPREP W/TINT 26 (MISCELLANEOUS) ×2 IMPLANT
CLIP APPLIE 9.375 MED OPEN (MISCELLANEOUS) IMPLANT
COVER SURGICAL LIGHT HANDLE (MISCELLANEOUS) ×2 IMPLANT
COVER WAND RF STERILE (DRAPES) ×1 IMPLANT
DERMABOND ADVANCED (GAUZE/BANDAGES/DRESSINGS) ×1
DERMABOND ADVANCED .7 DNX12 (GAUZE/BANDAGES/DRESSINGS) ×1 IMPLANT
DRAIN CHANNEL 19F RND (DRAIN) ×2 IMPLANT
DRAPE LAPAROSCOPIC ABDOMINAL (DRAPES) ×2 IMPLANT
ELECT REM PT RETURN 9FT ADLT (ELECTROSURGICAL) ×2
ELECTRODE REM PT RTRN 9FT ADLT (ELECTROSURGICAL) ×1 IMPLANT
EVACUATOR SILICONE 100CC (DRAIN) ×2 IMPLANT
FILTER NEPTUNE SMOKE EVACUATOR (MISCELLANEOUS) IMPLANT
GAUZE SPONGE 4X4 12PLY STRL (GAUZE/BANDAGES/DRESSINGS) IMPLANT
GLOVE ECLIPSE 7.5 STRL STRAW (GLOVE) ×1 IMPLANT
GLOVE SURG SIGNA 7.5 PF LTX (GLOVE) ×1 IMPLANT
GLOVE TRIUMPH SURG SIZE 7.5 (KITS) ×1 IMPLANT
GOWN STRL REUS W/ TWL LRG LVL3 (GOWN DISPOSABLE) ×2 IMPLANT
GOWN STRL REUS W/ TWL XL LVL3 (GOWN DISPOSABLE) ×1 IMPLANT
GOWN STRL REUS W/TWL LRG LVL3 (GOWN DISPOSABLE) ×4
GOWN STRL REUS W/TWL XL LVL3 (GOWN DISPOSABLE) ×2
KIT BASIN OR (CUSTOM PROCEDURE TRAY) ×2 IMPLANT
KIT TURNOVER KIT B (KITS) ×2 IMPLANT
MANIFOLD NEPTUNE II (INSTRUMENTS) ×1 IMPLANT
NS IRRIG 1000ML POUR BTL (IV SOLUTION) ×2 IMPLANT
PACK GENERAL/GYN (CUSTOM PROCEDURE TRAY) ×2 IMPLANT
PAD ABD 8X10 STRL (GAUZE/BANDAGES/DRESSINGS) ×1 IMPLANT
PAD ARMBOARD 7.5X6 YLW CONV (MISCELLANEOUS) ×2 IMPLANT
PENCIL SMOKE EVACUATOR (MISCELLANEOUS) ×2 IMPLANT
SPECIMEN JAR X LARGE (MISCELLANEOUS) ×2 IMPLANT
STAPLER VISISTAT 35W (STAPLE) ×1 IMPLANT
SUT ETHILON 3 0 FSL (SUTURE) ×2 IMPLANT
SUT MNCRL AB 4-0 PS2 18 (SUTURE) ×2 IMPLANT
SUT SILK 2 0 SH (SUTURE) IMPLANT
SUT VIC AB 3-0 SH 18 (SUTURE) ×3 IMPLANT
TAPE CLOTH SURG 4X10 WHT LF (GAUZE/BANDAGES/DRESSINGS) ×1 IMPLANT
TOWEL GREEN STERILE (TOWEL DISPOSABLE) ×2 IMPLANT
TOWEL GREEN STERILE FF (TOWEL DISPOSABLE) ×2 IMPLANT

## 2018-11-16 NOTE — Anesthesia Procedure Notes (Signed)
Procedure Name: LMA Insertion Date/Time: 11/16/2018 7:44 AM Performed by: Marsa Aris, CRNA Pre-anesthesia Checklist: Patient identified, Emergency Drugs available, Suction available and Patient being monitored Patient Re-evaluated:Patient Re-evaluated prior to induction Oxygen Delivery Method: Circle System Utilized Preoxygenation: Pre-oxygenation with 100% oxygen Induction Type: IV induction Ventilation: Mask ventilation without difficulty LMA: LMA inserted LMA Size: 4.0 Number of attempts: 1 Airway Equipment and Method: Bite block Placement Confirmation: positive ETCO2 Tube secured with: Tape Dental Injury: Teeth and Oropharynx as per pre-operative assessment

## 2018-11-16 NOTE — Anesthesia Procedure Notes (Signed)
Anesthesia Regional Block: Pectoralis block   Pre-Anesthetic Checklist: ,, timeout performed, Correct Patient, Correct Site, Correct Laterality, Correct Procedure, Correct Position, site marked, Risks and benefits discussed,  Surgical consent,  Pre-op evaluation,  At surgeon's request and post-op pain management  Laterality: Right  Prep: chloraprep       Needles:  Injection technique: Single-shot  Needle Type: Stimiplex     Needle Length: 9cm  Needle Gauge: 21     Additional Needles:   Procedures:,,,, ultrasound used (permanent image in chart),,,,  Narrative:  Start time: 11/16/2018 7:01 AM End time: 11/16/2018 7:06 AM Injection made incrementally with aspirations every 5 mL.  Performed by: Personally  Anesthesiologist: Lynda Rainwater, MD

## 2018-11-16 NOTE — Anesthesia Postprocedure Evaluation (Signed)
Anesthesia Post Note  Patient: Emily Wilson  Procedure(s) Performed: RIGHT MASTECTOMY (Right )     Patient location during evaluation: PACU Anesthesia Type: General Level of consciousness: awake and alert Pain management: pain level controlled Vital Signs Assessment: post-procedure vital signs reviewed and stable Respiratory status: spontaneous breathing, nonlabored ventilation and respiratory function stable Cardiovascular status: blood pressure returned to baseline and stable Postop Assessment: no apparent nausea or vomiting Anesthetic complications: no    Last Vitals:  Vitals:   11/16/18 1015 11/16/18 1020  BP:  106/78  Pulse: 61 61  Resp: 11 16  Temp:    SpO2: 96% 93%    Last Pain:  Vitals:   11/16/18 1020  TempSrc:   PainSc: Asleep                 Lynda Rainwater

## 2018-11-16 NOTE — Transfer of Care (Signed)
Immediate Anesthesia Transfer of Care Note  Patient: Emily Wilson  Procedure(s) Performed: RIGHT MASTECTOMY (Right )  Patient Location: PACU  Anesthesia Type:GA combined with regional for post-op pain  Level of Consciousness: awake, alert  and oriented  Airway & Oxygen Therapy: Patient Spontanous Breathing  Post-op Assessment: Report given to RN and Post -op Vital signs reviewed and stable  Post vital signs: Reviewed and stable  Last Vitals:  Vitals Value Taken Time  BP 140/82 11/16/18 0847  Temp    Pulse 78 11/16/18 0848  Resp 15 11/16/18 0848  SpO2 94 % 11/16/18 0848  Vitals shown include unvalidated device data.  Last Pain:  Vitals:   11/16/18 0620  TempSrc: Oral  PainSc:          Complications: No apparent anesthesia complications

## 2018-11-16 NOTE — Op Note (Signed)
RIGHT MASTECTOMY  Procedure Note  Emily Wilson 11/16/2018   Pre-op Diagnosis: RIGHT BREAST CANCER     Post-op Diagnosis: same  Procedure(s): RIGHT MASTECTOMY  Surgeon(s): Coralie Keens, MD  Anesthesia: General  Staff:  Circulator: Mallie Snooks, RN Scrub Person: Rico Ala Circulator Assistant: Dineen Kid, RN  Estimated Blood Loss: Minimal               Specimens: sent to path  Indications: This is a 53 year old female with known metastatic breast cancer from a right breast mass.  She is now started having the cancer eroded through the skin at the nipple areolar complex so the decision was made to proceed with a right mastectomy  Procedure: The patient was brought to the operating room and identifies correct patient.  She is placed upon on the operating table general anesthesia was induced.  She had all received a pectoralis block by anesthesia.  Her right breast and chest were then prepped and draped in usual sterile fashion.  I performed an elliptical incision transversely across the right breast incorporating the nipple areolar complex and the palpable mass.  I then created superior and inferior skin flaps with electrocautery.  I did superior flap down to the chest wall and then moved toward the inferior flap taking it down past the inframammary ridge.  I then dissected the breast off of the chest wall moving medial to laterally including the pectoralis fascia.  The mass did not erode past the fascia and was not fixated to the chest wall.  I then completed the mastectomy laterally and sent the breast to pathology for evaluation.  We then irrigated the incision with normal saline.  Hemostasis appeared to be achieved.  I made a separate skin incision and placed a 19 [place] Blake drain to the wound.  I sewed this in place with a 2-0 nylon suture.  I then closed the subcutaneous tissue of the mastectomy site with interrupted 3-0 Vicryl sutures and and then closed skin  with a running 4-0 Monocryl.  Dermabond was then applied.  The patient was next placed in a breast binder.  The patient tolerated the procedure well.  All the counts were correct at the end of the procedure.  The patient was then extubated in the operating room and taken in a stable condition to the recovery room.          Coralie Keens   Date: 11/16/2018  Time: 8:41 AM

## 2018-11-16 NOTE — Interval H&P Note (Signed)
History and Physical Interval Note:no change in H and P  11/16/2018 7:06 AM  Emily Wilson  has presented today for surgery, with the diagnosis of RIGHT BREAST CANCER.  The various methods of treatment have been discussed with the patient and family. After consideration of risks, benefits and other options for treatment, the patient has consented to  Procedure(s): RIGHT MASTECTOMY (Right) as a surgical intervention.  The patient's history has been reviewed, patient examined, no change in status, stable for surgery.  I have reviewed the patient's chart and labs.  Questions were answered to the patient's satisfaction.     Coralie Keens

## 2018-11-16 NOTE — Progress Notes (Signed)
Visited with patient and her husband while on rounds. Patient was encouraged following surgery and was very appreciative of her attending healthcare staff. Will continue to provide spiritual care as needed.  Rev. [employer].

## 2018-11-16 NOTE — Progress Notes (Signed)
Patient's son approached this RN stating that "downstairs told me that me and my dad could swap out because I have to go to work." I informed the son of the visitation policy and that each patient was allowed one visitor for the duration of their stay. Patient stated "that's not what I was told downstairs" I reinforced to the patient that is the policy across the system and he could speak with my charge nurse Remo Lipps. Remo Lipps handled this matter moving forward. I received a call from the security guard Gerald Stabs who stated only one visitor is allowed per patient and the son could not return due to the father coming up to the unit. I reinforced my understanding of the policy and re-educated the patient and her visitor as well. Both patient and her visitor which is the son's father. Will continue to monitor this patient.

## 2018-11-17 DIAGNOSIS — C50311 Malignant neoplasm of lower-inner quadrant of right female breast: Secondary | ICD-10-CM | POA: Diagnosis not present

## 2018-11-17 LAB — GLUCOSE, CAPILLARY
Glucose-Capillary: 111 mg/dL — ABNORMAL HIGH (ref 70–99)
Glucose-Capillary: 129 mg/dL — ABNORMAL HIGH (ref 70–99)
Glucose-Capillary: 137 mg/dL — ABNORMAL HIGH (ref 70–99)

## 2018-11-17 MED ORDER — OXYCODONE HCL 5 MG PO TABS: 5.0000 mg | ORAL_TABLET | Freq: Four times a day (QID) | ORAL | 0 refills | Status: DC | PRN

## 2018-11-17 NOTE — Progress Notes (Signed)
Patient ID: Emily Wilson, female   DOB: 1965-09-25, 53 y.o.   MRN: [id number]   Comfortable Wants to go home today Right chest mastectomy site stable Drain serosang  Plan: Discharge home

## 2018-11-17 NOTE — Discharge Instructions (Signed)
[facility] [place] °[phone] ° °MASTECTOMY: POST OP INSTRUCTIONS ° °Always review your discharge instruction sheet given to you by the facility where your surgery was performed. °IF YOU HAVE DISABILITY OR FAMILY LEAVE FORMS, YOU MUST BRING THEM TO THE OFFICE FOR PROCESSING.   °DO NOT GIVE THEM TO YOUR DOCTOR. °A prescription for pain medication may be given to you upon discharge.  Take your pain medication as prescribed, if needed.  If narcotic pain medicine is not needed, then you may take acetaminophen (Tylenol) or ibuprofen (Advil) as needed. °1. Take your usually prescribed medications unless otherwise directed. °2. If you need a refill on your pain medication, please contact your pharmacy.  They will contact our office to request authorization.  Prescriptions will not be filled after 5pm or on week-ends. °3. You should follow a light diet the first few days after arrival home, such as soup and crackers, etc.  Resume your normal diet the day after surgery. °4. Most patients will experience some swelling and bruising on the chest and underarm.  Ice packs will help.  Swelling and bruising can take several days to resolve.  °5. It is common to experience some constipation if taking pain medication after surgery.  Increasing fluid intake and taking a stool softener (such as Colace) will usually help or prevent this problem from occurring.  A mild laxative (Milk of Magnesia or Miralax) should be taken according to package instructions if there are no bowel movements after 48 hours. °6. Unless discharge instructions indicate otherwise, leave your bandage dry and in place until your next appointment in 3-5 days.  You may take a limited sponge bath.  No tube baths or showers until the drains are removed.  You may have steri-strips (small skin tapes) in place directly over the incision.  These strips should be left on the skin for 7-10 days.  If your surgeon used skin glue on the incision, you may  shower in 24 hours.  The glue will flake off over the next 2-3 weeks.  Any sutures or staples will be removed at the office during your follow-up visit. °7. DRAINS:  If you have drains in place, it is important to keep a list of the amount of drainage produced each day in your drains.  Before leaving the hospital, you should be instructed on drain care.  Call our office if you have any questions about your drains. °8. ACTIVITIES:  You may resume regular (light) daily activities beginning the next day--such as daily self-care, walking, climbing stairs--gradually increasing activities as tolerated.  You may have sexual intercourse when it is comfortable.  Refrain from any heavy lifting or straining until approved by your doctor. °a. You may drive when you are no longer taking prescription pain medication, you can comfortably wear a seatbelt, and you can safely maneuver your car and apply brakes. °b. RETURN TO WORK:  __________________________________________________________ °9. You should see your doctor in the office for a follow-up appointment approximately 3-5 days after your surgery.  Your doctor’s nurse will typically make your follow-up appointment when she calls you with your pathology report.  Expect your pathology report 2-3 business days after your surgery.  You may call to check if you do not hear from us after three days.   °10. OTHER INSTRUCTIONS: ______________________________________________________________________________________________ ____________________________________________________________________________________________ °WHEN TO CALL YOUR DOCTOR: °1. Fever over 101.0 °2. Nausea and/or vomiting °3. Extreme swelling or bruising °4. Continued bleeding from incision. °5. Increased pain, redness, or drainage from the incision. °  The clinic staff is available to answer your questions during regular business hours.  Please don’t hesitate to call and ask to speak to one of the nurses for clinical  concerns.  If you have a medical emergency, go to the nearest emergency room or call 911.  A surgeon from [facility] is always on call at the hospital. °[address], [address], [place], [place]  [address] ? [address] [address], [place], [place]   [address] °[phone] ? [phone] ? FAX [phone] °Web site: [email or web address] °

## 2018-11-17 NOTE — Discharge Summary (Signed)
Physician Discharge Summary  Patient ID: Emily Wilson MRN: [id number] DOB/AGE: 53-30-1967 53 y.o.  Admit date: 11/16/2018 Discharge date: 11/17/2018  Admission Diagnoses:  Discharge Diagnoses:  Active Problems:   S/P mastectomy, right right breast cancer  Discharged Condition: good  Hospital Course: uneventful post op recovery.  Discharged home POD#1  Consults: None  Significant Diagnostic Studies:   Treatments: surgery: right mastectomy  Discharge Exam: Blood pressure 115/75, pulse 73, temperature 98.9 F (37.2 C), temperature source Oral, resp. rate 18, height 4\' 10"  (1.473 m), weight 77.1 kg, SpO2 95 %. Awake and alert Lungs clear Comfortable Right chest flaps viable, drain serosang  Disposition: Discharge disposition: 01-Home or Self Care        Allergies as of 11/17/2018      Reactions   Naproxen Shortness Of Breath   Codeine Hives   Hydrocodone-acetaminophen Hives   Lantus [insulin Glargine]    Yeast Infections   Meloxicam Other (See Comments)   Abdominal pain   Propoxyphene N-acetaminophen Hives   Sulfonamide Derivatives Hives   Tramadol Other (See Comments)   Abdominal Pain      Medication List    TAKE these medications   Accu-Chek FastClix Lancets Misc USE TO TEST BLOOD SUGAR UP TO FOUR TIMES DAILY AS DIRECTED   Accu-Chek Guide test strip Generic drug: glucose blood USE TO CHECK FSBS UP TO 4 TIMES A DAY. DX: E11.65   Alpha-Lipoic Acid 200 MG Caps Take 200 mg by mouth daily.   BD Insulin Syringe U/F 31G X 5/16" 0.5 ML Misc Generic drug: Insulin Syringe-Needle U-100 USE TO ADMINISTER INSULIN 3 TIMES DAILY WITH MEALS   CALCIUM 600/VITAMIN D PO Take 1 tablet by mouth daily.   cetirizine 10 MG tablet Commonly known as: ZYRTEC Take 10 mg by mouth daily.   diclofenac 75 MG EC tablet Commonly known as: VOLTAREN Take 75 mg by mouth 2 (two) times daily.   ferrous sulfate 325 (65 FE) MG tablet Take 325 mg by mouth daily with  breakfast.   insulin NPH Human 100 UNIT/ML injection Commonly known as: HumuLIN N Inject 0.08 mLs (8 Units total) into the skin at bedtime.   Insulin Pen Needle 30G X 8 MM Misc Commonly known as: NOVOFINE Use once at night to inject insulin. E.11.9   insulin regular 100 units/mL injection Commonly known as: HumuLIN R Inject 0.1 mLs (10 Units total) into the skin 3 (three) times daily before meals. E.11.9. Hold dose for blood sugar less than 125   letrozole 2.5 MG tablet Commonly known as: FEMARA Take 1 tablet (2.5 mg total) by mouth daily.   metoprolol succinate 25 MG 24 hr tablet Commonly known as: Toprol XL Take 1 tablet (25 mg total) by mouth daily.   Milk Thistle 1000 MG Caps Take 1,000 mg by mouth daily.   multivitamin with minerals Tabs tablet Take 1 tablet by mouth daily.   oxyCODONE 5 MG immediate release tablet Commonly known as: Oxy IR/ROXICODONE Take 1 tablet (5 mg total) by mouth every 6 (six) hours as needed for moderate pain, severe pain or breakthrough pain.   palbociclib 125 MG tablet Commonly known as: Ibrance Take 1 tablet (125 mg total) by mouth daily. Take for 21 days on, 7 days off, repeat every 28 days.   prochlorperazine 10 MG tablet Commonly known as: COMPAZINE Take 1 tablet (10 mg total) by mouth every 8 (eight) hours as needed for nausea or vomiting.   SUPER B COMPLEX/C PO Take 1 tablet by  mouth daily.   vitamin C 1000 MG tablet Take 1,000 mg by mouth daily.   Vitamin D 125 MCG (5000 UT) Caps Take 10,000 Units by mouth daily.   zinc gluconate 50 MG tablet Take 50 mg by mouth daily.      Follow-up Information    Coralie Keens, MD. Schedule an appointment as soon as possible for a visit on 11/24/2018.   Specialty: General Surgery Why: call to make appointment for next Friday Contact information: [place]   [address] [phone]           Signed: Coralie Keens 11/17/2018, 8:01 AM

## 2018-11-20 ENCOUNTER — Encounter (HOSPITAL_COMMUNITY): Payer: Self-pay | Admitting: Surgery

## 2018-11-28 ENCOUNTER — Ambulatory Visit (HOSPITAL_COMMUNITY): Payer: Medicaid Other

## 2018-11-28 MED FILL — IBRANCE 125 MG TABS: 125 | 28 days supply | Qty: 21 | Fill #2

## 2018-11-29 ENCOUNTER — Other Ambulatory Visit: Payer: Self-pay

## 2018-11-30 LAB — SURGICAL PATHOLOGY

## 2018-11-30 NOTE — Progress Notes (Signed)
Patient Care Team: Scot Jun, FNP as PCP - General (Family Medicine)  DIAGNOSIS:    ICD-10-CM   1. Malignant neoplasm of lower-inner quadrant of right breast of female, estrogen receptor positive ([place])  C50.311    Z17.0     SUMMARY OF ONCOLOGIC HISTORY: Oncology History  Malignant neoplasm of lower-inner quadrant of right breast of female, estrogen receptor positive ([place])  02/19/2015 Mammogram   Right breast irregular mass lower inner quadrant 2.2 x 1.3 x 1.7 cm with architectural distortion and skin/nipple retraction, T2 N0 stage II a clinical stage, additional benign cysts largest 1.2 cm   02/20/2015 Initial Diagnosis   Right breast biopsy 5:00: Invasive ductal carcinoma grade 2, perineural invasion present, ER 90%, PR 70%, HER-2 negative ratio 0.95, Ki-67 15%    03/07/2015 Breast MRI   right breast inferior subareolar mass measuring 2.2 x 2.4 x 2.5 cm with skin and nipple enhancement, extending inferiorly and laterally is intraductal enhancement over a distance of 4 cm, several level I right axillary lymph nodes with cortical thickening   03/13/2015 -  Anti-estrogen oral therapy   Neoadjuvant antiestrogen therapy with tamoxifen 20 mg daily (because the patient did not want to undergo surgery immediately for multiple family reasons) stopped in late 2017; anastrozole started 05/11/18   05/23/2018 Imaging   CT CAP: 2.9 cm spiculated soft tissue density central right breast, no evidence of soft tissue metastatic disease within the chest abdomen pelvis.  Subcentimeter sclerotic bone lesions T10, left pubis, right posterior ilium Bone scan: Foci of uptake left frontal calvarium, T-spine, right ilium   10/04/2018 -  Anti-estrogen oral therapy   Ibrance with anastrozole   11/16/2018 Surgery   Right mastectomy Ninfa Linden): IDC, grade 1, 5.2cm, grossly involving underlying skin, clear margins.     CHIEF COMPLIANT: Follow-up of metastatic breast s/p mastectomy, re-initiation of  Ibrance  INTERVAL HISTORY: Emily Wilson is a 53 y.o. with above-mentioned history of metastatic breast cancer currently on anastrozoleand Ibrance.She underwent a mastectomy on 11/16/18 with Dr. Ninfa Linden for which pathology confirmed invasive ductal carcinoma, grade 1, 5.2cm, grossly involving underlying skin, clear margins. She presents to the clinic todayfor re-initiation of Ibrance.  She is very sore and bruised from the recent surgery.  REVIEW OF SYSTEMS:   Constitutional: Denies fevers, chills or abnormal weight loss Eyes: Denies blurriness of vision Ears, nose, mouth, throat, and face: Denies mucositis or sore throat Respiratory: Denies cough, dyspnea or wheezes Cardiovascular: Denies palpitation, chest discomfort Gastrointestinal: Denies nausea, heartburn or change in bowel habits Skin: Denies abnormal skin rashes Lymphatics: Denies new lymphadenopathy or easy bruising Neurological: Denies numbness, tingling or new weaknesses Behavioral/Psych: Mood is stable, no new changes  Extremities: No lower extremity edema Breast: s/p right mastectomy All other systems were reviewed with the patient and are negative.  I have reviewed the past medical history, past surgical history, social history and family history with the patient and they are unchanged from previous note.  ALLERGIES:  is allergic to naproxen; codeine; hydrocodone-acetaminophen; lantus [insulin glargine]; meloxicam; propoxyphene n-acetaminophen; sulfonamide derivatives; and tramadol.  MEDICATIONS:  Current Outpatient Medications  Medication Sig Dispense Refill  . Accu-Chek FastClix Lancets MISC USE TO TEST BLOOD SUGAR UP TO FOUR TIMES DAILY AS DIRECTED 204 each 3  . ACCU-CHEK GUIDE test strip USE TO CHECK FSBS UP TO 4 TIMES A DAY. DX: E11.65 100 strip 5  . Alpha-Lipoic Acid 200 MG CAPS Take 200 mg by mouth daily.    . Ascorbic Acid (VITAMIN  C) 1000 MG tablet Take 1,000 mg by mouth daily.     . BD INSULIN SYRINGE U/F  31G X 5/16" 0.5 ML MISC USE TO ADMINISTER INSULIN 3 TIMES DAILY WITH MEALS    . Calcium Carbonate-Vitamin D (CALCIUM 600/VITAMIN D PO) Take 1 tablet by mouth daily.    . cetirizine (ZYRTEC) 10 MG tablet Take 10 mg by mouth daily.    . Cholecalciferol (VITAMIN D) 125 MCG (5000 UT) CAPS Take 10,000 Units by mouth daily.    . diclofenac (VOLTAREN) 75 MG EC tablet Take 75 mg by mouth 2 (two) times daily.     . ferrous sulfate 325 (65 FE) MG tablet Take 325 mg by mouth daily with breakfast.    . insulin NPH Human (HUMULIN N) 100 UNIT/ML injection Inject 0.08 mLs (8 Units total) into the skin at bedtime. 10 mL 11  . Insulin Pen Needle (NOVOFINE) 30G X 8 MM MISC Use once at night to inject insulin. E.11.9 100 each 3  . insulin regular (HUMULIN R) 100 units/mL injection Inject 0.1 mLs (10 Units total) into the skin 3 (three) times daily before meals. E.11.9. Hold dose for blood sugar less than 125 10 mL 11  . letrozole (FEMARA) 2.5 MG tablet Take 1 tablet (2.5 mg total) by mouth daily. 90 tablet 3  . metoprolol succinate (TOPROL XL) 25 MG 24 hr tablet Take 1 tablet (25 mg total) by mouth daily. 90 tablet 3  . Milk Thistle 1000 MG CAPS Take 1,000 mg by mouth daily.     . Multiple Vitamin (MULTIVITAMIN WITH MINERALS) TABS tablet Take 1 tablet by mouth daily.    Marland Kitchen oxyCODONE (OXY IR/ROXICODONE) 5 MG immediate release tablet Take 1 tablet (5 mg total) by mouth every 6 (six) hours as needed for moderate pain, severe pain or breakthrough pain. 30 tablet 0  . palbociclib (IBRANCE) 125 MG tablet Take 1 tablet (125 mg total) by mouth daily. Take for 21 days on, 7 days off, repeat every 28 days. (Patient not taking: Reported on 11/14/2018) 21 tablet 3  . prochlorperazine (COMPAZINE) 10 MG tablet Take 1 tablet (10 mg total) by mouth every 8 (eight) hours as needed for nausea or vomiting. 30 tablet 1  . SUPER B COMPLEX/C PO Take 1 tablet by mouth daily.    Marland Kitchen zinc gluconate 50 MG tablet Take 50 mg by mouth daily.     No  current facility-administered medications for this visit.     PHYSICAL EXAMINATION: ECOG PERFORMANCE STATUS: 1 - Symptomatic but completely ambulatory  Vitals:   12/01/18 1026  BP: 98/80  Pulse: 99  Resp: 18  Temp: 98.2 F (36.8 C)  SpO2: 100%   Filed Weights   12/01/18 1026  Weight: 159 lb (72.1 kg)    GENERAL: alert, no distress and comfortable SKIN: skin color, texture, turgor are normal, no rashes or significant lesions EYES: normal, Conjunctiva are pink and non-injected, sclera clear OROPHARYNX: no exudate, no erythema and lips, buccal mucosa, and tongue normal  NECK: supple, thyroid normal size, non-tender, without nodularity LYMPH: no palpable lymphadenopathy in the cervical, axillary or inguinal LUNGS: clear to auscultation and percussion with normal breathing effort HEART: regular rate & rhythm and no murmurs and no lower extremity edema ABDOMEN: abdomen soft, non-tender and normal bowel sounds MUSCULOSKELETAL: no cyanosis of digits and no clubbing  NEURO: alert & oriented x 3 with fluent speech, no focal motor/sensory deficits EXTREMITIES: No lower extremity edema  LABORATORY DATA:  I have  reviewed the data as listed CMP Latest Ref Rng & Units 11/10/2018 10/31/2018 10/13/2018  Glucose 70 - 99 mg/dL 293(H) 266(H) 246(H)  BUN 6 - 20 mg/dL _0 Creatinine 0.44 - 1.00 mg/dL 0.76 0.92 0.93  Sodium 135 - 145 mmol/L 137 139 139  Potassium 3.5 - 5.1 mmol/L 3.9 4.1 4.2  Chloride 98 - 111 mmol/L 103 106 104  CO2 22 - 32 mmol/L _1 Calcium 8.9 - 10.3 mg/dL 9.5 9.1 9.2  Total Protein 6.5 - 8.1 g/dL - 7.2 7.0  Total Bilirubin 0.3 - 1.2 mg/dL - 0.3 0.6  Alkaline Phos 38 - 126 U/L - 108 136(H)  AST 15 - 41 U/L - 26 21  ALT 0 - 44 U/L - 41 36    Lab Results  Component Value Date   WBC 10.8 (H) 12/01/2018   HGB 13.6 12/01/2018   HCT 40.9 12/01/2018   MCV 96.2 12/01/2018   PLT 444 (H) 12/01/2018   NEUTROABS 6.6 12/01/2018    ASSESSMENT & PLAN:  Malignant  neoplasm of lower-inner quadrant of right breast of female, estrogen receptor positive ([place]) Right breast irregular mass lower inner quadrant retroareolar 02/19/2015: 2.2 x 1.3 x 1.7 cm with architectural distortion and skin/nipple retraction, T2 N0 stage II a clinical stage, additional benign cysts largest 1.2 cm Right breast biopsy 02/20/2015 5:00: Invasive ductal carcinoma grade 2, perineural invasion present, ER 90%, PR 70%, HER-2 negative ratio 0.95, Ki-67 15%   Breast MRI 03/07/2015: Subareolar mass 2.2 x 2.4 x 2.5 cm extending into the retracted nipple, extending inferiorly and laterally over a distance of 4 cm, several level I right axillary lymph nodes with cortical thickening up to 7 mm with a maximum diameter of 15 mm --------------------------------------------------------------------- Treatment summary: Anastrozole with Xgeva started April 2020Ibrance added 09/29/2018 Bone scan: 09/20/2018: Progression of bone metastatic disease new metastases disease right lesser trochanter, mid left femoral diaphysis, additional bone mets involving left frontal, right iliac, right SI joint, right sixth rib, 12th rib, T12 transverse process and T10  Ibrancetoxicities:No adverse effects  11/16/2018:Right mastectomy Ninfa Linden) performed because the tumor was fungating: IDC, grade 1, 5.2cm, grossly involving underlying skin, clear margins.  Plan: Tumor board discussion recommended radiation Resume Ibrance after radiation is complete.  She will continue antiestrogen therapy throughout radiation. She will need scans to be done.  These were postponed because of her surgery. She has dental work to be performed in a couple of weeks.  I will see her at the end of radiation.   No orders of the defined types were placed in this encounter.  The patient has a good understanding of the overall plan. she agrees with it. she will call with any problems that may develop before the next visit here.  [name], MD 12/01/2018  Julious Oka Dorshimer am acting as scribe for Dr. [name].  I have reviewed the above documentation for accuracy and completeness, and I agree with the above.

## 2018-12-01 ENCOUNTER — Other Ambulatory Visit: Payer: Self-pay | Admitting: *Deleted

## 2018-12-01 ENCOUNTER — Inpatient Hospital Stay (HOSPITAL_BASED_OUTPATIENT_CLINIC_OR_DEPARTMENT_OTHER): Payer: Medicaid Other | Admitting: Hematology and Oncology

## 2018-12-01 ENCOUNTER — Inpatient Hospital Stay: Payer: Medicaid Other | Attending: Hematology and Oncology

## 2018-12-01 ENCOUNTER — Inpatient Hospital Stay: Payer: Medicaid Other

## 2018-12-01 ENCOUNTER — Other Ambulatory Visit: Payer: Self-pay

## 2018-12-01 DIAGNOSIS — C50311 Malignant neoplasm of lower-inner quadrant of right female breast: Secondary | ICD-10-CM | POA: Insufficient documentation

## 2018-12-01 DIAGNOSIS — C7951 Secondary malignant neoplasm of bone: Secondary | ICD-10-CM

## 2018-12-01 DIAGNOSIS — Z79811 Long term (current) use of aromatase inhibitors: Secondary | ICD-10-CM | POA: Diagnosis not present

## 2018-12-01 DIAGNOSIS — Z79899 Other long term (current) drug therapy: Secondary | ICD-10-CM | POA: Insufficient documentation

## 2018-12-01 DIAGNOSIS — E119 Type 2 diabetes mellitus without complications: Secondary | ICD-10-CM | POA: Insufficient documentation

## 2018-12-01 DIAGNOSIS — Z791 Long term (current) use of non-steroidal anti-inflammatories (NSAID): Secondary | ICD-10-CM | POA: Diagnosis not present

## 2018-12-01 DIAGNOSIS — Z794 Long term (current) use of insulin: Secondary | ICD-10-CM | POA: Diagnosis not present

## 2018-12-01 DIAGNOSIS — Z170 Estrogen receptor positive status [ER+]: Secondary | ICD-10-CM

## 2018-12-01 LAB — CMP (CANCER CENTER ONLY)
ALT: 30 U/L (ref 0–44)
AST: 20 U/L (ref 15–41)
Albumin: 3.5 g/dL (ref 3.5–5.0)
Alkaline Phosphatase: 111 U/L (ref 38–126)
Anion gap: 10 (ref 5–15)
BUN: 16 mg/dL (ref 6–20)
CO2: 25 mmol/L (ref 22–32)
Calcium: 9.5 mg/dL (ref 8.9–10.3)
Chloride: 107 mmol/L (ref 98–111)
Creatinine: 0.84 mg/dL (ref 0.44–1.00)
GFR, Est AFR Am: >60 mL/min (ref >60)
GFR, Estimated: >60 mL/min (ref >60)
Glucose, Bld: 201 mg/dL — ABNORMAL HIGH (ref 70–99)
Potassium: 4.4 mmol/L (ref 3.5–5.1)
Sodium: 142 mmol/L (ref 135–145)
Total Bilirubin: 0.3 mg/dL (ref 0.3–1.2)
Total Protein: 7.4 g/dL (ref 6.5–8.1)

## 2018-12-01 LAB — CBC WITH DIFFERENTIAL (CANCER CENTER ONLY)
Abs Immature Granulocytes: 0.07 10*3/uL (ref 0.00–0.07)
Basophils Absolute: 0.1 10*3/uL (ref 0.0–0.1)
Basophils Relative: 1 %
Eosinophils Absolute: 0.5 10*3/uL (ref 0.0–0.5)
Eosinophils Relative: 4 %
HCT: 40.9 % (ref 36.0–46.0)
Hemoglobin: 13.6 g/dL (ref 12.0–15.0)
Immature Granulocytes: 1 %
Lymphocytes Relative: 22 %
Lymphs Abs: 2.4 10*3/uL (ref 0.7–4.0)
MCH: 32 pg (ref 26.0–34.0)
MCHC: 33.3 g/dL (ref 30.0–36.0)
MCV: 96.2 fL (ref 80.0–100.0)
Monocytes Absolute: 1.1 10*3/uL — ABNORMAL HIGH (ref 0.1–1.0)
Monocytes Relative: 10 %
Neutro Abs: 6.6 10*3/uL (ref 1.7–7.7)
Neutrophils Relative %: 62 %
Platelet Count: 444 10*3/uL — ABNORMAL HIGH (ref 150–400)
RBC: 4.25 MIL/uL (ref 3.87–5.11)
RDW: 13.5 % (ref 11.5–15.5)
WBC Count: 10.8 10*3/uL — ABNORMAL HIGH (ref 4.0–10.5)
nRBC: 0 % (ref 0.0–0.2)

## 2018-12-01 MED ORDER — DENOSUMAB 120 MG/1.7ML ~~LOC~~ SOLN
SUBCUTANEOUS | Status: AC
  Filled 2018-12-01: qty 1.7

## 2018-12-01 MED ORDER — DENOSUMAB 120 MG/1.7ML ~~LOC~~ SOLN
120.0000 mg | Freq: Once | SUBCUTANEOUS | Status: AC
  Administered 2018-12-01: 12:00:00 120 mg via SUBCUTANEOUS

## 2018-12-01 NOTE — Patient Instructions (Signed)
Denosumab injection What is this medicine? DENOSUMAB (den oh sue mab) slows bone breakdown. Prolia is used to treat osteoporosis in women after menopause and in men, and in people who are taking corticosteroids for 6 months or more. Xgeva is used to treat a high calcium level due to cancer and to prevent bone fractures and other bone problems caused by multiple myeloma or cancer bone metastases. Xgeva is also used to treat giant cell tumor of the bone. This medicine may be used for other purposes; ask your health care provider or pharmacist if you have questions. COMMON BRAND NAME(S): Prolia, XGEVA What should I tell my health care provider before I take this medicine? They need to know if you have any of these conditions:  dental disease  having surgery or tooth extraction  infection  kidney disease  low levels of calcium or Vitamin D in the blood  malnutrition  on hemodialysis  skin conditions or sensitivity  thyroid or parathyroid disease  an unusual reaction to denosumab, other medicines, foods, dyes, or preservatives  pregnant or trying to get pregnant  breast-feeding How should I use this medicine? This medicine is for injection under the skin. It is given by a health care professional in a hospital or clinic setting. A special MedGuide will be given to you before each treatment. Be sure to read this information carefully each time. For Prolia, talk to your pediatrician regarding the use of this medicine in children. Special care may be needed. For Xgeva, talk to your pediatrician regarding the use of this medicine in children. While this drug may be prescribed for children as young as 13 years for selected conditions, precautions do apply. Overdosage: If you think you have taken too much of this medicine contact a poison control center or emergency room at once. NOTE: This medicine is only for you. Do not share this medicine with others. What if I miss a dose? It is  important not to miss your dose. Call your doctor or health care professional if you are unable to keep an appointment. What may interact with this medicine? Do not take this medicine with any of the following medications:  other medicines containing denosumab This medicine may also interact with the following medications:  medicines that lower your chance of fighting infection  steroid medicines like prednisone or cortisone This list may not describe all possible interactions. Give your health care provider a list of all the medicines, herbs, non-prescription drugs, or dietary supplements you use. Also tell them if you smoke, drink alcohol, or use illegal drugs. Some items may interact with your medicine. What should I watch for while using this medicine? Visit your doctor or health care professional for regular checks on your progress. Your doctor or health care professional may order blood tests and other tests to see how you are doing. Call your doctor or health care professional for advice if you get a fever, chills or sore throat, or other symptoms of a cold or flu. Do not treat yourself. This drug may decrease your body's ability to fight infection. Try to avoid being around people who are sick. You should make sure you get enough calcium and vitamin D while you are taking this medicine, unless your doctor tells you not to. Discuss the foods you eat and the vitamins you take with your health care professional. See your dentist regularly. Brush and floss your teeth as directed. Before you have any dental work done, tell your dentist you are   receiving this medicine. Do not become pregnant while taking this medicine or for 5 months after stopping it. Talk with your doctor or health care professional about your birth control options while taking this medicine. Women should inform their doctor if they wish to become pregnant or think they might be pregnant. There is a potential for serious side  effects to an unborn child. Talk to your health care professional or pharmacist for more information. What side effects may I notice from receiving this medicine? Side effects that you should report to your doctor or health care professional as soon as possible:  allergic reactions like skin rash, itching or hives, swelling of the face, lips, or tongue  bone pain  breathing problems  dizziness  jaw pain, especially after dental work  redness, blistering, peeling of the skin  signs and symptoms of infection like fever or chills; cough; sore throat; pain or trouble passing urine  signs of low calcium like fast heartbeat, muscle cramps or muscle pain; pain, tingling, numbness in the hands or feet; seizures  unusual bleeding or bruising  unusually weak or tired Side effects that usually do not require medical attention (report to your doctor or health care professional if they continue or are bothersome):  constipation  diarrhea  headache  joint pain  loss of appetite  muscle pain  runny nose  tiredness  upset stomach This list may not describe all possible side effects. Call your doctor for medical advice about side effects. You may report side effects to FDA at 1-800-FDA-1088. Where should I keep my medicine? This medicine is only given in a clinic, doctor's office, or other health care setting and will not be stored at home. NOTE: This sheet is a summary. It may not cover all possible information. If you have questions about this medicine, talk to your doctor, pharmacist, or health care provider.  2020 Elsevier/Gold Standard (2017-06-17 16:10:44)

## 2018-12-01 NOTE — Assessment & Plan Note (Addendum)
Right breast irregular mass lower inner quadrant retroareolar 02/19/2015: 2.2 x 1.3 x 1.7 cm with architectural distortion and skin/nipple retraction, T2 N0 stage II a clinical stage, additional benign cysts largest 1.2 cm Right breast biopsy 02/20/2015 5:00: Invasive ductal carcinoma grade 2, perineural invasion present, ER 90%, PR 70%, HER-2 negative ratio 0.95, Ki-67 15%   Breast MRI 03/07/2015: Subareolar mass 2.2 x 2.4 x 2.5 cm extending into the retracted nipple, extending inferiorly and laterally over a distance of 4 cm, several level I right axillary lymph nodes with cortical thickening up to 7 mm with a maximum diameter of 15 mm --------------------------------------------------------------------- Treatment summary: Anastrozole with Xgeva started April 2020Ibrance added 09/29/2018 Bone scan: 09/20/2018: Progression of bone metastatic disease new metastases disease right lesser trochanter, mid left femoral diaphysis, additional bone mets involving left frontal, right iliac, right SI joint, right sixth rib, 12th rib, T12 transverse process and T10  Ibrancetoxicities:No adverse effects  11/16/2018:Right mastectomy ([place]) performed because the tumor was fungating: IDC, grade 1, 5.2cm, grossly involving underlying skin, clear margins.  Plan: Tumor board discussion recommended radiation Resume Ibrance after radiation is complete.  She will continue antiestrogen therapy throughout radiation. Additional scans will be performed after radiation is complete.  

## 2018-12-04 ENCOUNTER — Telehealth: Payer: Self-pay | Admitting: Radiation Oncology

## 2018-12-04 NOTE — Telephone Encounter (Signed)
Contacted patient to verify telephone visit for pre reg °

## 2018-12-04 NOTE — Progress Notes (Signed)
Location of Breast Cancer: Right Breast  Histology per Pathology Report:  02/20/2015 Diagnosis Breast, right, needle core biopsy, 5 o'clock retroareolar - INVASIVE DUCTAL CARCINOMA, MSBR GRADE 2. - PERINEURAL INVASION PRESENT. - SEE MICROSCOPIC DESCRIPTION.  Receptor Status: ER (90%), PR (70%), Her 2- (NEG), Ki-67 (15%).  11/16/18 DIAGNOSIS: A. BREAST, RIGHT, MASTECTOMY: - Invasive ductal carcinoma, grade I/III, spanning 5.2 cm. - Carcinoma grossly involves overlying umbilicated skin. - The surgical resection margins are negative for carcinoma. - See oncology table below.   Did patient present with symptoms or was this found on screening mammography?: She presented in December 2016 with a mass to her right breast that had been present for around one year. She declined treatment at that time due to loss of insurance and family issues, and presents now after Right mastectomy completed on 11/16/18.  Past/Anticipated interventions by surgeon, if any: 11/16/18  Procedure(s): RIGHT MASTECTOMY Surgeon(s): Coralie Keens, MD   Past/Anticipated interventions by medical oncology, if any:  12/01/2018 Dr. Lindi Adie ASSESSMENT & PLAN:  Malignant neoplasm of lower-inner quadrant of right breast of female, estrogen receptor positive ([place]) Right breast irregular mass lower inner quadrant retroareolar 02/19/2015: 2.2 x 1.3 x 1.7 cm with architectural distortion and skin/nipple retraction, T2 N0 stage II a clinical stage, additional benign cysts largest 1.2 cm Right breast biopsy 02/20/2015 5:00: Invasive ductal carcinoma grade 2, perineural invasion present, ER 90%, PR 70%, HER-2 negative ratio 0.95, Ki-67 15%   Breast MRI 03/07/2015: Subareolar mass 2.2 x 2.4 x 2.5 cm extending into the retracted nipple, extending inferiorly and laterally over a distance of 4 cm, several level I right axillary lymph nodes with cortical thickening up to 7 mm with a maximum diameter of 15  mm --------------------------------------------------------------------- Treatment summary:Anastrozole with Delton See started April 2020Ibrance added 09/29/2018 Bone scan: 09/20/2018: Progression of bone metastatic disease new metastases disease right lesser trochanter, mid left femoral diaphysis, additional bone mets involving left frontal, right iliac, right SI joint, right sixth rib, 12th rib, T12 transverse process and T10  Ibrancetoxicities:No adverse effects  11/16/2018:Right mastectomy Ninfa [facility]) performed because the tumor was fungating: IDC, grade 1, 5.2cm, grossly involving underlying skin, clear margins.  Plan: Tumor board discussion recommended radiation Resume Ibrance after radiation is complete.  She will continue antiestrogen therapy throughout radiation. She will need scans to be done.  These were postponed because of her surgery. She has dental work to be performed in a couple of weeks.  I will see her at the end of radiation.   Lymphedema issues, if any:  She denies.   Pain issues, if any:  She reports soreness to her surgical site.   SAFETY ISSUES:  Prior radiation? No  Pacemaker/ICD? No  Possible current pregnancy? No  Is the patient on methotrexate? No  Current Complaints / other details:      Malmfelt, Stephani Police, RN 12/04/2018,4:50 PM

## 2018-12-05 ENCOUNTER — Other Ambulatory Visit: Payer: Self-pay

## 2018-12-05 ENCOUNTER — Ambulatory Visit
Admission: RE | Admit: 2018-12-05 | Discharge: 2018-12-05 | Disposition: A | Discharge status: Home / Self Care | Payer: Medicaid Other | Source: Ambulatory Visit | Attending: Radiation Oncology | Admitting: Radiation Oncology

## 2018-12-05 ENCOUNTER — Encounter: Payer: Self-pay | Admitting: Radiation Oncology

## 2018-12-05 DIAGNOSIS — C50311 Malignant neoplasm of lower-inner quadrant of right female breast: Secondary | ICD-10-CM | POA: Diagnosis not present

## 2018-12-05 DIAGNOSIS — Z170 Estrogen receptor positive status [ER+]: Secondary | ICD-10-CM | POA: Diagnosis not present

## 2018-12-05 DIAGNOSIS — Z9011 Acquired absence of right breast and nipple: Secondary | ICD-10-CM | POA: Diagnosis not present

## 2018-12-05 NOTE — Progress Notes (Signed)
Radiation Oncology         [phone] ________________________________  Name: Emily Wilson MRN: [id number]  Date: 12/05/2018  DOB: Apr 28, 1965  Follow-Up Visit Note by telephone as patient was unable to access WebEx during pandemic precautions  Outpatient  CC: Scot Jun, FNP  [name], MD  Diagnosis:      ICD-10-CM   1. Malignant neoplasm of lower-inner quadrant of right breast of female, estrogen receptor positive ([place])  C50.311    Z17.0      Cancer Staging Malignant neoplasm of lower-inner quadrant of right breast of female, estrogen receptor positive ([place]) Staging form: Breast, AJCC 7th Edition - Pathologic stage from 12/07/2018: Stage IV (yT4b, NX, M1) - Signed by Eppie Gibson, MD on 12/07/2018   CHIEF COMPLAINT: Here to discuss management of right breast cancer  Narrative:  The patient returns today for follow-up to discuss radiation treatment options. She was seen in consultation on 03/12/2015. At that time, however, she opted to put her breast surgery on hold due to family matters and insurance issues. Due to insurance issues, she also stopped her tamoxifen in late 2017. She was finally able to get back on insurance in early 2020 and returned to Dr. Julien Nordmann in 04/2018. She was started on anastrozole at that visit.   Since consultation date, she underwent repeat staging scans. Bilateral breast mammogram with right breast ultrasound on 05/17/2018 showed: enlargement of known right breast malignancy, now measuring 4.5 cm (previously 2.2 cm); two abnormal-appearing right axillary lymph nodes, new since 01/2015 [place]; no evidence of left breast malignancy.    Chest/abdomen/pelvis CT performed on 05/23/2018 showed no evidence of soft tissue metastatic disease. Some sub-centimeter sclerotic bone lesions were noted in the lower T-spine and pelvis. Bone scan performed at that time showed foci of abnormal increased tracer accumulation at the left frontal calvarium, thoracic  spine, and right iliac bone.  Surgery and recommended Ibrance were put on hold in light of the pandemic at that time. She returned on 08/31/2018 and was switched to letrozole because of concern of interaction of atorvastatin with anastrozole. She also opted to hold the Ibrance at that time due to anxiety over toxicities. She began on Xgeva due to bone metastases.   Most recent bone scan performed on 09/20/2018 showed progressive osseous metastatic disease. Following this scan, she opted to proceed with Ibrance.  She opted to proceed with right mastectomy on date of 11/16/2018 with pathology report revealing: tumor size of 5.2 cm; histology of invasive ductal carcinoma; margin status to invasive disease of greater than 0.2 cm; ER status: 50% positive (weak staining); PR status negative, Her2 status negative; Grade 1.  Symptomatically, the patient reports:  Lymphedema issues, if any:  She denies.   Pain issues, if any:  She reports soreness to her surgical site.  She has arthritis, she reports, with stable joint pain and back pain, but not of the sites are worsening nor do they correspond with the areas of bone metastases on her bone scan.  SAFETY ISSUES:  Prior radiation? No  Pacemaker/ICD? No  Possible current pregnancy? No  Is the patient on methotrexate? No         ALLERGIES:  is allergic to naproxen; codeine; hydrocodone-acetaminophen; lantus [insulin glargine]; meloxicam; propoxyphene n-acetaminophen; sulfonamide derivatives; and tramadol.  Meds: Current Outpatient Medications  Medication Sig Dispense Refill   Accu-Chek FastClix Lancets MISC USE TO TEST BLOOD SUGAR UP TO FOUR TIMES DAILY AS DIRECTED 204 each 3   ACCU-CHEK GUIDE  test strip USE TO CHECK FSBS UP TO 4 TIMES A DAY. DX: E11.65 100 strip 5   Alpha-Lipoic Acid 200 MG CAPS Take 200 mg by mouth daily.     Ascorbic Acid (VITAMIN C) 1000 MG tablet Take 1,000 mg by mouth daily.      BD INSULIN SYRINGE U/F 31G X 5/16" 0.5 ML  MISC USE TO ADMINISTER INSULIN 3 TIMES DAILY WITH MEALS     Calcium Carbonate-Vitamin D (CALCIUM 600/VITAMIN D PO) Take 1 tablet by mouth daily.     cetirizine (ZYRTEC) 10 MG tablet Take 10 mg by mouth daily.     Cholecalciferol (VITAMIN D) 125 MCG (5000 UT) CAPS Take 10,000 Units by mouth daily.     diclofenac (VOLTAREN) 75 MG EC tablet Take 75 mg by mouth 2 (two) times daily.      ferrous sulfate 325 (65 FE) MG tablet Take 325 mg by mouth daily with breakfast.     insulin NPH Human (HUMULIN N) 100 UNIT/ML injection Inject 0.08 mLs (8 Units total) into the skin at bedtime. 10 mL 11   Insulin Pen Needle (NOVOFINE) 30G X 8 MM MISC Use once at night to inject insulin. E.11.9 100 each 3   insulin regular (HUMULIN R) 100 units/mL injection Inject 0.1 mLs (10 Units total) into the skin 3 (three) times daily before meals. E.11.9. Hold dose for blood sugar less than 125 10 mL 11   letrozole (FEMARA) 2.5 MG tablet Take 1 tablet (2.5 mg total) by mouth daily. 90 tablet 3   metoprolol succinate (TOPROL XL) 25 MG 24 hr tablet Take 1 tablet (25 mg total) by mouth daily. 90 tablet 3   Milk Thistle 1000 MG CAPS Take 1,000 mg by mouth daily.      Multiple Vitamin (MULTIVITAMIN WITH MINERALS) TABS tablet Take 1 tablet by mouth daily.     oxyCODONE (OXY IR/ROXICODONE) 5 MG immediate release tablet Take 1 tablet (5 mg total) by mouth every 6 (six) hours as needed for moderate pain, severe pain or breakthrough pain. 30 tablet 0   prochlorperazine (COMPAZINE) 10 MG tablet Take 1 tablet (10 mg total) by mouth every 8 (eight) hours as needed for nausea or vomiting. 30 tablet 1   SUPER B COMPLEX/C PO Take 1 tablet by mouth daily.     zinc gluconate 50 MG tablet Take 50 mg by mouth daily.     palbociclib (IBRANCE) 125 MG tablet Take 1 tablet (125 mg total) by mouth daily. Take for 21 days on, 7 days off, repeat every 28 days. (Patient not taking: Reported on 11/14/2018) 21 tablet 3   No current  facility-administered medications for this encounter.     Physical Findings:  vitals were not taken for this visit. .       Lab Findings: Lab Results  Component Value Date   WBC 10.8 (H) 12/01/2018   HGB 13.6 12/01/2018   HCT 40.9 12/01/2018   MCV 96.2 12/01/2018   PLT 444 (H) 12/01/2018    Radiographic Findings: No results found.  Impression/Plan: Right Breast Cancer s/p Mastectomy, locally advanced. Bone mets (not clearly symptomatic).  We discussed adjuvant radiotherapy today.  I recommend 6 weeks directed at the right chest wall and regional nodes in order to reduce risk of locoregional recurrence.  The risks, benefits and side effects of this treatment were discussed in detail.  She understands that radiotherapy is associated with skin irritation and fatigue in the acute setting. Late effects can include cosmetic  changes and rare injury to internal organs.  She is enthusiastic about proceeding with treatment. Simulation will occur in a few days from today. A total of 5 medically necessary complex treatment devices will be fabricated and supervised by me: 4 fields with MLCs for custom blocks to protect heart, and lungs;  and, a Vac-lok. MORE COMPLEX DEVICES MAY BE MADE IN DOSIMETRY FOR FIELD IN FIELD BEAMS FOR DOSE HOMOGENEITY.  I have requested : 3D Simulation which is medically necessary to give adequate dose to at risk tissues while sparing lungs and heart.  I have requested a DVH of the following structures: lungs, heart, esophagus, spinal cord.  The patient will receive 50 Gy in 25 fractions to the right chest wall and regional nodes with 4 fields.  This will be followed by a boost.  This encounter was provided by telemedicine platform by telephone as patient was unable to access WebEx during pandemic precautions. The patient has given verbal consent for this type of encounter and has been advised to only accept a meeting of this type in a secure network environment. The time  spent during this encounter was over 20 minutes. The attendants for this meeting include Eppie Gibson  and [name].  During the encounter, Eppie Gibson was located at [facility] Radiation Oncology Department.  [name] was located at home.  _____________________________________   Eppie Gibson, MD   This document serves as a record of services personally performed by Eppie Gibson, MD. It was created on her behalf by Wilburn Mylar, a trained medical scribe. The creation of this record is based on the scribe's personal observations and the provider's statements to them. This document has been checked and approved by the attending provider.

## 2018-12-07 ENCOUNTER — Encounter: Payer: Self-pay | Admitting: Radiation Oncology

## 2018-12-08 ENCOUNTER — Ambulatory Visit: Payer: Medicaid Other | Admitting: Radiation Oncology

## 2018-12-12 ENCOUNTER — Other Ambulatory Visit: Payer: Self-pay

## 2018-12-12 ENCOUNTER — Encounter: Payer: Self-pay | Admitting: *Deleted

## 2018-12-12 ENCOUNTER — Ambulatory Visit
Admission: RE | Admit: 2018-12-12 | Discharge: 2018-12-12 | Disposition: A | Discharge status: Home / Self Care | Payer: Medicaid Other | Source: Ambulatory Visit | Attending: Radiation Oncology | Admitting: Radiation Oncology

## 2018-12-12 DIAGNOSIS — C50311 Malignant neoplasm of lower-inner quadrant of right female breast: Secondary | ICD-10-CM | POA: Diagnosis not present

## 2018-12-12 DIAGNOSIS — Z170 Estrogen receptor positive status [ER+]: Secondary | ICD-10-CM | POA: Diagnosis not present

## 2018-12-12 DIAGNOSIS — Z510 Encounter for antineoplastic radiation therapy: Secondary | ICD-10-CM | POA: Diagnosis not present

## 2018-12-12 NOTE — Progress Notes (Signed)
Medical clearance letter for tooth extraction faxed to Dr. Eulas Post at [phone]

## 2018-12-12 NOTE — Progress Notes (Addendum)
  Radiation Oncology         [phone] ________________________________  Name: Emily Wilson MRN: [id number]  Date: 12/12/2018  DOB: 03-21-1965  SIMULATION AND TREATMENT PLANNING NOTE    Outpatient  DIAGNOSIS:     ICD-10-CM   1. Malignant neoplasm of lower-inner quadrant of right breast of female, estrogen receptor positive ([place])  C50.311    Z17.0     NARRATIVE:  The patient was brought to the [facility].  Identity was confirmed.  All relevant records and images related to the planned course of therapy were reviewed.  The patient freely provided informed written consent to proceed with treatment after reviewing the details related to the planned course of therapy. The consent form was witnessed and verified by the simulation staff.    Then, the patient was set-up in a stable reproducible supine position for radiation therapy with her ipsilateral arm over her head, and her upper body secured in a custom-made Vac-lok device.  CT images were obtained.  Surface markings were placed.  The CT images were loaded into the planning software.    TREATMENT PLANNING NOTE: Treatment planning then occurred.  The radiation prescription was entered and confirmed.     A total of 5 medically necessary complex treatment devices were fabricated and supervised by me: 4 fields with MLCs for custom blocks to protect heart, and lungs;  and, a Vac-lok. MORE COMPLEX DEVICES MAY BE MADE IN DOSIMETRY FOR FIELD IN FIELD BEAMS FOR DOSE HOMOGENEITY.  I have requested : 3D Simulation which is medically necessary to give adequate dose to at risk tissues while sparing lungs and heart.  I have requested a DVH of the following structures: lungs, heart, esophagus, cord.    The patient will receive 50 Gy in 25 fractions to the right chest wall and regional nodes with 4  fields.  This will be followed by a boost.  Optical Surface Tracking Plan:  Since intensity modulated radiotherapy (IMRT) and 3D  conformal radiation treatment methods are predicated on accurate and precise positioning for treatment, intrafraction motion monitoring is medically necessary to ensure accurate and safe treatment delivery. The ability to quantify intrafraction motion without excessive ionizing radiation dose can only be performed with optical surface tracking. Accordingly, surface imaging offers the opportunity to obtain 3D measurements of patient position throughout IMRT and 3D treatments without excessive radiation exposure. I am ordering optical surface tracking for this patient's upcoming course of radiotherapy.  ________________________________   Reference:  Ursula Alert, J, et al. Surface imaging-based analysis of intrafraction motion for breast radiotherapy patients.Journal of [place], n. 6, nov. 2014. ISSN [id number].  Available at: <http://www.jacmp.org/index.php/jacmp/article/view/4957>.    -----------------------------------  Eppie Gibson, MD

## 2018-12-13 DIAGNOSIS — K029 Dental caries, unspecified: Secondary | ICD-10-CM | POA: Diagnosis not present

## 2018-12-15 DIAGNOSIS — Z170 Estrogen receptor positive status [ER+]: Secondary | ICD-10-CM | POA: Diagnosis not present

## 2018-12-15 DIAGNOSIS — C50311 Malignant neoplasm of lower-inner quadrant of right female breast: Secondary | ICD-10-CM | POA: Diagnosis not present

## 2018-12-15 DIAGNOSIS — Z510 Encounter for antineoplastic radiation therapy: Secondary | ICD-10-CM | POA: Diagnosis not present

## 2018-12-18 ENCOUNTER — Ambulatory Visit
Admission: RE | Admit: 2018-12-18 | Discharge: 2018-12-18 | Disposition: A | Discharge status: Home / Self Care | Payer: Medicaid Other | Source: Ambulatory Visit | Attending: Radiation Oncology | Admitting: Radiation Oncology

## 2018-12-18 DIAGNOSIS — C50311 Malignant neoplasm of lower-inner quadrant of right female breast: Secondary | ICD-10-CM

## 2018-12-18 DIAGNOSIS — Z510 Encounter for antineoplastic radiation therapy: Secondary | ICD-10-CM | POA: Diagnosis not present

## 2018-12-18 DIAGNOSIS — Z170 Estrogen receptor positive status [ER+]: Secondary | ICD-10-CM | POA: Diagnosis not present

## 2018-12-18 MED ORDER — RADIAPLEXRX EX GEL
Freq: Once | CUTANEOUS | Status: AC
  Administered 2018-12-18: 18:00:00 via TOPICAL

## 2018-12-18 MED ORDER — ALRA NON-METALLIC DEODORANT (RAD-ONC)
1.0000 "application " | Freq: Once | TOPICAL | Status: AC
  Administered 2018-12-18: 1 via TOPICAL

## 2018-12-18 NOTE — Progress Notes (Signed)

## 2018-12-19 ENCOUNTER — Other Ambulatory Visit: Payer: Self-pay

## 2018-12-19 ENCOUNTER — Ambulatory Visit
Admission: RE | Admit: 2018-12-19 | Discharge: 2018-12-19 | Disposition: A | Discharge status: Home / Self Care | Payer: Medicaid Other | Source: Ambulatory Visit | Attending: Radiation Oncology | Admitting: Radiation Oncology

## 2018-12-19 DIAGNOSIS — Z170 Estrogen receptor positive status [ER+]: Secondary | ICD-10-CM | POA: Diagnosis not present

## 2018-12-19 DIAGNOSIS — C50311 Malignant neoplasm of lower-inner quadrant of right female breast: Secondary | ICD-10-CM | POA: Diagnosis not present

## 2018-12-19 DIAGNOSIS — Z510 Encounter for antineoplastic radiation therapy: Secondary | ICD-10-CM | POA: Diagnosis not present

## 2018-12-20 ENCOUNTER — Other Ambulatory Visit: Payer: Self-pay

## 2018-12-20 ENCOUNTER — Encounter (HOSPITAL_COMMUNITY): Payer: Self-pay

## 2018-12-20 ENCOUNTER — Ambulatory Visit (HOSPITAL_COMMUNITY)
Admission: EM | Admit: 2018-12-20 | Discharge: 2018-12-20 | Disposition: A | Discharge status: Home / Self Care | Payer: Medicaid Other | Attending: Urgent Care | Admitting: Urgent Care

## 2018-12-20 ENCOUNTER — Ambulatory Visit
Admission: RE | Admit: 2018-12-20 | Discharge: 2018-12-20 | Disposition: A | Discharge status: Home / Self Care | Payer: Medicaid Other | Source: Ambulatory Visit | Attending: Radiation Oncology | Admitting: Radiation Oncology

## 2018-12-20 DIAGNOSIS — Z170 Estrogen receptor positive status [ER+]: Secondary | ICD-10-CM | POA: Diagnosis not present

## 2018-12-20 DIAGNOSIS — B029 Zoster without complications: Secondary | ICD-10-CM | POA: Diagnosis not present

## 2018-12-20 DIAGNOSIS — Z510 Encounter for antineoplastic radiation therapy: Secondary | ICD-10-CM | POA: Diagnosis not present

## 2018-12-20 DIAGNOSIS — C50311 Malignant neoplasm of lower-inner quadrant of right female breast: Secondary | ICD-10-CM | POA: Diagnosis not present

## 2018-12-20 DIAGNOSIS — C50919 Malignant neoplasm of unspecified site of unspecified female breast: Secondary | ICD-10-CM

## 2018-12-20 DIAGNOSIS — L299 Pruritus, unspecified: Secondary | ICD-10-CM

## 2018-12-20 MED ORDER — VALACYCLOVIR HCL 1 G PO TABS: 1000.0000 mg | ORAL_TABLET | Freq: Three times a day (TID) | ORAL | 0 refills | Status: AC

## 2018-12-20 MED ORDER — GABAPENTIN 100 MG PO CAPS: 100.0000 mg | ORAL_CAPSULE | Freq: Three times a day (TID) | ORAL | 0 refills | Status: DC

## 2018-12-20 MED ORDER — HYDROXYZINE HCL 25 MG PO TABS: 12.5000 mg | ORAL_TABLET | Freq: Three times a day (TID) | ORAL | 0 refills | Status: DC | PRN

## 2018-12-20 NOTE — ED Provider Notes (Signed)
MRN: [id number] DOB: January 06, 1966  Subjective:   FRITZIE LINDH is a 53 y.o. female presenting for 2-day history of acute onset worsening rash of her mid to right back.  Rash is painful, has blisters.  Patient is currently undergoing treatment for breast cancer of the right breast, has been undergoing radiation treatment and just finished chemotherapy.  Denies fever, cough, sore throat, chest pain.  No current facility-administered medications for this encounter.   Current Outpatient Medications:  .  Accu-Chek FastClix Lancets MISC, USE TO TEST BLOOD SUGAR UP TO FOUR TIMES DAILY AS DIRECTED, Disp: 204 each, Rfl: 3 .  ACCU-CHEK GUIDE test strip, USE TO CHECK FSBS UP TO 4 TIMES A DAY. DX: E11.65, Disp: 100 strip, Rfl: 5 .  Alpha-Lipoic Acid 200 MG CAPS, Take 200 mg by mouth daily., Disp: , Rfl:  .  Ascorbic Acid (VITAMIN C) 1000 MG tablet, Take 1,000 mg by mouth daily. , Disp: , Rfl:  .  BD INSULIN SYRINGE U/F 31G X 5/16" 0.5 ML MISC, USE TO ADMINISTER INSULIN 3 TIMES DAILY WITH MEALS, Disp: , Rfl:  .  Calcium Carbonate-Vitamin D (CALCIUM 600/VITAMIN D PO), Take 1 tablet by mouth daily., Disp: , Rfl:  .  cetirizine (ZYRTEC) 10 MG tablet, Take 10 mg by mouth daily., Disp: , Rfl:  .  Cholecalciferol (VITAMIN D) 125 MCG (5000 UT) CAPS, Take 10,000 Units by mouth daily., Disp: , Rfl:  .  diclofenac (VOLTAREN) 75 MG EC tablet, Take 75 mg by mouth 2 (two) times daily. , Disp: , Rfl:  .  ferrous sulfate 325 (65 FE) MG tablet, Take 325 mg by mouth daily with breakfast., Disp: , Rfl:  .  insulin NPH Human (HUMULIN N) 100 UNIT/ML injection, Inject 0.08 mLs (8 Units total) into the skin at bedtime., Disp: 10 mL, Rfl: 11 .  Insulin Pen Needle (NOVOFINE) 30G X 8 MM MISC, Use once at night to inject insulin. E.11.9, Disp: 100 each, Rfl: 3 .  insulin regular (HUMULIN R) 100 units/mL injection, Inject 0.1 mLs (10 Units total) into the skin 3 (three) times daily before meals. E.11.9. Hold dose for blood sugar  less than 125, Disp: 10 mL, Rfl: 11 .  letrozole (FEMARA) 2.5 MG tablet, Take 1 tablet (2.5 mg total) by mouth daily., Disp: 90 tablet, Rfl: 3 .  metoprolol succinate (TOPROL XL) 25 MG 24 hr tablet, Take 1 tablet (25 mg total) by mouth daily., Disp: 90 tablet, Rfl: 3 .  Milk Thistle 1000 MG CAPS, Take 1,000 mg by mouth daily. , Disp: , Rfl:  .  Multiple Vitamin (MULTIVITAMIN WITH MINERALS) TABS tablet, Take 1 tablet by mouth daily., Disp: , Rfl:  .  oxyCODONE (OXY IR/ROXICODONE) 5 MG immediate release tablet, Take 1 tablet (5 mg total) by mouth every 6 (six) hours as needed for moderate pain, severe pain or breakthrough pain., Disp: 30 tablet, Rfl: 0 .  palbociclib (IBRANCE) 125 MG tablet, Take 1 tablet (125 mg total) by mouth daily. Take for 21 days on, 7 days off, repeat every 28 days. (Patient not taking: Reported on 11/14/2018), Disp: 21 tablet, Rfl: 3 .  prochlorperazine (COMPAZINE) 10 MG tablet, Take 1 tablet (10 mg total) by mouth every 8 (eight) hours as needed for nausea or vomiting., Disp: 30 tablet, Rfl: 1 .  SUPER B COMPLEX/C PO, Take 1 tablet by mouth daily., Disp: , Rfl:  .  zinc gluconate 50 MG tablet, Take 50 mg by mouth daily., Disp: , Rfl:  Allergies  Allergen Reactions  . Naproxen Shortness Of Breath  . Codeine Hives  . Hydrocodone-Acetaminophen Hives  . Ibuprofen     Irritant to stomach r/t diverticulitis  . Lantus [Insulin Glargine]     Yeast Infections  . Meloxicam Other (See Comments)    Abdominal pain   . Propoxyphene N-Acetaminophen Hives  . Sulfonamide Derivatives Hives  . Tramadol Other (See Comments)    Abdominal Pain    Past Medical History:  Diagnosis Date  . Anxiety state, unspecified   . Arthritis   . Breast cancer of lower-inner quadrant of right female breast ([place])   . Carpal tunnel syndrome   . Complication of anesthesia    woke up during ganglion cyst removal in her 20's, not woken up since  . Diabetes mellitus without complication ([place])     Type II  . Heart murmur   . Hyperlipidemia   . Hyperthyroidism   . Mitral valve prolapse   . Smoker   . Tachycardia   . Thyrotoxicosis without mention of goiter or other cause, without mention of thyrotoxic crisis or storm      Past Surgical History:  Procedure Laterality Date  . ABDOMINAL HYSTERECTOMY    . APPENDECTOMY    . CARPAL TUNNEL RELEASE    . GANGLION CYST EXCISION Right   . KNEE SURGERY Right 2007   ACL repair  . TOTAL MASTECTOMY Right 11/16/2018   Procedure: RIGHT MASTECTOMY;  Surgeon: Coralie Keens, MD;  Location: [facility];  Service: General;  Laterality: Right;  . TUBAL LIGATION      Review of Systems  Constitutional: Negative for chills, fever and malaise/fatigue.  HENT: Negative for sore throat.   Eyes: Negative for discharge.  Respiratory: Negative for cough.   Cardiovascular: Negative for chest pain.  Gastrointestinal: Negative for abdominal pain.  Genitourinary: Negative for dysuria and hematuria.  Musculoskeletal: Negative for myalgias.  Neurological: Negative for dizziness and headaches.    Objective:   Vitals: BP 118/75 (BP Location: Right Arm)   Pulse 85   Temp 98.3 F (36.8 C) (Oral)   Resp 17   SpO2 100%   Physical Exam Constitutional:      General: She is not in acute distress.    Appearance: Normal appearance. She is well-developed. She is not ill-appearing, toxic-appearing or diaphoretic.  HENT:     Head: Normocephalic and atraumatic.     Nose: Nose normal.     Mouth/Throat:     Mouth: Mucous membranes are moist.     Pharynx: Oropharynx is clear.  Eyes:     General: No scleral icterus.    Extraocular Movements: Extraocular movements intact.     Pupils: Pupils are equal, round, and reactive to light.  Cardiovascular:     Rate and Rhythm: Normal rate and regular rhythm.     Pulses: Normal pulses.     Heart sounds: Normal heart sounds. No murmur. No friction rub. No gallop.   Pulmonary:     Effort: Pulmonary effort is normal. No  respiratory distress.     Breath sounds: Normal breath sounds. No stridor. No wheezing, rhonchi or rales.  Skin:    General: Skin is warm and dry.     Findings: No rash.       Neurological:     General: No focal deficit present.     Mental Status: She is alert and oriented to person, place, and time.  Psychiatric:        Mood and Affect: Mood  normal.        Behavior: Behavior normal.        Thought Content: Thought content normal.      Assessment and Plan :   1. Herpes zoster without complication   2. Malignant neoplasm of female breast, unspecified estrogen receptor status, unspecified laterality, unspecified site of breast ([place])   3. Itching     Patient is to start Valtrex 3 times daily for 14 days.  She has significant allergies to NSAIDs, codeine and hydrocodone.  Therefore will use gabapentin and hydroxyzine.  Patient is to keep close follow-up with her oncologist and PCP. Counseled patient on potential for adverse effects with medications prescribed/recommended today, ER and return-to-clinic precautions discussed, patient verbalized understanding.    Jaynee Eagles, [place] 12/20/18 [phone]

## 2018-12-20 NOTE — ED Triage Notes (Signed)
Patient presents to Urgent Care with complaints of rash on her back since two days ago. Patient reports she had radiation yesterday for breast cancer and if the rash is shingles she cannot keep having radiation until shingles is ruled out or it goes away. Pt reports the rash is very painful, is unilateral mid-back on the right side.

## 2018-12-21 ENCOUNTER — Ambulatory Visit
Admission: RE | Admit: 2018-12-21 | Discharge: 2018-12-21 | Disposition: A | Discharge status: Home / Self Care | Payer: Medicaid Other | Source: Ambulatory Visit | Attending: Radiation Oncology | Admitting: Radiation Oncology

## 2018-12-21 ENCOUNTER — Other Ambulatory Visit: Payer: Self-pay

## 2018-12-21 DIAGNOSIS — Z170 Estrogen receptor positive status [ER+]: Secondary | ICD-10-CM | POA: Diagnosis not present

## 2018-12-21 DIAGNOSIS — Z510 Encounter for antineoplastic radiation therapy: Secondary | ICD-10-CM | POA: Diagnosis not present

## 2018-12-21 DIAGNOSIS — C50311 Malignant neoplasm of lower-inner quadrant of right female breast: Secondary | ICD-10-CM | POA: Diagnosis not present

## 2018-12-22 ENCOUNTER — Ambulatory Visit
Admission: RE | Admit: 2018-12-22 | Discharge: 2018-12-22 | Disposition: A | Discharge status: Home / Self Care | Payer: Medicaid Other | Source: Ambulatory Visit | Attending: Radiation Oncology | Admitting: Radiation Oncology

## 2018-12-22 ENCOUNTER — Other Ambulatory Visit: Payer: Self-pay

## 2018-12-22 DIAGNOSIS — C50311 Malignant neoplasm of lower-inner quadrant of right female breast: Secondary | ICD-10-CM | POA: Diagnosis not present

## 2018-12-22 DIAGNOSIS — Z510 Encounter for antineoplastic radiation therapy: Secondary | ICD-10-CM | POA: Diagnosis not present

## 2018-12-22 DIAGNOSIS — Z170 Estrogen receptor positive status [ER+]: Secondary | ICD-10-CM | POA: Diagnosis not present

## 2018-12-25 ENCOUNTER — Other Ambulatory Visit: Payer: Self-pay

## 2018-12-25 ENCOUNTER — Telehealth: Payer: Self-pay

## 2018-12-25 ENCOUNTER — Ambulatory Visit
Admission: RE | Admit: 2018-12-25 | Discharge: 2018-12-25 | Disposition: A | Discharge status: Home / Self Care | Payer: Medicaid Other | Source: Ambulatory Visit | Attending: Radiation Oncology | Admitting: Radiation Oncology

## 2018-12-25 DIAGNOSIS — Z170 Estrogen receptor positive status [ER+]: Secondary | ICD-10-CM | POA: Diagnosis not present

## 2018-12-25 DIAGNOSIS — Z510 Encounter for antineoplastic radiation therapy: Secondary | ICD-10-CM | POA: Diagnosis not present

## 2018-12-25 DIAGNOSIS — C50311 Malignant neoplasm of lower-inner quadrant of right female breast: Secondary | ICD-10-CM | POA: Insufficient documentation

## 2018-12-25 NOTE — Telephone Encounter (Signed)
RN returned call, voicemail left for patient to return call [phone] clearance letter.

## 2018-12-25 NOTE — Telephone Encounter (Signed)
Pt called to request a clearance letter to be seen at new dentist office.   Pt currently has appointment at [facility].  RN explained that we can provide a clearance letter to be seen, however we may need additional information if dental procedures/surgery needs to be performed.  Pt voiced understanding and appreciation.    Letter faxed successfully to [phone].

## 2018-12-26 ENCOUNTER — Other Ambulatory Visit: Payer: Self-pay

## 2018-12-26 ENCOUNTER — Ambulatory Visit
Admission: RE | Admit: 2018-12-26 | Discharge: 2018-12-26 | Disposition: A | Discharge status: Home / Self Care | Payer: Medicaid Other | Source: Ambulatory Visit | Attending: Radiation Oncology | Admitting: Radiation Oncology

## 2018-12-26 DIAGNOSIS — C50311 Malignant neoplasm of lower-inner quadrant of right female breast: Secondary | ICD-10-CM | POA: Diagnosis not present

## 2018-12-26 DIAGNOSIS — Z170 Estrogen receptor positive status [ER+]: Secondary | ICD-10-CM | POA: Diagnosis not present

## 2018-12-26 DIAGNOSIS — Z510 Encounter for antineoplastic radiation therapy: Secondary | ICD-10-CM | POA: Diagnosis not present

## 2018-12-27 ENCOUNTER — Other Ambulatory Visit: Payer: Self-pay

## 2018-12-27 ENCOUNTER — Ambulatory Visit
Admission: RE | Admit: 2018-12-27 | Discharge: 2018-12-27 | Disposition: A | Discharge status: Home / Self Care | Payer: Medicaid Other | Source: Ambulatory Visit | Attending: Radiation Oncology | Admitting: Radiation Oncology

## 2018-12-27 DIAGNOSIS — C50311 Malignant neoplasm of lower-inner quadrant of right female breast: Secondary | ICD-10-CM | POA: Diagnosis not present

## 2018-12-27 DIAGNOSIS — Z510 Encounter for antineoplastic radiation therapy: Secondary | ICD-10-CM | POA: Diagnosis not present

## 2018-12-27 DIAGNOSIS — Z170 Estrogen receptor positive status [ER+]: Secondary | ICD-10-CM | POA: Diagnosis not present

## 2018-12-28 ENCOUNTER — Other Ambulatory Visit: Payer: Self-pay

## 2018-12-28 ENCOUNTER — Ambulatory Visit
Admission: RE | Admit: 2018-12-28 | Discharge: 2018-12-28 | Disposition: A | Discharge status: Home / Self Care | Payer: Medicaid Other | Source: Ambulatory Visit | Attending: Radiation Oncology | Admitting: Radiation Oncology

## 2018-12-28 ENCOUNTER — Other Ambulatory Visit: Payer: Self-pay | Admitting: Radiation Therapy

## 2018-12-28 DIAGNOSIS — Z510 Encounter for antineoplastic radiation therapy: Secondary | ICD-10-CM | POA: Diagnosis not present

## 2018-12-28 DIAGNOSIS — C50311 Malignant neoplasm of lower-inner quadrant of right female breast: Secondary | ICD-10-CM | POA: Diagnosis not present

## 2018-12-28 DIAGNOSIS — Z170 Estrogen receptor positive status [ER+]: Secondary | ICD-10-CM | POA: Diagnosis not present

## 2018-12-29 ENCOUNTER — Ambulatory Visit
Admission: RE | Admit: 2018-12-29 | Discharge: 2018-12-29 | Disposition: A | Discharge status: Home / Self Care | Payer: Medicaid Other | Source: Ambulatory Visit | Attending: Radiation Oncology | Admitting: Radiation Oncology

## 2018-12-29 ENCOUNTER — Other Ambulatory Visit: Payer: Self-pay

## 2018-12-29 DIAGNOSIS — Z510 Encounter for antineoplastic radiation therapy: Secondary | ICD-10-CM | POA: Diagnosis not present

## 2018-12-29 DIAGNOSIS — Z170 Estrogen receptor positive status [ER+]: Secondary | ICD-10-CM | POA: Diagnosis not present

## 2018-12-29 DIAGNOSIS — C50311 Malignant neoplasm of lower-inner quadrant of right female breast: Secondary | ICD-10-CM | POA: Diagnosis not present

## 2019-01-01 ENCOUNTER — Other Ambulatory Visit: Payer: Self-pay

## 2019-01-01 ENCOUNTER — Ambulatory Visit
Admission: RE | Admit: 2019-01-01 | Discharge: 2019-01-01 | Disposition: A | Discharge status: Home / Self Care | Payer: Medicaid Other | Source: Ambulatory Visit | Attending: Radiation Oncology | Admitting: Radiation Oncology

## 2019-01-01 DIAGNOSIS — Z510 Encounter for antineoplastic radiation therapy: Secondary | ICD-10-CM | POA: Diagnosis not present

## 2019-01-01 DIAGNOSIS — Z170 Estrogen receptor positive status [ER+]: Secondary | ICD-10-CM | POA: Diagnosis not present

## 2019-01-01 DIAGNOSIS — C50311 Malignant neoplasm of lower-inner quadrant of right female breast: Secondary | ICD-10-CM | POA: Diagnosis not present

## 2019-01-02 ENCOUNTER — Ambulatory Visit
Admission: RE | Admit: 2019-01-02 | Discharge: 2019-01-02 | Disposition: A | Discharge status: Home / Self Care | Payer: Medicaid Other | Source: Ambulatory Visit | Attending: Radiation Oncology | Admitting: Radiation Oncology

## 2019-01-02 ENCOUNTER — Other Ambulatory Visit: Payer: Self-pay

## 2019-01-02 ENCOUNTER — Telehealth: Payer: Self-pay

## 2019-01-02 DIAGNOSIS — Z170 Estrogen receptor positive status [ER+]: Secondary | ICD-10-CM | POA: Diagnosis not present

## 2019-01-02 DIAGNOSIS — C50311 Malignant neoplasm of lower-inner quadrant of right female breast: Secondary | ICD-10-CM | POA: Diagnosis not present

## 2019-01-02 DIAGNOSIS — Z510 Encounter for antineoplastic radiation therapy: Secondary | ICD-10-CM | POA: Diagnosis not present

## 2019-01-02 MED ORDER — AMOXICILLIN 500 MG PO CAPS: 2000.0000 mg | ORAL_CAPSULE | Freq: Once | ORAL | 0 refills | Status: AC

## 2019-01-02 NOTE — Telephone Encounter (Signed)
RN received request from patient's dental office for clearance to have filling placed.    MD provided oncology clearance.  RN successfully faxed letter to [phone].   Voicemail left for patient to update.  Request for call back once schedule so we can send prophylactic antibiotic per MD recommendation.  Amoxicillin 2 grams PO X 1 tablet.

## 2019-01-02 NOTE — Telephone Encounter (Signed)
Pt returned call, pt with appointment for filling on 11/12.    Rx sent to pharmacy.  Pt verbalized understanding and agreement.

## 2019-01-03 ENCOUNTER — Ambulatory Visit
Admission: RE | Admit: 2019-01-03 | Discharge: 2019-01-03 | Disposition: A | Discharge status: Home / Self Care | Payer: Medicaid Other | Source: Ambulatory Visit | Attending: Radiation Oncology | Admitting: Radiation Oncology

## 2019-01-03 DIAGNOSIS — Z510 Encounter for antineoplastic radiation therapy: Secondary | ICD-10-CM | POA: Diagnosis not present

## 2019-01-03 DIAGNOSIS — C50311 Malignant neoplasm of lower-inner quadrant of right female breast: Secondary | ICD-10-CM | POA: Diagnosis not present

## 2019-01-03 DIAGNOSIS — Z170 Estrogen receptor positive status [ER+]: Secondary | ICD-10-CM | POA: Diagnosis not present

## 2019-01-04 ENCOUNTER — Ambulatory Visit
Admission: RE | Admit: 2019-01-04 | Discharge: 2019-01-04 | Disposition: A | Discharge status: Home / Self Care | Payer: Medicaid Other | Source: Ambulatory Visit | Attending: Radiation Oncology | Admitting: Radiation Oncology

## 2019-01-04 ENCOUNTER — Telehealth: Payer: Self-pay

## 2019-01-04 ENCOUNTER — Other Ambulatory Visit: Payer: Self-pay

## 2019-01-04 DIAGNOSIS — C50311 Malignant neoplasm of lower-inner quadrant of right female breast: Secondary | ICD-10-CM | POA: Diagnosis not present

## 2019-01-04 DIAGNOSIS — Z510 Encounter for antineoplastic radiation therapy: Secondary | ICD-10-CM | POA: Diagnosis not present

## 2019-01-04 DIAGNOSIS — Z170 Estrogen receptor positive status [ER+]: Secondary | ICD-10-CM | POA: Diagnosis not present

## 2019-01-04 MED ORDER — AMOXICILLIN 500 MG PO CAPS: 2000.0000 mg | ORAL_CAPSULE | Freq: Once | ORAL | 0 refills | Status: AC

## 2019-01-04 NOTE — Telephone Encounter (Signed)
Melissa called from [facility] to inform that patient thought her dental appointment was today, 11/12 and took her prophylactic antibiotic.  Appointment is actually not scheduled until 11/24, and patient will need new Rx.    RN sent new RX for antibiotic prior to dental appointment on 11/24.

## 2019-01-05 ENCOUNTER — Ambulatory Visit
Admission: RE | Admit: 2019-01-05 | Discharge: 2019-01-05 | Disposition: A | Discharge status: Home / Self Care | Payer: Medicaid Other | Source: Ambulatory Visit | Attending: Radiation Oncology | Admitting: Radiation Oncology

## 2019-01-05 ENCOUNTER — Other Ambulatory Visit: Payer: Self-pay

## 2019-01-05 DIAGNOSIS — Z170 Estrogen receptor positive status [ER+]: Secondary | ICD-10-CM | POA: Diagnosis not present

## 2019-01-05 DIAGNOSIS — Z510 Encounter for antineoplastic radiation therapy: Secondary | ICD-10-CM | POA: Diagnosis not present

## 2019-01-05 DIAGNOSIS — C50311 Malignant neoplasm of lower-inner quadrant of right female breast: Secondary | ICD-10-CM | POA: Diagnosis not present

## 2019-01-08 ENCOUNTER — Ambulatory Visit
Admission: RE | Admit: 2019-01-08 | Discharge: 2019-01-08 | Disposition: A | Discharge status: Home / Self Care | Payer: Medicaid Other | Source: Ambulatory Visit | Attending: Radiation Oncology | Admitting: Radiation Oncology

## 2019-01-08 ENCOUNTER — Other Ambulatory Visit: Payer: Self-pay

## 2019-01-08 DIAGNOSIS — C50311 Malignant neoplasm of lower-inner quadrant of right female breast: Secondary | ICD-10-CM | POA: Diagnosis not present

## 2019-01-08 DIAGNOSIS — Z170 Estrogen receptor positive status [ER+]: Secondary | ICD-10-CM | POA: Diagnosis not present

## 2019-01-08 DIAGNOSIS — Z510 Encounter for antineoplastic radiation therapy: Secondary | ICD-10-CM | POA: Diagnosis not present

## 2019-01-09 ENCOUNTER — Ambulatory Visit (INDEPENDENT_AMBULATORY_CARE_PROVIDER_SITE_OTHER): Payer: Medicaid Other | Admitting: Internal Medicine

## 2019-01-09 ENCOUNTER — Other Ambulatory Visit: Payer: Self-pay

## 2019-01-09 ENCOUNTER — Ambulatory Visit
Admission: RE | Admit: 2019-01-09 | Discharge: 2019-01-09 | Disposition: A | Discharge status: Home / Self Care | Payer: Medicaid Other | Source: Ambulatory Visit | Attending: Radiation Oncology | Admitting: Radiation Oncology

## 2019-01-09 DIAGNOSIS — Z1211 Encounter for screening for malignant neoplasm of colon: Secondary | ICD-10-CM

## 2019-01-09 DIAGNOSIS — Z510 Encounter for antineoplastic radiation therapy: Secondary | ICD-10-CM | POA: Diagnosis not present

## 2019-01-09 DIAGNOSIS — Z170 Estrogen receptor positive status [ER+]: Secondary | ICD-10-CM | POA: Diagnosis not present

## 2019-01-09 DIAGNOSIS — Z8619 Personal history of other infectious and parasitic diseases: Secondary | ICD-10-CM

## 2019-01-09 DIAGNOSIS — E119 Type 2 diabetes mellitus without complications: Secondary | ICD-10-CM

## 2019-01-09 DIAGNOSIS — C50919 Malignant neoplasm of unspecified site of unspecified female breast: Secondary | ICD-10-CM

## 2019-01-09 DIAGNOSIS — C50311 Malignant neoplasm of lower-inner quadrant of right female breast: Secondary | ICD-10-CM | POA: Diagnosis not present

## 2019-01-09 NOTE — Progress Notes (Signed)
Virtual Visit via Telephone Note Due to current restrictions/limitations of in-office visits due to the COVID-19 pandemic, this scheduled clinical appointment was converted to a telehealth visit  I connected with Emily Wilson on 01/09/19 at 11:27 a.m by telephone and verified that I am speaking with the correct person using two identifiers. I am in my office.  The patient is at home.  Only the patient and myself participated in this encounter.  I discussed the limitations, risks, security and privacy concerns of performing an evaluation and management service by telephone and the availability of in person appointments. I also discussed with the patient that there may be a patient responsible charge related to this service. The patient expressed understanding and agreed to proceed.   History of Present Illness: Patient with history of right sided breast CA ESR positive with bone mets (s/p mastectomy, currently receiving XRT), HL, DM, hyperthyroidism  Breast CA:  Since last visit, she had RT mastectomy and has started XRT.  She states so far her treatment is going well.  Mentally she feels bad because having loss one of her breasts is like losing part of her womanhood.  Seen in UC 12/20/2018 with herpes Zoster to the posterior thorax.  Given Valtrex.  Rash has resolved  DM:  Checking BS 2 hrs after meals TID.  BS range 113-136.  No low BS episodes.  Reports compliance with NPH and and are insulin. Appetite decreased while on Valtrex.  Since then, appetite a little better.  She is over due for eye exam.  She plans to get it done at [facility]  HM: Overdue for colon cancer screening.  We discussed various ways to do this.  She prefers to have the Cologuard test.  Outpatient Encounter Medications as of 01/09/2019  Medication Sig Note  . Accu-Chek FastClix Lancets MISC USE TO TEST BLOOD SUGAR UP TO FOUR TIMES DAILY AS DIRECTED   . ACCU-CHEK GUIDE test strip USE TO CHECK FSBS UP TO 4 TIMES A DAY.  DX: E11.65   . Alpha-Lipoic Acid 200 MG CAPS Take 200 mg by mouth daily.   . Ascorbic Acid (VITAMIN C) 1000 MG tablet Take 1,000 mg by mouth daily.    . BD INSULIN SYRINGE U/F 31G X 5/16" 0.5 ML MISC USE TO ADMINISTER INSULIN 3 TIMES DAILY WITH MEALS   . Calcium Carbonate-Vitamin D (CALCIUM 600/VITAMIN D PO) Take 1 tablet by mouth daily.   . cetirizine (ZYRTEC) 10 MG tablet Take 10 mg by mouth daily.   . Cholecalciferol (VITAMIN D) 125 MCG (5000 UT) CAPS Take 10,000 Units by mouth daily.   . diclofenac (VOLTAREN) 75 MG EC tablet Take 75 mg by mouth 2 (two) times daily.    . ferrous sulfate 325 (65 FE) MG tablet Take 325 mg by mouth daily with breakfast.   . gabapentin (NEURONTIN) 100 MG capsule Take 1 capsule (100 mg total) by mouth 3 (three) times daily.   . hydrOXYzine (ATARAX/VISTARIL) 25 MG tablet Take 0.5-1 tablets (12.5-25 mg total) by mouth every 8 (eight) hours as needed for itching.   . insulin NPH Human (HUMULIN N) 100 UNIT/ML injection Inject 0.08 mLs (8 Units total) into the skin at bedtime.   . Insulin Pen Needle (NOVOFINE) 30G X 8 MM MISC Use once at night to inject insulin. E.11.9   . insulin regular (HUMULIN R) 100 units/mL injection Inject 0.1 mLs (10 Units total) into the skin 3 (three) times daily before meals. E.11.9. Hold dose for blood sugar  less than 125   . letrozole (FEMARA) 2.5 MG tablet Take 1 tablet (2.5 mg total) by mouth daily.   . metoprolol succinate (TOPROL XL) 25 MG 24 hr tablet Take 1 tablet (25 mg total) by mouth daily.   . Milk Thistle 1000 MG CAPS Take 1,000 mg by mouth daily.    . Multiple Vitamin (MULTIVITAMIN WITH MINERALS) TABS tablet Take 1 tablet by mouth daily.   . prochlorperazine (COMPAZINE) 10 MG tablet Take 1 tablet (10 mg total) by mouth every 8 (eight) hours as needed for nausea or vomiting.   . SUPER B COMPLEX/C PO Take 1 tablet by mouth daily.   Marland Kitchen zinc gluconate 50 MG tablet Take 50 mg by mouth daily.   . [EXPIRED] amoxicillin (AMOXIL) 500 MG  capsule Take 4 capsules (2,000 mg total) by mouth once for 1 dose. Take prior to dental appointment on 01/16/2019.   Marland Kitchen oxyCODONE (OXY IR/ROXICODONE) 5 MG immediate release tablet Take 1 tablet (5 mg total) by mouth every 6 (six) hours as needed for moderate pain, severe pain or breakthrough pain.   . palbociclib (IBRANCE) 125 MG tablet Take 1 tablet (125 mg total) by mouth daily. Take for 21 days on, 7 days off, repeat every 28 days. (Patient not taking: Reported on 01/09/2019) 11/08/2018: Has not taken since 10/24/2018, was told to hold until after procedure   No facility-administered encounter medications on file as of 01/09/2019.     Observations/Objective: No direct observation done as this was a telephone encounter. Lab Results  Component Value Date   HGBA1C 8.3 (H) 11/10/2018     Assessment and Plan: 1. Type 2 diabetes mellitus without complication, without long-term current use of insulin ([facility]) Reported blood sugar readings are at goal.  She will continue current dose of Humulin N 8 units at bedtime and Humulin R 10 units 3 times daily with meals -She will call and schedule her eye exam sometime before the end of the year. 2. History of shingles Can give Shingrix when she is at least 6 months out and not on any immunosuppressive medications  3. Metastatic breast cancer ([place]) Status post mastectomy.  Currently receiving XRT  4. Screening for colon cancer Cologuard ordered.   Follow Up Instructions: Follow-up in 3 months.   I discussed the assessment and treatment plan with the patient. The patient was provided an opportunity to ask questions and all were answered. The patient agreed with the plan and demonstrated an understanding of the instructions.   The patient was advised to call back or seek an in-person evaluation if the symptoms worsen or if the condition fails to improve as anticipated.  I provided 15 minutes of non-face-to-face time during this  encounter.   Karle Plumber, MD

## 2019-01-10 ENCOUNTER — Ambulatory Visit
Admission: RE | Admit: 2019-01-10 | Discharge: 2019-01-10 | Disposition: A | Discharge status: Home / Self Care | Payer: Medicaid Other | Source: Ambulatory Visit | Attending: Radiation Oncology | Admitting: Radiation Oncology

## 2019-01-10 ENCOUNTER — Other Ambulatory Visit: Payer: Self-pay

## 2019-01-10 DIAGNOSIS — Z170 Estrogen receptor positive status [ER+]: Secondary | ICD-10-CM | POA: Diagnosis not present

## 2019-01-10 DIAGNOSIS — Z510 Encounter for antineoplastic radiation therapy: Secondary | ICD-10-CM | POA: Diagnosis not present

## 2019-01-10 DIAGNOSIS — C50311 Malignant neoplasm of lower-inner quadrant of right female breast: Secondary | ICD-10-CM | POA: Diagnosis not present

## 2019-01-11 ENCOUNTER — Other Ambulatory Visit: Payer: Self-pay

## 2019-01-11 ENCOUNTER — Ambulatory Visit
Admission: RE | Admit: 2019-01-11 | Discharge: 2019-01-11 | Disposition: A | Discharge status: Home / Self Care | Payer: Medicaid Other | Source: Ambulatory Visit | Attending: Radiation Oncology | Admitting: Radiation Oncology

## 2019-01-11 DIAGNOSIS — C50311 Malignant neoplasm of lower-inner quadrant of right female breast: Secondary | ICD-10-CM | POA: Diagnosis not present

## 2019-01-11 DIAGNOSIS — Z4431 Encounter for fitting and adjustment of external right breast prosthesis: Secondary | ICD-10-CM | POA: Diagnosis not present

## 2019-01-11 DIAGNOSIS — C50111 Malignant neoplasm of central portion of right female breast: Secondary | ICD-10-CM | POA: Diagnosis not present

## 2019-01-11 DIAGNOSIS — Z510 Encounter for antineoplastic radiation therapy: Secondary | ICD-10-CM | POA: Diagnosis not present

## 2019-01-11 DIAGNOSIS — Z170 Estrogen receptor positive status [ER+]: Secondary | ICD-10-CM | POA: Diagnosis not present

## 2019-01-12 ENCOUNTER — Other Ambulatory Visit: Payer: Self-pay

## 2019-01-12 ENCOUNTER — Ambulatory Visit
Admission: RE | Admit: 2019-01-12 | Discharge: 2019-01-12 | Disposition: A | Discharge status: Home / Self Care | Payer: Medicaid Other | Source: Ambulatory Visit | Attending: Radiation Oncology | Admitting: Radiation Oncology

## 2019-01-12 DIAGNOSIS — Z170 Estrogen receptor positive status [ER+]: Secondary | ICD-10-CM | POA: Diagnosis not present

## 2019-01-12 DIAGNOSIS — Z510 Encounter for antineoplastic radiation therapy: Secondary | ICD-10-CM | POA: Diagnosis not present

## 2019-01-12 DIAGNOSIS — C50311 Malignant neoplasm of lower-inner quadrant of right female breast: Secondary | ICD-10-CM | POA: Diagnosis not present

## 2019-01-15 ENCOUNTER — Ambulatory Visit
Admission: RE | Admit: 2019-01-15 | Discharge: 2019-01-15 | Disposition: A | Discharge status: Home / Self Care | Payer: Medicaid Other | Source: Ambulatory Visit | Attending: Radiation Oncology | Admitting: Radiation Oncology

## 2019-01-15 ENCOUNTER — Other Ambulatory Visit: Payer: Self-pay

## 2019-01-15 DIAGNOSIS — Z510 Encounter for antineoplastic radiation therapy: Secondary | ICD-10-CM | POA: Diagnosis not present

## 2019-01-15 DIAGNOSIS — Z170 Estrogen receptor positive status [ER+]: Secondary | ICD-10-CM | POA: Diagnosis not present

## 2019-01-15 DIAGNOSIS — C50311 Malignant neoplasm of lower-inner quadrant of right female breast: Secondary | ICD-10-CM | POA: Diagnosis not present

## 2019-01-16 ENCOUNTER — Ambulatory Visit
Admission: RE | Admit: 2019-01-16 | Discharge: 2019-01-16 | Disposition: A | Discharge status: Home / Self Care | Payer: Medicaid Other | Source: Ambulatory Visit | Attending: Radiation Oncology | Admitting: Radiation Oncology

## 2019-01-16 ENCOUNTER — Other Ambulatory Visit: Payer: Self-pay

## 2019-01-16 DIAGNOSIS — C50311 Malignant neoplasm of lower-inner quadrant of right female breast: Secondary | ICD-10-CM | POA: Diagnosis not present

## 2019-01-16 DIAGNOSIS — Z510 Encounter for antineoplastic radiation therapy: Secondary | ICD-10-CM | POA: Diagnosis not present

## 2019-01-16 DIAGNOSIS — Z170 Estrogen receptor positive status [ER+]: Secondary | ICD-10-CM | POA: Diagnosis not present

## 2019-01-17 ENCOUNTER — Ambulatory Visit
Admission: RE | Admit: 2019-01-17 | Discharge: 2019-01-17 | Disposition: A | Discharge status: Home / Self Care | Payer: Medicaid Other | Source: Ambulatory Visit | Attending: Radiation Oncology | Admitting: Radiation Oncology

## 2019-01-17 ENCOUNTER — Other Ambulatory Visit: Payer: Self-pay

## 2019-01-17 DIAGNOSIS — Z170 Estrogen receptor positive status [ER+]: Secondary | ICD-10-CM | POA: Diagnosis not present

## 2019-01-17 DIAGNOSIS — C50311 Malignant neoplasm of lower-inner quadrant of right female breast: Secondary | ICD-10-CM | POA: Diagnosis not present

## 2019-01-17 DIAGNOSIS — Z510 Encounter for antineoplastic radiation therapy: Secondary | ICD-10-CM | POA: Diagnosis not present

## 2019-01-22 ENCOUNTER — Ambulatory Visit: Payer: Self-pay | Attending: Radiation Oncology | Admitting: Radiation Oncology

## 2019-01-22 ENCOUNTER — Ambulatory Visit: Payer: Medicaid Other | Admitting: Radiation Oncology

## 2019-01-22 ENCOUNTER — Other Ambulatory Visit: Payer: Self-pay

## 2019-01-22 ENCOUNTER — Other Ambulatory Visit: Payer: Self-pay | Admitting: *Deleted

## 2019-01-22 ENCOUNTER — Ambulatory Visit
Admission: RE | Admit: 2019-01-22 | Discharge: 2019-01-22 | Disposition: A | Discharge status: Home / Self Care | Payer: Medicaid Other | Source: Ambulatory Visit | Attending: Radiation Oncology | Admitting: Radiation Oncology

## 2019-01-22 DIAGNOSIS — C50311 Malignant neoplasm of lower-inner quadrant of right female breast: Secondary | ICD-10-CM | POA: Diagnosis not present

## 2019-01-22 DIAGNOSIS — Z510 Encounter for antineoplastic radiation therapy: Secondary | ICD-10-CM | POA: Diagnosis not present

## 2019-01-22 DIAGNOSIS — Z170 Estrogen receptor positive status [ER+]: Secondary | ICD-10-CM | POA: Diagnosis not present

## 2019-01-22 MED ORDER — AMOXICILLIN 500 MG PO CAPS: 2000.0000 mg | ORAL_CAPSULE | Freq: Once | ORAL | 0 refills | Status: AC

## 2019-01-22 NOTE — Progress Notes (Signed)
Pt states she has a dental apt on 01/31/2019 to have a tooth filled.  Per MD amoxicillin antibiotic to be called in to pt pharmacy.  Prescription called in and pt verbalized understanding.

## 2019-01-23 ENCOUNTER — Other Ambulatory Visit: Payer: Self-pay

## 2019-01-23 ENCOUNTER — Ambulatory Visit
Admission: RE | Admit: 2019-01-23 | Discharge: 2019-01-23 | Disposition: A | Discharge status: Home / Self Care | Payer: Medicaid Other | Source: Ambulatory Visit | Attending: Radiation Oncology | Admitting: Radiation Oncology

## 2019-01-23 DIAGNOSIS — Z170 Estrogen receptor positive status [ER+]: Secondary | ICD-10-CM | POA: Diagnosis not present

## 2019-01-23 DIAGNOSIS — C50311 Malignant neoplasm of lower-inner quadrant of right female breast: Secondary | ICD-10-CM | POA: Diagnosis not present

## 2019-01-23 DIAGNOSIS — Z510 Encounter for antineoplastic radiation therapy: Secondary | ICD-10-CM | POA: Diagnosis not present

## 2019-01-24 ENCOUNTER — Other Ambulatory Visit: Payer: Self-pay

## 2019-01-24 ENCOUNTER — Ambulatory Visit
Admission: RE | Admit: 2019-01-24 | Discharge: 2019-01-24 | Disposition: A | Discharge status: Home / Self Care | Payer: Medicaid Other | Source: Ambulatory Visit | Attending: Radiation Oncology | Admitting: Radiation Oncology

## 2019-01-24 DIAGNOSIS — C50311 Malignant neoplasm of lower-inner quadrant of right female breast: Secondary | ICD-10-CM | POA: Diagnosis not present

## 2019-01-24 DIAGNOSIS — Z170 Estrogen receptor positive status [ER+]: Secondary | ICD-10-CM | POA: Diagnosis not present

## 2019-01-24 DIAGNOSIS — Z510 Encounter for antineoplastic radiation therapy: Secondary | ICD-10-CM | POA: Diagnosis not present

## 2019-01-25 ENCOUNTER — Ambulatory Visit
Admission: RE | Admit: 2019-01-25 | Discharge: 2019-01-25 | Disposition: A | Discharge status: Home / Self Care | Payer: Medicaid Other | Source: Ambulatory Visit | Attending: Radiation Oncology | Admitting: Radiation Oncology

## 2019-01-25 ENCOUNTER — Other Ambulatory Visit: Payer: Self-pay

## 2019-01-25 DIAGNOSIS — C50311 Malignant neoplasm of lower-inner quadrant of right female breast: Secondary | ICD-10-CM | POA: Diagnosis not present

## 2019-01-25 DIAGNOSIS — Z170 Estrogen receptor positive status [ER+]: Secondary | ICD-10-CM | POA: Diagnosis not present

## 2019-01-25 DIAGNOSIS — Z510 Encounter for antineoplastic radiation therapy: Secondary | ICD-10-CM | POA: Diagnosis not present

## 2019-01-25 MED FILL — IBRANCE 125 MG TABS: 125 | 28 days supply | Qty: 21 | Fill #3

## 2019-01-26 ENCOUNTER — Ambulatory Visit
Admission: RE | Admit: 2019-01-26 | Discharge: 2019-01-26 | Disposition: A | Discharge status: Home / Self Care | Payer: Medicaid Other | Source: Ambulatory Visit | Attending: Radiation Oncology | Admitting: Radiation Oncology

## 2019-01-26 ENCOUNTER — Other Ambulatory Visit: Payer: Self-pay

## 2019-01-26 DIAGNOSIS — Z170 Estrogen receptor positive status [ER+]: Secondary | ICD-10-CM | POA: Diagnosis not present

## 2019-01-26 DIAGNOSIS — Z510 Encounter for antineoplastic radiation therapy: Secondary | ICD-10-CM | POA: Diagnosis not present

## 2019-01-26 DIAGNOSIS — C50311 Malignant neoplasm of lower-inner quadrant of right female breast: Secondary | ICD-10-CM | POA: Diagnosis not present

## 2019-01-29 ENCOUNTER — Ambulatory Visit
Admission: RE | Admit: 2019-01-29 | Discharge: 2019-01-29 | Disposition: A | Discharge status: Home / Self Care | Payer: Medicaid Other | Source: Ambulatory Visit | Attending: Radiation Oncology | Admitting: Radiation Oncology

## 2019-01-29 ENCOUNTER — Other Ambulatory Visit: Payer: Self-pay

## 2019-01-29 DIAGNOSIS — Z170 Estrogen receptor positive status [ER+]: Secondary | ICD-10-CM

## 2019-01-29 DIAGNOSIS — C50311 Malignant neoplasm of lower-inner quadrant of right female breast: Secondary | ICD-10-CM

## 2019-01-29 DIAGNOSIS — Z510 Encounter for antineoplastic radiation therapy: Secondary | ICD-10-CM | POA: Diagnosis not present

## 2019-01-29 MED ORDER — SONAFINE EX EMUL
1.0000 "application " | Freq: Once | CUTANEOUS | Status: AC
  Administered 2019-01-29: 1 via TOPICAL

## 2019-01-30 ENCOUNTER — Other Ambulatory Visit: Payer: Self-pay

## 2019-01-30 ENCOUNTER — Ambulatory Visit
Admission: RE | Admit: 2019-01-30 | Discharge: 2019-01-30 | Disposition: A | Discharge status: Home / Self Care | Payer: Medicaid Other | Source: Ambulatory Visit | Attending: Radiation Oncology | Admitting: Radiation Oncology

## 2019-01-30 ENCOUNTER — Encounter: Payer: Self-pay | Admitting: Radiation Oncology

## 2019-01-30 ENCOUNTER — Telehealth: Payer: Self-pay | Admitting: Hematology and Oncology

## 2019-01-30 DIAGNOSIS — Z170 Estrogen receptor positive status [ER+]: Secondary | ICD-10-CM | POA: Diagnosis not present

## 2019-01-30 DIAGNOSIS — C50311 Malignant neoplasm of lower-inner quadrant of right female breast: Secondary | ICD-10-CM | POA: Diagnosis not present

## 2019-01-30 DIAGNOSIS — Z510 Encounter for antineoplastic radiation therapy: Secondary | ICD-10-CM | POA: Diagnosis not present

## 2019-01-30 NOTE — Telephone Encounter (Signed)
Scheduled appt per 1/27 sch message - pt is aware of appt date and time   

## 2019-02-01 ENCOUNTER — Telehealth: Payer: Self-pay | Admitting: Internal Medicine

## 2019-02-01 MED ORDER — METOPROLOL SUCCINATE ER 25 MG PO TB24: 25.0000 mg | ORAL_TABLET | Freq: Every day | ORAL | 0 refills | Status: DC

## 2019-02-01 NOTE — Telephone Encounter (Signed)
°*  STAT* If patient is at the pharmacy, call can be transferred to refill team.   1. Which medications need to be refilled? (please list name of each medication and dose if known) metoprolol succinate (TOPROL-XL) 25 MG 24 hr tablet   2. Which pharmacy/location (including street and city if local pharmacy) is medication to be sent to? [id number] - [place], [place] - [id number]   3. Do they need a 30 day or 90 day supply? 90 day 

## 2019-02-01 NOTE — Telephone Encounter (Signed)
Pt's medication was sent to pt's pharmacy as requested. Confirmation received.  °

## 2019-02-05 DIAGNOSIS — Z4431 Encounter for fitting and adjustment of external right breast prosthesis: Secondary | ICD-10-CM | POA: Diagnosis not present

## 2019-02-05 DIAGNOSIS — C50111 Malignant neoplasm of central portion of right female breast: Secondary | ICD-10-CM | POA: Diagnosis not present

## 2019-02-05 NOTE — Progress Notes (Signed)
Patient Care Team: Scot Jun, FNP as PCP - General (Family Medicine)  DIAGNOSIS:    ICD-10-CM   1. Malignant neoplasm of lower-inner quadrant of right breast of female, estrogen receptor positive ([place])  C50.311 CBC with Differential ([place])   Z17.0 [place] ([place] only)    SUMMARY OF ONCOLOGIC HISTORY: Oncology History  Malignant neoplasm of lower-inner quadrant of right breast of female, estrogen receptor positive ([place])  02/19/2015 Mammogram   Right breast irregular mass lower inner quadrant 2.2 x 1.3 x 1.7 cm with architectural distortion and skin/nipple retraction, T2 N0 stage II a clinical stage, additional benign cysts largest 1.2 cm   02/20/2015 Initial Diagnosis   Right breast biopsy 5:00: Invasive ductal carcinoma grade 2, perineural invasion present, ER 90%, PR 70%, HER-2 negative ratio 0.95, Ki-67 15%    03/07/2015 Breast MRI   right breast inferior subareolar mass measuring 2.2 x 2.4 x 2.5 cm with skin and nipple enhancement, extending inferiorly and laterally is intraductal enhancement over a distance of 4 cm, several level I right axillary lymph nodes with cortical thickening   03/13/2015 -  Anti-estrogen oral therapy   Neoadjuvant antiestrogen therapy with tamoxifen 20 mg daily (because the patient did not want to undergo surgery immediately for multiple family reasons) stopped in late 2017; anastrozole started 05/11/18   05/23/2018 Imaging   CT CAP: 2.9 cm spiculated soft tissue density central right breast, no evidence of soft tissue metastatic disease within the chest abdomen pelvis.  Subcentimeter sclerotic bone lesions T10, left pubis, right posterior ilium Bone scan: Foci of uptake left frontal calvarium, T-spine, right ilium   10/04/2018 -  Anti-estrogen oral therapy   Ibrance with anastrozole   11/16/2018 Surgery   Right mastectomy Ninfa Linden): IDC, grade 1, 5.2cm, grossly involving underlying skin, clear margins.   12/07/2018 Cancer  Staging   Staging form: Breast, AJCC 7th Edition - Pathologic stage from 12/07/2018: Stage IV (yT4b, NX, M1) - Signed by Eppie Gibson, MD on 12/07/2018   12/19/2018 - 01/30/2019 Radiation Therapy   Adjuvant radiation     CHIEF COMPLIANT: Follow-up of metastatic breast on Ibrance and anastrozole  INTERVAL HISTORY: Emily Wilson is a 53 y.o. with above-mentioned history of metastatic breast cancer currently on anastrozoleand Ibrance. She completed radiation on 01/30/19. She presents to the clinic todayfor a toxicity check.  She is experienced radiation toxicity including an excoriation of the skin from radiation dermatitis. She has also experienced fatigue.   REVIEW OF SYSTEMS:   Constitutional: Denies fevers, chills or abnormal weight loss Eyes: Denies blurriness of vision Ears, nose, mouth, throat, and face: Denies mucositis or sore throat Respiratory: Denies cough, dyspnea or wheezes Cardiovascular: Denies palpitation, chest discomfort Gastrointestinal: Denies nausea, heartburn or change in bowel habits Skin: Denies abnormal skin rashes Lymphatics: Denies new lymphadenopathy or easy bruising Neurological: Denies numbness, tingling or new weaknesses Behavioral/Psych: Mood is stable, no new changes  Extremities: No lower extremity edema Breast: Radiation dermatitis right breast All other systems were reviewed with the patient and are negative.  I have reviewed the past medical history, past surgical history, social history and family history with the patient and they are unchanged from previous note.  ALLERGIES:  is allergic to naproxen; codeine; hydrocodone-acetaminophen; ibuprofen; lantus [insulin glargine]; meloxicam; propoxyphene n-acetaminophen; sulfonamide derivatives; and tramadol.  MEDICATIONS:  Current Outpatient Medications  Medication Sig Dispense Refill  . Accu-Chek FastClix Lancets MISC USE TO TEST BLOOD SUGAR UP TO FOUR TIMES DAILY AS DIRECTED 204 each  3  .  ACCU-CHEK GUIDE test strip USE TO CHECK FSBS UP TO 4 TIMES A DAY. DX: E11.65 100 strip 5  . Alpha-Lipoic Acid 200 MG CAPS Take 200 mg by mouth daily.    . BD INSULIN SYRINGE U/F 31G X 5/16" 0.5 ML MISC USE TO ADMINISTER INSULIN 3 TIMES DAILY WITH MEALS    . cetirizine (ZYRTEC) 10 MG tablet Take 10 mg by mouth daily.    . diclofenac (VOLTAREN) 75 MG EC tablet Take 75 mg by mouth 2 (two) times daily.     . ferrous sulfate 325 (65 FE) MG tablet Take 325 mg by mouth daily with breakfast.    . gabapentin (NEURONTIN) 100 MG capsule Take 3 capsules (300 mg total) by mouth 3 (three) times daily. 90 capsule 1  . insulin NPH Human (HUMULIN N) 100 UNIT/ML injection Inject 0.08 mLs (8 Units total) into the skin at bedtime. 10 mL 11  . insulin regular (HUMULIN R) 100 units/mL injection Inject 0.1 mLs (10 Units total) into the skin 3 (three) times daily before meals. E.11.9. Hold dose for blood sugar less than 125 10 mL 11  . letrozole (FEMARA) 2.5 MG tablet Take 1 tablet (2.5 mg total) by mouth daily. 90 tablet 3  . metoprolol succinate (TOPROL XL) 25 MG 24 hr tablet Take 1 tablet (25 mg total) by mouth daily. Please keep upcoming appt in February with Dr. Lovena Le before anymore refills. Thank you 90 tablet 0  . Milk Thistle 1000 MG CAPS Take 1,000 mg by mouth daily.     . Multiple Vitamin (MULTIVITAMIN WITH MINERALS) TABS tablet Take 1 tablet by mouth daily.    . palbociclib (IBRANCE) 125 MG tablet Take 1 tablet (125 mg total) by mouth daily. Take for 21 days on, 7 days off, repeat every 28 days. (Patient not taking: Reported on 01/09/2019) 21 tablet 3  . prochlorperazine (COMPAZINE) 10 MG tablet Take 1 tablet (10 mg total) by mouth every 8 (eight) hours as needed for nausea or vomiting. 30 tablet 1  . SUPER B COMPLEX/C PO Take 1 tablet by mouth daily.     No current facility-administered medications for this visit.    PHYSICAL EXAMINATION: ECOG PERFORMANCE STATUS: 1 - Symptomatic but completely  ambulatory  Vitals:   02/06/19 1208  BP: (!) 129/93  Pulse: 95  Resp: 17  Temp: 98.5 F (36.9 C)  SpO2: 97%   Filed Weights   02/06/19 1208  Weight: 163 lb 4.8 oz (74.1 kg)    GENERAL: alert, no distress and comfortable SKIN: skin color, texture, turgor are normal, no rashes or significant lesions EYES: normal, Conjunctiva are pink and non-injected, sclera clear OROPHARYNX: no exudate, no erythema and lips, buccal mucosa, and tongue normal  NECK: supple, thyroid normal size, non-tender, without nodularity LYMPH: no palpable lymphadenopathy in the cervical, axillary or inguinal LUNGS: clear to auscultation and percussion with normal breathing effort HEART: regular rate & rhythm and no murmurs and no lower extremity edema ABDOMEN: abdomen soft, non-tender and normal bowel sounds MUSCULOSKELETAL: no cyanosis of digits and no clubbing  NEURO: alert & oriented x 3 with fluent speech, no focal motor/sensory deficits EXTREMITIES: No lower extremity edema  LABORATORY DATA:  I have reviewed the data as listed CMP Latest Ref Rng & Units 12/01/2018 11/10/2018 10/31/2018  Glucose 70 - 99 mg/dL 201(H) 293(H) 266(H)  BUN 6 - 20 mg/dL '16 20 19  ' Creatinine 0.44 - 1.00 mg/dL 0.84 0.76 0.92  Sodium 135 -  145 mmol/L 142 137 139  Potassium 3.5 - 5.1 mmol/L 4.4 3.9 4.1  Chloride 98 - 111 mmol/L 107 103 106  CO2 22 - 32 mmol/L '25 23 22  ' Calcium 8.9 - 10.3 mg/dL 9.5 9.5 9.1  Total Protein 6.5 - 8.1 g/dL 7.4 - 7.2  Total Bilirubin 0.3 - 1.2 mg/dL 0.3 - 0.3  Alkaline Phos 38 - 126 U/L 111 - 108  AST 15 - 41 U/L 20 - 26  ALT 0 - 44 U/L 30 - 41    Lab Results  Component Value Date   WBC 10.8 (H) 12/01/2018   HGB 13.6 12/01/2018   HCT 40.9 12/01/2018   MCV 96.2 12/01/2018   PLT 444 (H) 12/01/2018   NEUTROABS 6.6 12/01/2018    ASSESSMENT & PLAN:  Malignant neoplasm of lower-inner quadrant of right breast of female, estrogen receptor positive ([place]) Right breast irregular mass lower inner  quadrant retroareolar 02/19/2015: 2.2 x 1.3 x 1.7 cm with architectural distortion and skin/nipple retraction, T2 N0 stage II a clinical stage, additional benign cysts largest 1.2 cm Right breast biopsy 02/20/2015 5:00: Invasive ductal carcinoma grade 2, perineural invasion present, ER 90%, PR 70%, HER-2 negative ratio 0.95, Ki-67 15%   Breast MRI 03/07/2015: Subareolar mass 2.2 x 2.4 x 2.5 cm extending into the retracted nipple, extending inferiorly and laterally over a distance of 4 cm, several level I right axillary lymph nodes with cortical thickening up to 7 mm with a maximum diameter of 15 mm --------------------------------------------------------------------- Treatment summary:Anastrozole with Delton See started April 2020Ibrance added 09/29/2018 Bone scan: 09/20/2018: Progression of bone metastatic disease new metastases disease right lesser trochanter, mid left femoral diaphysis, additional bone mets involving left frontal, right iliac, right SI joint, right sixth rib, 12th rib, T12 transverse process and T10  Ibrancetoxicities:No adverse effects previously She will resume Ibrance February 23, 2019  11/16/2018:Right mastectomy Ninfa Linden) performed because the tumor was fungating: IDC, grade 1, 5.2cm, grossly involving underlying skin, clear margins.  Radiation to chest wall: 12/19/18- 01/30/19  Plan: Repeat scans in 2 month F/U after that     Orders Placed This Encounter  Procedures  . CBC with Differential (Cancer Center Only)    Standing Status:   Future    Standing Expiration Date:   02/06/2020  . CMP ([place] only)    Standing Status:   Future    Standing Expiration Date:   02/06/2020   The patient has a good understanding of the overall plan. she agrees with it. she will call with any problems that may develop before the next visit here.  [name], MD 02/06/2019  Emily Wilson, am acting as scribe for Dr. [name].  I have reviewed the above  document for accuracy and completeness, and I agree with the above.

## 2019-02-06 ENCOUNTER — Other Ambulatory Visit: Payer: Self-pay

## 2019-02-06 ENCOUNTER — Inpatient Hospital Stay: Payer: Medicaid Other | Attending: Hematology and Oncology | Admitting: Hematology and Oncology

## 2019-02-06 DIAGNOSIS — C50311 Malignant neoplasm of lower-inner quadrant of right female breast: Secondary | ICD-10-CM | POA: Insufficient documentation

## 2019-02-06 DIAGNOSIS — Z79811 Long term (current) use of aromatase inhibitors: Secondary | ICD-10-CM | POA: Insufficient documentation

## 2019-02-06 DIAGNOSIS — E119 Type 2 diabetes mellitus without complications: Secondary | ICD-10-CM | POA: Insufficient documentation

## 2019-02-06 DIAGNOSIS — Z791 Long term (current) use of non-steroidal anti-inflammatories (NSAID): Secondary | ICD-10-CM | POA: Diagnosis not present

## 2019-02-06 DIAGNOSIS — Z170 Estrogen receptor positive status [ER+]: Secondary | ICD-10-CM | POA: Diagnosis not present

## 2019-02-06 DIAGNOSIS — R5383 Other fatigue: Secondary | ICD-10-CM | POA: Diagnosis not present

## 2019-02-06 DIAGNOSIS — Z79899 Other long term (current) drug therapy: Secondary | ICD-10-CM | POA: Insufficient documentation

## 2019-02-06 DIAGNOSIS — Z794 Long term (current) use of insulin: Secondary | ICD-10-CM | POA: Insufficient documentation

## 2019-02-06 DIAGNOSIS — Z923 Personal history of irradiation: Secondary | ICD-10-CM | POA: Insufficient documentation

## 2019-02-06 DIAGNOSIS — C7951 Secondary malignant neoplasm of bone: Secondary | ICD-10-CM | POA: Diagnosis not present

## 2019-02-06 LAB — COLOGUARD: Cologuard: NEGATIVE

## 2019-02-06 MED ORDER — GABAPENTIN 100 MG PO CAPS: 300.0000 mg | ORAL_CAPSULE | Freq: Three times a day (TID) | ORAL | 1 refills | Status: AC

## 2019-02-06 NOTE — Assessment & Plan Note (Signed)
Right breast irregular mass lower inner quadrant retroareolar 02/19/2015: 2.2 x 1.3 x 1.7 cm with architectural distortion and skin/nipple retraction, T2 N0 stage II a clinical stage, additional benign cysts largest 1.2 cm Right breast biopsy 02/20/2015 5:00: Invasive ductal carcinoma grade 2, perineural invasion present, ER 90%, PR 70%, HER-2 negative ratio 0.95, Ki-67 15%   Breast MRI 03/07/2015: Subareolar mass 2.2 x 2.4 x 2.5 cm extending into the retracted nipple, extending inferiorly and laterally over a distance of 4 cm, several level I right axillary lymph nodes with cortical thickening up to 7 mm with a maximum diameter of 15 mm --------------------------------------------------------------------- Treatment summary:Anastrozole with Delton See started April 2020Ibrance added 09/29/2018 Bone scan: 09/20/2018: Progression of bone metastatic disease new metastases disease right lesser trochanter, mid left femoral diaphysis, additional bone mets involving left frontal, right iliac, right SI joint, right sixth rib, 12th rib, T12 transverse process and T10  Ibrancetoxicities:No adverse effects  11/16/2018:Right mastectomy Ninfa Linden) performed because the tumor was fungating: IDC, grade 1, 5.2cm, grossly involving underlying skin, clear margins.  Radiation to chest wall: 12/19/18- 01/30/19  Plan: Repeat scans in 1 month F/U after that

## 2019-02-07 NOTE — Progress Notes (Signed)
Normal lab letter mailed.

## 2019-02-13 ENCOUNTER — Telehealth: Payer: Self-pay | Admitting: *Deleted

## 2019-02-13 NOTE — Telephone Encounter (Signed)
Patient states she needs to have a tooth removed. Has to go see a new dentist- evaluation is on 02/22/19. Is supposed to start Ibrance on 02/23/19. She says she knows she cannot start Ibrance until after she has the tooth removed.  She will let us know when the extraction is scheduled.

## 2019-03-01 NOTE — Progress Notes (Signed)
I called the patient today about her upcoming follow-up appointment in radiation oncology.   Given the state of the COVID-19 pandemic, concerning case numbers in our community, and guidance from [facility], I offered a phone assessment with the patient to determine if coming to the clinic was necessary. She accepted.  I let the patient know that I had spoken with Dr. Isidore Moos, and she wanted them to know the importance of washing their hands for at least 20 seconds at a time, especially after going out in public, and before they eat.  Limit going out in public whenever possible. Do not touch your face, unless your hands are clean, such as when bathing. Get plenty of rest, eat well, and stay hydrated.   The patient denies any symptomatic concerns.  Specifically, they report good healing of their skin in the radiation fields.  Skin is intact.    I recommended that she continue skin care by applying oil or lotion with vitamin E to the skin in the radiation fields, BID, for 2 more months.  Continue follow-up with medical oncology - follow-up is scheduled on 04/05/19 with Dr. Lindi Adie .  I explained that yearly mammograms are important for patients with intact breast tissue, and physical exams are important after mastectomy for patients that cannot undergo mammography.  I encouraged her to call if she had further questions or concerns about her healing. Otherwise, she will follow-up PRN in radiation oncology. Patient is pleased with this plan, and we will cancel her upcoming follow-up to reduce the risk of COVID-19 transmission.

## 2019-03-02 ENCOUNTER — Inpatient Hospital Stay
Admission: RE | Admit: 2019-03-02 | Discharge: 2019-03-02 | Disposition: A | Discharge status: Home / Self Care | Payer: Self-pay | Source: Ambulatory Visit | Attending: Radiation Oncology | Admitting: Radiation Oncology

## 2019-03-15 ENCOUNTER — Other Ambulatory Visit: Payer: Self-pay | Admitting: Hematology and Oncology

## 2019-03-15 DIAGNOSIS — C50311 Malignant neoplasm of lower-inner quadrant of right female breast: Secondary | ICD-10-CM

## 2019-03-15 DIAGNOSIS — Z170 Estrogen receptor positive status [ER+]: Secondary | ICD-10-CM

## 2019-03-17 NOTE — Progress Notes (Signed)
  Patient Name: Emily Wilson MRN: [id number] DOB: 1965-11-27 Referring Physician: [name] (Profile Not Attached) Date of Service: 01/30/2019 [facility], [place]                                                        End Of Treatment Note  Diagnoses: C50.311-Malignant neoplasm of lower-inner quadrant of right female breast  Cancer Staging: Cancer Staging Malignant neoplasm of lower-inner quadrant of right breast of female, estrogen receptor positive ([place]) Staging form: Breast, AJCC 7th Edition - Pathologic stage from 12/07/2018: Stage IV (yT4b, NX, M1) - Signed by Eppie Gibson, MD on 12/07/2018   Intent: Palliative / local control  Radiation Treatment Dates: 12/18/2018 through 01/30/2019 Site Technique Total Dose (Gy) Dose per Fx (Gy) Completed Fx Beam Energies  Chest Wall, Right: CW_Rt 3D 50/50 2 25/25 10X, 6X  Chest Wall, Right: CW_Rt_SCV_PAB 3D 50/50 2 25/25 6X, 10X  Chest Wall, Right: CW_Rt_Bst Electron 10/10 2 5/5 6E   Narrative: The patient tolerated radiation therapy relatively well.   Plan: The patient will follow-up with radiation oncology in 1 mo, or as needed.   Eppie Gibson, MD

## 2019-03-21 ENCOUNTER — Telehealth: Payer: Self-pay | Admitting: Hematology and Oncology

## 2019-03-21 NOTE — Telephone Encounter (Signed)
Rescheduled per MD. Hulen Skains and left a msg. Mailing printout

## 2019-03-29 ENCOUNTER — Other Ambulatory Visit: Payer: Self-pay

## 2019-03-29 ENCOUNTER — Encounter: Payer: Self-pay | Admitting: Internal Medicine

## 2019-03-29 ENCOUNTER — Ambulatory Visit: Payer: Medicaid Other | Admitting: Internal Medicine

## 2019-03-29 VITALS — BP 110/74 | HR 111 | Ht 59.0 in | Wt 167.4 lb

## 2019-03-29 DIAGNOSIS — I10 Essential (primary) hypertension: Secondary | ICD-10-CM | POA: Diagnosis not present

## 2019-03-29 DIAGNOSIS — R002 Palpitations: Secondary | ICD-10-CM | POA: Diagnosis not present

## 2019-03-29 MED ORDER — METOPROLOL SUCCINATE ER 25 MG PO TB24: 25.0000 mg | ORAL_TABLET | Freq: Every day | ORAL | 3 refills | Status: DC

## 2019-03-29 MED FILL — IBRANCE 125 MG TABS: 125 | 28 days supply | Qty: 21 | Fill #0

## 2019-03-29 NOTE — Progress Notes (Signed)
HPI Emily Wilson returns today for followup. She has a remote h/o SVT and now HTN and increasing weight gain. She has been diagnosed with metastatic breast CA. She is on chemo therapy. She notes that she has not had any real fast heart racing although she does have sinus tachycardia.  Allergies  Allergen Reactions  . Naproxen Shortness Of Breath  . Codeine Hives  . Hydrocodone-Acetaminophen Hives  . Ibuprofen     Irritant to stomach r/t diverticulitis  . Lantus [Insulin Glargine]     Yeast Infections  . Meloxicam Other (See Comments)    Abdominal pain   . Propoxyphene N-Acetaminophen Hives  . Sulfonamide Derivatives Hives  . Tramadol Other (See Comments)    Abdominal Pain     Current Outpatient Medications  Medication Sig Dispense Refill  . Accu-Chek FastClix Lancets MISC USE TO TEST BLOOD SUGAR UP TO FOUR TIMES DAILY AS DIRECTED 204 each 3  . ACCU-CHEK GUIDE test strip USE TO CHECK FSBS UP TO 4 TIMES A DAY. DX: E11.65 100 strip 5  . Alpha-Lipoic Acid 200 MG CAPS Take 200 mg by mouth daily.    . BD INSULIN SYRINGE U/F 31G X 5/16" 0.5 ML MISC USE TO ADMINISTER INSULIN 3 TIMES DAILY WITH MEALS    . cetirizine (ZYRTEC) 10 MG tablet Take 10 mg by mouth daily.    . diclofenac (VOLTAREN) 75 MG EC tablet Take 75 mg by mouth 2 (two) times daily.     . ferrous sulfate 325 (65 FE) MG tablet Take 325 mg by mouth daily with breakfast.    . gabapentin (NEURONTIN) 100 MG capsule Take 3 capsules (300 mg total) by mouth 3 (three) times daily. 90 capsule 1  . IBRANCE 125 MG tablet TAKE 1 TABLET (125 MG TOTAL) BY MOUTH DAILY. TAKE FOR 21 DAYS ON, 7 DAYS OFF, REPEAT EVERY 28 DAYS. 21 tablet 3  . insulin NPH Human (HUMULIN N) 100 UNIT/ML injection Inject 0.08 mLs (8 Units total) into the skin at bedtime. 10 mL 11  . insulin regular (HUMULIN R) 100 units/mL injection Inject 0.1 mLs (10 Units total) into the skin 3 (three) times daily before meals. E.11.9. Hold dose for blood sugar less than 125  10 mL 11  . letrozole (FEMARA) 2.5 MG tablet Take 1 tablet (2.5 mg total) by mouth daily. 90 tablet 3  . metoprolol succinate (TOPROL XL) 25 MG 24 hr tablet Take 1 tablet (25 mg total) by mouth daily. 90 tablet 3  . Milk Thistle 1000 MG CAPS Take 1,000 mg by mouth daily.     . Multiple Vitamin (MULTIVITAMIN WITH MINERALS) TABS tablet Take 1 tablet by mouth daily.    . prochlorperazine (COMPAZINE) 10 MG tablet Take 1 tablet (10 mg total) by mouth every 8 (eight) hours as needed for nausea or vomiting. 30 tablet 1  . SUPER B COMPLEX/C PO Take 1 tablet by mouth daily.     No current facility-administered medications for this visit.     Past Medical History:  Diagnosis Date  . Anxiety state, unspecified   . Arthritis   . Breast cancer of lower-inner quadrant of right female breast ([place])   . Carpal tunnel syndrome   . Complication of anesthesia    woke up during ganglion cyst removal in her 20's, not woken up since  . Diabetes mellitus without complication ([place])    Type II  . Heart murmur   . History of radiation therapy 12/18/18- 01/30/19  Right Chest wall 25 fractions X 2Gy each to total 50 Gy, followed by a boost 10 Gy in 5 fractions.   . Hyperlipidemia   . Hyperthyroidism   . Mitral valve prolapse   . Smoker   . Tachycardia   . 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   Past Surgical History:  Procedure Laterality Date  . ABDOMINAL HYSTERECTOMY    . APPENDECTOMY    . CARPAL TUNNEL RELEASE    . GANGLION CYST EXCISION Right   . KNEE SURGERY Right 2007   ACL repair  . TOTAL MASTECTOMY Right 11/16/2018   Procedure: RIGHT MASTECTOMY;  Surgeon: Coralie Keens, MD;  Location: [facility];  Service: General;  Laterality: Right;  . TUBAL LIGATION       Family History  Problem Relation Age of Onset  . Diabetes Mother   . Bladder Cancer Mother  46       smoker  . Other Mother 3       TAH for unspecified reason  . Multiple sclerosis Mother   . Cancer Father 7       lymphatic/tonsil cancer - in remission; former smoker  . COPD Maternal Grandmother        smoker  . Emphysema Maternal Grandmother        smoker  . Stroke Maternal Grandfather   . Dementia Maternal Uncle   . Diabetes Paternal Grandmother   . COPD Maternal Uncle        smoker  . Multiple sclerosis Paternal Aunt   . Breast cancer Paternal Aunt        dx. late 34s - early 71s     Social History   Socioeconomic History  . Marital status: Divorced    Spouse name: Not on file  . Number of children: Not on file  . Years of education: Not on file  . Highest education level: Not on file  Occupational History  . Occupation: Scientist, water quality  Tobacco Use  . Smoking status: Former Smoker    Packs/day: 3.00    Years: 30.00    Pack years: 90.00    Types: Cigarettes    Quit date: 10/24/2011    Years since quitting: 7.4  . Smokeless tobacco: Never Used  Substance and Sexual Activity  . Alcohol use: No  . Drug use: Never    Comment: updated as of 09/06/2018 - pt reports no illegal drug use   . Sexual activity: Not Currently  Other Topics Concern  . Not on file  Social History Narrative   In [place] w/boyfriend   Works [employer], Scientist, water quality   Social Determinants of Radio broadcast assistant Strain:   . Difficulty of Paying Living Expenses: Not on file  Food Insecurity:   . Worried About Charity fundraiser in the Last Year: Not on file  . Ran Out of Food in the Last Year: Not on file  Transportation Needs: No Transportation Needs  . Lack of Transportation (Medical): No  . Lack of Transportation (Non-Medical): No  Physical Activity:   . Days of Exercise per Week: Not on file  . Minutes of Exercise per Session: Not on file  Stress:   . Feeling of Stress : Not on file  Social Connections:   . Frequency of Communication with Friends and Family: Not on file  .  Frequency of Social Gatherings with Friends and Family: Not on file  . Attends Religious Services: Not on file  . Active Member of Clubs or Organizations: Not on file  . Attends Archivist Meetings: Not on file  . Marital Status: Not on file  Intimate Partner Violence: Not At Risk  . Fear of Current or Ex-Partner: No  . Emotionally Abused: No  . Physically Abused: No  . Sexually Abused: No     BP 110/74   Pulse (!) 111   Ht 4\' 11"  (1.499 m)   Wt 167 lb 6.4 oz (75.9 kg)   SpO2 97%   BMI 33.81 kg/m   Physical Exam:  Well appearing NAD HEENT: Unremarkable Neck:  No JVD, no thyromegally Lymphatics:  No adenopathy Back:  No CVA tenderness Lungs:  Clear with no wheezes HEART:  Regular rate rhythm, no murmurs, no rubs, no clicks Abd:  soft, positive bowel sounds, no organomegally, no rebound, no guarding Ext:  2 plus pulses, no edema, no cyanosis, no clubbing Skin:  No rashes no nodules Neuro:  CN II through XII intact, motor grossly intact  EKG - sinus tachycardia   Assess/Plan: 1. SVT - she has not had sustained SVT. She does have palpitations and sinus tachycardia. She will continue her low dose toprol. I asked her to avoid caffeine.  2. HTN - her bp is well controlled.  3. Obesity - she has gained 40 lbs in 20 years. Hopefully she can work on weight loss.  Mikle Bosworth.D.

## 2019-03-29 NOTE — Patient Instructions (Signed)

## 2019-04-03 ENCOUNTER — Inpatient Hospital Stay: Payer: Medicaid Other | Attending: Hematology and Oncology

## 2019-04-03 ENCOUNTER — Other Ambulatory Visit: Payer: Self-pay

## 2019-04-03 DIAGNOSIS — C50311 Malignant neoplasm of lower-inner quadrant of right female breast: Secondary | ICD-10-CM | POA: Insufficient documentation

## 2019-04-03 DIAGNOSIS — Z170 Estrogen receptor positive status [ER+]: Secondary | ICD-10-CM | POA: Insufficient documentation

## 2019-04-03 DIAGNOSIS — C7951 Secondary malignant neoplasm of bone: Secondary | ICD-10-CM | POA: Diagnosis not present

## 2019-04-03 LAB — CMP (CANCER CENTER ONLY)
ALT: 28 U/L (ref 0–44)
AST: 24 U/L (ref 15–41)
Albumin: 3.8 g/dL (ref 3.5–5.0)
Alkaline Phosphatase: 82 U/L (ref 38–126)
Anion gap: 9 (ref 5–15)
BUN: 14 mg/dL (ref 6–20)
CO2: 24 mmol/L (ref 22–32)
Calcium: 8.7 mg/dL — ABNORMAL LOW (ref 8.9–10.3)
Chloride: 105 mmol/L (ref 98–111)
Creatinine: 0.77 mg/dL (ref 0.44–1.00)
GFR, Est AFR Am: >60 mL/min (ref >60)
GFR, Estimated: >60 mL/min (ref >60)
Glucose, Bld: 179 mg/dL — ABNORMAL HIGH (ref 70–99)
Potassium: 4.1 mmol/L (ref 3.5–5.1)
Sodium: 138 mmol/L (ref 135–145)
Total Bilirubin: 0.7 mg/dL (ref 0.3–1.2)
Total Protein: 7.3 g/dL (ref 6.5–8.1)

## 2019-04-03 LAB — CBC WITH DIFFERENTIAL (CANCER CENTER ONLY)
Abs Immature Granulocytes: 0.01 10*3/uL (ref 0.00–0.07)
Basophils Absolute: 0.1 10*3/uL (ref 0.0–0.1)
Basophils Relative: 1 %
Eosinophils Absolute: 0.3 10*3/uL (ref 0.0–0.5)
Eosinophils Relative: 5 %
HCT: 44 % (ref 36.0–46.0)
Hemoglobin: 14.7 g/dL (ref 12.0–15.0)
Immature Granulocytes: 0 %
Lymphocytes Relative: 20 %
Lymphs Abs: 1.1 10*3/uL (ref 0.7–4.0)
MCH: 32.2 pg (ref 26.0–34.0)
MCHC: 33.4 g/dL (ref 30.0–36.0)
MCV: 96.5 fL (ref 80.0–100.0)
Monocytes Absolute: 0.3 10*3/uL (ref 0.1–1.0)
Monocytes Relative: 5 %
Neutro Abs: 4 10*3/uL (ref 1.7–7.7)
Neutrophils Relative %: 69 %
Platelet Count: 301 10*3/uL (ref 150–400)
RBC: 4.56 MIL/uL (ref 3.87–5.11)
RDW: 12.3 % (ref 11.5–15.5)
WBC Count: 5.8 10*3/uL (ref 4.0–10.5)
nRBC: 0 % (ref 0.0–0.2)

## 2019-04-05 ENCOUNTER — Ambulatory Visit: Payer: Medicaid Other | Admitting: Hematology and Oncology

## 2019-04-09 ENCOUNTER — Telehealth: Payer: Self-pay

## 2019-04-09 NOTE — Telephone Encounter (Signed)
Called patient to do their pre-visit COVID screening.  Call went to voicemail. Unable to do prescreening.  

## 2019-04-10 ENCOUNTER — Other Ambulatory Visit: Payer: Self-pay

## 2019-04-10 ENCOUNTER — Ambulatory Visit: Payer: Medicaid Other | Admitting: Internal Medicine

## 2019-04-10 ENCOUNTER — Inpatient Hospital Stay: Payer: Medicaid Other | Admitting: Hematology and Oncology

## 2019-04-10 ENCOUNTER — Encounter: Payer: Self-pay | Admitting: Internal Medicine

## 2019-04-10 ENCOUNTER — Ambulatory Visit (INDEPENDENT_AMBULATORY_CARE_PROVIDER_SITE_OTHER): Payer: Medicaid Other | Admitting: Internal Medicine

## 2019-04-10 VITALS — BP 111/71 | HR 77 | Temp 98.1°F | Resp 17 | Ht 59.0 in | Wt 166.0 lb

## 2019-04-10 DIAGNOSIS — E1169 Type 2 diabetes mellitus with other specified complication: Secondary | ICD-10-CM

## 2019-04-10 DIAGNOSIS — E1159 Type 2 diabetes mellitus with other circulatory complications: Secondary | ICD-10-CM | POA: Diagnosis not present

## 2019-04-10 DIAGNOSIS — E785 Hyperlipidemia, unspecified: Secondary | ICD-10-CM | POA: Diagnosis not present

## 2019-04-10 DIAGNOSIS — Z114 Encounter for screening for human immunodeficiency virus [HIV]: Secondary | ICD-10-CM | POA: Diagnosis not present

## 2019-04-10 DIAGNOSIS — C50311 Malignant neoplasm of lower-inner quadrant of right female breast: Secondary | ICD-10-CM

## 2019-04-10 DIAGNOSIS — I10 Essential (primary) hypertension: Secondary | ICD-10-CM

## 2019-04-10 DIAGNOSIS — Z794 Long term (current) use of insulin: Secondary | ICD-10-CM | POA: Diagnosis not present

## 2019-04-10 LAB — POCT GLYCOSYLATED HEMOGLOBIN (HGB A1C): Hemoglobin A1C: 8.2 % — AB (ref 4.0–5.6)

## 2019-04-10 LAB — GLUCOSE, POCT (MANUAL RESULT ENTRY): POC Glucose: 181 mg/dl — AB (ref 70–99)

## 2019-04-10 MED ORDER — INSULIN NPH (HUMAN) (ISOPHANE) 100 UNIT/ML ~~LOC~~ SUSP: 10.0000 [IU] | Freq: Every day | SUBCUTANEOUS | 11 refills | Status: DC

## 2019-04-10 MED ORDER — INSULIN REGULAR HUMAN 100 UNIT/ML IJ SOLN: 10.0000 [IU] | Freq: Three times a day (TID) | INTRAMUSCULAR | 11 refills | Status: DC

## 2019-04-10 NOTE — Assessment & Plan Note (Deleted)
Right breast irregular mass lower inner quadrant retroareolar 02/19/2015: 2.2 x 1.3 x 1.7 cm with architectural distortion and skin/nipple retraction, T2 N0 stage II a clinical stage, additional benign cysts largest 1.2 cm Right breast biopsy 02/20/2015 5:00: Invasive ductal carcinoma grade 2, perineural invasion present, ER 90%, PR 70%, HER-2 negative ratio 0.95, Ki-67 15%   Breast MRI 03/07/2015: Subareolar mass 2.2 x 2.4 x 2.5 cm extending into the retracted nipple, extending inferiorly and laterally over a distance of 4 cm, several level I right axillary lymph nodes with cortical thickening up to 7 mm with a maximum diameter of 15 mm --------------------------------------------------------------------- Treatment summary:Anastrozole with Delton See started April 2020Ibrance added 09/29/2018 Bone scan: 09/20/2018: Progression of bone metastatic disease new metastases disease right lesser trochanter, mid left femoral diaphysis, additional bone mets involving left frontal, right iliac, right SI joint, right sixth rib, 12th rib, T12 transverse process and T10  Ibrancetoxicities:No adverse effects previously She will resume Ibrance February 23, 2019  11/16/2018:Right mastectomy ([place])performed because the tumor was fungating: IDC, grade 1, 5.2cm, grossly involving underlying skin, clear margins.  Radiation to chest wall: 12/19/18- 01/30/19  Plan: Repeat scans in 2 month F/U after that

## 2019-04-10 NOTE — Progress Notes (Signed)
Subjective:    Emily Wilson - 54 y.o. female MRN [id number]  Date of birth: [birth year]  HPI  Emily Wilson is here for follow up of chronic medical conditions.  Diabetes mellitus, Type 2 Disease Monitoring             Blood Sugar Ranges: Fasting - typically >150              Polyuria: no              Visual problems: no  Urine Microalbumin Obtaining today, previously within range   Last A1C: 8.4 (Sept 2020)   Medications: Humulin NPH 8 u at night, Humulin regular 10u TID with meals  Medication Compliance: yes  Medication Side Effects             Hypoglycemia: no   Preventitive Health Care             Eye Exam: Referral placed              Foot Exam: Needs   Chronic HTN Disease Monitoring:  Home BP Monitoring - Yes, reports BP stays pretty good. Typically 110s/70s.  Chest pain- no  Dyspnea- no Headache - no  Medications: Metoprolol 25 mg  Compliance- yes Lightheadedness- no  Edema- no       Health Maintenance Due  Topic Date Due  . HIV Screening  02/19/1981  . OPHTHALMOLOGY EXAM  10/29/2016  . FOOT EXAM  07/02/2018  . URINE MICROALBUMIN  05/10/2019    -  reports that she quit smoking about 7 years ago. Her smoking use included cigarettes. She has a 90.00 pack-year smoking history. She has never used smokeless tobacco. - Review of Systems: Per HPI. - Past Medical History: Patient Active Problem List   Diagnosis Date Noted  . History of shingles 01/09/2019  . S/P mastectomy, right 11/16/2018  . Bone metastasis ([place]) 05/31/2018  . Family history of breast cancer in female 03/13/2015  . Malignant neoplasm of lower-inner quadrant of right breast of female, estrogen receptor positive ([place]) 02/25/2015  . Diabetes mellitus type 2, uncontrolled, without complications 0000000  . HLD (hyperlipidemia) 02/25/2015  . Palpitations 01/26/2010  . Anxiety state 05/26/2009  . Hyperthyroidism 11/14/2006  . Hypertension 11/14/2006   -  Medications: reviewed and updated   Objective:   Physical Exam BP 111/71   Pulse 77   Temp 98.1 F (36.7 C) (Temporal)   Resp 17   Ht 4\' 11"  (1.499 m)   Wt 166 lb (75.3 kg)   SpO2 95%   BMI 33.53 kg/m  Physical Exam  Constitutional: She is oriented to person, place, and time and well-developed, well-nourished, and in no distress. No distress.  Cardiovascular: Normal rate.  Pulmonary/Chest: Effort normal. No respiratory distress.  Musculoskeletal:        General: Normal range of motion.  Neurological: She is alert and oriented to person, place, and time.  Skin: Skin is warm and dry. She is not diaphoretic.  Psychiatric: Affect and judgment normal.           Assessment & Plan:   1. Type 2 diabetes mellitus with other circulatory complication, with long-term current use of insulin (HCC) A1c still not at goal with result 8.2%. Fasting CBGs significantly elevated; will increase long acting insulin to 10u nightly.  Counseled on Diabetic diet, my plate method, [id number] minutes of moderate intensity exercise/week Blood sugar logs with fasting goals of 80-120 mg/dl, random of less than 180 and in the  event of sugars less than 60 mg/dl or greater than 400 mg/dl encouraged to notify the clinic. Advised on the need for annual eye exams, annual foot exams, Pneumonia vaccine. - Microalbumin/Creatinine Ratio, Urine - HgB A1c - Glucose (CBG) - insulin regular (HUMULIN R) 100 units/mL injection; Inject 0.1 mLs (10 Units total) into the skin 3 (three) times daily before meals. E.11.9. Hold dose for blood sugar less than 125  Dispense: 10 mL; Refill: 11 - Ambulatory referral to Ophthalmology - insulin NPH Human (HUMULIN N) 100 UNIT/ML injection; Inject 0.1 mLs (10 Units total) into the skin at bedtime.  Dispense: 10 mL; Refill: 11  2. Hyperlipidemia associated with type 2 diabetes mellitus ([place]) - Lipid Panel  3. Encounter for screening for HIV - HIV antibody (with reflex)  4. Essential  hypertension At goal. Continue Metoprolol.    Phill Myron, D.O. 04/10/2019, 10:05 AM Primary Care at [facility]

## 2019-04-11 LAB — LIPID PANEL
Chol/HDL Ratio: 3 ratio (ref 0.0–4.4)
Cholesterol, Total: 125 mg/dL (ref 100–199)
HDL: 42 mg/dL (ref >39)
LDL Chol Calc (NIH): 71 mg/dL (ref 0–99)
Triglycerides: 52 mg/dL (ref 0–149)
VLDL Cholesterol Cal: 12 mg/dL (ref 5–40)

## 2019-04-11 LAB — MICROALBUMIN / CREATININE URINE RATIO
Creatinine, Urine: 99.5 mg/dL
Microalb/Creat Ratio: 3 mg/g creat (ref 0–29)
Microalbumin, Urine: 3.2 ug/mL

## 2019-04-11 LAB — HIV ANTIBODY (ROUTINE TESTING W REFLEX): HIV Screen 4th Generation wRfx: NONREACTIVE

## 2019-04-12 NOTE — Progress Notes (Signed)
Patient notified of results & recommendations. Expressed understanding. Made follow up appointment on 07/11/2019 @ 9:30 AM.

## 2019-04-18 ENCOUNTER — Other Ambulatory Visit: Payer: Self-pay | Admitting: Family Medicine

## 2019-04-19 ENCOUNTER — Other Ambulatory Visit: Payer: Self-pay

## 2019-04-19 ENCOUNTER — Ambulatory Visit (HOSPITAL_COMMUNITY)
Admission: RE | Admit: 2019-04-19 | Discharge: 2019-04-19 | Disposition: A | Discharge status: Home / Self Care | Payer: Medicaid Other | Source: Ambulatory Visit | Attending: Hematology and Oncology | Admitting: Hematology and Oncology

## 2019-04-19 DIAGNOSIS — Z853 Personal history of malignant neoplasm of breast: Secondary | ICD-10-CM | POA: Diagnosis not present

## 2019-04-19 DIAGNOSIS — K5730 Diverticulosis of large intestine without perforation or abscess without bleeding: Secondary | ICD-10-CM | POA: Diagnosis not present

## 2019-04-19 DIAGNOSIS — C50311 Malignant neoplasm of lower-inner quadrant of right female breast: Secondary | ICD-10-CM | POA: Diagnosis not present

## 2019-04-19 DIAGNOSIS — J439 Emphysema, unspecified: Secondary | ICD-10-CM | POA: Diagnosis not present

## 2019-04-19 DIAGNOSIS — Z170 Estrogen receptor positive status [ER+]: Secondary | ICD-10-CM | POA: Diagnosis not present

## 2019-04-19 MED ORDER — SODIUM CHLORIDE (PF) 0.9 % IJ SOLN
INTRAMUSCULAR | Status: AC
  Filled 2019-04-19: qty 50

## 2019-04-19 MED ORDER — IOHEXOL 300 MG/ML  SOLN
100.0000 mL | Freq: Once | INTRAMUSCULAR | Status: AC | PRN
  Administered 2019-04-19: 100 mL via INTRAVENOUS

## 2019-04-22 NOTE — Progress Notes (Signed)
Patient Care Team: Emily Bang, DO as PCP - General (Family Medicine)  DIAGNOSIS:    ICD-10-CM   1. Bone metastasis ([place])  C79.51   2. Malignant neoplasm of lower-inner quadrant of right breast of female, estrogen receptor positive ([place])  C50.311    Z17.0     SUMMARY OF ONCOLOGIC HISTORY: Oncology History  Malignant neoplasm of lower-inner quadrant of right breast of female, estrogen receptor positive ([place])  02/19/2015 Mammogram   Right breast irregular mass lower inner quadrant 2.2 x 1.3 x 1.7 cm with architectural distortion and skin/nipple retraction, T2 N0 stage II a clinical stage, additional benign cysts largest 1.2 cm   02/20/2015 Initial Diagnosis   Right breast biopsy 5:00: Invasive ductal carcinoma grade 2, perineural invasion present, ER 90%, PR 70%, HER-2 negative ratio 0.95, Ki-67 15%    03/07/2015 Breast MRI   right breast inferior subareolar mass measuring 2.2 x 2.4 x 2.5 cm with skin and nipple enhancement, extending inferiorly and laterally is intraductal enhancement over a distance of 4 cm, several level I right axillary lymph nodes with cortical thickening   03/13/2015 -  Anti-estrogen oral therapy   Neoadjuvant antiestrogen therapy with tamoxifen 20 mg daily (because the patient did not want to undergo surgery immediately for multiple family reasons) stopped in late 2017; anastrozole started 05/11/18   05/23/2018 Imaging   CT CAP: 2.9 cm spiculated soft tissue density central right breast, no evidence of soft tissue metastatic disease within the chest abdomen pelvis.  Subcentimeter sclerotic bone lesions T10, left pubis, right posterior ilium Bone scan: Foci of uptake left frontal calvarium, T-spine, right ilium   10/04/2018 -  Anti-estrogen oral therapy   Ibrance with anastrozole   11/16/2018 Surgery   Right mastectomy Emily Wilson): IDC, grade 1, 5.2cm, grossly involving underlying skin, clear margins.   12/07/2018 Cancer Staging   Staging form:  Breast, AJCC 7th Edition - Pathologic stage from 12/07/2018: Stage IV (yT4b, NX, M1) - Signed by Emily Gibson, MD on 12/07/2018   12/19/2018 - 01/30/2019 Radiation Therapy   Adjuvant radiation     CHIEF COMPLIANT: Follow-up of metastatic breaston Ibrance and anastrozole  INTERVAL HISTORY: Emily Wilson is a 54 y.o. with above-mentioned history of metastatic breast cancer currently on anastrozoleand Ibrance. CT CAP on 04/19/19 showed increase in the size of the sclerotic lesions of the thoracic and lumbar spine and pelvis. She presents to the clinic todayfor a toxicity check.   She continues to have diffuse aches and pains but able to manage okay.  She has had some loose teeth and had a lot of dental work done but all of that has been completed.  ALLERGIES:  is allergic to naproxen; codeine; hydrocodone-acetaminophen; ibuprofen; lantus [insulin glargine]; meloxicam; propoxyphene n-acetaminophen; sulfonamide derivatives; and tramadol.  MEDICATIONS:  Current Outpatient Medications  Medication Sig Dispense Refill  . Accu-Chek FastClix Lancets MISC USE TO TEST BLOOD SUGAR UP TO FOUR TIMES DAILY AS DIRECTED 204 each 3  . ACCU-CHEK GUIDE test strip USE TO CHECK BLOOD SUGAR UP TO 4 TIMES A DAY. DX: E11.65 100 strip 5  . Alpha-Lipoic Acid 200 MG CAPS Take 200 mg by mouth daily.    . BD INSULIN SYRINGE U/F 31G X 5/16" 0.5 ML MISC USE TO ADMINISTER INSULIN 3 TIMES DAILY WITH MEALS    . cetirizine (ZYRTEC) 10 MG tablet Take 10 mg by mouth daily.    . diclofenac (VOLTAREN) 75 MG EC tablet Take 75 mg by mouth 2 (two) times daily.     Marland Kitchen  ferrous sulfate 325 (65 FE) MG tablet Take 325 mg by mouth daily with breakfast.    . gabapentin (NEURONTIN) 100 MG capsule Take 3 capsules (300 mg total) by mouth 3 (three) times daily. 90 capsule 1  . IBRANCE 125 MG tablet TAKE 1 TABLET (125 MG TOTAL) BY MOUTH DAILY. TAKE FOR 21 DAYS ON, 7 DAYS OFF, REPEAT EVERY 28 DAYS. 21 tablet 3  . insulin NPH Human (HUMULIN N)  100 UNIT/ML injection Inject 0.1 mLs (10 Units total) into the skin at bedtime. 10 mL 11  . insulin regular (HUMULIN R) 100 units/mL injection Inject 0.1 mLs (10 Units total) into the skin 3 (three) times daily before meals. E.11.9. Hold dose for blood sugar less than 125 10 mL 11  . letrozole (FEMARA) 2.5 MG tablet Take 1 tablet (2.5 mg total) by mouth daily. 90 tablet 3  . metoprolol succinate (TOPROL XL) 25 MG 24 hr tablet Take 1 tablet (25 mg total) by mouth daily. 90 tablet 3  . Milk Thistle 1000 MG CAPS Take 1,000 mg by mouth daily.     . Multiple Vitamin (MULTIVITAMIN WITH MINERALS) TABS tablet Take 1 tablet by mouth daily.    . prochlorperazine (COMPAZINE) 10 MG tablet Take 1 tablet (10 mg total) by mouth every 8 (eight) hours as needed for nausea or vomiting. 30 tablet 1  . SUPER B COMPLEX/C PO Take 1 tablet by mouth daily.     No current facility-administered medications for this visit.    PHYSICAL EXAMINATION: ECOG PERFORMANCE STATUS: 1 - Symptomatic but completely ambulatory  Vitals:   04/23/19 1124  BP: 137/89  Pulse: (!) 101  Resp: 18  Temp: 98.7 F (37.1 C)  SpO2: 96%   Filed Weights   04/23/19 1124  Weight: 168 lb (76.2 kg)    LABORATORY DATA:  I have reviewed the data as listed CMP Latest Ref Rng & Units 04/03/2019 12/01/2018 11/10/2018  Glucose 70 - 99 mg/dL 179(H) 201(H) 293(H)  BUN 6 - 20 mg/dL '14 16 20  ' Creatinine 0.44 - 1.00 mg/dL 0.77 0.84 0.76  Sodium 135 - 145 mmol/L 138 142 137  Potassium 3.5 - 5.1 mmol/L 4.1 4.4 3.9  Chloride 98 - 111 mmol/L 105 107 103  CO2 22 - 32 mmol/L '24 25 23  ' Calcium 8.9 - 10.3 mg/dL 8.7(L) 9.5 9.5  Total Protein 6.5 - 8.1 g/dL 7.3 7.4 -  Total Bilirubin 0.3 - 1.2 mg/dL 0.7 0.3 -  Alkaline Phos 38 - 126 U/L 82 111 -  AST 15 - 41 U/L 24 20 -  ALT 0 - 44 U/L 28 30 -    Lab Results  Component Value Date   WBC 5.8 04/03/2019   HGB 14.7 04/03/2019   HCT 44.0 04/03/2019   MCV 96.5 04/03/2019   PLT 301 04/03/2019    NEUTROABS 4.0 04/03/2019    ASSESSMENT & PLAN:  Malignant neoplasm of lower-inner quadrant of right breast of female, estrogen receptor positive ([place]) Right breast irregular mass lower inner quadrant retroareolar 02/19/2015: 2.2 x 1.3 x 1.7 cm with architectural distortion and skin/nipple retraction, T2 N0 stage II a clinical stage, additional benign cysts largest 1.2 cm Right breast biopsy 02/20/2015 5:00: Invasive ductal carcinoma grade 2, perineural invasion present, ER 90%, PR 70%, HER-2 negative ratio 0.95, Ki-67 15%   Breast MRI 03/07/2015: Subareolar mass 2.2 x 2.4 x 2.5 cm extending into the retracted nipple, extending inferiorly and laterally over a distance of 4 cm, several level I  right axillary lymph nodes with cortical thickening up to 7 mm with a maximum diameter of 15 mm --------------------------------------------------------------------- Treatment summary:Anastrozole with Delton See started April 2020Ibrance added 09/29/2018 Bone scan: 09/20/2018: Progression of bone metastatic disease new metastases disease right lesser trochanter, mid left femoral diaphysis, additional bone mets involving left frontal, right iliac, right SI joint, right sixth rib, 12th rib, T12 transverse process and T10  Ibrancetoxicities:No adverse effects previously She resumed Ibrance February 23, 2019  11/16/2018:Right mastectomy ([place])performed because the tumor was fungating: IDC, grade 1, 5.2cm, grossly involving underlying skin, clear margins.  Radiation to chest wall: 12/19/18- 01/30/19 CT CAP 04/19/2019: Interval increase in the size of the multiple lucent and sclerotic lesions involving the thoracic and lumbar spine as well as pelvis The bone scans indicate progression of disease.  It could be because she was off Ibrance for a couple of months.  Therefore we decided to continue with the same treatment for 2 months more before we repeat another bone scan.  I would like to send the mastectomy  specimen for [facility] with molecular testing to determine if she has any other treatment options are for her.  If there is continued progression then we can consider adding Faslodex.   No orders of the defined types were placed in this encounter.  The patient has a good understanding of the overall plan. she agrees with it. she will call with any problems that may develop before the next visit here.  Total time spent: 30 mins including face to face time and time spent for planning, charting and coordination of care  [name], MD 04/23/2019  I, Cloyde Reams Dorshimer, am acting as scribe for Dr. [name].  I have reviewed the above documentation for accuracy and completeness, and I agree with the above.

## 2019-04-23 ENCOUNTER — Inpatient Hospital Stay: Payer: Medicaid Other | Attending: Hematology and Oncology | Admitting: Hematology and Oncology

## 2019-04-23 ENCOUNTER — Encounter: Payer: Self-pay | Admitting: *Deleted

## 2019-04-23 ENCOUNTER — Other Ambulatory Visit: Payer: Self-pay

## 2019-04-23 ENCOUNTER — Inpatient Hospital Stay: Payer: Medicaid Other

## 2019-04-23 VITALS — BP 137/89 | HR 101 | Temp 98.7°F | Resp 18 | Ht 59.0 in | Wt 168.0 lb

## 2019-04-23 DIAGNOSIS — Z79811 Long term (current) use of aromatase inhibitors: Secondary | ICD-10-CM | POA: Diagnosis not present

## 2019-04-23 DIAGNOSIS — Z9011 Acquired absence of right breast and nipple: Secondary | ICD-10-CM | POA: Insufficient documentation

## 2019-04-23 DIAGNOSIS — Z794 Long term (current) use of insulin: Secondary | ICD-10-CM | POA: Insufficient documentation

## 2019-04-23 DIAGNOSIS — C50311 Malignant neoplasm of lower-inner quadrant of right female breast: Secondary | ICD-10-CM | POA: Insufficient documentation

## 2019-04-23 DIAGNOSIS — C7951 Secondary malignant neoplasm of bone: Secondary | ICD-10-CM | POA: Diagnosis not present

## 2019-04-23 DIAGNOSIS — Z79899 Other long term (current) drug therapy: Secondary | ICD-10-CM | POA: Insufficient documentation

## 2019-04-23 DIAGNOSIS — Z170 Estrogen receptor positive status [ER+]: Secondary | ICD-10-CM

## 2019-04-23 DIAGNOSIS — Z791 Long term (current) use of non-steroidal anti-inflammatories (NSAID): Secondary | ICD-10-CM | POA: Diagnosis not present

## 2019-04-23 MED ORDER — DENOSUMAB 120 MG/1.7ML ~~LOC~~ SOLN
SUBCUTANEOUS | Status: AC
  Filled 2019-04-23: qty 1.7

## 2019-04-23 MED ORDER — DENOSUMAB 120 MG/1.7ML ~~LOC~~ SOLN
120.0000 mg | Freq: Once | SUBCUTANEOUS | Status: AC
  Administered 2019-04-23: 120 mg via SUBCUTANEOUS

## 2019-04-23 MED FILL — IBRANCE 125 MG TABS: 125 | 28 days supply | Qty: 21 | Fill #1

## 2019-04-23 NOTE — Patient Instructions (Signed)
Denosumab injection What is this medicine? DENOSUMAB (den oh sue mab) slows bone breakdown. Prolia is used to treat osteoporosis in women after menopause and in men, and in people who are taking corticosteroids for 6 months or more. Xgeva is used to treat a high calcium level due to cancer and to prevent bone fractures and other bone problems caused by multiple myeloma or cancer bone metastases. Xgeva is also used to treat giant cell tumor of the bone. This medicine may be used for other purposes; ask your health care provider or pharmacist if you have questions. COMMON BRAND NAME(S): Prolia, XGEVA What should I tell my health care provider before I take this medicine? They need to know if you have any of these conditions:  dental disease  having surgery or tooth extraction  infection  kidney disease  low levels of calcium or Vitamin D in the blood  malnutrition  on hemodialysis  skin conditions or sensitivity  thyroid or parathyroid disease  an unusual reaction to denosumab, other medicines, foods, dyes, or preservatives  pregnant or trying to get pregnant  breast-feeding How should I use this medicine? This medicine is for injection under the skin. It is given by a health care professional in a hospital or clinic setting. A special MedGuide will be given to you before each treatment. Be sure to read this information carefully each time. For Prolia, talk to your pediatrician regarding the use of this medicine in children. Special care may be needed. For Xgeva, talk to your pediatrician regarding the use of this medicine in children. While this drug may be prescribed for children as Emily Wilson as 13 years for selected conditions, precautions do apply. Overdosage: If you think you have taken too much of this medicine contact a poison control center or emergency room at once. NOTE: This medicine is only for you. Do not share this medicine with others. What if I miss a dose? It is  important not to miss your dose. Call your doctor or health care professional if you are unable to keep an appointment. What may interact with this medicine? Do not take this medicine with any of the following medications:  other medicines containing denosumab This medicine may also interact with the following medications:  medicines that lower your chance of fighting infection  steroid medicines like prednisone or cortisone This list may not describe all possible interactions. Give your health care provider a list of all the medicines, herbs, non-prescription drugs, or dietary supplements you use. Also tell them if you smoke, drink alcohol, or use illegal drugs. Some items may interact with your medicine. What should I watch for while using this medicine? Visit your doctor or health care professional for regular checks on your progress. Your doctor or health care professional may order blood tests and other tests to see how you are doing. Call your doctor or health care professional for advice if you get a fever, chills or sore throat, or other symptoms of a cold or flu. Do not treat yourself. This drug may decrease your body's ability to fight infection. Try to avoid being around people who are sick. You should make sure you get enough calcium and vitamin D while you are taking this medicine, unless your doctor tells you not to. Discuss the foods you eat and the vitamins you take with your health care professional. See your dentist regularly. Brush and floss your teeth as directed. Before you have any dental work done, tell your dentist you are   receiving this medicine. Do not become pregnant while taking this medicine or for 5 months after stopping it. Talk with your doctor or health care professional about your birth control options while taking this medicine. Women should inform their doctor if they wish to become pregnant or think they might be pregnant. There is a potential for serious side  effects to an unborn child. Talk to your health care professional or pharmacist for more information. What side effects may I notice from receiving this medicine? Side effects that you should report to your doctor or health care professional as soon as possible:  allergic reactions like skin rash, itching or hives, swelling of the face, lips, or tongue  bone pain  breathing problems  dizziness  jaw pain, especially after dental work  redness, blistering, peeling of the skin  signs and symptoms of infection like fever or chills; cough; sore throat; pain or trouble passing urine  signs of low calcium like fast heartbeat, muscle cramps or muscle pain; pain, tingling, numbness in the hands or feet; seizures  unusual bleeding or bruising  unusually weak or tired Side effects that usually do not require medical attention (report to your doctor or health care professional if they continue or are bothersome):  constipation  diarrhea  headache  joint pain  loss of appetite  muscle pain  runny nose  tiredness  upset stomach This list may not describe all possible side effects. Call your doctor for medical advice about side effects. You may report side effects to FDA at 1-800-FDA-1088. Where should I keep my medicine? This medicine is only given in a clinic, doctor's office, or other health care setting and will not be stored at home. NOTE: This sheet is a summary. It may not cover all possible information. If you have questions about this medicine, talk to your doctor, pharmacist, or health care provider.  2020 Elsevier/Gold Standard (2017-06-17 16:10:44)

## 2019-04-23 NOTE — Progress Notes (Signed)
Per MD request, [employer] tumor profiling requisition successfully faxed.

## 2019-04-23 NOTE — Progress Notes (Signed)
Per Dr. Lindi Adie, [place] to treat with labs from 04/03/2019. Gardiner Rhyme, RN

## 2019-04-23 NOTE — Assessment & Plan Note (Signed)
Right breast irregular mass lower inner quadrant retroareolar 02/19/2015: 2.2 x 1.3 x 1.7 cm with architectural distortion and skin/nipple retraction, T2 N0 stage II a clinical stage, additional benign cysts largest 1.2 cm Right breast biopsy 02/20/2015 5:00: Invasive ductal carcinoma grade 2, perineural invasion present, ER 90%, PR 70%, HER-2 negative ratio 0.95, Ki-67 15%   Breast MRI 03/07/2015: Subareolar mass 2.2 x 2.4 x 2.5 cm extending into the retracted nipple, extending inferiorly and laterally over a distance of 4 cm, several level I right axillary lymph nodes with cortical thickening up to 7 mm with a maximum diameter of 15 mm --------------------------------------------------------------------- Treatment summary:Anastrozole with Delton See started April 2020Ibrance added 09/29/2018 Bone scan: 09/20/2018: Progression of bone metastatic disease new metastases disease right lesser trochanter, mid left femoral diaphysis, additional bone mets involving left frontal, right iliac, right SI joint, right sixth rib, 12th rib, T12 transverse process and T10  Ibrancetoxicities:No adverse effects previously She resumed Ibrance February 23, 2019  11/16/2018:Right mastectomy ([place])performed because the tumor was fungating: IDC, grade 1, 5.2cm, grossly involving underlying skin, clear margins.  Radiation to chest wall: 12/19/18- 01/30/19 CT CAP 04/19/2019: Interval increase in the size of the multiple lucent and sclerotic lesions involving the thoracic and lumbar spine as well as pelvis  Based on these findings I recommended participation in [facility] clinical trial.

## 2019-04-24 ENCOUNTER — Telehealth: Payer: Self-pay | Admitting: Hematology and Oncology

## 2019-04-24 NOTE — Telephone Encounter (Signed)
I talk with patient regarding schedule  

## 2019-05-09 DIAGNOSIS — C7949 Secondary malignant neoplasm of other parts of nervous system: Secondary | ICD-10-CM | POA: Diagnosis not present

## 2019-05-09 DIAGNOSIS — C7951 Secondary malignant neoplasm of bone: Secondary | ICD-10-CM | POA: Diagnosis not present

## 2019-05-09 DIAGNOSIS — C50311 Malignant neoplasm of lower-inner quadrant of right female breast: Secondary | ICD-10-CM | POA: Diagnosis not present

## 2019-05-09 DIAGNOSIS — C792 Secondary malignant neoplasm of skin: Secondary | ICD-10-CM | POA: Diagnosis not present

## 2019-05-09 DIAGNOSIS — Z170 Estrogen receptor positive status [ER+]: Secondary | ICD-10-CM | POA: Diagnosis not present

## 2019-05-11 DIAGNOSIS — C50911 Malignant neoplasm of unspecified site of right female breast: Secondary | ICD-10-CM | POA: Diagnosis not present

## 2019-05-11 DIAGNOSIS — C50919 Malignant neoplasm of unspecified site of unspecified female breast: Secondary | ICD-10-CM | POA: Diagnosis not present

## 2019-05-14 ENCOUNTER — Other Ambulatory Visit: Payer: Self-pay

## 2019-05-14 ENCOUNTER — Encounter: Payer: Self-pay | Admitting: Emergency Medicine

## 2019-05-14 ENCOUNTER — Ambulatory Visit
Admission: EM | Admit: 2019-05-14 | Discharge: 2019-05-14 | Disposition: A | Discharge status: Home / Self Care | Payer: Medicaid Other | Attending: Emergency Medicine | Admitting: Emergency Medicine

## 2019-05-14 DIAGNOSIS — H66004 Acute suppurative otitis media without spontaneous rupture of ear drum, recurrent, right ear: Secondary | ICD-10-CM

## 2019-05-14 MED ORDER — AMOXICILLIN-POT CLAVULANATE 875-125 MG PO TABS: 1.0000 | ORAL_TABLET | Freq: Two times a day (BID) | ORAL | 0 refills | Status: DC

## 2019-05-14 MED ORDER — FLUTICASONE PROPIONATE 50 MCG/ACT NA SUSP: 1.0000 | Freq: Every day | NASAL | 0 refills | Status: DC

## 2019-05-14 NOTE — Discharge Instructions (Addendum)
Take antibiotic as described with food. Follow-up with PCP in 1 week for repeat evaluation if needed. Return sooner for worsening pain, bleeding, decreased hearing, fever.

## 2019-05-14 NOTE — ED Provider Notes (Signed)
[name]    [name]: [id number] Arrival date & time: 05/14/19  1115      History   Chief Complaint Chief Complaint  Patient presents with  . Otalgia    HPI Emily Wilson is a 54 y.o. female story of breast cancer and blood metastasis, type 2 diabetes presenting for 2-week course of right ear pain.  States that she has tried allergy pills, OTC analgesia without relief.  Patient feels that her sinuses have also been inflamed (R>L) and noticed little bit of dried blood when she blew her nose the other day.  Patient denies nasal trauma or prolonged bleeding, fever, chills, cough, shortness of breath, chest pain.  No ear discharge, trauma, travel, prolonged water exposure.  He does admit to history of recurrent otitis media in adulthood: Has seen ENT in the past, though due to issues with insurance has been able to follow-up.   Past Medical History:  Diagnosis Date  . Anxiety state, unspecified   . Arthritis   . Breast cancer of lower-inner quadrant of right female breast ([place])   . Carpal tunnel syndrome   . Complication of anesthesia    woke up during ganglion cyst removal in her 20's, not woken up since  . Diabetes mellitus without complication ([place])    Type II  . Heart murmur   . History of radiation therapy 12/18/18- 01/30/19   Right Chest wall 25 fractions X 2Gy each to total 50 Gy, followed by a boost 10 Gy in 5 fractions.   . Hyperlipidemia   . Hyperthyroidism   . Mitral valve prolapse   . Smoker   . Tachycardia   . 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 History of shingles 01/09/2019  . S/P mastectomy, right 11/16/2018  . Bone metastasis ([place]) 05/31/2018  . Family history of breast cancer in female 03/13/2015  . Malignant neoplasm of lower-inner quadrant of right breast of female, estrogen receptor positive ([place]) 02/25/2015  .  Diabetes mellitus type 2, uncontrolled, without complications 0000000  . HLD (hyperlipidemia) 02/25/2015  . Palpitations 01/26/2010  . Anxiety state 05/26/2009  . Hyperthyroidism 11/14/2006  . Hypertension 11/14/2006    Past Surgical History:  Procedure Laterality Date  . ABDOMINAL HYSTERECTOMY    . APPENDECTOMY    . CARPAL TUNNEL RELEASE    . GANGLION CYST EXCISION Right   . KNEE SURGERY Right 2007   ACL repair  . TOTAL MASTECTOMY Right 11/16/2018   Procedure: RIGHT MASTECTOMY;  Surgeon: Coralie Keens, MD;  Location: [place];  Service: General;  Laterality: Right;  . TUBAL LIGATION      OB History   No obstetric history on file.      Home Medications    Prior to Admission medications   Medication Sig Start Date End Date Taking? Authorizing Provider  Accu-Chek FastClix Lancets MISC USE TO TEST BLOOD SUGAR UP TO FOUR TIMES DAILY AS DIRECTED 05/16/18   Scot Jun, FNP  ACCU-CHEK GUIDE test strip USE TO CHECK BLOOD SUGAR UP TO 4 TIMES A DAY. DX: E11.65 04/18/19   Charlott Rakes, MD  Alpha-Lipoic Acid 200 MG CAPS Take 200 mg by mouth daily.    [provider]  amoxicillin-clavulanate (AUGMENTIN) 875-125 MG tablet Take 1 tablet by mouth every 12 (twelve) hours. 05/14/19   Hall-Potvin, [place], PA-C  BD INSULIN SYRINGE U/F 31G  X 5/16" 0.5 ML MISC USE TO ADMINISTER INSULIN 3 TIMES DAILY WITH MEALS 05/10/18   [provider]  cetirizine (ZYRTEC) 10 MG tablet Take 10 mg by mouth daily.    [provider]  diclofenac (VOLTAREN) 75 MG EC tablet Take 75 mg by mouth 2 (two) times daily.  04/09/18   [provider]  ferrous sulfate 325 (65 FE) MG tablet Take 325 mg by mouth daily with breakfast.    [provider]  fluticasone (FLONASE) 50 MCG/ACT nasal spray Place 1 spray into both nostrils daily. 05/14/19   Hall-Potvin, [place], PA-C  gabapentin (NEURONTIN) 100 MG capsule Take 3 capsules (300 mg total) by mouth 3 (three) times daily.  02/06/19   [name], MD  IBRANCE 125 MG tablet TAKE 1 TABLET (125 MG TOTAL) BY MOUTH DAILY. TAKE FOR 21 DAYS ON, 7 DAYS OFF, REPEAT EVERY 28 DAYS. 03/15/19   [name], MD  insulin NPH Human (HUMULIN N) 100 UNIT/ML injection Inject 0.1 mLs (10 Units total) into the skin at bedtime. 04/10/19   [name], DO  insulin regular (HUMULIN R) 100 units/mL injection Inject 0.1 mLs (10 Units total) into the skin 3 (three) times daily before meals. E.11.9. Hold dose for blood sugar less than 125 04/10/19   [name], DO  letrozole [facility] 2.5 MG tablet Take 1 tablet (2.5 mg total) by mouth daily. 08/31/18   [name], MD  metoprolol succinate (TOPROL XL) 25 MG 24 hr tablet Take 1 tablet (25 mg total) by mouth daily. 03/29/19   [name], MD  Milk Thistle 1000 MG CAPS Take 1,000 mg by mouth daily.     [provider]  Multiple Vitamin (MULTIVITAMIN WITH MINERALS) TABS tablet Take 1 tablet by mouth daily.    [provider]  prochlorperazine (COMPAZINE) 10 MG tablet Take 1 tablet (10 mg total) by mouth every 8 (eight) hours as needed for nausea or vomiting. 10/02/18   [name], MD  SUPER B COMPLEX/C PO Take 1 tablet by mouth daily.    [provider]    Family History Family History  Problem Relation Age of Onset  . Diabetes Mother   . Bladder Cancer Mother 26       smoker  . Other Mother 41       TAH for unspecified reason  . Multiple sclerosis Mother   . Cancer Father 67       lymphatic/tonsil cancer - in remission; former smoker  . COPD Maternal Grandmother        smoker  . Emphysema Maternal Grandmother        smoker  . Stroke Maternal Grandfather   . Dementia Maternal Uncle   . Diabetes Paternal Grandmother   . COPD Maternal Uncle        smoker  . Multiple sclerosis Paternal Aunt   . Breast cancer Paternal Aunt        dx. late 84s - early 72s    Social History Social History   Tobacco Use  . Smoking status:  Former Smoker    Packs/day: 3.00    Years: 30.00    Pack years: 90.00    Types: Cigarettes    Quit date: 10/24/2011    Years since quitting: 7.5  . Smokeless tobacco: Never Used  Substance Use Topics  . Alcohol use: No  . Drug use: Never    Comment: updated as of 09/06/2018 - pt reports no illegal drug use  Allergies   Naproxen, Codeine, Hydrocodone-acetaminophen, Ibuprofen, Lantus [insulin glargine], Meloxicam, Propoxyphene n-acetaminophen, Sulfonamide derivatives, and Tramadol   Review of Systems As per HPI   Physical Exam Triage Vital Signs ED Triage Vitals  Enc Vitals Group     BP      Pulse      Resp      Temp      Temp src      SpO2      Weight      Height      Head Circumference      Peak Flow      Pain Score      Pain Loc      Pain Edu?      Excl. in [place]?    No data found.  Updated Vital Signs BP 129/82 (BP Location: Left Arm)   Pulse (!) 103   Temp (!) 97.3 F (36.3 C) (Temporal)   Resp 18   SpO2 95%   Visual Acuity Right Eye Distance:   Left Eye Distance:   Bilateral Distance:    Right Eye Near:   Left Eye Near:    Bilateral Near:     Physical Exam Constitutional:      General: She is not in acute distress.    Appearance: She is obese. She is not ill-appearing.  HENT:     Head: Normocephalic and atraumatic.     Jaw: There is normal jaw occlusion. No tenderness or pain on movement.     Right Ear: Hearing, ear canal and external ear normal. No tenderness. No mastoid tenderness.     Left Ear: Hearing, tympanic membrane, ear canal and external ear normal. No tenderness. No mastoid tenderness.     Ears:     Comments: Negative tragal tenderness bilaterally.  Right TM without injection, bulging, retraction.  Positive for fluid with suppurativa    Nose: No nasal deformity, septal deviation or nasal tenderness.     Right Turbinates: Not swollen or pale.     Left Turbinates: Not swollen or pale.     Right Sinus: No maxillary sinus tenderness  or frontal sinus tenderness.     Left Sinus: No maxillary sinus tenderness or frontal sinus tenderness.     Comments: Negative sinus tenderness bilaterally.  No abrasion, bleeding visualized    Mouth/Throat:     Lips: Pink. No lesions.     Mouth: Mucous membranes are moist. No injury.     Pharynx: Oropharynx is clear. Uvula midline. No posterior oropharyngeal erythema or uvula swelling.     Comments: no tonsillar exudate or hypertrophy Eyes:     General: No scleral icterus.    Conjunctiva/sclera: Conjunctivae normal.     Pupils: Pupils are equal, round, and reactive to light.  Neck:     Comments: Right submandibular Cardiovascular:     Rate and Rhythm: Regular rhythm. Tachycardia present.  Pulmonary:     Effort: Pulmonary effort is normal. No respiratory distress.     Breath sounds: No wheezing.  Musculoskeletal:     Cervical back: Normal range of motion and neck supple. Tenderness present. No muscular tenderness.  Lymphadenopathy:     Cervical: Cervical adenopathy present.  Neurological:     General: No focal deficit present.     Mental Status: She is alert and oriented to person, place, and time.      UC Treatments / Results  Labs (all labs ordered are listed, but only abnormal results are displayed) Labs Reviewed - No  data to display  EKG   Radiology No results found.  Procedures Procedures (including critical care time)  Medications Ordered in UC Medications - No data to display  Initial Impression / Assessment and Plan / UC Course  I have reviewed the triage vital signs and the nursing notes.  Pertinent labs & imaging results that were available during my care of the patient were reviewed by me and considered in my medical decision making (see chart for details).     Afebrile, nontoxic in office today.  H&P consistent with acute otitis media of right ear.  Patient is immunocompromise secondary to chemotherapy, type 2 diabetes.  Patient does have bone  metastasis secondary to breast cancer: Discussed importance of continual follow-up, particularly with persistent pain after antibiotic use further eval would be needed.  Return precautions discussed, patient verbalized understanding and is agreeable to plan. Final Clinical Impressions(s) / UC Diagnoses   Final diagnoses:  Recurrent acute suppurative otitis media of right ear without spontaneous rupture of tympanic membrane     Discharge Instructions     Take antibiotic as described with food. Follow-up with PCP in 1 week for repeat evaluation if needed. Return sooner for worsening pain, bleeding, decreased hearing, fever.    ED Prescriptions    Medication Sig Dispense Auth. Provider   amoxicillin-clavulanate (AUGMENTIN) 875-125 MG tablet Take 1 tablet by mouth every 12 (twelve) hours. 14 tablet Hall-Potvin, [place], PA-C   fluticasone (FLONASE) 50 MCG/ACT nasal spray Place 1 spray into both nostrils daily. 16 g Hall-Potvin, [place], PA-C     PDMP not reviewed this encounter.   Hall-Potvin, [place], [place] 05/14/19 1143

## 2019-05-14 NOTE — ED Triage Notes (Signed)
Pt presents to [facility] for assessment of right ear pain starting 2 weeks ago.  Patient states she has been taking OTC allergy pills without relief.  Patient c/o a closing sensation to her nostril, as well as dried blood.

## 2019-05-14 NOTE — ED Notes (Signed)
Patient able to ambulate independently  

## 2019-05-17 DIAGNOSIS — H52229 Regular astigmatism, unspecified eye: Secondary | ICD-10-CM | POA: Diagnosis not present

## 2019-05-22 MED FILL — IBRANCE 125 MG TABS: 125 | 28 days supply | Qty: 21 | Fill #2

## 2019-06-18 MED FILL — IBRANCE 125 MG TABS: 125 | 28 days supply | Qty: 21 | Fill #3

## 2019-06-25 ENCOUNTER — Other Ambulatory Visit: Payer: Self-pay

## 2019-06-25 ENCOUNTER — Encounter (HOSPITAL_COMMUNITY)
Admission: RE | Admit: 2019-06-25 | Discharge: 2019-06-25 | Disposition: A | Discharge status: Home / Self Care | Payer: Medicaid Other | Source: Ambulatory Visit | Attending: Hematology and Oncology | Admitting: Hematology and Oncology

## 2019-06-25 ENCOUNTER — Other Ambulatory Visit: Payer: Self-pay | Admitting: Family Medicine

## 2019-06-25 DIAGNOSIS — Z170 Estrogen receptor positive status [ER+]: Secondary | ICD-10-CM | POA: Insufficient documentation

## 2019-06-25 DIAGNOSIS — C50311 Malignant neoplasm of lower-inner quadrant of right female breast: Secondary | ICD-10-CM | POA: Diagnosis not present

## 2019-06-25 DIAGNOSIS — C7951 Secondary malignant neoplasm of bone: Secondary | ICD-10-CM | POA: Insufficient documentation

## 2019-06-25 DIAGNOSIS — C50919 Malignant neoplasm of unspecified site of unspecified female breast: Secondary | ICD-10-CM | POA: Diagnosis not present

## 2019-06-25 DIAGNOSIS — E1165 Type 2 diabetes mellitus with hyperglycemia: Secondary | ICD-10-CM

## 2019-06-25 MED ORDER — TECHNETIUM TC 99M MEDRONATE IV KIT
22.0000 | PACK | Freq: Once | INTRAVENOUS | Status: AC
  Administered 2019-06-25: 22 via INTRAVENOUS

## 2019-06-27 NOTE — Progress Notes (Signed)
Patient Care Team: [name], DO as PCP - General (Family Medicine)  DIAGNOSIS:    ICD-10-CM   1. Malignant neoplasm of lower-inner quadrant of right breast of female, estrogen receptor positive ([place])  C50.311    Z17.0     SUMMARY OF ONCOLOGIC HISTORY: Oncology History  Malignant neoplasm of lower-inner quadrant of right breast of female, estrogen receptor positive ([place])  02/19/2015 Mammogram   Right breast irregular mass lower inner quadrant 2.2 x 1.3 x 1.7 cm with architectural distortion and skin/nipple retraction, T2 N0 stage II a clinical stage, additional benign cysts largest 1.2 cm   02/20/2015 Initial Diagnosis   Right breast biopsy 5:00: Invasive ductal carcinoma grade 2, perineural invasion present, ER 90%, PR 70%, HER-2 negative ratio 0.95, Ki-67 15%    03/07/2015 Breast MRI   right breast inferior subareolar mass measuring 2.2 x 2.4 x 2.5 cm with skin and nipple enhancement, extending inferiorly and laterally is intraductal enhancement over a distance of 4 cm, several level I right axillary lymph nodes with cortical thickening   03/13/2015 -  Anti-estrogen oral therapy   Neoadjuvant antiestrogen therapy with tamoxifen 20 mg daily (because the patient did not want to undergo surgery immediately for multiple family reasons) stopped in late 2017; anastrozole started 05/11/18   05/23/2018 Imaging   CT CAP: 2.9 cm spiculated soft tissue density central right breast, no evidence of soft tissue metastatic disease within the chest abdomen pelvis.  Subcentimeter sclerotic bone lesions T10, left pubis, right posterior ilium Bone scan: Foci of uptake left frontal calvarium, T-spine, right ilium   10/04/2018 -  Anti-estrogen oral therapy   Ibrance with anastrozole   11/16/2018 Surgery   Right mastectomy Ninfa [facility]): IDC, grade 1, 5.2cm, grossly involving underlying skin, clear margins.   12/07/2018 Cancer Staging   Staging form: Breast, AJCC 7th Edition - Pathologic  stage from 12/07/2018: Stage IV (yT4b, NX, M1) - Signed by Eppie Gibson, MD on 12/07/2018   12/19/2018 - 01/30/2019 Radiation Therapy   Adjuvant radiation   05/16/2019 Miscellaneous   [facility] molecular testing: ER positive, PI K3 CA mutation present, HER-2 negative, ER positive TMB low BRCA1 not detected, ESR 1 not detected, PD-L1 negative     CHIEF COMPLIANT: Follow-up of metastatic breaston Ibrance and anastrozole  INTERVAL HISTORY: Emily Wilson is a 54 y.o. with above-mentioned history of metastatic breast cancer currently on anastrozoleand Ibrance. Bone scan on 06/25/19 showed stable osseous metastases, with uptake in several diminished since previous exam. She presents to the clinic todayfora toxicity check and to review her scan.  She is back on Ibrance and is tolerating it very well.  Does have some sinus recent infections for which she took antibiotics.  ALLERGIES:  is allergic to naproxen; codeine; hydrocodone-acetaminophen; ibuprofen; lantus [insulin glargine]; meloxicam; propoxyphene n-acetaminophen; sulfonamide derivatives; and tramadol.  MEDICATIONS:  Current Outpatient Medications  Medication Sig Dispense Refill  . Accu-Chek FastClix Lancets MISC USE TO TEST BLOOD SUGAR UP TO FOUR TIMES DAILY AS DIRECTED 204 each 3  . ACCU-CHEK GUIDE test strip USE TO CHECK BLOOD SUGAR UP TO 4 TIMES A DAY. DX: E11.65 100 strip 5  . Alpha-Lipoic Acid 200 MG CAPS Take 200 mg by mouth daily.    Marland Kitchen amoxicillin-clavulanate (AUGMENTIN) 875-125 MG tablet Take 1 tablet by mouth every 12 (twelve) hours. 14 tablet 0  . BD INSULIN SYRINGE U/F 31G X 5/16" 0.5 ML MISC USE TO ADMINISTER INSULIN 3 TIMES DAILY WITH MEALS    . cetirizine (ZYRTEC)  10 MG tablet Take 10 mg by mouth daily.    . diclofenac (VOLTAREN) 75 MG EC tablet Take 75 mg by mouth 2 (two) times daily.     . ferrous sulfate 325 (65 FE) MG tablet Take 325 mg by mouth daily with breakfast.    . fluticasone (FLONASE) 50 MCG/ACT nasal spray  Place 1 spray into both nostrils daily. 16 g 0  . gabapentin (NEURONTIN) 100 MG capsule Take 3 capsules (300 mg total) by mouth 3 (three) times daily. 90 capsule 1  . IBRANCE 125 MG tablet TAKE 1 TABLET (125 MG TOTAL) BY MOUTH DAILY. TAKE FOR 21 DAYS ON, 7 DAYS OFF, REPEAT EVERY 28 DAYS. 21 tablet 3  . insulin NPH Human (HUMULIN N) 100 UNIT/ML injection Inject 0.1 mLs (10 Units total) into the skin at bedtime. 10 mL 11  . insulin regular (HUMULIN R) 100 units/mL injection Inject 0.1 mLs (10 Units total) into the skin 3 (three) times daily before meals. E.11.9. Hold dose for blood sugar less than 125 10 mL 11  . letrozole (FEMARA) 2.5 MG tablet Take 1 tablet (2.5 mg total) by mouth daily. 90 tablet 3  . metoprolol succinate (TOPROL XL) 25 MG 24 hr tablet Take 1 tablet (25 mg total) by mouth daily. 90 tablet 3  . Milk Thistle 1000 MG CAPS Take 1,000 mg by mouth daily.     . Multiple Vitamin (MULTIVITAMIN WITH MINERALS) TABS tablet Take 1 tablet by mouth daily.    . prochlorperazine (COMPAZINE) 10 MG tablet Take 1 tablet (10 mg total) by mouth every 8 (eight) hours as needed for nausea or vomiting. 30 tablet 1  . SUPER B COMPLEX/C PO Take 1 tablet by mouth daily.     No current facility-administered medications for this visit.    PHYSICAL EXAMINATION: ECOG PERFORMANCE STATUS: 1 - Symptomatic but completely ambulatory  There were no vitals filed for this visit. There were no vitals filed for this visit.  LABORATORY DATA:  I have reviewed the data as listed CMP Latest Ref Rng & Units 04/03/2019 12/01/2018 11/10/2018  Glucose 70 - 99 mg/dL 179(H) 201(H) 293(H)  BUN 6 - 20 mg/dL '14 16 20  ' Creatinine 0.44 - 1.00 mg/dL 0.77 0.84 0.76  Sodium 135 - 145 mmol/L 138 142 137  Potassium 3.5 - 5.1 mmol/L 4.1 4.4 3.9  Chloride 98 - 111 mmol/L 105 107 103  CO2 22 - 32 mmol/L '24 25 23  ' Calcium 8.9 - 10.3 mg/dL 8.7(L) 9.5 9.5  Total Protein 6.5 - 8.1 g/dL 7.3 7.4 -  Total Bilirubin 0.3 - 1.2 mg/dL 0.7 0.3 -   Alkaline Phos 38 - 126 U/L 82 111 -  AST 15 - 41 U/L 24 20 -  ALT 0 - 44 U/L 28 30 -    Lab Results  Component Value Date   WBC 3.9 (L) 06/28/2019   HGB 14.2 06/28/2019   HCT 41.9 06/28/2019   MCV 100.7 (H) 06/28/2019   PLT 389 06/28/2019   NEUTROABS 2.0 06/28/2019    ASSESSMENT & PLAN:  Malignant neoplasm of lower-inner quadrant of right breast of female, estrogen receptor positive ([place]) Right breast irregular mass lower inner quadrant retroareolar 02/19/2015: 2.2 x 1.3 x 1.7 cm with architectural distortion and skin/nipple retraction, T2 N0 stage II a clinical stage, additional benign cysts largest 1.2 cm Right breast biopsy 02/20/2015 5:00: Invasive ductal carcinoma grade 2, perineural invasion present, ER 90%, PR 70%, HER-2 negative ratio 0.95, Ki-67 15%  Breast MRI 03/07/2015: Subareolar mass 2.2 x 2.4 x 2.5 cm extending into the retracted nipple, extending inferiorly and laterally over a distance of 4 cm, several level I right axillary lymph nodes with cortical thickening up to 7 mm with a maximum diameter of 15 mm  05/16/2019:[facility] molecular testing: ER positive, PI K3 CA mutation present, HER-2 negative, ER positive TMB low BRCA1 not detected, ESR 1 not detected, PD-L1 negative --------------------------------------------------------------------- Treatment summary:Anastrozole with Delton See started April 2020Ibrance added 09/29/2018 Bone scan: 09/20/2018: Progression of bone metastatic disease new metastases disease right lesser trochanter, mid left femoral diaphysis, additional bone mets involving left frontal, right iliac, right SI joint, right sixth rib, 12th rib, T12 transverse process and T10  Ibrancetoxicities:No adverse effectspreviously She resumed Ibrance February 23, 2019  11/16/2018:Right mastectomy ([facility])performed because the tumor was fungating: IDC, grade 1, 5.2cm, grossly involving underlying skin, clear margins.  Radiation to chest wall: 12/19/18-  01/30/19 CT CAP 04/19/2019: Interval increase in the size of the multiple lucent and sclerotic lesions involving the thoracic and lumbar spine as well as pelvis. Bone scan 06/26/2019: Stable distribution of bone metastases.  Plan: Continuation of current treatment And recheck CT scans in 3 months. Return to clinic in 1 month with labs and for Xgeva injection.     No orders of the defined types were placed in this encounter.  The patient has a good understanding of the overall plan. she agrees with it. she will call with any problems that may develop before the next visit here.  Total time spent: 30 mins including face to face time and time spent for planning, charting and coordination of care  [name], MD 06/28/2019  I, Cloyde Reams Dorshimer, am acting as scribe for Dr. [name].  I have reviewed the above documentation for accuracy and completeness, and I agree with the above.

## 2019-06-28 ENCOUNTER — Inpatient Hospital Stay: Payer: Medicaid Other | Attending: Hematology and Oncology

## 2019-06-28 ENCOUNTER — Inpatient Hospital Stay (HOSPITAL_BASED_OUTPATIENT_CLINIC_OR_DEPARTMENT_OTHER): Payer: Medicaid Other | Admitting: Hematology and Oncology

## 2019-06-28 ENCOUNTER — Other Ambulatory Visit: Payer: Self-pay

## 2019-06-28 DIAGNOSIS — Z9011 Acquired absence of right breast and nipple: Secondary | ICD-10-CM | POA: Diagnosis not present

## 2019-06-28 DIAGNOSIS — C7951 Secondary malignant neoplasm of bone: Secondary | ICD-10-CM | POA: Diagnosis not present

## 2019-06-28 DIAGNOSIS — C50311 Malignant neoplasm of lower-inner quadrant of right female breast: Secondary | ICD-10-CM | POA: Diagnosis not present

## 2019-06-28 DIAGNOSIS — Z923 Personal history of irradiation: Secondary | ICD-10-CM | POA: Diagnosis not present

## 2019-06-28 DIAGNOSIS — Z170 Estrogen receptor positive status [ER+]: Secondary | ICD-10-CM | POA: Insufficient documentation

## 2019-06-28 DIAGNOSIS — Z79811 Long term (current) use of aromatase inhibitors: Secondary | ICD-10-CM | POA: Diagnosis not present

## 2019-06-28 LAB — CBC WITH DIFFERENTIAL (CANCER CENTER ONLY)
Abs Immature Granulocytes: 0.01 10*3/uL (ref 0.00–0.07)
Basophils Absolute: 0.1 10*3/uL (ref 0.0–0.1)
Basophils Relative: 3 %
Eosinophils Absolute: 0.2 10*3/uL (ref 0.0–0.5)
Eosinophils Relative: 5 %
HCT: 41.9 % (ref 36.0–46.0)
Hemoglobin: 14.2 g/dL (ref 12.0–15.0)
Immature Granulocytes: 0 %
Lymphocytes Relative: 28 %
Lymphs Abs: 1.1 10*3/uL (ref 0.7–4.0)
MCH: 34.1 pg — ABNORMAL HIGH (ref 26.0–34.0)
MCHC: 33.9 g/dL (ref 30.0–36.0)
MCV: 100.7 fL — ABNORMAL HIGH (ref 80.0–100.0)
Monocytes Absolute: 0.5 10*3/uL (ref 0.1–1.0)
Monocytes Relative: 13 %
Neutro Abs: 2 10*3/uL (ref 1.7–7.7)
Neutrophils Relative %: 51 %
Platelet Count: 389 10*3/uL (ref 150–400)
RBC: 4.16 MIL/uL (ref 3.87–5.11)
RDW: 14.5 % (ref 11.5–15.5)
WBC Count: 3.9 10*3/uL — ABNORMAL LOW (ref 4.0–10.5)
nRBC: 0 % (ref 0.0–0.2)

## 2019-06-28 LAB — CMP (CANCER CENTER ONLY)
ALT: 33 U/L (ref 0–44)
AST: 24 U/L (ref 15–41)
Albumin: 3.7 g/dL (ref 3.5–5.0)
Alkaline Phosphatase: 70 U/L (ref 38–126)
Anion gap: 12 (ref 5–15)
BUN: 14 mg/dL (ref 6–20)
CO2: 22 mmol/L (ref 22–32)
Calcium: 8.7 mg/dL — ABNORMAL LOW (ref 8.9–10.3)
Chloride: 106 mmol/L (ref 98–111)
Creatinine: 0.88 mg/dL (ref 0.44–1.00)
GFR, Est AFR Am: >60 mL/min (ref >60)
GFR, Estimated: >60 mL/min (ref >60)
Glucose, Bld: 272 mg/dL — ABNORMAL HIGH (ref 70–99)
Potassium: 4.5 mmol/L (ref 3.5–5.1)
Sodium: 140 mmol/L (ref 135–145)
Total Bilirubin: 0.6 mg/dL (ref 0.3–1.2)
Total Protein: 6.9 g/dL (ref 6.5–8.1)

## 2019-06-28 NOTE — Assessment & Plan Note (Signed)
Right breast irregular mass lower inner quadrant retroareolar 02/19/2015: 2.2 x 1.3 x 1.7 cm with architectural distortion and skin/nipple retraction, T2 N0 stage II a clinical stage, additional benign cysts largest 1.2 cm Right breast biopsy 02/20/2015 5:00: Invasive ductal carcinoma grade 2, perineural invasion present, ER 90%, PR 70%, HER-2 negative ratio 0.95, Ki-67 15%   Breast MRI 03/07/2015: Subareolar mass 2.2 x 2.4 x 2.5 cm extending into the retracted nipple, extending inferiorly and laterally over a distance of 4 cm, several level I right axillary lymph nodes with cortical thickening up to 7 mm with a maximum diameter of 15 mm  05/16/2019:[facility] molecular testing: ER positive, PI K3 CA mutation present, HER-2 negative, ER positive TMB low BRCA1 not detected, ESR 1 not detected, PD-L1 negative --------------------------------------------------------------------- Treatment summary:Anastrozole with Xgeva started April 2020Ibrance added 09/29/2018 Bone scan: 09/20/2018: Progression of bone metastatic disease new metastases disease right lesser trochanter, mid left femoral diaphysis, additional bone mets involving left frontal, right iliac, right SI joint, right sixth rib, 12th rib, T12 transverse process and T10  Ibrancetoxicities:No adverse effectspreviously She resumed Ibrance February 23, 2019  11/16/2018:Right mastectomy ([facility])performed because the tumor was fungating: IDC, grade 1, 5.2cm, grossly involving underlying skin, clear margins.  Radiation to chest wall: 12/19/18- 01/30/19 CT CAP 04/19/2019: Interval increase in the size of the multiple lucent and sclerotic lesions involving the thoracic and lumbar spine as well as pelvis. Bone scan 06/26/2019: Stable distribution of bone metastases.  Plan: Continuation of current treatment And recheck CT scans in 3 months.   

## 2019-06-29 ENCOUNTER — Encounter: Payer: Self-pay | Admitting: Emergency Medicine

## 2019-06-29 ENCOUNTER — Other Ambulatory Visit: Payer: Self-pay

## 2019-06-29 ENCOUNTER — Ambulatory Visit
Admission: EM | Admit: 2019-06-29 | Discharge: 2019-06-29 | Disposition: A | Discharge status: Home / Self Care | Payer: Medicaid Other | Attending: Emergency Medicine | Admitting: Emergency Medicine

## 2019-06-29 DIAGNOSIS — N760 Acute vaginitis: Secondary | ICD-10-CM | POA: Diagnosis not present

## 2019-06-29 DIAGNOSIS — N3001 Acute cystitis with hematuria: Secondary | ICD-10-CM | POA: Diagnosis not present

## 2019-06-29 LAB — POCT URINALYSIS DIP (MANUAL ENTRY)
Bilirubin, UA: NEGATIVE
Blood, UA: NEGATIVE
Glucose, UA: 500 mg/dL — AB
Nitrite, UA: POSITIVE — AB
Protein Ur, POC: 30 mg/dL — AB
Spec Grav, UA: 1.025 (ref 1.010–1.025)
Urobilinogen, UA: 1 E.U./dL
pH, UA: 5 (ref 5.0–8.0)

## 2019-06-29 MED ORDER — FLUCONAZOLE 200 MG PO TABS: 200.0000 mg | ORAL_TABLET | Freq: Once | ORAL | 0 refills | Status: AC

## 2019-06-29 MED ORDER — CEPHALEXIN 500 MG PO CAPS: 500.0000 mg | ORAL_CAPSULE | Freq: Two times a day (BID) | ORAL | 0 refills | Status: AC

## 2019-06-29 NOTE — ED Triage Notes (Addendum)
Pt c/o burning to vaginal area, worse with urination. Pt did not notice any vaginal discharge, but did use monistat last night without relief. Pt also c/o lower mid abdominal pain starting this AM. Pt took AZO today with some relief.

## 2019-06-29 NOTE — Discharge Instructions (Addendum)
Take antibiotic twice daily with food. Important to drink plenty of water throughout the day. May continue Azo as needed for burning sensation. Return for worsening urinary symptoms, blood in urine, abdominal or back pain, fever. 

## 2019-06-29 NOTE — ED Provider Notes (Signed)
[name]    [name]: [id number] Arrival date & time: 06/29/19  1325      History   Chief Complaint Chief Complaint  Patient presents with  . Dysuria    HPI Emily Wilson is a 54 y.o. female with history of metastatic breast cancer (currently on anastrozole and Ibrance), diabetes presenting for vaginal irritation and burning.  States evaluating for the last week or so, though worse last few days.  Has tried Monistat without relief.  Patient took Azo yesterday this morning with some relief of burning.  Patient denies history of frequent UTI, though has frequency at baseline due to diabetes.  Patient denies fever, chills, myalgias, arthralgias.  No blood in urine.  No change in bowel habit.  Patient is currently sexually active.    Past Medical History:  Diagnosis Date  . Anxiety state, unspecified   . Arthritis   . Breast cancer of lower-inner quadrant of right female breast ([place])   . Carpal tunnel syndrome   . Complication of anesthesia    woke up during ganglion cyst removal in her 20's, not woken up since  . Diabetes mellitus without complication ([place])    Type II  . Heart murmur   . History of radiation therapy 12/18/18- 01/30/19   Right Chest wall 25 fractions X 2Gy each to total 50 Gy, followed by a boost 10 Gy in 5 fractions.   . Hyperlipidemia   . Hyperthyroidism   . Mitral valve prolapse   . Smoker   . Tachycardia   . 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 History of shingles 01/09/2019  . S/P mastectomy, right 11/16/2018  . Bone metastasis ([place]) 05/31/2018  . Family history of breast cancer in female 03/13/2015  . Malignant neoplasm of lower-inner quadrant of right breast of female, estrogen receptor positive ([place]) 02/25/2015  . Diabetes mellitus type 2, uncontrolled, without complications 0000000  . HLD (hyperlipidemia)  02/25/2015  . Palpitations 01/26/2010  . Anxiety state 05/26/2009  . Hyperthyroidism 11/14/2006  . Hypertension 11/14/2006    Past Surgical History:  Procedure Laterality Date  . ABDOMINAL HYSTERECTOMY    . APPENDECTOMY    . CARPAL TUNNEL RELEASE    . GANGLION CYST EXCISION Right   . KNEE SURGERY Right 2007   ACL repair  . TOTAL MASTECTOMY Right 11/16/2018   Procedure: RIGHT MASTECTOMY;  Surgeon: Coralie Keens, MD;  Location: [place];  Service: General;  Laterality: Right;  . TUBAL LIGATION      OB History   No obstetric history on file.      Home Medications    Prior to Admission medications   Medication Sig Start Date End Date Taking? Authorizing Provider  Alpha-Lipoic Acid 200 MG CAPS Take 200 mg by mouth daily.   Yes [provider]  diclofenac (VOLTAREN) 75 MG EC tablet Take 75 mg by mouth 2 (two) times daily.  04/09/18  Yes [provider]  ferrous sulfate 325 (65 FE) MG tablet Take 325 mg by mouth daily with breakfast.   Yes [provider]  gabapentin (NEURONTIN) 100 MG capsule Take 3 capsules (300 mg total) by mouth 3 (three) times daily. 02/06/19  Yes [name], MD  IBRANCE 125 MG tablet TAKE 1 TABLET (125 MG TOTAL) BY MOUTH DAILY. TAKE FOR 21 DAYS ON, 7 DAYS OFF, REPEAT EVERY 28 DAYS.  03/15/19  Yes [name], MD  insulin NPH Human (HUMULIN N) 100 UNIT/ML injection Inject 0.1 mLs (10 Units total) into the skin at bedtime. 04/10/19  Yes [name], DO  insulin regular (HUMULIN R) 100 units/mL injection Inject 0.1 mLs (10 Units total) into the skin 3 (three) times daily before meals. E.11.9. Hold dose for blood sugar less than 125 04/10/19  Yes [name], DO  letrozole [facility]) 2.5 MG tablet Take 1 tablet (2.5 mg total) by mouth daily. 08/31/18  Yes [name], MD  metoprolol succinate (TOPROL XL) 25 MG 24 hr tablet Take 1 tablet (25 mg total) by mouth daily. 03/29/19  Yes [name], MD  Multiple  Vitamin (MULTIVITAMIN WITH MINERALS) TABS tablet Take 1 tablet by mouth daily.   Yes [provider]  pseudoephedrine (SUDAFED) 120 MG 12 hr tablet Take 120 mg by mouth 2 (two) times daily.   Yes [provider]  Accu-Chek FastClix Lancets MISC USE TO TEST BLOOD SUGAR UP TO FOUR TIMES DAILY AS DIRECTED 06/25/19   [name], DO  ACCU-CHEK GUIDE test strip USE TO CHECK BLOOD SUGAR UP TO 4 TIMES A DAY. DX: E11.65 04/18/19   Charlott Rakes, MD  BD INSULIN SYRINGE U/F 31G X 5/16" 0.5 ML MISC USE TO ADMINISTER INSULIN 3 TIMES DAILY WITH MEALS 05/10/18   [provider]  cephALEXin (KEFLEX) 500 MG capsule Take 1 capsule (500 mg total) by mouth 2 (two) times daily for 5 days. 06/29/19 07/04/19  Hall-Potvin, [place], PA-C  cetirizine (ZYRTEC) 10 MG tablet Take 10 mg by mouth daily.    [provider]  fluconazole (DIFLUCAN) 200 MG tablet Take 1 tablet (200 mg total) by mouth once for 1 dose. May repeat in 72 hours if needed 06/29/19 06/29/19  Hall-Potvin, [place], PA-C  fluticasone (FLONASE) 50 MCG/ACT nasal spray Place 1 spray into both nostrils daily. 05/14/19   Hall-Potvin, [place], PA-C  Milk Thistle 1000 MG CAPS Take 1,000 mg by mouth daily.     [provider]  prochlorperazine (COMPAZINE) 10 MG tablet Take 1 tablet (10 mg total) by mouth every 8 (eight) hours as needed for nausea or vomiting. 10/02/18   [name], MD  SUPER B COMPLEX/C PO Take 1 tablet by mouth daily.    [provider]    Family History Family History  Problem Relation Age of Onset  . Diabetes Mother   . Bladder Cancer Mother 8       smoker  . Other Mother 56       TAH for unspecified reason  . Multiple sclerosis Mother   . Cancer Father 7       lymphatic/tonsil cancer - in remission; former smoker  . COPD Maternal Grandmother        smoker  . Emphysema Maternal Grandmother        smoker  . Stroke Maternal Grandfather   . Dementia Maternal Uncle   .  Diabetes Paternal Grandmother   . COPD Maternal Uncle        smoker  . Multiple sclerosis Paternal Aunt   . Breast cancer Paternal Aunt        dx. late 8s - early 45s    Social History Social History   Tobacco Use  . Smoking status: Former Smoker    Packs/day: 3.00    Years: 30.00    Pack years: 90.00    Types: Cigarettes    Quit date: 10/24/2011  Years since quitting: 7.6  . Smokeless tobacco: Never Used  Substance Use Topics  . Alcohol use: No  . Drug use: Never    Comment: updated as of 09/06/2018 - pt reports no illegal drug use      Allergies   Naproxen, Codeine, Hydrocodone-acetaminophen, Ibuprofen, Lantus [insulin glargine], Meloxicam, Propoxyphene n-acetaminophen, Sulfonamide derivatives, and Tramadol   Review of Systems As per HPI   Physical Exam Triage Vital Signs ED Triage Vitals  Enc Vitals Group     BP      Pulse      Resp      Temp      Temp src      SpO2      Weight      Height      Head Circumference      Peak Flow      Pain Score      Pain Loc      Pain Edu?      Excl. in [place]?    No data found.  Updated Vital Signs BP 117/67   Pulse 69   Temp 98 F (36.7 C)   Resp 16   SpO2 94%   Visual Acuity Right Eye Distance:   Left Eye Distance:   Bilateral Distance:    Right Eye Near:   Left Eye Near:    Bilateral Near:     Physical Exam Constitutional:      General: She is not in acute distress. HENT:     Head: Normocephalic and atraumatic.  Eyes:     General: No scleral icterus.    Pupils: Pupils are equal, round, and reactive to light.  Cardiovascular:     Rate and Rhythm: Normal rate.  Pulmonary:     Effort: Pulmonary effort is normal.  Abdominal:     General: Bowel sounds are normal.     Palpations: Abdomen is soft.     Tenderness: There is no abdominal tenderness. There is no right CVA tenderness, left CVA tenderness or guarding.  Genitourinary:    Comments: Patient declined, self-swab performed Skin:     Coloration: Skin is not jaundiced or pale.  Neurological:     Mental Status: She is alert and oriented to person, place, and time.      UC Treatments / Results  Labs (all labs ordered are listed, but only abnormal results are displayed) Labs Reviewed  POCT URINALYSIS DIP (MANUAL ENTRY) - Abnormal; Notable for the following components:      Result Value   Color, UA orange (*)    Glucose, UA =500 (*)    Ketones, POC UA trace (5) (*)    Protein Ur, POC =30 (*)    Nitrite, UA Positive (*)    Leukocytes, UA Large (3+) (*)    All other components within normal limits  URINE CULTURE  CERVICOVAGINAL ANCILLARY ONLY    EKG   Radiology No results found.  Procedures Procedures (including critical care time)  Medications Ordered in UC Medications - No data to display  Initial Impression / Assessment and Plan / UC Course  I have reviewed the triage vital signs and the nursing notes.  Pertinent labs & imaging results that were available during my care of the patient were reviewed by me and considered in my medical decision making (see chart for details).     Patient afebrile, nontoxic in office today.  Urine grossly abnormal, though is currently taking Azo.  We will send her for culture,  treat with Keflex in the interim due to comorbidities including diabetes, active chemotherapy for malignancy.  Will start Diflucan for suspected yeast vaginitis.  Cytology pending: We will treat if indicated.  Return precautions discussed, patient verbalized understanding and is agreeable to plan. Final Clinical Impressions(s) / UC Diagnoses   Final diagnoses:  Acute vaginitis  Acute cystitis with hematuria     Discharge Instructions     Take antibiotic twice daily with food. Important to drink plenty of water throughout the day. May continue Azo as needed for burning sensation. Return for worsening urinary symptoms, blood in urine, abdominal or back pain, fever.    ED Prescriptions     Medication Sig Dispense Auth. Provider   cephALEXin (KEFLEX) 500 MG capsule Take 1 capsule (500 mg total) by mouth 2 (two) times daily for 5 days. 10 capsule Hall-Potvin, [place], PA-C   fluconazole (DIFLUCAN) 200 MG tablet Take 1 tablet (200 mg total) by mouth once for 1 dose. May repeat in 72 hours if needed 2 tablet Hall-Potvin, [place], PA-C     PDMP not reviewed this encounter.   Hall-Potvin, [place], [place] 06/29/19 1439

## 2019-06-30 LAB — URINE CULTURE: Culture: NO GROWTH

## 2019-07-02 LAB — CERVICOVAGINAL ANCILLARY ONLY
Chlamydia: NEGATIVE
Comment: NEGATIVE
Comment: NEGATIVE
Comment: NORMAL
Neisseria Gonorrhea: NEGATIVE
Trichomonas: NEGATIVE

## 2019-07-05 ENCOUNTER — Telehealth: Payer: Self-pay | Admitting: Hematology and Oncology

## 2019-07-05 NOTE — Telephone Encounter (Signed)
Scheduled per 05/06 los, patient has been called and voicemail was left.

## 2019-07-11 ENCOUNTER — Telehealth (INDEPENDENT_AMBULATORY_CARE_PROVIDER_SITE_OTHER): Payer: Medicaid Other | Admitting: Internal Medicine

## 2019-07-11 ENCOUNTER — Encounter: Payer: Self-pay | Admitting: Internal Medicine

## 2019-07-11 DIAGNOSIS — E119 Type 2 diabetes mellitus without complications: Secondary | ICD-10-CM

## 2019-07-11 DIAGNOSIS — B373 Candidiasis of vulva and vagina: Secondary | ICD-10-CM

## 2019-07-11 DIAGNOSIS — R0981 Nasal congestion: Secondary | ICD-10-CM

## 2019-07-11 DIAGNOSIS — I10 Essential (primary) hypertension: Secondary | ICD-10-CM | POA: Diagnosis not present

## 2019-07-11 DIAGNOSIS — B3731 Acute candidiasis of vulva and vagina: Secondary | ICD-10-CM

## 2019-07-11 DIAGNOSIS — Z794 Long term (current) use of insulin: Secondary | ICD-10-CM | POA: Diagnosis not present

## 2019-07-11 MED ORDER — PSEUDOEPHEDRINE HCL ER 120 MG PO TB12: 120.0000 mg | ORAL_TABLET | Freq: Two times a day (BID) | ORAL | 1 refills | Status: DC | PRN

## 2019-07-11 MED ORDER — FLUCONAZOLE 150 MG PO TABS: ORAL_TABLET | ORAL | 0 refills | Status: DC

## 2019-07-11 NOTE — Progress Notes (Signed)
Virtual Visit via Telephone Note  I connected with Emily Wilson, on 07/11/2019 at 9:28 AM by telephone due to the COVID-19 pandemic and verified that I am speaking with the correct person using two identifiers.   Consent: I discussed the limitations, risks, security and privacy concerns of performing an evaluation and management service by telephone and the availability of in person appointments. I also discussed with the patient that there may be a patient responsible charge related to this service. The patient expressed understanding and agreed to proceed.   Location of Patient: Home   Location of Provider: Clinic    Persons participating in Telemedicine visit: Emily Wilson Dr. Juleen China   History of Present Illness: Diabetes mellitus, Type 2 Disease Monitoring             Blood Sugar Ranges: Fasting - 160-170 on average. As low as 86.              Polyuria: no              Visual problems: no   Urine Microalbumin 3 (Feb 2021)   Last A1C: 8.2 (Feb 2021)   Medications: Insulin NPH 10u nightly, Insulin regular 10u TID with meals  Medication Compliance: yes  Medication Side Effects             Hypoglycemia: no   Chronic HTN Disease Monitoring:  Home BP Monitoring - Does not monitor at home. Cancer center checks at her regular visits.  Chest pain- no  Dyspnea- no Headache - no  Medications: Metoprolol 25 mg daily  Compliance- yes Lightheadedness- no  Edema- no          Past Medical History:  Diagnosis Date  . Anxiety state, unspecified   . Arthritis   . Breast cancer of lower-inner quadrant of right female breast ([place])   . Carpal tunnel syndrome   . Complication of anesthesia    woke up during ganglion cyst removal in her 20's, not woken up since  . Diabetes mellitus without complication ([place])    Type II  . Heart murmur   . History of radiation therapy 12/18/18- 01/30/19   Right Chest wall 25 fractions X 2Gy each to  total 50 Gy, followed by a boost 10 Gy in 5 fractions.   . Hyperlipidemia   . Hyperthyroidism   . Mitral valve prolapse   . Smoker   . Tachycardia   . 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 Naproxen Shortness Of Breath  . Codeine Hives  . Hydrocodone-Acetaminophen Hives  . Ibuprofen     Irritant to stomach r/t diverticulitis  . Lantus [Insulin Glargine]     Yeast Infections  . Meloxicam Other (See Comments)    Abdominal pain   . Propoxyphene N-Acetaminophen Hives  . Sulfonamide Derivatives Hives  . Tramadol Other (See Comments)    Abdominal Pain    Current Outpatient Medications on File Prior to Visit  Medication Sig Dispense Refill  . Accu-Chek FastClix Lancets MISC USE TO TEST BLOOD SUGAR UP TO FOUR TIMES DAILY AS DIRECTED 204 each 3  . ACCU-CHEK GUIDE test strip USE TO CHECK BLOOD SUGAR UP TO 4 TIMES A DAY. DX: E11.65 100 strip 5  . Alpha-Lipoic Acid 200 MG CAPS Take 200 mg by mouth daily.    . BD INSULIN SYRINGE U/F 31G X 5/16" 0.5 ML MISC USE TO  ADMINISTER INSULIN 3 TIMES DAILY WITH MEALS    . diclofenac (VOLTAREN) 75 MG EC tablet Take 75 mg by mouth 2 (two) times daily.     . ferrous sulfate 325 (65 FE) MG tablet Take 325 mg by mouth daily with breakfast.    . gabapentin (NEURONTIN) 100 MG capsule Take 3 capsules (300 mg total) by mouth 3 (three) times daily. 90 capsule 1  . IBRANCE 125 MG tablet TAKE 1 TABLET (125 MG TOTAL) BY MOUTH DAILY. TAKE FOR 21 DAYS ON, 7 DAYS OFF, REPEAT EVERY 28 DAYS. 21 tablet 3  . insulin NPH Human (HUMULIN N) 100 UNIT/ML injection Inject 0.1 mLs (10 Units total) into the skin at bedtime. 10 mL 11  . insulin regular (HUMULIN R) 100 units/mL injection Inject 0.1 mLs (10 Units total) into the skin 3 (three) times daily before meals. E.11.9. Hold dose for blood sugar less than 125 10 mL 11  . letrozole (FEMARA) 2.5 MG tablet Take 1 tablet (2.5 mg  total) by mouth daily. 90 tablet 3  . metoprolol succinate (TOPROL XL) 25 MG 24 hr tablet Take 1 tablet (25 mg total) by mouth daily. 90 tablet 3  . Milk Thistle 1000 MG CAPS Take 1,000 mg by mouth daily.     . Multiple Vitamin (MULTIVITAMIN WITH MINERALS) TABS tablet Take 1 tablet by mouth daily.    . pseudoephedrine (SUDAFED) 120 MG 12 hr tablet Take 120 mg by mouth 2 (two) times daily.    . SUPER B COMPLEX/C PO Take 1 tablet by mouth daily.     No current facility-administered medications on file prior to visit.    Observations/Objective: NAD. Speaking clearly.  Work of breathing normal.  Alert and oriented. Mood appropriate.   Assessment and Plan: 1. Yeast vaginitis Recently took antibiotics from urgent care due to presumed acute cystitis. Having yeast infection symptoms, will treat with Diflucan.  - fluconazole (DIFLUCAN) 150 MG tablet; Take one tablet now. Take second tablet in 72 hours if still feeling symptoms.  Dispense: 2 tablet; Refill: 0  2. Nasal congestion - pseudoephedrine (SUDAFED) 120 MG 12 hr tablet; Take 1 tablet (120 mg total) by mouth every 12 (twelve) hours as needed for congestion.  Dispense: 30 tablet; Refill: 1  3. Essential hypertension BP trend seems to be well controlled on review of EMR. Continue Metoprolol.  Counseled on blood pressure goal of less than 130/80, low-sodium, DASH diet, medication compliance, 150 minutes of moderate intensity exercise per week. Discussed medication compliance, adverse effects.  4. Type 2 diabetes mellitus without complication, with long-term current use of insulin (HCC) Last A1c was 8.2%. We increased long acting insulin from 8 to 10u daily. Has had improvement in her fasting CBGs but still above goal. Will repeat A1c and adjust as needed.  Counseled on Diabetic diet, my plate method, [id number] minutes of moderate intensity exercise/week Blood sugar logs with fasting goals of 80-120 mg/dl, random of less than 180 and in the event  of sugars less than 60 mg/dl or greater than 400 mg/dl encouraged to notify the clinic. Advised on the need for annual eye exams, annual foot exams, Pneumonia vaccine. - Hemoglobin A1c; Future   Follow Up Instructions: Lab visit 5/21    I discussed the assessment and treatment plan with the patient. The patient was provided an opportunity to ask questions and all were answered. The patient agreed with the plan and demonstrated an understanding of the instructions.   The patient was advised to call  back or seek an in-person evaluation if the symptoms worsen or if the condition fails to improve as anticipated.     I provided 16 minutes total of non-face-to-face time during this encounter including median intraservice time, reviewing previous notes, investigations, ordering medications, medical decision making, coordinating care and patient verbalized understanding at the end of the visit.    Phill Myron, D.O. Primary Care at [facility]  07/11/2019, 9:28 AM

## 2019-07-13 ENCOUNTER — Other Ambulatory Visit: Payer: Self-pay | Admitting: Hematology and Oncology

## 2019-07-13 ENCOUNTER — Other Ambulatory Visit (INDEPENDENT_AMBULATORY_CARE_PROVIDER_SITE_OTHER): Payer: Medicaid Other

## 2019-07-13 ENCOUNTER — Other Ambulatory Visit: Payer: Self-pay

## 2019-07-13 DIAGNOSIS — E119 Type 2 diabetes mellitus without complications: Secondary | ICD-10-CM | POA: Diagnosis not present

## 2019-07-13 DIAGNOSIS — C50311 Malignant neoplasm of lower-inner quadrant of right female breast: Secondary | ICD-10-CM

## 2019-07-13 DIAGNOSIS — Z794 Long term (current) use of insulin: Secondary | ICD-10-CM

## 2019-07-13 DIAGNOSIS — Z170 Estrogen receptor positive status [ER+]: Secondary | ICD-10-CM

## 2019-07-14 LAB — HEMOGLOBIN A1C
Est. average glucose Bld gHb Est-mCnc: 174 mg/dL
Hgb A1c MFr Bld: 7.7 % — ABNORMAL HIGH (ref 4.8–5.6)

## 2019-07-16 ENCOUNTER — Other Ambulatory Visit: Payer: Self-pay | Admitting: Hematology and Oncology

## 2019-07-17 MED FILL — IBRANCE 125 MG TABS: 125 | 28 days supply | Qty: 21 | Fill #0

## 2019-07-20 NOTE — Progress Notes (Signed)
Patient notified of results/recommendations.

## 2019-07-26 ENCOUNTER — Other Ambulatory Visit: Payer: Self-pay

## 2019-07-26 ENCOUNTER — Inpatient Hospital Stay (HOSPITAL_BASED_OUTPATIENT_CLINIC_OR_DEPARTMENT_OTHER): Payer: Medicaid Other | Admitting: Medical

## 2019-07-26 ENCOUNTER — Other Ambulatory Visit: Payer: Self-pay | Admitting: *Deleted

## 2019-07-26 ENCOUNTER — Inpatient Hospital Stay: Payer: Medicaid Other | Attending: Medical

## 2019-07-26 ENCOUNTER — Inpatient Hospital Stay: Payer: Medicaid Other

## 2019-07-26 VITALS — BP 118/72 | HR 78 | Temp 98.2°F | Resp 18

## 2019-07-26 DIAGNOSIS — C50311 Malignant neoplasm of lower-inner quadrant of right female breast: Secondary | ICD-10-CM

## 2019-07-26 DIAGNOSIS — Z9011 Acquired absence of right breast and nipple: Secondary | ICD-10-CM | POA: Insufficient documentation

## 2019-07-26 DIAGNOSIS — Z79811 Long term (current) use of aromatase inhibitors: Secondary | ICD-10-CM | POA: Insufficient documentation

## 2019-07-26 DIAGNOSIS — Z5111 Encounter for antineoplastic chemotherapy: Secondary | ICD-10-CM | POA: Insufficient documentation

## 2019-07-26 DIAGNOSIS — C7951 Secondary malignant neoplasm of bone: Secondary | ICD-10-CM | POA: Insufficient documentation

## 2019-07-26 DIAGNOSIS — Z923 Personal history of irradiation: Secondary | ICD-10-CM | POA: Insufficient documentation

## 2019-07-26 DIAGNOSIS — Z170 Estrogen receptor positive status [ER+]: Secondary | ICD-10-CM | POA: Diagnosis not present

## 2019-07-26 DIAGNOSIS — R300 Dysuria: Secondary | ICD-10-CM

## 2019-07-26 LAB — CBC WITH DIFFERENTIAL (CANCER CENTER ONLY)
Abs Immature Granulocytes: 0.04 10*3/uL (ref 0.00–0.07)
Basophils Absolute: 0.1 10*3/uL (ref 0.0–0.1)
Basophils Relative: 2 %
Eosinophils Absolute: 0.1 10*3/uL (ref 0.0–0.5)
Eosinophils Relative: 3 %
HCT: 41.6 % (ref 36.0–46.0)
Hemoglobin: 14.1 g/dL (ref 12.0–15.0)
Immature Granulocytes: 1 %
Lymphocytes Relative: 28 %
Lymphs Abs: 1.3 10*3/uL (ref 0.7–4.0)
MCH: 35.3 pg — ABNORMAL HIGH (ref 26.0–34.0)
MCHC: 33.9 g/dL (ref 30.0–36.0)
MCV: 104 fL — ABNORMAL HIGH (ref 80.0–100.0)
Monocytes Absolute: 0.6 10*3/uL (ref 0.1–1.0)
Monocytes Relative: 14 %
Neutro Abs: 2.3 10*3/uL (ref 1.7–7.7)
Neutrophils Relative %: 52 %
Platelet Count: 358 10*3/uL (ref 150–400)
RBC: 4 MIL/uL (ref 3.87–5.11)
RDW: 13.6 % (ref 11.5–15.5)
WBC Count: 4.4 10*3/uL (ref 4.0–10.5)
nRBC: 0 % (ref 0.0–0.2)

## 2019-07-26 LAB — URINALYSIS, COMPLETE (UACMP) WITH MICROSCOPIC
Bacteria, UA: NONE SEEN
Bilirubin Urine: NEGATIVE
Glucose, UA: >=500 mg/dL — AB
Hgb urine dipstick: NEGATIVE
Ketones, ur: NEGATIVE mg/dL
Leukocytes,Ua: NEGATIVE
Nitrite: NEGATIVE
Protein, ur: NEGATIVE mg/dL
Specific Gravity, Urine: 1.017 (ref 1.005–1.030)
pH: 6 (ref 5.0–8.0)

## 2019-07-26 LAB — CMP (CANCER CENTER ONLY)
ALT: 29 U/L (ref 0–44)
AST: 19 U/L (ref 15–41)
Albumin: 3.6 g/dL (ref 3.5–5.0)
Alkaline Phosphatase: 79 U/L (ref 38–126)
Anion gap: 13 (ref 5–15)
BUN: 12 mg/dL (ref 6–20)
CO2: 22 mmol/L (ref 22–32)
Calcium: 9.7 mg/dL (ref 8.9–10.3)
Chloride: 103 mmol/L (ref 98–111)
Creatinine: 0.91 mg/dL (ref 0.44–1.00)
GFR, Est AFR Am: >60 mL/min (ref >60)
GFR, Estimated: >60 mL/min (ref >60)
Glucose, Bld: 306 mg/dL — ABNORMAL HIGH (ref 70–99)
Potassium: 4.1 mmol/L (ref 3.5–5.1)
Sodium: 138 mmol/L (ref 135–145)
Total Bilirubin: 0.5 mg/dL (ref 0.3–1.2)
Total Protein: 6.9 g/dL (ref 6.5–8.1)

## 2019-07-26 MED ORDER — DENOSUMAB 120 MG/1.7ML ~~LOC~~ SOLN
SUBCUTANEOUS | Status: AC
  Filled 2019-07-26: qty 1.7

## 2019-07-26 MED ORDER — DENOSUMAB 120 MG/1.7ML ~~LOC~~ SOLN
120.0000 mg | Freq: Once | SUBCUTANEOUS | Status: AC
  Administered 2019-07-26: 120 mg via SUBCUTANEOUS

## 2019-07-26 MED ORDER — PHENAZOPYRIDINE HCL 100 MG PO TABS: 100.0000 mg | ORAL_TABLET | Freq: Three times a day (TID) | ORAL | 0 refills | Status: DC | PRN

## 2019-07-26 NOTE — Progress Notes (Signed)
Received call from pt with complaint of increased urination, burning with urination and back pain. Per MD pt to have uranialysis done and to see Sandi Mealy, PA in [facility] for further evaluation and treatment.

## 2019-07-26 NOTE — Progress Notes (Signed)
The patient reports a recent history of dysuria.  Her urinalysis does not indicate a urinary tract infection.  Her urinalysis showed a glucose of greater than 500 with her labs showing a serum glucose of 306.  She was told that her urinary frequency was likely related to her elevated serum glucose.  We will await the results of her urine culture.  She was given a prescription for Pyridium to use.  Sandi Mealy, MHS, PA-C Physician Assistant

## 2019-07-26 NOTE — Patient Instructions (Signed)
Denosumab injection What is this medicine? DENOSUMAB (den oh sue mab) slows bone breakdown. Prolia is used to treat osteoporosis in women after menopause and in men, and in people who are taking corticosteroids for 6 months or more. Xgeva is used to treat a high calcium level due to cancer and to prevent bone fractures and other bone problems caused by multiple myeloma or cancer bone metastases. Xgeva is also used to treat giant cell tumor of the bone. This medicine may be used for other purposes; ask your health care provider or pharmacist if you have questions. COMMON BRAND NAME(S): Prolia, XGEVA What should I tell my health care provider before I take this medicine? They need to know if you have any of these conditions:  dental disease  having surgery or tooth extraction  infection  kidney disease  low levels of calcium or Vitamin D in the blood  malnutrition  on hemodialysis  skin conditions or sensitivity  thyroid or parathyroid disease  an unusual reaction to denosumab, other medicines, foods, dyes, or preservatives  pregnant or trying to get pregnant  breast-feeding How should I use this medicine? This medicine is for injection under the skin. It is given by a health care professional in a hospital or clinic setting. A special MedGuide will be given to you before each treatment. Be sure to read this information carefully each time. For Prolia, talk to your pediatrician regarding the use of this medicine in children. Special care may be needed. For Xgeva, talk to your pediatrician regarding the use of this medicine in children. While this drug may be prescribed for children as young as 13 years for selected conditions, precautions do apply. Overdosage: If you think you have taken too much of this medicine contact a poison control center or emergency room at once. NOTE: This medicine is only for you. Do not share this medicine with others. What if I miss a dose? It is  important not to miss your dose. Call your doctor or health care professional if you are unable to keep an appointment. What may interact with this medicine? Do not take this medicine with any of the following medications:  other medicines containing denosumab This medicine may also interact with the following medications:  medicines that lower your chance of fighting infection  steroid medicines like prednisone or cortisone This list may not describe all possible interactions. Give your health care provider a list of all the medicines, herbs, non-prescription drugs, or dietary supplements you use. Also tell them if you smoke, drink alcohol, or use illegal drugs. Some items may interact with your medicine. What should I watch for while using this medicine? Visit your doctor or health care professional for regular checks on your progress. Your doctor or health care professional may order blood tests and other tests to see how you are doing. Call your doctor or health care professional for advice if you get a fever, chills or sore throat, or other symptoms of a cold or flu. Do not treat yourself. This drug may decrease your body's ability to fight infection. Try to avoid being around people who are sick. You should make sure you get enough calcium and vitamin D while you are taking this medicine, unless your doctor tells you not to. Discuss the foods you eat and the vitamins you take with your health care professional. See your dentist regularly. Brush and floss your teeth as directed. Before you have any dental work done, tell your dentist you are   receiving this medicine. Do not become pregnant while taking this medicine or for 5 months after stopping it. Talk with your doctor or health care professional about your birth control options while taking this medicine. Women should inform their doctor if they wish to become pregnant or think they might be pregnant. There is a potential for serious side  effects to an unborn child. Talk to your health care professional or pharmacist for more information. What side effects may I notice from receiving this medicine? Side effects that you should report to your doctor or health care professional as soon as possible:  allergic reactions like skin rash, itching or hives, swelling of the face, lips, or tongue  bone pain  breathing problems  dizziness  jaw pain, especially after dental work  redness, blistering, peeling of the skin  signs and symptoms of infection like fever or chills; cough; sore throat; pain or trouble passing urine  signs of low calcium like fast heartbeat, muscle cramps or muscle pain; pain, tingling, numbness in the hands or feet; seizures  unusual bleeding or bruising  unusually weak or tired Side effects that usually do not require medical attention (report to your doctor or health care professional if they continue or are bothersome):  constipation  diarrhea  headache  joint pain  loss of appetite  muscle pain  runny nose  tiredness  upset stomach This list may not describe all possible side effects. Call your doctor for medical advice about side effects. You may report side effects to FDA at 1-800-FDA-1088. Where should I keep my medicine? This medicine is only given in a clinic, doctor's office, or other health care setting and will not be stored at home. NOTE: This sheet is a summary. It may not cover all possible information. If you have questions about this medicine, talk to your doctor, pharmacist, or health care provider.  2020 Elsevier/Gold Standard (2017-06-17 16:10:44)

## 2019-07-27 ENCOUNTER — Telehealth: Payer: Self-pay | Admitting: Internal Medicine

## 2019-07-27 LAB — URINE CULTURE: Culture: <10000 — AB

## 2019-07-27 NOTE — Telephone Encounter (Signed)
We can see her in person on 6/17. I am happy to place referral to endocrinology for DM management. What does she want to see urology for?   Phill Myron, D.O. Primary Care at [facility]  07/27/2019, 11:07 AM

## 2019-07-27 NOTE — Telephone Encounter (Signed)
Called and lvm for pt to call office back.

## 2019-07-27 NOTE — Telephone Encounter (Signed)
Called patient because she had concerns with her appt on 08/09/19. Patient wants to be seen in person because of her multiple concerns. Patient would also like to have a referral to the urologist and a diabetic doctor.Please f/u

## 2019-07-27 NOTE — Telephone Encounter (Signed)
Pt stated that there is something going on with her bladder and in her back she feels like its on fire

## 2019-08-01 ENCOUNTER — Telehealth: Payer: Self-pay | Admitting: *Deleted

## 2019-08-01 NOTE — Telephone Encounter (Signed)
Called pt lvm .

## 2019-08-01 NOTE — Telephone Encounter (Signed)
Received call from pt with complaint of spasms/twitching in her fingers and toes.  Pt states she believes she has arthrisits and would like to be been by Dr. Lindi Adie.  Per MD pt potassium WNL and will need to f/u with PCP for further evaluation and treatment of arthritis.  Pt verbalized understanding.

## 2019-08-01 NOTE — Telephone Encounter (Signed)
This sounds like something that needs to be seen a lot sooner than through a urology appointment. I would recommend patient be seen at Urgent Care or ER as this sounds concerning for kidney stones.   Phill Myron, D.O. Primary Care at [facility]  08/01/2019, 2:37 PM

## 2019-08-02 ENCOUNTER — Telehealth: Payer: Self-pay | Admitting: Hematology and Oncology

## 2019-08-02 ENCOUNTER — Telehealth: Payer: Self-pay | Admitting: Internal Medicine

## 2019-08-02 MED ORDER — "BD INSULIN SYRINGE U/F 31G X 5/16"" 0.5 ML MISC": 0 refills | Status: DC

## 2019-08-02 NOTE — Telephone Encounter (Signed)
Scheduled appt per 6/9 sch message - left message with appt date and time .  Unable to reach pt.

## 2019-08-02 NOTE — Telephone Encounter (Signed)
1) Medication(s) Requested (by name):BD INSULIN SYRINGE U/F 31G X 5/16" 0.5 ML MISC [[id number]]    2) Pharmacy of Choice:[id number] - [place], [place]  [address] [place] [address]   3) Special Requests:   Approved medications will be sent to the pharmacy, we will reach out if there is an issue.  Requests made after 3pm may not be addressed until the following business day!  If a patient is unsure of the name of the medication(s) please note and ask patient to call back when they are able to provide all info, do not send to responsible party until all information is available!

## 2019-08-07 DIAGNOSIS — M79641 Pain in right hand: Secondary | ICD-10-CM | POA: Diagnosis not present

## 2019-08-07 DIAGNOSIS — E559 Vitamin D deficiency, unspecified: Secondary | ICD-10-CM | POA: Diagnosis not present

## 2019-08-07 DIAGNOSIS — M2042 Other hammer toe(s) (acquired), left foot: Secondary | ICD-10-CM | POA: Diagnosis not present

## 2019-08-07 DIAGNOSIS — M25572 Pain in left ankle and joints of left foot: Secondary | ICD-10-CM | POA: Diagnosis not present

## 2019-08-08 ENCOUNTER — Telehealth: Payer: Self-pay

## 2019-08-08 NOTE — Telephone Encounter (Signed)
Called patient to do their pre-visit COVID screening.  Call went to voicemail. Unable to do prescreening.  

## 2019-08-09 ENCOUNTER — Ambulatory Visit (INDEPENDENT_AMBULATORY_CARE_PROVIDER_SITE_OTHER): Payer: Medicaid Other | Admitting: Internal Medicine

## 2019-08-09 ENCOUNTER — Encounter: Payer: Self-pay | Admitting: Internal Medicine

## 2019-08-09 ENCOUNTER — Other Ambulatory Visit: Payer: Self-pay

## 2019-08-09 VITALS — BP 98/66 | HR 78 | Temp 97.5°F | Resp 17 | Wt 162.0 lb

## 2019-08-09 DIAGNOSIS — R81 Glycosuria: Secondary | ICD-10-CM | POA: Diagnosis not present

## 2019-08-09 DIAGNOSIS — R350 Frequency of micturition: Secondary | ICD-10-CM | POA: Diagnosis not present

## 2019-08-09 DIAGNOSIS — R300 Dysuria: Secondary | ICD-10-CM

## 2019-08-09 NOTE — Telephone Encounter (Signed)
error 

## 2019-08-09 NOTE — Progress Notes (Signed)
  Subjective:    Emily Wilson - 54 y.o. female MRN [id number]  Date of birth: June 14, 1965  HPI  Emily Wilson is here for urinary concerns. Reports dysuria, frequency x1 month. History of bladder sling for incontinence about 15-20 years ago following vaginal birth of her son. She requests referral to urology.   Health Maintenance:  Health Maintenance Due  Topic Date Due  . Hepatitis C Screening  Never done  . COVID-19 Vaccine (1) Never done  . OPHTHALMOLOGY EXAM  10/29/2016  . FOOT EXAM  07/02/2018    -  reports that she quit smoking about 7 years ago. Her smoking use included cigarettes. She has a 90.00 pack-year smoking history. She has never used smokeless tobacco. - Review of Systems: Per HPI. - Past Medical History: Patient Active Problem List   Diagnosis Date Noted  . History of shingles 01/09/2019  . S/P mastectomy, right 11/16/2018  . Bone metastasis ([place]) 05/31/2018  . Family history of breast cancer in female 03/13/2015  . Malignant neoplasm of lower-inner quadrant of right breast of female, estrogen receptor positive ([place]) 02/25/2015  . DM (diabetes mellitus), type 2 ([place]) 02/25/2015  . HLD (hyperlipidemia) 02/25/2015  . Palpitations 01/26/2010  . Anxiety state 05/26/2009  . Hyperthyroidism 11/14/2006  . Hypertension 11/14/2006   - Medications: reviewed and updated   Objective:   Physical Exam BP 98/66   Pulse 78   Temp (!) 97.5 F (36.4 C) (Temporal)   Resp 17   Wt 162 lb (73.5 kg)   SpO2 95%   BMI 32.72 kg/m  Physical Exam Constitutional:      General: She is not in acute distress.    Appearance: She is not diaphoretic.  Cardiovascular:     Rate and Rhythm: Normal rate.  Pulmonary:     Effort: Pulmonary effort is normal. No respiratory distress.  Musculoskeletal:        General: Normal range of motion.  Skin:    General: Skin is warm and dry.  Neurological:     Mental Status: She is alert and oriented to person, place, and time.   Psychiatric:        Mood and Affect: Affect normal.        Judgment: Judgment normal.        Assessment & Plan:   1. Dysuria 2. Urinary frequency 3. Glucosuria Discussed with patient that my concern is that her symptoms are stemming from spilling of glucose in her urine with recent result of >500 on UA. Interestingly enough she has had fairly stable glycemic control as of late and does not take any glucose stabilizing agents that act by removal of glucose through the urine. UA at that time showed no signs concerning for infection and urine culture was negative. Will refer to urology per patient request to see if further evaluation is indicated.   - Ambulatory referral to Urology     Phill Myron, D.O. 08/09/2019, 10:49 AM Primary Care at [facility]

## 2019-08-10 ENCOUNTER — Other Ambulatory Visit: Payer: Self-pay | Admitting: Family Medicine

## 2019-08-10 DIAGNOSIS — Z794 Long term (current) use of insulin: Secondary | ICD-10-CM

## 2019-08-10 DIAGNOSIS — E1159 Type 2 diabetes mellitus with other circulatory complications: Secondary | ICD-10-CM

## 2019-08-13 MED FILL — IBRANCE 125 MG TABS: 125 | 28 days supply | Qty: 21 | Fill #1

## 2019-08-20 ENCOUNTER — Ambulatory Visit (INDEPENDENT_AMBULATORY_CARE_PROVIDER_SITE_OTHER): Payer: Medicaid Other | Admitting: Urology

## 2019-08-20 ENCOUNTER — Encounter: Payer: Self-pay | Admitting: Urology

## 2019-08-20 ENCOUNTER — Other Ambulatory Visit: Payer: Self-pay

## 2019-08-20 VITALS — BP 139/84 | HR 122 | Ht 59.0 in | Wt 162.0 lb

## 2019-08-20 DIAGNOSIS — R350 Frequency of micturition: Secondary | ICD-10-CM

## 2019-08-20 DIAGNOSIS — N3020 Other chronic cystitis without hematuria: Secondary | ICD-10-CM

## 2019-08-20 MED ORDER — MIRABEGRON ER 50 MG PO TB24: 50.0000 mg | ORAL_TABLET | Freq: Every day | ORAL | 11 refills | Status: DC

## 2019-08-20 NOTE — Patient Instructions (Signed)
Cystoscopy Cystoscopy is a procedure that is used to help diagnose and sometimes treat conditions that affect the lower urinary tract. The lower urinary tract includes the bladder and the urethra. The urethra is the tube that drains urine from the bladder. Cystoscopy is done using a thin, tube-shaped instrument with a light and camera at the end (cystoscope). The cystoscope may be hard or flexible, depending on the goal of the procedure. The cystoscope is inserted through the urethra, into the bladder. Cystoscopy may be recommended if you have:  Urinary tract infections that keep coming back.  Blood in the urine (hematuria).  An inability to control when you urinate (urinary incontinence) or an overactive bladder.  Unusual cells found in a urine sample.  A blockage in the urethra, such as a urinary stone.  Painful urination.  An abnormality in the bladder found during an intravenous pyelogram (IVP) or CT scan. Cystoscopy may also be done to remove a sample of tissue to be examined under a microscope (biopsy). What are the risks? Generally, this is a safe procedure. However, problems may occur, including:  Infection.  Bleeding.  What happens during the procedure?  1. You will be given one or more of the following: ? A medicine to numb the area (local anesthetic). 2. The area around the opening of your urethra will be cleaned. 3. The cystoscope will be passed through your urethra into your bladder. 4. Germ-free (sterile) fluid will flow through the cystoscope to fill your bladder. The fluid will stretch your bladder so that your health care provider can clearly examine your bladder walls. 5. Your doctor will look at the urethra and bladder. 6. The cystoscope will be removed The procedure may vary among health care providers  What can I expect after the procedure? After the procedure, it is common to have: 1. Some soreness or pain in your abdomen and urethra. 2. Urinary symptoms.  These include: ? Mild pain or burning when you urinate. Pain should stop within a few minutes after you urinate. This may last for up to 1 week. ? A small amount of blood in your urine for several days. ? Feeling like you need to urinate but producing only a small amount of urine. Follow these instructions at home: General instructions  Return to your normal activities as told by your health care provider.   Do not drive for 24 hours if you were given a sedative during your procedure.  Watch for any blood in your urine. If the amount of blood in your urine increases, call your health care provider.  If a tissue sample was removed for testing (biopsy) during your procedure, it is up to you to get your test results. Ask your health care provider, or the department that is doing the test, when your results will be ready.  Drink enough fluid to keep your urine pale yellow.  Keep all follow-up visits as told by your health care provider. This is important. Contact a health care provider if you:  Have pain that gets worse or does not get better with medicine, especially pain when you urinate.  Have trouble urinating.  Have more blood in your urine. Get help right away if you:  Have blood clots in your urine.  Have abdominal pain.  Have a fever or chills.  Are unable to urinate. Summary  Cystoscopy is a procedure that is used to help diagnose and sometimes treat conditions that affect the lower urinary tract.  Cystoscopy is done using   a thin, tube-shaped instrument with a light and camera at the end.  After the procedure, it is common to have some soreness or pain in your abdomen and urethra.  Watch for any blood in your urine. If the amount of blood in your urine increases, call your health care provider.  If you were prescribed an antibiotic medicine, take it as told by your health care provider. Do not stop taking the antibiotic even if you start to feel better. This  information is not intended to replace advice given to you by your health care provider. Make sure you discuss any questions you have with your health care provider. Document Revised: 01/31/2018 Document Reviewed: 01/31/2018 Elsevier Patient Education  2020 Else[employer] Inc.   

## 2019-08-20 NOTE — Progress Notes (Signed)
08/20/2019 3:27 PM   Emily Wilson 04/11/1965 [id number]  Referring provider: [name], DO [address] [place],  [place] [address]  Chief Complaint  Patient presents with  . Urinary Frequency    HPI: I was consulted to assist the patient 6-week history of suprapubic discomfort and burning.  It lessens with urination but comes back soon after periods associated with a constant need that she needs to urinate.  She thinks she has had 2 bladder infections recently.  She takes Azo-Standard.  She has had some burning in her lower back.  She did not have the symptoms a few months ago.  Things are improving some and more intermittent now.  She voids every hour and cannot hold it for 2 hours.  She gets up 3-4 times at night.  She has rare urge incontinence and does not wear a pad.  She had a previous sling.  She has a normal CT scan in February 2021.  She has a history of metastatic breast cancer currently being treated.  She recently had 2 - urine cultures  No history of kidney stones.  Insulin-dependent diabetic.  Has had a hysterectomy.  Prone to constipation.  Has fractured her back.   PMH: Past Medical History:  Diagnosis Date  . Anxiety state, unspecified   . Arthritis   . Breast cancer of lower-inner quadrant of right female breast ([facility])   . Carpal tunnel syndrome   . Complication of anesthesia    woke up during ganglion cyst removal in her 20's, not woken up since  . Diabetes mellitus without complication ([place] )    Type II  . Heart murmur   . History of radiation therapy 12/18/18- 01/30/19   Right Chest wall 25 fractions X 2Gy each to total 50 Gy, followed by a boost 10 Gy in 5 fractions.   . Hyperlipidemia   . Hyperthyroidism   . Mitral valve prolapse   . Smoker   . Tachycardia   . 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 ABDOMINAL HYSTERECTOMY    . APPENDECTOMY    . CARPAL TUNNEL RELEASE    . GANGLION CYST EXCISION Right   . KNEE SURGERY Right 2007   ACL repair  . TOTAL MASTECTOMY Right 11/16/2018   Procedure: RIGHT MASTECTOMY;  Surgeon: Coralie Keens, MD;  Location: [facility];  Service: General;  Laterality: Right;  . TUBAL LIGATION      Home Medications:  Allergies as of 08/20/2019      Reactions   Naproxen Shortness Of Breath   Codeine Hives   Hydrocodone-acetaminophen Hives   Ibuprofen    Irritant to stomach r/t diverticulitis   Lantus [insulin Glargine]    Yeast Infections   Meloxicam Other (See Comments)   Abdominal pain   Propoxyphene N-acetaminophen Hives   Sulfonamide Derivatives Hives   Tramadol Other (See Comments)   Abdominal Pain      Medication List       Accurate as of August 20, 2019  3:27 PM. If you have any questions, ask your nurse or doctor.        Accu-Chek FastClix Lancets Misc USE TO TEST BLOOD SUGAR UP TO FOUR TIMES DAILY AS DIRECTED   Accu-Chek Guide test strip Generic drug: glucose blood USE TO CHECK BLOOD SUGAR UP TO 4 TIMES A DAY. DX: E11.65  Alpha-Lipoic Acid 200 MG Caps Take 200 mg by mouth daily.   BD Insulin Syringe U/F 31G X 5/16" 0.5 ML Misc Generic drug: Insulin Syringe-Needle U-100 USE TO ADMINISTER INSULIN 3 TIMES DAILY WITH MEALS   diclofenac 75 MG EC tablet Commonly known as: VOLTAREN Take 75 mg by mouth 2 (two) times daily.   ferrous sulfate 325 (65 FE) MG tablet Take 325 mg by mouth daily with breakfast.   gabapentin 100 MG capsule Commonly known as: NEURONTIN Take 3 capsules (300 mg total) by mouth 3 (three) times daily.   Ibrance 125 MG tablet Generic drug: palbociclib TAKE 1 TABLET (125 MG TOTAL) BY MOUTH DAILY. TAKE FOR 21 DAYS ON, 7 DAYS OFF, REPEAT EVERY 28 DAYS.   insulin NPH Human 100 UNIT/ML injection Commonly known as: HumuLIN N Inject 0.1 mLs (10 Units total) into the skin at bedtime. What changed:  how much to take   insulin regular 100 units/mL injection Commonly known as: HumuLIN R Inject 0.1 mLs (10 Units total) into the skin 3 (three) times daily before meals. E.11.9. Hold dose for blood sugar less than 125   letrozole 2.5 MG tablet Commonly known as: FEMARA TAKE 1 TABLET BY MOUTH EVERY DAY   metoprolol succinate 25 MG 24 hr tablet Commonly known as: Toprol XL Take 1 tablet (25 mg total) by mouth daily.   Milk Thistle 1000 MG Caps Take 1,000 mg by mouth daily.   multivitamin with minerals Tabs tablet Take 1 tablet by mouth daily.   phenazopyridine 100 MG tablet Commonly known as: Pyridium Take 1 tablet (100 mg total) by mouth 3 (three) times daily as needed for pain.   pseudoephedrine 120 MG 12 hr tablet Commonly known as: SUDAFED Take 1 tablet (120 mg total) by mouth every 12 (twelve) hours as needed for congestion.   SUPER B COMPLEX/C PO Take 1 tablet by mouth daily.   Voltaren 1 % Gel Generic drug: diclofenac Sodium SMARTSIG:1 Sparingly Topical 3 Times Daily PRN       Allergies:  Allergies  Allergen Reactions  . Naproxen Shortness Of Breath  . Codeine Hives  . Hydrocodone-Acetaminophen Hives  . Ibuprofen     Irritant to stomach r/t diverticulitis  . Lantus [Insulin Glargine]     Yeast Infections  . Meloxicam Other (See Comments)    Abdominal pain   . Propoxyphene N-Acetaminophen Hives  . Sulfonamide Derivatives Hives  . Tramadol Other (See Comments)    Abdominal Pain    Family History: Family History  Problem Relation Age of Onset  . Diabetes Mother   . Bladder Cancer Mother 29       smoker  . Other Mother 30       TAH for unspecified reason  . Multiple sclerosis Mother   . Cancer Father 70       lymphatic/tonsil cancer - in remission; former smoker  . COPD Maternal Grandmother        smoker  . Emphysema Maternal Grandmother        smoker  . Stroke Maternal Grandfather   . Dementia Maternal Uncle   . Diabetes Paternal Grandmother    . COPD Maternal Uncle        smoker  . Multiple sclerosis Paternal Aunt   . Breast cancer Paternal Aunt        dx. late 53s - early 6s    Social History:  reports that she quit smoking about 7 years ago. Her smoking use included cigarettes. She has  a 90.00 pack-year smoking history. She has never used smokeless tobacco. She reports that she does not drink alcohol and does not use drugs.  ROS:                                        Physical Exam: BP 139/84   Pulse (!) 122   Ht 4\' 11"  (1.499 m)   Wt 162 lb (73.5 kg)   BMI 32.72 kg/m   Constitutional:  Alert and oriented, No acute distress. HEENT: [place] AT, moist mucus membranes.  Trachea midline, no masses. Cardiovascular: No clubbing, cyanosis, or edema. Respiratory: Normal respiratory effort, no increased work of breathing. GI: Abdomen is soft, nontender, nondistended, no abdominal masses GU: No CVA tenderness.  High grade 2 cystocele.  No atrophy.  No levator or bladder tenderness Skin: No rashes, bruises or suspicious lesions. Lymph: No cervical or inguinal adenopathy. Neurologic: Grossly intact, no focal deficits, moving all 4 extremities. Psychiatric: Normal mood and affect.  Laboratory Data: Lab Results  Component Value Date   WBC 4.4 07/26/2019   HGB 14.1 07/26/2019   HCT 41.6 07/26/2019   MCV 104.0 (H) 07/26/2019   PLT 358 07/26/2019    Lab Results  Component Value Date   CREATININE 0.91 07/26/2019    No results found for: PSA  No results found for: TESTOSTERONE  Lab Results  Component Value Date   HGBA1C 7.7 (H) 07/13/2019    Urinalysis    Component Value Date/Time   COLORURINE YELLOW 07/26/2019 1106   APPEARANCEUR CLEAR 07/26/2019 1106   LABSPEC 1.017 07/26/2019 1106   PHURINE 6.0 07/26/2019 1106   GLUCOSEU >=500 (A) 07/26/2019 1106   HGBUR NEGATIVE 07/26/2019 1106   [place] 07/26/2019 1106   BILIRUBINUR negative 06/29/2019 1348   BILIRUBINUR neg 10/27/2012  0902   KETONESUR NEGATIVE 07/26/2019 1106   PROTEINUR NEGATIVE 07/26/2019 1106   UROBILINOGEN 1.0 06/29/2019 1348   UROBILINOGEN 0.2 02/28/2018 1743   NITRITE NEGATIVE 07/26/2019 1106   LEUKOCYTESUR NEGATIVE 07/26/2019 1106    Pertinent Imaging:   Assessment & Plan: Patient has a 6-week history of suprapubic discomfort associated with frequency.  She also feels a pressure she has had a previous sling.  She has metastatic breast cancer.  They have ordered another  CT scan and had a bone scan..  She said negative cultures.  It is awfully early to diagnose her with interstitial cystitis.  I thought was reasonable to send the urine for culture and put her on Myrbetriq and have her come back for clinical reassessment cystoscopy in about 6 or 7 weeks.  Urine culture sent  1. Urinary frequency  - Urinalysis, Complete - BLADDER SCAN AMB NON-IMAGING   No follow-ups on file.  Reece Packer, MD  [facility] [address] [place], [place] [address] [phone]

## 2019-08-21 LAB — MICROSCOPIC EXAMINATION: RBC, Urine: NONE SEEN /hpf (ref 0–2)

## 2019-08-21 LAB — URINALYSIS, COMPLETE
Bilirubin, UA: NEGATIVE
Ketones, UA: NEGATIVE
Leukocytes,UA: NEGATIVE
Nitrite, UA: NEGATIVE
Protein,UA: NEGATIVE
RBC, UA: NEGATIVE
Specific Gravity, UA: 1.025 (ref 1.005–1.030)
Urobilinogen, Ur: 0.2 mg/dL (ref 0.2–1.0)
pH, UA: 5 (ref 5.0–7.5)

## 2019-08-23 LAB — CULTURE, URINE COMPREHENSIVE

## 2019-08-29 ENCOUNTER — Other Ambulatory Visit: Payer: Self-pay | Admitting: *Deleted

## 2019-09-03 ENCOUNTER — Other Ambulatory Visit: Payer: Self-pay | Admitting: Family Medicine

## 2019-09-04 DIAGNOSIS — M797 Fibromyalgia: Secondary | ICD-10-CM | POA: Diagnosis not present

## 2019-09-04 DIAGNOSIS — E559 Vitamin D deficiency, unspecified: Secondary | ICD-10-CM | POA: Diagnosis not present

## 2019-09-10 ENCOUNTER — Inpatient Hospital Stay: Payer: Medicaid Other | Attending: Medical

## 2019-09-10 ENCOUNTER — Other Ambulatory Visit: Payer: Self-pay

## 2019-09-10 DIAGNOSIS — M2550 Pain in unspecified joint: Secondary | ICD-10-CM | POA: Diagnosis not present

## 2019-09-10 DIAGNOSIS — Z170 Estrogen receptor positive status [ER+]: Secondary | ICD-10-CM

## 2019-09-10 DIAGNOSIS — M791 Myalgia, unspecified site: Secondary | ICD-10-CM | POA: Diagnosis not present

## 2019-09-10 DIAGNOSIS — C7951 Secondary malignant neoplasm of bone: Secondary | ICD-10-CM | POA: Diagnosis not present

## 2019-09-10 DIAGNOSIS — R5383 Other fatigue: Secondary | ICD-10-CM | POA: Diagnosis not present

## 2019-09-10 DIAGNOSIS — C50311 Malignant neoplasm of lower-inner quadrant of right female breast: Secondary | ICD-10-CM | POA: Diagnosis not present

## 2019-09-10 DIAGNOSIS — E559 Vitamin D deficiency, unspecified: Secondary | ICD-10-CM | POA: Diagnosis not present

## 2019-09-10 LAB — CBC WITH DIFFERENTIAL (CANCER CENTER ONLY)
Abs Immature Granulocytes: 0.02 10*3/uL (ref 0.00–0.07)
Basophils Absolute: 0.1 10*3/uL (ref 0.0–0.1)
Basophils Relative: 1 %
Eosinophils Absolute: 0.3 10*3/uL (ref 0.0–0.5)
Eosinophils Relative: 4 %
HCT: 44.5 % (ref 36.0–46.0)
Hemoglobin: 14.8 g/dL (ref 12.0–15.0)
Immature Granulocytes: 0 %
Lymphocytes Relative: 25 %
Lymphs Abs: 1.7 10*3/uL (ref 0.7–4.0)
MCH: 33.9 pg (ref 26.0–34.0)
MCHC: 33.3 g/dL (ref 30.0–36.0)
MCV: 102.1 fL — ABNORMAL HIGH (ref 80.0–100.0)
Monocytes Absolute: 0.6 10*3/uL (ref 0.1–1.0)
Monocytes Relative: 9 %
Neutro Abs: 4 10*3/uL (ref 1.7–7.7)
Neutrophils Relative %: 61 %
Platelet Count: 301 10*3/uL (ref 150–400)
RBC: 4.36 MIL/uL (ref 3.87–5.11)
RDW: 11.9 % (ref 11.5–15.5)
WBC Count: 6.6 10*3/uL (ref 4.0–10.5)
nRBC: 0 % (ref 0.0–0.2)

## 2019-09-10 LAB — CMP (CANCER CENTER ONLY)
ALT: 19 U/L (ref 0–44)
AST: 15 U/L (ref 15–41)
Albumin: 3.8 g/dL (ref 3.5–5.0)
Alkaline Phosphatase: 73 U/L (ref 38–126)
Anion gap: 10 (ref 5–15)
BUN: 17 mg/dL (ref 6–20)
CO2: 24 mmol/L (ref 22–32)
Calcium: 9.7 mg/dL (ref 8.9–10.3)
Chloride: 105 mmol/L (ref 98–111)
Creatinine: 0.76 mg/dL (ref 0.44–1.00)
GFR, Est AFR Am: >60 mL/min (ref >60)
GFR, Estimated: >60 mL/min (ref >60)
Glucose, Bld: 129 mg/dL — ABNORMAL HIGH (ref 70–99)
Potassium: 4.1 mmol/L (ref 3.5–5.1)
Sodium: 139 mmol/L (ref 135–145)
Total Bilirubin: 0.4 mg/dL (ref 0.3–1.2)
Total Protein: 7.6 g/dL (ref 6.5–8.1)

## 2019-09-10 MED FILL — IBRANCE 125 MG TABS: 125 | 28 days supply | Qty: 21 | Fill #2

## 2019-09-19 ENCOUNTER — Ambulatory Visit (HOSPITAL_COMMUNITY): Payer: Medicaid Other

## 2019-09-19 ENCOUNTER — Ambulatory Visit (HOSPITAL_COMMUNITY)
Admission: RE | Admit: 2019-09-19 | Discharge: 2019-09-19 | Disposition: A | Discharge status: Home / Self Care | Payer: Medicaid Other | Source: Ambulatory Visit | Attending: Hematology and Oncology | Admitting: Hematology and Oncology

## 2019-09-19 ENCOUNTER — Other Ambulatory Visit: Payer: Self-pay

## 2019-09-19 DIAGNOSIS — Z170 Estrogen receptor positive status [ER+]: Secondary | ICD-10-CM | POA: Insufficient documentation

## 2019-09-19 DIAGNOSIS — Z853 Personal history of malignant neoplasm of breast: Secondary | ICD-10-CM | POA: Diagnosis not present

## 2019-09-19 DIAGNOSIS — C50311 Malignant neoplasm of lower-inner quadrant of right female breast: Secondary | ICD-10-CM | POA: Diagnosis not present

## 2019-09-19 DIAGNOSIS — C7951 Secondary malignant neoplasm of bone: Secondary | ICD-10-CM | POA: Diagnosis not present

## 2019-09-19 MED ORDER — SODIUM CHLORIDE (PF) 0.9 % IJ SOLN
INTRAMUSCULAR | Status: AC
  Filled 2019-09-19: qty 50

## 2019-09-19 MED ORDER — IOHEXOL 300 MG/ML  SOLN
100.0000 mL | Freq: Once | INTRAMUSCULAR | Status: AC | PRN
  Administered 2019-09-19: 100 mL via INTRAVENOUS

## 2019-09-21 ENCOUNTER — Telehealth: Payer: Self-pay | Admitting: *Deleted

## 2019-09-21 NOTE — Telephone Encounter (Signed)
Pt left VM this afternoon stating she is scheduled for a bladder procedure on 10/01/2019 " it like a surgery but not surgery "  " what do I need to do about resuming my medications for this procedure ".  This RN returned call and obtained VM. General message stating call was being returned and for pt to call again on Monday.  Note pt is on letrozole and Ibrance.

## 2019-09-24 ENCOUNTER — Telehealth: Payer: Self-pay | Admitting: *Deleted

## 2019-09-24 NOTE — Telephone Encounter (Signed)
Received vm call from pt from yesterday stating that she has r/s her biopsy for 8/23 & will discuss with Dr Lindi Adie on visit on 10th.  Message to Dr Lindi Adie.

## 2019-09-26 ENCOUNTER — Other Ambulatory Visit (HOSPITAL_COMMUNITY): Payer: Medicaid Other

## 2019-10-01 ENCOUNTER — Other Ambulatory Visit: Payer: Self-pay | Admitting: Urology

## 2019-10-01 NOTE — Progress Notes (Signed)
Patient Care Team: [name], DO as PCP - General (Family Medicine)  DIAGNOSIS:    ICD-10-CM   1. Malignant neoplasm of lower-inner quadrant of right breast of female, estrogen receptor positive (Emily Wilson)  C50.311    Z17.0     SUMMARY OF ONCOLOGIC HISTORY: Oncology History  Malignant neoplasm of lower-inner quadrant of right breast of female, estrogen receptor positive (Emily Wilson)  02/19/2015 Mammogram   Right breast irregular mass lower inner quadrant 2.2 x 1.3 x 1.7 cm with architectural distortion and skin/nipple retraction, T2 N0 stage II a clinical stage, additional benign cysts largest 1.2 cm   02/20/2015 Initial Diagnosis   Right breast biopsy 5:00: Invasive ductal carcinoma grade 2, perineural invasion present, ER 90%, PR 70%, HER-2 negative ratio 0.95, Ki-67 15%    03/07/2015 Breast MRI   right breast inferior subareolar mass measuring 2.2 x 2.4 x 2.5 cm with skin and nipple enhancement, extending inferiorly and laterally is intraductal enhancement over a distance of 4 cm, several level I right axillary lymph nodes with cortical thickening   03/13/2015 -  Anti-estrogen oral therapy   Neoadjuvant antiestrogen therapy with tamoxifen 20 mg daily (because the patient did not want to undergo surgery immediately for multiple family reasons) stopped in late 2017; anastrozole started 05/11/18   05/23/2018 Imaging   CT CAP: 2.9 cm spiculated soft tissue density central right breast, no evidence of soft tissue metastatic disease within the chest abdomen pelvis.  Subcentimeter sclerotic bone lesions T10, left pubis, right posterior ilium Bone scan: Foci of uptake left frontal calvarium, T-spine, right ilium   10/04/2018 -  Anti-estrogen oral therapy   Ibrance with anastrozole   11/16/2018 Surgery   Right mastectomy Ninfa Linden): IDC, grade 1, 5.2cm, grossly involving underlying skin, clear margins.   12/07/2018 Cancer Staging   Staging form: Breast, AJCC 7th Edition - Pathologic  stage from 12/07/2018: Stage IV (yT4b, NX, M1) - Signed by Eppie Gibson, MD on 12/07/2018   12/19/2018 - 01/30/2019 Radiation Therapy   Adjuvant radiation   05/16/2019 Miscellaneous   [facility] molecular testing: ER positive, PI K3 CA mutation present, HER-2 negative, ER positive TMB low BRCA1 not detected, ESR 1 not detected, PD-L1 negative     CHIEF COMPLIANT: Follow-up of metastatic breaston Ibrance and anastrozole  INTERVAL HISTORY: Emily Wilson is a 54 y.o. with above-mentioned history of metastatic breast cancer currently on anastrozoleand Ibrance. CT CAP on 09/19/19 showed increasing sclerosis of skeletal metastases and no new lesions. She presents to the clinic todayfora toxicity check and to review her scan.   ALLERGIES:  is allergic to naproxen, codeine, hydrocodone-acetaminophen, ibuprofen, lantus [insulin glargine], meloxicam, propoxyphene n-acetaminophen, sulfonamide derivatives, and tramadol.  MEDICATIONS:  Current Outpatient Medications  Medication Sig Dispense Refill  . Accu-Chek FastClix Lancets MISC USE TO TEST BLOOD SUGAR UP TO FOUR TIMES DAILY AS DIRECTED 204 each 3  . ACCU-CHEK GUIDE test strip USE TO CHECK BLOOD SUGAR UP TO 4 TIMES A DAY. DX: E11.65 100 strip 5  . Alpha-Lipoic Acid 200 MG CAPS Take 200 mg by mouth daily.    . BD INSULIN SYRINGE U/F 31G X 5/16" 0.5 ML MISC USE TO ADMINISTER INSULIN 3 TIMES DAILY WITH MEALS 300 each 0  . diclofenac (VOLTAREN) 75 MG EC tablet Take 75 mg by mouth 2 (two) times daily.     . ferrous sulfate 325 (65 FE) MG tablet Take 325 mg by mouth daily with breakfast.    . gabapentin (NEURONTIN) 100 MG capsule Take  3 capsules (300 mg total) by mouth 3 (three) times daily. 90 capsule 1  . IBRANCE 125 MG tablet TAKE 1 TABLET (125 MG TOTAL) BY MOUTH DAILY. TAKE FOR 21 DAYS ON, 7 DAYS OFF, REPEAT EVERY 28 DAYS. 21 tablet 3  . insulin NPH Human (HUMULIN N) 100 UNIT/ML injection Inject 0.1 mLs (10 Units total) into the skin at bedtime.  (Patient taking differently: Inject 12 Units into the skin at bedtime. ) 10 mL 11  . insulin regular (HUMULIN R) 100 units/mL injection Inject 0.1 mLs (10 Units total) into the skin 3 (three) times daily before meals. E.11.9. Hold dose for blood sugar less than 125 10 mL 11  . letrozole (FEMARA) 2.5 MG tablet TAKE 1 TABLET BY MOUTH EVERY DAY 90 tablet 3  . metoprolol succinate (TOPROL XL) 25 MG 24 hr tablet Take 1 tablet (25 mg total) by mouth daily. 90 tablet 3  . Milk Thistle 1000 MG CAPS Take 1,000 mg by mouth daily.     . mirabegron ER (MYRBETRIQ) 50 MG TB24 tablet Take 1 tablet (50 mg total) by mouth daily. 30 tablet 11  . Multiple Vitamin (MULTIVITAMIN WITH MINERALS) TABS tablet Take 1 tablet by mouth daily.    . phenazopyridine (PYRIDIUM) 100 MG tablet Take 1 tablet (100 mg total) by mouth 3 (three) times daily as needed for pain. 21 tablet 0  . pseudoephedrine (SUDAFED) 120 MG 12 hr tablet Take 1 tablet (120 mg total) by mouth every 12 (twelve) hours as needed for congestion. 30 tablet 1  . SUPER B COMPLEX/C PO Take 1 tablet by mouth daily.    . VOLTAREN 1 % GEL SMARTSIG:1 Sparingly Topical 3 Times Daily PRN     No current facility-administered medications for this visit.    PHYSICAL EXAMINATION: ECOG PERFORMANCE STATUS: 1 - Symptomatic but completely ambulatory  Vitals:   10/02/19 1149  BP: 107/82  Pulse: 93  Resp: 18  Temp: 99 F (37.2 C)  SpO2: 97%    LABORATORY DATA:  I have reviewed the data as listed CMP Latest Ref Rng & Units 09/10/2019 07/26/2019 06/28/2019  Glucose 70 - 99 mg/dL 129(H) 306(H) 272(H)  BUN 6 - 20 mg/dL '17 12 14  ' Creatinine 0.44 - 1.00 mg/dL 0.76 0.91 0.88  Sodium 135 - 145 mmol/L 139 138 140  Potassium 3.5 - 5.1 mmol/L 4.1 4.1 4.5  Chloride 98 - 111 mmol/L 105 103 106  CO2 22 - 32 mmol/L '24 22 22  ' Calcium 8.9 - 10.3 mg/dL 9.7 9.7 8.7(L)  Total Protein 6.5 - 8.1 g/dL 7.6 6.9 6.9  Total Bilirubin 0.3 - 1.2 mg/dL 0.4 0.5 0.6  Alkaline Phos 38 - 126 U/L  73 79 70  AST 15 - 41 U/L '15 19 24  ' ALT 0 - 44 U/L 19 29 33    Lab Results  Component Value Date   WBC 6.6 09/10/2019   HGB 14.8 09/10/2019   HCT 44.5 09/10/2019   MCV 102.1 (H) 09/10/2019   PLT 301 09/10/2019   NEUTROABS 4.0 09/10/2019    ASSESSMENT & PLAN:  Malignant neoplasm of lower-inner quadrant of right breast of female, estrogen receptor positive ([facility]) Right breast irregular mass lower inner quadrant retroareolar 02/19/2015: 2.2 x 1.3 x 1.7 cm with architectural distortion and skin/nipple retraction, T2 N0 stage II a clinical stage, additional benign cysts largest 1.2 cm Right breast biopsy 02/20/2015 5:00: Invasive ductal carcinoma grade 2, perineural invasion present, ER 90%, PR 70%, HER-2 negative ratio 0.95,  Ki-67 15%   Breast MRI 03/07/2015: Subareolar mass 2.2 x 2.4 x 2.5 cm extending into the retracted nipple, extending inferiorly and laterally over a distance of 4 cm, several level I right axillary lymph nodes with cortical thickening up to 7 mm with a maximum diameter of 15 mm  05/16/2019:[facility] molecular testing: ER positive, PI K3 CA mutation present, HER-2 negative, ER positive TMB low BRCA1 not detected, ESR 1 not detected, PD-L1 negative --------------------------------------------------------------------- Treatment summary:Anastrozole with Delton See started April 2020Ibrance added 09/29/2018 Bone scan: 09/20/2018: Progression of bone metastatic disease new metastases disease right lesser trochanter, mid left femoral diaphysis, additional bone mets involving left frontal, right iliac, right SI joint, right sixth rib, 12th rib, T12 transverse process and T10  Ibrancetoxicities:No adverse effectspreviously She resumedIbrance February 23, 2019  11/16/2018:Right mastectomy ([facility])performed because the tumor was fungating: IDC, grade 1, 5.2cm, grossly involving underlying skin, clear margins.  Radiation to chest wall: 12/19/18- 01/30/19 CT CAP 04/19/2019: Interval  increase in the size of the multiple lucent and sclerotic lesions involving the thoracic and lumbar spine as well as pelvis. Bone scan 06/26/2019: Stable distribution of bone metastases. CT CAP 09/19/2019: Increasing sclerosis in the bone metastases this may reflect treatment related change.  No new lesions.  Colonic diverticulosis with diverticular disease.  Plan: Continuation of current treatment Patient will see [facility] gastroenterology regarding the diverticular issues.  She will need a colonoscopy.  recheck CT scans in 6 months.  Return to clinic in 1 month for Xgeva injection and in 4 months with labs injection and follow-up with me.    No orders of the defined types were placed in this encounter.  The patient has a good understanding of the overall plan. she agrees with it. she will call with any problems that may develop before the next visit here.  Total time spent: 30 mins including face to face time and time spent for planning, charting and coordination of care  [name], MD 10/02/2019  I, Cloyde Reams Dorshimer, am acting as scribe for Dr. [name].  I have reviewed the above documentation for accuracy and completeness, and I agree with the above.

## 2019-10-02 ENCOUNTER — Telehealth: Payer: Self-pay | Admitting: Hematology and Oncology

## 2019-10-02 ENCOUNTER — Inpatient Hospital Stay: Payer: Medicaid Other | Attending: Medical | Admitting: Hematology and Oncology

## 2019-10-02 ENCOUNTER — Other Ambulatory Visit: Payer: Medicaid Other

## 2019-10-02 ENCOUNTER — Other Ambulatory Visit: Payer: Self-pay

## 2019-10-02 VITALS — BP 107/82 | HR 93 | Temp 99.0°F | Resp 18 | Ht 59.0 in | Wt 158.4 lb

## 2019-10-02 DIAGNOSIS — K5790 Diverticulosis of intestine, part unspecified, without perforation or abscess without bleeding: Secondary | ICD-10-CM | POA: Diagnosis not present

## 2019-10-02 DIAGNOSIS — C7951 Secondary malignant neoplasm of bone: Secondary | ICD-10-CM | POA: Insufficient documentation

## 2019-10-02 DIAGNOSIS — Z923 Personal history of irradiation: Secondary | ICD-10-CM | POA: Insufficient documentation

## 2019-10-02 DIAGNOSIS — Z170 Estrogen receptor positive status [ER+]: Secondary | ICD-10-CM | POA: Insufficient documentation

## 2019-10-02 DIAGNOSIS — Z9011 Acquired absence of right breast and nipple: Secondary | ICD-10-CM | POA: Diagnosis not present

## 2019-10-02 DIAGNOSIS — C50311 Malignant neoplasm of lower-inner quadrant of right female breast: Secondary | ICD-10-CM | POA: Diagnosis not present

## 2019-10-02 NOTE — Assessment & Plan Note (Signed)
Right breast irregular mass lower inner quadrant retroareolar 02/19/2015: 2.2 x 1.3 x 1.7 cm with architectural distortion and skin/nipple retraction, T2 N0 stage II a clinical stage, additional benign cysts largest 1.2 cm Right breast biopsy 02/20/2015 5:00: Invasive ductal carcinoma grade 2, perineural invasion present, ER 90%, PR 70%, HER-2 negative ratio 0.95, Ki-67 15%   Breast MRI 03/07/2015: Subareolar mass 2.2 x 2.4 x 2.5 cm extending into the retracted nipple, extending inferiorly and laterally over a distance of 4 cm, several level I right axillary lymph nodes with cortical thickening up to 7 mm with a maximum diameter of 15 mm  05/16/2019:[facility] molecular testing: ER positive, PI K3 CA mutation present, HER-2 negative, ER positive TMB low BRCA1 not detected, ESR 1 not detected, PD-L1 negative --------------------------------------------------------------------- Treatment summary:Anastrozole with Delton See started April 2020Ibrance added 09/29/2018 Bone scan: 09/20/2018: Progression of bone metastatic disease new metastases disease right lesser trochanter, mid left femoral diaphysis, additional bone mets involving left frontal, right iliac, right SI joint, right sixth rib, 12th rib, T12 transverse process and T10  Ibrancetoxicities:No adverse effectspreviously She resumedIbrance February 23, 2019  11/16/2018:Right mastectomy ([place])performed because the tumor was fungating: IDC, grade 1, 5.2cm, grossly involving underlying skin, clear margins.  Radiation to chest wall: 12/19/18- 01/30/19 CT CAP 04/19/2019: Interval increase in the size of the multiple lucent and sclerotic lesions involving the thoracic and lumbar spine as well as pelvis. Bone scan 06/26/2019: Stable distribution of bone metastases. CT CAP 09/19/2019: Increasing sclerosis in the bone metastases this may reflect treatment related change.  No new lesions.  Colonic diverticulosis with diverticular disease.  Plan:  Continuation of current treatment And recheck CT scans in 6 months.  Return to clinic

## 2019-10-02 NOTE — Telephone Encounter (Signed)
Scheduled appts per 8/10 los. Gave pt a print out of AVS. Sent referral to [facility]

## 2019-10-08 MED FILL — IBRANCE 125 MG TABS: 125 | 28 days supply | Qty: 21 | Fill #3

## 2019-10-09 ENCOUNTER — Telehealth: Payer: Self-pay | Admitting: Hematology and Oncology

## 2019-10-09 NOTE — Telephone Encounter (Signed)
JMEQAST:[id number] Faxed medical records to [facility] for new pt referral @ fax [phone]

## 2019-10-10 ENCOUNTER — Encounter: Payer: Self-pay | Admitting: Internal Medicine

## 2019-10-10 ENCOUNTER — Telehealth (INDEPENDENT_AMBULATORY_CARE_PROVIDER_SITE_OTHER): Payer: Medicaid Other | Admitting: Internal Medicine

## 2019-10-10 ENCOUNTER — Other Ambulatory Visit: Payer: Self-pay

## 2019-10-10 DIAGNOSIS — I10 Essential (primary) hypertension: Secondary | ICD-10-CM | POA: Diagnosis not present

## 2019-10-10 DIAGNOSIS — Z1159 Encounter for screening for other viral diseases: Secondary | ICD-10-CM

## 2019-10-10 DIAGNOSIS — E1159 Type 2 diabetes mellitus with other circulatory complications: Secondary | ICD-10-CM | POA: Diagnosis not present

## 2019-10-10 DIAGNOSIS — E782 Mixed hyperlipidemia: Secondary | ICD-10-CM

## 2019-10-10 DIAGNOSIS — Z794 Long term (current) use of insulin: Secondary | ICD-10-CM

## 2019-10-10 NOTE — Progress Notes (Signed)
Virtual Visit via Telephone Note  I connected with Emily Wilson, on 10/10/2019 at 10:06 AM by telephone due to the COVID-19 pandemic and verified that I am speaking with the correct person using two identifiers.   Consent: I discussed the limitations, risks, security and privacy concerns of performing an evaluation and management service by telephone and the availability of in person appointments. I also discussed with the patient that there may be a patient responsible charge related to this service. The patient expressed understanding and agreed to proceed.   Location of Patient: Home   Location of Provider: Clinic    Persons participating in Telemedicine visit: Emily Wilson [facility] Dr. Juleen China    History of Present Illness: Patient has a visit to follow up on chronic medical conditions.   Diabetes mellitus, Type 2 Disease Monitoring             Blood Sugar Ranges: Fasting - 140s              Polyuria: no             Visual problems: no   Urine Microalbumin 3 (Feb 2021)   Last A1C: 7.7 (May 2021)   Medications:  Insulin NPH 10 u nightly, Insulin Humulin 12u with meals (patient has doses switched so we discussed correcting)  Medication Compliance: yes--sometimes skips dose of Humulin when skips a meal  Medication Side Effects             Hypoglycemia: no    Past Medical History:  Diagnosis Date  . Anxiety state, unspecified   . Arthritis   . Breast cancer of lower-inner quadrant of right female breast ([place])   . Carpal tunnel syndrome   . Complication of anesthesia    woke up during ganglion cyst removal in her 20's, not woken up since  . Diabetes mellitus without complication ([place])    Type II  . Heart murmur   . History of radiation therapy 12/18/18- 01/30/19   Right Chest wall 25 fractions X 2Gy each to total 50 Gy, followed by a boost 10 Gy in 5 fractions.   . Hyperlipidemia   . Hyperthyroidism   . Mitral valve prolapse   . Smoker   .  Tachycardia   . 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 Naproxen Shortness Of Breath  . Codeine Hives  . Hydrocodone-Acetaminophen Hives  . Ibuprofen     Irritant to stomach r/t diverticulitis  . Lantus [Insulin Glargine]     Yeast Infections  . Meloxicam Other (See Comments)    Abdominal pain   . Propoxyphene N-Acetaminophen Hives  . Sulfonamide Derivatives Hives  . Tramadol Other (See Comments)    Abdominal Pain    Current Outpatient Medications on File Prior to Visit  Medication Sig Dispense Refill  . Accu-Chek FastClix Lancets MISC USE TO TEST BLOOD SUGAR UP TO FOUR TIMES DAILY AS DIRECTED 204 each 3  . ACCU-CHEK GUIDE test strip USE TO CHECK BLOOD SUGAR UP TO 4 TIMES A DAY. DX: E11.65 100 strip 5  . Alpha-Lipoic Acid 200 MG CAPS Take 200 mg by mouth daily.    . BD INSULIN SYRINGE U/F 31G X 5/16" 0.5 ML MISC USE TO ADMINISTER INSULIN 3 TIMES DAILY WITH MEALS 300 each 0  . diclofenac (VOLTAREN) 75 MG EC tablet Take 75 mg by mouth 2 (two) times daily.     Marland Kitchen  ferrous sulfate 325 (65 FE) MG tablet Take 325 mg by mouth daily with breakfast.    . gabapentin (NEURONTIN) 100 MG capsule Take 3 capsules (300 mg total) by mouth 3 (three) times daily. 90 capsule 1  . IBRANCE 125 MG tablet TAKE 1 TABLET (125 MG TOTAL) BY MOUTH DAILY. TAKE FOR 21 DAYS ON, 7 DAYS OFF, REPEAT EVERY 28 DAYS. 21 tablet 3  . insulin NPH Human (HUMULIN N) 100 UNIT/ML injection Inject 0.1 mLs (10 Units total) into the skin at bedtime. (Patient taking differently: Inject 12 Units into the skin at bedtime. ) 10 mL 11  . insulin regular (HUMULIN R) 100 units/mL injection Inject 0.1 mLs (10 Units total) into the skin 3 (three) times daily before meals. E.11.9. Hold dose for blood sugar less than 125 10 mL 11  . letrozole (FEMARA) 2.5 MG tablet TAKE 1 TABLET BY MOUTH EVERY DAY 90 tablet 3  . metoprolol succinate (TOPROL XL)  25 MG 24 hr tablet Take 1 tablet (25 mg total) by mouth daily. 90 tablet 3  . Milk Thistle 1000 MG CAPS Take 1,000 mg by mouth daily.     . mirabegron ER (MYRBETRIQ) 50 MG TB24 tablet Take 1 tablet (50 mg total) by mouth daily. 30 tablet 11  . Multiple Vitamin (MULTIVITAMIN WITH MINERALS) TABS tablet Take 1 tablet by mouth daily.    . SUPER B COMPLEX/C PO Take 1 tablet by mouth daily.    . Vitamin D, Ergocalciferol, (DRISDOL) 1.25 MG (50000 UNIT) CAPS capsule Take 50,000 Units by mouth once a week.    . VOLTAREN 1 % GEL SMARTSIG:1 Sparingly Topical 3 Times Daily PRN     No current facility-administered medications on file prior to visit.    Observations/Objective: NAD. Speaking clearly.  Work of breathing normal.  Alert and oriented. Mood appropriate.   Assessment and Plan: 1. Type 2 diabetes mellitus with other circulatory complication, with long-term current use of insulin (HCC) Last A1c close to goal and fasting CBGs fairly well controlled. Discussed appropriate dosing of insulin. Return for A1c.  Counseled on Diabetic diet, my plate method, 614 minutes of moderate intensity exercise/week Blood sugar logs with fasting goals of 80-120 mg/dl, random of less than 180 and in the event of sugars less than 60 mg/dl or greater than 400 mg/dl encouraged to notify the clinic. Advised on the need for annual eye exams, annual foot exams, Pneumonia vaccine. - Hemoglobin A1c; Future  2. Essential hypertension Does monitor at home but can't recall her numbers. Asymptomatic. Continue metoprolol and follow up at next visit.   3. Mixed hyperlipidemia Last LDL 71. Unable to take statin with Ibrance. Continue diet and exercise control.   4. Need for hepatitis C screening test - HCV Ab w/Rflx to Verification; Future   Follow Up Instructions: Lab visit 8/19    I discussed the assessment and treatment plan with the patient. The patient was provided an opportunity to ask questions and all were  answered. The patient agreed with the plan and demonstrated an understanding of the instructions.   The patient was advised to call back or seek an in-person evaluation if the symptoms worsen or if the condition fails to improve as anticipated.     I provided 22 minutes total of non-face-to-face time during this encounter including median intraservice time, reviewing previous notes, investigations, ordering medications, medical decision making, coordinating care and patient verbalized understanding at the end of the visit.    Phill Myron, D.O. Primary  Care at [facility]  10/10/2019, 10:06 AM

## 2019-10-11 ENCOUNTER — Other Ambulatory Visit (INDEPENDENT_AMBULATORY_CARE_PROVIDER_SITE_OTHER): Payer: Medicaid Other

## 2019-10-11 DIAGNOSIS — Z794 Long term (current) use of insulin: Secondary | ICD-10-CM

## 2019-10-11 DIAGNOSIS — E1159 Type 2 diabetes mellitus with other circulatory complications: Secondary | ICD-10-CM

## 2019-10-11 DIAGNOSIS — Z1159 Encounter for screening for other viral diseases: Secondary | ICD-10-CM

## 2019-10-12 LAB — HCV INTERPRETATION

## 2019-10-12 LAB — HCV AB W/RFLX TO VERIFICATION: HCV Ab: <0.1 s/co ratio (ref 0.0–0.9)

## 2019-10-12 LAB — HEMOGLOBIN A1C
Est. average glucose Bld gHb Est-mCnc: 160 mg/dL
Hgb A1c MFr Bld: 7.2 % — ABNORMAL HIGH (ref 4.8–5.6)

## 2019-10-15 ENCOUNTER — Other Ambulatory Visit: Payer: Self-pay | Admitting: Urology

## 2019-10-16 NOTE — Progress Notes (Signed)
Normal lab letter mailed.

## 2019-10-17 ENCOUNTER — Telehealth: Payer: Self-pay | Admitting: Internal Medicine

## 2019-10-17 NOTE — Telephone Encounter (Signed)
Patient called in to inform the referral coordinator that the place she was sent to for oncology was out of network. Patient stated that she wanted to place her referral on hold due to her not having the issue at the moment. Patient stated that she would call in once she needed that referral placed.

## 2019-10-31 ENCOUNTER — Other Ambulatory Visit: Payer: Self-pay

## 2019-10-31 ENCOUNTER — Telehealth: Payer: Self-pay | Admitting: Radiology

## 2019-10-31 ENCOUNTER — Other Ambulatory Visit: Payer: Self-pay | Admitting: Hematology and Oncology

## 2019-10-31 DIAGNOSIS — C50311 Malignant neoplasm of lower-inner quadrant of right female breast: Secondary | ICD-10-CM

## 2019-10-31 MED ORDER — "BD INSULIN SYRINGE U/F 31G X 5/16"" 0.5 ML MISC": 2 refills | Status: DC

## 2019-10-31 NOTE — Telephone Encounter (Signed)
thanks

## 2019-10-31 NOTE — Telephone Encounter (Signed)
Patient wishes to defer cystoscopy until Covid numbers decrease. Advised patient to call back when ready to reschedule or sooner if needed. Patient expressed understanding.

## 2019-11-01 ENCOUNTER — Other Ambulatory Visit: Payer: Self-pay | Admitting: Hematology and Oncology

## 2019-11-02 ENCOUNTER — Inpatient Hospital Stay: Payer: Medicaid Other

## 2019-11-02 ENCOUNTER — Other Ambulatory Visit: Payer: Self-pay

## 2019-11-02 ENCOUNTER — Inpatient Hospital Stay: Payer: Medicaid Other | Attending: Medical

## 2019-11-02 ENCOUNTER — Other Ambulatory Visit: Payer: Self-pay | Admitting: *Deleted

## 2019-11-02 VITALS — BP 117/89 | HR 70 | Temp 98.3°F | Resp 18

## 2019-11-02 DIAGNOSIS — C50311 Malignant neoplasm of lower-inner quadrant of right female breast: Secondary | ICD-10-CM

## 2019-11-02 DIAGNOSIS — C7951 Secondary malignant neoplasm of bone: Secondary | ICD-10-CM | POA: Insufficient documentation

## 2019-11-02 DIAGNOSIS — Z170 Estrogen receptor positive status [ER+]: Secondary | ICD-10-CM | POA: Insufficient documentation

## 2019-11-02 DIAGNOSIS — Z923 Personal history of irradiation: Secondary | ICD-10-CM | POA: Diagnosis not present

## 2019-11-02 DIAGNOSIS — Z9011 Acquired absence of right breast and nipple: Secondary | ICD-10-CM | POA: Diagnosis not present

## 2019-11-02 LAB — CBC WITH DIFFERENTIAL (CANCER CENTER ONLY)
Abs Immature Granulocytes: 0.01 10*3/uL (ref 0.00–0.07)
Basophils Absolute: 0.1 10*3/uL (ref 0.0–0.1)
Basophils Relative: 1 %
Eosinophils Absolute: 0.2 10*3/uL (ref 0.0–0.5)
Eosinophils Relative: 4 %
HCT: 44.7 % (ref 36.0–46.0)
Hemoglobin: 15.1 g/dL — ABNORMAL HIGH (ref 12.0–15.0)
Immature Granulocytes: 0 %
Lymphocytes Relative: 25 %
Lymphs Abs: 1.4 10*3/uL (ref 0.7–4.0)
MCH: 32.2 pg (ref 26.0–34.0)
MCHC: 33.8 g/dL (ref 30.0–36.0)
MCV: 95.3 fL (ref 80.0–100.0)
Monocytes Absolute: 0.7 10*3/uL (ref 0.1–1.0)
Monocytes Relative: 13 %
Neutro Abs: 3.4 10*3/uL (ref 1.7–7.7)
Neutrophils Relative %: 57 %
Platelet Count: 292 10*3/uL (ref 150–400)
RBC: 4.69 MIL/uL (ref 3.87–5.11)
RDW: 11.8 % (ref 11.5–15.5)
WBC Count: 5.8 10*3/uL (ref 4.0–10.5)
nRBC: 0 % (ref 0.0–0.2)

## 2019-11-02 LAB — CMP (CANCER CENTER ONLY)
ALT: 29 U/L (ref 0–44)
AST: 25 U/L (ref 15–41)
Albumin: 3.8 g/dL (ref 3.5–5.0)
Alkaline Phosphatase: 77 U/L (ref 38–126)
Anion gap: 9 (ref 5–15)
BUN: 13 mg/dL (ref 6–20)
CO2: 24 mmol/L (ref 22–32)
Calcium: 9.9 mg/dL (ref 8.9–10.3)
Chloride: 104 mmol/L (ref 98–111)
Creatinine: 0.75 mg/dL (ref 0.44–1.00)
GFR, Est AFR Am: >60 mL/min (ref >60)
GFR, Estimated: >60 mL/min (ref >60)
Glucose, Bld: 150 mg/dL — ABNORMAL HIGH (ref 70–99)
Potassium: 4.2 mmol/L (ref 3.5–5.1)
Sodium: 137 mmol/L (ref 135–145)
Total Bilirubin: 0.5 mg/dL (ref 0.3–1.2)
Total Protein: 7.4 g/dL (ref 6.5–8.1)

## 2019-11-02 MED ORDER — DENOSUMAB 120 MG/1.7ML ~~LOC~~ SOLN
120.0000 mg | Freq: Once | SUBCUTANEOUS | Status: AC
  Administered 2019-11-02: 120 mg via SUBCUTANEOUS

## 2019-11-02 MED ORDER — DENOSUMAB 120 MG/1.7ML ~~LOC~~ SOLN
SUBCUTANEOUS | Status: AC
  Filled 2019-11-02: qty 1.7

## 2019-11-02 NOTE — Patient Instructions (Signed)
Denosumab injection What is this medicine? DENOSUMAB (den oh sue mab) slows bone breakdown. Prolia is used to treat osteoporosis in women after menopause and in men, and in people who are taking corticosteroids for 6 months or more. Xgeva is used to treat a high calcium level due to cancer and to prevent bone fractures and other bone problems caused by multiple myeloma or cancer bone metastases. Xgeva is also used to treat giant cell tumor of the bone. This medicine may be used for other purposes; ask your health care provider or pharmacist if you have questions. COMMON BRAND NAME(S): Prolia, XGEVA What should I tell my health care provider before I take this medicine? They need to know if you have any of these conditions:  dental disease  having surgery or tooth extraction  infection  kidney disease  low levels of calcium or Vitamin D in the blood  malnutrition  on hemodialysis  skin conditions or sensitivity  thyroid or parathyroid disease  an unusual reaction to denosumab, other medicines, foods, dyes, or preservatives  pregnant or trying to get pregnant  breast-feeding How should I use this medicine? This medicine is for injection under the skin. It is given by a health care professional in a hospital or clinic setting. A special MedGuide will be given to you before each treatment. Be sure to read this information carefully each time. For Prolia, talk to your pediatrician regarding the use of this medicine in children. Special care may be needed. For Xgeva, talk to your pediatrician regarding the use of this medicine in children. While this drug may be prescribed for children as young as 13 years for selected conditions, precautions do apply. Overdosage: If you think you have taken too much of this medicine contact a poison control center or emergency room at once. NOTE: This medicine is only for you. Do not share this medicine with others. What if I miss a dose? It is  important not to miss your dose. Call your doctor or health care professional if you are unable to keep an appointment. What may interact with this medicine? Do not take this medicine with any of the following medications:  other medicines containing denosumab This medicine may also interact with the following medications:  medicines that lower your chance of fighting infection  steroid medicines like prednisone or cortisone This list may not describe all possible interactions. Give your health care provider a list of all the medicines, herbs, non-prescription drugs, or dietary supplements you use. Also tell them if you smoke, drink alcohol, or use illegal drugs. Some items may interact with your medicine. What should I watch for while using this medicine? Visit your doctor or health care professional for regular checks on your progress. Your doctor or health care professional may order blood tests and other tests to see how you are doing. Call your doctor or health care professional for advice if you get a fever, chills or sore throat, or other symptoms of a cold or flu. Do not treat yourself. This drug may decrease your body's ability to fight infection. Try to avoid being around people who are sick. You should make sure you get enough calcium and vitamin D while you are taking this medicine, unless your doctor tells you not to. Discuss the foods you eat and the vitamins you take with your health care professional. See your dentist regularly. Brush and floss your teeth as directed. Before you have any dental work done, tell your dentist you are   receiving this medicine. Do not become pregnant while taking this medicine or for 5 months after stopping it. Talk with your doctor or health care professional about your birth control options while taking this medicine. Women should inform their doctor if they wish to become pregnant or think they might be pregnant. There is a potential for serious side  effects to an unborn child. Talk to your health care professional or pharmacist for more information. What side effects may I notice from receiving this medicine? Side effects that you should report to your doctor or health care professional as soon as possible:  allergic reactions like skin rash, itching or hives, swelling of the face, lips, or tongue  bone pain  breathing problems  dizziness  jaw pain, especially after dental work  redness, blistering, peeling of the skin  signs and symptoms of infection like fever or chills; cough; sore throat; pain or trouble passing urine  signs of low calcium like fast heartbeat, muscle cramps or muscle pain; pain, tingling, numbness in the hands or feet; seizures  unusual bleeding or bruising  unusually weak or tired Side effects that usually do not require medical attention (report to your doctor or health care professional if they continue or are bothersome):  constipation  diarrhea  headache  joint pain  loss of appetite  muscle pain  runny nose  tiredness  upset stomach This list may not describe all possible side effects. Call your doctor for medical advice about side effects. You may report side effects to FDA at 1-800-FDA-1088. Where should I keep my medicine? This medicine is only given in a clinic, doctor's office, or other health care setting and will not be stored at home. NOTE: This sheet is a summary. It may not cover all possible information. If you have questions about this medicine, talk to your doctor, pharmacist, or health care provider.  2020 Elsevier/Gold Standard (2017-06-17 16:10:44)

## 2019-11-05 ENCOUNTER — Other Ambulatory Visit: Payer: Self-pay | Admitting: Urology

## 2019-11-05 MED FILL — IBRANCE 125 MG TABS: 125 | 28 days supply | Qty: 21 | Fill #0

## 2019-12-03 DIAGNOSIS — R933 Abnormal findings on diagnostic imaging of other parts of digestive tract: Secondary | ICD-10-CM | POA: Diagnosis not present

## 2019-12-03 DIAGNOSIS — K5901 Slow transit constipation: Secondary | ICD-10-CM | POA: Diagnosis not present

## 2019-12-03 DIAGNOSIS — K5730 Diverticulosis of large intestine without perforation or abscess without bleeding: Secondary | ICD-10-CM | POA: Diagnosis not present

## 2019-12-03 MED FILL — IBRANCE 125 MG TABS: 125 | 28 days supply | Qty: 21 | Fill #1

## 2019-12-19 ENCOUNTER — Other Ambulatory Visit: Payer: Self-pay | Admitting: *Deleted

## 2019-12-19 NOTE — Patient Instructions (Signed)
Visit Information  Ms. Lizeth L Minahan  - as a part of your Medicaid benefit, you are eligible for care management and care coordination services at no cost or copay. I was unable to reach you by phone today but would be happy to help you with your health related needs. Please feel free to call me @ [phone].   A member of the Managed Medicaid care management team will reach out to you again over the next 7-14 days.   Riko Lumsden RN, BSN [place]  [facility] RN Care Coordinator   

## 2019-12-19 NOTE — Patient Outreach (Signed)
Care Coordination  12/19/2019  Emily Wilson 15-Mar-1965 [id number]   An unsuccessful telephone outreach was attempted today. The patient was referred to the case management team for assistance with care management and care coordination.   Follow Up Plan: A HIPAA compliant phone message was left for the patient providing contact information and requesting a return call.  The Managed Medicaid care management team will reach out to the patient again over the next 7-14 days.   Lurena Joiner RN, BSN [place]  [employer] manager

## 2019-12-25 ENCOUNTER — Encounter: Payer: Self-pay | Admitting: Internal Medicine

## 2019-12-25 ENCOUNTER — Ambulatory Visit (INDEPENDENT_AMBULATORY_CARE_PROVIDER_SITE_OTHER): Payer: Medicaid Other | Admitting: Internal Medicine

## 2019-12-25 ENCOUNTER — Other Ambulatory Visit: Payer: Self-pay

## 2019-12-25 VITALS — BP 117/72 | HR 73 | Temp 98.2°F | Resp 17 | Wt 148.0 lb

## 2019-12-25 DIAGNOSIS — E782 Mixed hyperlipidemia: Secondary | ICD-10-CM | POA: Diagnosis not present

## 2019-12-25 DIAGNOSIS — Z794 Long term (current) use of insulin: Secondary | ICD-10-CM

## 2019-12-25 DIAGNOSIS — L02415 Cutaneous abscess of right lower limb: Secondary | ICD-10-CM | POA: Diagnosis not present

## 2019-12-25 DIAGNOSIS — E1159 Type 2 diabetes mellitus with other circulatory complications: Secondary | ICD-10-CM

## 2019-12-25 DIAGNOSIS — I10 Essential (primary) hypertension: Secondary | ICD-10-CM

## 2019-12-25 LAB — POCT GLYCOSYLATED HEMOGLOBIN (HGB A1C): Hemoglobin A1C: 7.2 % — AB (ref 4.0–5.6)

## 2019-12-25 LAB — GLUCOSE, POCT (MANUAL RESULT ENTRY): POC Glucose: 197 mg/dl — AB (ref 70–99)

## 2019-12-25 MED ORDER — FLUCONAZOLE 150 MG PO TABS: 150.0000 mg | ORAL_TABLET | Freq: Once | ORAL | 0 refills | Status: AC

## 2019-12-25 MED ORDER — DOXYCYCLINE HYCLATE 100 MG PO TABS: 100.0000 mg | ORAL_TABLET | Freq: Two times a day (BID) | ORAL | 0 refills | Status: DC

## 2019-12-25 NOTE — Progress Notes (Signed)
Subjective:    Emily Wilson - 54 y.o. female MRN [id number]  Date of birth: May 08, 1965  HPI  Emily Wilson is here for follow up of chronic medical conditions.  Diabetes mellitus, Type 2 Disease Monitoring             Blood Sugar Ranges: Fasting - 100s             Polyuria: np             Visual problems: no   Urine Microalbumin 3 (Feb 2021)   Last A1C: 7.2 (Aug 2021)   Medications: Insulin NPH 12u qhs, Insulin regular 10u TID  Medication Compliance: yes  Medication Side Effects             Hypoglycemia: no    Chronic HTN Disease Monitoring:  Home BP Monitoring - Monitors infrequently. Controlled at home.  Chest pain- no  Dyspnea- no Headache - no  Medications: Metoprolol XL 25 mg  Compliance- yes Lightheadedness- no  Edema- no        Health Maintenance:  Health Maintenance Due  Topic Date Due  . COVID-19 Vaccine (1) Never done  . OPHTHALMOLOGY EXAM  10/29/2016    -  reports that she quit smoking about 8 years ago. Her smoking use included cigarettes. She has a 90.00 pack-year smoking history. She has never used smokeless tobacco. - Review of Systems: Per HPI. - Past Medical History: Patient Active Problem List   Diagnosis Date Noted  . History of shingles 01/09/2019  . S/P mastectomy, right 11/16/2018  . Bone metastasis ([place]) 05/31/2018  . Family history of breast cancer in female 03/13/2015  . Malignant neoplasm of lower-inner quadrant of right breast of female, estrogen receptor positive ([place]) 02/25/2015  . DM (diabetes mellitus), type 2 ([place]) 02/25/2015  . HLD (hyperlipidemia) 02/25/2015  . Palpitations 01/26/2010  . Anxiety state 05/26/2009  . Hyperthyroidism 11/14/2006  . Hypertension 11/14/2006   - Medications: reviewed and updated   Objective:   Physical Exam BP 117/72   Pulse 73   Temp 98.2 F (36.8 C) (Temporal)   Resp 17   Wt 148 lb (67.1 kg)   SpO2 96%   BMI 29.89 kg/m  Physical Exam Constitutional:       General: She is not in acute distress.    Appearance: She is not diaphoretic.  Cardiovascular:     Rate and Rhythm: Normal rate.  Pulmonary:     Effort: Pulmonary effort is normal. No respiratory distress.  Musculoskeletal:        General: Normal range of motion.  Skin:    General: Skin is warm and dry.     Comments: On right inner thigh approximately 1 cm area of fluctuance with surrounding induration extending about 2.5 cm in diameter. Erythematous and TTP.   Neurological:     Mental Status: She is alert and oriented to person, place, and time.  Psychiatric:        Mood and Affect: Affect normal.        Judgment: Judgment normal.        INCISION AND DRAINAGE OF Right Inner Thigh Cutaneous Abscess   A timeout protocol was performed prior to initiating the procedure. The area was prepared and draped in the usual, sterile manner. The site was anesthetized with 105ml of 1% lidocaine with epinephrine. A linear stab incision along the local skin lines was made and pustular and bloody fluid was expressed. The abcess was probed with hemostat and  sequestered pockets were opened. Bleeding was minimal.   The patient tolerated the procedure well without complications.  Standard post-procedure care is explained and return precautions are given.    Assessment & Plan:   1. Type 2 diabetes mellitus with other circulatory complication, with long-term current use of insulin (HCC) A1c 7.2. Glycemic control stable. Continue current regimen.  Counseled on Diabetic diet, my plate method, 701 minutes of moderate intensity exercise/week Blood sugar logs with fasting goals of 80-120 mg/dl, random of less than 180 and in the event of sugars less than 60 mg/dl or greater than 400 mg/dl encouraged to notify the clinic. Advised on the need for annual eye exams, annual foot exams, Pneumonia vaccine. - Glucose (CBG) - HgB A1c - HM Diabetes Foot Exam  2. Essential hypertension BP at goal. Continue current  regimen.   3. Mixed hyperlipidemia Last LDL 71. Unable to take statin with Ibrance. Continue diet and exercise control.   4. Cutaneous abscess of right lower extremity I&D performed and was able to express some pustular drainage. Discussed some areas of induration remain. Instructed to use warm compress to area. Will prescribe Doxycyline given some overlying erythema and concern for mild developing cellulitis.  - doxycycline (VIBRA-TABS) 100 MG tablet; Take 1 tablet (100 mg total) by mouth 2 (two) times daily.  Dispense: 14 tablet; Refill: 0     Phill Myron, D.O. 12/25/2019, 10:53 AM Primary Care at [facility]

## 2019-12-26 ENCOUNTER — Telehealth: Payer: Self-pay

## 2019-12-26 NOTE — Telephone Encounter (Signed)
Pt took doxycycline and had an allergic reaction to it. Has hives all over.

## 2019-12-26 NOTE — Telephone Encounter (Signed)
Rashes are extremely rare from Doxycyline unless related to sun exposure with use of medication. However, would recommend discontinuing antibiotic for presumed allergic reaction. I can send in another antibiotic or she can monitor the area and antibiotic would be indicated if she were to have redness of the surrounding skin, increased warmth to the surrounding skin, fevers.   Phill Myron, D.O. Primary Care at [facility]  12/26/2019, 3:21 PM

## 2019-12-26 NOTE — Telephone Encounter (Signed)
Pt would like something else to be sent.

## 2019-12-27 ENCOUNTER — Telehealth: Payer: Self-pay

## 2019-12-27 ENCOUNTER — Other Ambulatory Visit: Payer: Self-pay | Admitting: Internal Medicine

## 2019-12-27 ENCOUNTER — Encounter: Payer: Self-pay | Admitting: *Deleted

## 2019-12-27 MED ORDER — CLINDAMYCIN HCL 300 MG PO CAPS: 300.0000 mg | ORAL_CAPSULE | Freq: Three times a day (TID) | ORAL | 0 refills | Status: DC

## 2019-12-27 NOTE — Telephone Encounter (Signed)
Pt came in and that she would like it documented that she is allergic to all the medications with sulfate

## 2019-12-27 NOTE — Telephone Encounter (Signed)
Allergies were added to list. New antibiotic was sent.   Phill Myron, D.O. Primary Care at [facility]  12/27/2019, 1:58 PM

## 2019-12-31 MED FILL — IBRANCE 125 MG TABS: 125 | 28 days supply | Qty: 21 | Fill #2

## 2020-01-03 ENCOUNTER — Ambulatory Visit: Payer: Self-pay

## 2020-01-07 ENCOUNTER — Other Ambulatory Visit: Payer: Self-pay | Admitting: *Deleted

## 2020-01-07 NOTE — Patient Instructions (Signed)
Visit Information  [name]  - as a part of your Medicaid benefit, you are eligible for care management and care coordination services at no cost or copay. I was unable to reach you by phone today but would be happy to help you with your health related needs. Please feel free to call me @ [phone].   A member of the Managed Medicaid care management team will reach out to you again over the next 7 days.   Lurena Joiner RN, BSN [place]  [employer] manager

## 2020-01-07 NOTE — Patient Outreach (Signed)
Care Coordination  01/07/2020  Emily Wilson 1965-10-12 [id number]   A second unsuccessful telephone outreach was attempted today. The patient was referred to the case management team for assistance with care management and care coordination.   Follow Up Plan: A HIPAA compliant phone message was left for the patient providing contact information and requesting a return call.  The Managed Medicaid care management team will reach out to the patient again over the next 7 days.   Lurena Joiner RN, BSN [place]  [employer] manager

## 2020-01-16 ENCOUNTER — Other Ambulatory Visit: Payer: Self-pay

## 2020-01-16 ENCOUNTER — Other Ambulatory Visit: Payer: Self-pay | Admitting: *Deleted

## 2020-01-16 NOTE — Patient Instructions (Signed)
Visit Information  Ms. Barrientes was given information about Medicaid Managed Care team care coordination services as a part of their [employer] Medicaid benefit. [name] verbally consented to engagement with the [facility] Managed Care team.   For questions related to your Healthy [facility] health plan, please call: [phone] or visit the homepage here: [email or web address]  If you would like to schedule transportation through your Healthy [facility] plan, please call the following number at least 2 days in advance of your appointment: [phone]   Patient Care Plan: Diabetes Management    Problem Identified: Glycemic Management (Diabetes, Type 2)     Long-Range Goal: Glycemic Management Optimized   Start Date: 01/16/2020  Expected End Date: 03/19/2020  This Visit's Progress: On track  Priority: High  Note:    Current Barriers:  . Chronic Disease Management support and education needs related to Diabetes  Nurse Case Manager Clinical Goal(s):  Marland Kitchen Over the next 60 days, patient will meet with RN Care Manager to address barriers to managing medical care . Over the next 60 days, the patient will demonstrate ongoing self health care management ability as evidenced by improving A1C  Interventions:  . Inter-disciplinary care team collaboration (see longitudinal plan of care) . Evaluation of current treatment plan related to Diabetes and patient's adherence to plan as established by provider. . Discussed plans with patient for ongoing care management follow up and provided patient with direct contact information for care management team . Reviewed scheduled/upcoming provider appointments  . Advised patient, providing education and rationale, to check cbg as directed by Dr. Juleen China and record, calling Dr. Juleen China for findings outside established parameters.    Patient Goals/Self-Care Activities Over the next 60 days, patient will: - Self  administers medications as prescribed - Attends all scheduled provider appointments - Calls pharmacy for medication refills - Calls provider office for new concerns or questions - check blood sugar at prescribed times - check blood sugar if I feel it is too high or too low - enter blood sugar readings and medication or insulin into daily log - take the blood sugar log to all doctor visits - take the blood sugar meter to all doctor visits -continue your diet of plant based food choices, limiting carbohydrates and sugars  Follow Up Plan: Telephone follow up appointment with Managed Medicaid care management team member scheduled for:03/19/20 @ 10:30am       Telephone follow up appointment with Managed Medicaid care management team member scheduled for:03/19/20 @ 10:30am  Lurena Joiner RN, BSN [place] RN Care Coordinator

## 2020-01-16 NOTE — Patient Outreach (Cosign Needed)
Care Coordination - Case Manager  01/16/2020  [name] 15-Jun-1965 [id number]  Subjective:  Emily Wilson is an 54 y.o. year old female who is a primary patient of Emily Bang, DO.  [name] was given information about Medicaid Managed Care team care coordination services today. Luiz Wilson agreed to services and verbal consent obtained  Review of patient status, laboratory and other test data was performed as part of evaluation for provision of services.  SDOH: SDOH Screenings   Alcohol Screen:   . Last Alcohol Screening Score (AUDIT): Not on file  Depression (PHQ2-9): Low Risk   . PHQ-2 Score: 0  Stress:   . Feeling of Stress : Not on file  Tobacco Use: Medium Risk  . Smoking Tobacco Use: Former Smoker  . Smokeless Tobacco Use: Never Used  Transportation Needs:   . Film/video editor (Medical): Not on file  . Lack of Transportation (Non-Medical): Not on file     Objective:    Allergies  Allergen Reactions  . Doxycycline Shortness Of Breath    Wheezing, shortness of breath, rash head to toe, and swelling  . Naproxen Shortness Of Breath  . Codeine Hives  . Hydrocodone-Acetaminophen Hives  . Ibuprofen     Irritant to stomach r/t diverticulitis  . Lantus [Insulin Glargine]     Yeast Infections  . Meloxicam Other (See Comments)    Abdominal pain   . Propoxyphene N-Acetaminophen Hives  . Sulfonamide Derivatives Hives  . Tramadol Other (See Comments)    Abdominal Pain    Medications:    Medications Reviewed Today    Reviewed by [name], RN (Registered Nurse) on 01/16/20 at [place] List Status: <None>  Medication Order Taking? Sig Documenting Provider Last Dose Status Informant  Accu-Chek FastClix Lancets MISC [id number] Yes USE TO TEST BLOOD SUGAR UP TO FOUR TIMES DAILY AS DIRECTED Emily Bang, DO Taking Active   ACCU-CHEK GUIDE test strip [id number] Yes USE TO CHECK BLOOD SUGAR UP TO 4 TIMES A DAY.  DX: E11.65 Emily Bang, DO Taking Active   Alpha-Lipoic Acid 200 MG CAPS [id number] No Take 200 mg by mouth daily.  Patient not taking: Reported on 01/16/2020   [provider] Not Taking Active Self  BD INSULIN SYRINGE U/F 31G X 5/16" 0.5 ML MISC [id number] Yes USE TO ADMINISTER INSULIN 3 TIMES DAILY WITH MEALS Emily Bang, DO Taking Active   clindamycin (CLEOCIN) 300 MG capsule [id number] Yes Take 1 capsule (300 mg total) by mouth 3 (three) times daily. Emily Bang, DO Taking Active   diclofenac (VOLTAREN) 75 MG EC tablet [id number] Yes Take 75 mg by mouth 2 (two) times daily as needed. [provider] Taking Active   doxycycline (VIBRA-TABS) 100 MG tablet [id number] No Take 1 tablet (100 mg total) by mouth 2 (two) times daily.  Patient not taking: Reported on 01/16/2020   Emily Bang, DO Not Taking Active   ferrous sulfate 325 (65 FE) MG tablet [id number]  Take 325 mg by mouth daily with breakfast. [provider]  Consider Medication Status and Discontinue (Patient Preference) Self  gabapentin (NEURONTIN) 100 MG capsule [id number] Yes Take 3 capsules (300 mg total) by mouth 3 (three) times daily. Emily Lose, MD Taking Active            Med Note Emily Wilson, Emily Wilson A   Wed Jan 16, 2020 10:50 AM) Taking as needed  [facility] 125 MG tablet [id number] Yes TAKE  1 TABLET (125 MG TOTAL) BY MOUTH DAILY. TAKE FOR 21 DAYS ON, 7 DAYS OFF, REPEAT EVERY 28 DAYS. Emily Lose, MD Taking Active   insulin NPH Human (HUMULIN N) 100 UNIT/ML injection [id number] Yes Inject 0.1 mLs (10 Units total) into the skin at bedtime.  Patient taking differently: Inject 12 Units into the skin at bedtime.    Emily Bang, DO Taking Active   insulin regular (HUMULIN R) 100 units/mL injection [id number] Yes Inject 0.1 mLs (10 Units total) into the skin 3 (three) times daily before meals. E.11.9. Hold dose for blood sugar less than 125 Emily Bang, DO Taking Active   letrozole [facility]) 2.5 MG tablet [id number] Yes TAKE 1 TABLET BY MOUTH EVERY DAY Emily Lose, MD Taking Active   metoprolol succinate (TOPROL XL) 25 MG 24 hr tablet [id number] Yes Take 1 tablet (25 mg total) by mouth daily. Emily Lance, MD Taking Active   Milk Thistle 1000 MG CAPS [id number] No Take 1,000 mg by mouth daily.   Patient not taking: Reported on 01/16/2020   [provider] Not Taking Active Self  mirabegron ER (MYRBETRIQ) 50 MG TB24 tablet [id number] No Take 1 tablet (50 mg total) by mouth daily.  Patient not taking: Reported on 01/16/2020   Emily Loser, MD Not Taking Active   Multiple Vitamin (MULTIVITAMIN WITH MINERALS) TABS tablet [id number] Yes Take 1 tablet by mouth daily. [provider] Taking Active Self  SUPER B COMPLEX/C PO [id number] No Take 1 tablet by mouth daily.  Patient not taking: Reported on 01/16/2020   [provider] Not Taking Consider Medication Status and Discontinue (Patient Preference) Self  Vitamin D, Ergocalciferol, (DRISDOL) 1.25 MG (50000 UNIT) CAPS capsule [id number] No Take 1 capsule by mouth once a week.  Patient not taking: Reported on 01/16/2020   [name], MD Not Taking Consider Medication Status and Discontinue (Patient Preference)   Med List Note Emily Wilson, [facility] 10/02/18 1151): Ibrance filled out [place]          Assessment:   Patient Care Plan: Diabetes Management    Problem Identified: Glycemic Management (Diabetes, Type 2)     Long-Range Goal: Glycemic Management Optimized   Start Date: 01/16/2020  Expected End Date: 03/19/2020  This Visit's Progress: On track  Priority: High  Note:    Current Barriers:  . Chronic Disease Management support and education needs related to Diabetes  Nurse Case Manager Clinical Goal(s):  Marland Kitchen Over the next 60 days, patient will meet with RN Care Manager to address barriers to managing  medical care . Over the next 60 days, the patient will demonstrate ongoing self health care management ability as evidenced by improving A1C  Interventions:  . Inter-disciplinary care team collaboration (see longitudinal plan of care) . Evaluation of current treatment plan related to Diabetes and patient's adherence to plan as established by provider. . Discussed plans with patient for ongoing care management follow up and provided patient with direct contact information for care management team . Reviewed scheduled/upcoming provider appointments  . Advised patient, providing education and rationale, to check cbg as directed by Dr. Juleen China and record, calling Dr. Juleen China for findings outside established parameters.    Patient Goals/Self-Care Activities Over the next 60 days, patient will: - Self administers medications as prescribed - Attends all scheduled provider appointments - Calls pharmacy for medication refills - Calls provider office for new concerns or questions - check blood sugar at prescribed times -  check blood sugar if I feel it is too high or too low - enter blood sugar readings and medication or insulin into daily log - take the blood sugar log to all doctor visits - take the blood sugar meter to all doctor visits -continue your diet of plant based food choices, limiting carbohydrates and sugars  Follow Up Plan: Telephone follow up appointment with Managed Medicaid care management team member scheduled for:03/19/20 @ 10:30am       Plan: RNCM will follow up with a telephone visit on 03/19/20 @ 10:30am  Lurena Joiner RN, BSN [place] RN Care Coordinator

## 2020-01-29 MED FILL — IBRANCE 125 MG TABS: 125 | 28 days supply | Qty: 21 | Fill #3

## 2020-01-31 ENCOUNTER — Other Ambulatory Visit: Payer: Self-pay | Admitting: *Deleted

## 2020-01-31 DIAGNOSIS — C50311 Malignant neoplasm of lower-inner quadrant of right female breast: Secondary | ICD-10-CM

## 2020-02-01 ENCOUNTER — Inpatient Hospital Stay: Payer: Medicaid Other | Attending: Medical

## 2020-02-01 ENCOUNTER — Inpatient Hospital Stay: Payer: Medicaid Other

## 2020-02-01 ENCOUNTER — Other Ambulatory Visit: Payer: Self-pay

## 2020-02-01 VITALS — BP 108/73 | HR 86 | Temp 98.0°F | Resp 18

## 2020-02-01 DIAGNOSIS — Z170 Estrogen receptor positive status [ER+]: Secondary | ICD-10-CM | POA: Insufficient documentation

## 2020-02-01 DIAGNOSIS — C50311 Malignant neoplasm of lower-inner quadrant of right female breast: Secondary | ICD-10-CM | POA: Diagnosis not present

## 2020-02-01 DIAGNOSIS — C7951 Secondary malignant neoplasm of bone: Secondary | ICD-10-CM | POA: Insufficient documentation

## 2020-02-01 DIAGNOSIS — Z923 Personal history of irradiation: Secondary | ICD-10-CM | POA: Diagnosis not present

## 2020-02-01 DIAGNOSIS — Z9011 Acquired absence of right breast and nipple: Secondary | ICD-10-CM | POA: Diagnosis not present

## 2020-02-01 LAB — CMP (CANCER CENTER ONLY)
ALT: 20 U/L (ref 0–44)
AST: 20 U/L (ref 15–41)
Albumin: 3.6 g/dL (ref 3.5–5.0)
Alkaline Phosphatase: 90 U/L (ref 38–126)
Anion gap: 8 (ref 5–15)
BUN: 18 mg/dL (ref 6–20)
CO2: 26 mmol/L (ref 22–32)
Calcium: 10.4 mg/dL — ABNORMAL HIGH (ref 8.9–10.3)
Chloride: 103 mmol/L (ref 98–111)
Creatinine: 0.83 mg/dL (ref 0.44–1.00)
GFR, Estimated: >60 mL/min (ref >60)
Glucose, Bld: 198 mg/dL — ABNORMAL HIGH (ref 70–99)
Potassium: 4.5 mmol/L (ref 3.5–5.1)
Sodium: 137 mmol/L (ref 135–145)
Total Bilirubin: 0.5 mg/dL (ref 0.3–1.2)
Total Protein: 7.3 g/dL (ref 6.5–8.1)

## 2020-02-01 LAB — CBC WITH DIFFERENTIAL (CANCER CENTER ONLY)
Abs Immature Granulocytes: 0.02 10*3/uL (ref 0.00–0.07)
Basophils Absolute: 0.1 10*3/uL (ref 0.0–0.1)
Basophils Relative: 1 %
Eosinophils Absolute: 0.2 10*3/uL (ref 0.0–0.5)
Eosinophils Relative: 3 %
HCT: 44.3 % (ref 36.0–46.0)
Hemoglobin: 14.8 g/dL (ref 12.0–15.0)
Immature Granulocytes: 0 %
Lymphocytes Relative: 25 %
Lymphs Abs: 1.8 10*3/uL (ref 0.7–4.0)
MCH: 31.8 pg (ref 26.0–34.0)
MCHC: 33.4 g/dL (ref 30.0–36.0)
MCV: 95.3 fL (ref 80.0–100.0)
Monocytes Absolute: 0.8 10*3/uL (ref 0.1–1.0)
Monocytes Relative: 11 %
Neutro Abs: 4.3 10*3/uL (ref 1.7–7.7)
Neutrophils Relative %: 60 %
Platelet Count: 323 10*3/uL (ref 150–400)
RBC: 4.65 MIL/uL (ref 3.87–5.11)
RDW: 12.1 % (ref 11.5–15.5)
WBC Count: 7.2 10*3/uL (ref 4.0–10.5)
nRBC: 0 % (ref 0.0–0.2)

## 2020-02-01 MED ORDER — DENOSUMAB 120 MG/1.7ML ~~LOC~~ SOLN
120.0000 mg | Freq: Once | SUBCUTANEOUS | Status: AC
  Administered 2020-02-01: 120 mg via SUBCUTANEOUS

## 2020-02-01 MED ORDER — DENOSUMAB 120 MG/1.7ML ~~LOC~~ SOLN
SUBCUTANEOUS | Status: AC
  Filled 2020-02-01: qty 1.7

## 2020-02-01 NOTE — Patient Instructions (Signed)
Denosumab injection What is this medicine? DENOSUMAB (den oh sue mab) slows bone breakdown. Prolia is used to treat osteoporosis in women after menopause and in men, and in people who are taking corticosteroids for 6 months or more. Xgeva is used to treat a high calcium level due to cancer and to prevent bone fractures and other bone problems caused by multiple myeloma or cancer bone metastases. Xgeva is also used to treat giant cell tumor of the bone. This medicine may be used for other purposes; ask your health care provider or pharmacist if you have questions. COMMON BRAND NAME(S): Prolia, XGEVA What should I tell my health care provider before I take this medicine? They need to know if you have any of these conditions:  dental disease  having surgery or tooth extraction  infection  kidney disease  low levels of calcium or Vitamin D in the blood  malnutrition  on hemodialysis  skin conditions or sensitivity  thyroid or parathyroid disease  an unusual reaction to denosumab, other medicines, foods, dyes, or preservatives  pregnant or trying to get pregnant  breast-feeding How should I use this medicine? This medicine is for injection under the skin. It is given by a health care professional in a hospital or clinic setting. A special MedGuide will be given to you before each treatment. Be sure to read this information carefully each time. For Prolia, talk to your pediatrician regarding the use of this medicine in children. Special care may be needed. For Xgeva, talk to your pediatrician regarding the use of this medicine in children. While this drug may be prescribed for children as young as 13 years for selected conditions, precautions do apply. Overdosage: If you think you have taken too much of this medicine contact a poison control center or emergency room at once. NOTE: This medicine is only for you. Do not share this medicine with others. What if I miss a dose? It is  important not to miss your dose. Call your doctor or health care professional if you are unable to keep an appointment. What may interact with this medicine? Do not take this medicine with any of the following medications:  other medicines containing denosumab This medicine may also interact with the following medications:  medicines that lower your chance of fighting infection  steroid medicines like prednisone or cortisone This list may not describe all possible interactions. Give your health care provider a list of all the medicines, herbs, non-prescription drugs, or dietary supplements you use. Also tell them if you smoke, drink alcohol, or use illegal drugs. Some items may interact with your medicine. What should I watch for while using this medicine? Visit your doctor or health care professional for regular checks on your progress. Your doctor or health care professional may order blood tests and other tests to see how you are doing. Call your doctor or health care professional for advice if you get a fever, chills or sore throat, or other symptoms of a cold or flu. Do not treat yourself. This drug may decrease your body's ability to fight infection. Try to avoid being around people who are sick. You should make sure you get enough calcium and vitamin D while you are taking this medicine, unless your doctor tells you not to. Discuss the foods you eat and the vitamins you take with your health care professional. See your dentist regularly. Brush and floss your teeth as directed. Before you have any dental work done, tell your dentist you are   receiving this medicine. Do not become pregnant while taking this medicine or for 5 months after stopping it. Talk with your doctor or health care professional about your birth control options while taking this medicine. Women should inform their doctor if they wish to become pregnant or think they might be pregnant. There is a potential for serious side  effects to an unborn child. Talk to your health care professional or pharmacist for more information. What side effects may I notice from receiving this medicine? Side effects that you should report to your doctor or health care professional as soon as possible:  allergic reactions like skin rash, itching or hives, swelling of the face, lips, or tongue  bone pain  breathing problems  dizziness  jaw pain, especially after dental work  redness, blistering, peeling of the skin  signs and symptoms of infection like fever or chills; cough; sore throat; pain or trouble passing urine  signs of low calcium like fast heartbeat, muscle cramps or muscle pain; pain, tingling, numbness in the hands or feet; seizures  unusual bleeding or bruising  unusually weak or tired Side effects that usually do not require medical attention (report to your doctor or health care professional if they continue or are bothersome):  constipation  diarrhea  headache  joint pain  loss of appetite  muscle pain  runny nose  tiredness  upset stomach This list may not describe all possible side effects. Call your doctor for medical advice about side effects. You may report side effects to FDA at 1-800-FDA-1088. Where should I keep my medicine? This medicine is only given in a clinic, doctor's office, or other health care setting and will not be stored at home. NOTE: This sheet is a summary. It may not cover all possible information. If you have questions about this medicine, talk to your doctor, pharmacist, or health care provider.  2020 Elsevier/Gold Standard (2017-06-17 16:10:44)

## 2020-02-03 ENCOUNTER — Other Ambulatory Visit: Payer: Self-pay | Admitting: Internal Medicine

## 2020-02-03 DIAGNOSIS — E1165 Type 2 diabetes mellitus with hyperglycemia: Secondary | ICD-10-CM

## 2020-02-08 DIAGNOSIS — Z4431 Encounter for fitting and adjustment of external right breast prosthesis: Secondary | ICD-10-CM | POA: Diagnosis not present

## 2020-02-08 DIAGNOSIS — C50111 Malignant neoplasm of central portion of right female breast: Secondary | ICD-10-CM | POA: Diagnosis not present

## 2020-02-10 ENCOUNTER — Other Ambulatory Visit: Payer: Self-pay | Admitting: Internal Medicine

## 2020-02-20 ENCOUNTER — Other Ambulatory Visit: Payer: Self-pay | Admitting: Hematology and Oncology

## 2020-02-20 ENCOUNTER — Telehealth: Payer: Self-pay | Admitting: Hematology and Oncology

## 2020-02-20 DIAGNOSIS — C50311 Malignant neoplasm of lower-inner quadrant of right female breast: Secondary | ICD-10-CM

## 2020-02-20 DIAGNOSIS — Z170 Estrogen receptor positive status [ER+]: Secondary | ICD-10-CM

## 2020-02-20 NOTE — Telephone Encounter (Signed)
Scheduled follow-up appointment per 12/29 schedule message. Patient is aware.

## 2020-02-25 MED FILL — IBRANCE 125 MG TABS: 125 | 28 days supply | Qty: 21 | Fill #0

## 2020-03-19 ENCOUNTER — Other Ambulatory Visit: Payer: Self-pay | Admitting: *Deleted

## 2020-03-19 ENCOUNTER — Other Ambulatory Visit: Payer: Self-pay

## 2020-03-19 NOTE — Patient Outreach (Cosign Needed)
Medicaid Managed Care   Nurse Care Manager Note  03/19/2020 Name:  Emily Wilson MRN:  [id number] DOB:  1965/10/20  Emily Wilson is an 55 y.o. year old female who is a primary patient of Emily Wilson.  The [facility] Managed Care Coordination team was consulted for assistance with:    DMII  [name] was given information about Medicaid Managed Care Coordination team services today. Emily Wilson agreed to services and verbal consent obtained.  Engaged with patient by telephone for follow up visit in response to provider referral for case management and/or care coordination services.   Assessments/Interventions:  Review of past medical history, allergies, medications, health status, including review of consultants reports, laboratory and other test data, was performed as part of comprehensive evaluation and provision of chronic care management services.  SDOH (Social Determinants of Health) assessments and interventions performed:   Care Plan  Allergies  Allergen Reactions  . Doxycycline Shortness Of Breath    Wheezing, shortness of breath, rash head to toe, and swelling  . Naproxen Shortness Of Breath  . Codeine Hives  . Hydrocodone-Acetaminophen Hives  . Ibuprofen     Irritant to stomach r/t diverticulitis  . Lantus [Insulin Glargine]     Yeast Infections  . Meloxicam Other (See Comments)    Abdominal pain   . Propoxyphene N-Acetaminophen Hives  . Sulfonamide Derivatives Hives  . Tramadol Other (See Comments)    Abdominal Pain    Medications Reviewed Today    Reviewed by [name], RN (Registered Nurse) on 03/19/20 at 1053  Med List Status: <None>  Medication Order Taking? Sig Documenting Provider Last Dose Status Informant  Accu-Chek FastClix Lancets MISC [id number] Yes USE TO TEST BLOOD SUGAR UP TO FOUR TIMES DAILY AS DIRECTED Emily Wilson Taking Active   ACCU-CHEK GUIDE test strip [id number] Yes USE TO CHECK BLOOD SUGAR  UP TO 4 TIMES A DAY. DX: E11.65 Emily Wilson Taking Active   Alpha-Lipoic Acid 200 MG CAPS [id number] No Take 200 mg by mouth daily.  Patient not taking: No sig reported   [provider] Not Taking Active Self  BD INSULIN SYRINGE U/F 31G X 5/16" 0.5 ML MISC [id number] Yes USE TO ADMINISTER INSULIN 3 TIMES DAILY WITH MEALS Emily Wilson Taking Active   clindamycin (CLEOCIN) 300 MG capsule [id number] No Take 1 capsule (300 mg total) by mouth 3 (three) times daily.  Patient not taking: Reported on 03/19/2020   Emily Wilson Not Taking Active   diclofenac (VOLTAREN) 75 MG EC tablet [id number] Yes Take 75 mg by mouth 2 (two) times daily as needed. [provider] Taking Active   doxycycline (VIBRA-TABS) 100 MG tablet [id number] No Take 1 tablet (100 mg total) by mouth 2 (two) times daily.  Patient not taking: No sig reported   Emily Wilson Not Taking Active   ferrous sulfate 325 (65 FE) MG tablet [id number] No Take 325 mg by mouth daily with breakfast.  Patient not taking: Reported on 03/19/2020   [provider] Not Taking Active Self  gabapentin (NEURONTIN) 100 MG capsule [id number] Yes Take 3 capsules (300 mg total) by mouth 3 (three) times daily. [name], MD Taking Active            Med Note [name], MELANIE A   Wed Jan 16, 2020 10:50 AM) Taking as needed  IBRANCE 125 MG tablet [id number] Yes TAKE 1 TABLET (125 MG TOTAL) BY  MOUTH DAILY. TAKE FOR 21 DAYS ON, 7 DAYS OFF, REPEAT EVERY 28 DAYS. [name], MD Taking Active   insulin NPH Human (HUMULIN N) 100 UNIT/ML injection [id number] Yes Inject 0.1 mLs (10 Units total) into the skin at bedtime.  Patient taking differently: Inject 12 Units into the skin at bedtime.   Emily Wilson Taking Active   insulin regular (HUMULIN R) 100 units/mL injection [id number] Yes Inject 0.1 mLs (10 Units total) into the skin 3 (three) times daily before meals.  E.11.9. Hold dose for blood sugar less than 125 Emily Wilson Taking Active   letrozole [facility]) 2.5 MG tablet [id number] Yes TAKE 1 TABLET BY MOUTH EVERY DAY [name], MD Taking Active   Loratadine (CLARITIN PO) [id number] Yes Take by mouth. [provider] Taking Active   metoprolol succinate (TOPROL-XL) 25 MG 24 hr tablet [id number] Yes TAKE 1 TABLET BY MOUTH EVERY DAY [name], MD Taking Active   Milk Thistle 1000 MG CAPS [id number] No Take 1,000 mg by mouth daily.   Patient not taking: No sig reported   [provider] Not Taking Active Self  mirabegron ER (MYRBETRIQ) 50 MG TB24 tablet [id number] No Take 1 tablet (50 mg total) by mouth daily.  Patient not taking: No sig reported   [name], MD Not Taking Active   Multiple Vitamin (MULTIVITAMIN WITH MINERALS) TABS tablet [id number] Yes Take 1 tablet by mouth daily. [provider] Taking Active Self  SUPER B COMPLEX/C PO [id number] No Take 1 tablet by mouth daily.  Patient not taking: No sig reported   [provider] Not Taking Active Self  Vitamin D, Ergocalciferol, (DRISDOL) 1.25 MG (50000 UNIT) CAPS capsule [id number] Yes Take 1 capsule by mouth once a week. [name], MD Taking Active   Med List Note [name], [facility] 10/02/18 1151): Ibrance filled out [place]          Patient Active Problem List   Diagnosis Date Noted  . History of shingles 01/09/2019  . S/P mastectomy, right 11/16/2018  . Bone metastasis ([place]) 05/31/2018  . Family history of breast cancer in female 03/13/2015  . Malignant neoplasm of lower-inner quadrant of right breast of female, estrogen receptor positive ([place]) 02/25/2015  . DM (diabetes mellitus), type 2 ([place]) 02/25/2015  . HLD (hyperlipidemia) 02/25/2015  . Palpitations 01/26/2010  . Anxiety state 05/26/2009  . Hyperthyroidism 11/14/2006  . Hypertension 11/14/2006    Conditions to be  addressed/monitored per PCP order:  DMII  Care Plan : Diabetes Management  Updates made by [name], RN since 03/19/2020 12:00 AM    Problem: Glycemic Management (Diabetes, Type 2)     Long-Range Goal: Glycemic Management Optimized   Start Date: 01/16/2020  Expected End Date: 05/19/2020  This Visit's Progress: On track  Recent Progress: On track  Priority: High  Note:    Current Barriers:  . Chronic Disease Management support and education needs related to Diabetes . Ms Zwick manages her diabetes by eating a healthy, plant based diet, checking blood sugars 3-4 times daily, attending routine screening appointments, and walking on days when the weather is nice.  Nurse Case Manager Clinical Goal(s):  Marland Kitchen Over the next 60 days, patient will meet with RN Care Manager to address barriers to managing medical care-[facility] reports not barriers at this time . Over the next 60 days, the patient will demonstrate ongoing self health care management ability as evidenced by improving  A1C-Next PCP appointment 04/01/20  Interventions:  . Inter-disciplinary care team collaboration (see longitudinal plan of care) . Evaluation of current treatment plan related to Diabetes and patient's adherence to plan as established by provider. . Discussed plans with patient for ongoing care management follow up and provided patient with direct contact information for care management team . Reviewed scheduled/upcoming provider appointments  . Advised patient, providing education and rationale, to check cbg as directed by Dr. Juleen China and record, calling Dr. Juleen China for findings outside established parameters.   . Provided education on exercises to Wilson while sitting, for days that she is unable to walk outside  Patient Goals/Self-Care Activities Over the next 60 days, patient will: - Self administers medications as prescribed - Attends all scheduled provider appointments - Calls pharmacy for medication  refills - Calls provider office for new concerns or questions - check blood sugar at prescribed times - check blood sugar if I feel it is too high or too low - enter blood sugar readings and medication or insulin into daily log - take the blood sugar log to all doctor visits - take the blood sugar meter to all doctor visits -continue your diet of plant based food choices, limiting carbohydrates and sugars -exercise daily as tolerated  Follow Up Plan: Telephone follow up appointment with Managed Medicaid care management team member scheduled for:05/19/20 @ 10:30am       Follow Up:  Patient agrees to Care Plan and Follow-up.  Plan: The Managed Medicaid care management team will reach out to the patient again over the next 60 days.  Date/time of next scheduled RN care management/care coordination outreach: 05/19/20 @ 10:30  Lurena Joiner RN, [place] RN Care Coordinator

## 2020-03-19 NOTE — Patient Instructions (Signed)
Visit Information  Emily Wilson was given information about Medicaid Managed Care team care coordination services as a part of their [employer] Medicaid benefit. Emily Wilson verbally consented to engagement with the [facility] Managed Care team.   For questions related to your [employer] health plan, please call: [phone] or visit the homepage here: [email or web address]  If you would like to schedule transportation through your [facility] plan, please call the following number at least 2 days in advance of your appointment: [phone]  Emily Wilson - following are the goals we discussed in your visit today:  Goals Addressed            This Visit's Progress   . Monitor and Manage My Blood Sugar            -check blood sugars as PCP recommends  -participate in light exercise daily  -continue to eat a plant based, low carb diet  -attend all scheduled appointments    . Set My Target A1C        - set target A1C -Patient and PCP would like for A1C to be 7.0 or less          Please see education materials related to exercise provided as print materials.     Exercises to do While Sitting  Exercises that you do while sitting (chair exercises) can give you many of the same benefits as full exercise. Benefits include strengthening your heart, burning calories, and keeping muscles and joints healthy. Exercise can also improve your mood and help with depression and anxiety. You may benefit from chair exercises if you are unable to do standing exercises because of:  Diabetic foot pain.  Obesity.  Illness.  Arthritis.  Recovery from surgery or injury.  Breathing problems.  Balance problems.  Another type of disability. Before starting chair exercises, check with your health care provider or a physical therapist to find out how much exercise you can tolerate and which exercises are safe for you. If your health  care provider approves:  Start out slowly and build up over time. Aim to work up to about 10-20 minutes for each exercise session.  Make exercise part of your daily routine.  Drink water when you exercise. Do not wait until you are thirsty. Drink every 10-15 minutes.  Stop exercising right away if you have pain, nausea, shortness of breath, or dizziness.  If you are exercising in a wheelchair, make sure to lock the wheels.  Ask your health care provider whether you can do tai chi or yoga. Many positions in these mind-body exercises can be modified to do while seated. Warm-up Before starting other exercises: 1. Sit up as straight as you can. Have your knees bent at 90 degrees, which is the shape of the capital letter "L." Keep your feet flat on the floor. 2. Sit at the front edge of your chair, if you can. 3. Pull in (tighten) the muscles in your abdomen and stretch your spine and neck as straight as you can. Hold this position for a few minutes. 4. Breathe in and out evenly. Try to concentrate on your breathing, and relax your mind. Stretching Exercise A: Arm stretch 1. Hold your arms out straight in front of your body. 2. Bend your hands at the wrist with your fingers pointing up, as if signaling someone to stop. Notice the slight tension in your forearms as you hold the position. 3. Keeping your arms out and your hands bent, rotate  your hands outward as far as you can and hold this stretch. Aim to have your thumbs pointing up and your pinkie fingers pointing down. Slowly repeat arm stretches for one minute as tolerated. Exercise B: Leg stretch 1. If you can move your legs, try to "draw" letters on the floor with the toes of your foot. Write your name with one foot. 2. Write your name with the toes of your other foot. Slowly repeat the movements for one minute as tolerated. Exercise C: Reach for the sky 1. Reach your hands as far over your head as you can to stretch your  spine. 2. Move your hands and arms as if you are climbing a rope. Slowly repeat the movements for one minute as tolerated. Range of motion exercises Exercise A: Shoulder roll 1. Let your arms hang loosely at your sides. 2. Lift just your shoulders up toward your ears, then let them relax back down. 3. When your shoulders feel loose, rotate your shoulders in backward and forward circles. Do shoulder rolls slowly for one minute as tolerated. Exercise B: March in place 1. As if you are marching, pump your arms and lift your legs up and down. Lift your knees as high as you can. ? If you are unable to lift your knees, just pump your arms and move your ankles and feet up and down. March in place for one minute as tolerated. Exercise C: Seated jumping jacks 1. Let your arms hang down straight. 2. Keeping your arms straight, lift them up over your head. Aim to point your fingers to the ceiling. 3. While you lift your arms, straighten your legs and slide your heels along the floor to your sides, as wide as you can. 4. As you bring your arms back down to your sides, slide your legs back together. ? If you are unable to use your legs, just move your arms. Slowly repeat seated jumping jacks for one minute as tolerated. Strengthening exercises Exercise A: Shoulder squeeze 1. Hold your arms straight out from your body to your sides, with your elbows bent and your fists pointed at the ceiling. 2. Keeping your arms in the bent position, move them forward so your elbows and forearms meet in front of your face. 3. Open your arms back out as wide as you can with your elbows still bent, until you feel your shoulder blades squeezing together. Hold for 5 seconds. Slowly repeat the movements forward and backward for one minute as tolerated. Contact a health care provider if you:  Had to stop exercising due to any of the following: ? Pain. ? Nausea. ? Shortness of breath. ? Dizziness. ? Fatigue.  Have  significant pain or soreness after exercising. Get help right away if you have:  Chest pain.  Difficulty breathing. These symptoms may represent a serious problem that is an emergency. Do not wait to see if the symptoms will go away. Get medical help right away. Call your local emergency services (911 in the [place] Do not drive yourself to the hospital. This information is not intended to replace advice given to you by your health care provider. Make sure you discuss any questions you have with your health care provider. Document Revised: 06/07/2019 Document Reviewed: 06/07/2019 Elsevier Patient Education  [place].   The patient verbalized understanding of instructions provided today and agreed to receive a mailed copy of patient instruction and/or educational materials.  Telephone follow up appointment with Managed Medicaid care management team member  scheduled for:05/19/20 @ 10:30am  [name], RN  Following is a copy of your plan of care:  Patient Care Plan: Diabetes Management    Problem Identified: Glycemic Management (Diabetes, Type 2)     Long-Range Goal: Glycemic Management Optimized   Start Date: 01/16/2020  Expected End Date: 05/19/2020  This Visit's Progress: On track  Recent Progress: On track  Priority: High  Note:    Current Barriers:  . Chronic Disease Management support and education needs related to Diabetes . Emily Wilson manages her diabetes by eating a healthy, plant based diet, checking blood sugars 3-4 times daily, attending routine screening appointments, and walking on days when the weather is nice.  Nurse Case Manager Clinical Goal(s):  Marland Kitchen Over the next 60 days, patient will meet with RN Care Manager to address barriers to managing medical care-Met-Emily Wilson reports not barriers at this time . Over the next 60 days, the patient will demonstrate ongoing self health care management ability as evidenced by improving A1C-Next PCP appointment  04/01/20  Interventions:  . Inter-disciplinary care team collaboration (see longitudinal plan of care) . Evaluation of current treatment plan related to Diabetes and patient's adherence to plan as established by provider. . Discussed plans with patient for ongoing care management follow up and provided patient with direct contact information for care management team . Reviewed scheduled/upcoming provider appointments  . Advised patient, providing education and rationale, to check cbg as directed by Dr. Juleen China and record, calling Dr. Juleen China for findings outside established parameters.   . Provided education on exercises to do while sitting, for days that she is unable to walk outside  Patient Goals/Self-Care Activities Over the next 60 days, patient will: - Self administers medications as prescribed - Attends all scheduled provider appointments - Calls pharmacy for medication refills - Calls provider office for new concerns or questions - check blood sugar at prescribed times - check blood sugar if I feel it is too high or too low - enter blood sugar readings and medication or insulin into daily log - take the blood sugar log to all doctor visits - take the blood sugar meter to all doctor visits -continue your diet of plant based food choices, limiting carbohydrates and sugars -exercise daily as tolerated  Follow Up Plan: Telephone follow up appointment with Managed Medicaid care management team member scheduled for:05/19/20 @ 10:30am

## 2020-03-26 ENCOUNTER — Telehealth: Payer: Medicaid Other | Admitting: Internal Medicine

## 2020-03-26 MED FILL — IBRANCE 125 MG TABS: 125 | 28 days supply | Qty: 21 | Fill #1

## 2020-03-31 ENCOUNTER — Other Ambulatory Visit: Payer: Medicaid Other

## 2020-03-31 ENCOUNTER — Other Ambulatory Visit: Payer: Self-pay

## 2020-03-31 DIAGNOSIS — Z794 Long term (current) use of insulin: Secondary | ICD-10-CM | POA: Diagnosis not present

## 2020-03-31 DIAGNOSIS — E1159 Type 2 diabetes mellitus with other circulatory complications: Secondary | ICD-10-CM | POA: Diagnosis not present

## 2020-03-31 DIAGNOSIS — E782 Mixed hyperlipidemia: Secondary | ICD-10-CM | POA: Diagnosis not present

## 2020-04-01 ENCOUNTER — Telehealth (INDEPENDENT_AMBULATORY_CARE_PROVIDER_SITE_OTHER): Payer: Medicaid Other | Admitting: Internal Medicine

## 2020-04-01 ENCOUNTER — Encounter: Payer: Self-pay | Admitting: Internal Medicine

## 2020-04-01 VITALS — BP 106/74 | HR 94 | Temp 97.3°F | Resp 18 | Ht 59.0 in | Wt 149.6 lb

## 2020-04-01 DIAGNOSIS — Z794 Long term (current) use of insulin: Secondary | ICD-10-CM | POA: Diagnosis not present

## 2020-04-01 DIAGNOSIS — E785 Hyperlipidemia, unspecified: Secondary | ICD-10-CM

## 2020-04-01 DIAGNOSIS — E1159 Type 2 diabetes mellitus with other circulatory complications: Secondary | ICD-10-CM | POA: Diagnosis not present

## 2020-04-01 DIAGNOSIS — I10 Essential (primary) hypertension: Secondary | ICD-10-CM | POA: Diagnosis not present

## 2020-04-01 LAB — LIPID PANEL
Chol/HDL Ratio: 6.9 ratio — ABNORMAL HIGH (ref 0.0–4.4)
Cholesterol, Total: 235 mg/dL — ABNORMAL HIGH (ref 100–199)
HDL: 34 mg/dL — ABNORMAL LOW (ref >39)
LDL Chol Calc (NIH): 172 mg/dL — ABNORMAL HIGH (ref 0–99)
Triglycerides: 154 mg/dL — ABNORMAL HIGH (ref 0–149)
VLDL Cholesterol Cal: 29 mg/dL (ref 5–40)

## 2020-04-01 LAB — HEMOGLOBIN A1C
Est. average glucose Bld gHb Est-mCnc: 174 mg/dL
Hgb A1c MFr Bld: 7.7 % — ABNORMAL HIGH (ref 4.8–5.6)

## 2020-04-01 MED ORDER — INSULIN NPH (HUMAN) (ISOPHANE) 100 UNIT/ML ~~LOC~~ SUSP: 12.0000 [IU] | Freq: Every day | SUBCUTANEOUS | 1 refills | Status: DC

## 2020-04-01 MED ORDER — INSULIN REGULAR HUMAN 100 UNIT/ML IJ SOLN: 12.0000 [IU] | Freq: Three times a day (TID) | INTRAMUSCULAR | 11 refills | Status: DC

## 2020-04-01 NOTE — Progress Notes (Signed)
Needs RFs on both insulins

## 2020-04-01 NOTE — Progress Notes (Signed)
Virtual Visit via Telephone Note  I connected with Emily Wilson, on 04/01/2020 at 9:58 AM by telephone due to the COVID-19 pandemic and verified that I am speaking with the correct person using two identifiers.   Consent: I discussed the limitations, risks, security and privacy concerns of performing an evaluation and management service by telephone and the availability of in person appointments. I also discussed with the patient that there may be a patient responsible charge related to this service. The patient expressed understanding and agreed to proceed.   Location of Patient: Home   Location of Provider: Clinic    Persons participating in Telemedicine visit: Tanaisha Pittman Dr. Juleen China      History of Present Illness: Patient has a visit to f/u on chronic medical conditions.   Diabetes mellitus, Type 2 Disease Monitoring             Blood Sugar Ranges: Fasting - 150s              Polyuria: no             Visual problems: no   Urine Microalbumin 3 (Feb 2021)   Last A1C: 7.7 (03/31/20) previously 7.2 (Nov 2021)   Medications: Insulin NPH 10u nightly, Insulin Humulin 12u TID (has this mixed up)  Medication Compliance: yes  Medication Side Effects             Hypoglycemia: no   Chronic HTN Disease Monitoring:  Home BP Monitoring - Doesn't write down but says usually runs "pretty low".  Chest pain- no  Dyspnea- no Headache - no  Medications: Metoprolol XL 25 mg Compliance- yes Lightheadedness- no  Edema- no   The 10-year ASCVD risk score [name], et al., 2013) is: 6.2%   Values used to calculate the score:     Age: 55 years     Sex: Female     Is Non-Hispanic African American: No     Diabetic: Yes     Tobacco smoker: No     Systolic Blood Pressure: 308 mmHg     Is BP treated: Yes     HDL Cholesterol: 34 mg/dL     Total Cholesterol: 235 mg/dL      Past Medical History:  Diagnosis Date  . Anxiety state,  unspecified   . Arthritis   . Breast cancer of lower-inner quadrant of right female breast ([place])   . Carpal tunnel syndrome   . Complication of anesthesia    woke up during ganglion cyst removal in her 20's, not woken up since  . Diabetes mellitus without complication ([place])    Type II  . Heart murmur   . History of radiation therapy 12/18/18- 01/30/19   Right Chest wall 25 fractions X 2Gy each to total 50 Gy, followed by a boost 10 Gy in 5 fractions.   . Hyperlipidemia   . Hyperthyroidism   . Mitral valve prolapse   . Smoker   . Tachycardia   . 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 Doxycycline Shortness Of Breath    Wheezing, shortness of breath, rash head to toe, and swelling  . Naproxen Shortness Of Breath  . Codeine Hives  . Hydrocodone-Acetaminophen Hives  . Ibuprofen     Irritant to stomach r/t diverticulitis  . Lantus [Insulin Glargine]     Yeast Infections  . Meloxicam Other (See Comments)  Abdominal pain   . Propoxyphene N-Acetaminophen Hives  . Sulfonamide Derivatives Hives  . Tramadol Other (See Comments)    Abdominal Pain    Current Outpatient Medications on File Prior to Visit  Medication Sig Dispense Refill  . Accu-Chek FastClix Lancets MISC USE TO TEST BLOOD SUGAR UP TO FOUR TIMES DAILY AS DIRECTED 204 each 2  . ACCU-CHEK GUIDE test strip USE TO CHECK BLOOD SUGAR UP TO 4 TIMES A DAY. DX: E11.65 200 strip 2  . Alpha-Lipoic Acid 200 MG CAPS Take 200 mg by mouth daily. (Patient not taking: No sig reported)    . BD INSULIN SYRINGE U/F 31G X 5/16" 0.5 ML MISC USE TO ADMINISTER INSULIN 3 TIMES DAILY WITH MEALS 300 each 2  . clindamycin (CLEOCIN) 300 MG capsule Take 1 capsule (300 mg total) by mouth 3 (three) times daily. (Patient not taking: Reported on 03/19/2020) 21 capsule 0  . diclofenac (VOLTAREN) 75 MG EC tablet Take 75 mg by mouth 2 (two) times daily as  needed.    . doxycycline (VIBRA-TABS) 100 MG tablet Take 1 tablet (100 mg total) by mouth 2 (two) times daily. (Patient not taking: No sig reported) 14 tablet 0  . ferrous sulfate 325 (65 FE) MG tablet Take 325 mg by mouth daily with breakfast. (Patient not taking: Reported on 03/19/2020)    . gabapentin (NEURONTIN) 100 MG capsule Take 3 capsules (300 mg total) by mouth 3 (three) times daily. 90 capsule 1  . IBRANCE 125 MG tablet TAKE 1 TABLET (125 MG TOTAL) BY MOUTH DAILY. TAKE FOR 21 DAYS ON, 7 DAYS OFF, REPEAT EVERY 28 DAYS. 21 tablet 3  . insulin NPH Human (HUMULIN N) 100 UNIT/ML injection Inject 0.1 mLs (10 Units total) into the skin at bedtime. (Patient taking differently: Inject 12 Units into the skin at bedtime.) 10 mL 11  . insulin regular (HUMULIN R) 100 units/mL injection Inject 0.1 mLs (10 Units total) into the skin 3 (three) times daily before meals. E.11.9. Hold dose for blood sugar less than 125 10 mL 11  . letrozole (FEMARA) 2.5 MG tablet TAKE 1 TABLET BY MOUTH EVERY DAY 90 tablet 3  . Loratadine (CLARITIN PO) Take by mouth.    . metoprolol succinate (TOPROL-XL) 25 MG 24 hr tablet TAKE 1 TABLET BY MOUTH EVERY DAY 90 tablet 3  . Milk Thistle 1000 MG CAPS Take 1,000 mg by mouth daily.  (Patient not taking: No sig reported)    . mirabegron ER (MYRBETRIQ) 50 MG TB24 tablet Take 1 tablet (50 mg total) by mouth daily. (Patient not taking: No sig reported) 30 tablet 11  . Multiple Vitamin (MULTIVITAMIN WITH MINERALS) TABS tablet Take 1 tablet by mouth daily.    . SUPER B COMPLEX/C PO Take 1 tablet by mouth daily. (Patient not taking: No sig reported)    . Vitamin D, Ergocalciferol, (DRISDOL) 1.25 MG (50000 UNIT) CAPS capsule Take 1 capsule by mouth once a week.     No current facility-administered medications on file prior to visit.    Observations/Objective:  Vitals:   04/01/20 0959  BP: 106/74  Pulse: 94  Resp: 18  Temp: (!) 97.3 F (36.3 C)   NAD. Speaking clearly.  Work of  breathing normal.  Alert and oriented. Mood appropriate.   Assessment and Plan: 1. Type 2 diabetes mellitus with other circulatory complication, with long-term current use of insulin (HCC) A1c 7.7, slightly worsened from prior. No hypoglycemia noted. Fasting CBGs above goal slightly. Increase NPH  to 12u and continue Insulin Regular at 12u TID.  - insulin regular (HUMULIN R) 100 units/mL injection; Inject 0.12 mLs (12 Units total) into the skin 3 (three) times daily before meals. E.11.9. Hold dose for blood sugar less than 125  Dispense: 10 mL; Refill: 11 - insulin NPH Human (HUMULIN N) 100 UNIT/ML injection; Inject 0.12 mLs (12 Units total) into the skin at bedtime.  Dispense: 10 mL; Refill: 1  2. Primary hypertension BP at goal yesterday during lab visit. Asymptomatic. Continue Metoprolol.   3. Hyperlipidemia, unspecified hyperlipidemia type Discussed with patient that lipid panel was elevated. ASCVD risk is still fairly well controlled. Patient not candidate for statin due to [facility]. Would recommend aiming for 150 minutes of cardiac exercise per week. Advise to decrease animal fat intake, saturated fats, processed foods. Increase fiber rich foods such as fruits and veggies.    Follow Up Instructions: 3 month f/u for chronic medical conditions    I discussed the assessment and treatment plan with the patient. The patient was provided an opportunity to ask questions and all were answered. The patient agreed with the plan and demonstrated an understanding of the instructions.   The patient was advised to call back or seek an in-person evaluation if the symptoms worsen or if the condition fails to improve as anticipated.     I provided 12 minutes total of non-face-to-face time during this encounter including median intraservice time, reviewing previous notes, investigations, ordering medications, medical decision making, coordinating care and patient verbalized understanding at the end of  the visit.    Phill Myron, D.O. Primary Care at [facility]  04/01/2020, 9:58 AM

## 2020-04-02 NOTE — Progress Notes (Incomplete)
HEMATOLOGY-ONCOLOGY MYCHART VIDEO VISIT PROGRESS NOTE  I connected with Emily Wilson on 04/02/2020 at  2:00 PM EST by MyChart video conference and verified that I am speaking with the correct person using two identifiers.  I discussed the limitations, risks, security and privacy concerns of performing an evaluation and management service by MyChart and the availability of in person appointments.  I also discussed with the patient that there may be a patient responsible charge related to this service. The patient expressed understanding and agreed to proceed.  Patient's Location: Home Physician Location: Clinic  CHIEF COMPLIANT: Follow-up of metastatic breaston Ibrance and anastrozole  INTERVAL HISTORY: Emily Wilson is a 55 y.o. female with above-mentioned history of metastatic breast cancer currently on anastrozoleand Ibrance. She presents over MyChart todayfora toxicity check.  Oncology History  Malignant neoplasm of lower-inner quadrant of right breast of female, estrogen receptor positive ([place])  02/19/2015 Mammogram   Right breast irregular mass lower inner quadrant 2.2 x 1.3 x 1.7 cm with architectural distortion and skin/nipple retraction, T2 N0 stage II a clinical stage, additional benign cysts largest 1.2 cm   02/20/2015 Initial Diagnosis   Right breast biopsy 5:00: Invasive ductal carcinoma grade 2, perineural invasion present, ER 90%, PR 70%, HER-2 negative ratio 0.95, Ki-67 15%    03/07/2015 Breast MRI   right breast inferior subareolar mass measuring 2.2 x 2.4 x 2.5 cm with skin and nipple enhancement, extending inferiorly and laterally is intraductal enhancement over a distance of 4 cm, several level I right axillary lymph nodes with cortical thickening   03/13/2015 -  Anti-estrogen oral therapy   Neoadjuvant antiestrogen therapy with tamoxifen 20 mg daily (because the patient did not want to undergo surgery immediately for multiple family reasons) stopped in late 2017;  anastrozole started 05/11/18   05/23/2018 Imaging   CT CAP: 2.9 cm spiculated soft tissue density central right breast, no evidence of soft tissue metastatic disease within the chest abdomen pelvis.  Subcentimeter sclerotic bone lesions T10, left pubis, right posterior ilium Bone scan: Foci of uptake left frontal calvarium, T-spine, right ilium   10/04/2018 -  Anti-estrogen oral therapy   Ibrance with anastrozole   11/16/2018 Surgery   Right mastectomy Ninfa [facility]): IDC, grade 1, 5.2cm, grossly involving underlying skin, clear margins.   12/07/2018 Cancer Staging   Staging form: Breast, AJCC 7th Edition - Pathologic stage from 12/07/2018: Stage IV (yT4b, NX, M1) - Signed by Eppie Gibson, MD on 12/07/2018   12/19/2018 - 01/30/2019 Radiation Therapy   Adjuvant radiation   05/16/2019 Miscellaneous   Caris molecular testing: ER positive, PI K3 CA mutation present, HER-2 negative, ER positive TMB low BRCA1 not detected, ESR 1 not detected, PD-L1 negative     Observations/Objective:  There were no vitals filed for this visit. There is no height or weight on file to calculate BMI.  I have reviewed the data as listed CMP Latest Ref Rng & Units 02/01/2020 11/02/2019 09/10/2019  Glucose 70 - 99 mg/dL 198(H) 150(H) 129(H)  BUN 6 - 20 mg/dL '18 13 17  ' Creatinine 0.44 - 1.00 mg/dL 0.83 0.75 0.76  Sodium 135 - 145 mmol/L 137 137 139  Potassium 3.5 - 5.1 mmol/L 4.5 4.2 4.1  Chloride 98 - 111 mmol/L 103 104 105  CO2 22 - 32 mmol/L '26 24 24  ' Calcium 8.9 - 10.3 mg/dL 10.4(H) 9.9 9.7  Total Protein 6.5 - 8.1 g/dL 7.3 7.4 7.6  Total Bilirubin 0.3 - 1.2 mg/dL 0.5 0.5 0.4  Alkaline  Phos 38 - 126 U/L 90 77 73  AST 15 - 41 U/L '20 25 15  ' ALT 0 - 44 U/L '20 29 19    ' Lab Results  Component Value Date   WBC 7.2 02/01/2020   HGB 14.8 02/01/2020   HCT 44.3 02/01/2020   MCV 95.3 02/01/2020   PLT 323 02/01/2020   NEUTROABS 4.3 02/01/2020      Assessment Plan:  No problem-specific Assessment & Plan notes  found for this encounter.    I discussed the assessment and treatment plan with the patient. The patient was provided an opportunity to ask questions and all were answered. The patient agreed with the plan and demonstrated an understanding of the instructions. The patient was advised to call back or seek an in-person evaluation if the symptoms worsen or if the condition fails to improve as anticipated.   Total time spent: *** minutes including face-to-face MyChart video visit time and time spent for planning, charting and coordination of care  Rulon Eisenmenger, MD 04/02/2020   I, Cloyde Reams Dorshimer, am acting as scribe for [name], MD.  {insert scribe attestation}

## 2020-04-03 ENCOUNTER — Encounter: Payer: Self-pay | Admitting: Emergency Medicine

## 2020-04-03 ENCOUNTER — Other Ambulatory Visit: Payer: Self-pay

## 2020-04-03 ENCOUNTER — Inpatient Hospital Stay: Payer: Medicaid Other | Admitting: Hematology and Oncology

## 2020-04-03 ENCOUNTER — Ambulatory Visit (INDEPENDENT_AMBULATORY_CARE_PROVIDER_SITE_OTHER): Payer: Medicaid Other

## 2020-04-03 ENCOUNTER — Other Ambulatory Visit: Payer: Self-pay | Admitting: Hematology and Oncology

## 2020-04-03 ENCOUNTER — Inpatient Hospital Stay: Payer: Medicaid Other

## 2020-04-03 ENCOUNTER — Ambulatory Visit
Admission: EM | Admit: 2020-04-03 | Discharge: 2020-04-03 | Disposition: A | Discharge status: Home / Self Care | Payer: Medicaid Other | Attending: Family Medicine | Admitting: Family Medicine

## 2020-04-03 DIAGNOSIS — C50311 Malignant neoplasm of lower-inner quadrant of right female breast: Secondary | ICD-10-CM

## 2020-04-03 DIAGNOSIS — J189 Pneumonia, unspecified organism: Secondary | ICD-10-CM

## 2020-04-03 DIAGNOSIS — Z170 Estrogen receptor positive status [ER+]: Secondary | ICD-10-CM

## 2020-04-03 DIAGNOSIS — R0902 Hypoxemia: Secondary | ICD-10-CM

## 2020-04-03 DIAGNOSIS — R059 Cough, unspecified: Secondary | ICD-10-CM | POA: Diagnosis not present

## 2020-04-03 DIAGNOSIS — Z1152 Encounter for screening for COVID-19: Secondary | ICD-10-CM

## 2020-04-03 DIAGNOSIS — R0602 Shortness of breath: Secondary | ICD-10-CM | POA: Diagnosis not present

## 2020-04-03 DIAGNOSIS — C7951 Secondary malignant neoplasm of bone: Secondary | ICD-10-CM

## 2020-04-03 MED ORDER — BENZONATATE 100 MG PO CAPS: 200.0000 mg | ORAL_CAPSULE | Freq: Three times a day (TID) | ORAL | 0 refills | Status: DC | PRN

## 2020-04-03 MED ORDER — AZITHROMYCIN 250 MG PO TABS: ORAL_TABLET | ORAL | 0 refills | Status: DC

## 2020-04-03 MED ORDER — ALBUTEROL SULFATE HFA 108 (90 BASE) MCG/ACT IN AERS
2.0000 | INHALATION_SPRAY | RESPIRATORY_TRACT | Status: DC | PRN
  Administered 2020-04-03: 2 via RESPIRATORY_TRACT

## 2020-04-03 MED ORDER — PREDNISONE 20 MG PO TABS: 20.0000 mg | ORAL_TABLET | Freq: Every day | ORAL | 0 refills | Status: AC

## 2020-04-03 NOTE — Assessment & Plan Note (Deleted)
Right breast irregular mass lower inner quadrant retroareolar 02/19/2015: 2.2 x 1.3 x 1.7 cm with architectural distortion and skin/nipple retraction, T2 N0 stage II a clinical stage, additional benign cysts largest 1.2 cm Right breast biopsy 02/20/2015 5:00: Invasive ductal carcinoma grade 2, perineural invasion present, ER 90%, PR 70%, HER-2 negative ratio 0.95, Ki-67 15%   Breast MRI 03/07/2015: Subareolar mass 2.2 x 2.4 x 2.5 cm extending into the retracted nipple, extending inferiorly and laterally over a distance of 4 cm, several level I right axillary lymph nodes with cortical thickening up to 7 mm with a maximum diameter of 15 mm  05/16/2019:Caris molecular testing: ER positive, PI K3 CA mutation present, HER-2 negative, ER positive TMB low BRCA1 not detected, ESR 1 not detected, PD-L1 negative --------------------------------------------------------------------- Treatment summary:Anastrozole with Delton See started April 2020Ibrance added 09/29/2018 Bone scan: 09/20/2018: Progression of bone metastatic disease new metastases disease right lesser trochanter, mid left femoral diaphysis, additional bone mets involving left frontal, right iliac, right SI joint, right sixth rib, 12th rib, T12 transverse process and T10  Ibrancetoxicities:No adverse effectspreviously She resumedIbrance February 23, 2019  11/16/2018:Right mastectomy ([place])performed because the tumor was fungating: IDC, grade 1, 5.2cm, grossly involving underlying skin, clear margins.  Radiation to chest wall: 12/19/18- 01/30/19 CT CAP 09/19/2019: Increasing sclerosis in the bone metastases this may reflect treatment related change.  No new lesions.  Colonic diverticulosis with diverticular disease.  Plan: Continuation of current treatment Patient will see [facility] gastroenterology regarding the diverticular issues.  She will need a colonoscopy.  She needs scans. We will schedule them and follow up after that with MyChart  virtual visit  Return to clinic in 1 month for Xgeva injection and in 4 months with labs injection and follow-up with me.

## 2020-04-03 NOTE — ED Triage Notes (Signed)
Pt said she has been having nasal drainage x 1week and then started having a cough, no fevers, and was taking Tussin DM with no relief. Pt said she is blowing out blood and coughing some up. She says she feels dry.

## 2020-04-03 NOTE — Discharge Instructions (Addendum)
Chest x-ray shows multifocal pneumonia which is consistent with that of a viral pneumonia.  This is typically seen in patients are positive for COVID however will not have your Covid test results for the next 3 to 5 days.  Given this finding I would recommend masking when out in public.  While at home monitor your oxygen level if your oxygen levels drop below 89% and are stated at that rate you need to go immediately to the ER for further evaluation. Start azithromycin today and take as directed. I am also treating you with a low-dose of steroid as you do have diabetes this can cause your glucose to become elevated therefore monitor your blood sugars. For shortness of breath or wheezing start albuterol inhaler 2 puffs every 4-6 hours as needed.  Use the chamber to help facilitate appropriate delivery of the medication. I have also sent over benzonatate for management of your cough.  Recommend rest and hydration.  If you develop any severe chest pain and rapid heart rate go immediately to the ER.

## 2020-04-03 NOTE — ED Provider Notes (Signed)
[name]    [name]: [id number] Arrival date & time: 04/03/20  0917      History   Chief Complaint Chief Complaint  Patient presents with  . Cough    HPI Emily Wilson is a 55 y.o. female.   HPI  Patient presents with 1 week of cough which started as nasal pressure with nasal congestion and sore throat.  She denies any known fever.  On arrival today her oxygen level was fluctuated between 93 and 94%.  Patient has known history of metastatic breast cancer with bone mets.  Patient endorses some shortness of breath.  She does have a oxygen meter at home.  She also suffers from type 2 diabetes but endorses she has not been able to eat due to current symptoms and feeling fatigue.  Denies any known sick contacts. Past Medical History:  Diagnosis Date  . Anxiety state, unspecified   . Arthritis   . Breast cancer of lower-inner quadrant of right female breast ([place])   . Carpal tunnel syndrome   . Complication of anesthesia    woke up during ganglion cyst removal in her 20's, not woken up since  . Diabetes mellitus without complication ([place])    Type II  . Heart murmur   . History of radiation therapy 12/18/18- 01/30/19   Right Chest wall 25 fractions X 2Gy each to total 50 Gy, followed by a boost 10 Gy in 5 fractions.   . Hyperlipidemia   . Hyperthyroidism   . Mitral valve prolapse   . Smoker   . Tachycardia   . 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 History of shingles 01/09/2019  . S/P mastectomy, right 11/16/2018  . Bone metastasis ([address]) 05/31/2018  . Family history of breast cancer in female 03/13/2015  . Malignant neoplasm of lower-inner quadrant of right breast of female, estrogen receptor positive ([place]) 02/25/2015  . DM (diabetes mellitus), type 2 ([place]) 02/25/2015  . HLD (hyperlipidemia) 02/25/2015  . Palpitations 01/26/2010  .  Anxiety state 05/26/2009  . Hyperthyroidism 11/14/2006  . Hypertension 11/14/2006    Past Surgical History:  Procedure Laterality Date  . ABDOMINAL HYSTERECTOMY    . APPENDECTOMY    . CARPAL TUNNEL RELEASE    . GANGLION CYST EXCISION Right   . KNEE SURGERY Right 2007   ACL repair  . TOTAL MASTECTOMY Right 11/16/2018   Procedure: RIGHT MASTECTOMY;  Surgeon: Coralie Keens, MD;  Location: [place];  Service: General;  Laterality: Right;  . TUBAL LIGATION      OB History   No obstetric history on file.      Home Medications    Prior to Admission medications   Medication Sig Start Date End Date Taking? Authorizing Provider  Accu-Chek FastClix Lancets MISC USE TO TEST BLOOD SUGAR UP TO FOUR TIMES DAILY AS DIRECTED 02/04/20   [name], DO  ACCU-CHEK GUIDE test strip USE TO CHECK BLOOD SUGAR UP TO 4 TIMES A DAY. DX: E11.65 02/04/20   [name], DO  Alpha-Lipoic Acid 200 MG CAPS Take 200 mg by mouth daily. Patient not taking: No sig reported    [provider]  BD INSULIN SYRINGE U/F 31G X 5/16" 0.5 ML MISC USE TO ADMINISTER INSULIN 3 TIMES DAILY WITH MEALS 10/31/19   [name], DO  clindamycin (CLEOCIN) 300 MG capsule Take  1 capsule (300 mg total) by mouth 3 (three) times daily. Patient not taking: No sig reported 12/27/19   [name], DO  diclofenac (VOLTAREN) 75 MG EC tablet Take 75 mg by mouth 2 (two) times daily as needed.    [provider]  doxycycline (VIBRA-TABS) 100 MG tablet Take 1 tablet (100 mg total) by mouth 2 (two) times daily. Patient not taking: No sig reported 12/25/19   [name], DO  ferrous sulfate 325 (65 FE) MG tablet Take 325 mg by mouth daily with breakfast. Patient not taking: No sig reported    [provider]  gabapentin (NEURONTIN) 100 MG capsule Take 3 capsules (300 mg total) by mouth 3 (three) times daily. 02/06/19   [name], MD  IBRANCE 125 MG  tablet TAKE 1 TABLET (125 MG TOTAL) BY MOUTH DAILY. TAKE FOR 21 DAYS ON, 7 DAYS OFF, REPEAT EVERY 28 DAYS. 02/20/20   [name], MD  insulin NPH Human (HUMULIN N) 100 UNIT/ML injection Inject 0.12 mLs (12 Units total) into the skin at bedtime. 04/01/20   [name], DO  insulin regular (HUMULIN R) 100 units/mL injection Inject 0.12 mLs (12 Units total) into the skin 3 (three) times daily before meals. E.11.9. Hold dose for blood sugar less than 125 04/01/20   [name], DO  letrozole [facility]) 2.5 MG tablet TAKE 1 TABLET BY MOUTH EVERY DAY 07/16/19   [name], MD  Loratadine (CLARITIN PO) Take by mouth.    [provider]  metoprolol succinate (TOPROL-XL) 25 MG 24 hr tablet TAKE 1 TABLET BY MOUTH EVERY DAY 02/12/20   [name], MD  Milk Thistle 1000 MG CAPS Take 1,000 mg by mouth daily.    [provider]  mirabegron ER (MYRBETRIQ) 50 MG TB24 tablet Take 1 tablet (50 mg total) by mouth daily. 08/20/19   [name], MD  Multiple Vitamin (MULTIVITAMIN WITH MINERALS) TABS tablet Take 1 tablet by mouth daily.    [provider]  SUPER B COMPLEX/C PO Take 1 tablet by mouth daily.    [provider]  Vitamin D, Ergocalciferol, (DRISDOL) 1.25 MG (50000 UNIT) CAPS capsule Take 1 capsule by mouth once a week. 12/11/19   [name], MD    Family History Family History  Problem Relation Age of Onset  . Diabetes Mother   . Bladder Cancer Mother 18       smoker  . Other Mother 22       TAH for unspecified reason  . Multiple sclerosis Mother   . Cancer Father 74       lymphatic/tonsil cancer - in remission; former smoker  . COPD Maternal Grandmother        smoker  . Emphysema Maternal Grandmother        smoker  . Stroke Maternal Grandfather   . Dementia Maternal Uncle   . Diabetes Paternal Grandmother   . COPD Maternal Uncle        smoker  . Multiple sclerosis Paternal Aunt   . Breast cancer Paternal Aunt         dx. late 41s - early 59s    Social History Social History   Tobacco Use  . Smoking status: Former Smoker    Packs/day: 3.00    Years: 30.00    Pack years: 90.00    Types: Cigarettes    Quit date: 10/24/2011    Years since quitting: 8.4  . Smokeless tobacco: Never Used  Vaping Use  . Vaping Use: Never used  Substance Use Topics  . Alcohol use: No  . Drug use: Never    Comment: updated as of 09/06/2018 - pt reports no illegal drug use      Allergies   Doxycycline, Naproxen, Codeine, Hydrocodone-acetaminophen, Ibuprofen, Lantus [insulin glargine], Meloxicam, Propoxyphene n-acetaminophen, Sulfonamide derivatives, and Tramadol   Review of Systems Review of Systems Pertinent negatives listed in HPI   Physical Exam Triage Vital Signs ED Triage Vitals  Enc Vitals Group     BP 04/03/20 0951 136/88     Pulse Rate 04/03/20 0951 87     Resp 04/03/20 0951 16     Temp 04/03/20 0951 98 F (36.7 C)     Temp Source 04/03/20 0951 Oral     SpO2 04/03/20 0951 94 %     Weight --      Height --      Head Circumference --      Peak Flow --      Pain Score 04/03/20 0950 0     Pain Loc --      Pain Edu? --      Excl. in [place]? --    No data found.  Updated Vital Signs BP 136/88 (BP Location: Right Arm)   Pulse 87   Temp 98 F (36.7 C) (Oral)   Resp 16   SpO2 94%   Visual Acuity Right Eye Distance:   Left Eye Distance:   Bilateral Distance:    Right Eye Near:   Left Eye Near:    Bilateral Near:     Physical Exam Constitutional:      Appearance: She is overweight. She is ill-appearing. She is not toxic-appearing.  HENT:     Head: Normocephalic.     Nose: Mucosal edema and congestion present.     Right Turbinates: Enlarged.     Mouth/Throat:     Mouth: Mucous membranes are moist.  Cardiovascular:     Rate and Rhythm: Normal rate and regular rhythm.  Pulmonary:     Breath sounds: Decreased air movement present. Decreased breath sounds and rales present.      Comments: Rales present diffusely bilateral lungs. Expiratory wheeze present Skin:    General: Skin is warm.  Neurological:     General: No focal deficit present.     Mental Status: She is alert and oriented to person, place, and time.  Psychiatric:        Attention and Perception: Attention normal.        Mood and Affect: Mood normal.        Speech: Speech normal.      UC Treatments / Results  Labs (all labs ordered are listed, but only abnormal results are displayed) Labs Reviewed  NOVEL CORONAVIRUS, NAA    EKG   Radiology DG Chest 2 View  Result Date: 04/03/2020 CLINICAL DATA:  55 year old female with cough for 1 week. Shortness of breath and hypoxia. EXAM: CHEST - 2 VIEW COMPARISON:  Chest CT 09/19/2019 and earlier. FINDINGS: PA and lateral views of the chest. Lung volumes and mediastinal contours remain stable. Chronic bilateral increased pulmonary interstitial opacity, but multifocal and confluent new asymmetric patchy and reticulonodular opacity in both lungs. No superimposed pneumothorax or pleural effusion. Visualized tracheal air column is within normal limits. No acute osseous abnormality identified. Negative visible bowel gas pattern. IMPRESSION: Acute bilateral patchy and confluent opacity superimposed on chronic interstitial lung changes. Favor respiratory infection and this appearance is typical  of COVID-19 pneumonia. Electronically Signed   By: Genevie Ann M.D.   On: 04/03/2020 10:51    Procedures Procedures (including critical care time)  Medications Ordered in UC Medications - No data to display  Initial Impression / Assessment and Plan / UC Course  I have reviewed the triage vital signs and the nursing notes.  Pertinent labs & imaging results that were available during my care of the patient were reviewed by me and considered in my medical decision making (see chart for details).    Patient being evaluated for cough with accompanying URI symptoms high  suspicion for Covid.  On arrival oxygen level was 93 to 94% chest x-ray today revealed multifocal pneumonia.  Patient is unvaccinated against COVID-19 and has breast cancer with bone mets.  Imaging concerning for possible COVID-19. Treating patient with azithromycin, prednisone, and benzonatate as needed for cough.  Also dispensed an albuterol inhaler with 2 puffs in her chamber for use to manage symptoms of shortness of breath and wheezing.  Patient advised to continue to monitor oxygen level at home if oxygen levels dropped below 89% and are sustained she is to go immediately to the ER she is high risk with her associated conditions for complications related to a COVID-19 viral infection.  Patient verbalized understanding and plan. Final Clinical Impressions(s) / UC Diagnoses   Final diagnoses:  Encounter for screening for COVID-19  Multifocal pneumonia     Discharge Instructions     Chest x-ray shows multifocal pneumonia which is consistent with that of a viral pneumonia.  This is typically seen in patients are positive for COVID however will not have your Covid test results for the next 3 to 5 days.  Given this finding I would recommend masking when out in public.  While at home monitor your oxygen level if your oxygen levels drop below 89% and are stated at that rate you need to go immediately to the ER for further evaluation. Start azithromycin today and take as directed. I am also treating you with a low-dose of steroid as you do have diabetes this can cause your glucose to become elevated therefore monitor your blood sugars. For shortness of breath or wheezing start albuterol inhaler 2 puffs every 4-6 hours as needed.  Use the chamber to help facilitate appropriate delivery of the medication. I have also sent over benzonatate for management of your cough.  Recommend rest and hydration.  If you develop any severe chest pain and rapid heart rate go immediately to the ER.    ED Prescriptions     Medication Sig Dispense Auth. Provider   azithromycin (ZITHROMAX) 250 MG tablet Take 2 tabs PO x 1 dose, then 1 tab PO QD x 4 days 6 tablet Scot Jun, FNP   predniSONE (DELTASONE) 20 MG tablet Take 1 tablet (20 mg total) by mouth daily with breakfast for 5 days. 5 tablet Scot Jun, FNP   benzonatate (TESSALON) 100 MG capsule Take 2 capsules (200 mg total) by mouth 3 (three) times daily as needed for cough. 40 capsule Scot Jun, FNP     PDMP not reviewed this encounter.   Scot Jun, FNP 04/03/20 1128

## 2020-04-04 ENCOUNTER — Telehealth: Payer: Self-pay | Admitting: Hematology and Oncology

## 2020-04-04 LAB — NOVEL CORONAVIRUS, NAA: SARS-CoV-2, NAA: DETECTED — AB

## 2020-04-04 LAB — SARS-COV-2, NAA 2 DAY TAT

## 2020-04-04 NOTE — Telephone Encounter (Signed)
Scheduled appt per 2/10 sch msg - pt is aware of appt date and time

## 2020-04-07 ENCOUNTER — Telehealth: Payer: Self-pay

## 2020-04-07 DIAGNOSIS — C7951 Secondary malignant neoplasm of bone: Secondary | ICD-10-CM

## 2020-04-07 NOTE — Telephone Encounter (Signed)
Pt's CT scan has been scheduled for 3/7 and labs on 04/25/19 Pt will pick up contrast on this date. Upon speaking with Pt she tested covid positive on 04/05/19 Pt. Should be clear by these dates to take her test. Pt. Aware of appointment date and times.

## 2020-04-11 ENCOUNTER — Encounter: Payer: Self-pay | Admitting: Internal Medicine

## 2020-04-11 ENCOUNTER — Ambulatory Visit: Payer: Medicaid Other | Admitting: Internal Medicine

## 2020-04-11 ENCOUNTER — Other Ambulatory Visit: Payer: Self-pay

## 2020-04-11 VITALS — BP 110/64 | HR 102 | Ht 59.5 in | Wt 143.0 lb

## 2020-04-11 DIAGNOSIS — R002 Palpitations: Secondary | ICD-10-CM

## 2020-04-11 DIAGNOSIS — I779 Disorder of arteries and arterioles, unspecified: Secondary | ICD-10-CM

## 2020-04-11 DIAGNOSIS — I10 Essential (primary) hypertension: Secondary | ICD-10-CM

## 2020-04-11 NOTE — Progress Notes (Signed)
HPI Ms. Dulski returns today for followup. She has a remote h/o SVT and now HTN and increasing weight gain. She has been diagnosed with metastatic breast CA. She is on chemo therapy. She notes that she has not had any real fast heart racing although she does have sinus tachycardia. She had Covid a few weeks ago and has slowly gotten better. She notes that the pulse in her right neck is less prominent than in her left neck. Allergies  Allergen Reactions  . Doxycycline Shortness Of Breath    Wheezing, shortness of breath, rash head to toe, and swelling  . Naproxen Shortness Of Breath  . Codeine Hives  . Hydrocodone-Acetaminophen Hives  . Ibuprofen     Irritant to stomach r/t diverticulitis  . Lantus [Insulin Glargine]     Yeast Infections  . Meloxicam Other (See Comments)    Abdominal pain   . Propoxyphene N-Acetaminophen Hives  . Sulfonamide Derivatives Hives  . Tramadol Other (See Comments)    Abdominal Pain     Current Outpatient Medications  Medication Sig Dispense Refill  . Accu-Chek FastClix Lancets MISC USE TO TEST BLOOD SUGAR UP TO FOUR TIMES DAILY AS DIRECTED 204 each 2  . ACCU-CHEK GUIDE test strip USE TO CHECK BLOOD SUGAR UP TO 4 TIMES A DAY. DX: E11.65 200 strip 2  . Alpha-Lipoic Acid 200 MG CAPS Take 200 mg by mouth daily.    Marland Kitchen azithromycin (ZITHROMAX) 250 MG tablet Take 2 tabs PO x 1 dose, then 1 tab PO QD x 4 days 6 tablet 0  . BD INSULIN SYRINGE U/F 31G X 5/16" 0.5 ML MISC USE TO ADMINISTER INSULIN 3 TIMES DAILY WITH MEALS 300 each 2  . benzonatate (TESSALON) 100 MG capsule Take 2 capsules (200 mg total) by mouth 3 (three) times daily as needed for cough. 40 capsule 0  . clindamycin (CLEOCIN) 300 MG capsule Take 1 capsule (300 mg total) by mouth 3 (three) times daily. 21 capsule 0  . diclofenac (VOLTAREN) 75 MG EC tablet Take 75 mg by mouth 2 (two) times daily as needed.    . doxycycline (VIBRA-TABS) 100 MG tablet Take 1 tablet (100 mg total) by mouth 2 (two)  times daily. 14 tablet 0  . ferrous sulfate 325 (65 FE) MG tablet Take 325 mg by mouth daily with breakfast.    . gabapentin (NEURONTIN) 100 MG capsule Take 3 capsules (300 mg total) by mouth 3 (three) times daily. 90 capsule 1  . IBRANCE 125 MG tablet TAKE 1 TABLET (125 MG TOTAL) BY MOUTH DAILY. TAKE FOR 21 DAYS ON, 7 DAYS OFF, REPEAT EVERY 28 DAYS. 21 tablet 3  . insulin NPH Human (HUMULIN N) 100 UNIT/ML injection Inject 0.12 mLs (12 Units total) into the skin at bedtime. 10 mL 1  . insulin regular (HUMULIN R) 100 units/mL injection Inject 0.12 mLs (12 Units total) into the skin 3 (three) times daily before meals. E.11.9. Hold dose for blood sugar less than 125 10 mL 11  . letrozole (FEMARA) 2.5 MG tablet TAKE 1 TABLET BY MOUTH EVERY DAY 90 tablet 3  . Loratadine (CLARITIN PO) Take by mouth.    . metoprolol succinate (TOPROL-XL) 25 MG 24 hr tablet TAKE 1 TABLET BY MOUTH EVERY DAY 90 tablet 3  . Milk Thistle 1000 MG CAPS Take 1,000 mg by mouth daily.    . mirabegron ER (MYRBETRIQ) 50 MG TB24 tablet Take 1 tablet (50 mg total) by mouth daily. [place]  tablet 11  . Multiple Vitamin (MULTIVITAMIN WITH MINERALS) TABS tablet Take 1 tablet by mouth daily.    . SUPER B COMPLEX/C PO Take 1 tablet by mouth daily.    . Vitamin D, Ergocalciferol, (DRISDOL) 1.25 MG (50000 UNIT) CAPS capsule Take 1 capsule by mouth once a week.     No current facility-administered medications for this visit.     Past Medical History:  Diagnosis Date  . Anxiety state, unspecified   . Arthritis   . Breast cancer of lower-inner quadrant of right female breast ([place])   . Carpal tunnel syndrome   . Complication of anesthesia    woke up during ganglion cyst removal in her 20's, not woken up since  . Diabetes mellitus without complication ([place])    Type II  . Heart murmur   . History of radiation therapy 12/18/18- 01/30/19   Right Chest wall 25 fractions X 2Gy each to total 50 Gy, followed by a boost 10 Gy in 5 fractions.   .  Hyperlipidemia   . Hyperthyroidism   . Mitral valve prolapse   . Smoker   . Tachycardia   . 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   Past Surgical History:  Procedure Laterality Date  . ABDOMINAL HYSTERECTOMY    . APPENDECTOMY    . CARPAL TUNNEL RELEASE    . GANGLION CYST EXCISION Right   . KNEE SURGERY Right 2007   ACL repair  . TOTAL MASTECTOMY Right 11/16/2018   Procedure: RIGHT MASTECTOMY;  Surgeon: Coralie Keens, MD;  Location: [facility];  Service: General;  Laterality: Right;  . TUBAL LIGATION       Family History  Problem Relation Age of Onset  . Diabetes Mother   . Bladder Cancer Mother 38       smoker  . Other Mother 43       TAH for unspecified reason  . Multiple sclerosis Mother   . Cancer Father 49       lymphatic/tonsil cancer - in remission; former smoker  . COPD Maternal Grandmother        smoker  . Emphysema Maternal Grandmother        smoker  . Stroke Maternal Grandfather   . Dementia Maternal Uncle   . Diabetes Paternal Grandmother   . COPD Maternal Uncle        smoker  . Multiple sclerosis Paternal Aunt   . Breast cancer Paternal Aunt        dx. late 10s - early 71s     Social History   Socioeconomic History  . Marital status: Divorced    Spouse name: Not on file  . Number of children: Not on file  . Years of education: Not on file  . Highest education level: Not on file  Occupational History  . Occupation: Scientist, water quality  Tobacco Use  . Smoking status: Former Smoker    Packs/day: 3.00    Years: 30.00    Pack years: 90.00    Types: Cigarettes    Quit date: 10/24/2011    Years since quitting: 8.4  . Smokeless tobacco: Never Used  Vaping Use  . Vaping Use: Never used  Substance and Sexual Activity  . Alcohol use: No  . Drug use: Never    Comment: updated as of 09/06/2018 - pt reports no illegal drug  use   . Sexual activity: Not Currently  Other Topics Concern  . Not on file  Social History Narrative   In [place] w/boyfriend   Works Chiropractor, Scientist, water quality   Social Determinants of Radio broadcast assistant Strain: Not on [employer] Insecurity: Not on file  Transportation Needs: Not on file  Physical Activity: Not on file  Stress: Not on file  Social Connections: Not on file  Intimate Partner Violence: Not on file     BP 110/64   Pulse (!) 102   Ht 4' 11.5" (1.511 m)   Wt 143 lb (64.9 kg)   SpO2 96%   BMI 28.40 kg/m   Physical Exam:  Well appearing NAD HEENT: Unremarkable Neck:  No JVD, no thyromegally; right neck pulse is diminished but there is no obvious bruit. Lymphatics:  No adenopathy Back:  No CVA tenderness Lungs:  Clear HEART:  Regular rate rhythm, no murmurs, no rubs, no clicks Abd:  soft, positive bowel sounds, no organomegally, no rebound, no guarding Ext:  2 plus pulses, no edema, no cyanosis, no clubbing Skin:  No rashes no nodules Neuro:  CN II through XII intact, motor grossly intact  EKG =- sinus tachycardia  Assess/Plan: 1. SVT - she has not had any.  2. Decreased right carotid pulse - she will obtain a duplex doppler to eval. 3. HTN - her bp is controlled. We will follow. She will continue the toprol.  Carleene Overlie Aspen Deterding,MD

## 2020-04-11 NOTE — Patient Instructions (Addendum)
Medication Instructions:  Your physician recommends that you continue on your current medications as directed. Please refer to the Current Medication list given to you today.  Labwork: None ordered.  Testing/Procedures: Your physician has requested that you have a carotid duplex. This test is an ultrasound of the carotid arteries in your neck. It looks at blood flow through these arteries that supply the brain with blood. Allow one hour for this exam. There are no restrictions or special instructions.  Please schedule for carotid ultrasound of neck at [place] office  Follow-Up: Your physician wants you to follow-up in: one year with Cristopher Peru, MD or one of the following Advanced Practice Providers on your designated Care Team:    Chanetta Marshall, NP  Tommye Standard, PA-C  Legrand Como "Jonni Sanger" Chalmers Cater, [place]   Any Other Special Instructions Will Be Listed Below (If Applicable).  If you need a refill on your cardiac medications before your next appointment, please call your pharmacy.

## 2020-04-18 ENCOUNTER — Ambulatory Visit (HOSPITAL_COMMUNITY)
Admission: RE | Admit: 2020-04-18 | Discharge: 2020-04-18 | Disposition: A | Discharge status: Home / Self Care | Payer: Medicaid Other | Source: Ambulatory Visit | Attending: Cardiology | Admitting: Cardiology

## 2020-04-18 ENCOUNTER — Other Ambulatory Visit: Payer: Self-pay

## 2020-04-18 DIAGNOSIS — I779 Disorder of arteries and arterioles, unspecified: Secondary | ICD-10-CM | POA: Diagnosis not present

## 2020-04-21 MED FILL — IBRANCE 125 MG TABS: 125 | 28 days supply | Qty: 21 | Fill #2

## 2020-04-24 ENCOUNTER — Inpatient Hospital Stay: Payer: Medicaid Other | Attending: Medical

## 2020-04-24 ENCOUNTER — Other Ambulatory Visit: Payer: Self-pay

## 2020-04-24 DIAGNOSIS — Z923 Personal history of irradiation: Secondary | ICD-10-CM | POA: Diagnosis not present

## 2020-04-24 DIAGNOSIS — C50311 Malignant neoplasm of lower-inner quadrant of right female breast: Secondary | ICD-10-CM | POA: Insufficient documentation

## 2020-04-24 DIAGNOSIS — Z170 Estrogen receptor positive status [ER+]: Secondary | ICD-10-CM | POA: Diagnosis not present

## 2020-04-24 DIAGNOSIS — C7951 Secondary malignant neoplasm of bone: Secondary | ICD-10-CM | POA: Diagnosis not present

## 2020-04-24 DIAGNOSIS — Z9011 Acquired absence of right breast and nipple: Secondary | ICD-10-CM | POA: Diagnosis not present

## 2020-04-24 LAB — CMP (CANCER CENTER ONLY)
ALT: 14 U/L (ref 0–44)
AST: 12 U/L — ABNORMAL LOW (ref 15–41)
Albumin: 3.3 g/dL — ABNORMAL LOW (ref 3.5–5.0)
Alkaline Phosphatase: 76 U/L (ref 38–126)
Anion gap: 8 (ref 5–15)
BUN: 18 mg/dL (ref 6–20)
CO2: 22 mmol/L (ref 22–32)
Calcium: 9.2 mg/dL (ref 8.9–10.3)
Chloride: 108 mmol/L (ref 98–111)
Creatinine: 0.71 mg/dL (ref 0.44–1.00)
GFR, Estimated: >60 mL/min (ref >60)
Glucose, Bld: 143 mg/dL — ABNORMAL HIGH (ref 70–99)
Potassium: 3.9 mmol/L (ref 3.5–5.1)
Sodium: 138 mmol/L (ref 135–145)
Total Bilirubin: 0.4 mg/dL (ref 0.3–1.2)
Total Protein: 7 g/dL (ref 6.5–8.1)

## 2020-04-24 LAB — CBC WITH DIFFERENTIAL (CANCER CENTER ONLY)
Abs Immature Granulocytes: 0.02 10*3/uL (ref 0.00–0.07)
Basophils Absolute: 0.1 10*3/uL (ref 0.0–0.1)
Basophils Relative: 1 %
Eosinophils Absolute: 0.3 10*3/uL (ref 0.0–0.5)
Eosinophils Relative: 5 %
HCT: 39 % (ref 36.0–46.0)
Hemoglobin: 13.1 g/dL (ref 12.0–15.0)
Immature Granulocytes: 0 %
Lymphocytes Relative: 32 %
Lymphs Abs: 1.8 10*3/uL (ref 0.7–4.0)
MCH: 32 pg (ref 26.0–34.0)
MCHC: 33.6 g/dL (ref 30.0–36.0)
MCV: 95.1 fL (ref 80.0–100.0)
Monocytes Absolute: 0.7 10*3/uL (ref 0.1–1.0)
Monocytes Relative: 12 %
Neutro Abs: 2.8 10*3/uL (ref 1.7–7.7)
Neutrophils Relative %: 50 %
Platelet Count: 298 10*3/uL (ref 150–400)
RBC: 4.1 MIL/uL (ref 3.87–5.11)
RDW: 12.6 % (ref 11.5–15.5)
WBC Count: 5.6 10*3/uL (ref 4.0–10.5)
nRBC: 0 % (ref 0.0–0.2)

## 2020-04-28 ENCOUNTER — Encounter (HOSPITAL_COMMUNITY): Payer: Self-pay

## 2020-04-28 ENCOUNTER — Telehealth: Payer: Self-pay | Admitting: Internal Medicine

## 2020-04-28 ENCOUNTER — Other Ambulatory Visit: Payer: Self-pay

## 2020-04-28 ENCOUNTER — Ambulatory Visit (HOSPITAL_COMMUNITY)
Admission: RE | Admit: 2020-04-28 | Discharge: 2020-04-28 | Disposition: A | Discharge status: Home / Self Care | Payer: Medicaid Other | Source: Ambulatory Visit | Attending: Hematology and Oncology | Admitting: Hematology and Oncology

## 2020-04-28 DIAGNOSIS — I2510 Atherosclerotic heart disease of native coronary artery without angina pectoris: Secondary | ICD-10-CM | POA: Diagnosis not present

## 2020-04-28 DIAGNOSIS — J432 Centrilobular emphysema: Secondary | ICD-10-CM | POA: Diagnosis not present

## 2020-04-28 DIAGNOSIS — Z170 Estrogen receptor positive status [ER+]: Secondary | ICD-10-CM | POA: Diagnosis not present

## 2020-04-28 DIAGNOSIS — C7951 Secondary malignant neoplasm of bone: Secondary | ICD-10-CM | POA: Diagnosis not present

## 2020-04-28 DIAGNOSIS — C50919 Malignant neoplasm of unspecified site of unspecified female breast: Secondary | ICD-10-CM | POA: Diagnosis not present

## 2020-04-28 DIAGNOSIS — C50311 Malignant neoplasm of lower-inner quadrant of right female breast: Secondary | ICD-10-CM | POA: Diagnosis not present

## 2020-04-28 DIAGNOSIS — I700 Atherosclerosis of aorta: Secondary | ICD-10-CM | POA: Diagnosis not present

## 2020-04-28 DIAGNOSIS — K5750 Diverticulosis of both small and large intestine without perforation or abscess without bleeding: Secondary | ICD-10-CM | POA: Diagnosis not present

## 2020-04-28 DIAGNOSIS — K760 Fatty (change of) liver, not elsewhere classified: Secondary | ICD-10-CM | POA: Diagnosis not present

## 2020-04-28 MED ORDER — IOHEXOL 300 MG/ML  SOLN
100.0000 mL | Freq: Once | INTRAMUSCULAR | Status: AC | PRN
  Administered 2020-04-28: 100 mL via INTRAVENOUS

## 2020-04-28 NOTE — Telephone Encounter (Signed)
Pt called in returning call for vascular results   Pt stated it is ok to leave a message  Best number [phone]

## 2020-04-30 ENCOUNTER — Other Ambulatory Visit: Payer: Self-pay | Admitting: Hematology and Oncology

## 2020-04-30 NOTE — Progress Notes (Signed)
Patient Care Team: [name], DO as PCP - General (Family Medicine) [name], RN as Case Manager  DIAGNOSIS:    ICD-10-CM   1. Malignant neoplasm of lower-inner quadrant of right breast of female, estrogen receptor positive ([place])  C50.311    Z17.0     SUMMARY OF ONCOLOGIC HISTORY: Oncology History  Malignant neoplasm of lower-inner quadrant of right breast of female, estrogen receptor positive ([place])  02/19/2015 Mammogram   Right breast irregular mass lower inner quadrant 2.2 x 1.3 x 1.7 cm with architectural distortion and skin/nipple retraction, T2 N0 stage II a clinical stage, additional benign cysts largest 1.2 cm   02/20/2015 Initial Diagnosis   Right breast biopsy 5:00: Invasive ductal carcinoma grade 2, perineural invasion present, ER 90%, PR 70%, HER-2 negative ratio 0.95, Ki-67 15%    03/07/2015 Breast MRI   right breast inferior subareolar mass measuring 2.2 x 2.4 x 2.5 cm with skin and nipple enhancement, extending inferiorly and laterally is intraductal enhancement over a distance of 4 cm, several level I right axillary lymph nodes with cortical thickening   03/13/2015 -  Anti-estrogen oral therapy   Neoadjuvant antiestrogen therapy with tamoxifen 20 mg daily (because the patient did not want to undergo surgery immediately for multiple family reasons) stopped in late 2017; anastrozole started 05/11/18   05/23/2018 Imaging   CT CAP: 2.9 cm spiculated soft tissue density central right breast, no evidence of soft tissue metastatic disease within the chest abdomen pelvis.  Subcentimeter sclerotic bone lesions T10, left pubis, right posterior ilium Bone scan: Foci of uptake left frontal calvarium, T-spine, right ilium   10/04/2018 -  Anti-estrogen oral therapy   Ibrance with anastrozole   11/16/2018 Surgery   Right mastectomy Ninfa [facility]): IDC, grade 1, 5.2cm, grossly involving underlying skin, clear margins.   12/07/2018 Cancer Staging   Staging form:  Breast, AJCC 7th Edition - Pathologic stage from 12/07/2018: Stage IV (yT4b, NX, M1) - Signed by Eppie Gibson, MD on 12/07/2018   12/19/2018 - 01/30/2019 Radiation Therapy   Adjuvant radiation   05/16/2019 Miscellaneous   [facility] molecular testing: ER positive, PI K3 CA mutation present, HER-2 negative, ER positive TMB low BRCA1 not detected, ESR 1 not detected, PD-L1 negative     CHIEF COMPLIANT: Follow-up of metastatic breaston Ibrance and anastrozole  INTERVAL HISTORY: Emily Wilson is a 55 y.o. with above-mentioned history of metastatic breast cancer currently on anastrozoleand Ibrance. CT CAP on 04/28/20 showed mixed lytic and sclerotic osseous metastases throughout the skeleton with increased lytic character concerning for worsening osseous metastatic disease. She presents to the clinic todayfora toxicity checkand to review her scan. She had been prone to develop pneumonias every year since she was 55 years old.  ALLERGIES:  is allergic to doxycycline, naproxen, codeine, hydrocodone-acetaminophen, ibuprofen, lantus [insulin glargine], meloxicam, propoxyphene n-acetaminophen, sulfonamide derivatives, and tramadol.  MEDICATIONS:  Current Outpatient Medications  Medication Sig Dispense Refill  . Accu-Chek FastClix Lancets MISC USE TO TEST BLOOD SUGAR UP TO FOUR TIMES DAILY AS DIRECTED 204 each 2  . ACCU-CHEK GUIDE test strip USE TO CHECK BLOOD SUGAR UP TO 4 TIMES A DAY. DX: E11.65 200 strip 2  . Alpha-Lipoic Acid 200 MG CAPS Take 200 mg by mouth daily.    . BD INSULIN SYRINGE U/F 31G X 5/16" 0.5 ML MISC USE TO ADMINISTER INSULIN 3 TIMES DAILY WITH MEALS 300 each 2  . diclofenac (VOLTAREN) 75 MG EC tablet Take 75 mg by mouth 2 (two) times  daily as needed.    . ferrous sulfate 325 (65 FE) MG tablet Take 325 mg by mouth daily with breakfast.    . gabapentin (NEURONTIN) 100 MG capsule Take 3 capsules (300 mg total) by mouth 3 (three) times daily. 90 capsule 1  . IBRANCE 125 MG tablet  TAKE 1 TABLET (125 MG TOTAL) BY MOUTH DAILY. TAKE FOR 21 DAYS ON, 7 DAYS OFF, REPEAT EVERY 28 DAYS. 21 tablet 3  . insulin NPH Human (HUMULIN N) 100 UNIT/ML injection Inject 0.12 mLs (12 Units total) into the skin at bedtime. 10 mL 1  . insulin regular (HUMULIN R) 100 units/mL injection Inject 0.12 mLs (12 Units total) into the skin 3 (three) times daily before meals. E.11.9. Hold dose for blood sugar less than 125 10 mL 11  . letrozole (FEMARA) 2.5 MG tablet TAKE 1 TABLET BY MOUTH EVERY DAY 90 tablet 0  . Loratadine (CLARITIN PO) Take by mouth.    . metoprolol succinate (TOPROL-XL) 25 MG 24 hr tablet TAKE 1 TABLET BY MOUTH EVERY DAY 90 tablet 3  . Milk Thistle 1000 MG CAPS Take 1,000 mg by mouth daily.    . mirabegron ER (MYRBETRIQ) 50 MG TB24 tablet Take 1 tablet (50 mg total) by mouth daily. 30 tablet 11  . Multiple Vitamin (MULTIVITAMIN WITH MINERALS) TABS tablet Take 1 tablet by mouth daily.    . SUPER B COMPLEX/C PO Take 1 tablet by mouth daily.    . Vitamin D, Ergocalciferol, (DRISDOL) 1.25 MG (50000 UNIT) CAPS capsule Take 1 capsule by mouth once a week.     No current facility-administered medications for this visit.    PHYSICAL EXAMINATION: ECOG PERFORMANCE STATUS: 1 - Symptomatic but completely ambulatory  Vitals:   05/01/20 1053  BP: 122/73  Pulse: 88  Resp: 18  Temp: 98.1 F (36.7 C)  SpO2: 99%   Filed Weights   05/01/20 1053  Weight: 153 lb 11.2 oz (69.7 kg)     LABORATORY DATA:  I have reviewed the data as listed CMP Latest Ref Rng & Units 04/24/2020 02/01/2020 11/02/2019  Glucose 70 - 99 mg/dL 143(H) 198(H) 150(H)  BUN 6 - 20 mg/dL '18 18 13  ' Creatinine 0.44 - 1.00 mg/dL 0.71 0.83 0.75  Sodium 135 - 145 mmol/L 138 137 137  Potassium 3.5 - 5.1 mmol/L 3.9 4.5 4.2  Chloride 98 - 111 mmol/L 108 103 104  CO2 22 - 32 mmol/L '22 26 24  ' Calcium 8.9 - 10.3 mg/dL 9.2 10.4(H) 9.9  Total Protein 6.5 - 8.1 g/dL 7.0 7.3 7.4  Total Bilirubin 0.3 - 1.2 mg/dL 0.4 0.5 0.5   Alkaline Phos 38 - 126 U/L 76 90 77  AST 15 - 41 U/L 12(L) 20 25  ALT 0 - 44 U/L '14 20 29    ' Lab Results  Component Value Date   WBC 5.6 04/24/2020   HGB 13.1 04/24/2020   HCT 39.0 04/24/2020   MCV 95.1 04/24/2020   PLT 298 04/24/2020   NEUTROABS 2.8 04/24/2020    ASSESSMENT & PLAN:  Malignant neoplasm of lower-inner quadrant of right breast of female, estrogen receptor positive ([place]) Right breast irregular mass lower inner quadrant retroareolar 02/19/2015: 2.2 x 1.3 x 1.7 cm with architectural distortion and skin/nipple retraction, T2 N0 stage II a clinical stage, additional benign cysts largest 1.2 cm Right breast biopsy 02/20/2015 5:00: Invasive ductal carcinoma grade 2, perineural invasion present, ER 90%, PR 70%, HER-2 negative ratio 0.95, Ki-67 15%   Breast  MRI 03/07/2015: Subareolar mass 2.2 x 2.4 x 2.5 cm extending into the retracted nipple, extending inferiorly and laterally over a distance of 4 cm, several level I right axillary lymph nodes with cortical thickening up to 7 mm with a maximum diameter of 15 mm  05/16/2019:[facility] molecular testing: ER positive, PI K3 CA mutation present, HER-2 negative, ER positive TMB low BRCA1 not detected, ESR 1 not detected, PD-L1 negative --------------------------------------------------------------------- Treatment summary:Anastrozole with Delton See started April 2020Ibrance added 09/29/2018 Bone scan: 09/20/2018: Progression of bone metastatic disease new metastases disease right lesser trochanter, mid left femoral diaphysis, additional bone mets involving left frontal, right iliac, right SI joint, right sixth rib, 12th rib, T12 transverse process and T10  Ibrancetoxicities:No adverse effectspreviously She resumedIbrance February 23, 2019  11/16/2018:Right mastectomy ([facility])performed because the tumor was fungating: IDC, grade 1, 5.2cm, grossly involving underlying skin, clear margins.  Radiation to chest wall: 12/19/18-  01/30/19 Bone scan 06/26/2019: Stable distribution of bone metastases. CT CAP 04/28/2020: Mixed lytic and sclerotic bone metastases several demonstrating increased lytic character concern for worsening bone mets.  Radiology review: I discussed that the CT scan shows some increased activity in the bone metastases however it is not diagnostic for progression.  Therefore I would like her to remain on the same treatment.  Plan: Continuation of current treatment    recheck CT scans in 6 months.  Xgeva labs and follow-up every 3 months    No orders of the defined types were placed in this encounter.  The patient has a good understanding of the overall plan. she agrees with it. she will call with any problems that may develop before the next visit here.  Total time spent: 30 mins including face to face time and time spent for planning, charting and coordination of care  Rulon Eisenmenger, MD, MPH 05/01/2020  I, Molly Dorshimer, am acting as scribe for Dr. [name].  I have reviewed the above documentation for accuracy and completeness, and I agree with the above.

## 2020-04-30 NOTE — Telephone Encounter (Signed)
Returned call to Pt.  Advised of results.

## 2020-04-30 NOTE — Telephone Encounter (Signed)
Patient calling back for test results

## 2020-05-01 ENCOUNTER — Other Ambulatory Visit: Payer: Self-pay

## 2020-05-01 ENCOUNTER — Inpatient Hospital Stay: Payer: Medicaid Other

## 2020-05-01 ENCOUNTER — Inpatient Hospital Stay (HOSPITAL_BASED_OUTPATIENT_CLINIC_OR_DEPARTMENT_OTHER): Payer: Medicaid Other | Admitting: Hematology and Oncology

## 2020-05-01 ENCOUNTER — Telehealth: Payer: Self-pay | Admitting: Hematology and Oncology

## 2020-05-01 ENCOUNTER — Other Ambulatory Visit: Payer: Medicaid Other

## 2020-05-01 DIAGNOSIS — Z9011 Acquired absence of right breast and nipple: Secondary | ICD-10-CM | POA: Diagnosis not present

## 2020-05-01 DIAGNOSIS — Z170 Estrogen receptor positive status [ER+]: Secondary | ICD-10-CM | POA: Diagnosis not present

## 2020-05-01 DIAGNOSIS — C7951 Secondary malignant neoplasm of bone: Secondary | ICD-10-CM | POA: Diagnosis not present

## 2020-05-01 DIAGNOSIS — Z923 Personal history of irradiation: Secondary | ICD-10-CM | POA: Diagnosis not present

## 2020-05-01 DIAGNOSIS — C50311 Malignant neoplasm of lower-inner quadrant of right female breast: Secondary | ICD-10-CM | POA: Diagnosis not present

## 2020-05-01 MED ORDER — DENOSUMAB 120 MG/1.7ML ~~LOC~~ SOLN
120.0000 mg | Freq: Once | SUBCUTANEOUS | Status: AC
  Administered 2020-05-01: 120 mg via SUBCUTANEOUS

## 2020-05-01 MED ORDER — DENOSUMAB 120 MG/1.7ML ~~LOC~~ SOLN
SUBCUTANEOUS | Status: AC
  Filled 2020-05-01: qty 1.7

## 2020-05-01 NOTE — Telephone Encounter (Signed)
Scheduled appts per 3/10 los. Gave pt a print out of AVS.  

## 2020-05-01 NOTE — Assessment & Plan Note (Signed)
Right breast irregular mass lower inner quadrant retroareolar 02/19/2015: 2.2 x 1.3 x 1.7 cm with architectural distortion and skin/nipple retraction, T2 N0 stage II a clinical stage, additional benign cysts largest 1.2 cm Right breast biopsy 02/20/2015 5:00: Invasive ductal carcinoma grade 2, perineural invasion present, ER 90%, PR 70%, HER-2 negative ratio 0.95, Ki-67 15%   Breast MRI 03/07/2015: Subareolar mass 2.2 x 2.4 x 2.5 cm extending into the retracted nipple, extending inferiorly and laterally over a distance of 4 cm, several level I right axillary lymph nodes with cortical thickening up to 7 mm with a maximum diameter of 15 mm  05/16/2019:[facility] molecular testing: ER positive, PI K3 CA mutation present, HER-2 negative, ER positive TMB low BRCA1 not detected, ESR 1 not detected, PD-L1 negative --------------------------------------------------------------------- Treatment summary:Anastrozole with Delton See started April 2020Ibrance added 09/29/2018 Bone scan: 09/20/2018: Progression of bone metastatic disease new metastases disease right lesser trochanter, mid left femoral diaphysis, additional bone mets involving left frontal, right iliac, right SI joint, right sixth rib, 12th rib, T12 transverse process and T10  Ibrancetoxicities:No adverse effectspreviously She resumedIbrance February 23, 2019  11/16/2018:Right mastectomy ([facility])performed because the tumor was fungating: IDC, grade 1, 5.2cm, grossly involving underlying skin, clear margins.  Radiation to chest wall: 12/19/18- 01/30/19 Bone scan 06/26/2019: Stable distribution of bone metastases. CT CAP 09/19/2019: Increasing sclerosis in the bone metastases this may reflect treatment related change.  No new lesions.  Colonic diverticulosis with diverticular disease.  Plan: Continuation of current treatment Patient will see [facility] gastroenterology regarding the diverticular issues.  She will need a colonoscopy.  recheck CT scans in  3 months.  Xgeva labs and follow-up after the scan

## 2020-05-01 NOTE — Patient Instructions (Signed)
Denosumab injection What is this medicine? DENOSUMAB (den oh sue mab) slows bone breakdown. Prolia is used to treat osteoporosis in women after menopause and in men, and in people who are taking corticosteroids for 6 months or more. Xgeva is used to treat a high calcium level due to cancer and to prevent bone fractures and other bone problems caused by multiple myeloma or cancer bone metastases. Xgeva is also used to treat giant cell tumor of the bone. This medicine may be used for other purposes; ask your health care provider or pharmacist if you have questions. COMMON BRAND NAME(S): Prolia, XGEVA What should I tell my health care provider before I take this medicine? They need to know if you have any of these conditions:  dental disease  having surgery or tooth extraction  infection  kidney disease  low levels of calcium or Vitamin D in the blood  malnutrition  on hemodialysis  skin conditions or sensitivity  thyroid or parathyroid disease  an unusual reaction to denosumab, other medicines, foods, dyes, or preservatives  pregnant or trying to get pregnant  breast-feeding How should I use this medicine? This medicine is for injection under the skin. It is given by a health care professional in a hospital or clinic setting. A special MedGuide will be given to you before each treatment. Be sure to read this information carefully each time. For Prolia, talk to your pediatrician regarding the use of this medicine in children. Special care may be needed. For Xgeva, talk to your pediatrician regarding the use of this medicine in children. While this drug may be prescribed for children as young as 13 years for selected conditions, precautions do apply. Overdosage: If you think you have taken too much of this medicine contact a poison control center or emergency room at once. NOTE: This medicine is only for you. Do not share this medicine with others. What if I miss a dose? It is  important not to miss your dose. Call your doctor or health care professional if you are unable to keep an appointment. What may interact with this medicine? Do not take this medicine with any of the following medications:  other medicines containing denosumab This medicine may also interact with the following medications:  medicines that lower your chance of fighting infection  steroid medicines like prednisone or cortisone This list may not describe all possible interactions. Give your health care provider a list of all the medicines, herbs, non-prescription drugs, or dietary supplements you use. Also tell them if you smoke, drink alcohol, or use illegal drugs. Some items may interact with your medicine. What should I watch for while using this medicine? Visit your doctor or health care professional for regular checks on your progress. Your doctor or health care professional may order blood tests and other tests to see how you are doing. Call your doctor or health care professional for advice if you get a fever, chills or sore throat, or other symptoms of a cold or flu. Do not treat yourself. This drug may decrease your body's ability to fight infection. Try to avoid being around people who are sick. You should make sure you get enough calcium and vitamin D while you are taking this medicine, unless your doctor tells you not to. Discuss the foods you eat and the vitamins you take with your health care professional. See your dentist regularly. Brush and floss your teeth as directed. Before you have any dental work done, tell your dentist you are   receiving this medicine. Do not become pregnant while taking this medicine or for 5 months after stopping it. Talk with your doctor or health care professional about your birth control options while taking this medicine. Women should inform their doctor if they wish to become pregnant or think they might be pregnant. There is a potential for serious side  effects to an unborn child. Talk to your health care professional or pharmacist for more information. What side effects may I notice from receiving this medicine? Side effects that you should report to your doctor or health care professional as soon as possible:  allergic reactions like skin rash, itching or hives, swelling of the face, lips, or tongue  bone pain  breathing problems  dizziness  jaw pain, especially after dental work  redness, blistering, peeling of the skin  signs and symptoms of infection like fever or chills; cough; sore throat; pain or trouble passing urine  signs of low calcium like fast heartbeat, muscle cramps or muscle pain; pain, tingling, numbness in the hands or feet; seizures  unusual bleeding or bruising  unusually weak or tired Side effects that usually do not require medical attention (report to your doctor or health care professional if they continue or are bothersome):  constipation  diarrhea  headache  joint pain  loss of appetite  muscle pain  runny nose  tiredness  upset stomach This list may not describe all possible side effects. Call your doctor for medical advice about side effects. You may report side effects to FDA at 1-800-FDA-1088. Where should I keep my medicine? This medicine is only given in a clinic, doctor's office, or other health care setting and will not be stored at home. NOTE: This sheet is a summary. It may not cover all possible information. If you have questions about this medicine, talk to your doctor, pharmacist, or health care provider.  2021 [employer]/Gold Standard (2017-06-17 16:10:44)

## 2020-05-01 NOTE — Addendum Note (Signed)
Addended by: [name] on: 05/01/2020 11:27 AM   Modules accepted: Orders

## 2020-05-13 DIAGNOSIS — C50911 Malignant neoplasm of unspecified site of right female breast: Secondary | ICD-10-CM | POA: Diagnosis not present

## 2020-05-19 ENCOUNTER — Other Ambulatory Visit: Payer: Self-pay

## 2020-05-19 ENCOUNTER — Other Ambulatory Visit: Payer: Self-pay | Admitting: *Deleted

## 2020-05-19 NOTE — Patient Instructions (Signed)
Visit Information  Ms. Zipper was given information about Medicaid Managed Care team care coordination services as a part of their Healthy [employer] Medicaid benefit. [name] verbally consented to engagement with the [facility] Managed Care team.   For questions related to your Healthy [facility] health plan, please call: [phone] or visit the homepage here: [email or web address]  If you would like to schedule transportation through your Healthy [facility] plan, please call the following number at least 2 days in advance of your appointment: [phone]   Call the [place] at [phone], at any time, 24 hours a day, 7 days a week. If you are in danger or need immediate medical attention call 911.  Ms. Dannenberg - following are the goals we discussed in your visit today:  Goals Addressed            This Visit's Progress   . Monitor and Manage My Blood Sugar       Follow up 07/23/20     -check blood sugars as PCP recommends  -participate in light exercise daily  -continue to eat a plant based, low carb diet, increase protein rich foods  -attend all scheduled appointments    . Set My Target A1C       Follow up 07/23/20  - set target A1C -Patient and PCP would like for A1C to be 7.0 or less          Please see education materials related to iron rich diet and protein rich foods provided as print materials.     Protein Content in Foods Protein is a necessary nutrient in any diet. It helps build and repair muscles, bones, and skin. Depending on your overall health, you may need more or less protein in your diet. You are encouraged to eat a variety of protein foods to ensure that you get all the essential nutrients that are found in different protein foods. Talk to your health care provider or dietitian about how much protein you need each day and which sources of protein are best for you. Protein is especially  important for:  Repairing and making cells and tissues.  Fighting infection.  Energy.  Growth and development. See the following list for the protein content of some common foods. What are tips for getting more protein in your diet?  Try to replace processed carbohydrates with high-quality protein.  Snack on nuts and seeds instead of chips.  Replace baked desserts with [place] yogurt.  Eat protein foods from both plant and animal sources.  Replace red meat with seafood choices. What foods are high in proteins? High-protein foods contain 4 grams (g) or more of protein per serving. They include: Meat protein  Beef, ground sirloin (cooked) -- 3 oz (85 g) have 24 g of protein.  Chicken breast, boneless and skinless (cooked) -- 3 oz (85 g) have 25 g of protein.  Egg -- 1 egg has 6 g of protein.  Fish, filet (cooked) -- 1 oz (28 g) has 6-7 g of protein.  Lamb (cooked) -- 3 oz (85 g) has 24 g of protein.  Pork tenderloin (cooked) -- 3 oz (85 g) has 23 g of protein.  Tuna (canned in water) -- 3 oz (85 g) has 20 g of protein. Dairy  Cottage cheese -- 1/2 cup (114 g) has 13.4 g of protein.  Milk -- 1 cup (250 mL) has 8 g of protein.  Cheese (hard) -- 1 oz (28 g) has 7 g of  protein.  Yogurt, regular -- 6 oz (170 g) has 8 g of protein.  Greek yogurt -- 6 oz (200 g) has 18 g protein. Plant protein  Garbanzo beans (canned or cooked) -- 1/2 cup (130 g) has 6-7 g of protein.  Kidney beans (canned or cooked) -- 1/2 cup (130 g) has 6-7 g of protein.  Nuts (peanuts, pistachios, almonds) -- 1 oz (28 g) has 6 g of protein.  Peanut butter -- 1 oz (32 g) has 7-8 g of protein.  Pumpkin seeds -- 1 oz (28 g) has 8.5 g of protein.  Soybeans (roasted) -- 1 oz (28 g) has 8 g of protein.  Soybeans (cooked) -- 1/2 cup (90 g) has 11 g of protein.  Soy milk -- 1 cup (250 mL) has 5-10 g of protein.  Soy or vegetable patty -- 1 patty has 11 g of protein.  Sunflower seeds -- 1 oz (28  g) has 5.5 g of protein.  Tofu (firm) -- 1/2 cup (124 g) has 20 g of protein.  Tempeh -- 1/2 cup (83 g) has 16 g of protein. The items listed above may not be a complete list of foods high in protein. Actual amounts of protein may differ depending on processing. Contact a dietitian for more information.   What foods are low in protein? Low-protein foods contain 3 grams (g) or less of protein per serving. They include: Fruits  Fruit or vegetable juice -- 1/2 cup (125 mL) has 1 g of protein. Vegetables  Beets (raw or cooked) -- 1/2 cup (68 g) has 1.5 g of protein.  Broccoli (raw or cooked) -- 1/2 cup (44 g) has 2 g of protein.  Collard greens (raw or cooked) -- 1/2 cup (42 g) has 2 g of protein.  Green beans (raw or cooked) -- 1/2 cup (83 g) has 1 g of protein.  Green peas (canned) -- 1/2 cup (80 g) has 3.5 g of protein.  Potato (baked with skin) -- 1 medium potato (173 g) has 3 g of protein.  Spinach (cooked) -- 1/2 cup (90 g) has 3 g of protein.  Squash (cooked) -- 1/2 cup (90 g) has 1.5 g of protein.  Avocado -- 1 cup (146 g) has 2.7 g of protein. Grains  Bran cereal -- 1/2 cup (30 g) has 2-3 g of protein.  Bread -- 1 slice has 2.5 g of protein.  Corn (fresh or cooked) -- 1/2 cup (77 g) has 2 g of protein.  Flour tortilla -- One 6-inch (15 cm) tortilla has 2.5 g of protein.  Muffins -- 1 small muffin (2 oz or 57 g) has 3 g of protein.  Oatmeal (cooked) -- 1/2 cup (40 g) has 3 g of protein.  Rice (cooked) -- 1/2 cup (79 g) has 2.5-3.5 g of protein. Dairy  Cream cheese -- 1 oz (29 g) has 2 g of protein.  Creamer (half-and-half) -- 1 oz (29 mL) has 1 g of protein.  Frozen yogurt -- 1/2 cup (72 g) has 3 g of protein.  Sour cream -- 1/2 cup (75 g) has 2.5 g of protein. The items listed above may not be a complete list of foods low in protein. Actual amounts of protein may differ depending on processing. Contact a dietitian for more information.   Summary  Protein  is a nutrient that your body needs for growth and development, repairing and making cells and tissues, fighting infection, and providing energy.  Protein is  in both plant and animal foods. Some of these foods have more protein than others.  Depending on your overall health, you may need more or less protein in your diet. Talk to your health care provider about how much protein you need. This information is not intended to replace advice given to you by your health care provider. Make sure you discuss any questions you have with your health care provider. Document Revised: 02/07/2019 Document Reviewed: 02/07/2019 Elsevier Patient Education  2021 [place].    Iron-Rich Diet  Iron is a mineral that helps your body to produce hemoglobin. Hemoglobin is a protein in red blood cells that carries oxygen to your body's tissues. Eating too little iron may cause you to feel weak and tired, and it can increase your risk of infection. Iron is naturally found in many foods, and many foods have iron added to them (iron-fortified foods). You may need to follow an iron-rich diet if you do not have enough iron in your body due to certain medical conditions. The amount of iron that you need each day depends on your age, your sex, and any medical conditions you have. Follow instructions from your health care provider or a diet and nutrition specialist (dietitian) about how much iron you should eat each day. What are tips for following this plan? Reading food labels  Check food labels to see how many milligrams (mg) of iron are in each serving. Cooking  Cook foods in pots and pans that are made from iron.  Take these steps to make it easier for your body to absorb iron from certain foods: ? Soak beans overnight before cooking. ? Soak whole grains overnight and drain them before using. ? Ferment flours before baking, such as by using yeast in bread dough. Meal planning  When you eat foods that contain  iron, you should eat them with foods that are high in vitamin C. These include oranges, peppers, tomatoes, potatoes, and mango. Vitamin C helps your body to absorb iron. General information  Take iron supplements only as told by your health care provider. An overdose of iron can be life-threatening. If you were prescribed iron supplements, take them with orange juice or a vitamin C supplement.  When you eat iron-fortified foods or take an iron supplement, you should also eat foods that naturally contain iron, such as meat, poultry, and fish. Eating naturally iron-rich foods helps your body to absorb the iron that is added to other foods or contained in a supplement.  Certain foods and drinks prevent your body from absorbing iron properly. Avoid eating these foods in the same meal as iron-rich foods or with iron supplements. These foods include: ? Coffee, black tea, and red wine. ? Milk, dairy products, and foods that are high in calcium. ? Beans and soybeans. ? Whole grains. What foods should I eat? Fruits Prunes. Raisins. Eat fruits high in vitamin C, such as oranges, grapefruits, and strawberries, alongside iron-rich foods. Vegetables Spinach (cooked). Green peas. Broccoli. Fermented vegetables. Eat vegetables high in vitamin C, such as leafy greens, potatoes, bell peppers, and tomatoes, alongside iron-rich foods. Grains Iron-fortified breakfast cereal. Iron-fortified whole-wheat bread. Enriched rice. Sprouted grains. Meats and other proteins Beef liver. Oysters. Beef. Shrimp. [place]. Chicken. [place]. Sardines. Chickpeas. Nuts. Tofu. Pumpkin seeds. Beverages Tomato juice. Fresh orange juice. Prune juice. Hibiscus tea. Fortified instant breakfast shakes. Sweets and desserts Blackstrap molasses. Seasonings and condiments Tahini. Fermented soy sauce. Other foods Wheat germ. The items listed above may not be  a complete list of recommended foods and beverages. Contact a dietitian for more  information. What foods should I avoid? Grains Whole grains. Bran cereal. Bran flour. Oats. Meats and other proteins Soybeans. Products made from soy protein. Black beans. Lentils. Mung beans. Split peas. Dairy Milk. Cream. Cheese. Yogurt. Cottage cheese. Beverages Coffee. Black tea. Red wine. Sweets and desserts Cocoa. Chocolate. Ice cream. Other foods Basil. Oregano. Large amounts of parsley. The items listed above may not be a complete list of foods and beverages to avoid. Contact a dietitian for more information. Summary  Iron is a mineral that helps your body to produce hemoglobin. Hemoglobin is a protein in red blood cells that carries oxygen to your body's tissues.  Iron is naturally found in many foods, and many foods have iron added to them (iron-fortified foods).  When you eat foods that contain iron, you should eat them with foods that are high in vitamin C. Vitamin C helps your body to absorb iron.  Certain foods and drinks prevent your body from absorbing iron properly, such as whole grains and dairy products. You should avoid eating these foods in the same meal as iron-rich foods or with iron supplements. This information is not intended to replace advice given to you by your health care provider. Make sure you discuss any questions you have with your health care provider. Document Revised: 01/21/2017 Document Reviewed: 01/04/2017 Elsevier Patient Education  2021 [employer].   The patient verbalized understanding of instructions provided today and agreed to receive a mailed copy of patient instruction and/or educational materials.  Telephone follow up appointment with Managed Medicaid care management team member scheduled for:07/23/20 @ 10:30am  Lurena Joiner RN, BSN [place] RN Care Coordinator   Following is a copy of your plan of care:  Patient Care Plan: Diabetes Management    Problem Identified: Glycemic Management (Diabetes,  Type 2)     Long-Range Goal: Glycemic Management Optimized   Start Date: 01/16/2020  Expected End Date: 07/23/2020  This Visit's Progress: On track  Recent Progress: On track  Priority: High  Note:    Current Barriers:  . Chronic Disease Management support and education needs related to Diabetes . Ms Harral manages her diabetes by eating a healthy, plant based diet, checking blood sugars 3-4 times daily, attending routine screening appointments, and walking on days when the weather is nice.-Update- Ms. Bolding was + Covid in February, feeling better, continues to have mild cough and runny nose. She is experiencing muscle weakness due to [place]. Plans to purchase a device to help open jars when she gets paid. Recent A1C 7.7, admits to liking Reece cups, otherwise adheres to a healthy diet. Denies any needs at this time.  Nurse Case Manager Clinical Goal(s):  Marland Kitchen Over the next 60 days, patient will meet with RN Care Manager to address barriers to managing medical care-Met-Ms Klett reports not barriers at this time . Over the next 60 days, the patient will demonstrate ongoing self health care management ability as evidenced by improving A1C-Next PCP appointment 06/30/20  Interventions:  . Inter-disciplinary care team collaboration (see longitudinal plan of care) . Evaluation of current treatment plan related to Diabetes and patient's adherence to plan as established by provider. . Discussed plans with patient for ongoing care management follow up and provided patient with direct contact information for care management team . Reviewed scheduled/upcoming provider appointments  . Advised patient, providing education and rationale, to check cbg as directed by  Dr. Juleen China and record, calling Dr. Juleen China for findings outside established parameters.   . Provided information on iron rich foods . Collaborate with [facility] pharmacist for medication management  Patient Goals/Self-Care Activities Over the next  60 days, patient will: - Self administers medications as prescribed - Attends all scheduled provider appointments - Calls pharmacy for medication refills - Calls provider office for new concerns or questions - check blood sugar at prescribed times - check blood sugar if I feel it is too high or too low - enter blood sugar readings and medication or insulin into daily log - take the blood sugar log to all doctor visits - take the blood sugar meter to all doctor visits -continue your diet of plant based food choices, limiting carbohydrates and sugars -exercise daily as tolerated  Follow Up Plan: Telephone follow up appointment with Managed Medicaid care management team member scheduled for:07/23/20 @ 10:30am

## 2020-05-19 NOTE — Patient Outreach (Signed)
Medicaid Managed Care   Nurse Care Manager Note  05/19/2020 Name:  Emily Wilson MRN:  [id number] DOB:  08-Nov-1965  Emily Wilson is an 55 y.o. year old female who is Wilson primary patient of Emily Bang, DO.  The [facility] Managed Care Coordination team was consulted for assistance with:    DMII  Emily Wilson was given information about Medicaid Managed Care Coordination team services today. Emily Wilson agreed to services and verbal consent obtained.  Engaged with patient by telephone for follow up visit in response to provider referral for case management and/or care coordination services.   Assessments/Interventions:  Review of past medical history, allergies, medications, health status, including review of consultants reports, laboratory and other test data, was performed as part of comprehensive evaluation and provision of chronic care management services.  SDOH (Social Determinants of Health) assessments and interventions performed:   Care Plan  Allergies  Allergen Reactions  . Doxycycline Shortness Of Breath    Wheezing, shortness of breath, rash head to toe, and swelling  . Naproxen Shortness Of Breath  . Codeine Hives  . Hydrocodone-Acetaminophen Hives  . Ibuprofen     Irritant to stomach r/t diverticulitis  . Lantus [Insulin Glargine]     Yeast Infections  . Meloxicam Other (See Comments)    Abdominal pain   . Propoxyphene N-Acetaminophen Hives  . Sulfonamide Derivatives Hives  . Tramadol Other (See Comments)    Abdominal Pain    Medications Reviewed Today    Reviewed by Emily Montane, RN (Registered Nurse) on 05/19/20 at 44  Med List Status: <None>  Medication Order Taking? Sig Documenting Provider Last Dose Status Informant  Accu-Chek FastClix Lancets MISC [id number] Yes USE TO TEST BLOOD SUGAR UP TO FOUR TIMES DAILY AS DIRECTED Emily Bang, DO Taking Active   ACCU-CHEK GUIDE test strip [id number] Yes USE TO CHECK BLOOD SUGAR  UP TO 4 TIMES Wilson DAY. DX: E11.65 Emily Bang, DO Taking Active   Alpha-Lipoic Acid 200 MG CAPS [id number] No Take 200 mg by mouth daily.  Patient not taking: Reported on 05/19/2020   [provider] Not Taking Active   BD INSULIN SYRINGE U/F 31G X 5/16" 0.5 ML MISC [id number] Yes USE TO ADMINISTER INSULIN 3 TIMES DAILY WITH MEALS Emily Bang, DO Taking Active   diclofenac (VOLTAREN) 75 MG EC tablet [id number] Yes Take 75 mg by mouth 2 (two) times daily as needed. [provider] Taking Active   ferrous sulfate 325 (65 FE) MG tablet [id number] No Take 325 mg by mouth daily with breakfast.  Patient not taking: Reported on 05/19/2020   [provider] Not Taking Active   gabapentin (NEURONTIN) 100 MG capsule [id number] Yes Take 3 capsules (300 mg total) by mouth 3 (three) times daily. Emily Lose, MD Taking Active            Med Note Emily Wilson, Emily Wilson   Wed Jan 16, 2020 10:50 AM) Taking as needed  IBRANCE 125 MG tablet [id number] Yes TAKE 1 TABLET (125 MG TOTAL) BY MOUTH DAILY. TAKE FOR 21 DAYS ON, 7 DAYS OFF, REPEAT EVERY 28 DAYS. Emily Lose, MD Taking Active   insulin NPH Human (HUMULIN N) 100 UNIT/ML injection [id number] Yes Inject 0.12 mLs (12 Units total) into the skin at bedtime. Emily Bang, DO Taking Active   insulin regular (HUMULIN R) 100 units/mL injection [id number] Yes Inject 0.12 mLs (12 Units total) into the skin 3 (three) times daily before meals.  Emily Wilson. Hold dose for blood sugar less than 125 Emily Bang, DO Taking Active   letrozole [facility]) 2.5 MG tablet [id number] Yes TAKE 1 TABLET BY MOUTH EVERY DAY Emily Lose, MD Taking Active   Loratadine (CLARITIN PO) [id number] No Take by mouth.  Patient not taking: Reported on 05/19/2020   [provider] Not Taking Active   metoprolol succinate (TOPROL-XL) 25 MG 24 hr tablet [id number] Yes TAKE 1 TABLET BY MOUTH EVERY DAY Emily Lance, MD Taking Active    Milk Thistle 1000 MG CAPS [id number] Yes Take 1,000 mg by mouth daily. [provider] Taking Active   mirabegron ER (MYRBETRIQ) 50 MG TB24 tablet [id number] No Take 1 tablet (50 mg total) by mouth daily.  Patient not taking: Reported on 05/19/2020   Emily Loser, MD Not Taking Active   Multiple Vitamin (MULTIVITAMIN WITH MINERALS) TABS tablet [id number] Yes Take 1 tablet by mouth daily. [provider] Taking Active Self  SUPER B COMPLEX/C PO [id number] No Take 1 tablet by mouth daily.  Patient not taking: Reported on 05/19/2020   [provider] Not Taking Active   Vitamin D, Ergocalciferol, (DRISDOL) 1.25 MG (50000 UNIT) CAPS capsule [id number] Yes Take 1 capsule by mouth once Wilson week. Emily Chol, MD Taking Active   Med List Note Emily Wilson, [facility] 10/02/18 1151): Ibrance filled out [place]          Patient Active Problem List   Diagnosis Date Noted  . History of shingles 01/09/2019  . S/P mastectomy, right 11/16/2018  . Bone metastasis ([place]) 05/31/2018  . Family history of breast cancer in female 03/13/2015  . Malignant neoplasm of lower-inner quadrant of right breast of female, estrogen receptor positive ([place]) 02/25/2015  . DM (diabetes mellitus), type 2 ([place]) 02/25/2015  . HLD (hyperlipidemia) 02/25/2015  . Palpitations 01/26/2010  . Anxiety state 05/26/2009  . Hyperthyroidism 11/14/2006  . Hypertension 11/14/2006    Conditions to be addressed/monitored per PCP order:  DMII  Care Plan : Diabetes Management  Updates made by Emily Montane, RN since 05/19/2020 12:00 AM    Problem: Glycemic Management (Diabetes, Type 2)     Long-Range Goal: Glycemic Management Optimized   Start Date: 01/16/2020  Expected End Date: 07/23/2020  This Visit's Progress: On track  Recent Progress: On track  Priority: High  Note:    Current Barriers:  . Chronic Disease Management support and education needs related to Diabetes .  Emily Wilson manages her diabetes by eating Wilson healthy, plant based diet, checking blood sugars 3-4 times daily, attending routine screening appointments, and walking on days when the weather is nice.-Update- Emily Wilson was + Covid in February, feeling better, continues to have mild cough and runny nose. She is experiencing muscle weakness due to [place]. Plans to purchase [place] device to help open jars when she gets paid. Recent A1C 7.7, admits to liking Reece cups, otherwise adheres to Wilson healthy diet. Denies any needs at this time.  Nurse Case Manager Clinical Goal(s):  Marland Kitchen Over the next 60 days, patient will meet with RN Care Manager to address barriers to managing medical care-Met-Emily Minetti reports not barriers at this time . Over the next 60 days, the patient will demonstrate ongoing self health care management ability as evidenced by improving A1C-Next PCP appointment 06/30/20  Interventions:  . Inter-disciplinary care team collaboration (see longitudinal plan of care) . Evaluation of current treatment plan related to Diabetes and patient's adherence to plan  as established by provider. . Discussed plans with patient for ongoing care management follow up and provided patient with direct contact information for care management team . Reviewed scheduled/upcoming provider appointments  . Advised patient, providing education and rationale, to check cbg as directed by Dr. Juleen China and record, calling Dr. Juleen China for findings outside established parameters.   . Provided information on [place] rich foods . Collaborate with [facility] pharmacist for medication management  Patient Goals/Self-Care Activities Over the next 60 days, patient will: - Self administers medications as prescribed - Attends all scheduled provider appointments - Calls pharmacy for medication refills - Calls provider office for new concerns or questions - check blood sugar at prescribed times - check blood sugar if I feel it is too high or too  low - enter blood sugar readings and medication or insulin into daily log - take the blood sugar log to all doctor visits - take the blood sugar meter to all doctor visits -continue your diet of plant based food choices, limiting carbohydrates and sugars -exercise daily as tolerated  Follow Up Plan: Telephone follow up appointment with Managed Medicaid care management team member scheduled for:07/23/20 @ 10:30am       Follow Up:  Patient agrees to Care Plan and Follow-up.  Plan: The Managed Medicaid care management team will reach out to the patient again over the next 60 days.  Date/time of next scheduled RN care management/care coordination outreach: 07/23/20 @ 10:30am  Lurena Joiner RN, [place] RN Care Coordinator

## 2020-05-20 ENCOUNTER — Other Ambulatory Visit (HOSPITAL_COMMUNITY): Payer: Self-pay

## 2020-05-20 MED FILL — IBRANCE 125 MG TABS: 125 | 28 days supply | Qty: 21 | Fill #3

## 2020-05-23 ENCOUNTER — Other Ambulatory Visit: Payer: Self-pay | Admitting: Internal Medicine

## 2020-05-23 DIAGNOSIS — Z794 Long term (current) use of insulin: Secondary | ICD-10-CM

## 2020-05-23 DIAGNOSIS — E1159 Type 2 diabetes mellitus with other circulatory complications: Secondary | ICD-10-CM

## 2020-05-26 ENCOUNTER — Other Ambulatory Visit: Payer: Self-pay

## 2020-05-26 NOTE — Patient Instructions (Signed)
Visit Information  Ms. Crago was given information about Medicaid Managed Care team care coordination services as a part of their Healthy [employer] Medicaid benefit. [name] verbally consented to engagement with the [facility] Managed Care team.   For questions related to your Healthy [facility] health plan, please call: [phone] or visit the homepage here: [email or web address]  If you would like to schedule transportation through your Healthy [facility] plan, please call the following number at least 2 days in advance of your appointment: [phone]   Call the [place] at [phone], at any time, 24 hours a day, 7 days a week. If you are in danger or need immediate medical attention call 911.  Ms. Froemming - following are the goals we discussed in your visit today:  Goals Addressed   None     Please see education materials related to DM provided as print materials.   Patient verbalizes understanding of instructions provided today.   The Managed Medicaid care management team will reach out to the patient again over the next 90 days.   [place] Constable, Pharm.D., Managed Medicaid Pharmacist [phone]   Following is a copy of your plan of care:  Patient Care Plan: Diabetes Management    Problem Identified: Glycemic Management (Diabetes, Type 2)     Long-Range Goal: Glycemic Management Optimized   Start Date: 01/16/2020  Expected End Date: 07/23/2020  This Visit's Progress: On track  Recent Progress: On track  Priority: High  Note:    Current Barriers:  . Chronic Disease Management support and education needs related to Diabetes . Ms Hillmer manages her diabetes by eating a healthy, plant based diet, checking blood sugars 3-4 times daily, attending routine screening appointments, and walking on days when the weather is nice.-Update- Ms. Mandell was + Covid in February, feeling better, continues to have  mild cough and runny nose. She is experiencing muscle weakness due to [place]. Plans to purchase a device to help open jars when she gets paid. Recent A1C 7.7, admits to liking Reece cups, otherwise adheres to a healthy diet. Denies any needs at this time.  Nurse Case Manager Clinical Goal(s):  Marland Kitchen Over the next 60 days, patient will meet with RN Care Manager to address barriers to managing medical care-Met-Ms Faux reports not barriers at this time . Over the next 60 days, the patient will demonstrate ongoing self health care management ability as evidenced by improving A1C-Next PCP appointment 06/30/20  Interventions:  . Inter-disciplinary care team collaboration (see longitudinal plan of care) . Evaluation of current treatment plan related to Diabetes and patient's adherence to plan as established by provider. . Discussed plans with patient for ongoing care management follow up and provided patient with direct contact information for care management team . Reviewed scheduled/upcoming provider appointments  . Advised patient, providing education and rationale, to check cbg as directed by Dr. Juleen China and record, calling Dr. Juleen China for findings outside established parameters.   . Provided information on iron rich foods . Collaborate with MM pharmacist for medication management  Patient Goals/Self-Care Activities Over the next 60 days, patient will: - Self administers medications as prescribed - Attends all scheduled provider appointments - Calls pharmacy for medication refills - Calls provider office for new concerns or questions - check blood sugar at prescribed times - check blood sugar if I feel it is too high or too low - enter blood sugar readings and medication or insulin into daily log - take the blood sugar  log to all doctor visits - take the blood sugar meter to all doctor visits -continue your diet of plant based food choices, limiting carbohydrates and sugars -exercise daily as  tolerated  Follow Up Plan: Telephone follow up appointment with Managed Medicaid care management team member scheduled for:07/23/20 @ 10:30am     Patient Care Plan: Medication Management    Problem Identified: Health Promotion or Disease Self-Management (General Plan of Care)     Goal: Medication Management   Note:   Current Barriers:  . Does not contact provider office for questions/concerns .   Pharmacist Clinical Goal(s):  Marland Kitchen Over the next 90 days, patient will contact provider office for questions/concerns as evidenced notation of same in electronic health record through collaboration with PharmD and provider.  .   Interventions: . Inter-disciplinary care team collaboration (see longitudinal plan of care) . Comprehensive medication review performed; medication list updated in electronic medical record  _0 @ _1 @ _2 @  Patient Goals/Self-Care Activities . Over the next 90 days, patient will:  - collaborate with provider on medication access solutions  Follow Up Plan: The care management team will reach out to the patient again over the next 90 days.     Task: Mutually Develop and Royce Macadamia Achievement of Patient Goals   Note:   Care Management Activities:    - verbalization of feelings encouraged    Notes:

## 2020-05-26 NOTE — Patient Outreach (Signed)
Medicaid Managed Care    Pharmacy Note  05/26/2020 Name: Emily Wilson MRN: [id number] DOB: [birth year]  Emily Wilson is Wilson 55 y.o. year old female who is Wilson primary care patient of Emily Bang, DO. The [facility] Managed Care Coordination team was consulted for assistance with disease management and care coordination needs.    Engaged with patient Engaged with patient by telephone for initial visit in response to referral for case management and/or care coordination services.  [name] was given information about Managed Medicaid Care Coordination team services today. Emily Wilson agreed to services and verbal consent obtained.   Objective:  Lab Results  Component Value Date   CREATININE 0.71 04/24/2020   CREATININE 0.83 02/01/2020   CREATININE 0.75 11/02/2019    Lab Results  Component Value Date   HGBA1C 7.7 (H) 03/31/2020       Component Value Date/Time   CHOL 235 (H) 03/31/2020 1030   TRIG 154 (H) 03/31/2020 1030   HDL 34 (L) 03/31/2020 1030   CHOLHDL 6.9 (H) 03/31/2020 1030   CHOLHDL 6 11/04/2015 1045   VLDL 52.6 (H) 11/04/2015 1045   LDLCALC 172 (H) 03/31/2020 1030   LDLDIRECT 210.0 11/04/2015 1045    Other: (TSH, CBC, Vit D, etc.)  Clinical ASCVD: No  The 10-year ASCVD risk score Emily Wilson DC Jr., et al., 2013) is: 8.1%   Values used to calculate the score:     Age: 27 years     Sex: Female     Is Non-Hispanic African American: No     Diabetic: Yes     Tobacco smoker: No     Systolic Blood Pressure: 606 mmHg     Is BP treated: Yes     HDL Cholesterol: 34 mg/dL     Total Cholesterol: 235 mg/dL    Other: (CHADS2VASc if Afib, PHQ9 if depression, MMRC or CAT for COPD, ACT, DEXA)  BP Readings from Last 3 Encounters:  05/01/20 122/73  04/11/20 110/64  04/03/20 136/88    Assessment/Interventions: Review of patient past medical history, allergies, medications, health status, including review of consultants reports, laboratory and other  test data, was performed as part of comprehensive evaluation and provision of chronic care management services.   Pain Pain Scale: Patient didn't give pain scale because she said she only takes meds PRN and when she takes them she's content with the response Diclofenac 21m BID (Mainly takes in winter) Gabapentin 1041mtake 3TID (PRN) for trigger finger Plan: At goal,  patient stable/ symptoms controlled   HTN -Born with heart condition per patient Metoprolol Succ 2566mD Plan: At goal,  patient stable/ symptoms controlled  Cholesterol Tried/Failed: Atorvastatin (Due to interaction with Palbociclib) Plan: Recommend Rosuvastatin due to renal clearance  DM -Self reporting: 130's and 150's NPH 12 units HS Insulin R 12 Units TID Plan: At goal,  patient stable/ symptoms controlled  BC Letrozole Palbociclib Plan: At goal,  patient stable/ symptoms controlled   SDOH (Social Determinants of Health) assessments and interventions performed:    Care Plan  Allergies  Allergen Reactions  . Doxycycline Shortness Of Breath    Wheezing, shortness of breath, rash head to toe, and swelling  . Naproxen Shortness Of Breath  . Codeine Hives  . Hydrocodone-Acetaminophen Hives  . Ibuprofen     Irritant to stomach r/t diverticulitis  . Lantus [Insulin Glargine]     Yeast Infections  . Meloxicam Other (See Comments)    Abdominal pain   . Propoxyphene  N-Acetaminophen Hives  . Sulfonamide Derivatives Hives  . Tramadol Other (See Comments)    Abdominal Pain    Medications Reviewed Today    Reviewed by Emily Montane, RN (Registered Nurse) on 05/19/20 at 32  Med List Status: <None>  Medication Order Taking? Sig Documenting Provider Last Dose Status Informant  Accu-Chek FastClix Lancets MISC [id number] Yes USE TO TEST BLOOD SUGAR UP TO FOUR TIMES DAILY AS DIRECTED Emily Bang, DO Taking Active   ACCU-CHEK GUIDE test strip [id number] Yes USE TO CHECK BLOOD SUGAR UP TO 4  TIMES Wilson DAY. DX: E11.65 Emily Bang, DO Taking Active   Alpha-Lipoic Acid 200 MG CAPS [id number] No Take 200 mg by mouth daily.  Patient not taking: Reported on 05/19/2020   [provider] Not Taking Active   BD INSULIN SYRINGE U/F 31G X 5/16" 0.5 ML MISC [id number] Yes USE TO ADMINISTER INSULIN 3 TIMES DAILY WITH MEALS Emily Bang, DO Taking Active   diclofenac (VOLTAREN) 75 MG EC tablet [id number] Yes Take 75 mg by mouth 2 (two) times daily as needed. [provider] Taking Active   ferrous sulfate 325 (65 FE) MG tablet [id number] No Take 325 mg by mouth daily with breakfast.  Patient not taking: Reported on 05/19/2020   [provider] Not Taking Active   gabapentin (NEURONTIN) 100 MG capsule [id number] Yes Take 3 capsules (300 mg total) by mouth 3 (three) times daily. Emily Lose, MD Taking Active            Med Note Emily Wilson, Emily Wilson   Wed Jan 16, 2020 10:50 AM) Taking as needed  IBRANCE 125 MG tablet [id number] Yes TAKE 1 TABLET (125 MG TOTAL) BY MOUTH DAILY. TAKE FOR 21 DAYS ON, 7 DAYS OFF, REPEAT EVERY 28 DAYS. Emily Lose, MD Taking Active   insulin NPH Human (HUMULIN N) 100 UNIT/ML injection [id number] Yes Inject 0.12 mLs (12 Units total) into the skin at bedtime. Emily Bang, DO Taking Active   insulin regular (HUMULIN R) 100 units/mL injection [id number] Yes Inject 0.12 mLs (12 Units total) into the skin 3 (three) times daily before meals. E.11.9. Hold dose for blood sugar less than 125 Emily Bang, DO Taking Active   letrozole Emily Wilson) 2.5 MG tablet [id number] Yes TAKE 1 TABLET BY MOUTH EVERY DAY Emily Lose, MD Taking Active   Loratadine (CLARITIN PO) [id number] No Take by mouth.  Patient not taking: Reported on 05/19/2020   [provider] Not Taking Active   metoprolol succinate (TOPROL-XL) 25 MG 24 hr tablet [id number] Yes TAKE 1 TABLET BY MOUTH EVERY DAY Emily Lance, MD Taking Active   Milk  Thistle 1000 MG CAPS [id number] Yes Take 1,000 mg by mouth daily. [provider] Taking Active   mirabegron ER (MYRBETRIQ) 50 MG TB24 tablet [id number] No Take 1 tablet (50 mg total) by mouth daily.  Patient not taking: Reported on 05/19/2020   [name], MD Not Taking Active   Multiple Vitamin (MULTIVITAMIN WITH MINERALS) TABS tablet [id number] Yes Take 1 tablet by mouth daily. [provider] Taking Active Self  SUPER B COMPLEX/C PO [id number] No Take 1 tablet by mouth daily.  Patient not taking: Reported on 05/19/2020   [provider] Not Taking Active   Vitamin D, Ergocalciferol, (DRISDOL) 1.25 MG (50000 UNIT) CAPS capsule [id number] Yes Take 1 capsule by mouth once Wilson week. [name], MD Taking Active   Med List Note [name], [facility] 10/02/18 1151):  Ibrance filled out [place]          Patient Active Problem List   Diagnosis Date Noted  . History of shingles 01/09/2019  . S/P mastectomy, right 11/16/2018  . Bone metastasis ([place]) 05/31/2018  . Family history of breast cancer in female 03/13/2015  . Malignant neoplasm of lower-inner quadrant of right breast of female, estrogen receptor positive ([place]) 02/25/2015  . DM (diabetes mellitus), type 2 ([place]) 02/25/2015  . HLD (hyperlipidemia) 02/25/2015  . Palpitations 01/26/2010  . Anxiety state 05/26/2009  . Hyperthyroidism 11/14/2006  . Hypertension 11/14/2006    Conditions to be addressed/monitored: HTN and DM  Patient Care Plan: Diabetes Management    Problem Identified: Glycemic Management (Diabetes, Type 2)     Long-Range Goal: Glycemic Management Optimized   Start Date: 01/16/2020  Expected End Date: 07/23/2020  This Visit's Progress: On track  Recent Progress: On track  Priority: High  Note:    Current Barriers:  . Chronic Disease Management support and education needs related to Diabetes . Ms Brinton manages her diabetes by eating Wilson healthy, plant  based diet, checking blood sugars 3-4 times daily, attending routine screening appointments, and walking on days when the weather is nice.-Update- Ms. Kolarik was + Covid in February, feeling better, continues to have mild cough and runny nose. She is experiencing muscle weakness due to [place]. Plans to purchase Wilson device to help open jars when she gets paid. Recent A1C 7.7, admits to liking Reece cups, otherwise adheres to Wilson healthy diet. Denies any needs at this time.  Nurse Case Manager Clinical Goal(s):  Marland Kitchen Over the next 60 days, patient will meet with RN Care Manager to address barriers to managing medical care-Met-Ms Akkerman reports not barriers at this time . Over the next 60 days, the patient will demonstrate ongoing self health care management ability as evidenced by improving A1C-Next PCP appointment 06/30/20  Interventions:  . Inter-disciplinary care team collaboration (see longitudinal plan of care) . Evaluation of current treatment plan related to Diabetes and patient's adherence to plan as established by provider. . Discussed plans with patient for ongoing care management follow up and provided patient with direct contact information for care management team . Reviewed scheduled/upcoming provider appointments  . Advised patient, providing education and rationale, to check cbg as directed by Dr. Juleen China and record, calling Dr. Juleen China for findings outside established parameters.   . Provided information on Wilson rich foods . Collaborate with MM pharmacist for medication management  Patient Goals/Self-Care Activities Over the next 60 days, patient will: - Self administers medications as prescribed - Attends all scheduled provider appointments - Calls pharmacy for medication refills - Calls provider office for new concerns or questions - check blood sugar at prescribed times - check blood sugar if I feel it is too high or too low - enter blood sugar readings and medication or insulin  into daily log - take the blood sugar log to all doctor visits - take the blood sugar meter to all doctor visits -continue your diet of plant based food choices, limiting carbohydrates and sugars -exercise daily as tolerated  Follow Up Plan: Telephone follow up appointment with Managed Medicaid care management team member scheduled for:07/23/20 @ 10:30am     Patient Care Plan: Medication Management    Problem Identified: Health Promotion or Disease Self-Management (General Plan of Care)     Goal: Medication Management   Note:   Current Barriers:  . Does not contact provider office  for questions/concerns .   Pharmacist Clinical Goal(s):  Marland Kitchen Over the next 90 days, patient will contact provider office for questions/concerns as evidenced notation of same in electronic health record through collaboration with PharmD and provider.  .   Interventions: . Inter-disciplinary care team collaboration (see longitudinal plan of care) . Comprehensive medication review performed; medication list updated in electronic medical record  _0 @ _1 @ _2 @  Patient Goals/Self-Care Activities . Over the next 90 days, patient will:  - collaborate with provider on medication access solutions  Follow Up Plan: The care management team will reach out to the patient again over the next 90 days.     Task: Mutually Develop and Royce Macadamia Achievement of Patient Goals   Note:   Care Management Activities:    - verbalization of feelings encouraged    Notes:      Medication Assistance: None required. Patient affirms current coverage meets needs.   Follow up: Agree   Plan: The care management team will reach out to the patient again over the next 90 days.   [place] Constable, Pharm.D., Managed Medicaid Pharmacist - [phone]

## 2020-06-02 ENCOUNTER — Other Ambulatory Visit (HOSPITAL_COMMUNITY): Payer: Self-pay

## 2020-06-02 ENCOUNTER — Other Ambulatory Visit: Payer: Self-pay | Admitting: Hematology and Oncology

## 2020-06-02 DIAGNOSIS — C50311 Malignant neoplasm of lower-inner quadrant of right female breast: Secondary | ICD-10-CM

## 2020-06-02 DIAGNOSIS — Z170 Estrogen receptor positive status [ER+]: Secondary | ICD-10-CM

## 2020-06-02 MED ORDER — PALBOCICLIB 125 MG PO TABS
ORAL_TABLET | ORAL | 3 refills | Status: DC
  Filled 2020-06-02: qty 21, 28d supply, fill #0
  Filled 2020-06-30: qty 21, 28d supply, fill #1
  Filled 2020-07-25: qty 21, 28d supply, fill #2
  Filled 2020-08-27: qty 21, 28d supply, fill #3

## 2020-06-11 ENCOUNTER — Other Ambulatory Visit (HOSPITAL_COMMUNITY): Payer: Self-pay

## 2020-06-22 ENCOUNTER — Other Ambulatory Visit: Payer: Self-pay | Admitting: Hematology and Oncology

## 2020-06-30 ENCOUNTER — Other Ambulatory Visit: Payer: Self-pay

## 2020-06-30 ENCOUNTER — Telehealth (INDEPENDENT_AMBULATORY_CARE_PROVIDER_SITE_OTHER): Payer: Medicaid Other | Admitting: Internal Medicine

## 2020-06-30 ENCOUNTER — Other Ambulatory Visit (HOSPITAL_COMMUNITY): Payer: Self-pay

## 2020-06-30 DIAGNOSIS — E785 Hyperlipidemia, unspecified: Secondary | ICD-10-CM | POA: Diagnosis not present

## 2020-06-30 DIAGNOSIS — E119 Type 2 diabetes mellitus without complications: Secondary | ICD-10-CM

## 2020-06-30 DIAGNOSIS — Z794 Long term (current) use of insulin: Secondary | ICD-10-CM

## 2020-06-30 MED ORDER — ROSUVASTATIN CALCIUM 40 MG PO TABS
40.0000 mg | ORAL_TABLET | Freq: Every day | ORAL | 3 refills | Status: DC
Start: 2020-06-30 — End: 2021-06-19

## 2020-06-30 MED ORDER — ACCU-CHEK FASTCLIX LANCETS MISC: 2 refills | Status: DC

## 2020-06-30 MED ORDER — ACCU-CHEK GUIDE VI STRP: ORAL_STRIP | 2 refills | Status: DC

## 2020-06-30 MED ORDER — "BD INSULIN SYRINGE U/F 31G X 5/16"" 0.5 ML MISC": 2 refills | Status: DC

## 2020-06-30 NOTE — Patient Outreach (Signed)
Medicaid Managed Care    Pharmacy Note  06/30/2020 Name: Emily Wilson MRN: [id number] DOB: 16-May-1965  Emily Wilson is a 55 y.o. year old female who is a primary care patient of Emily Wilson. [facility] Managed Care Coordination team was consulted for assistance with disease management and care coordination needs.    Engaged with patient Engaged with patient by telephone for initial visit in response to referral for case management and/or care coordination services.  [name] was given information about Managed Medicaid Care Coordination team services today. Emily Wilson agreed to services and verbal consent obtained.   Objective:  Lab Results  Component Value Date   CREATININE 0.71 04/24/2020   CREATININE 0.83 02/01/2020   CREATININE 0.75 11/02/2019    Lab Results  Component Value Date   HGBA1C 7.7 (H) 03/31/2020       Component Value Date/Time   CHOL 235 (H) 03/31/2020 1030   TRIG 154 (H) 03/31/2020 1030   HDL 34 (L) 03/31/2020 1030   CHOLHDL 6.9 (H) 03/31/2020 1030   CHOLHDL 6 11/04/2015 1045   VLDL 52.6 (H) 11/04/2015 1045   LDLCALC 172 (H) 03/31/2020 1030   LDLDIRECT 210.0 11/04/2015 1045    Other: (TSH, CBC, Vit D, etc.)  Clinical ASCVD: No  The 10-year ASCVD risk score Emily Wilson., et al., 2013) is: 8.1%   Values used to calculate the score:     Age: 89 years     Sex: Female     Is Non-Hispanic African American: No     Diabetic: Yes     Tobacco smoker: No     Systolic Blood Pressure: 956 mmHg     Is BP treated: Yes     HDL Cholesterol: 34 mg/dL     Total Cholesterol: 235 mg/dL    Other: (CHADS2VASc if Afib, PHQ9 if depression, MMRC or CAT for COPD, ACT, DEXA)  BP Readings from Last 3 Encounters:  05/01/20 122/73  04/11/20 110/64  04/03/20 136/88    Assessment/Interventions: Review of patient past medical history, allergies, medications, health status, including review of consultants reports, laboratory and other  test data, was performed as part of comprehensive evaluation and provision of chronic care management services.   Pain Pain Scale: Patient didn't give pain scale because she said she only takes meds PRN and when she takes them she's content with the response Diclofenac 44m BID (Mainly takes in winter) Gabapentin 1036mtake 3TID (PRN) for trigger finger Plan: At goal,  patient stable/ symptoms controlled   HTN -Born with heart condition per patient Metoprolol Succ 2535mD Plan: At goal,  patient stable/ symptoms controlled  Cholesterol Tried/Failed: Atorvastatin (Due to interaction with Palbociclib) April 2022 Plan: Recommend Rosuvastatin due to renal clearance May 2022: Patient called after appt with PCP, PCP stated she never got msg about statin. Double checked and PCP was CC'd on chart. Sent msg to PCP double checking  DM -Self reporting: 130's and 150's NPH 12 units HS Insulin R 12 Units TID Plan: At goal,  patient stable/ symptoms controlled  BC Letrozole Palbociclib Plan: At goal,  patient stable/ symptoms controlled   SDOH (Social Determinants of Health) assessments and interventions performed:    Care Plan  Allergies  Allergen Reactions  . Doxycycline Shortness Of Breath    Wheezing, shortness of breath, rash head to toe, and swelling  . Naproxen Shortness Of Breath  . Codeine Hives  . Hydrocodone-Acetaminophen Hives  . Ibuprofen  Irritant to stomach r/t diverticulitis  . Lantus [Insulin Glargine]     Yeast Infections  . Meloxicam Other (See Comments)    Abdominal pain   . Propoxyphene N-Acetaminophen Hives  . Sulfonamide Derivatives Hives  . Tramadol Other (See Comments)    Abdominal Pain    Medications Reviewed Today    Reviewed by Emily Wilson (Registered Nurse) on 06/30/20 at [phone]  Med List Status: <None>  Medication Order Taking? Sig Documenting Provider Last Dose Status Informant  Accu-Chek FastClix Lancets MISC [id number]  USE TO TEST  BLOOD SUGAR UP TO FOUR TIMES DAILY AS DIRECTED Emily Wilson  Active   ACCU-CHEK GUIDE test strip [id number]  USE TO CHECK BLOOD SUGAR UP TO 4 TIMES A DAY. DX: E11.65 Emily Wilson  Active   Alpha-Lipoic Acid 200 MG CAPS [id number]  Take 200 mg by mouth daily.  Patient not taking: Reported on 05/19/2020   Provider, Historical, Wilson  Active   BD INSULIN SYRINGE U/F 31G X 5/16" 0.5 ML MISC [id number]  USE TO ADMINISTER INSULIN 3 TIMES DAILY WITH MEALS Emily Wilson  Active   diclofenac (VOLTAREN) 75 MG EC tablet [id number] Yes Take 75 mg by mouth 2 (two) times daily as needed. Provider, Historical, Wilson Taking Active   ferrous sulfate 325 (65 FE) MG tablet [id number] No Take 325 mg by mouth daily with breakfast.  Patient not taking: No sig reported   Provider, Historical, Wilson Not Taking Active   gabapentin (NEURONTIN) 100 MG capsule [id number] Yes Take 3 capsules (300 mg total) by mouth 3 (three) times daily. Emily Wilson Taking Active            Med Note (ROBB, MELANIE A   Wed Jan 16, 2020 10:50 AM) Taking as needed  HUMULIN N 100 UNIT/ML injection [id number] Yes INJECT 0.12 MLS (12 UNITS TOTAL) INTO THE SKIN AT BEDTIME. Emily Wilson Taking Active   insulin regular (HUMULIN R) 100 units/mL injection [id number] Yes Inject 0.12 mLs (12 Units total) into the skin 3 (three) times daily before meals. Emily Wilson. Hold dose for blood sugar less than 125 Emily Wilson Taking Active   letrozole [facility]) 2.5 MG tablet [id number] Yes TAKE 1 TABLET BY MOUTH EVERY DAY Emily Wilson Taking Active   metoprolol succinate (TOPROL-XL) 25 MG 24 hr tablet [id number] Yes TAKE 1 TABLET BY MOUTH EVERY DAY [name], Wilson Taking Active   Milk Thistle 1000 MG CAPS [id number] Yes Take 1,000 mg by mouth daily. Provider, Historical, Wilson Taking Active   mirabegron ER (MYRBETRIQ) 50 MG TB24 tablet [id number] No Take 1 tablet (50 mg total) by mouth daily.   Patient not taking: No sig reported   [name], Wilson Not Taking Active   Multiple Vitamin (MULTIVITAMIN WITH MINERALS) TABS tablet [id number] Yes Take 1 tablet by mouth daily. Provider, Historical, Wilson Taking Active Self  palbociclib (IBRANCE) 125 MG tablet [id number] Yes TAKE 1 TABLET (125 MG TOTAL) BY MOUTH DAILY. TAKE FOR 21 DAYS ON, 7 DAYS OFF, REPEAT EVERY 28 DAYS. Emily Wilson Taking Active   SUPER B COMPLEX/C PO [id number] No Take 1 tablet by mouth daily.  Patient not taking: No sig reported   Provider, Historical, Wilson Not Taking Active   Vitamin D, Ergocalciferol, (DRISDOL) 1.25 MG (50000 UNIT) CAPS capsule [id number] Yes Take 1 capsule by mouth once a week. [name], Wilson Taking Active   Med List Note Marc Morgans, Verdene Lennert, [facility]  10/02/18 1151): Ibrance filled out [facility]          Patient Active Problem List   Diagnosis Date Noted  . History of shingles 01/09/2019  . S/P mastectomy, right 11/16/2018  . Bone metastasis ([place]) 05/31/2018  . Family history of breast cancer in female 03/13/2015  . Malignant neoplasm of lower-inner quadrant of right breast of female, estrogen receptor positive ([place]) 02/25/2015  . DM (diabetes mellitus), type 2 ([place]) 02/25/2015  . HLD (hyperlipidemia) 02/25/2015  . Palpitations 01/26/2010  . Anxiety state 05/26/2009  . Hyperthyroidism 11/14/2006  . Hypertension 11/14/2006    Conditions to be addressed/monitored: HTN and DM  Patient Care Plan: Diabetes Management    Problem Identified: Glycemic Management (Diabetes, Type 2)     Long-Range Goal: Glycemic Management Optimized   Start Date: 01/16/2020  Expected End Date: 07/23/2020  This Visit's Progress: On track  Recent Progress: On track  Priority: High  Note:    Current Barriers:  . Chronic Disease Management support and education needs related to Diabetes . Ms Vaughan manages her diabetes by eating a healthy, plant based diet, checking blood sugars 3-4  times daily, attending routine screening appointments, and walking on days when the weather is nice.-Update- Ms. Bass was + Covid in February, feeling better, continues to have mild cough and runny nose. She is experiencing muscle weakness due to [place]. Plans to purchase a device to help open jars when she gets paid. Recent A1C 7.7, admits to liking Reece cups, otherwise adheres to a healthy diet. Denies any needs at this time.  Nurse Case Manager Clinical Goal(s):  Marland Kitchen Over the next 60 days, patient will meet with Wilson Care Manager to address barriers to managing medical care-Met-Ms Banwart reports not barriers at this time . Over the next 60 days, the patient will demonstrate ongoing self health care management ability as evidenced by improving A1C-Next PCP appointment 06/30/20  Interventions:  . Inter-disciplinary care team collaboration (see longitudinal plan of care) . Evaluation of current treatment plan related to Diabetes and patient's adherence to plan as established by provider. . Discussed plans with patient for ongoing care management follow up and provided patient with direct contact information for care management team . Reviewed scheduled/upcoming provider appointments  . Advised patient, providing education and rationale, to check cbg as directed by Dr. Juleen China and record, calling Dr. Juleen China for findings outside established parameters.   . Provided information on Wilson rich foods . Collaborate with [facility] pharmacist for medication management  Patient Goals/Self-Care Activities Over the next 60 days, patient will: - Self administers medications as prescribed - Attends all scheduled provider appointments - Calls pharmacy for medication refills - Calls provider office for new concerns or questions - check blood sugar at prescribed times - check blood sugar if I feel it is too high or too low - enter blood sugar readings and medication or insulin into daily log - take the blood sugar  log to all doctor visits - take the blood sugar meter to all doctor visits -continue your diet of plant based food choices, limiting carbohydrates and sugars -exercise daily as tolerated  Follow Up Plan: Telephone follow up appointment with Managed Medicaid care management team member scheduled for:07/23/20 @ 10:30am     Patient Care Plan: Medication Management    Problem Identified: Health Promotion or Disease Self-Management (General Plan of Care)     Goal: Medication Management   Note:   Current Barriers:  . Does not contact  provider office for questions/concerns .   Pharmacist Clinical Goal(s):  Marland Kitchen Over the next 90 days, patient will contact provider office for questions/concerns as evidenced notation of same in electronic health record through collaboration with PharmD and provider.  .   Interventions: . Inter-disciplinary care team collaboration (see longitudinal plan of care) . Comprehensive medication review performed; medication list updated in electronic medical record  _0 @ _1 @ _2 @  Patient Goals/Self-Care Activities . Over the next 90 days, patient will:  - collaborate with provider on medication access solutions  Follow Up Plan: The care management team will reach out to the patient again over the next 90 days.     Task: Mutually Develop and Royce Macadamia Achievement of Patient Goals   Note:   Care Management Activities:    - verbalization of feelings encouraged    Notes:      Medication Assistance: None required. Patient affirms current coverage meets needs.   Follow up: Agree   Plan: The care management team will reach out to the patient again over the next 90 days.   [place] Constable, Pharm.D., Managed Medicaid Pharmacist - [phone]

## 2020-06-30 NOTE — Addendum Note (Signed)
Addended by: Melina Schools on: 06/30/2020 02:09 PM   Modules accepted: Orders

## 2020-06-30 NOTE — Progress Notes (Addendum)
Virtual Visit via Telephone Note  I connected with Emily Wilson, on 06/30/2020 at 10:05 AM by telephone due to the COVID-19 pandemic and verified that I am speaking with the correct person using two identifiers.   Consent: I discussed the limitations, risks, security and privacy concerns of performing an evaluation and management service by telephone and the availability of in person appointments. I also discussed with the patient that there may be a patient responsible charge related to this service. The patient expressed understanding and agreed to proceed.   Location of Patient: Home   Location of Provider: Clinic    Persons participating in Telemedicine visit: Roise Emert  Dr. Juleen China    History of Present Illness: Patient has a visit for follow up. Needs refills for her diabetic monitoring supplies. Reports that she has seen fasting CBGs as low as in the 80s. Reports that she is mostly compliant with her medications but that sometimes if she has a meal out she has forgotten to bring a new insulin pen.   Also has a history of HLD. She has been unable to take a statin due to her Leslee Home being processed through the liver. Her pharmacist has recommended another medication for cholesterol control that processes through kidney. She is unsure of the medication but has his phone number and plans to follow up.   Past Medical History:  Diagnosis Date  . Anxiety state, unspecified   . Arthritis   . Breast cancer of lower-inner quadrant of right female breast ([place])   . Carpal tunnel syndrome   . Complication of anesthesia    woke up during ganglion cyst removal in her 20's, not woken up since  . Diabetes mellitus without complication ([place])    Type II  . Heart murmur   . History of radiation therapy 12/18/18- 01/30/19   Right Chest wall 25 fractions X 2Gy each to total 50 Gy, followed by a boost 10 Gy in 5 fractions.   . Hyperlipidemia   . Hyperthyroidism   .  Mitral valve prolapse   . Smoker   . Tachycardia   . 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 Doxycycline Shortness Of Breath    Wheezing, shortness of breath, rash head to toe, and swelling  . Naproxen Shortness Of Breath  . Codeine Hives  . Hydrocodone-Acetaminophen Hives  . Ibuprofen     Irritant to stomach r/t diverticulitis  . Lantus [Insulin Glargine]     Yeast Infections  . Meloxicam Other (See Comments)    Abdominal pain   . Propoxyphene N-Acetaminophen Hives  . Sulfonamide Derivatives Hives  . Tramadol Other (See Comments)    Abdominal Pain    Current Outpatient Medications on File Prior to Visit  Medication Sig Dispense Refill  . diclofenac (VOLTAREN) 75 MG EC tablet Take 75 mg by mouth 2 (two) times daily as needed.    . gabapentin (NEURONTIN) 100 MG capsule Take 3 capsules (300 mg total) by mouth 3 (three) times daily. 90 capsule 1  . HUMULIN N 100 UNIT/ML injection INJECT 0.12 MLS (12 UNITS TOTAL) INTO THE SKIN AT BEDTIME. 10 mL 1  . insulin regular (HUMULIN R) 100 units/mL injection Inject 0.12 mLs (12 Units total) into the skin 3 (three) times daily before meals. E.11.9. Hold dose for blood sugar less than 125 10 mL 11  . letrozole (FEMARA) 2.5 MG tablet  TAKE 1 TABLET BY MOUTH EVERY DAY 90 tablet 0  . metoprolol succinate (TOPROL-XL) 25 MG 24 hr tablet TAKE 1 TABLET BY MOUTH EVERY DAY 90 tablet 3  . Milk Thistle 1000 MG CAPS Take 1,000 mg by mouth daily.    . Multiple Vitamin (MULTIVITAMIN WITH MINERALS) TABS tablet Take 1 tablet by mouth daily.    . palbociclib (IBRANCE) 125 MG tablet TAKE 1 TABLET (125 MG TOTAL) BY MOUTH DAILY. TAKE FOR 21 DAYS ON, 7 DAYS OFF, REPEAT EVERY 28 DAYS. 21 tablet 3  . Vitamin D, Ergocalciferol, (DRISDOL) 1.25 MG (50000 UNIT) CAPS capsule Take 1 capsule by mouth once a week.    . Accu-Chek FastClix Lancets MISC USE TO TEST BLOOD SUGAR UP  TO FOUR TIMES DAILY AS DIRECTED 204 each 2  . ACCU-CHEK GUIDE test strip USE TO CHECK BLOOD SUGAR UP TO 4 TIMES A DAY. DX: E11.65 200 strip 2  . Alpha-Lipoic Acid 200 MG CAPS Take 200 mg by mouth daily. (Patient not taking: Reported on 05/19/2020)    . BD INSULIN SYRINGE U/F 31G X 5/16" 0.5 ML MISC USE TO ADMINISTER INSULIN 3 TIMES DAILY WITH MEALS 300 each 2  . ferrous sulfate 325 (65 FE) MG tablet Take 325 mg by mouth daily with breakfast. (Patient not taking: No sig reported)    . mirabegron ER (MYRBETRIQ) 50 MG TB24 tablet Take 1 tablet (50 mg total) by mouth daily. (Patient not taking: No sig reported) 30 tablet 11  . SUPER B COMPLEX/C PO Take 1 tablet by mouth daily. (Patient not taking: No sig reported)     No current facility-administered medications on file prior to visit.    Observations/Objective: NAD. Speaking clearly.  Work of breathing normal.  Alert and oriented. Mood appropriate.   Assessment and Plan: 1. Type 2 diabetes mellitus without complication, with long-term current use of insulin (HCC) Last A1c 7.7 Glucose much more optimally controlled on home readings. Continue current regimen.  - glucose blood (ACCU-CHEK GUIDE) test strip; USE TO CHECK BLOOD SUGAR UP TO 4 TIMES A DAY. DX: E11.65  Dispense: 200 strip; Refill: 2 - BD INSULIN SYRINGE U/F 31G X 5/16" 0.5 ML MISC; USE TO ADMINISTER INSULIN 3 TIMES DAILY WITH MEALS  Dispense: 300 each; Refill: 2 - Accu-Chek FastClix Lancets MISC; USE TO TEST BLOOD SUGAR UP TO FOUR TIMES DAILY AS DIRECTED  Dispense: 204 each; Refill: 2  2. Hyperlipidemia, unspecified hyperlipidemia type Reviewed with RPH that would recommend Crestor due to no interactions with Ibrance. Will prescribe high intensity statin given history of DM despite fairly well controlled ASCVD risk.   The 10-year ASCVD risk score Mikey Bussing DC Brooke Bonito., et al., 2013) is: 8.1%   Values used to calculate the score:     Age: 55 years     Sex: Female     Is Non-Hispanic African  American: No     Diabetic: Yes     Tobacco smoker: No     Systolic Blood Pressure: 235 mmHg     Is BP treated: Yes     HDL Cholesterol: 34 mg/dL     Total Cholesterol: 235 mg/dL   Follow Up Instructions: 3 month f/u    I discussed the assessment and treatment plan with the patient. The patient was provided an opportunity to ask questions and all were answered. The patient agreed with the plan and demonstrated an understanding of the instructions.   The patient was advised to call back or seek an in-person  evaluation if the symptoms worsen or if the condition fails to improve as anticipated.     I provided 9 minutes total of non-face-to-face time during this encounter including median intraservice time, reviewing previous notes, investigations, ordering medications, medical decision making, coordinating care and patient verbalized understanding at the end of the visit.    Phill Myron, D.O. Primary Care at [facility]  06/30/2020, 10:05 AM

## 2020-06-30 NOTE — Progress Notes (Signed)
RF- syringes   Discuss medications- Cholesterol and Ibrance contraindications

## 2020-07-01 ENCOUNTER — Other Ambulatory Visit (HOSPITAL_COMMUNITY): Payer: Self-pay

## 2020-07-02 ENCOUNTER — Other Ambulatory Visit (HOSPITAL_COMMUNITY): Payer: Self-pay

## 2020-07-03 ENCOUNTER — Other Ambulatory Visit (HOSPITAL_COMMUNITY): Payer: Self-pay

## 2020-07-22 ENCOUNTER — Telehealth: Payer: Self-pay | Admitting: Internal Medicine

## 2020-07-22 ENCOUNTER — Other Ambulatory Visit: Payer: Self-pay | Admitting: Internal Medicine

## 2020-07-22 DIAGNOSIS — E1159 Type 2 diabetes mellitus with other circulatory complications: Secondary | ICD-10-CM

## 2020-07-22 DIAGNOSIS — Z794 Long term (current) use of insulin: Secondary | ICD-10-CM

## 2020-07-22 NOTE — Telephone Encounter (Signed)
Pt stated when she went to the pharmacy to pick up her medications she did not get the HUMULIN N 100 UNIT/ML injection . Pt would like that medication called in at  Pharmacy  [id number] - [place], [address]  [place], [place] [address]  Phone:  [phone] Fax:  [phone]  DEA #:  [id number]

## 2020-07-23 ENCOUNTER — Other Ambulatory Visit: Payer: Self-pay

## 2020-07-23 ENCOUNTER — Other Ambulatory Visit: Payer: Self-pay | Admitting: *Deleted

## 2020-07-23 ENCOUNTER — Other Ambulatory Visit: Payer: Medicare Other | Admitting: *Deleted

## 2020-07-23 ENCOUNTER — Encounter: Payer: Self-pay | Admitting: Hematology and Oncology

## 2020-07-23 MED ORDER — INSULIN NPH (HUMAN) (ISOPHANE) 100 UNIT/ML ~~LOC~~ SUSP: SUBCUTANEOUS | 1 refills | Status: DC

## 2020-07-23 NOTE — Patient Instructions (Signed)
Visit Information  [name]  - as a part of your Medicaid benefit, you are eligible for care management and care coordination services at no cost or copay. I was unable to reach you by phone today but would be happy to help you with your health related needs. Please feel free to call me @ [phone].   A member of the Managed Medicaid care management team will reach out to you again over the next 7-14 days.   Lurena Joiner RN, BSN [place]  [employer] manager

## 2020-07-23 NOTE — Patient Instructions (Signed)
Visit Information  Emily. Emily Wilson was given information about Medicaid Managed Care team care coordination services as a part of their Healthy Blue Medicaid benefit. Emily Wilson verbally consented to engagement with the [facility] Managed Care team.   For questions related to your Healthy [facility] health plan, please call: [phone] or visit the homepage here: [email or web address]  If you would like to schedule transportation through your Healthy [facility] plan, please call the following number at least 2 days in advance of your appointment: [phone]   Call the [place] at [phone], at any time, 24 hours a day, 7 days a week. If you are in danger or need immediate medical attention call 911.  Emily Wilson - following are the goals we discussed in your visit today:  Goals Addressed            This Visit's Progress   . Monitor and Manage My Blood Sugar       Follow up 09/30/20     -check blood sugars as PCP recommends  -participate in light exercise daily  -continue to eat a plant based, low carb diet, increase protein rich foods  -attend all scheduled appointments  -limit Reece cup intake, increase protein intake    . Set My Target A1C       Follow up 09/30/20  - set target A1C -Patient and PCP would like for A1C to be 7.0 or less          Please see education materials related to protein rich foods provided as print materials.     Protein Content in Foods Protein is a necessary nutrient in any diet. It helps build and repair muscles, bones, and skin. Depending on your overall health, you may need more or less protein in your diet. You are encouraged to eat a variety of protein foods to ensure that you get all the essential nutrients that are found in different protein foods. Talk to your health care provider or dietitian about how much protein you need each day and which sources of protein are best for  you. Protein is especially important for:  Repairing and making cells and tissues.  Fighting infection.  Energy.  Growth and development. See the following list for the protein content of some common foods. What are tips for getting more protein in your diet?  Try to replace processed carbohydrates with high-quality protein.  Snack on nuts and seeds instead of chips.  Replace baked desserts with [place] yogurt.  Eat protein foods from both plant and animal sources.  Replace red meat with seafood choices. What foods are high in proteins? High-protein foods contain 4 grams (g) or more of protein per serving. They include: Meat protein  Beef, ground sirloin (cooked) -- 3 oz (85 g) have 24 g of protein.  Chicken breast, boneless and skinless (cooked) -- 3 oz (85 g) have 25 g of protein.  Egg -- 1 egg has 6 g of protein.  Fish, filet (cooked) -- 1 oz (28 g) has 6-7 g of protein.  Lamb (cooked) -- 3 oz (85 g) has 24 g of protein.  Pork tenderloin (cooked) -- 3 oz (85 g) has 23 g of protein.  Tuna (canned in water) -- 3 oz (85 g) has 20 g of protein. Dairy  Cottage cheese -- 1/2 cup (114 g) has 13.4 g of protein.  Milk -- 1 cup (250 mL) has 8 g of protein.  Cheese (hard) -- 1 oz (28 g)  has 7 g of protein.  Yogurt, regular -- 6 oz (170 g) has 8 g of protein.  Greek yogurt -- 6 oz (200 g) has 18 g protein. Plant protein  Garbanzo beans (canned or cooked) -- 1/2 cup (130 g) has 6-7 g of protein.  Kidney beans (canned or cooked) -- 1/2 cup (130 g) has 6-7 g of protein.  Nuts (peanuts, pistachios, almonds) -- 1 oz (28 g) has 6 g of protein.  Peanut butter -- 1 oz (32 g) has 7-8 g of protein.  Pumpkin seeds -- 1 oz (28 g) has 8.5 g of protein.  Soybeans (roasted) -- 1 oz (28 g) has 8 g of protein.  Soybeans (cooked) -- 1/2 cup (90 g) has 11 g of protein.  Soy milk -- 1 cup (250 mL) has 5-10 g of protein.  Soy or vegetable patty -- 1 patty has 11 g of  protein.  Sunflower seeds -- 1 oz (28 g) has 5.5 g of protein.  Tofu (firm) -- 1/2 cup (124 g) has 20 g of protein.  Tempeh -- 1/2 cup (83 g) has 16 g of protein. The items listed above may not be a complete list of foods high in protein. Actual amounts of protein may differ depending on processing. Contact a dietitian for more information.   What foods are low in protein? Low-protein foods contain 3 grams (g) or less of protein per serving. They include: Fruits  Fruit or vegetable juice -- 1/2 cup (125 mL) has 1 g of protein. Vegetables  Beets (raw or cooked) -- 1/2 cup (68 g) has 1.5 g of protein.  Broccoli (raw or cooked) -- 1/2 cup (44 g) has 2 g of protein.  Collard greens (raw or cooked) -- 1/2 cup (42 g) has 2 g of protein.  Green beans (raw or cooked) -- 1/2 cup (83 g) has 1 g of protein.  Green peas (canned) -- 1/2 cup (80 g) has 3.5 g of protein.  Potato (baked with skin) -- 1 medium potato (173 g) has 3 g of protein.  Spinach (cooked) -- 1/2 cup (90 g) has 3 g of protein.  Squash (cooked) -- 1/2 cup (90 g) has 1.5 g of protein.  Avocado -- 1 cup (146 g) has 2.7 g of protein. Grains  Bran cereal -- 1/2 cup (30 g) has 2-3 g of protein.  Bread -- 1 slice has 2.5 g of protein.  Corn (fresh or cooked) -- 1/2 cup (77 g) has 2 g of protein.  Flour tortilla -- One 6-inch (15 cm) tortilla has 2.5 g of protein.  Muffins -- 1 small muffin (2 oz or 57 g) has 3 g of protein.  Oatmeal (cooked) -- 1/2 cup (40 g) has 3 g of protein.  Rice (cooked) -- 1/2 cup (79 g) has 2.5-3.5 g of protein. Dairy  Cream cheese -- 1 oz (29 g) has 2 g of protein.  Creamer (half-and-half) -- 1 oz (29 mL) has 1 g of protein.  Frozen yogurt -- 1/2 cup (72 g) has 3 g of protein.  Sour cream -- 1/2 cup (75 g) has 2.5 g of protein. The items listed above may not be a complete list of foods low in protein. Actual amounts of protein may differ depending on processing. Contact a dietitian for  more information.   Summary  Protein is a nutrient that your body needs for growth and development, repairing and making cells and tissues, fighting infection, and providing  energy.  Protein is in both plant and animal foods. Some of these foods have more protein than others.  Depending on your overall health, you may need more or less protein in your diet. Talk to your health care provider about how much protein you need. This information is not intended to replace advice given to you by your health care provider. Make sure you discuss any questions you have with your health care provider. Document Revised: 02/07/2019 Document Reviewed: 02/07/2019 Elsevier Patient Education  2021 [employer].   The patient verbalized understanding of instructions provided today and agreed to receive a mailed copy of patient instruction and/or educational materials.  Telephone follow up appointment with Managed Medicaid care management team member scheduled for:09/30/20 @ 10:30pm  Lurena Joiner RN, BSN [place] RN Care Coordinator   Following is a copy of your plan of care:  Patient Care Plan: Diabetes Management    Problem Identified: Glycemic Management (Diabetes, Type 2)     Long-Range Goal: Glycemic Management Optimized   Start Date: 01/16/2020  Expected End Date: 09/30/2020  This Visit's Progress: On track  Recent Progress: On track  Priority: High  Note:    Current Barriers:  . Chronic Disease Management support and education needs related to Diabetes . Emily Wilson manages her diabetes by eating a healthy, plant based diet, checking blood sugars 3-4 times daily, attending routine screening appointments, and walking on days when the weather is nice. Emily Wilson was + Covid in February, feeling better, continues to have mild cough and runny nose. She is experiencing muscle weakness due to [place]. Plans to purchase a device to help open jars when she gets paid. Recent  A1C 7.7, admits to liking Reece cups, otherwise adheres to a healthy diet. Denies any needs at this time.Update-Continues to have increased fatigue, unsure if this is related to radiation side effects. Plans to discuss with Oncology 08/04/20. She is getting exercise by working in her garden and enjoys eating the fresh vegetables. PCP recently increased insulin dose and she feels like her readings are improved. BS reading today 158. Nurse Case Manager Clinical Goal(s):  . patient will meet with RN Care Manager to address barriers to managing medical care-Met-Emily Wilson reports not barriers at this time . the patient will demonstrate ongoing self health care management ability as evidenced by improving A1C Interventions:  . Inter-disciplinary care team collaboration (see longitudinal plan of care) . Evaluation of current treatment plan related to Diabetes and patient's adherence to plan as established by provider. . Discussed plans with patient for ongoing care management follow up and provided patient with direct contact information for care management team . Reviewed scheduled/upcoming provider appointments . Advised patient, providing education and rationale, to check cbg as directed by Dr. Juleen China and record, calling Dr. Juleen China for findings outside established parameters.   . Provided information on Wilson rich foods . Discussed increasing protein intake . Recommended exercise 30 min/day for 5 days a week Patient Goals/Self-Care Activities - Self administers medications as prescribed - Attends all scheduled provider appointments - [place] for medication refills - Calls provider office for new concerns or questions - check blood sugar at prescribed times - check blood sugar if I feel it is too high or too low - enter blood sugar readings and medication or insulin into daily log - take the blood sugar log to all doctor visits - take the blood sugar meter to all doctor visits -continue  your diet  of plant based food choices, limiting carbohydrates and sugars -exercise daily as tolerated -limit Reece cup intake, increase protein intake  Follow Up Plan: Telephone follow up appointment with Managed Medicaid care management team member scheduled for:09/30/20 @ 10:30am     Patient Care Plan: Medication Management    Problem Identified: Health Promotion or Disease Self-Management (General Plan of Care)     Goal: Medication Management   Note:   Current Barriers:  . Does not contact provider office for questions/concerns .   Pharmacist Clinical Goal(s):  Marland Kitchen Over the next 90 days, patient will contact provider office for questions/concerns as evidenced notation of same in electronic health record through collaboration with PharmD and provider.  .   Interventions: . Inter-disciplinary care team collaboration (see longitudinal plan of care) . Comprehensive medication review performed; medication list updated in electronic medical record  _0 @ _1 @ _2 @  Patient Goals/Self-Care Activities . Over the next 90 days, patient will:  - collaborate with provider on medication access solutions  Follow Up Plan: The care management team will reach out to the patient again over the next 90 days.

## 2020-07-23 NOTE — Patient Outreach (Signed)
Medicaid Managed Care   Nurse Care Manager Note  07/23/2020 Name:  Emily Wilson MRN:  [id number] DOB:  1965-10-29  Emily Wilson is an 55 y.o. year old female who is a primary patient of [name], DO.  The [facility] Managed Care Coordination team was consulted for assistance with:    DMII  [name] was given information about Medicaid Managed Care Coordination team services today. Emily Wilson agreed to services and verbal consent obtained.  Engaged with patient by telephone for follow up visit in response to provider referral for case management and/or care coordination services.   Assessments/Interventions:  Review of past medical history, allergies, medications, health status, including review of consultants reports, laboratory and other test data, was performed as part of comprehensive evaluation and provision of chronic care management services.  SDOH (Social Determinants of Health) assessments and interventions performed:   Care Plan  Allergies  Allergen Reactions  . Doxycycline Shortness Of Breath    Wheezing, shortness of breath, rash head to toe, and swelling  . Naproxen Shortness Of Breath  . Codeine Hives  . Hydrocodone-Acetaminophen Hives  . Ibuprofen     Irritant to stomach r/t diverticulitis  . Lantus [Insulin Glargine]     Yeast Infections  . Meloxicam Other (See Comments)    Abdominal pain   . Propoxyphene N-Acetaminophen Hives  . Sulfonamide Derivatives Hives  . Tramadol Other (See Comments)    Abdominal Pain    Medications Reviewed Today    Reviewed by [name], RN (Registered Nurse) on 07/23/20 at 1352  Med List Status: <None>  Medication Order Taking? Sig Documenting Provider Last Dose Status Informant  Accu-Chek FastClix Lancets MISC [id number] Yes USE TO TEST BLOOD SUGAR UP TO FOUR TIMES DAILY AS DIRECTED [name], DO Taking Active   BD INSULIN SYRINGE U/F 31G X 5/16" 0.5 ML MISC [id number] Yes USE TO  ADMINISTER INSULIN 3 TIMES DAILY WITH MEALS [name], DO Taking Active   diclofenac (VOLTAREN) 75 MG EC tablet [id number] Yes Take 75 mg by mouth 2 (two) times daily as needed. [provider] Taking Active   gabapentin (NEURONTIN) 100 MG capsule [id number] No Take 3 capsules (300 mg total) by mouth 3 (three) times daily.  Patient not taking: Reported on 07/23/2020   [name], MD Not Taking Active            Med Note (Frederika Hukill A   Wed Jan 16, 2020 10:50 AM) Taking as needed  glucose blood (ACCU-CHEK GUIDE) test strip [id number] Yes USE TO CHECK BLOOD SUGAR UP TO 4 TIMES A DAY. DX: E11.65 [name], DO Taking Active   insulin NPH Human (HUMULIN N) 100 UNIT/ML injection [id number] Yes INJECT 12 UNITS INTO THE SKIN AT BEDTIME [name], DO Taking Active   insulin regular (HUMULIN R) 100 units/mL injection [id number] Yes Inject 0.12 mLs (12 Units total) into the skin 3 (three) times daily before meals. E.11.9. Hold dose for blood sugar less than 125 [name], DO Taking Active   letrozole [facility]) 2.5 MG tablet [id number] Yes TAKE 1 TABLET BY MOUTH EVERY DAY [name], MD Taking Active   metoprolol succinate (TOPROL-XL) 25 MG 24 hr tablet [id number] Yes TAKE 1 TABLET BY MOUTH EVERY DAY [name], MD Taking Active   Milk Thistle 1000 MG CAPS [id number] Yes Take 1,000 mg by mouth daily. [provider] Taking Active   Multiple Vitamin (MULTIVITAMIN WITH MINERALS) TABS  tablet [id number] Yes Take 1 tablet by mouth daily. [provider] Taking Active Self  palbociclib (IBRANCE) 125 MG tablet [id number] Yes TAKE 1 TABLET (125 MG TOTAL) BY MOUTH DAILY. TAKE FOR 21 DAYS ON, 7 DAYS OFF, REPEAT EVERY 28 DAYS. [name], MD Taking Active   rosuvastatin (CRESTOR) 40 MG tablet [id number] Yes Take 1 tablet (40 mg total) by mouth daily. [name], DO Taking Active   Vitamin D, Ergocalciferol,  (DRISDOL) 1.25 MG (50000 UNIT) CAPS capsule [id number] Yes Take 1 capsule by mouth once a week. [name], MD Taking Active   Med List Note [name], [facility] 10/02/18 1151): Ibrance filled out [place]          Patient Active Problem List   Diagnosis Date Noted  . History of shingles 01/09/2019  . S/P mastectomy, right 11/16/2018  . Bone metastasis ([place]) 05/31/2018  . Family history of breast cancer in female 03/13/2015  . Malignant neoplasm of lower-inner quadrant of right breast of female, estrogen receptor positive ([place]) 02/25/2015  . DM (diabetes mellitus), type 2 ([place]) 02/25/2015  . HLD (hyperlipidemia) 02/25/2015  . Palpitations 01/26/2010  . Anxiety state 05/26/2009  . Hyperthyroidism 11/14/2006  . Hypertension 11/14/2006    Conditions to be addressed/monitored per PCP order:  DMII  Care Plan : Diabetes Management  Updates made by [name], RN since 07/23/2020 12:00 AM    Problem: Glycemic Management (Diabetes, Type 2)     Long-Range Goal: Glycemic Management Optimized   Start Date: 01/16/2020  Expected End Date: 09/30/2020  This Visit's Progress: On track  Recent Progress: On track  Priority: High  Note:    Current Barriers:  . Chronic Disease Management support and education needs related to Diabetes . [name] manages her diabetes by eating a healthy, plant based diet, checking blood sugars 3-4 times daily, attending routine screening appointments, and walking on days when the weather is nice. [name] was + Covid in February, feeling better, continues to have mild cough and runny nose. She is experiencing muscle weakness due to [place]. Plans to purchase a device to help open jars when she gets paid. Recent A1C 7.7, admits to liking Reece cups, otherwise adheres to a healthy diet. Denies any needs at this time.Update-Continues to have increased fatigue, unsure if this is related to radiation side effects. Plans to discuss  with Oncology 08/04/20. She is getting exercise by working in her garden and enjoys eating the fresh vegetables. PCP recently increased insulin dose and she feels like her readings are improved. BS reading today 158. Nurse Case Manager Clinical Goal(s):  . patient will meet with RN Care Manager to address barriers to managing medical care-Met-Ms Galiano reports not barriers at this time . the patient will demonstrate ongoing self health care management ability as evidenced by improving A1C Interventions:  . Inter-disciplinary care team collaboration (see longitudinal plan of care) . Evaluation of current treatment plan related to Diabetes and patient's adherence to plan as established by provider. . Discussed plans with patient for ongoing care management follow up and provided patient with direct contact information for care management team . Reviewed scheduled/upcoming provider appointments . Advised patient, providing education and rationale, to check cbg as directed by Dr. Juleen China and record, calling Dr. Juleen China for findings outside established parameters.   . Provided information on [place] rich foods . Discussed increasing protein intake . Recommended exercise 30 min/day for 5 days a week Patient Goals/Self-Care Activities -  Self administers medications as prescribed - Attends all scheduled provider appointments - Calls pharmacy for medication refills - Calls provider office for new concerns or questions - check blood sugar at prescribed times - check blood sugar if I feel it is too high or too low - enter blood sugar readings and medication or insulin into daily log - take the blood sugar log to all doctor visits - take the blood sugar meter to all doctor visits -continue your diet of plant based food choices, limiting carbohydrates and sugars -exercise daily as tolerated -limit Reece cup intake, increase protein intake  Follow Up Plan: Telephone follow up appointment with Managed  Medicaid care management team member scheduled for:09/30/20 @ 10:30am       Follow Up:  Patient agrees to Care Plan and Follow-up.  Plan: The Managed Medicaid care management team will reach out to the patient again over the next 30 days.  Date/time of next scheduled RN care management/care coordination outreach: 09/30/20 @ 10:30am  Lurena Joiner RN, [place] RN Care Coordinator

## 2020-07-23 NOTE — Patient Outreach (Signed)
Care Coordination  07/23/2020  Emily Wilson 25-Apr-1965 [id number]    Medicaid Managed Care   Unsuccessful Outreach Note  07/23/2020 Name: Emily Wilson MRN: [id number] DOB: 1965-06-14  Referred by: [name], DO Reason for referral : High Risk Managed Medicaid (Unsuccessful RNCM follow up outreach)   An unsuccessful telephone outreach was attempted today. The patient was referred to the case management team for assistance with care management and care coordination.   Follow Up Plan: A HIPAA compliant phone message was left for the patient providing contact information and requesting a return call.  The care management team will reach out to the patient again over the next 7-14 days.   Emily Joiner RN, BSN [place]  [employer] manager

## 2020-07-23 NOTE — Addendum Note (Signed)
Addended by: Carilyn Goodpasture on: 07/23/2020 11:48 AM   Modules accepted: Orders

## 2020-07-25 ENCOUNTER — Other Ambulatory Visit (HOSPITAL_COMMUNITY): Payer: Self-pay

## 2020-07-30 ENCOUNTER — Other Ambulatory Visit (HOSPITAL_COMMUNITY): Payer: Self-pay

## 2020-07-30 ENCOUNTER — Encounter: Payer: Self-pay | Admitting: Hematology and Oncology

## 2020-07-31 ENCOUNTER — Other Ambulatory Visit (HOSPITAL_COMMUNITY): Payer: Self-pay

## 2020-07-31 ENCOUNTER — Encounter: Payer: Self-pay | Admitting: Hematology and Oncology

## 2020-08-02 NOTE — Progress Notes (Signed)
Patient Care Team: [name], DO as PCP - General (Family Medicine) [name], RN as Case Manager Lane Hacker, [facility] as Pharmacist (Pharmacist)  DIAGNOSIS:    ICD-10-CM   1. Bone metastasis ([place])  C79.51     2. Malignant neoplasm of lower-inner quadrant of right breast of female, estrogen receptor positive ([place])  C50.311    Z17.0       SUMMARY OF ONCOLOGIC HISTORY: Oncology History  Malignant neoplasm of lower-inner quadrant of right breast of female, estrogen receptor positive ([place])  02/19/2015 Mammogram   Right breast irregular mass lower inner quadrant 2.2 x 1.3 x 1.7 cm with architectural distortion and skin/nipple retraction, T2 N0 stage II a clinical stage, additional benign cysts largest 1.2 cm    02/20/2015 Initial Diagnosis   Right breast biopsy 5:00: Invasive ductal carcinoma grade 2, perineural invasion present, ER 90%, PR 70%, HER-2 negative ratio 0.95, Ki-67 15%     03/07/2015 Breast MRI   right breast inferior subareolar mass measuring 2.2 x 2.4 x 2.5 cm with skin and nipple enhancement, extending inferiorly and laterally is intraductal enhancement over a distance of 4 cm, several level I right axillary lymph nodes with cortical thickening    03/13/2015 -  Anti-estrogen oral therapy   Neoadjuvant antiestrogen therapy with tamoxifen 20 mg daily (because the patient did not want to undergo surgery immediately for multiple family reasons) stopped in late 2017; anastrozole started 05/11/18   05/23/2018 Imaging   CT CAP: 2.9 cm spiculated soft tissue density central right breast, no evidence of soft tissue metastatic disease within the chest abdomen pelvis.  Subcentimeter sclerotic bone lesions T10, left pubis, right posterior ilium Bone scan: Foci of uptake left frontal calvarium, T-spine, right ilium    10/04/2018 -  Anti-estrogen oral therapy   Ibrance with anastrozole   11/16/2018 Surgery   Right mastectomy Ninfa Linden): IDC, grade 1, 5.2cm,  grossly involving underlying skin, clear margins.   12/07/2018 Cancer Staging   Staging form: Breast, AJCC 7th Edition - Pathologic stage from 12/07/2018: Stage IV (yT4b, NX, M1) - Signed by Eppie Gibson, MD on 12/07/2018    12/19/2018 - 01/30/2019 Radiation Therapy   Adjuvant radiation   05/16/2019 Miscellaneous   [facility] molecular testing: ER positive, PI K3 CA mutation present, HER-2 negative, ER positive TMB low BRCA1 not detected, ESR 1 not detected, PD-L1 negative     CHIEF COMPLIANT: Follow-up of metastatic breast on Ibrance and anastrozole  INTERVAL HISTORY: Emily Wilson is a 55 y.o. with above-mentioned history of metastatic breast cancer currently on anastrozole and Ibrance. She reports to the clinic today for follow-up.  ALLERGIES:  is allergic to doxycycline, naproxen, codeine, hydrocodone-acetaminophen, ibuprofen, lantus [insulin glargine], meloxicam, propoxyphene n-acetaminophen, sulfonamide derivatives, and tramadol.  MEDICATIONS:  Current Outpatient Medications  Medication Sig Dispense Refill   Accu-Chek FastClix Lancets MISC USE TO TEST BLOOD SUGAR UP TO FOUR TIMES DAILY AS DIRECTED 204 each 2   BD INSULIN SYRINGE U/F 31G X 5/16" 0.5 ML MISC USE TO ADMINISTER INSULIN 3 TIMES DAILY WITH MEALS 300 each 2   diclofenac (VOLTAREN) 75 MG EC tablet Take 75 mg by mouth 2 (two) times daily as needed.     gabapentin (NEURONTIN) 100 MG capsule Take 3 capsules (300 mg total) by mouth 3 (three) times daily. (Patient not taking: Reported on 07/23/2020) 90 capsule 1   glucose blood (ACCU-CHEK GUIDE) test strip USE TO CHECK BLOOD SUGAR UP TO 4 TIMES A DAY. DX: E11.65  200 strip 2   insulin NPH Human (HUMULIN N) 100 UNIT/ML injection INJECT 12 UNITS INTO THE SKIN AT BEDTIME 10 mL 1   insulin regular (HUMULIN R) 100 units/mL injection Inject 0.12 mLs (12 Units total) into the skin 3 (three) times daily before meals. E.11.9. Hold dose for blood sugar less than 125 10 mL 11   letrozole  (FEMARA) 2.5 MG tablet TAKE 1 TABLET BY MOUTH EVERY DAY 90 tablet 0   metoprolol succinate (TOPROL-XL) 25 MG 24 hr tablet TAKE 1 TABLET BY MOUTH EVERY DAY 90 tablet 3   Milk Thistle 1000 MG CAPS Take 1,000 mg by mouth daily.     Multiple Vitamin (MULTIVITAMIN WITH MINERALS) TABS tablet Take 1 tablet by mouth daily.     palbociclib (IBRANCE) 125 MG tablet TAKE 1 TABLET (125 MG TOTAL) BY MOUTH DAILY. TAKE FOR 21 DAYS ON, 7 DAYS OFF, REPEAT EVERY 28 DAYS. 21 tablet 3   rosuvastatin (CRESTOR) 40 MG tablet Take 1 tablet (40 mg total) by mouth daily. 90 tablet 3   Vitamin D, Ergocalciferol, (DRISDOL) 1.25 MG (50000 UNIT) CAPS capsule Take 1 capsule by mouth once a week.     No current facility-administered medications for this visit.    PHYSICAL EXAMINATION: ECOG PERFORMANCE STATUS: 1 - Symptomatic but completely ambulatory  Vitals:   08/04/20 1129  BP: 133/80  Pulse: 84  Resp: 18  Temp: (!) 97.5 F (36.4 C)  SpO2: 99%   Filed Weights   08/04/20 1129  Weight: 151 lb 11.2 oz (68.8 kg)    BREAST: No palpable masses or nodules in either right or left breasts. No palpable axillary supraclavicular or infraclavicular adenopathy no breast tenderness or nipple discharge. (exam performed in the presence of a chaperone)  LABORATORY DATA:  I have reviewed the data as listed CMP Latest Ref Rng & Units 08/04/2020 04/24/2020 02/01/2020  Glucose 70 - 99 mg/dL 143(H) 143(H) 198(H)  BUN 6 - 20 mg/dL 21(H) 18 18  Creatinine 0.44 - 1.00 mg/dL 0.80 0.71 0.83  Sodium 135 - 145 mmol/L 141 138 137  Potassium 3.5 - 5.1 mmol/L 4.2 3.9 4.5  Chloride 98 - 111 mmol/L 108 108 103  CO2 22 - 32 mmol/L '24 22 26  ' Calcium 8.9 - 10.3 mg/dL 9.0 9.2 10.4(H)  Total Protein 6.5 - 8.1 g/dL 7.1 7.0 7.3  Total Bilirubin 0.3 - 1.2 mg/dL 0.4 0.4 0.5  Alkaline Phos 38 - 126 U/L 77 76 90  AST 15 - 41 U/L 16 12(L) 20  ALT 0 - 44 U/L '16 14 20    ' Lab Results  Component Value Date   WBC 5.9 08/04/2020   HGB 14.5 08/04/2020    HCT 42.6 08/04/2020   MCV 93.6 08/04/2020   PLT 298 08/04/2020   NEUTROABS 3.1 08/04/2020    ASSESSMENT & PLAN:  Malignant neoplasm of lower-inner quadrant of right breast of female, estrogen receptor positive ([place]) Right breast irregular mass lower inner quadrant retroareolar 02/19/2015: 2.2 x 1.3 x 1.7 cm with architectural distortion and skin/nipple retraction, T2 N0 stage II a clinical stage, additional benign cysts largest 1.2 cm Right breast biopsy 02/20/2015 5:00: Invasive ductal carcinoma grade 2, perineural invasion present, ER 90%, PR 70%, HER-2 negative ratio 0.95, Ki-67 15%     Breast MRI 03/07/2015: Subareolar mass 2.2 x 2.4 x 2.5 cm extending into the retracted nipple, extending inferiorly and laterally over a distance of 4 cm, several level I right axillary lymph nodes with cortical thickening  up to 7 mm with a maximum diameter of 15 mm   05/16/2019:Caris molecular testing: ER positive, PI K3 CA mutation present, HER-2 negative, ER positive TMB low BRCA1 not detected, ESR 1 not detected, PD-L1 negative --------------------------------------------------------------------- Treatment summary: Anastrozole with Delton See started April 2020 Ibrance added 09/29/2018 Bone scan: 09/20/2018: Progression of bone metastatic disease new metastases disease right lesser trochanter, mid left femoral diaphysis, additional bone mets involving left frontal, right iliac, right SI joint, right sixth rib, 12th rib, T12 transverse process and T10   Ibrance toxicities: No adverse effects previously She resumed Ibrance February 23, 2019   11/16/2018:Right mastectomy Ninfa Linden) performed because the tumor was fungating: IDC, grade 1, 5.2cm, grossly involving underlying skin, clear margins.   Radiation to chest wall: 12/19/18- 01/30/19 Bone scan 06/26/2019: Stable distribution of bone metastases. CT CAP 04/28/2020: Mixed lytic and sclerotic bone metastases several demonstrating increased lytic character concern for  worsening bone mets.     recheck CT scans in 3 months.  [facility] labs and follow-up every 3 months    No orders of the defined types were placed in this encounter.  The patient has a good understanding of the overall plan. she agrees with it. she will call with any problems that may develop before the next visit here.  Total time spent: 30 mins including face to face time and time spent for planning, charting and coordination of care  Rulon Eisenmenger, MD, MPH 08/04/2020  I, Thana Ates, am acting as scribe for Dr. [name].  I have reviewed the above documentation for accuracy and completeness, and I agree with the above.   Okay MAR did not think of her GI but B12 and vitamin D

## 2020-08-04 ENCOUNTER — Inpatient Hospital Stay (HOSPITAL_BASED_OUTPATIENT_CLINIC_OR_DEPARTMENT_OTHER): Payer: Medicare Other | Admitting: Hematology and Oncology

## 2020-08-04 ENCOUNTER — Other Ambulatory Visit: Payer: Self-pay

## 2020-08-04 ENCOUNTER — Inpatient Hospital Stay: Payer: Medicare Other | Attending: Medical

## 2020-08-04 ENCOUNTER — Telehealth: Payer: Self-pay | Admitting: Hematology and Oncology

## 2020-08-04 ENCOUNTER — Inpatient Hospital Stay: Payer: Medicare Other

## 2020-08-04 VITALS — BP 133/80 | HR 84 | Temp 97.5°F | Resp 18 | Ht 59.0 in | Wt 151.7 lb

## 2020-08-04 DIAGNOSIS — Z923 Personal history of irradiation: Secondary | ICD-10-CM | POA: Insufficient documentation

## 2020-08-04 DIAGNOSIS — C50311 Malignant neoplasm of lower-inner quadrant of right female breast: Secondary | ICD-10-CM | POA: Insufficient documentation

## 2020-08-04 DIAGNOSIS — Z9011 Acquired absence of right breast and nipple: Secondary | ICD-10-CM | POA: Insufficient documentation

## 2020-08-04 DIAGNOSIS — C7951 Secondary malignant neoplasm of bone: Secondary | ICD-10-CM

## 2020-08-04 DIAGNOSIS — Z170 Estrogen receptor positive status [ER+]: Secondary | ICD-10-CM

## 2020-08-04 LAB — CBC WITH DIFFERENTIAL (CANCER CENTER ONLY)
Abs Immature Granulocytes: 0.02 10*3/uL (ref 0.00–0.07)
Basophils Absolute: 0.1 10*3/uL (ref 0.0–0.1)
Basophils Relative: 2 %
Eosinophils Absolute: 0.3 10*3/uL (ref 0.0–0.5)
Eosinophils Relative: 5 %
HCT: 42.6 % (ref 36.0–46.0)
Hemoglobin: 14.5 g/dL (ref 12.0–15.0)
Immature Granulocytes: 0 %
Lymphocytes Relative: 28 %
Lymphs Abs: 1.7 10*3/uL (ref 0.7–4.0)
MCH: 31.9 pg (ref 26.0–34.0)
MCHC: 34 g/dL (ref 30.0–36.0)
MCV: 93.6 fL (ref 80.0–100.0)
Monocytes Absolute: 0.7 10*3/uL (ref 0.1–1.0)
Monocytes Relative: 13 %
Neutro Abs: 3.1 10*3/uL (ref 1.7–7.7)
Neutrophils Relative %: 52 %
Platelet Count: 298 10*3/uL (ref 150–400)
RBC: 4.55 MIL/uL (ref 3.87–5.11)
RDW: 12.2 % (ref 11.5–15.5)
WBC Count: 5.9 10*3/uL (ref 4.0–10.5)
nRBC: 0 % (ref 0.0–0.2)

## 2020-08-04 LAB — CMP (CANCER CENTER ONLY)
ALT: 16 U/L (ref 0–44)
AST: 16 U/L (ref 15–41)
Albumin: 3.5 g/dL (ref 3.5–5.0)
Alkaline Phosphatase: 77 U/L (ref 38–126)
Anion gap: 9 (ref 5–15)
BUN: 21 mg/dL — ABNORMAL HIGH (ref 6–20)
CO2: 24 mmol/L (ref 22–32)
Calcium: 9 mg/dL (ref 8.9–10.3)
Chloride: 108 mmol/L (ref 98–111)
Creatinine: 0.8 mg/dL (ref 0.44–1.00)
GFR, Estimated: >60 mL/min (ref >60)
Glucose, Bld: 143 mg/dL — ABNORMAL HIGH (ref 70–99)
Potassium: 4.2 mmol/L (ref 3.5–5.1)
Sodium: 141 mmol/L (ref 135–145)
Total Bilirubin: 0.4 mg/dL (ref 0.3–1.2)
Total Protein: 7.1 g/dL (ref 6.5–8.1)

## 2020-08-04 MED ORDER — AMOXICILLIN 500 MG PO CAPS: 500.0000 mg | ORAL_CAPSULE | Freq: Three times a day (TID) | ORAL | 0 refills | Status: DC

## 2020-08-04 NOTE — Assessment & Plan Note (Signed)
Right breast irregular mass lower inner quadrant retroareolar 02/19/2015: 2.2 x 1.3 x 1.7 cm with architectural distortion and skin/nipple retraction, T2 N0 stage II a clinical stage, additional benign cysts largest 1.2 cm Right breast biopsy 02/20/2015 5:00: Invasive ductal carcinoma grade 2, perineural invasion present, ER 90%, PR 70%, HER-2 negative ratio 0.95, Ki-67 15%   Breast MRI 03/07/2015: Subareolar mass 2.2 x 2.4 x 2.5 cm extending into the retracted nipple, extending inferiorly and laterally over a distance of 4 cm, several level I right axillary lymph nodes with cortical thickening up to 7 mm with a maximum diameter of 15 mm  05/16/2019:Caris molecular testing: ER positive, PI K3 CA mutation present, HER-2 negative, ER positive TMB low BRCA1 not detected, ESR 1 not detected, PD-L1 negative --------------------------------------------------------------------- Treatment summary:Anastrozole with Delton See started April 2020Ibrance added 09/29/2018 Bone scan: 09/20/2018: Progression of bone metastatic disease new metastases disease right lesser trochanter, mid left femoral diaphysis, additional bone mets involving left frontal, right iliac, right SI joint, right sixth rib, 12th rib, T12 transverse process and T10  Ibrancetoxicities:No adverse effectspreviously She resumedIbrance February 23, 2019  11/16/2018:Right mastectomy ([place])performed because the tumor was fungating: IDC, grade 1, 5.2cm, grossly involving underlying skin, clear margins.  Radiation to chest wall: 12/19/18- 01/30/19 Bone scan 06/26/2019: Stable distribution of bone metastases. CT CAP 04/28/2020: Mixed lytic and sclerotic bone metastases several demonstrating increased lytic character concern for worsening bone mets.    recheck CT scans in38month.  Xgeva labs and follow-up every 3 months

## 2020-08-04 NOTE — Telephone Encounter (Signed)
Scheduled appointment per 06/13 los. Patient is aware. 

## 2020-08-13 ENCOUNTER — Other Ambulatory Visit (HOSPITAL_COMMUNITY): Payer: Self-pay

## 2020-08-23 ENCOUNTER — Ambulatory Visit (INDEPENDENT_AMBULATORY_CARE_PROVIDER_SITE_OTHER): Payer: Medicare Other

## 2020-08-23 ENCOUNTER — Other Ambulatory Visit: Payer: Self-pay

## 2020-08-23 ENCOUNTER — Ambulatory Visit
Admission: EM | Admit: 2020-08-23 | Discharge: 2020-08-23 | Disposition: A | Discharge status: Home / Self Care | Payer: Medicare Other | Attending: Family Medicine | Admitting: Family Medicine

## 2020-08-23 DIAGNOSIS — R059 Cough, unspecified: Secondary | ICD-10-CM | POA: Diagnosis not present

## 2020-08-23 DIAGNOSIS — R0602 Shortness of breath: Secondary | ICD-10-CM | POA: Diagnosis not present

## 2020-08-23 DIAGNOSIS — R062 Wheezing: Secondary | ICD-10-CM

## 2020-08-23 MED ORDER — PREDNISONE 20 MG PO TABS: 40.0000 mg | ORAL_TABLET | Freq: Every day | ORAL | 0 refills | Status: DC

## 2020-08-23 NOTE — Discharge Instructions (Addendum)
Watch your blood sugars closely while on prednisone.

## 2020-08-23 NOTE — ED Triage Notes (Signed)
Pt present coughing with congestion that started four days ago, pt state last night she started experience difficulty breathing.

## 2020-08-25 NOTE — ED Provider Notes (Signed)
[facility]   [id number] 08/23/20 Arrival Time: 0820  ASSESSMENT & PLAN:  1. SOB (shortness of breath)   2. Wheezing    Discussed typical duration of likely viral illnesses. I have personally viewed the imaging studies ordered this visit. No signs of PNA. OTC symptom care as needed.  Begin trial of: Meds ordered this encounter  Medications   predniSONE (DELTASONE) 20 MG tablet    Sig: Take 2 tablets (40 mg total) by mouth daily.    Dispense:  10 tablet    Refill:  0     Follow-up Information     [name], MD.   Specialty: Family Medicine Why: If worsening or failing to improve as anticipated. Contact information: [address] [place] [address] [phone]                 Reviewed expectations re: course of current medical issues. Questions answered. Outlined signs and symptoms indicating need for more acute intervention. Understanding verbalized. After Visit Summary given.   SUBJECTIVE: History from: patient. TIFFANYE HARTMANN is a 55 y.o. female who reports cough and congestion; x 4 d. Reports feeling SOB at times past day or so. Ques wheezing. Denies: fever. Normal PO intake without n/v/d. Without CP.  Social History   Tobacco Use  Smoking Status Former   Packs/day: 3.00   Years: 30.00   Pack years: 90.00   Types: Cigarettes   Quit date: 10/24/2011   Years since quitting: 8.8  Smokeless Tobacco Never     OBJECTIVE:  Vitals:   08/23/20 0831  BP: 137/78  Pulse: 86  Resp: 16  Temp: 98.1 F (36.7 C)  TempSrc: Oral  SpO2: 96%    General appearance: alert; no distress Eyes: PERRLA; EOMI; conjunctiva normal HENT: [place]; AT; with nasal congestion Neck: supple  Lungs: speaks full sentences without difficulty; unlabored; bilateral exp wheezing Extremities: no edema Skin: warm and dry Neurologic: normal gait Psychological: alert and cooperative; normal mood and affect   Imaging: DG Chest 2  View  Result Date: 08/23/2020 CLINICAL DATA:  Short of breath, coughing EXAM: CHEST - 2 VIEW COMPARISON:  Prior chest x-ray 04/03/2020 FINDINGS: Stable cardiac and mediastinal contours. Interval resolution of multifocal peripheral patchy airspace opacities compared to the prior chest x-ray from February of 2022. Background bronchitic changes and mild interstitial prominence persist and are unchanged. No new focal airspace opacity, pleural effusion or pneumothorax. Persistent hyperinflation. No suspicious mass or nodule. No acute osseous abnormality. Small tubular densities in a geometric pattern across the upper abdomen consistent with artifact from the patient's clothing. IMPRESSION: No active cardiopulmonary disease. Hyperinflation, chronic bronchitic changes and interstitial prominence again noted and are suggestive of underlying COPD. Electronically Signed   By: Jacqulynn Cadet M.D.   On: 08/23/2020 09:09    Allergies  Allergen Reactions   Doxycycline Shortness Of Breath    Wheezing, shortness of breath, rash head to toe, and swelling   Naproxen Shortness Of Breath   Codeine Hives   Hydrocodone-Acetaminophen Hives   Ibuprofen     Irritant to stomach r/t diverticulitis   Lantus [Insulin Glargine]     Yeast Infections   Meloxicam Other (See Comments)    Abdominal pain    Propoxyphene N-Acetaminophen Hives   Sulfonamide Derivatives Hives   Tramadol Other (See Comments)    Abdominal Pain    Past Medical History:  Diagnosis Date   Anxiety state, unspecified    Arthritis    Breast  cancer of lower-inner quadrant of right female breast [facility])    Carpal tunnel syndrome    Complication of anesthesia    woke up during ganglion cyst removal in her 20's, not woken up since   Diabetes mellitus without complication (HCC)    Type II   Heart murmur    History of radiation therapy 12/18/18- 01/30/19   Right Chest wall 25 fractions X 2Gy each to total 50 Gy, followed by a boost 10 Gy in 5  fractions.    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, without mention of thyrotoxic crisis or storm    Social History   Socioeconomic History   Marital status: Divorced    Spouse name: Not on file   Number of children: Not on file   Years of education: Not on file   Highest education level: Not on file  Occupational History   Occupation: Scientist, water quality  Tobacco Use   Smoking status: Former    Packs/day: 3.00    Years: 30.00    Pack years: 90.00    Types: Cigarettes    Quit date: 10/24/2011    Years since quitting: 8.8   Smokeless tobacco: Never  Vaping Use   Vaping Use: Never used  Substance and Sexual Activity   Alcohol use: No   Drug use: Never    Comment: updated as of 09/06/2018 - pt reports no illegal drug use    Sexual activity: Not Currently  Other Topics Concern   Not on file  Social History Narrative   In [place] w/boyfriend   Works Chiropractor, Scientist, water quality   Social Determinants of Health   Financial Resource Strain: Not on file  Food Insecurity: Not on file  Transportation Needs: Not on file  Physical Activity: Not on file  Stress: Not on file  Social Connections: Not on file  Intimate Partner Violence: Not on file   Family History  Problem Relation Age of Onset   Diabetes Mother    Bladder Cancer Mother 66       smoker   Other Mother 59       TAH for unspecified reason   Multiple sclerosis Mother    Cancer Father 28       lymphatic/tonsil cancer - in remission; former smoker   COPD Maternal Grandmother        smoker   Emphysema Maternal Grandmother        smoker   Stroke Maternal Grandfather    Dementia Maternal Uncle    Diabetes Paternal Grandmother    COPD Maternal Uncle        smoker   Multiple sclerosis Paternal Aunt    Breast cancer Paternal Aunt        dx. late 68s - early 59s   Past Surgical History:  Procedure Laterality Date   ABDOMINAL  HYSTERECTOMY     APPENDECTOMY     CARPAL TUNNEL RELEASE     GANGLION CYST EXCISION Right    KNEE SURGERY Right 2007   ACL repair   TOTAL MASTECTOMY Right 11/16/2018   Procedure: RIGHT MASTECTOMY;  Surgeon: Coralie Keens, MD;  Location: [facility];  Service: General;  Laterality: Right;   TUBAL Ezequiel Essex, MD 08/25/20 [phone]

## 2020-08-26 ENCOUNTER — Other Ambulatory Visit: Payer: Self-pay

## 2020-08-26 NOTE — Patient Instructions (Signed)
Visit Information  Emily Wilson was given information about Medicaid Managed Care team care coordination services as a part of their Healthy [employer] Medicaid benefit. Emily Wilson verbally consented to engagement with the [facility] Managed Care team.   For questions related to your Healthy [facility] health plan, please call: [phone] or visit the homepage here: [email or web address]  If you would like to schedule transportation through your Healthy [facility] plan, please call the following number at least 2 days in advance of your appointment: [phone]  Call the [place] at [phone], at any time, 24 hours a day, 7 days a week. If you are in danger or need immediate medical attention call 911.  If you would like help to quit smoking, call 1-800-QUIT-NOW 720-419-3215) OR Espaol: 1-855-Djelo-Ya ([phone]) o para Emily informacin haga clic aqu or Text READY to 200-400 to register via text  Emily Wilson - following are the goals we discussed in your visit today:   Goals Addressed   None     Please see education materials related to DM provided as print materials.   Patient verbalizes understanding of instructions provided today.   The patient has been provided with contact information for the Managed Medicaid care management team and has been advised to call with any health related questions or concerns.   [place] Constable, Pharm.D., Managed Medicaid Pharmacist [phone]   Following is a copy of your plan of care:  Patient Care Plan: Diabetes Management     Problem Identified: Glycemic Management (Diabetes, Type 2)      Long-Range Goal: Glycemic Management Optimized   Start Date: 01/16/2020  Expected End Date: 09/30/2020  This Visit's Progress: On track  Recent Progress: On track  Priority: High  Note:    Current Barriers:  Chronic Disease Management support and education needs related to  Diabetes . Emily Wilson manages her diabetes by eating a healthy, plant based diet, checking blood sugars 3-4 times daily, attending routine screening appointments, and walking on days when the weather is nice. Emily Wilson was + Covid in February, feeling better, continues to have mild cough and runny nose. She is experiencing muscle weakness due to [place]. Plans to purchase a device to help open jars when she gets paid. Recent A1C 7.7, admits to liking Reece cups, otherwise adheres to a healthy diet. Denies any needs at this time.Update-Continues to have increased fatigue, unsure if this is related to radiation side effects. Plans to discuss with Oncology 08/04/20. She is getting exercise by working in her garden and enjoys eating the fresh vegetables. PCP recently increased insulin dose and she feels like her readings are improved. BS reading today 158. Nurse Case Manager Clinical Goal(s):  patient will meet with RN Care Manager to address barriers to managing medical care-Met-Emily Wilson reports not barriers at this time the patient will demonstrate ongoing self health care management ability as evidenced by improving A1C Interventions:  Inter-disciplinary care team collaboration (see longitudinal plan of care) Evaluation of current treatment plan related to Diabetes and patient's adherence to plan as established by provider. Discussed plans with patient for ongoing care management follow up and provided patient with direct contact information for care management team Reviewed scheduled/upcoming provider appointments Advised patient, providing education and rationale, to check cbg as directed by Dr. Juleen China and record, calling Dr. Juleen China for findings outside established parameters.   Provided information on Wilson rich foods Discussed increasing protein intake Recommended exercise 30 min/day for 5 days a week Patient  Goals/Self-Care Activities - Self administers medications as prescribed - Attends all  scheduled provider appointments - Calls pharmacy for medication refills - Calls provider office for new concerns or questions - check blood sugar at prescribed times - check blood sugar if I feel it is too high or too low - enter blood sugar readings and medication or insulin into daily log - take the blood sugar log to all doctor visits - take the blood sugar meter to all doctor visits -continue your diet of plant based food choices, limiting carbohydrates and sugars -exercise daily as tolerated -limit Reece cup intake, increase protein intake  Follow Up Plan: Telephone follow up appointment with Managed Medicaid care management team member scheduled for:09/30/20 @ 10:30am      Patient Care Plan: Medication Management     Problem Identified: Health Promotion or Disease Self-Management (General Plan of Care)      Goal: Medication Management   Note:   Current Barriers:  Does not contact provider office for questions/concerns   Pharmacist Clinical Goal(s):  Over the next 90 days, patient will contact provider office for questions/concerns as evidenced notation of same in electronic health record through collaboration with PharmD and provider.    Interventions: Inter-disciplinary care team collaboration (see longitudinal plan of care) Comprehensive medication review performed; medication list updated in electronic medical record  _0 @ _1 @ _2 @  Patient Goals/Self-Care Activities Over the next 90 days, patient will:  - collaborate with provider on medication access solutions  Follow Up Plan: The care management team will reach out to the patient again over the next 90 days.      Task: Mutually Develop and Royce Macadamia Achievement of Patient Goals   Note:   Care Management Activities:    - verbalization of feelings encouraged    Notes:

## 2020-08-26 NOTE — Patient Outreach (Signed)
Medicaid Managed Care    Pharmacy Note  08/26/2020 Name: Emily Wilson MRN: [id number] DOB: [birth year]  Emily Wilson is a 55 y.o. year old female who is a primary care patient of Emily China Bayard Beaver, MD. The [facility] Managed Care Coordination team was consulted for assistance with disease management and care coordination needs.    Engaged with patient Engaged with patient by telephone for initial visit in response to referral for case management and/or care coordination services.  Emily Wilson was given information about Managed Medicaid Care Coordination team services today. Emily Wilson agreed to services and verbal consent obtained.   Objective:  Lab Results  Component Value Date   CREATININE 0.80 08/04/2020   CREATININE 0.71 04/24/2020   CREATININE 0.83 02/01/2020    Lab Results  Component Value Date   HGBA1C 7.7 (H) 03/31/2020       Component Value Date/Time   CHOL 235 (H) 03/31/2020 1030   TRIG 154 (H) 03/31/2020 1030   HDL 34 (L) 03/31/2020 1030   CHOLHDL 6.9 (H) 03/31/2020 1030   CHOLHDL 6 11/04/2015 1045   VLDL 52.6 (H) 11/04/2015 1045   LDLCALC 172 (H) 03/31/2020 1030   LDLDIRECT 210.0 11/04/2015 1045    Other: (TSH, CBC, Vit D, etc.)  Clinical ASCVD: No  The 10-year ASCVD risk score Emily Bussing DC Jr., et al., 2013) is: 10.1%   Values used to calculate the score:     Age: 59 years     Sex: Female     Is Non-Hispanic African American: No     Diabetic: Yes     Tobacco smoker: No     Systolic Blood Pressure: 122 mmHg     Is BP treated: Yes     HDL Cholesterol: 34 mg/dL     Total Cholesterol: 235 mg/dL    Other: (CHADS2VASc if Afib, PHQ9 if depression, MMRC or CAT for COPD, ACT, DEXA)  BP Readings from Last 3 Encounters:  08/23/20 137/78  08/04/20 133/80  05/01/20 122/73    Assessment/Interventions: Review of patient past medical history, allergies, medications, health status, including review of consultants reports, laboratory and other  test data, was performed as part of comprehensive evaluation and provision of chronic care management services.   Pain Pain Scale: Patient didn't give pain scale because she said she only takes meds PRN and when she takes them she's content with the response Diclofenac 29m BID (Mainly takes in winter) Gabapentin 1062mtake 3TID (PRN) for trigger finger Plan: At goal,  patient stable/ symptoms controlled   HTN BP Readings from Last 3 Encounters:  08/23/20 137/78  08/04/20 133/80  05/01/20 122/73   -Born with heart condition per patient Metoprolol Succ 2535mD Plan: At goal,  patient stable/ symptoms controlled  Cholesterol Lab Results  Component Value Date   CHOL 235 (H) 03/31/2020   CHOL 125 04/10/2019   CHOL 297 (H) 03/16/2018   Lab Results  Component Value Date   HDL 34 (L) 03/31/2020   HDL 42 04/10/2019   HDL 43 03/16/2018   Lab Results  Component Value Date   LDLCALC 172 (H) 03/31/2020   LDLCALC 71 04/10/2019   LDLCALC 190 (H) 03/16/2018   Lab Results  Component Value Date   TRIG 154 (H) 03/31/2020   TRIG 52 04/10/2019   TRIG 318 (H) 03/16/2018   Lab Results  Component Value Date   CHOLHDL 6.9 (H) 03/31/2020   CHOLHDL 3.0 04/10/2019   CHOLHDL 6.9 (H) 03/16/2018   Lab  Results  Component Value Date   LDLDIRECT 210.0 11/04/2015   LDLDIRECT 139.0 06/17/2015   LDLDIRECT 186.0 02/11/2015   Rosuvastatin 3m Tried/Failed: Atorvastatin (Due to interaction with Palbociclib) April 2022 Plan: Recommend Rosuvastatin due to renal clearance May 2022: Patient called after appt with PCP, PCP stated she never got msg about statin. Double checked and PCP was CC'd on chart. Sent msg to PCP double checking  DM Lab Results  Component Value Date   HGBA1C 7.7 (H) 03/31/2020   HGBA1C 7.2 (A) 12/25/2019   HGBA1C 7.2 (H) 10/11/2019   Lab Results  Component Value Date   LDLCALC 172 (H) 03/31/2020   CREATININE 0.80 08/04/2020    Lab Results  Component Value Date    NA 141 08/04/2020   K 4.2 08/04/2020   CREATININE 0.80 08/04/2020   GFRNONAA >60 08/04/2020   GFRAA >60 11/02/2019   GLUCOSE 143 (H) 08/04/2020    Lab Results  Component Value Date   WBC 5.9 08/04/2020   HGB 14.5 08/04/2020   HCT 42.6 08/04/2020   MCV 93.6 08/04/2020   PLT 298 08/04/2020   NPH 12 units HS Insulin R 12 Units TID July 2022: Sugars are slightly elevated but patient is on Prednisone. The UC doc told her to give little extra if she goes high and this is working. I'd prefer her A1c to be below 7 but with all other health conditions going on, I'm content with <8 for now   [facility] Palbociclib Plan: At goal,  patient stable/ symptoms controlled   SDOH (Social Determinants of Health) assessments and interventions performed:    Care Plan  Allergies  Allergen Reactions   Doxycycline Shortness Of Breath    Wheezing, shortness of breath, rash head to toe, and swelling   Naproxen Shortness Of Breath   Codeine Hives   Hydrocodone-Acetaminophen Hives   Ibuprofen     Irritant to stomach r/t diverticulitis   Lantus [Insulin Glargine]     Yeast Infections   Meloxicam Other (See Comments)    Abdominal pain    Propoxyphene N-Acetaminophen Hives   Sulfonamide Derivatives Hives   Tramadol Other (See Comments)    Abdominal Pain    Medications Reviewed Today     Reviewed by MTomasita Morrow CMA (Certified Medical Assistant) on 08/23/20 at [phone] Med List Status: <None>   Medication Order Taking? Sig Documenting Provider Last Dose Status Informant  Accu-Chek FastClix Lancets MISC [id number] USE TO TEST BLOOD SUGAR UP TO FOUR TIMES DAILY AS DIRECTED WNicolette Bang MD  Active   amoxicillin (AMOXIL) 500 MG capsule [id number] Take 1 capsule (500 mg total) by mouth 3 (three) times daily. GNicholas Lose MD  Active   BD INSULIN SYRINGE U/F 31G X 5/16" 0.5 ML MISC [id number] USE TO ADMINISTER INSULIN 3 TIMES DAILY WITH MEALS WNicolette Bang MD   Active   diclofenac (VOLTAREN) 75 MG EC tablet [id number] Take 75 mg by mouth 2 (two) times daily as needed. [provider]  Active   gabapentin (NEURONTIN) 100 MG capsule [id number] Take 3 capsules (300 mg total) by mouth 3 (three) times daily.  Patient not taking: Reported on 07/23/2020   GNicholas Lose MD  Active            Med Note (ROBB, MELANIE A   Wed Jan 16, 2020 10:50 AM) Taking as needed  glucose blood (ACCU-CHEK GUIDE) test strip [id number] USE TO CHECK BLOOD SUGAR UP TO 4 TIMES  A DAY. DX: E11.65 [name], MD  Active   insulin NPH Human (HUMULIN N) 100 UNIT/ML injection [id number]  INJECT 12 UNITS INTO THE SKIN AT BEDTIME [name], MD  Active   insulin regular (HUMULIN R) 100 units/mL injection [id number]  Inject 0.12 mLs (12 Units total) into the skin 3 (three) times daily before meals. E.11.9. Hold dose for blood sugar less than 125 [name], MD  Active   letrozole [facility]) 2.5 MG tablet [id number]  TAKE 1 TABLET BY MOUTH EVERY DAY [name], MD  Active   metoprolol succinate (TOPROL-XL) 25 MG 24 hr tablet [id number]  TAKE 1 TABLET BY MOUTH EVERY DAY [name], MD  Active   Milk Thistle 1000 MG CAPS [id number]  Take 1,000 mg by mouth daily. [provider]  Active   Multiple Vitamin (MULTIVITAMIN WITH MINERALS) TABS tablet [id number]  Take 1 tablet by mouth daily. [provider]  Active Self  palbociclib (IBRANCE) 125 MG tablet [id number]  TAKE 1 TABLET (125 MG TOTAL) BY MOUTH DAILY. TAKE FOR 21 DAYS ON, 7 DAYS OFF, REPEAT EVERY 28 DAYS. [name], MD  Active   rosuvastatin (CRESTOR) 40 MG tablet [id number]  Take 1 tablet (40 mg total) by mouth daily. [name], MD  Active   Vitamin D, Ergocalciferol, (DRISDOL) 1.25 MG (50000 UNIT) CAPS capsule [id number]  Take 1 capsule by mouth once a week. [name], MD  Active   Med List Note [name], [facility] 10/02/18 1151): Ibrance  filled out [place]            Patient Active Problem List   Diagnosis Date Noted   History of shingles 01/09/2019   S/P mastectomy, right 11/16/2018   Bone metastasis ([place]) 05/31/2018   Family history of breast cancer in female 03/13/2015   Malignant neoplasm of lower-inner quadrant of right breast of female, estrogen receptor positive ([place]) 02/25/2015   DM (diabetes mellitus), type 2 ([place]) 02/25/2015   HLD (hyperlipidemia) 02/25/2015   Palpitations 01/26/2010   Anxiety state 05/26/2009   Hyperthyroidism 11/14/2006   Hypertension 11/14/2006    Conditions to be addressed/monitored: HTN and DM  Patient Care Plan: Diabetes Management     Problem Identified: Glycemic Management (Diabetes, Type 2)      Long-Range Goal: Glycemic Management Optimized   Start Date: 01/16/2020  Expected End Date: 07/23/2020  This Visit's Progress: On track  Recent Progress: On track  Priority: High  Note:    Current Barriers:  Chronic Disease Management support and education needs related to Diabetes . Emily Wilson manages her diabetes by eating a healthy, plant based diet, checking blood sugars 3-4 times daily, attending routine screening appointments, and walking on days when the weather is nice.-Update- Emily Wilson was + Covid in February, feeling better, continues to have mild cough and runny nose. She is experiencing muscle weakness due to [place]. Plans to purchase a device to help open jars when she gets paid. Recent A1C 7.7, admits to liking Reece cups, otherwise adheres to a healthy diet. Denies any needs at this time.  Nurse Case Manager Clinical Goal(s):  Over the next 60 days, patient will meet with RN Care Manager to address barriers to managing medical care-Met-Emily Wilson reports not barriers at this time Over the next 60 days, the patient will demonstrate ongoing self health care management ability as evidenced by improving A1C-Next PCP appointment  06/30/20  Interventions:  Inter-disciplinary care team collaboration (  see longitudinal plan of care) Evaluation of current treatment plan related to Diabetes and patient's adherence to plan as established by provider. Discussed plans with patient for ongoing care management follow up and provided patient with direct contact information for care management team Reviewed scheduled/upcoming provider appointments  Advised patient, providing education and rationale, to check cbg as directed by Dr. Juleen China and record, calling Dr. Juleen China for findings outside established parameters.   Provided information on Wilson rich foods Collaborate with MM pharmacist for medication management  Patient Goals/Self-Care Activities Over the next 60 days, patient will: - Self administers medications as prescribed - Attends all scheduled provider appointments - Calls pharmacy for medication refills - Calls provider office for new concerns or questions - check blood sugar at prescribed times - check blood sugar if I feel it is too high or too low - enter blood sugar readings and medication or insulin into daily log - take the blood sugar log to all doctor visits - take the blood sugar meter to all doctor visits -continue your diet of plant based food choices, limiting carbohydrates and sugars -exercise daily as tolerated  Follow Up Plan: Telephone follow up appointment with Managed Medicaid care management team member scheduled for:07/23/20 @ 10:30am      Patient Care Plan: Medication Management     Problem Identified: Health Promotion or Disease Self-Management (General Plan of Care)      Goal: Medication Management   Note:   Current Barriers:  Does not contact provider office for questions/concerns   Pharmacist Clinical Goal(s):  Over the next 90 days, patient will contact provider office for questions/concerns as evidenced notation of same in electronic health record through collaboration with PharmD  and provider.    Interventions: Inter-disciplinary care team collaboration (see longitudinal plan of care) Comprehensive medication review performed; medication list updated in electronic medical record  '@RXCPDIABETES' @ '@RXCPHYPERTENSION' @ '@RXCPHYPERLIPIDEMIA' @  Patient Goals/Self-Care Activities Over the next 90 days, patient will:  - collaborate with provider on medication access solutions  Follow Up Plan: The care management team will reach out to the patient again over the next 90 days.      Task: Mutually Develop and Royce Macadamia Achievement of Patient Goals   Note:   Care Management Activities:    - verbalization of feelings encouraged    Notes:      Medication Assistance: None required. Patient affirms current coverage meets needs.   Follow up: Agree   Plan: The care management team will reach out to the patient again over the next 90 days.   [place] Constable, Pharm.D., Managed Medicaid Pharmacist - [phone]

## 2020-08-27 ENCOUNTER — Other Ambulatory Visit (HOSPITAL_COMMUNITY): Payer: Self-pay

## 2020-09-02 ENCOUNTER — Other Ambulatory Visit (HOSPITAL_COMMUNITY): Payer: Self-pay

## 2020-09-22 ENCOUNTER — Telehealth: Payer: Self-pay | Admitting: Internal Medicine

## 2020-09-22 NOTE — Telephone Encounter (Signed)
I called Ms.Rubalcava to reschedule her 09/30/20 appt with the MM RNCM. I left my name and number on her VM.

## 2020-09-25 ENCOUNTER — Other Ambulatory Visit (HOSPITAL_COMMUNITY): Payer: Self-pay

## 2020-09-26 ENCOUNTER — Other Ambulatory Visit: Payer: Self-pay | Admitting: Hematology and Oncology

## 2020-09-26 ENCOUNTER — Other Ambulatory Visit (HOSPITAL_COMMUNITY): Payer: Self-pay

## 2020-09-26 ENCOUNTER — Encounter: Payer: Self-pay | Admitting: Hematology and Oncology

## 2020-09-26 DIAGNOSIS — C50311 Malignant neoplasm of lower-inner quadrant of right female breast: Secondary | ICD-10-CM

## 2020-09-26 DIAGNOSIS — Z170 Estrogen receptor positive status [ER+]: Secondary | ICD-10-CM

## 2020-09-26 MED ORDER — PALBOCICLIB 125 MG PO TABS
ORAL_TABLET | ORAL | 3 refills | Status: DC
  Filled 2020-09-26: qty 21, 28d supply, fill #0
  Filled 2020-10-23: qty 21, 28d supply, fill #1
  Filled 2020-11-26: qty 21, 28d supply, fill #2
  Filled 2020-12-17: qty 21, 28d supply, fill #3

## 2020-09-29 ENCOUNTER — Other Ambulatory Visit (HOSPITAL_COMMUNITY): Payer: Self-pay

## 2020-09-30 ENCOUNTER — Ambulatory Visit: Payer: Medicare Other

## 2020-10-09 ENCOUNTER — Ambulatory Visit: Payer: Medicare Other | Admitting: Hematology and Oncology

## 2020-10-09 ENCOUNTER — Ambulatory Visit: Payer: Medicare Other

## 2020-10-09 ENCOUNTER — Encounter: Payer: Self-pay | Admitting: Hematology and Oncology

## 2020-10-09 ENCOUNTER — Other Ambulatory Visit: Payer: Medicare Other

## 2020-10-13 ENCOUNTER — Other Ambulatory Visit: Payer: Self-pay | Admitting: Hematology and Oncology

## 2020-10-14 ENCOUNTER — Encounter: Payer: Self-pay | Admitting: Hematology and Oncology

## 2020-10-16 ENCOUNTER — Ambulatory Visit: Payer: Self-pay

## 2020-10-17 ENCOUNTER — Other Ambulatory Visit: Payer: Self-pay | Admitting: *Deleted

## 2020-10-17 ENCOUNTER — Other Ambulatory Visit: Payer: Self-pay

## 2020-10-17 NOTE — Patient Instructions (Signed)
Visit Information  Emily. Frayne was given information about Medicaid Managed Care team care coordination services as a part of their Healthy [employer] Medicaid benefit. [name] verbally consented to engagement with the [facility] Managed Care team.   If you are experiencing a medical emergency, please call 911 or report to your local emergency department or urgent care.   If you have a non-emergency medical problem during routine business hours, please contact your provider's office and ask to speak with a nurse.   For questions related to your Healthy [facility] health plan, please call: [phone] or visit the homepage here: [email or web address]  If you would like to schedule transportation through your Healthy [facility] plan, please call the following number at least 2 days in advance of your appointment: [phone]  Call the [place] at [phone], at any time, 24 hours a day, 7 days a week. If you are in danger or need immediate medical attention call 911.  If you would like help to quit smoking, call 1-800-QUIT-NOW (361)558-3601) OR Espaol: 1-855-Djelo-Ya (3-893-734-2876) o para Emily informacin haga clic aqu or Text READY to 200-400 to register via text  Emily Wilson - following are the goals we discussed in your visit today:   Goals Addressed             This Visit's Progress    Monitor and Manage My Blood Sugar       Follow up 11/19/20     -check blood sugars as PCP recommends  -participate in light exercise daily  -continue to eat a plant based, low carb diet, increase protein rich foods  -attend all scheduled appointments  -limit Reece cup intake, increase protein intake     Set My Target A1C       Follow up 11/19/20  - set target A1C -Patient and PCP would like for A1C to be 7.0 or less           Please see education materials related to sciatica pain provided as print materials.   The  patient verbalized understanding of instructions provided today and agreed to receive a mailed copy of patient instruction and/or educational materials.  Telephone follow up appointment with Managed Medicaid care management team member scheduled for:11/19/20 @ 3:30pm  Lurena Joiner RN, BSN [place] RN Care Coordinator   Following is a copy of your plan of care:  Patient Care Plan: Diabetes Management     Problem Identified: Glycemic Management (Diabetes, Type 2)      Long-Range Goal: Glycemic Management Optimized   Start Date: 01/16/2020  Expected End Date: 11/19/2020  Recent Progress: On track  Priority: High  Note:    Current Barriers:  Chronic Disease Management support and education needs related to Diabetes . Emily Wilson manages her diabetes by eating a healthy, plant based diet, checking blood sugars 3-4 times daily, attending routine screening appointments, and walking on days when the weather is nice. Emily Wilson was + Covid in February, feeling better, continues to have mild cough and runny nose. She is experiencing muscle weakness due to [place]. Plans to purchase a device to help open jars when she gets paid. Recent A1C 7.7, admits to liking Reece cups, otherwise adheres to a healthy diet. Denies any needs at this time. She is getting exercise by working in her garden and enjoys eating the fresh vegetables. PCP recently increased insulin dose and she feels like her readings are improved. -Update-Emily Wilson is experiencing sciatica  pain and is concerned her cancer has metastasized. She has an imaging study on 9/19 and plans to notify Dr. Lindi Adie for appropriate plan of care. She also expresses concern regarding her insurance change to [facility] and [place] [employer] in which she did not request. Nurse Case Manager Clinical Goal(s):  patient will meet with RN Care Manager to address barriers to managing medical care-Met-Emily Riggle reports not barriers at this  time the patient will demonstrate ongoing self health care management ability as evidenced by improving A1C Interventions:  Inter-disciplinary care team collaboration (see longitudinal plan of care) Evaluation of current treatment plan related to Diabetes and patient's adherence to plan as established by provider. Discussed plans with patient for ongoing care management follow up and provided patient with direct contact information for care management team Reviewed scheduled/upcoming provider appointments-CT on 9/19, PCP 9/20 and Dr. Lindi Adie 9/22 Advised patient, providing education and rationale, to check cbg as directed by Dr. Juleen China and record, calling Dr. Juleen China for findings outside established parameters.   Provided patient with contact information for Medicaid Enrollment Broker [phone] Advised patient to call her Medicaid Case Worker for clarification and discussion of insurance change Advised patient to use ice/heat therapy to lower back for sciatica pain RNCM will continue to follow patient until insurance issue is resolved Patient Goals/Self-Care Activities - Self administers medications as prescribed - Attends all scheduled provider appointments - Calls pharmacy for medication refills - Calls provider office for new concerns or questions - check blood sugar at prescribed times - check blood sugar if I feel it is too high or too low - enter blood sugar readings and medication or insulin into daily log - take the blood sugar log to all doctor visits - take the blood sugar meter to all doctor visits -continue your diet of plant based food choices, limiting carbohydrates and sugars -exercise daily as tolerated -limit Reece cup intake, increase protein intake  Follow Up Plan: Telephone follow up appointment with Managed Medicaid care management team member scheduled for:11/19/20 @ 3:30pm      Patient Care Plan: Medication Management     Problem Identified: Health  Promotion or Disease Self-Management (General Plan of Care)      Goal: Medication Management   Note:   Current Barriers:  Does not contact provider office for questions/concerns   Pharmacist Clinical Goal(s):  Over the next 90 days, patient will contact provider office for questions/concerns as evidenced notation of same in electronic health record through collaboration with PharmD and provider.    Interventions: Inter-disciplinary care team collaboration (see longitudinal plan of care) Comprehensive medication review performed; medication list updated in electronic medical record    Patient Goals/Self-Care Activities Over the next 90 days, patient will:  - collaborate with provider on medication access solutions  Follow Up Plan: The care management team will reach out to the patient again over the next 90 days.

## 2020-10-17 NOTE — Patient Outreach (Signed)
Medicaid Managed Care   Nurse Care Manager Note  10/17/2020 Name:  Emily Wilson MRN:  [id number] DOB:  05-17-1965  Emily Wilson is an 55 y.o. year old female who is a primary patient of Juleen China, Bayard Beaver, MD.  [facility] Managed Care Coordination team was consulted for assistance with:    DMII, metastatic breast cancer  Ms. Whitlatch was given information about Medicaid Managed Care Coordination team services today. Emily Wilson Patient agreed to services and verbal consent obtained.  Engaged with patient by telephone for follow up visit in response to provider referral for case management and/or care coordination services.   Assessments/Interventions:  Review of past medical history, allergies, medications, health status, including review of consultants reports, laboratory and other test data, was performed as part of comprehensive evaluation and provision of chronic care management services.  SDOH (Social Determinants of Health) assessments and interventions performed:   Care Plan  Allergies  Allergen Reactions   Doxycycline Shortness Of Breath    Wheezing, shortness of breath, rash head to toe, and swelling   Naproxen Shortness Of Breath   Codeine Hives   Hydrocodone-Acetaminophen Hives   Ibuprofen     Irritant to stomach r/t diverticulitis   Lantus [Insulin Glargine]     Yeast Infections   Meloxicam Other (See Comments)    Abdominal pain    Propoxyphene N-Acetaminophen Hives   Sulfonamide Derivatives Hives   Tramadol Other (See Comments)    Abdominal Pain    Medications Reviewed Today     Reviewed by [name], RN (Registered Nurse) on 10/17/20 at [place] List Status: <None>   Medication Order Taking? Sig Documenting Provider Last Dose Status Informant  Accu-Chek FastClix Lancets MISC [id number] No USE TO TEST BLOOD SUGAR UP TO FOUR TIMES DAILY AS DIRECTED [name], MD Taking Active   amoxicillin (AMOXIL) 500 MG capsule  [id number]  Take 1 capsule (500 mg total) by mouth 3 (three) times daily. [name], MD  Active   BD INSULIN SYRINGE U/F 31G X 5/16" 0.5 ML MISC [id number] No USE TO ADMINISTER INSULIN 3 TIMES DAILY WITH MEALS [name], MD Taking Active   diclofenac (VOLTAREN) 75 MG EC tablet [id number] No Take 75 mg by mouth 2 (two) times daily as needed. [provider] Taking Active   gabapentin (NEURONTIN) 100 MG capsule [id number] No Take 3 capsules (300 mg total) by mouth 3 (three) times daily.  Patient not taking: Reported on 07/23/2020   [name], MD Not Taking Active            Med Note (Donnamarie Shankles A   Wed Jan 16, 2020 10:50 AM) Taking as needed  glucose blood (ACCU-CHEK GUIDE) test strip [id number] No USE TO CHECK BLOOD SUGAR UP TO 4 TIMES A DAY. DX: E11.65 [name], MD Taking Active   insulin NPH Human (HUMULIN N) 100 UNIT/ML injection [id number] No INJECT 12 UNITS INTO THE SKIN AT BEDTIME [name], MD Taking Active   insulin regular (HUMULIN R) 100 units/mL injection [id number] No Inject 0.12 mLs (12 Units total) into the skin 3 (three) times daily before meals. E.11.9. Hold dose for blood sugar less than 125 [name], MD Taking Active   letrozole [facility]) 2.5 MG tablet [id number]  TAKE 1 TABLET BY MOUTH EVERY DAY [name], MD  Active   metoprolol succinate (TOPROL-XL) 25 MG 24 hr tablet [id number] No TAKE 1 TABLET BY MOUTH EVERY DAY Cristopher Peru  W, MD Taking Active   Milk Thistle 1000 MG CAPS [id number] No Take 1,000 mg by mouth daily. [provider] Taking Active   Multiple Vitamin (MULTIVITAMIN WITH MINERALS) TABS tablet [id number] No Take 1 tablet by mouth daily. [provider] Taking Active Self  palbociclib (IBRANCE) 125 MG tablet [id number]  TAKE 1 TABLET (125 MG TOTAL) BY MOUTH DAILY. TAKE FOR 21 DAYS ON, 7 DAYS OFF, REPEAT EVERY 28 DAYS. [name], MD  Active   predniSONE (DELTASONE) 20 MG  tablet [id number]  Take 2 tablets (40 mg total) by mouth daily. Vanessa Kick, MD  Active   rosuvastatin (CRESTOR) 40 MG tablet [id number] No Take 1 tablet (40 mg total) by mouth daily. [name], MD Taking Active   Vitamin D, Ergocalciferol, (DRISDOL) 1.25 MG (50000 UNIT) CAPS capsule [id number] No Take 1 capsule by mouth once a week. [name], MD Taking Active   Med List Note [name], [facility] 10/02/18 1151): Ibrance filled out [place]            Patient Active Problem List   Diagnosis Date Noted   History of shingles 01/09/2019   S/P mastectomy, right 11/16/2018   Bone metastasis ([place]) 05/31/2018   Family history of breast cancer in female 03/13/2015   Malignant neoplasm of lower-inner quadrant of right breast of female, estrogen receptor positive ([place]) 02/25/2015   DM (diabetes mellitus), type 2 ([place]) 02/25/2015   HLD (hyperlipidemia) 02/25/2015   Palpitations 01/26/2010   Anxiety state 05/26/2009   Hyperthyroidism 11/14/2006   Hypertension 11/14/2006    Conditions to be addressed/monitored per PCP order:  DMII and metastatic breast cancer  Care Plan : Diabetes Management  Updates made by [name], RN since 10/17/2020 12:00 AM     Problem: Glycemic Management (Diabetes, Type 2)      Long-Range Goal: Glycemic Management Optimized   Start Date: 01/16/2020  Expected End Date: 11/19/2020  Recent Progress: On track  Priority: High  Note:    Current Barriers:  Chronic Disease Management support and education needs related to Diabetes . Ms Belli manages her diabetes by eating a healthy, plant based diet, checking blood sugars 3-4 times daily, attending routine screening appointments, and walking on days when the weather is nice. Ms. Kist was + Covid in February, feeling better, continues to have mild cough and runny nose. She is experiencing muscle weakness due to [place]. Plans to purchase a device to help open  jars when she gets paid. Recent A1C 7.7, admits to liking Reece cups, otherwise adheres to a healthy diet. Denies any needs at this time. She is getting exercise by working in her garden and enjoys eating the fresh vegetables. PCP recently increased insulin dose and she feels like her readings are improved. -Update-Ms. Knighton is experiencing sciatica pain and is concerned her cancer has metastasized. She has an imaging study on 9/19 and plans to notify Dr. Lindi Adie for appropriate plan of care. She also expresses concern regarding her insurance change to [facility] and [place] [employer] in which she did not request. Nurse Case Manager Clinical Goal(s):  patient will meet with RN Care Manager to address barriers to managing medical care-Met-Ms Kohrs reports not barriers at this time the patient will demonstrate ongoing self health care management ability as evidenced by improving A1C Interventions:  Inter-disciplinary care team collaboration (see longitudinal plan of care) Evaluation of current treatment plan related to Diabetes and patient's adherence to plan as established by provider.  Discussed plans with patient for ongoing care management follow up and provided patient with direct contact information for care management team Reviewed scheduled/upcoming provider appointments-CT on 9/19, PCP 9/20 and Dr. Lindi Adie 9/22 Advised patient, providing education and rationale, to check cbg as directed by Dr. Juleen China and record, calling Dr. Juleen China for findings outside established parameters.   Provided patient with contact information for Medicaid Enrollment Broker [phone] Advised patient to call her Medicaid Case Worker for clarification and discussion of insurance change Advised patient to use ice/heat therapy to lower back for sciatica pain RNCM will continue to follow patient until insurance issue is resolved Patient Goals/Self-Care Activities - Self administers medications as prescribed - Attends  all scheduled provider appointments - Calls pharmacy for medication refills - Calls provider office for new concerns or questions - check blood sugar at prescribed times - check blood sugar if I feel it is too high or too low - enter blood sugar readings and medication or insulin into daily log - take the blood sugar log to all doctor visits - take the blood sugar meter to all doctor visits -continue your diet of plant based food choices, limiting carbohydrates and sugars -exercise daily as tolerated -limit Reece cup intake, increase protein intake  Follow Up Plan: Telephone follow up appointment with Managed Medicaid care management team member scheduled for:11/19/20 @ 3:30pm       Follow Up:  Patient agrees to Care Plan and Follow-up.  Plan: The Managed Medicaid care management team will reach out to the patient again over the next 30 days.  Date/time of next scheduled RN care management/care coordination outreach:  11/19/20 @ 3:30pm  Lurena Joiner RN, [place] RN Care Coordinator

## 2020-10-20 ENCOUNTER — Telehealth: Payer: Self-pay

## 2020-10-20 NOTE — Telephone Encounter (Signed)
Patient called with concerns with lower back pain/hip pain.    History of metastatic breast cancer.  Bone mets.   Patient with lower back pain and left hip pain, rating pain 6/10 with movement.  Patient unable to ambulate a lot due to pain.  Denies any new injuries, but has been using a Insurance account manager.  Full function of bladder and bowels.  Reports a history of sciatic, and patient feels like the pain is similar.    RN encouraged patient to continue use of OTC pain medication, such as Tylenol.  Will review with MD for additional recommendations.

## 2020-10-21 ENCOUNTER — Telehealth: Payer: Self-pay

## 2020-10-21 ENCOUNTER — Other Ambulatory Visit: Payer: Self-pay

## 2020-10-21 DIAGNOSIS — C50311 Malignant neoplasm of lower-inner quadrant of right female breast: Secondary | ICD-10-CM

## 2020-10-21 DIAGNOSIS — C7951 Secondary malignant neoplasm of bone: Secondary | ICD-10-CM

## 2020-10-21 NOTE — Telephone Encounter (Signed)
RN scheduled patient for bone scan on 9/19.  Due to patient being out of town, unable to get imaging moved up.  Pt aware.

## 2020-10-21 NOTE — Telephone Encounter (Signed)
Per MD recommendations bone scan to be obtained in addition to CT scan scheduled.  If able, move date to early September.    RN notified patient, patient verbalized understanding and agreement.  Patient will be out of town 9/8-9/18.  Pt saw orthopedic today and was given Rx for steroids.    Once bone scan is authorized, RN will contact scheduling to obtain date.  Pt notified of above.

## 2020-10-23 ENCOUNTER — Other Ambulatory Visit (HOSPITAL_COMMUNITY): Payer: Self-pay

## 2020-10-28 ENCOUNTER — Inpatient Hospital Stay: Payer: Medicare Other

## 2020-10-28 ENCOUNTER — Other Ambulatory Visit: Payer: Self-pay

## 2020-10-28 ENCOUNTER — Other Ambulatory Visit (HOSPITAL_COMMUNITY): Payer: Self-pay

## 2020-10-28 VITALS — BP 131/81 | HR 71 | Temp 98.5°F | Resp 16

## 2020-10-28 DIAGNOSIS — C7951 Secondary malignant neoplasm of bone: Secondary | ICD-10-CM | POA: Insufficient documentation

## 2020-10-28 DIAGNOSIS — Z9011 Acquired absence of right breast and nipple: Secondary | ICD-10-CM | POA: Insufficient documentation

## 2020-10-28 DIAGNOSIS — C50311 Malignant neoplasm of lower-inner quadrant of right female breast: Secondary | ICD-10-CM | POA: Insufficient documentation

## 2020-10-28 DIAGNOSIS — Z170 Estrogen receptor positive status [ER+]: Secondary | ICD-10-CM

## 2020-10-28 DIAGNOSIS — Z923 Personal history of irradiation: Secondary | ICD-10-CM | POA: Insufficient documentation

## 2020-10-28 DIAGNOSIS — Z510 Encounter for antineoplastic radiation therapy: Secondary | ICD-10-CM | POA: Diagnosis not present

## 2020-10-28 LAB — CBC WITH DIFFERENTIAL (CANCER CENTER ONLY)
Abs Immature Granulocytes: 0.02 10*3/uL (ref 0.00–0.07)
Basophils Absolute: 0.1 10*3/uL (ref 0.0–0.1)
Basophils Relative: 2 %
Eosinophils Absolute: 0.3 10*3/uL (ref 0.0–0.5)
Eosinophils Relative: 5 %
HCT: 40.9 % (ref 36.0–46.0)
Hemoglobin: 13.8 g/dL (ref 12.0–15.0)
Immature Granulocytes: 0 %
Lymphocytes Relative: 27 %
Lymphs Abs: 1.9 10*3/uL (ref 0.7–4.0)
MCH: 31.9 pg (ref 26.0–34.0)
MCHC: 33.7 g/dL (ref 30.0–36.0)
MCV: 94.5 fL (ref 80.0–100.0)
Monocytes Absolute: 0.9 10*3/uL (ref 0.1–1.0)
Monocytes Relative: 13 %
Neutro Abs: 3.6 10*3/uL (ref 1.7–7.7)
Neutrophils Relative %: 53 %
Platelet Count: 331 10*3/uL (ref 150–400)
RBC: 4.33 MIL/uL (ref 3.87–5.11)
RDW: 12.2 % (ref 11.5–15.5)
WBC Count: 6.8 10*3/uL (ref 4.0–10.5)
nRBC: 0 % (ref 0.0–0.2)

## 2020-10-28 LAB — CMP (CANCER CENTER ONLY)
ALT: 16 U/L (ref 0–44)
AST: 18 U/L (ref 15–41)
Albumin: 3.7 g/dL (ref 3.5–5.0)
Alkaline Phosphatase: 217 U/L — ABNORMAL HIGH (ref 38–126)
Anion gap: 12 (ref 5–15)
BUN: 18 mg/dL (ref 6–20)
CO2: 24 mmol/L (ref 22–32)
Calcium: 9.8 mg/dL (ref 8.9–10.3)
Chloride: 105 mmol/L (ref 98–111)
Creatinine: 0.77 mg/dL (ref 0.44–1.00)
GFR, Estimated: >60 mL/min (ref >60)
Glucose, Bld: 103 mg/dL — ABNORMAL HIGH (ref 70–99)
Potassium: 4.3 mmol/L (ref 3.5–5.1)
Sodium: 141 mmol/L (ref 135–145)
Total Bilirubin: 0.4 mg/dL (ref 0.3–1.2)
Total Protein: 7.3 g/dL (ref 6.5–8.1)

## 2020-10-28 MED ORDER — DENOSUMAB 120 MG/1.7ML ~~LOC~~ SOLN
120.0000 mg | Freq: Once | SUBCUTANEOUS | Status: AC
  Administered 2020-10-28: 120 mg via SUBCUTANEOUS
  Filled 2020-10-28: qty 1.7

## 2020-10-28 NOTE — Patient Instructions (Signed)
Denosumab injection What is this medication? DENOSUMAB (den oh sue mab) slows bone breakdown. Prolia is used to treat osteoporosis in women after menopause and in men, and in people who are taking corticosteroids for 6 months or more. Xgeva is used to treat a high calcium level due to cancer and to prevent bone fractures and other bone problems caused by multiple myeloma or cancer bone metastases. Xgeva is also used to treat giant cell tumor of the bone. This medicine may be used for other purposes; ask your health care provider or pharmacist if you have questions. COMMON BRAND NAME(S): Prolia, XGEVA What should I tell my care team before I take this medication? They need to know if you have any of these conditions: dental disease having surgery or tooth extraction infection kidney disease low levels of calcium or Vitamin D in the blood malnutrition on hemodialysis skin conditions or sensitivity thyroid or parathyroid disease an unusual reaction to denosumab, other medicines, foods, dyes, or preservatives pregnant or trying to get pregnant breast-feeding How should I use this medication? This medicine is for injection under the skin. It is given by a health care professional in a hospital or clinic setting. A special MedGuide will be given to you before each treatment. Be sure to read this information carefully each time. For Prolia, talk to your pediatrician regarding the use of this medicine in children. Special care may be needed. For Xgeva, talk to your pediatrician regarding the use of this medicine in children. While this drug may be prescribed for children as young as 13 years for selected conditions, precautions do apply. Overdosage: If you think you have taken too much of this medicine contact a poison control center or emergency room at once. NOTE: This medicine is only for you. Do not share this medicine with others. What if I miss a dose? It is important not to miss your dose.  Call your doctor or health care professional if you are unable to keep an appointment. What may interact with this medication? Do not take this medicine with any of the following medications: other medicines containing denosumab This medicine may also interact with the following medications: medicines that lower your chance of fighting infection steroid medicines like prednisone or cortisone This list may not describe all possible interactions. Give your health care provider a list of all the medicines, herbs, non-prescription drugs, or dietary supplements you use. Also tell them if you smoke, drink alcohol, or use illegal drugs. Some items may interact with your medicine. What should I watch for while using this medication? Visit your doctor or health care professional for regular checks on your progress. Your doctor or health care professional may order blood tests and other tests to see how you are doing. Call your doctor or health care professional for advice if you get a fever, chills or sore throat, or other symptoms of a cold or flu. Do not treat yourself. This drug may decrease your body's ability to fight infection. Try to avoid being around people who are sick. You should make sure you get enough calcium and vitamin D while you are taking this medicine, unless your doctor tells you not to. Discuss the foods you eat and the vitamins you take with your health care professional. See your dentist regularly. Brush and floss your teeth as directed. Before you have any dental work done, tell your dentist you are receiving this medicine. Do not become pregnant while taking this medicine or for 5 months after   stopping it. Talk with your doctor or health care professional about your birth control options while taking this medicine. Women should inform their doctor if they wish to become pregnant or think they might be pregnant. There is a potential for serious side effects to an unborn child. Talk to  your health care professional or pharmacist for more information. What side effects may I notice from receiving this medication? Side effects that you should report to your doctor or health care professional as soon as possible: allergic reactions like skin rash, itching or hives, swelling of the face, lips, or tongue bone pain breathing problems dizziness jaw pain, especially after dental work redness, blistering, peeling of the skin signs and symptoms of infection like fever or chills; cough; sore throat; pain or trouble passing urine signs of low calcium like fast heartbeat, muscle cramps or muscle pain; pain, tingling, numbness in the hands or feet; seizures unusual bleeding or bruising unusually weak or tired Side effects that usually do not require medical attention (report to your doctor or health care professional if they continue or are bothersome): constipation diarrhea headache joint pain loss of appetite muscle pain runny nose tiredness upset stomach This list may not describe all possible side effects. Call your doctor for medical advice about side effects. You may report side effects to FDA at 1-800-FDA-1088. Where should I keep my medication? This medicine is only given in a clinic, doctor's office, or other health care setting and will not be stored at home. NOTE: This sheet is a summary. It may not cover all possible information. If you have questions about this medicine, talk to your doctor, pharmacist, or health care provider.  2022 Elsevier/Gold Standard (2017-06-17 16:10:44)

## 2020-10-29 ENCOUNTER — Other Ambulatory Visit: Payer: Self-pay | Admitting: Hematology and Oncology

## 2020-10-30 ENCOUNTER — Encounter: Payer: Self-pay | Admitting: Hematology and Oncology

## 2020-11-06 ENCOUNTER — Ambulatory Visit: Payer: Medicare Other | Admitting: Hematology and Oncology

## 2020-11-06 ENCOUNTER — Ambulatory Visit: Payer: Medicare Other

## 2020-11-06 ENCOUNTER — Other Ambulatory Visit: Payer: Medicare Other

## 2020-11-10 ENCOUNTER — Encounter (HOSPITAL_COMMUNITY)
Admission: RE | Admit: 2020-11-10 | Discharge: 2020-11-10 | Disposition: A | Discharge status: Home / Self Care | Payer: Medicare Other | Source: Ambulatory Visit | Attending: Hematology and Oncology | Admitting: Hematology and Oncology

## 2020-11-10 ENCOUNTER — Ambulatory Visit (HOSPITAL_COMMUNITY)
Admission: RE | Admit: 2020-11-10 | Discharge: 2020-11-10 | Disposition: A | Discharge status: Home / Self Care | Payer: Medicare Other | Source: Ambulatory Visit | Attending: Hematology and Oncology | Admitting: Hematology and Oncology

## 2020-11-10 ENCOUNTER — Other Ambulatory Visit: Payer: Self-pay

## 2020-11-10 DIAGNOSIS — C7951 Secondary malignant neoplasm of bone: Secondary | ICD-10-CM | POA: Insufficient documentation

## 2020-11-10 DIAGNOSIS — C50311 Malignant neoplasm of lower-inner quadrant of right female breast: Secondary | ICD-10-CM | POA: Insufficient documentation

## 2020-11-10 DIAGNOSIS — Z170 Estrogen receptor positive status [ER+]: Secondary | ICD-10-CM

## 2020-11-10 MED ORDER — TECHNETIUM TC 99M MEDRONATE IV KIT
21.1000 | PACK | Freq: Once | INTRAVENOUS | Status: AC
  Administered 2020-11-10: 21.1 via INTRAVENOUS

## 2020-11-11 ENCOUNTER — Ambulatory Visit (INDEPENDENT_AMBULATORY_CARE_PROVIDER_SITE_OTHER): Payer: Medicare Other | Admitting: Family Medicine

## 2020-11-11 VITALS — BP 122/81 | HR 82 | Temp 98.1°F | Resp 16

## 2020-11-11 DIAGNOSIS — E1159 Type 2 diabetes mellitus with other circulatory complications: Secondary | ICD-10-CM | POA: Diagnosis not present

## 2020-11-11 DIAGNOSIS — Z7689 Persons encountering health services in other specified circumstances: Secondary | ICD-10-CM

## 2020-11-11 DIAGNOSIS — E785 Hyperlipidemia, unspecified: Secondary | ICD-10-CM | POA: Diagnosis not present

## 2020-11-11 DIAGNOSIS — I10 Essential (primary) hypertension: Secondary | ICD-10-CM

## 2020-11-11 DIAGNOSIS — Z794 Long term (current) use of insulin: Secondary | ICD-10-CM | POA: Diagnosis not present

## 2020-11-11 DIAGNOSIS — E039 Hypothyroidism, unspecified: Secondary | ICD-10-CM

## 2020-11-11 DIAGNOSIS — C50919 Malignant neoplasm of unspecified site of unspecified female breast: Secondary | ICD-10-CM

## 2020-11-11 LAB — POCT GLYCOSYLATED HEMOGLOBIN (HGB A1C): Hemoglobin A1C: 6.6 % — AB (ref 4.0–5.6)

## 2020-11-11 MED ORDER — INSULIN REGULAR HUMAN 100 UNIT/ML IJ SOLN: INTRAMUSCULAR | 11 refills | Status: DC

## 2020-11-11 NOTE — Progress Notes (Signed)
6.6Patient is here to establish care with provider.  Patient is here for her DM follow-up

## 2020-11-12 ENCOUNTER — Encounter: Payer: Self-pay | Admitting: Family Medicine

## 2020-11-12 LAB — LIPID PANEL
Chol/HDL Ratio: 6.3 ratio — ABNORMAL HIGH (ref 0.0–4.4)
Cholesterol, Total: 307 mg/dL — ABNORMAL HIGH (ref 100–199)
HDL: 49 mg/dL (ref >39)
LDL Chol Calc (NIH): 200 mg/dL — ABNORMAL HIGH (ref 0–99)
Triglycerides: 291 mg/dL — ABNORMAL HIGH (ref 0–149)
VLDL Cholesterol Cal: 58 mg/dL — ABNORMAL HIGH (ref 5–40)

## 2020-11-12 LAB — T4, FREE: Free T4: 1.03 ng/dL (ref 0.82–1.77)

## 2020-11-12 LAB — TSH: TSH: 1.05 u[IU]/mL (ref 0.450–4.500)

## 2020-11-12 NOTE — Progress Notes (Signed)
Patient Care Team: Dorna Mai, MD as PCP - General (Family Medicine) [name], RN as Case Manager Lane Hacker, [facility] as Pharmacist (Pharmacist)  DIAGNOSIS:    ICD-10-CM   1. Malignant neoplasm of lower-inner quadrant of right breast of female, estrogen receptor positive ([place])  C50.311    Z17.0       SUMMARY OF ONCOLOGIC HISTORY: Oncology History  Malignant neoplasm of lower-inner quadrant of right breast of female, estrogen receptor positive ([place])  02/19/2015 Mammogram   Right breast irregular mass lower inner quadrant 2.2 x 1.3 x 1.7 cm with architectural distortion and skin/nipple retraction, T2 N0 stage II a clinical stage, additional benign cysts largest 1.2 cm   02/20/2015 Initial Diagnosis   Right breast biopsy 5:00: Invasive ductal carcinoma grade 2, perineural invasion present, ER 90%, PR 70%, HER-2 negative ratio 0.95, Ki-67 15%    03/07/2015 Breast MRI   right breast inferior subareolar mass measuring 2.2 x 2.4 x 2.5 cm with skin and nipple enhancement, extending inferiorly and laterally is intraductal enhancement over a distance of 4 cm, several level I right axillary lymph nodes with cortical thickening   03/13/2015 -  Anti-estrogen oral therapy   Neoadjuvant antiestrogen therapy with tamoxifen 20 mg daily (because the patient did not want to undergo surgery immediately for multiple family reasons) stopped in late 2017; anastrozole started 05/11/18   05/23/2018 Imaging   CT CAP: 2.9 cm spiculated soft tissue density central right breast, no evidence of soft tissue metastatic disease within the chest abdomen pelvis.  Subcentimeter sclerotic bone lesions T10, left pubis, right posterior ilium Bone scan: Foci of uptake left frontal calvarium, T-spine, right ilium   10/04/2018 -  Anti-estrogen oral therapy   Ibrance with anastrozole   11/16/2018 Surgery   Right mastectomy Ninfa Linden): IDC, grade 1, 5.2cm, grossly involving underlying skin, clear margins.    12/07/2018 Cancer Staging   Staging form: Breast, AJCC 7th Edition - Pathologic stage from 12/07/2018: Stage IV (yT4b, NX, M1) - Signed by Eppie Gibson, MD on 12/07/2018   12/19/2018 - 01/30/2019 Radiation Therapy   Adjuvant radiation   05/16/2019 Miscellaneous   Caris molecular testing: ER positive, PI K3 CA mutation present, HER-2 negative, ER positive TMB low BRCA1 not detected, ESR 1 not detected, PD-L1 negative     CHIEF COMPLIANT:  Follow-up of metastatic breast on Ibrance and anastrozole  INTERVAL HISTORY: Emily Wilson is a 55 y.o. with above-mentioned history of metastatic breast cancer currently on anastrozole and Ibrance. CT CAP 11/10/2020 showed interval progression of lytic bone metastases involving the bony thorax, thoracolumbar spine, and bilateral iliac bones, progressive lytic changes involving the T10 vertebral body with extension into the right posterior elements, and new 3 mm subpleural nodule within the anterior left upper lobe. She reports to the clinic today for follow-up.  She continues to have a hard time walking.  Her right leg is now weak.  ALLERGIES:  is allergic to doxycycline, naproxen, codeine, hydrocodone-acetaminophen, ibuprofen, lantus [insulin glargine], meloxicam, propoxyphene n-acetaminophen, sulfonamide derivatives, and tramadol.  MEDICATIONS:  Current Outpatient Medications  Medication Sig Dispense Refill   Accu-Chek FastClix Lancets MISC USE TO TEST BLOOD SUGAR UP TO FOUR TIMES DAILY AS DIRECTED 204 each 2   amoxicillin (AMOXIL) 500 MG capsule Take 1 capsule (500 mg total) by mouth 3 (three) times daily. 14 capsule 0   BD INSULIN SYRINGE U/F 31G X 5/16" 0.5 ML MISC USE TO ADMINISTER INSULIN 3 TIMES DAILY WITH MEALS 300 each 2  diclofenac (VOLTAREN) 75 MG EC tablet Take 75 mg by mouth 2 (two) times daily as needed.     gabapentin (NEURONTIN) 100 MG capsule Take 3 capsules (300 mg total) by mouth 3 (three) times daily. 90 capsule 1   glucose  blood (ACCU-CHEK GUIDE) test strip USE TO CHECK BLOOD SUGAR UP TO 4 TIMES A DAY. DX: E11.65 200 strip 2   insulin NPH Human (HUMULIN N) 100 UNIT/ML injection INJECT 12 UNITS INTO THE SKIN AT BEDTIME 10 mL 1   insulin regular (NOVOLIN R) 100 units/mL injection Inject 0.12 mLs (12 Units total) into the skin 3 (three) times daily before meals. E.11.9. Hold dose for blood sugar less than 125 10 mL 11   letrozole (FEMARA) 2.5 MG tablet TAKE 1 TABLET BY MOUTH EVERY DAY 90 tablet 3   metoprolol succinate (TOPROL-XL) 25 MG 24 hr tablet TAKE 1 TABLET BY MOUTH EVERY DAY 90 tablet 3   Milk Thistle 1000 MG CAPS Take 1,000 mg by mouth daily.     Multiple Vitamin (MULTIVITAMIN WITH MINERALS) TABS tablet Take 1 tablet by mouth daily.     palbociclib (IBRANCE) 125 MG tablet TAKE 1 TABLET (125 MG TOTAL) BY MOUTH DAILY. TAKE FOR 21 DAYS ON, 7 DAYS OFF, REPEAT EVERY 28 DAYS. 21 tablet 3   predniSONE (DELTASONE) 20 MG tablet Take 2 tablets (40 mg total) by mouth daily. 10 tablet 0   rosuvastatin (CRESTOR) 40 MG tablet Take 1 tablet (40 mg total) by mouth daily. 90 tablet 3   Vitamin D, Ergocalciferol, (DRISDOL) 1.25 MG (50000 UNIT) CAPS capsule Take 1 capsule by mouth once a week.     No current facility-administered medications for this visit.    PHYSICAL EXAMINATION: ECOG PERFORMANCE STATUS: 1 - Symptomatic but completely ambulatory  Vitals:   11/13/20 1108  BP: 116/62  Pulse: 84  Resp: 18  Temp: 97.8 F (36.6 C)  SpO2: 98%   Filed Weights   11/13/20 1108  Weight: 152 lb (68.9 kg)     LABORATORY DATA:  I have reviewed the data as listed CMP Latest Ref Rng & Units 10/28/2020 08/04/2020 04/24/2020  Glucose 70 - 99 mg/dL 103(H) 143(H) 143(H)  BUN 6 - 20 mg/dL 18 21(H) 18  Creatinine 0.44 - 1.00 mg/dL 0.77 0.80 0.71  Sodium 135 - 145 mmol/L 141 141 138  Potassium 3.5 - 5.1 mmol/L 4.3 4.2 3.9  Chloride 98 - 111 mmol/L 105 108 108  CO2 22 - 32 mmol/L _0 Calcium 8.9 - 10.3 mg/dL 9.8 9.0 9.2   Total Protein 6.5 - 8.1 g/dL 7.3 7.1 7.0  Total Bilirubin 0.3 - 1.2 mg/dL 0.4 0.4 0.4  Alkaline Phos 38 - 126 U/L 217(H) 77 76  AST 15 - 41 U/L 18 16 12(L)  ALT 0 - 44 U/L _1 Lab Results  Component Value Date   WBC 6.8 10/28/2020   HGB 13.8 10/28/2020   HCT 40.9 10/28/2020   MCV 94.5 10/28/2020   PLT 331 10/28/2020   NEUTROABS 3.6 10/28/2020    ASSESSMENT & PLAN:  Malignant neoplasm of lower-inner quadrant of right breast of female, estrogen receptor positive ([place]) Right breast irregular mass lower inner quadrant retroareolar 02/19/2015: 2.2 x 1.3 x 1.7 cm with architectural distortion and skin/nipple retraction, T2 N0 stage II a clinical stage, additional benign cysts largest 1.2 cm Right breast biopsy 02/20/2015 5:00: Invasive ductal carcinoma grade 2, perineural invasion present, ER 90%, PR 70%, HER-2  negative ratio 0.95, Ki-67 15%     Breast MRI 03/07/2015: Subareolar mass 2.2 x 2.4 x 2.5 cm extending into the retracted nipple, extending inferiorly and laterally over a distance of 4 cm, several level I right axillary lymph nodes with cortical thickening up to 7 mm with a maximum diameter of 15 mm   05/16/2019:Caris molecular testing: ER positive, PI K3 CA mutation present, HER-2 negative, ER positive TMB low BRCA1 not detected, ESR 1 not detected, PD-L1 negative --------------------------------------------------------------------- Treatment summary: Anastrozole with Delton See started April 2020 Ibrance added 09/29/2018 Bone scan: 09/20/2018: Progression of bone metastatic disease new metastases disease right lesser trochanter, mid left femoral diaphysis, additional bone mets involving left frontal, right iliac, right SI joint, right sixth rib, 12th rib, T12 transverse process and T10   Ibrance toxicities: No adverse effects previously She resumed Ibrance February 23, 2019   11/16/2018:Right mastectomy Ninfa Linden) performed because the tumor was fungating: IDC, grade 1, 5.2cm,  grossly involving underlying skin, clear margins.   Radiation to chest wall: 12/19/18- 01/30/19 Bone scan 06/26/2019: Stable distribution of bone metastases. CT CAP 04/28/2020: Mixed lytic and sclerotic bone metastases several demonstrating increased lytic character concern for worsening bone mets. CT CAP 11/11/2020: Interval progression of lytic bone metastases in the thorax, thoracolumbar spine, bilateral iliac bones.  Progressive lytic changes T10 with extension into the right posterior elements with possible involvement of right neural foramina and central canal. Bone scan 11/11/2020: Diffuse bone metastases progressed from prior new lesion in the left mid femur  Plan: 1.  MRI of thoracic spine to evaluate for cord compression 2. x-ray of the left femur for any risk of pathologic fracture 3.  Consult radiation oncology for palliative radiation 4.  Add Faslodex to Ibrance and letrozole   recheck CT scans in 3 months.  Xgeva labs and follow-up every 3 months    No orders of the defined types were placed in this encounter.  The patient has a good understanding of the overall plan. she agrees with it. she will call with any problems that may develop before the next visit here.  Total time spent: 45 mins including face to face time and time spent for planning, charting and coordination of care  Rulon Eisenmenger, MD, MPH 11/13/2020  I, Thana Ates, am acting as scribe for Dr. [name].  I have reviewed the above documentation for accuracy and completeness, and I agree with the above.

## 2020-11-12 NOTE — Progress Notes (Signed)
New Patient Office Visit  Subjective:  Patient ID: Emily Wilson, female    DOB: 1965/05/21  Age: 55 y.o. MRN: [id number]  CC:  Chief Complaint  Patient presents with  . Establish Care  . Diabetes    HPI Emily Wilson presents for to establish care. Patient also wants to review chronic med issues.  Past Medical History:  Diagnosis Date  . Anxiety state, unspecified   . Arthritis   . Breast cancer of lower-inner quadrant of right female breast ([place])   . Carpal tunnel syndrome   . Complication of anesthesia    woke up during ganglion cyst removal in her 20's, not woken up since  . Diabetes mellitus without complication ([place])    Type II  . Heart murmur   . History of radiation therapy 12/18/18- 01/30/19   Right Chest wall 25 fractions X 2Gy each to total 50 Gy, followed by a boost 10 Gy in 5 fractions.   . Hyperlipidemia   . Hyperthyroidism   . Mitral valve prolapse   . Smoker   . Tachycardia   . 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 ABDOMINAL HYSTERECTOMY    . APPENDECTOMY    . CARPAL TUNNEL RELEASE    . GANGLION CYST EXCISION Right   . KNEE SURGERY Right 2007   ACL repair  . TOTAL MASTECTOMY Right 11/16/2018   Procedure: RIGHT MASTECTOMY;  Surgeon: Coralie Keens, MD;  Location: [place];  Service: General;  Laterality: Right;  . TUBAL LIGATION      Family History  Problem Relation Age of Onset  . Diabetes Mother   . Bladder Cancer Mother 59       smoker  . Other Mother 18       TAH for unspecified reason  . Multiple sclerosis Mother   . Cancer Father 48       lymphatic/tonsil cancer - in remission; former smoker  . COPD Maternal Grandmother        smoker  . Emphysema Maternal Grandmother        smoker  . Stroke Maternal Grandfather   . Dementia Maternal Uncle   . Diabetes Paternal Grandmother   . COPD Maternal Uncle         smoker  . Multiple sclerosis Paternal Aunt   . Breast cancer Paternal Aunt        dx. late 20s - early 37s    Social History   Socioeconomic History  . Marital status: Divorced    Spouse name: Not on file  . Number of children: Not on file  . Years of education: Not on file  . Highest education level: Not on file  Occupational History  . Occupation: Scientist, water quality  Tobacco Use  . Smoking status: Former    Packs/day: 3.00    Years: 30.00    Pack years: 90.00    Types: Cigarettes    Quit date: 10/24/2011    Years since quitting: 9.0  . Smokeless tobacco: Never  Vaping Use  . Vaping Use: Never used  Substance and Sexual Activity  . Alcohol use: No  . Drug use: Never    Comment: updated as of 09/06/2018 - pt reports no illegal drug use   . Sexual activity: Not Currently  Other Topics Concern  . Not on file  Social History Narrative   In LTR w/boyfriend  Works Chiropractor, TEFL teacher of Radio broadcast assistant Strain: Not on [employer] Insecurity: Not on file  Transportation Needs: Not on file  Physical Activity: Not on file  Stress: Not on file  Social Connections: Not on file  Intimate Partner Violence: Not on file    ROS Review of Systems  Musculoskeletal:  Positive for back pain.  All other systems reviewed and are negative.  Objective:   Today's Vitals: There were no vitals taken for this visit.  Physical Exam Vitals and nursing note reviewed.  Constitutional:      General: She is not in acute distress. Neck:     Thyroid: No thyromegaly.  Cardiovascular:     Rate and Rhythm: Normal rate and regular rhythm.  Pulmonary:     Effort: Pulmonary effort is normal.     Breath sounds: Normal breath sounds.  Abdominal:     Palpations: Abdomen is soft.     Tenderness: There is no abdominal tenderness.  Musculoskeletal:     Cervical back: Normal range of motion and neck supple.     Right lower leg: No edema.     Left lower leg: No edema.   Neurological:     General: No focal deficit present.     Mental Status: She is alert and oriented to person, place, and time.    Assessment & Plan:   1. Type 2 diabetes mellitus with other circulatory complication, with long-term current use of insulin ([place]) Monitoring labs ordered - continue - discussed dietary and activity options.  - POCT glycosylated hemoglobin (Hb A1C) - Microalbumin / creatinine urine ratio - insulin regular (NOVOLIN R) 100 units/mL injection; Inject 0.12 mLs (12 Units total) into the skin 3 (three) times daily before meals. E.11.9. Hold dose for blood sugar less than 125  Dispense: 10 mL; Refill: 11  2. Essential hypertension Appears stable - continue  3. Hypothyroidism, unspecified type Monitoring labs ordered - continue - T4, Free - TSH  4. Hyperlipidemia, unspecified hyperlipidemia type Monitoring labs ordered - continue - Lipid Panel  5. Metastatic breast cancer [facility]) Management as per consultant  6. Encounter to establish care     Outpatient Encounter Medications as of 11/11/2020  Medication Sig  . Accu-Chek FastClix Lancets MISC USE TO TEST BLOOD SUGAR UP TO FOUR TIMES DAILY AS DIRECTED  . amoxicillin (AMOXIL) 500 MG capsule Take 1 capsule (500 mg total) by mouth 3 (three) times daily.  . BD INSULIN SYRINGE U/F 31G X 5/16" 0.5 ML MISC USE TO ADMINISTER INSULIN 3 TIMES DAILY WITH MEALS  . diclofenac (VOLTAREN) 75 MG EC tablet Take 75 mg by mouth 2 (two) times daily as needed.  . gabapentin (NEURONTIN) 100 MG capsule Take 3 capsules (300 mg total) by mouth 3 (three) times daily.  Marland Kitchen glucose blood (ACCU-CHEK GUIDE) test strip USE TO CHECK BLOOD SUGAR UP TO 4 TIMES A DAY. DX: E11.65  . insulin NPH Human (HUMULIN N) 100 UNIT/ML injection INJECT 12 UNITS INTO THE SKIN AT BEDTIME  . insulin regular (NOVOLIN R) 100 units/mL injection Inject 0.12 mLs (12 Units total) into the skin 3 (three) times daily before meals. E.11.9. Hold dose for blood sugar less  than 125  . letrozole (FEMARA) 2.5 MG tablet TAKE 1 TABLET BY MOUTH EVERY DAY  . metoprolol succinate (TOPROL-XL) 25 MG 24 hr tablet TAKE 1 TABLET BY MOUTH EVERY DAY  . Milk Thistle 1000 MG CAPS Take 1,000 mg by mouth daily.  Marland Kitchen  Multiple Vitamin (MULTIVITAMIN WITH MINERALS) TABS tablet Take 1 tablet by mouth daily.  . palbociclib (IBRANCE) 125 MG tablet TAKE 1 TABLET (125 MG TOTAL) BY MOUTH DAILY. TAKE FOR 21 DAYS ON, 7 DAYS OFF, REPEAT EVERY 28 DAYS.  Marland Kitchen predniSONE (DELTASONE) 20 MG tablet Take 2 tablets (40 mg total) by mouth daily.  . rosuvastatin (CRESTOR) 40 MG tablet Take 1 tablet (40 mg total) by mouth daily.  . Vitamin D, Ergocalciferol, (DRISDOL) 1.25 MG (50000 UNIT) CAPS capsule Take 1 capsule by mouth once a week.  . [DISCONTINUED] insulin regular (HUMULIN R) 100 units/mL injection Inject 0.12 mLs (12 Units total) into the skin 3 (three) times daily before meals. E.11.9. Hold dose for blood sugar less than 125   No facility-administered encounter medications on file as of 11/11/2020.    Follow-up: Return in about 6 months (around 05/11/2021) for follow up, chronic med issues.   Becky Sax, MD

## 2020-11-13 ENCOUNTER — Other Ambulatory Visit: Payer: Self-pay

## 2020-11-13 ENCOUNTER — Inpatient Hospital Stay: Payer: Medicare Other

## 2020-11-13 ENCOUNTER — Inpatient Hospital Stay (HOSPITAL_BASED_OUTPATIENT_CLINIC_OR_DEPARTMENT_OTHER): Payer: Medicare Other | Admitting: Hematology and Oncology

## 2020-11-13 ENCOUNTER — Ambulatory Visit (HOSPITAL_COMMUNITY)
Admission: RE | Admit: 2020-11-13 | Discharge: 2020-11-13 | Disposition: A | Discharge status: Home / Self Care | Payer: Medicare Other | Source: Ambulatory Visit | Attending: Hematology and Oncology | Admitting: Hematology and Oncology

## 2020-11-13 VITALS — BP 116/62 | HR 84 | Temp 97.8°F | Resp 18 | Ht 59.0 in | Wt 152.0 lb

## 2020-11-13 DIAGNOSIS — Z170 Estrogen receptor positive status [ER+]: Secondary | ICD-10-CM

## 2020-11-13 DIAGNOSIS — C50311 Malignant neoplasm of lower-inner quadrant of right female breast: Secondary | ICD-10-CM | POA: Insufficient documentation

## 2020-11-13 DIAGNOSIS — C7951 Secondary malignant neoplasm of bone: Secondary | ICD-10-CM

## 2020-11-13 MED ORDER — GADOBUTROL 1 MMOL/ML IV SOLN
7.0000 mL | Freq: Once | INTRAVENOUS | Status: AC | PRN
  Administered 2020-11-13: 7 mL via INTRAVENOUS

## 2020-11-13 NOTE — Assessment & Plan Note (Signed)
Right breast irregular mass lower inner quadrant retroareolar 02/19/2015: 2.2 x 1.3 x 1.7 cm with architectural distortion and skin/nipple retraction, T2 N0 stage II a clinical stage, additional benign cysts largest 1.2 cm Right breast biopsy 02/20/2015 5:00: Invasive ductal carcinoma grade 2, perineural invasion present, ER 90%, PR 70%, HER-2 negative ratio 0.95, Ki-67 15%   Breast MRI 03/07/2015: Subareolar mass 2.2 x 2.4 x 2.5 cm extending into the retracted nipple, extending inferiorly and laterally over a distance of 4 cm, several level I right axillary lymph nodes with cortical thickening up to 7 mm with a maximum diameter of 15 mm  05/16/2019:Caris molecular testing: ER positive, PI K3 CA mutation present, HER-2 negative, ER positive TMB low BRCA1 not detected, ESR 1 not detected, PD-L1 negative --------------------------------------------------------------------- Treatment summary:Anastrozole with Delton See started April 2020Ibrance added 09/29/2018 Bone scan: 09/20/2018: Progression of bone metastatic disease new metastases disease right lesser trochanter, mid left femoral diaphysis, additional bone mets involving left frontal, right iliac, right SI joint, right sixth rib, 12th rib, T12 transverse process and T10  Ibrancetoxicities:No adverse effectspreviously She resumedIbrance February 23, 2019  11/16/2018:Right mastectomy ([place])performed because the tumor was fungating: IDC, grade 1, 5.2cm, grossly involving underlying skin, clear margins.  Radiation to chest wall: 12/19/18- 01/30/19 Bone scan 06/26/2019: Stable distribution of bone metastases. CT CAP3/08/2020:Mixed lytic and sclerotic bone metastases several demonstrating increased lytic character concern for worsening bone mets. CT CAP 11/11/2020: Interval progression of lytic bone metastases in the thorax, thoracolumbar spine, bilateral iliac bones.  Progressive lytic changes T10 with extension into the right posterior elements  with possible involvement of right neural foramina and central canal. Bone scan 11/11/2020: Diffuse bone metastases progressed from prior new lesion in the left mid femur  Plan: 1.  MRI of thoracic spine to evaluate for cord compression 2. x-ray of the left femur for any risk of pathologic fracture 3.  Consult radiation oncology for palliative radiation 4.  Add Faslodex to Ibrance and letrozole  recheck CT scans in93month.Xgeva labs and follow-upevery 3 months

## 2020-11-17 ENCOUNTER — Telehealth: Payer: Self-pay | Admitting: Family Medicine

## 2020-11-17 NOTE — Telephone Encounter (Signed)
Patient called stating she needs a referral for her Nurse Case Manager [facility] [phone]. Patient states she would also like a call back to get the results of her labs that were completed on 11/11/20.

## 2020-11-17 NOTE — Progress Notes (Signed)
Histology and Location of Primary Cancer:  Malignant neoplasm of lower-inner quadrant of right breast of female, estrogen receptor positive  Sites of Visceral and Bony Metastatic Disease:  MRI Thoracic Spine w & w/o Contrast 11/13/2020 IMPRESSION: Multiple osseous metastatic lesions involving the thoracic and upper lumbar spine as detailed above. Largest lesion involves the T10 vertebral body with extension into the right posterior elements. Evidence for early extraosseous extension with mild-to-moderate narrowing of the adjacent right T9-10 and T10-11 neural foramina. Early/mild involvement of the adjacent right epidural space, without significant spinal stenosis or cord compression at this time. No other significant extra osseous or epidural involvement of tumor elsewhere within the thoracic spine. No pathologic fracture.  Whole Body Bone Scan 11/10/2020 IMPRESSION: Diffuse bone metastasis. The disease has clearly progressed since the bone scan from 06/25/2019. New lesion in the left mid femur. Recommend radiographs of this area to evaluate risk for a pathologic fracture.  Location(s) of Symptomatic Metastases: Lower back and pelvis  Past/Anticipated chemotherapy by medical oncology, if any:  Under care of Dr. [name] 11/13/2020 --Treatment summary:  Anastrozole with Delton See started April 2020 Ibrance added 09/29/2018 Bone scan: 09/20/2018: Progression of bone metastatic disease new metastases disease right lesser trochanter, mid left femoral diaphysis, additional bone mets involving left frontal, right iliac, right SI joint, right sixth rib, 12th rib, T12 transverse process and T10 --Plan: MRI of thoracic spine to evaluate for cord compression X-ray of the left femur for any risk of pathologic fracture Consult radiation oncology for palliative radiation Add Faslodex to Ibrance and letrozole --Recheck CT scans in 3 months.  Xgeva labs and follow-up every 3 months  Pain on a scale of  0-10 is: Reports 4 out of 10 pain to her lower back and pelvis. Reports lying flat with her legs supported is the main way she feels relief    If Spine Met(s), symptoms, if any, include: Bowel/Bladder retention or incontinence (please describe): Reports chronic constipation (since her hysterectomy ~20 years ago), but manages with stool softeners/laxatives. Denies any urinary symptoms or concerns (bladder sling placed after hysterectomy). Numbness or weakness in extremities (please describe): Reports bilateral weakness to her legs (not a new issue). States that a couple of weeks ago she wasn't able to bear weight on her left leg and had to move around with crutches  Current Decadron regimen, if applicable: Not currently prescribed  Ambulatory status? Walker? Wheelchair?: Currently able to ambulate unassisted, but occasionally struggles to bear weight/walk due to previous back injury to her back  SAFETY ISSUES: Prior radiation? Yes: 12/18/2018 through 01/30/2019 Site Technique Total Dose (Gy) Dose per Fx (Gy) Completed Fx Beam Energies  Chest Wall, Right: CW_Rt 3D 50/50 2 25/25 10X, 6X  Chest Wall, Right: CW_Rt_SCV_PAB 3D 50/50 2 25/25 6X, 10X  Chest Wall, Right: CW_Rt_Bst Electron 10/10 2 5/5 6E   Pacemaker/ICD? No Possible current pregnancy? No--hysterectomy  Is the patient on methotrexate? No  Current Complaints / other details:  Nothing else of note

## 2020-11-17 NOTE — Telephone Encounter (Signed)
Please advise about case manager

## 2020-11-17 NOTE — Progress Notes (Signed)
Radiation Oncology         [phone] ________________________________  Outpatient Re-Consultation  Name: Emily Wilson MRN: [id number]  Date: 11/18/2020  DOB: December 23, 1965  CC:Dorna Mai, MD  [name], MD   REFERRING PHYSICIAN: [name], MD  DIAGNOSIS:    ICD-10-CM   1. Bone metastasis (HCC)  C79.51       History of right breast cancer (post-mastectomy) with progression of bony metastases  Cancer Staging Malignant neoplasm of lower-inner quadrant of right breast of female, estrogen receptor positive ([place]) Staging form: Breast, AJCC 7th Edition - Pathologic stage from 12/07/2018: Stage IV (yT4b, NX, M1) - Signed by Eppie Gibson, MD on 12/07/2018   CHIEF COMPLAINT: Here for re-evaluation to discuss management of right breast cancer with bony metastases  HISTORY OF PRESENT ILLNESS::Emily Wilson is a 55 y.o. female who returns today for consideration of further radiation therapy for management of bony metastases. The patient was last seen by me in December of 2020 for her final radiation treatment directed at the right chest. The patient was unable to return for one month follow-up of radiation due to the state of the COVID-19 pandemic at the time. The patient did however speak with RN on 03/02/19 and reported good healing of her skin in the radiation fields. Evidence of bony metastases were first seen on chest CT from 05/23/2018 which showed sub-centimeter sclerotic bone lesions in the lower T-spine and pelvis. Bone scan on 09/20/2018 further revealed the progression of bone metastatic disease and new metastatic disease of the right lesser trochanter, mid left femoral diaphysis, and additional bone mets involving left frontal, right iliac, right SI joint, right sixth rib, 12th rib, T12 transverse process, and T10.  Since then, the patient has maintained close follow up with Dr. Lindi Adie in regards to metastases (treatments detailed below). Bone scan on 06/26/2019  demonstrated the stable distribution of bone metastases. The following year, CT of the chest, abdomen, and pelvis on 04/28/20 demonstrated several mixed lytic and sclerotic bone metastases; concerning for worsening bone mets. In recent history, CT of the chest abdomen and pelvis on 11/11/20 demonstrated the interval progression of lytic bone metastases in the thorax, thoracolumbar spine, and bilateral iliac bones.  Progressive lytic changes to T10, with extension into the right posterior elements, and with possible involvement of right neural foramina and central canal were also appreciated. Bone scan on 11/11/20 further revealed progression of the diffuse bony metastases from the prior new lesion in the left mid femur.   Following further progression of bone metastases, Dr. Lindi Adie ordered an MRI of the thoracic spine on 11/13/20 which further revealed multiple osseous metastatic lesions involving the thoracic and upper lumbar spine; with the largest lesion involving the T10 vertebral body with extension into the right posterior elements. Evidence was also noted for early extraosseous extension, with mild-to-moderate narrowing of the adjacent right T9-10 and T10-11 neural foramina. During follow up with Dr. Lindi Adie, the patient reported left femoral pain. X-ray of the left femur on 11/13/20 demonstrated no evidence of fracture or other focal bone lesions.  Today she reports 4 out of 10 pain to her lower back and pelvis. Reports lying flat with her legs supported is the main way she feels relief    If Spine Met(s), symptoms, if any, include: Bowel/Bladder retention or incontinence (please describe): Reports chronic constipation (since her hysterectomy ~20 years ago), but manages with stool softeners/laxatives. Denies any urinary symptoms or concerns (bladder sling placed after hysterectomy). Numbness or weakness  in extremities (please describe): Reports bilateral weakness to her legs (not a new issue). States  that a couple of weeks ago she wasn't able to bear weight on her left leg and had to move around with crutches  Current Decadron regimen, if applicable: Not currently prescribed  Ambulatory status? Walker? Wheelchair?: Currently able to ambulate unassisted, but occasionally struggles to bear weight/walk due to previous back injury to her back (spine injury in shallow pool many years ago)   PREVIOUS RADIATION THERAPY:  Radiation Treatment Dates: 12/18/2018 through 01/30/2019 Site Technique Total Dose (Gy) Dose per Fx (Gy) Completed Fx Beam Energies  Chest Wall, Right: CW_Rt 3D 50/50 2 25/25 10X, 6X  Chest Wall, Right: CW_Rt_SCV_PAB 3D 50/50 2 25/25 6X, 10X  Chest Wall, Right: CW_Rt_Bst Electron 10/10 2 5/5 6E   OTHER TREATMENT: Patient is currently on Anastrozole, Ibrance, and Faslodex followed by Dr. Lindi Adie. To review, patient began Anastrozole with Xgeva in April of 2020, Ibrance added on 09/29/2018.   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    PAST SURGICAL HISTORY: Past Surgical History:  Procedure Laterality Date   ABDOMINAL HYSTERECTOMY     APPENDECTOMY     CARPAL TUNNEL RELEASE     GANGLION CYST EXCISION Right    KNEE SURGERY Right 2007   ACL repair   TOTAL MASTECTOMY Right 11/16/2018   Procedure: RIGHT MASTECTOMY;  Surgeon: Coralie Keens, MD;  Location: [facility];  Service: General;  Laterality: Right;   TUBAL LIGATION      FAMILY HISTORY: family history includes Bladder Cancer (age of onset: 49) in her mother; Breast  cancer in her paternal aunt; COPD in her maternal grandmother and maternal uncle; Cancer (age of onset: 73) in her father; Dementia in her maternal uncle; Diabetes in her mother and paternal grandmother; Emphysema in her maternal grandmother; Multiple sclerosis in her mother and paternal aunt; Other (age of onset: 79) in her mother; Stroke in her maternal grandfather.  SOCIAL HISTORY:  reports that she quit smoking about 9 years ago. Her smoking use included cigarettes. She has a 90.00 pack-year smoking history. She has never used smokeless tobacco. She reports that she does not drink alcohol and does not use drugs.  ALLERGIES: Doxycycline, Naproxen, Codeine, Hydrocodone-acetaminophen, Ibuprofen, Lantus [insulin glargine], Meloxicam, Propoxyphene n-acetaminophen, Sulfonamide derivatives, and Tramadol  MEDICATIONS:  Current Outpatient Medications  Medication Sig Dispense Refill   Accu-Chek FastClix Lancets MISC USE TO TEST BLOOD SUGAR UP TO FOUR TIMES DAILY AS DIRECTED 204 each 2   amoxicillin (AMOXIL) 500 MG capsule Take 1 capsule (500 mg total) by mouth 3 (three) times daily. 14 capsule 0   BD INSULIN SYRINGE U/F 31G X 5/16" 0.5 ML MISC USE TO ADMINISTER INSULIN 3 TIMES DAILY WITH MEALS 300 each 2   diclofenac (VOLTAREN) 75 MG EC tablet Take 75 mg by mouth 2 (two) times daily as needed.     gabapentin (NEURONTIN) 100 MG capsule Take 3 capsules (300 mg total) by mouth 3 (three) times daily. 90 capsule 1   glucose blood (ACCU-CHEK GUIDE) test strip USE TO CHECK BLOOD SUGAR UP TO 4 TIMES A DAY. DX: E11.65 200 strip 2  HYDROcodone-acetaminophen (NORCO/VICODIN) 5-325 MG tablet Take 1 tablet by mouth 2 (two) times daily as needed.     insulin NPH Human (HUMULIN N) 100 UNIT/ML injection INJECT 12 UNITS INTO THE SKIN AT BEDTIME 10 mL 1   insulin regular (NOVOLIN R) 100 units/mL injection Inject 0.12 mLs (12 Units total) into the skin 3 (three) times daily before meals. E.11.9. Hold dose for blood sugar  less than 125 10 mL 11   letrozole (FEMARA) 2.5 MG tablet TAKE 1 TABLET BY MOUTH EVERY DAY 90 tablet 3   metoprolol succinate (TOPROL-XL) 25 MG 24 hr tablet TAKE 1 TABLET BY MOUTH EVERY DAY 90 tablet 3   Milk Thistle 1000 MG CAPS Take 1,000 mg by mouth daily.     Multiple Vitamin (MULTIVITAMIN WITH MINERALS) TABS tablet Take 1 tablet by mouth daily.     palbociclib (IBRANCE) 125 MG tablet TAKE 1 TABLET (125 MG TOTAL) BY MOUTH DAILY. TAKE FOR 21 DAYS ON, 7 DAYS OFF, REPEAT EVERY 28 DAYS. 21 tablet 3   predniSONE (DELTASONE) 20 MG tablet Take 2 tablets (40 mg total) by mouth daily. 10 tablet 0   rosuvastatin (CRESTOR) 40 MG tablet Take 1 tablet (40 mg total) by mouth daily. 90 tablet 3   Vitamin D, Ergocalciferol, (DRISDOL) 1.25 MG (50000 UNIT) CAPS capsule Take 1 capsule by mouth once a week.     No current facility-administered medications for this encounter.    REVIEW OF SYSTEMS: As above in HPI.   PHYSICAL EXAM:  weight is 154 lb 9.6 oz (70.1 kg). Her temperature is 97 F (36.1 C) (abnormal). Her blood pressure is 134/78 and her pulse is 85. Her respiration is 20 and oxygen saturation is 100%.   General: Alert and oriented, in no acute distress HEENT: Head is normocephalic.  Heart: Regular in rate and rhythm with no murmurs, rubs, or gallops. Chest: Clear to auscultation bilaterally, with no rhonchi, wheezes, or rales. Extremities: No cyanosis or edema. Musculoskeletal: symmetric strength and muscle tone throughout. Tenderness to palpation in lower T spine and in sacrum Neurologic:  No obvious focalities. Speech is fluent. Coordination is intact. Sensation intact in arms/legs Psychiatric: Judgment and insight are intact. Affect is appropriate.   ECOG = 1  0 - Asymptomatic (Fully active, able to carry on all predisease activities without restriction)  1 - Symptomatic but completely ambulatory (Restricted in physically strenuous activity but ambulatory and able to carry out work of a  light or sedentary nature. For example, light housework, office work)  2 - Symptomatic, <50% in bed during the day (Ambulatory and capable of all self care but unable to carry out any work activities. Up and about more than 50% of waking hours)  3 - Symptomatic, >50% in bed, but not bedbound (Capable of only limited self-care, confined to bed or chair 50% or more of waking hours)  4 - Bedbound (Completely disabled. Cannot carry on any self-care. Totally confined to bed or chair)  5 - Death   Eustace Pen MM, Creech RH, Tormey DC, et al. [phone]). "Toxicity and response criteria of the [facility]". [place] Oncol. 5 (6): 649-55   LABORATORY DATA:  Lab Results  Component Value Date   WBC 6.8 10/28/2020   HGB 13.8 10/28/2020   HCT 40.9 10/28/2020   MCV 94.5 10/28/2020   PLT 331 10/28/2020   CMP     Component Value Date/Time   NA 141 10/28/2020 0902   NA 136 08/14/2018 1646  K 4.3 10/28/2020 0902   CL 105 10/28/2020 0902   CO2 24 10/28/2020 0902   GLUCOSE 103 (H) 10/28/2020 0902   BUN 18 10/28/2020 0902   BUN 21 08/14/2018 1646   CREATININE 0.77 10/28/2020 0902   CALCIUM 9.8 10/28/2020 0902   PROT 7.3 10/28/2020 0902   PROT 7.0 08/14/2018 1646   ALBUMIN 3.7 10/28/2020 0902   ALBUMIN 4.1 08/14/2018 1646   AST 18 10/28/2020 0902   ALT 16 10/28/2020 0902   ALKPHOS 217 (H) 10/28/2020 0902   BILITOT 0.4 10/28/2020 0902   GFRNONAA >60 10/28/2020 0902   GFRAA >60 11/02/2019 1231         RADIOGRAPHY: MR THORACIC SPINE W WO CONTRAST  Result Date: 11/13/2020 CLINICAL DATA:  Initial evaluation for cord compression, leg weakness. History of metastatic breast cancer. EXAM: MRI THORACIC WITHOUT AND WITH CONTRAST TECHNIQUE: Multiplanar and multiecho pulse sequences of the thoracic spine were obtained without and with intravenous contrast. CONTRAST:  57m GADAVIST GADOBUTROL 1 MMOL/ML IV SOLN COMPARISON:  Comparison made with prior CT from 11/10/2020. FINDINGS:  Alignment: Physiologic with preservation of the normal thoracic kyphosis. No listhesis. Vertebrae: Multiple osseous metastatic lesions seen involving the thoracic spine, with most prominent lesions seen involving the T6, T9, and T10 vertebral bodies. Additional smaller lesions noted involving the left inferior articular process at T4 (series 22, image 11), the T7 and T8 vertebral bodies (series 22, image 8), right transverse process of T9 (series 23, image 5), and the right posterior elements of T12 and L1 (series 23, images 5, 6). Additional subcentimeter lesion noted within the T12 vertebral body itself (series 23, image 7). Largest lesion involves the right and posterior aspect of T10 with extension into the adjacent right posterior elements (series 18, image 6). Associated mild/early extraosseous extension with mild-to-moderate narrowing of the adjacent right T9-10 and T10-11 neural foramina. Early epidural involvement along the right aspect of the thecal sac at this level (series 24, image 31). No significant spinal stenosis or evidence for cord compression at this time. Involvement of the contralateral left transverse process of T10 noted as well (series 23, image 15). No other significant extra osseous extension or epidural involvement at this time. No definite pathologic fracture. Cord: Normal signal and morphology. No other abnormal intradural or intracanalicular enhancement. Paraspinal and other soft tissues: Paraspinous soft tissues otherwise within normal limits. Visualized visceral structures unremarkable. Disc levels: No significant degenerative spondylosis for patient age. No significant disc bulge or focal disc herniation. Other than the mild-to-moderate narrowing about the right neural foramina at T9-10 and T10-11 related to adjacent osseous metastatic disease, no other significant foraminal encroachment. IMPRESSION: 1. Multiple osseous metastatic lesions involving the thoracic and upper lumbar  spine as detailed above. Largest lesion involves the T10 vertebral body with extension into the right posterior elements. Evidence for early extraosseous extension with mild-to-moderate narrowing of the adjacent right T9-10 and T10-11 neural foramina. Early/mild involvement of the adjacent right epidural space, without significant spinal stenosis or cord compression at this time. 2. No other significant extra osseous or epidural involvement of tumor elsewhere within the thoracic spine. No pathologic fracture. Electronically Signed   By: BJeannine BogaM.D.   On: 11/13/2020 22:44   NM Bone Scan Whole Body  Result Date: 11/11/2020 CLINICAL DATA:  Breast cancer, staging.  Bone metastasis. EXAM: NUCLEAR MEDICINE WHOLE BODY BONE SCAN TECHNIQUE: Whole body anterior and posterior images were obtained approximately 3 hours after intravenous injection of radiopharmaceutical. RADIOPHARMACEUTICALS:  21.1 mCi  Technetium-61mMDP IV COMPARISON:  CT chest, abdomen and pelvis 11/10/2020 and bone scan 06/25/2019 FINDINGS: Multiple foci of abnormal bone activity compatible with bone metastasis. Enlarged lesion in the left calvarium and there may be a new small lesion on the right calvarium. Prominent lesions involving vertebral bodies at T9, T10 and L5. Evidence for additional small lesions at T6 and T4 region. There is likely a lesion in the upper cervical spine. Bilateral iliac bone lesions. New focus of uptake in the mid left femur. New areas of uptake involving bilateral ribs with increased activity involving the left scapula. New uptake in the right shoulder region which is probably involving the proximal humerus. Expected uptake in the renal collecting system. New uptake in the left maxillary region is nonspecific and could be related to dental disease. Probable lesion involving the lower sternum. IMPRESSION: 1. Diffuse bone metastasis. The disease has clearly progressed since the bone scan from 06/25/2019. 2. New  lesion in the left mid femur. Recommend radiographs of this area to evaluate risk for a pathologic fracture. Electronically Signed   By: AMarkus DaftM.D.   On: 11/11/2020 09:16   DG FEMUR MIN 2 VIEWS LEFT  Result Date: 11/15/2020 CLINICAL DATA:  Breast cancer. Rule out lytic lesion. Left femur pain. EXAM: LEFT FEMUR 2 VIEWS COMPARISON:  None. FINDINGS: There is no evidence of fracture or other focal bone lesions. Soft tissues are unremarkable. IMPRESSION: Negative. Electronically Signed   By: CFranchot GalloM.D.   On: 11/15/2020 09:13   CT CHEST ABDOMEN PELVIS WO CONTRAST  Addendum Date: 11/11/2020   ADDENDUM REPORT: 11/11/2020 09:26 ADDENDUM: IMPRESSION: 1. Interval progression of lytic bone metastases involving the bony thorax, thoracolumbar spine, and bilateral iliac bones. 2. Progressive lytic changes involving the T10 vertebral body with extension into the right posterior elements. There is possible involvement of the right neural foramina and central canal. This could be further assessed with contrast enhanced MRI of the thoracic spine. 3. New 3 mm subpleural nodule within the anterior left upper lobe. 4. Aortic Atherosclerosis (ICD10-I70.0) and Emphysema (ICD10-J43.9). Electronically Signed   By: TKerby MoorsM.D.   On: 11/11/2020 09:26   Result Date: 11/11/2020 CLINICAL DATA:  Breast cancer restaging. EXAM: CT CHEST, ABDOMEN AND PELVIS WITHOUT CONTRAST TECHNIQUE: Multidetector CT imaging of the chest, abdomen and pelvis was performed following the standard protocol without IV contrast. COMPARISON:  04/28/2020 FINDINGS: CT CHEST FINDINGS Cardiovascular: Aortic atherosclerosis and coronary artery calcifications. No pericardial effusion. Normal heart size. Mediastinum/Nodes: No enlarged mediastinal, hilar, or axillary lymph nodes. Thyroid gland, trachea, and esophagus demonstrate no significant findings. Lungs/Pleura: Paraseptal and centrilobular emphysema. No pleural effusion, airspace  consolidation, or atelectasis. Stable perifissural nodule within the major fissure measuring 3 mm, image 92/4. Subpleural nodule within the anterior left upper lobe measures 3 mm, image 66/4. New from the previous exam. Musculoskeletal: Mixed lytic and sclerotic bone lesions are again identified compatible with osseous metastasis. When compared with the previous exam there has been progressive lytic changes associated with previous sclerotic lesion involving the lateral aspect of the right sixth rib, image 62/4. Similarly, there are progressive lytic changes involving the previous lesion in the anterior left second rib, image 27/4. Similar appearance of mixed lytic and sclerotic changes involving the inferior aspect of the left scapula. Increased lucent lesions involving the anterior aspect of the right humeral head, image 8/5. Progressive lytic changes are identified involving the previous sclerotic metastasis involving T9 and T10 vertebra. At the T10 level there is  increased destructive changes involving the right posterior elements with possible involvement of the canal and right neural foramina, image 41/2. CT ABDOMEN PELVIS FINDINGS Hepatobiliary: No focal liver abnormality is seen. No gallstones, gallbladder wall thickening, or biliary dilatation. Pancreas: Unremarkable. No pancreatic ductal dilatation or surrounding inflammatory changes. Spleen: Normal in size without focal abnormality. Adrenals/Urinary Tract: Adrenal glands are unremarkable. Kidneys are normal, without renal calculi, focal lesion, or hydronephrosis. Bladder is unremarkable. Stomach/Bowel: Stomach appears normal. Sigmoid diverticulosis. No bowel wall thickening, inflammation, or distension. Vascular/Lymphatic: Aortic atherosclerosis. No aneurysm. No abdominopelvic adenopathy. Reproductive: Status post hysterectomy. No adnexal masses. Other: No ascites or focal fluid collections. Musculoskeletal: Interval progression lytic bone metastases  involving bilateral iliac bones. Progressive lytic changes are also noted involving the L5 vertebral body. IMPRESSION: 1. Interval progression of lytic bone metastases involving the bony thorax, thoracolumbar spine, and bilateral iliac bones. 2. Progressive lytic changes involving the T11 vertebral body with extension into the right posterior elements. There is possible involvement of the right neural foramina and central canal. This could be further assessed with contrast enhanced MRI of the thoracic spine. 3. New 3 mm subpleural nodule within the anterior left upper lobe. 4. Aortic Atherosclerosis (ICD10-I70.0) and Emphysema (ICD10-J43.9). Electronically Signed: By: Kerby Moors M.D. On: 11/11/2020 08:30      IMPRESSION/PLAN: This is a lovely 55 yo woman with metastatic breast cancer to multiple bony sites   I recommend palliative RT - 30Gy/10 fx - to the lower T spine, L-S spine, and mid left femur. I've reviewed her imaging personally and these are the most concerning sites of disease corresponding to her symptoms.  We discussed the risks, benefits, and side effects of radiotherapy which may included but not necessarily be limited to: fatigue, skin irritation, GI upset, bladder irritation, bone fracture, rare injury to internal organs of chest, abdomen, and pelvis. The patient is enthusiastic about proceeding with treatment. I look forward to participating in the patient's care. Consent signed today, we will plan RT today and start it tomorrow.  On date of service, in total, I spent 30 minutes on this encounter. Patient was seen in person.   __________________________________________   Eppie Gibson, MD  This document serves as a record of services personally performed by Eppie Gibson, MD. It was created on her behalf by Roney Mans, a trained medical scribe. The creation of this record is based on the scribe's personal observations and the provider's statements to them. This document has been  checked and approved by the attending provider.

## 2020-11-18 ENCOUNTER — Ambulatory Visit
Admission: RE | Admit: 2020-11-18 | Discharge: 2020-11-18 | Disposition: A | Discharge status: Home / Self Care | Payer: Medicare Other | Source: Ambulatory Visit | Attending: Radiation Oncology | Admitting: Radiation Oncology

## 2020-11-18 ENCOUNTER — Other Ambulatory Visit: Payer: Self-pay

## 2020-11-18 VITALS — BP 134/78 | HR 85 | Temp 97.0°F | Resp 20 | Wt 154.6 lb

## 2020-11-18 DIAGNOSIS — Z79899 Other long term (current) drug therapy: Secondary | ICD-10-CM | POA: Diagnosis not present

## 2020-11-18 DIAGNOSIS — E119 Type 2 diabetes mellitus without complications: Secondary | ICD-10-CM | POA: Diagnosis not present

## 2020-11-18 DIAGNOSIS — E785 Hyperlipidemia, unspecified: Secondary | ICD-10-CM | POA: Diagnosis not present

## 2020-11-18 DIAGNOSIS — Z87891 Personal history of nicotine dependence: Secondary | ICD-10-CM | POA: Insufficient documentation

## 2020-11-18 DIAGNOSIS — M79652 Pain in left thigh: Secondary | ICD-10-CM | POA: Insufficient documentation

## 2020-11-18 DIAGNOSIS — K5909 Other constipation: Secondary | ICD-10-CM | POA: Diagnosis not present

## 2020-11-18 DIAGNOSIS — Z510 Encounter for antineoplastic radiation therapy: Secondary | ICD-10-CM | POA: Diagnosis not present

## 2020-11-18 DIAGNOSIS — J432 Centrilobular emphysema: Secondary | ICD-10-CM | POA: Diagnosis not present

## 2020-11-18 DIAGNOSIS — R911 Solitary pulmonary nodule: Secondary | ICD-10-CM | POA: Insufficient documentation

## 2020-11-18 DIAGNOSIS — C7951 Secondary malignant neoplasm of bone: Secondary | ICD-10-CM

## 2020-11-18 DIAGNOSIS — C50211 Malignant neoplasm of upper-inner quadrant of right female breast: Secondary | ICD-10-CM | POA: Insufficient documentation

## 2020-11-18 DIAGNOSIS — F17210 Nicotine dependence, cigarettes, uncomplicated: Secondary | ICD-10-CM | POA: Insufficient documentation

## 2020-11-18 DIAGNOSIS — I700 Atherosclerosis of aorta: Secondary | ICD-10-CM | POA: Insufficient documentation

## 2020-11-18 DIAGNOSIS — Z794 Long term (current) use of insulin: Secondary | ICD-10-CM | POA: Insufficient documentation

## 2020-11-18 DIAGNOSIS — Z170 Estrogen receptor positive status [ER+]: Secondary | ICD-10-CM | POA: Insufficient documentation

## 2020-11-18 DIAGNOSIS — I341 Nonrheumatic mitral (valve) prolapse: Secondary | ICD-10-CM | POA: Diagnosis not present

## 2020-11-18 DIAGNOSIS — R011 Cardiac murmur, unspecified: Secondary | ICD-10-CM | POA: Diagnosis not present

## 2020-11-18 DIAGNOSIS — Z923 Personal history of irradiation: Secondary | ICD-10-CM | POA: Diagnosis not present

## 2020-11-19 ENCOUNTER — Other Ambulatory Visit (HOSPITAL_COMMUNITY): Payer: Self-pay

## 2020-11-19 ENCOUNTER — Other Ambulatory Visit: Payer: Self-pay | Admitting: Hematology and Oncology

## 2020-11-19 ENCOUNTER — Other Ambulatory Visit: Payer: Self-pay

## 2020-11-19 ENCOUNTER — Ambulatory Visit
Admission: RE | Admit: 2020-11-19 | Discharge: 2020-11-19 | Disposition: A | Discharge status: Home / Self Care | Payer: Medicare Other | Source: Ambulatory Visit | Attending: Radiation Oncology | Admitting: Radiation Oncology

## 2020-11-19 DIAGNOSIS — Z510 Encounter for antineoplastic radiation therapy: Secondary | ICD-10-CM | POA: Diagnosis not present

## 2020-11-19 NOTE — Patient Outreach (Signed)
Care Coordination  11/19/2020  Emily Wilson 06/28/1965 [id number]  RNCM attempting telephone visit for follow up outreach. Per appointment desk, patient is attending another appointment at this time. RNCM will reschedule this phone visit.  Lurena Joiner RN, BSN [place]  [employer] manager

## 2020-11-20 ENCOUNTER — Inpatient Hospital Stay: Payer: Medicare Other

## 2020-11-20 ENCOUNTER — Other Ambulatory Visit: Payer: Self-pay

## 2020-11-20 ENCOUNTER — Encounter: Payer: Self-pay | Admitting: Radiation Oncology

## 2020-11-20 ENCOUNTER — Ambulatory Visit
Admission: RE | Admit: 2020-11-20 | Discharge: 2020-11-20 | Disposition: A | Discharge status: Home / Self Care | Payer: Medicare Other | Source: Ambulatory Visit | Attending: Radiation Oncology | Admitting: Radiation Oncology

## 2020-11-20 VITALS — BP 122/81 | HR 91 | Temp 98.3°F | Resp 16

## 2020-11-20 DIAGNOSIS — C7951 Secondary malignant neoplasm of bone: Secondary | ICD-10-CM

## 2020-11-20 DIAGNOSIS — Z510 Encounter for antineoplastic radiation therapy: Secondary | ICD-10-CM | POA: Diagnosis not present

## 2020-11-20 MED ORDER — FULVESTRANT 250 MG/5ML IM SOLN
500.0000 mg | Freq: Once | INTRAMUSCULAR | Status: AC
  Administered 2020-11-20: 500 mg via INTRAMUSCULAR
  Filled 2020-11-20: qty 10

## 2020-11-20 NOTE — Patient Instructions (Signed)
Fulvestrant injection What is this medication? FULVESTRANT (ful VES trant) blocks the effects of estrogen. It is used to treat breast cancer. This medicine may be used for other purposes; ask your health care provider or pharmacist if you have questions. COMMON BRAND NAME(S): FASLODEX What should I tell my care team before I take this medication? They need to know if you have any of these conditions: bleeding disorders liver disease low blood counts, like low white cell, platelet, or red cell counts an unusual or allergic reaction to fulvestrant, other medicines, foods, dyes, or preservatives pregnant or trying to get pregnant breast-feeding How should I use this medication? This medicine is for injection into a muscle. It is usually given by a health care professional in a hospital or clinic setting. Talk to your pediatrician regarding the use of this medicine in children. Special care may be needed. Overdosage: If you think you have taken too much of this medicine contact a poison control center or emergency room at once. NOTE: This medicine is only for you. Do not share this medicine with others. What if I miss a dose? It is important not to miss your dose. Call your doctor or health care professional if you are unable to keep an appointment. What may interact with this medication? medicines that treat or prevent blood clots like warfarin, enoxaparin, dalteparin, apixaban, dabigatran, and rivaroxaban This list may not describe all possible interactions. Give your health care provider a list of all the medicines, herbs, non-prescription drugs, or dietary supplements you use. Also tell them if you smoke, drink alcohol, or use illegal drugs. Some items may interact with your medicine. What should I watch for while using this medication? Your condition will be monitored carefully while you are receiving this medicine. You will need important blood work done while you are taking this  medicine. Do not become pregnant while taking this medicine or for at least 1 year after stopping it. Women of child-bearing potential will need to have a negative pregnancy test before starting this medicine. Women should inform their doctor if they wish to become pregnant or think they might be pregnant. There is a potential for serious side effects to an unborn child. Men should inform their doctors if they wish to father a child. This medicine may lower sperm counts. Talk to your health care professional or pharmacist for more information. Do not breast-feed an infant while taking this medicine or for 1 year after the last dose. What side effects may I notice from receiving this medication? Side effects that you should report to your doctor or health care professional as soon as possible: allergic reactions like skin rash, itching or hives, swelling of the face, lips, or tongue feeling faint or lightheaded, falls pain, tingling, numbness, or weakness in the legs signs and symptoms of infection like fever or chills; cough; flu-like symptoms; sore throat vaginal bleeding Side effects that usually do not require medical attention (report to your doctor or health care professional if they continue or are bothersome): aches, pains constipation diarrhea headache hot flashes nausea, vomiting pain at site where injected stomach pain This list may not describe all possible side effects. Call your doctor for medical advice about side effects. You may report side effects to FDA at 1-800-FDA-1088. Where should I keep my medication? This drug is given in a hospital or clinic and will not be stored at home. NOTE: This sheet is a summary. It may not cover all possible information. If you have   questions about this medicine, talk to your doctor, pharmacist, or health care provider.  2022 Elsevier/Gold Standard (2017-05-19 11:34:41)  

## 2020-11-21 ENCOUNTER — Other Ambulatory Visit: Payer: Self-pay

## 2020-11-21 ENCOUNTER — Other Ambulatory Visit: Payer: Self-pay | Admitting: *Deleted

## 2020-11-21 ENCOUNTER — Ambulatory Visit
Admission: RE | Admit: 2020-11-21 | Discharge: 2020-11-21 | Disposition: A | Discharge status: Home / Self Care | Payer: Medicare Other | Source: Ambulatory Visit | Attending: Radiation Oncology | Admitting: Radiation Oncology

## 2020-11-21 DIAGNOSIS — E1159 Type 2 diabetes mellitus with other circulatory complications: Secondary | ICD-10-CM

## 2020-11-21 DIAGNOSIS — Z510 Encounter for antineoplastic radiation therapy: Secondary | ICD-10-CM | POA: Diagnosis not present

## 2020-11-21 MED ORDER — ONDANSETRON 8 MG PO TBDP: 8.0000 mg | ORAL_TABLET | Freq: Three times a day (TID) | ORAL | 1 refills | Status: DC | PRN

## 2020-11-21 NOTE — Progress Notes (Signed)
Notified by therapist that patient reported vomiting and diarrhea this morning. Went over to dressing room to assess patient. Patient stated she started experiencing diarrhea as soon as she left the [facility] yesterday after treatment (and reported it continued all through the night), and had 2 episodes of vomiting this morning before her appointment. Informed her Dr. Isidore Moos was out of the office today, but I would update another provider in the clinic and return with an update.   Relayed above information to Dr. Lisbeth Renshaw, and received verbal orders for patient to take prn ondansetron and OTC Imodium AD. Patient was on treatment table when I returned to [facility] area, so instructions written down on how to utilize both medications (take Imodium AD according to package directions, but do not exceed 8 tablets in a 24-hr period; take ondansetron when nausea is mild to give medication time to take effect). Wrote instructions for patient to call on-call provider over the weekend should her symptoms not improve or worsen .   Will assess patient's status during her her weekly check-in with Dr. Isidore Moos Monday 11/24/20

## 2020-11-21 NOTE — Telephone Encounter (Signed)
Called has been place to case Higher education careers adviser. I'm awaiting call back

## 2020-11-22 IMAGING — DX DG CHEST 2V
2 series · 2 of 2 positions shown · non-contrast
Comparison: 10/26/2011

CLINICAL DATA: Patient complains of SOB, wheezing, and cough X 9
days, patient was diagnosed with the flu X 8 days. Hx of diabetes
and breast cancer. Former smoker.

EXAM:
CHEST - 2 VIEW

[chest pa]
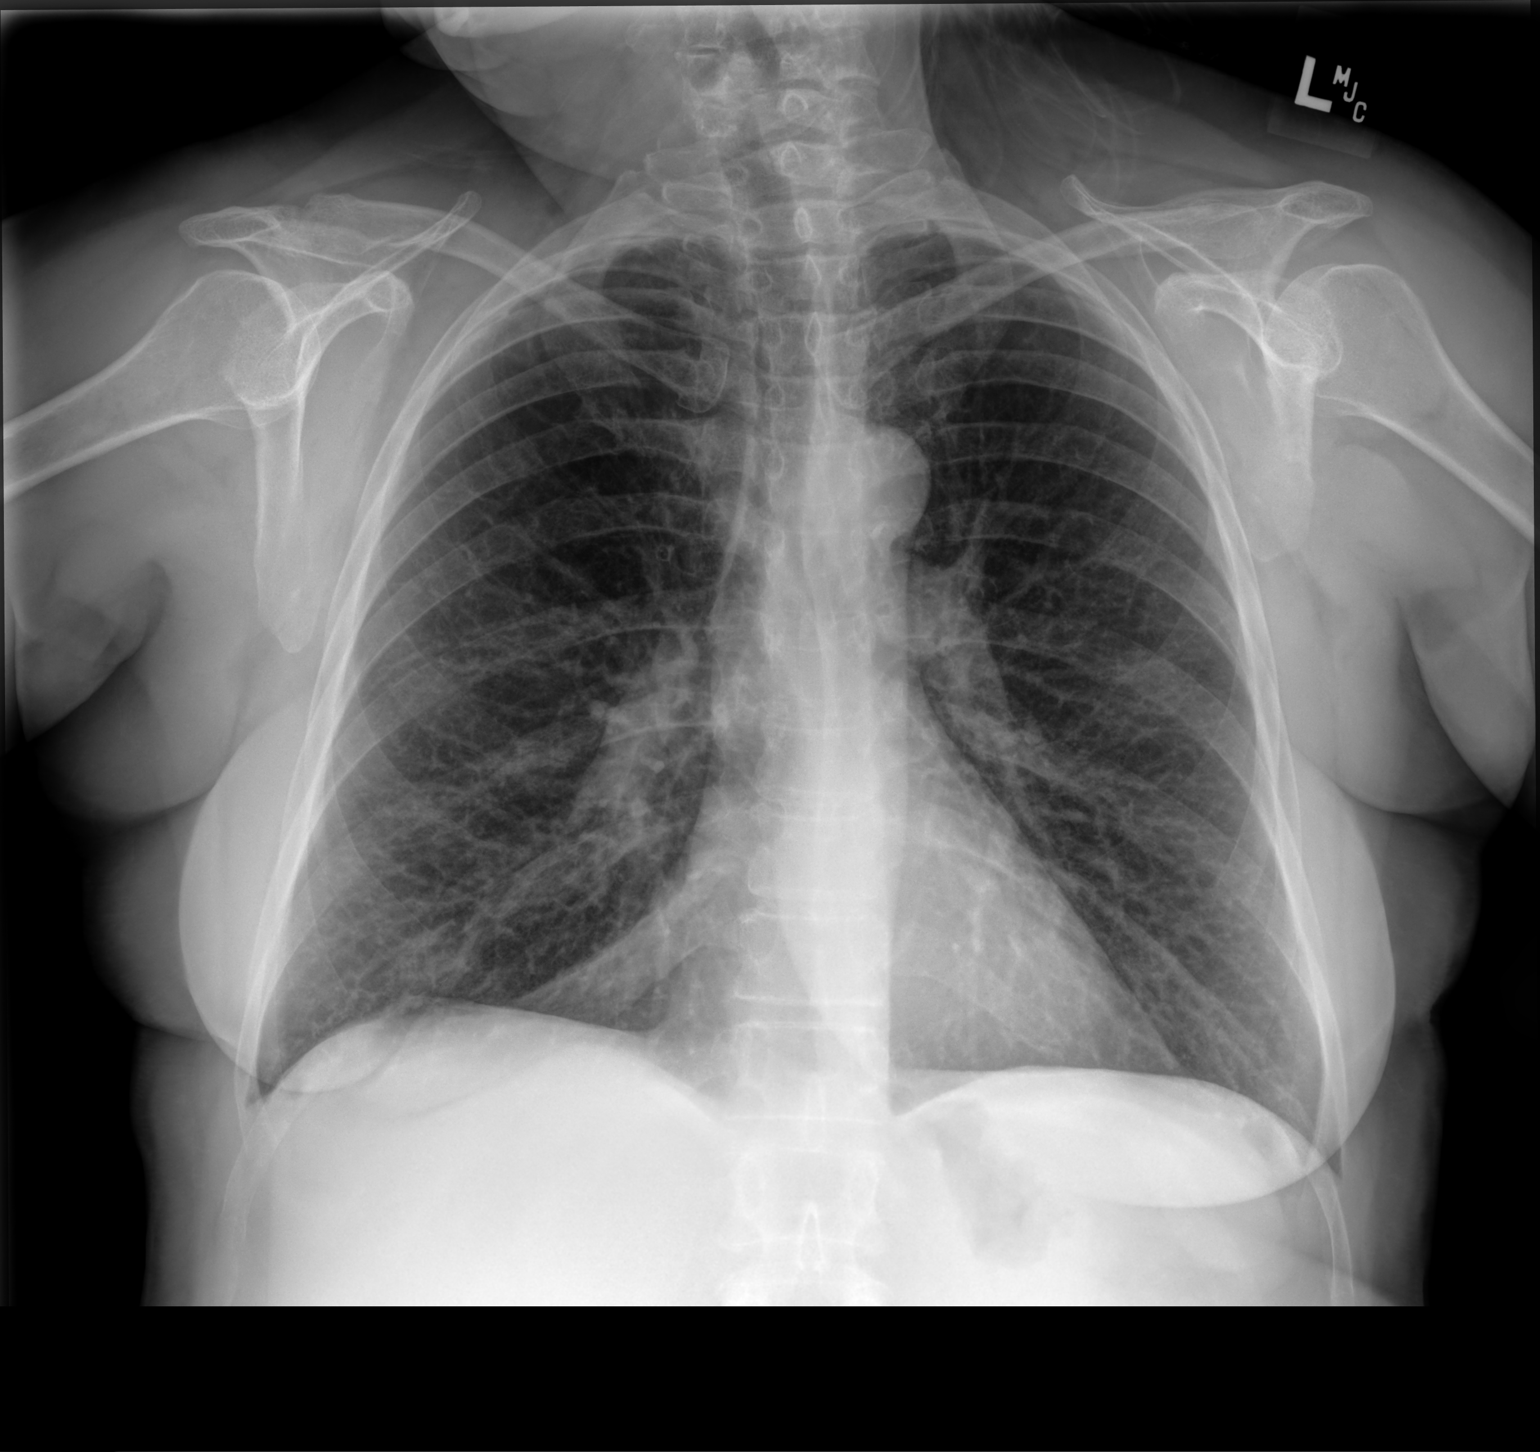

[chest lat]
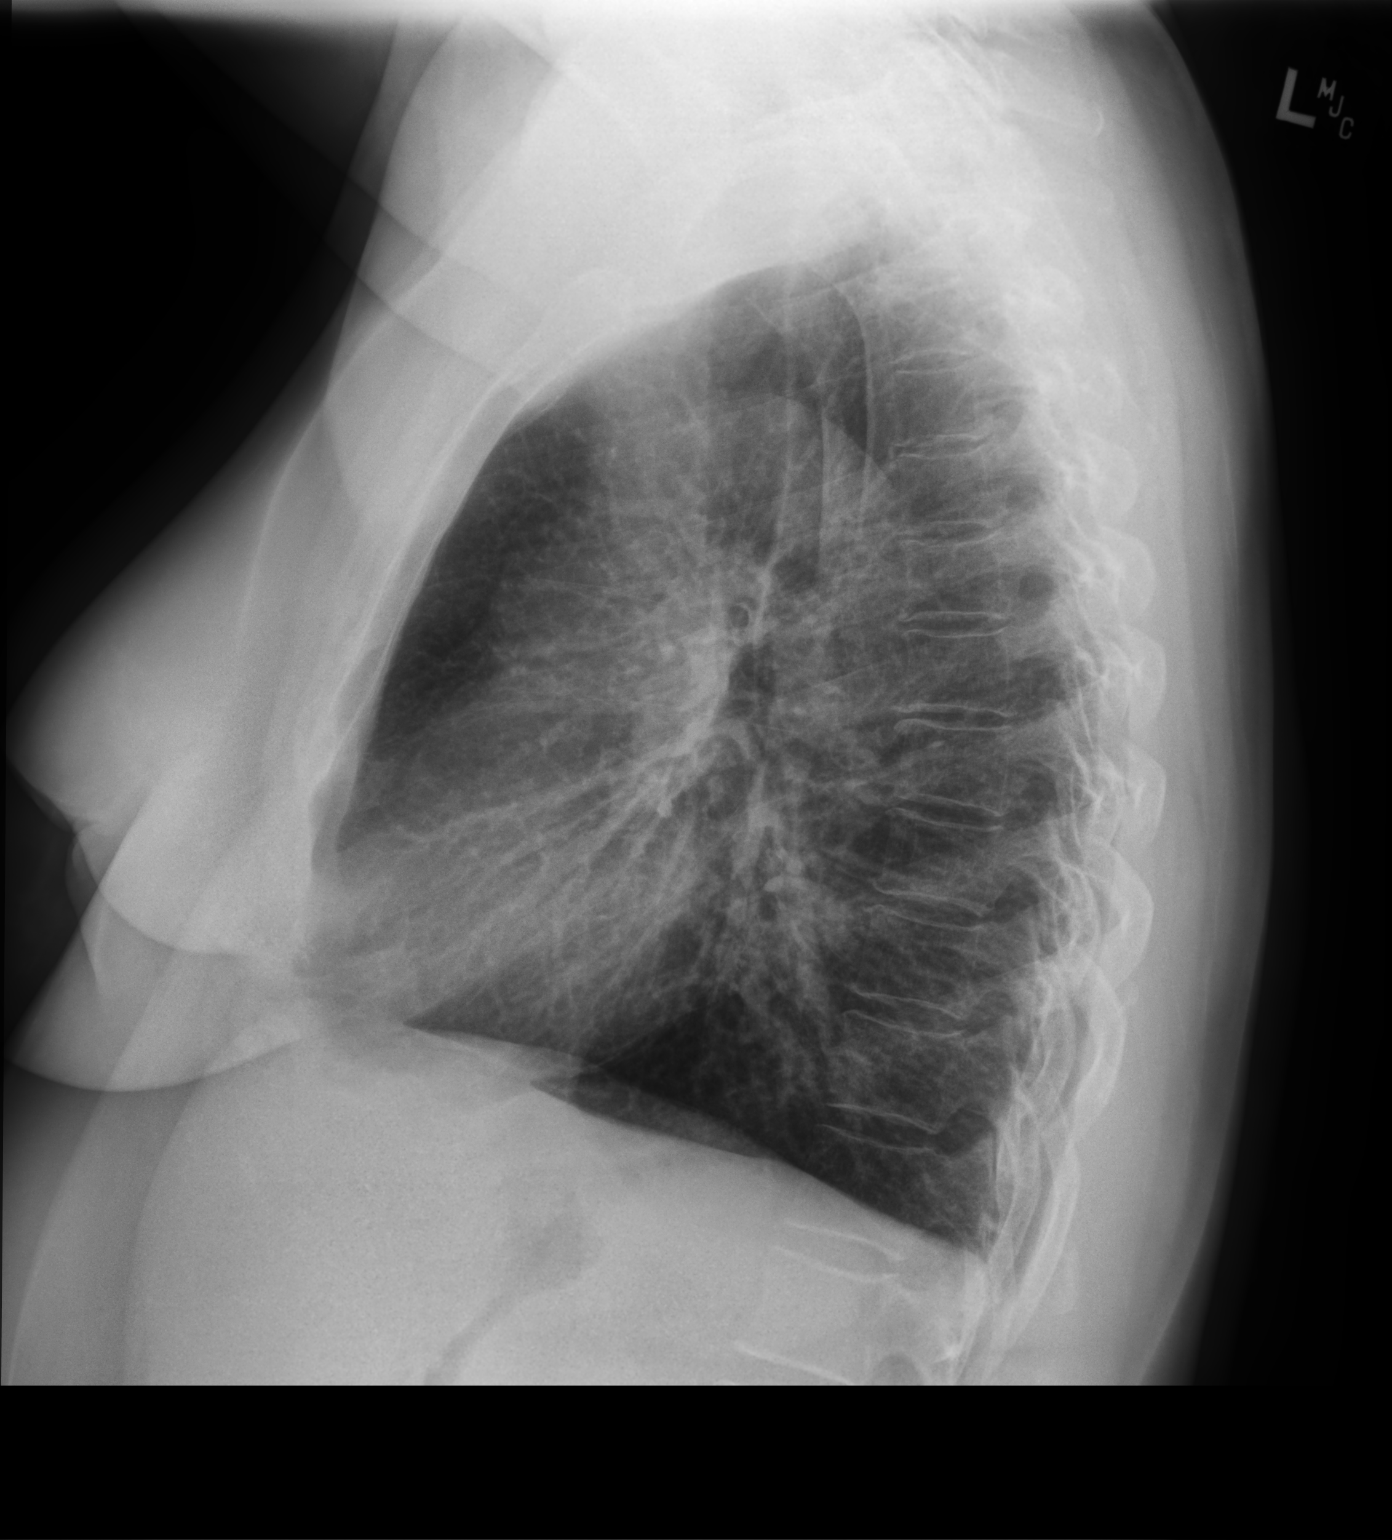

[2 of 2 positions shown; findings below may reference images not displayed]

FINDINGS: The heart size and mediastinal contours are within normal limits.
Both lungs are clear. No pleural effusion or pneumothorax. The
visualized skeletal structures are unremarkable.
IMPRESSION: No active cardiopulmonary disease.

## 2020-11-24 ENCOUNTER — Other Ambulatory Visit: Payer: Self-pay | Admitting: *Deleted

## 2020-11-24 ENCOUNTER — Ambulatory Visit
Admission: RE | Admit: 2020-11-24 | Discharge: 2020-11-24 | Disposition: A | Discharge status: Home / Self Care | Payer: Medicare Other | Source: Ambulatory Visit | Attending: Radiation Oncology | Admitting: Radiation Oncology

## 2020-11-24 ENCOUNTER — Other Ambulatory Visit: Payer: Self-pay

## 2020-11-24 DIAGNOSIS — C7951 Secondary malignant neoplasm of bone: Secondary | ICD-10-CM | POA: Diagnosis present

## 2020-11-24 DIAGNOSIS — Z923 Personal history of irradiation: Secondary | ICD-10-CM | POA: Diagnosis not present

## 2020-11-24 DIAGNOSIS — Z170 Estrogen receptor positive status [ER+]: Secondary | ICD-10-CM | POA: Diagnosis not present

## 2020-11-24 DIAGNOSIS — Z9011 Acquired absence of right breast and nipple: Secondary | ICD-10-CM | POA: Insufficient documentation

## 2020-11-24 DIAGNOSIS — C50311 Malignant neoplasm of lower-inner quadrant of right female breast: Secondary | ICD-10-CM | POA: Insufficient documentation

## 2020-11-24 DIAGNOSIS — Z510 Encounter for antineoplastic radiation therapy: Secondary | ICD-10-CM | POA: Insufficient documentation

## 2020-11-24 LAB — MICROALBUMIN / CREATININE URINE RATIO
Creatinine, Urine: 110.6 mg/dL
Microalb/Creat Ratio: <3 mg/g creat (ref 0–29)
Microalbumin, Urine: <3 ug/mL

## 2020-11-24 NOTE — Patient Outreach (Addendum)
Medicaid Managed Care   Nurse Care Manager Note  11/24/2020 Name:  Emily Wilson MRN:  [id number] DOB:  Nov 29, 1965  Emily Wilson is an 55 y.o. year old female who is a primary patient of Dorna Mai, MD.  The [facility] Managed Care Coordination team was consulted for assistance with:    DMII and cancer  Emily Wilson was given information about Medicaid Managed Care Coordination team services today. Emily Wilson Patient agreed to services and verbal consent obtained.  Engaged with patient by telephone for follow up visit in response to provider referral for case management and/or care coordination services.   Assessments/Interventions:  Review of past medical history, allergies, medications, health status, including review of consultants reports, laboratory and other test data, was performed as part of comprehensive evaluation and provision of chronic care management services.  SDOH (Social Determinants of Health) assessments and interventions performed: SDOH Interventions    Flowsheet Row Most Recent Value  SDOH Interventions   Housing Interventions Intervention Not Indicated  Transportation Interventions Intervention Not Indicated       Care Plan  Allergies  Allergen Reactions   Doxycycline Shortness Of Breath    Wheezing, shortness of breath, rash head to toe, and swelling   Naproxen Shortness Of Breath   Codeine Hives   Hydrocodone-Acetaminophen Hives   Ibuprofen     Irritant to stomach r/t diverticulitis   Lantus [Insulin Glargine]     Yeast Infections   Meloxicam Other (See Comments)    Abdominal pain    Propoxyphene N-Acetaminophen Hives   Sulfonamide Derivatives Hives   Tramadol Other (See Comments)    Abdominal Pain    Medications Reviewed Today     Reviewed by [name], RN (Registered Nurse) on 11/24/20 at [place] List Status: <None>   Medication Order Taking? Sig Documenting Provider Last Dose Status Informant  Accu-Chek FastClix  Lancets MISC [id number] Yes USE TO TEST BLOOD SUGAR UP TO FOUR TIMES DAILY AS DIRECTED [name], MD Taking Active   amoxicillin (AMOXIL) 500 MG capsule [id number] No Take 1 capsule (500 mg total) by mouth 3 (three) times daily.  Patient not taking: Reported on 11/24/2020   [name], MD Not Taking Active   BD INSULIN SYRINGE U/F 31G X 5/16" 0.5 ML MISC [id number] Yes USE TO ADMINISTER INSULIN 3 TIMES DAILY WITH MEALS [name], MD Taking Active   diclofenac (VOLTAREN) 75 MG EC tablet [id number] Yes Take 75 mg by mouth 2 (two) times daily as needed. [provider] Taking Active   gabapentin (NEURONTIN) 100 MG capsule [id number] Yes Take 3 capsules (300 mg total) by mouth 3 (three) times daily. [name], MD Taking Active            Med Note [name], Huriel Matt A   Mon Nov 24, 2020  9:28 AM) Taking as needed  glucose blood (ACCU-CHEK GUIDE) test strip [id number] Yes USE TO CHECK BLOOD SUGAR UP TO 4 TIMES A DAY. DX: E11.65 [name], MD Taking Active   HYDROcodone-acetaminophen (NORCO/VICODIN) 5-325 MG tablet [id number] Yes Take 1 tablet by mouth 2 (two) times daily as needed. [provider] Taking Active   insulin NPH Human (HUMULIN N) 100 UNIT/ML injection [id number] No INJECT 12 UNITS INTO THE SKIN AT BEDTIME  Patient not taking: Reported on 11/24/2020   [name], MD Not Taking Active            Med Note (Emori Mumme A   Mon  Nov 24, 2020  9:26 AM) Not taking due to decreased appetite  insulin regular (NOVOLIN R) 100 units/mL injection [id number] Yes Inject 0.12 mLs (12 Units total) into the skin 3 (three) times daily before meals. E.11.9. Hold dose for blood sugar less than 125 Dorna Mai, MD Taking Active   letrozole [facility]) 2.5 MG tablet [id number] Yes TAKE 1 TABLET BY MOUTH EVERY DAY [name], MD Taking Active   metoprolol succinate (TOPROL-XL) 25 MG 24 hr tablet [id number] Yes TAKE 1 TABLET BY MOUTH EVERY DAY  [name], MD Taking Active   Milk Thistle 1000 MG CAPS [id number] No Take 1,000 mg by mouth daily.  Patient not taking: Reported on 11/24/2020   [provider] Not Taking Active   Multiple Vitamin (MULTIVITAMIN WITH MINERALS) TABS tablet [id number] Yes Take 1 tablet by mouth daily. [provider] Taking Active Self  ondansetron (ZOFRAN ODT) 8 MG disintegrating tablet [id number] Yes Take 1 tablet (8 mg total) by mouth every 8 (eight) hours as needed for nausea or vomiting. Kyung Rudd, MD Taking Active   palbociclib [facility]) 125 MG tablet [id number] Yes TAKE 1 TABLET (125 MG TOTAL) BY MOUTH DAILY. TAKE FOR 21 DAYS ON, 7 DAYS OFF, REPEAT EVERY 28 DAYS. [name], MD Taking Active   predniSONE (DELTASONE) 20 MG tablet [id number] No Take 2 tablets (40 mg total) by mouth daily.  Patient not taking: Reported on 11/24/2020   Vanessa Kick, MD Not Taking Active            Med Note [name], Karmela Bram A   Mon Nov 24, 2020  9:32 AM) completed  rosuvastatin (CRESTOR) 40 MG tablet [id number] Yes Take 1 tablet (40 mg total) by mouth daily. [name], MD Taking Active   Vitamin D, Ergocalciferol, (DRISDOL) 1.25 MG (50000 UNIT) CAPS capsule [id number] Yes Take 1 capsule by mouth once a week. [name], MD Taking Active   Med List Note [name], [facility] 10/02/18 1151): Ibrance filled out [place]            Patient Active Problem List   Diagnosis Date Noted   History of shingles 01/09/2019   S/P mastectomy, right 11/16/2018   Bone metastasis ([place]) 05/31/2018   Family history of breast cancer in female 03/13/2015   Malignant neoplasm of lower-inner quadrant of right breast of female, estrogen receptor positive ([place]) 02/25/2015   DM (diabetes mellitus), type 2 ([place]) 02/25/2015   HLD (hyperlipidemia) 02/25/2015   Palpitations 01/26/2010   Anxiety state 05/26/2009   Hyperthyroidism 11/14/2006   Hypertension 11/14/2006     Conditions to be addressed/monitored per PCP order:  DMII and cancer  Care Plan : Diabetes Management  Updates made by [name], RN since 11/24/2020 12:00 AM     Problem: Glycemic Management (Diabetes, Type 2)      Long-Range Goal: Glycemic Management Optimized   Start Date: 01/16/2020  Expected End Date: 12/24/2020  Recent Progress: On track  Priority: High  Note:    Current Barriers:  Chronic Disease Management support and education needs related to Diabetes . Ms Wilson manages her diabetes by eating a healthy, plant based diet, checking blood sugars 3-4 times daily, attending routine screening appointments, and walking on days when the weather is nice. Emily Wilson was + Covid in February, feeling better, continues to have mild cough and runny nose. She is experiencing muscle weakness due to [place]. Plans to purchase a device to help open jars when she  gets paid. Recent A1C 7.7, admits to liking Reece cups, otherwise adheres to a healthy diet. Denies any needs at this time. She is getting exercise by working in her garden and enjoys eating the fresh vegetables. PCP recently increased insulin dose and she feels like her readings are improved. Emily Wilson is experiencing sciatica pain and is concerned her cancer has metastasized. She has an imaging study on 9/19 and plans to notify Dr. Lindi Adie for appropriate plan of care. She also expresses concern regarding her insurance change to Medicare and [place] Access in which she did not request.-Update-Emily Wilson is undergoing radiation therapy for bone mets to femur and spine. She would like to continue working with [facility] for health management. She had improvement in her A1C to 6.6, and is concerned that recent insulin change will affect her readings. Due to decreased appetite she has stopped taking long acting insulin. She drives herself to all appointments, but has a friend that will help her if needed. She has established care with new  PCP. Nurse Case Manager Clinical Goal(s):  patient will meet with RN Care Manager to address barriers to managing medical care-Met-Ms Wilson reports not barriers at this time the patient will demonstrate ongoing self health care management ability as evidenced by improving A1C Interventions:  Inter-disciplinary care team collaboration (see longitudinal plan of care) Evaluation of current treatment plan related to Diabetes and patient's adherence to plan as established by provider. Discussed plans with patient for ongoing care management follow up and provided patient with direct contact information for care management team Reviewed scheduled/upcoming provider appointments Assisted in rescheduling next appointment with MM Pharmacist to 12/19/20 @ 10am, due to patient's scheduling conflict Advised patient, providing education and rationale, to check cbg as directed by PCP and record, calling PCP for findings outside established parameters.   Provided therapeutic listening Patient Goals/Self-Care Activities - Self administers medications as prescribed - Attends all scheduled provider appointments - Calls pharmacy for medication refills - Calls provider office for new concerns or questions - check blood sugar at prescribed times - check blood sugar if I feel it is too high or too low - enter blood sugar readings and medication or insulin into daily log - take the blood sugar log to all doctor visits - take the blood sugar meter to all doctor visits -continue your diet of plant based food choices, limiting carbohydrates and sugars -exercise daily as tolerated -limit Reece cup intake, increase protein intake  Follow Up Plan: Telephone follow up appointment with Managed Medicaid care management team member scheduled for:12/24/20 @ 9am       Follow Up:  Patient agrees to Care Plan and Follow-up.  Plan: The Managed Medicaid care management team will reach out to the patient again over the  next 30 days.  Date/time of next scheduled RN care management/care coordination outreach:  12/24/20 @ [place] RN, BSN [place]  [employer] manager

## 2020-11-24 NOTE — Patient Instructions (Signed)
Visit Information  [name] was given information about Medicaid Managed Care team care coordination services and verbally consented to engagement with the [facility] Managed Care team.    Please see education materials related to diet provided by MyChart link. and as Ad[employer] account planner.   The patient verbalized understanding of instructions provided today and agreed to receive a mailed copy of patient instruction and/or educational materials.  Telephone follow up appointment with Managed Medicaid care management team member scheduled for:12/24/20 @ [place], RN  Following is a copy of your plan of care:  Patient Care Plan: Diabetes Management     Problem Identified: Glycemic Management (Diabetes, Type 2)      Long-Range Goal: Glycemic Management Optimized   Start Date: 01/16/2020  Expected End Date: 12/24/2020  Recent Progress: On track  Priority: High  Note:    Current Barriers:  Chronic Disease Management support and education needs related to Diabetes . Ms Emily Wilson manages her diabetes by eating a healthy, plant based diet, checking blood sugars 3-4 times daily, attending routine screening appointments, and walking on days when the weather is nice. [name] was + Covid in February, feeling better, continues to have mild cough and runny nose. She is experiencing muscle weakness due to [place]. Plans to purchase a device to help open jars when she gets paid. Recent A1C 7.7, admits to liking Reece cups, otherwise adheres to a healthy diet. Denies any needs at this time. She is getting exercise by working in her garden and enjoys eating the fresh vegetables. PCP recently increased insulin dose and she feels like her readings are improved. [name] is experiencing sciatica pain and is concerned her cancer has metastasized. She has an imaging study on 9/19 and plans to notify Dr. Lindi Adie for appropriate plan of care. She also expresses concern regarding her insurance change to  Medicare and [place] Access in which she did not request.-Update-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 She would like to continue working with [facility] for health management. She had improvement in her A1C to 6.6, and is concerned that recent insulin change will affect her readings. Due to decreased appetite she has stopped taking long acting insulin. She drives herself to all appointments, but has a friend that will help her if needed. She has established care with new PCP. Nurse Case Manager Clinical Goal(s):  patient will meet with RN Care Manager to address barriers to managing medical care-Met-Ms Emily Wilson reports not barriers at this time the patient will demonstrate ongoing self health care management ability as evidenced by improving A1C Interventions:  Inter-disciplinary care team collaboration (see longitudinal plan of care) Evaluation of current treatment plan related to Diabetes and patient's adherence to plan as established by provider. Discussed plans with patient for ongoing care management follow up and provided patient with direct contact information for care management team Reviewed scheduled/upcoming provider appointments Assisted in rescheduling next appointment with MM Pharmacist to 12/19/20 @ 10am, due to patient's scheduling conflict Advised patient, providing education and rationale, to check cbg as directed by PCP and record, calling PCP for findings outside established parameters.   Patient Goals/Self-Care Activities - Self administers medications as prescribed - Attends all scheduled provider appointments - Calls pharmacy for medication refills - Calls provider office for new concerns or questions - check blood sugar at prescribed times - check blood sugar if I feel it is too high or too low - enter blood sugar readings and  medication or insulin into daily log - take the blood sugar log to all doctor visits - take the blood sugar meter  to all doctor visits -continue your diet of plant based food choices, limiting carbohydrates and sugars -exercise daily as tolerated -limit Reece cup intake, increase protein intake  Follow Up Plan: Telephone follow up appointment with Managed Medicaid care management team member scheduled for:12/24/20 @ 9am      Patient Care Plan: Medication Management     Problem Identified: Health Promotion or Disease Self-Management (General Plan of Care)      Goal: Medication Management   Note:   Current Barriers:  Does not contact provider office for questions/concerns   Pharmacist Clinical Goal(s):  Over the next 90 days, patient will contact provider office for questions/concerns as evidenced notation of same in electronic health record through collaboration with PharmD and provider.    Interventions: Inter-disciplinary care team collaboration (see longitudinal plan of care) Comprehensive medication review performed; medication list updated in electronic medical record    Patient Goals/Self-Care Activities Over the next 90 days, patient will:  - collaborate with provider on medication access solutions  Follow Up Plan: The care management team will reach out to the patient again over the next 90 days.

## 2020-11-25 ENCOUNTER — Other Ambulatory Visit: Payer: Self-pay

## 2020-11-25 ENCOUNTER — Ambulatory Visit: Payer: Self-pay

## 2020-11-25 ENCOUNTER — Ambulatory Visit
Admission: RE | Admit: 2020-11-25 | Discharge: 2020-11-25 | Disposition: A | Discharge status: Home / Self Care | Payer: Medicare Other | Source: Ambulatory Visit | Attending: Radiation Oncology | Admitting: Radiation Oncology

## 2020-11-25 DIAGNOSIS — Z510 Encounter for antineoplastic radiation therapy: Secondary | ICD-10-CM | POA: Diagnosis not present

## 2020-11-26 ENCOUNTER — Other Ambulatory Visit: Payer: Self-pay

## 2020-11-26 ENCOUNTER — Ambulatory Visit
Admission: RE | Admit: 2020-11-26 | Discharge: 2020-11-26 | Disposition: A | Discharge status: Home / Self Care | Payer: Medicare Other | Source: Ambulatory Visit | Attending: Radiation Oncology | Admitting: Radiation Oncology

## 2020-11-26 ENCOUNTER — Other Ambulatory Visit (HOSPITAL_COMMUNITY): Payer: Self-pay

## 2020-11-26 DIAGNOSIS — Z510 Encounter for antineoplastic radiation therapy: Secondary | ICD-10-CM | POA: Diagnosis not present

## 2020-11-27 ENCOUNTER — Ambulatory Visit
Admission: RE | Admit: 2020-11-27 | Discharge: 2020-11-27 | Disposition: A | Discharge status: Home / Self Care | Payer: Medicare Other | Source: Ambulatory Visit | Attending: Radiation Oncology | Admitting: Radiation Oncology

## 2020-11-27 DIAGNOSIS — Z510 Encounter for antineoplastic radiation therapy: Secondary | ICD-10-CM | POA: Diagnosis not present

## 2020-11-28 ENCOUNTER — Ambulatory Visit
Admission: RE | Admit: 2020-11-28 | Discharge: 2020-11-28 | Disposition: A | Discharge status: Home / Self Care | Payer: Medicare Other | Source: Ambulatory Visit | Attending: Radiation Oncology | Admitting: Radiation Oncology

## 2020-11-28 ENCOUNTER — Other Ambulatory Visit: Payer: Self-pay

## 2020-11-28 DIAGNOSIS — Z510 Encounter for antineoplastic radiation therapy: Secondary | ICD-10-CM | POA: Diagnosis not present

## 2020-12-01 ENCOUNTER — Other Ambulatory Visit: Payer: Self-pay

## 2020-12-01 ENCOUNTER — Ambulatory Visit
Admission: RE | Admit: 2020-12-01 | Discharge: 2020-12-01 | Disposition: A | Discharge status: Home / Self Care | Payer: Medicare Other | Source: Ambulatory Visit | Attending: Radiation Oncology | Admitting: Radiation Oncology

## 2020-12-01 DIAGNOSIS — Z510 Encounter for antineoplastic radiation therapy: Secondary | ICD-10-CM | POA: Diagnosis not present

## 2020-12-02 ENCOUNTER — Other Ambulatory Visit: Payer: Self-pay | Admitting: *Deleted

## 2020-12-02 ENCOUNTER — Ambulatory Visit
Admission: RE | Admit: 2020-12-02 | Discharge: 2020-12-02 | Disposition: A | Discharge status: Home / Self Care | Payer: Medicare Other | Source: Ambulatory Visit | Attending: Radiation Oncology | Admitting: Radiation Oncology

## 2020-12-02 ENCOUNTER — Encounter: Payer: Self-pay | Admitting: Radiation Oncology

## 2020-12-02 DIAGNOSIS — Z510 Encounter for antineoplastic radiation therapy: Secondary | ICD-10-CM | POA: Diagnosis not present

## 2020-12-04 ENCOUNTER — Other Ambulatory Visit: Payer: Self-pay

## 2020-12-04 ENCOUNTER — Inpatient Hospital Stay: Payer: Medicare Other | Attending: Medical

## 2020-12-04 VITALS — BP 133/79 | HR 104 | Temp 98.5°F | Resp 17

## 2020-12-04 DIAGNOSIS — C50311 Malignant neoplasm of lower-inner quadrant of right female breast: Secondary | ICD-10-CM | POA: Diagnosis present

## 2020-12-04 DIAGNOSIS — Z9011 Acquired absence of right breast and nipple: Secondary | ICD-10-CM | POA: Insufficient documentation

## 2020-12-04 DIAGNOSIS — Z170 Estrogen receptor positive status [ER+]: Secondary | ICD-10-CM | POA: Insufficient documentation

## 2020-12-04 DIAGNOSIS — Z923 Personal history of irradiation: Secondary | ICD-10-CM | POA: Diagnosis not present

## 2020-12-04 DIAGNOSIS — C7951 Secondary malignant neoplasm of bone: Secondary | ICD-10-CM | POA: Diagnosis present

## 2020-12-04 MED ORDER — FULVESTRANT 250 MG/5ML IM SOLN
500.0000 mg | Freq: Once | INTRAMUSCULAR | Status: AC
  Administered 2020-12-04: 500 mg via INTRAMUSCULAR
  Filled 2020-12-04: qty 10

## 2020-12-10 ENCOUNTER — Ambulatory Visit: Payer: Self-pay

## 2020-12-17 ENCOUNTER — Other Ambulatory Visit (HOSPITAL_COMMUNITY): Payer: Self-pay

## 2020-12-18 ENCOUNTER — Other Ambulatory Visit: Payer: Self-pay

## 2020-12-18 ENCOUNTER — Inpatient Hospital Stay: Payer: Medicare Other

## 2020-12-18 VITALS — BP 130/79 | HR 94 | Temp 98.6°F | Resp 17

## 2020-12-18 DIAGNOSIS — C7951 Secondary malignant neoplasm of bone: Secondary | ICD-10-CM | POA: Diagnosis not present

## 2020-12-18 MED ORDER — FULVESTRANT 250 MG/5ML IM SOSY
500.0000 mg | PREFILLED_SYRINGE | Freq: Once | INTRAMUSCULAR | Status: AC
  Administered 2020-12-18: 500 mg via INTRAMUSCULAR
  Filled 2020-12-18: qty 10

## 2020-12-19 ENCOUNTER — Other Ambulatory Visit: Payer: Self-pay

## 2020-12-19 NOTE — Patient Outreach (Signed)
Medicaid Managed Care Pharmacy Note  12/19/2020 Name:  Emily Wilson MRN:  [id number] DOB:  26-Nov-1965  Emily Wilson is an 55 y.o. year old female who is a primary patient of Dorna Mai, MD.  The [facility] Managed Care Coordination team was consulted for assistance with disease management and care coordination needs.    Engaged with patient by telephone for follow up visit in response to referral for case management and/or care coordination services.  Ms. Dollens was given information about Medicaid Managed Care Coordination team services today. Emily Wilson Patient agreed to services and verbal consent obtained.  Objective:  Lab Results  Component Value Date   CREATININE 0.77 10/28/2020   CREATININE 0.80 08/04/2020   CREATININE 0.71 04/24/2020    Lab Results  Component Value Date   HGBA1C 6.6 (A) 11/11/2020       Component Value Date/Time   CHOL 307 (H) 11/11/2020 1424   TRIG 291 (H) 11/11/2020 1424   HDL 49 11/11/2020 1424   CHOLHDL 6.3 (H) 11/11/2020 1424   CHOLHDL 6 11/04/2015 1045   VLDL 52.6 (H) 11/04/2015 1045   LDLCALC 200 (H) 11/11/2020 1424   LDLDIRECT 210.0 11/04/2015 1045    BP Readings from Last 3 Encounters:  12/18/20 130/79  12/04/20 133/79  11/20/20 122/81     Care Plan  Allergies  Allergen Reactions   Doxycycline Shortness Of Breath    Wheezing, shortness of breath, rash head to toe, and swelling   Naproxen Shortness Of Breath   Codeine Hives   Hydrocodone-Acetaminophen Hives   Ibuprofen     Irritant to stomach r/t diverticulitis   Lantus [Insulin Glargine]     Yeast Infections   Meloxicam Other (See Comments)    Abdominal pain    Propoxyphene N-Acetaminophen Hives   Sulfonamide Derivatives Hives   Tramadol Other (See Comments)    Abdominal Pain    Medications Reviewed Today     Reviewed by Hughes Better, RPH-CPP (Pharmacist) on 12/19/20 at 1029  Med List Status: <None>   Medication Order Taking? Sig Documenting  Provider Last Dose Status Informant  Accu-Chek FastClix Lancets MISC [id number]  USE TO TEST BLOOD SUGAR UP TO FOUR TIMES DAILY AS DIRECTED [name], MD  Active   BD INSULIN SYRINGE U/F 31G X 5/16" 0.5 ML MISC [id number]  USE TO ADMINISTER INSULIN 3 TIMES DAILY WITH MEALS [name], MD  Active   diclofenac (VOLTAREN) 75 MG EC tablet [id number] Yes Take 75 mg by mouth 2 (two) times daily as needed. [provider] Taking Active            Med Note Georgina Peer, Berenis Corter   Fri Dec 19, 2020 10:06 AM) Taking once daily  gabapentin (NEURONTIN) 100 MG capsule [id number] No Take 3 capsules (300 mg total) by mouth 3 (three) times daily.  Patient not taking: Reported on 12/19/2020   [name], MD Not Taking Active            Med Note [name], MELANIE A   Mon Nov 24, 2020  9:28 AM) Taking as needed  glucose blood (ACCU-CHEK GUIDE) test strip [id number]  USE TO CHECK BLOOD SUGAR UP TO 4 TIMES A DAY. DX: E11.65 [name], MD  Active   HYDROcodone-acetaminophen (NORCO/VICODIN) 5-325 MG tablet [id number] No Take 1 tablet by mouth 2 (two) times daily as needed.  Patient not taking: Reported on 12/19/2020   [provider] Not Taking Active   insulin NPH Human (HUMULIN N)  100 UNIT/ML injection [id number] Yes INJECT 12 UNITS INTO THE SKIN AT BEDTIME [name], MD Taking Active            Med Note [name], MELANIE A   Mon Nov 24, 2020  9:26 AM) Not taking due to decreased appetite  insulin regular (NOVOLIN R) 100 units/mL injection [id number] Yes Inject 0.12 mLs (12 Units total) into the skin 3 (three) times daily before meals. E.11.9. Hold dose for blood sugar less than 125 Dorna Mai, MD Taking Active   letrozole [facility]) 2.5 MG tablet [id number] Yes TAKE 1 TABLET BY MOUTH EVERY DAY [name], MD Taking Active   metoprolol succinate (TOPROL-XL) 25 MG 24 hr tablet [id number] Yes TAKE 1 TABLET BY MOUTH EVERY DAY [name], MD Taking  Active   Milk Thistle 1000 MG CAPS [id number] No Take 1,000 mg by mouth daily.  Patient not taking: No sig reported   [provider] Not Taking Active   Multiple Vitamin (MULTIVITAMIN WITH MINERALS) TABS tablet [id number] Yes Take 1 tablet by mouth daily. [provider] Taking Active Self  ondansetron (ZOFRAN ODT) 8 MG disintegrating tablet [id number] No Take 1 tablet (8 mg total) by mouth every 8 (eight) hours as needed for nausea or vomiting.  Patient not taking: Reported on 12/19/2020   Kyung Rudd, MD Not Taking Active   palbociclib [facility] 125 MG tablet [id number] Yes TAKE 1 TABLET (125 MG TOTAL) BY MOUTH DAILY. TAKE FOR 21 DAYS ON, 7 DAYS OFF, REPEAT EVERY 28 DAYS. [name], MD Taking Active   rosuvastatin (CRESTOR) 40 MG tablet [id number] Yes Take 1 tablet (40 mg total) by mouth daily. [name], MD Taking Active   Vitamin D, Ergocalciferol, (DRISDOL) 1.25 MG (50000 UNIT) CAPS capsule [id number] No Take 1 capsule by mouth once a week.  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, estrogen receptor positive ([place]) 02/25/2015   DM (diabetes mellitus), type 2 ([place]) 02/25/2015   HLD (hyperlipidemia) 02/25/2015   Palpitations 01/26/2010   Anxiety state 05/26/2009   Hyperthyroidism 11/14/2006   Hypertension 11/14/2006    Conditions to be addressed/monitored per PCP order:  HTN, HLD, and DMII  Care Plan : General Pharmacy (Adult)  Updates made by Hughes Better, RPH-CPP since 12/19/2020 12:00 AM      Problem: Chronic Disease Management   Priority: High     Goal: Managing Chronic Disease Therapies   Start Date: 12/19/2020  Expected End Date: 03/19/2021  This Visit's Progress: On track  Priority: High  Note:   Current Barriers:  Does not remember to take all of her medications  Pharmacist Clinical Goal(s):  patient will adhere to prescribed medication regimen as evidenced by timely refill of medications  through collaboration with PharmD and provider.    Interventions: Inter-disciplinary care team collaboration (see longitudinal plan of care) Comprehensive medication review performed; medication list updated in electronic medical record  Diabetes  Controlled; current treatment: Humulin N 12 units at bedtime; Novolin R 12 units TID  with meals ;   Current glucose readings: fasting glucose: 120's, post prandial glucose: 160's  Denies hypoglycemic/hyperglycemic symptoms  Current meal patterns: breakfast: skips due to sleeping; Lunch: soup, sandwich; Dinner: soups/stews; Drinks: Sweet tea (monk fruit sugar), water  Recommended patient continue to check blood glucose 4x/day  Hypertension:  Controlled; current treatment: metoprolol succinate 25mg   Current home readings: patient reports she has not checked her blood pressure at home in quite some time due to frequent monitoring at all her other  appointments  Denies/hypotensive/hypertensive symptoms  Recommended patient check her blood pressure from time to time at home  Hyperlipidemia:  Uncontrolled current treatment: rosuvastatin 40mg ;   Current dietary patterns: see above  Collaborated with PCP to recommend ezetimibe  Pain (back)     Controlled; current treatment: diclofenac 75mg  BID prn (usually takes once daily), Norco 5-325mg  (not taking)     Rates pain as "mild" besides today which she states she is in slightly more pain than usual due to Fulvestrant injection yesterday  Neuropathy    Controlled; patient not currently  taking gabapentin    Patient states since completed radiation she has not had any more neuropathy pain    Patient Goals/Self-Care Activities patient will:  - focus on medication adherence by working on solutions to help remember to take medications (patient already using pill organizer by her bed and gave option of setting alarms) check glucose 4x/day, document, and provide at future appointments  Follow Up Plan:  Telephone follow up appointment with care management team member scheduled for: 11[/2/22 Care manager; 03/13/21 PharmD Next PCP appointment scheduled for: 05/12/21     Hughes Better PharmD, CPP High Risk Managed [facility] [phone]

## 2020-12-19 NOTE — Patient Instructions (Signed)
Visit Information  Emily Wilson was given information about Medicaid Managed Care team care coordination services as a part of their Healthy [employer] Medicaid benefit. Emily Wilson verbally consented to engagement with the [facility] Managed Care team.   If you are experiencing a medical emergency, please call 911 or report to your local emergency department or urgent care.   If you have a non-emergency medical problem during routine business hours, please contact your provider's office and ask to speak with a nurse.   For questions related to your Healthy [facility] health plan, please call: [phone] or visit the homepage here: [email or web address]  If you would like to schedule transportation through your [facility] plan, please call the following number at least 2 days in advance of your appointment: [phone]  Call the [facility] at [phone], at any time, 24 hours a day, 7 days a week. If you are in danger or need immediate medical attention call 911.  If you would like help to quit smoking, call 1-800-QUIT-NOW 305-115-2428) OR Espaol: 1-855-Djelo-Ya (9-518-841-6606) o para ms informacin haga clic aqu or Text READY to 200-400 to register via text  Emily Wilson - following are the goals we discussed in your visit today:   Goals Addressed   None    The patient verbalized understanding of instructions provided today and declined a print copy of patient instruction materials.   RN Care Manager will call on 12/24/20 Pharmacist will call on 03/13/21 Next PCP appointment:  05/12/21  Emily Wilson PharmD, CPP High Risk Managed Medicaid [place] [phone]   Following is a copy of your plan of care:   Care Plan : General Pharmacy (Adult)  Updates made by Emily Wilson, RPH-CPP since 12/19/2020 12:00 AM     Problem: Chronic Disease Management   Priority: High     Goal: Managing Chronic  Disease Therapies   Start Date: 12/19/2020  Expected End Date: 03/19/2021  This Visit's Progress: On track  Priority: High  Note:   Current Barriers:  Does not remember to take all of her medications  Pharmacist Clinical Goal(s):  patient will adhere to prescribed medication regimen as evidenced by timely refill of medications  through collaboration with PharmD and provider.    Interventions: Inter-disciplinary care team collaboration (see longitudinal plan of care) Comprehensive medication review performed; medication list updated in electronic medical record  Diabetes  Controlled; current treatment: Humulin N 12 units at bedtime; Novolin R 12 units TID with meals ;   Current glucose readings: fasting glucose: 120's, post prandial glucose: 160's  Denies hypoglycemic/hyperglycemic symptoms  Current meal patterns: breakfast: skips due to sleeping; Lunch: soup, sandwich; Dinner: soups/stews; Drinks: Sweet tea (monk fruit sugar), water  Recommended patient continue to check blood glucose 4x/day  Hypertension:  Controlled; current treatment: metoprolol succinate 25mg   Current home readings: patient reports she has not checked her blood pressure at home in quite some time due to frequent monitoring at all her other  appointments  Denies/hypotensive/hypertensive symptoms  Recommended patient check her blood pressure from time to time at home  Hyperlipidemia:  Uncontrolled current treatment: rosuvastatin 40mg ;   Current dietary patterns: see above  Collaborated with PCP to recommend ezetimibe  Pain (back)     Controlled; current treatment: diclofenac 75mg  BID prn (usually takes once daily), Norco 5-325mg  (not taking)     Rates pain as "mild" besides today which she states she is in slightly more pain than usual due to Fulvestrant injection yesterday  Neuropathy    Controlled; patient not currently taking gabapentin    Patient states since completed radiation she has not had any more  neuropathy pain    Patient Goals/Self-Care Activities patient will:  - focus on medication adherence by working on solutions to help remember to take medications (patient already using pill organizer by her bed and gave option of setting alarms) check glucose 4x/day, document, and provide at future appointments  Follow Up Plan:  Telephone follow up appointment with care management team member scheduled for: 11[/2/22 Care manager; 03/13/21 PharmD Next PCP appointment scheduled for: 05/12/21

## 2020-12-21 ENCOUNTER — Other Ambulatory Visit: Payer: Self-pay | Admitting: Internal Medicine

## 2020-12-21 DIAGNOSIS — E119 Type 2 diabetes mellitus without complications: Secondary | ICD-10-CM

## 2020-12-21 DIAGNOSIS — Z794 Long term (current) use of insulin: Secondary | ICD-10-CM

## 2020-12-23 ENCOUNTER — Other Ambulatory Visit (HOSPITAL_COMMUNITY): Payer: Self-pay

## 2020-12-24 ENCOUNTER — Other Ambulatory Visit: Payer: Self-pay

## 2020-12-24 ENCOUNTER — Other Ambulatory Visit: Payer: Self-pay | Admitting: *Deleted

## 2020-12-24 NOTE — Patient Outreach (Signed)
Medicaid Managed Care   Nurse Care Manager Note  12/24/2020 Name:  Emily Wilson MRN:  [id number] DOB:  1965-11-18  Emily Wilson is an 55 y.o. year old female who is a primary patient of Dorna Mai, MD.  The [facility] Managed Care Coordination team was consulted for assistance with:    Metastatic breast cancer DMII  [name] was given information about Medicaid Managed Care Coordination team services today. Emily Wilson Patient agreed to services and verbal consent obtained.  Engaged with patient by telephone for follow up visit in response to provider referral for case management and/or care coordination services.   Assessments/Interventions:  Review of past medical history, allergies, medications, health status, including review of consultants reports, laboratory and other test data, was performed as part of comprehensive evaluation and provision of chronic care management services.  SDOH (Social Determinants of Health) assessments and interventions performed:   Care Plan  Allergies  Allergen Reactions   Doxycycline Shortness Of Breath    Wheezing, shortness of breath, rash head to toe, and swelling   Naproxen Shortness Of Breath   Codeine Hives   Hydrocodone-Acetaminophen Hives   Ibuprofen     Irritant to stomach r/t diverticulitis   Lantus [Insulin Glargine]     Yeast Infections   Meloxicam Other (See Comments)    Abdominal pain    Propoxyphene N-Acetaminophen Hives   Sulfonamide Derivatives Hives   Tramadol Other (See Comments)    Abdominal Pain    Medications Reviewed Today     Reviewed by [name], RN (Registered Nurse) on 12/24/20 at [phone]  Med List Status: <None>   Medication Order Taking? Sig Documenting Provider Last Dose Status Informant  Accu-Chek FastClix Lancets MISC [id number] No USE TO TEST BLOOD SUGAR UP TO FOUR TIMES DAILY AS DIRECTED [name], MD Taking Active   BD INSULIN SYRINGE U/F 31G X 5/16" 0.5 ML MISC  [id number] No USE TO ADMINISTER INSULIN 3 TIMES DAILY WITH MEALS [name], MD Taking Active   diclofenac (VOLTAREN) 75 MG EC tablet [id number] No Take 75 mg by mouth 2 (two) times daily as needed. [provider] Taking Active            Med Note Georgina Peer, RACHELLE   Fri Dec 19, 2020 10:06 AM) Taking once daily  gabapentin (NEURONTIN) 100 MG capsule [id number] No Take 3 capsules (300 mg total) by mouth 3 (three) times daily.  Patient not taking: Reported on 12/19/2020   [name], MD Not Taking Active            Med Note [name], Susette Seminara A   Mon Nov 24, 2020  9:28 AM) Taking as needed  glucose blood (ACCU-CHEK GUIDE) test strip [id number] No USE TO CHECK BLOOD SUGAR UP TO 4 TIMES A DAY. DX: E11.65 [name], MD Taking Active   HYDROcodone-acetaminophen (NORCO/VICODIN) 5-325 MG tablet [id number] No Take 1 tablet by mouth 2 (two) times daily as needed.  Patient not taking: Reported on 12/19/2020   [provider] Not Taking Active   insulin NPH Human (HUMULIN N) 100 UNIT/ML injection [id number] No INJECT 12 UNITS INTO THE SKIN AT BEDTIME [name], MD Taking Active            Med Note [name], Crayton Savarese A   Mon Nov 24, 2020  9:26 AM) Not taking due to decreased appetite  insulin regular (NOVOLIN R) 100 units/mL injection [id number] No Inject 0.12 mLs (12 Units total) into the  skin 3 (three) times daily before meals. E.11.9. Hold dose for blood sugar less than 125 Dorna Mai, MD Taking Active   letrozole [facility]) 2.5 MG tablet [id number] No TAKE 1 TABLET BY MOUTH EVERY DAY [name], MD Taking Active   metoprolol succinate (TOPROL-XL) 25 MG 24 hr tablet [id number]  Take 1 tablet (25 mg total) by mouth daily. Please ask pt to call and make appt with provider for additional refills - 1st attempt [name], MD  Active   Milk Thistle 1000 MG CAPS [id number] No Take 1,000 mg by mouth daily.  Patient not taking: No sig reported   [provider] Not Taking Active   Multiple Vitamin (MULTIVITAMIN WITH MINERALS) TABS tablet [id number] No Take 1 tablet by mouth daily. [provider] Taking Active Self  ondansetron (ZOFRAN ODT) 8 MG disintegrating tablet [id number] No Take 1 tablet (8 mg total) by mouth every 8 (eight) hours as needed for nausea or vomiting.  Patient not taking: Reported on 12/19/2020   Kyung Rudd, MD Not Taking Active   palbociclib [facility]) 125 MG tablet [id number] No TAKE 1 TABLET (125 MG TOTAL) BY MOUTH DAILY. TAKE FOR 21 DAYS ON, 7 DAYS OFF, REPEAT EVERY 28 DAYS. [name], MD Taking Active   rosuvastatin (CRESTOR) 40 MG tablet [id number] No Take 1 tablet (40 mg total) by mouth daily. [name], MD Taking Active   Vitamin D, Ergocalciferol, (DRISDOL) 1.25 MG (50000 UNIT) CAPS capsule [id number] No Take 1 capsule by mouth once a week.  Patient not taking: Reported on 12/19/2020   [name], MD Not Taking Active   Med List Note [name], [facility] 10/02/18 1151): Ibrance filled out [place]            Patient Active Problem List   Diagnosis Date Noted   History of shingles 01/09/2019   S/P mastectomy, right 11/16/2018   Bone metastasis ([place]) 05/31/2018   Family history of breast cancer in female 03/13/2015   Malignant neoplasm of lower-inner quadrant of right breast of female, estrogen receptor positive ([place]) 02/25/2015   DM (diabetes mellitus), type 2 ([place]) 02/25/2015   HLD (hyperlipidemia) 02/25/2015   Palpitations 01/26/2010   Anxiety state 05/26/2009   Hyperthyroidism 11/14/2006   Hypertension 11/14/2006    Conditions to be addressed/monitored per PCP order:  DMII and metastatic breast cancer  Care Plan : Diabetes Management  Updates made by [name], RN since 12/24/2020 12:00 AM  Completed 12/24/2020   Problem: Glycemic Management (Diabetes, Type 2) Resolved 12/24/2020     Long-Range Goal: Glycemic Management  Optimized Completed 12/24/2020  Start Date: 01/16/2020  Expected End Date: 12/24/2020  Recent Progress: On track  Priority: High  Note:    Current Barriers:  Chronic Disease Management support and education needs related to Diabetes . Ms Doughman manages her diabetes by eating a healthy, plant based diet, checking blood sugars 3-4 times daily, attending routine screening appointments, and walking on days when the weather is nice. Ms. Hitzeman was + Covid in February, feeling better, continues to have mild cough and runny nose. She is experiencing muscle weakness due to [place]. Plans to purchase a device to help open jars when she gets paid. Recent A1C 7.7, admits to liking Reece cups, otherwise adheres to a healthy diet. Denies any needs at this time. She is getting exercise by working in her garden and enjoys eating the fresh vegetables. PCP recently increased insulin dose and she feels  like her readings are improved. Ms. Kozub is experiencing sciatica pain and is concerned her cancer has metastasized. She has an imaging study on 9/19 and plans to notify Dr. Lindi Adie for appropriate plan of care. She also expresses concern regarding her insurance change to Medicare and [place] Access in which she did not request. Ms. Meldrum is undergoing radiation therapy for bone mets to femur and spine. She would like to continue working with [facility] for health management. She had improvement in her A1C to 6.6, and is concerned that recent insulin change will affect her readings. Due to decreased appetite she has stopped taking long acting insulin. She drives herself to all appointments, but has a friend that will help her if needed. She has established care with new PCP.-Update-Ms. Hindle has completed radiation and feeling very tired. She denies any needs at this time. Nurse Case Manager Clinical Goal(s):  patient will meet with RN Care Manager to address barriers to managing medical care-Met-Ms Lopezperez reports not  barriers at this time the patient will demonstrate ongoing self health care management ability as evidenced by improving A1C Interventions:  Inter-disciplinary care team collaboration (see longitudinal plan of care) Evaluation of current treatment plan related to Diabetes and patient's adherence to plan as established by provider. Discussed plans with patient for ongoing care management follow up and provided patient with direct contact information for care management team Reviewed scheduled/upcoming provider appointments Advised patient to contact her provider or Medicare with any future healthcare management needs Advised patient, providing education and rationale, to check cbg as directed by PCP and record, calling PCP for findings outside established parameters.   Provided therapeutic listening Patient Goals/Self-Care Activities - Self administers medications as prescribed - Attends all scheduled provider appointments - Calls pharmacy for medication refills - Calls provider office for new concerns or questions - check blood sugar at prescribed times - check blood sugar if I feel it is too high or too low - enter blood sugar readings and medication or insulin into daily log - take the blood sugar log to all doctor visits - take the blood sugar meter to all doctor visits -continue your diet of plant based food choices, limiting carbohydrates and sugars -exercise daily as tolerated -limit Reece cup intake, increase protein intake  Follow Up Plan: Telephone follow up appointment with Managed Medicaid care management team member scheduled for:no follow up scheduled. Advised patient to contact provider with any future healthcare management needs.       Follow Up:  Patient agrees to Care Plan and Follow-up.  Plan:Ms. Forni will contact her providers office or Medicare provider with future barriers to managing her health.  Date/time of next scheduled RN care management/care  coordination outreach:  N/A  Lurena Joiner RN, [place] RN Care Coordinator

## 2020-12-24 NOTE — Patient Instructions (Signed)
Visit Information  Emily Wilson was given information about Medicaid Managed Care team care coordination services and verbally consented to engagement with the [facility] Managed Care team.    Please see education materials related to fatigue provided as print materials.   The patient verbalized understanding of instructions provided today and agreed to receive a mailed copy of patient instruction and/or educational materials.  Please contact your provider with questions, concerns, or barriers to managing your healthcare.  Emily Montane, RN  Following is a copy of your plan of care:  Patient Care Plan: Diabetes Management  Completed 12/24/2020   Problem Identified: Glycemic Management (Diabetes, Type 2) Resolved 12/24/2020     Long-Range Goal: Glycemic Management Optimized Completed 12/24/2020  Start Date: 01/16/2020  Expected End Date: 12/24/2020  Recent Progress: On track  Priority: High  Note:    Current Barriers:  Chronic Disease Management support and education needs related to Diabetes . Ms Emily Wilson manages her diabetes by eating a healthy, plant based diet, checking blood sugars 3-4 times daily, attending routine screening appointments, and walking on days when the weather is nice. [name] was + Covid in February, feeling better, continues to have mild cough and runny nose. She is experiencing muscle weakness due to [place]. Plans to purchase a device to help open jars when she gets paid. Recent A1C 7.7, admits to liking Reece cups, otherwise adheres to a healthy diet. Denies any needs at this time. She is getting exercise by working in her garden and enjoys eating the fresh vegetables. PCP recently increased insulin dose and she feels like her readings are improved. Emily Wilson is experiencing sciatica pain and is concerned her cancer has metastasized. She has an imaging study on 9/19 and plans to notify Dr. Lindi Adie for appropriate plan of care. She also expresses concern regarding  her insurance change to Medicare and [place] Access in which she did not request. 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 She would like to continue working with [facility] for health management. She had improvement in her A1C to 6.6, and is concerned that recent insulin change will affect her readings. Due to decreased appetite she has stopped taking long acting insulin. She drives herself to all appointments, but has a friend that will help her if needed. She has established care with new PCP.-Update-Emily Wilson has completed radiation and feeling very tired. She denies any needs at this time. Nurse Case Manager Clinical Goal(s):  patient will meet with RN Care Manager to address barriers to managing medical care-Met-Ms Wilson reports not barriers at this time the patient will demonstrate ongoing self health care management ability as evidenced by improving A1C Interventions:  Inter-disciplinary care team collaboration (see longitudinal plan of care) Evaluation of current treatment plan related to Diabetes and patient's adherence to plan as established by provider. Discussed plans with patient for ongoing care management follow up and provided patient with direct contact information for care management team Reviewed scheduled/upcoming provider appointments Advised patient to contact her provider or Medicare with any future healthcare management needs Advised patient, providing education and rationale, to check cbg as directed by PCP and record, calling PCP for findings outside established parameters.   Provided therapeutic listening Patient Goals/Self-Care Activities - Self administers medications as prescribed - Attends all scheduled provider appointments - Calls pharmacy for medication refills - Calls provider office for new concerns or questions - check blood sugar at prescribed times - check blood sugar if I feel  it is too high or too low - enter blood sugar  readings and medication or insulin into daily log - take the blood sugar log to all doctor visits - take the blood sugar meter to all doctor visits -continue your diet of plant based food choices, limiting carbohydrates and sugars -exercise daily as tolerated -limit Reece cup intake, increase protein intake  Follow Up Plan: Telephone follow up appointment with Managed Medicaid care management team member scheduled for:no follow up scheduled. Advised patient to contact provider with any future healthcare management needs.      Patient Care Plan: Medication Management  Completed 12/19/2020   Problem Identified: Health Promotion or Disease Self-Management (General Plan of Care) Resolved 12/19/2020     Goal: Medication Management Completed 12/19/2020  Note:   Current Barriers:  Does not contact provider office for questions/concerns   Pharmacist Clinical Goal(s):  Over the next 90 days, patient will contact provider office for questions/concerns as evidenced notation of same in electronic health record through collaboration with PharmD and provider.    Interventions: Inter-disciplinary care team collaboration (see longitudinal plan of care) Comprehensive medication review performed; medication list updated in electronic medical record    Patient Goals/Self-Care Activities Over the next 90 days, patient will:  - collaborate with provider on medication access solutions  Follow Up Plan: The care management team will reach out to the patient again over the next 90 days.       Patient Care Plan: General Pharmacy (Adult)     Problem Identified: Chronic Disease Management   Priority: High     Goal: Managing Chronic Disease Therapies   Start Date: 12/19/2020  Expected End Date: 03/19/2021  This Visit's Progress: On track  Priority: High  Note:   Current Barriers:  Does not remember to take all of her medications  Pharmacist Clinical Goal(s):  patient will adhere to  prescribed medication regimen as evidenced by timely refill of medications  through collaboration with PharmD and provider.    Interventions: Inter-disciplinary care team collaboration (see longitudinal plan of care) Comprehensive medication review performed; medication list updated in electronic medical record  Diabetes  Controlled; current treatment: Humulin N 12 units at bedtime; Novolin R 12 units TID with meals ;   Current glucose readings: fasting glucose: 120's, post prandial glucose: 160's  Denies hypoglycemic/hyperglycemic symptoms  Current meal patterns: breakfast: skips due to sleeping; Lunch: soup, sandwich; Dinner: soups/stews; Drinks: Sweet tea (monk fruit sugar), water  Recommended patient continue to check blood glucose 4x/day  Hypertension:  Controlled; current treatment: metoprolol succinate 20m  Current home readings: patient reports she has not checked her blood pressure at home in quite some time due to frequent monitoring at all her other  appointments  Denies/hypotensive/hypertensive symptoms  Recommended patient check her blood pressure from time to time at home  Hyperlipidemia:  Uncontrolled current treatment: rosuvastatin 42m   Current dietary patterns: see above  Collaborated with PCP to recommend ezetimibe  Pain (back)     Controlled; current treatment: diclofenac 7544mID prn (usually takes once daily), Norco 5-325m71mot taking)     Rates pain as "mild" besides today which she states she is in slightly more pain than usual due to Fulvestrant injection yesterday  Neuropathy    Controlled; patient not currently taking gabapentin    Patient states since completed radiation she has not had any more neuropathy pain    Patient Goals/Self-Care Activities patient will:  - focus on medication adherence by working on solutions to  help remember to take medications (patient already using pill organizer by her bed and gave option of setting alarms) check  glucose 4x/day, document, and provide at future appointments  Follow Up Plan:  Telephone follow up appointment with care management team member scheduled for: 11[/2/22 Care manager; 03/13/21 PharmD Next PCP appointment scheduled for: 05/12/21

## 2021-01-07 ENCOUNTER — Encounter: Payer: Self-pay | Admitting: Radiation Oncology

## 2021-01-07 ENCOUNTER — Ambulatory Visit
Admission: RE | Admit: 2021-01-07 | Discharge: 2021-01-07 | Disposition: A | Discharge status: Home / Self Care | Payer: Medicare Other | Source: Ambulatory Visit | Attending: Radiation Oncology | Admitting: Radiation Oncology

## 2021-01-07 ENCOUNTER — Other Ambulatory Visit: Payer: Self-pay

## 2021-01-07 ENCOUNTER — Other Ambulatory Visit: Payer: Self-pay | Admitting: Internal Medicine

## 2021-01-07 VITALS — BP 110/93 | HR 107 | Temp 97.6°F | Resp 18 | Ht 59.0 in | Wt 149.5 lb

## 2021-01-07 DIAGNOSIS — C7951 Secondary malignant neoplasm of bone: Secondary | ICD-10-CM | POA: Diagnosis present

## 2021-01-07 DIAGNOSIS — Z79899 Other long term (current) drug therapy: Secondary | ICD-10-CM | POA: Insufficient documentation

## 2021-01-07 DIAGNOSIS — Z923 Personal history of irradiation: Secondary | ICD-10-CM | POA: Diagnosis not present

## 2021-01-07 DIAGNOSIS — E119 Type 2 diabetes mellitus without complications: Secondary | ICD-10-CM

## 2021-01-07 DIAGNOSIS — C50311 Malignant neoplasm of lower-inner quadrant of right female breast: Secondary | ICD-10-CM | POA: Insufficient documentation

## 2021-01-07 NOTE — Progress Notes (Signed)
Mrs. Keng presents today for follow-up after completing radiation to her spine and left femur  Reports she's finally getting her energy back. States she experienced some sternal/lower esophagus discomfort for about 1.5 weeks after she finished treatment, but issue has since resolved. Denies any worsening/progression of pain or mobility issues. Denies any changes in bowel or bladder habits  Wt Readings from Last 3 Encounters:  01/07/21 149 lb 8 oz (67.8 kg)  11/18/20 154 lb 9.6 oz (70.1 kg)  11/13/20 152 lb (68.9 kg)

## 2021-01-07 NOTE — Progress Notes (Signed)
Radiation Oncology         [phone]) [phone] ________________________________  Name: Emily Wilson MRN: [id number]  Date: 01/07/2021  DOB: 10-17-1965  Follow-Up Visit Note  Outpatient  CC: Dorna Mai, MD  [name], MD  Diagnosis and Prior Radiotherapy:    ICD-10-CM   1. 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  Mrs. Emily Wilson presents today for follow-up after completing radiation to her spine and left femur - reports excellent improvement in pain  Reports she's finally getting her energy back. States she experienced some sternal/lower esophagus discomfort for about 1.5 weeks after she finished treatment, but issue has since resolved. Denies any worsening/progression of pain or mobility issues. Denies any changes in bowel or bladder habits or new bone pain.  Wt Readings from Last 3 Encounters:  01/07/21 149 lb 8 oz (67.8 kg)  11/18/20 154 lb 9.6 oz (70.1 kg)  11/13/20 152 lb (68.9 kg)                              ALLERGIES:  is allergic to doxycycline, naproxen, codeine, hydrocodone-acetaminophen, ibuprofen, lantus [insulin glargine], meloxicam, propoxyphene n-acetaminophen, sulfonamide derivatives, and tramadol.  Meds: Current Outpatient Medications  Medication Sig Dispense Refill   Accu-Chek FastClix Lancets MISC USE TO TEST BLOOD SUGAR UP TO FOUR TIMES DAILY AS DIRECTED 204 each 2   ACCU-CHEK GUIDE test strip USE TO CHECK BLOOD SUGAR UP TO 4 TIMES A DAY. DX: E11.65 200 strip 2   BD INSULIN SYRINGE U/F 31G X 5/16" 0.5 ML MISC USE TO ADMINISTER INSULIN 3 TIMES DAILY WITH MEALS 100 each 3   diclofenac (VOLTAREN) 75 MG EC tablet Take 75 mg by mouth 2 (two) times daily as needed.     gabapentin (NEURONTIN) 100 MG capsule Take 3 capsules (300 mg total) by mouth 3 (three) times daily. (Patient not taking: Reported on 12/19/2020) 90  capsule 1   HYDROcodone-acetaminophen (NORCO/VICODIN) 5-325 MG tablet Take 1 tablet by mouth 2 (two) times daily as needed. (Patient not taking: Reported on 12/19/2020)     insulin NPH Human (HUMULIN N) 100 UNIT/ML injection INJECT 12 UNITS INTO THE SKIN AT BEDTIME 10 mL 1   insulin regular (NOVOLIN R) 100 units/mL injection Inject 0.12 mLs (12 Units total) into the skin 3 (three) times daily before meals. E.11.9. Hold dose for blood sugar less than 125 10 mL 11   letrozole (FEMARA) 2.5 MG tablet TAKE 1 TABLET BY MOUTH EVERY DAY 90 tablet 3   metoprolol succinate (TOPROL-XL) 25 MG 24 hr tablet Take 1 tablet (25 mg total) by mouth daily. Please ask pt to call and make appt with provider for additional refills - 1st attempt 30 tablet 0   Milk Thistle 1000 MG CAPS Take 1,000 mg by mouth daily. (Patient not taking: No sig reported)     Multiple Vitamin (MULTIVITAMIN WITH MINERALS) TABS tablet Take 1 tablet by mouth daily.     ondansetron (ZOFRAN ODT) 8 MG disintegrating tablet Take 1 tablet (8 mg total) by mouth every 8 (eight) hours as needed for nausea or vomiting. (Patient not taking: Reported on 12/19/2020) 20 tablet 1   palbociclib (IBRANCE) 125 MG tablet TAKE 1 TABLET (125 MG TOTAL) BY MOUTH DAILY. TAKE FOR 21 DAYS ON, 7 DAYS OFF, REPEAT EVERY 28  DAYS. 21 tablet 3   rosuvastatin (CRESTOR) 40 MG tablet Take 1 tablet (40 mg total) by mouth daily. 90 tablet 3   Vitamin D, Ergocalciferol, (DRISDOL) 1.25 MG (50000 UNIT) CAPS capsule Take 1 capsule by mouth once a week. (Patient not taking: Reported on 12/19/2020)     No current facility-administered medications for this encounter.    Physical Findings: The patient is in no acute distress. Patient is alert and oriented.  height is 4\' 11"  (1.499 m) and weight is 149 lb 8 oz (67.8 kg). Her temporal temperature is 97.6 F (36.4 C). Her blood pressure is 110/93 (abnormal) and her pulse is 107 (abnormal). Her respiration is 18 and oxygen saturation is 99%.  Marland Kitchen    HEART RRR CHEST CTAB MSK: ambulatory   Lab Findings: Lab Results  Component Value Date   WBC 6.8 10/28/2020   HGB 13.8 10/28/2020   HCT 40.9 10/28/2020   MCV 94.5 10/28/2020   PLT 331 10/28/2020    Radiographic Findings: No results found.  Impression/Plan:  Excellent response to palliative RT.  I will see her back PRN.  She will continue to follow with medical oncology.  On date of service, in total, I spent 20 minutes on this encounter. Patient was seen in person.  _____________________________________   Eppie Gibson, MD

## 2021-01-14 ENCOUNTER — Inpatient Hospital Stay: Payer: Medicare Other | Attending: Medical

## 2021-01-14 ENCOUNTER — Other Ambulatory Visit: Payer: Self-pay

## 2021-01-14 ENCOUNTER — Other Ambulatory Visit: Payer: Self-pay | Admitting: Hematology and Oncology

## 2021-01-14 ENCOUNTER — Encounter: Payer: Self-pay | Admitting: Hematology and Oncology

## 2021-01-14 ENCOUNTER — Other Ambulatory Visit (HOSPITAL_COMMUNITY): Payer: Self-pay

## 2021-01-14 VITALS — BP 118/89 | HR 94 | Temp 98.0°F | Resp 18

## 2021-01-14 DIAGNOSIS — C7951 Secondary malignant neoplasm of bone: Secondary | ICD-10-CM | POA: Diagnosis present

## 2021-01-14 DIAGNOSIS — Z923 Personal history of irradiation: Secondary | ICD-10-CM | POA: Insufficient documentation

## 2021-01-14 DIAGNOSIS — C50311 Malignant neoplasm of lower-inner quadrant of right female breast: Secondary | ICD-10-CM | POA: Diagnosis present

## 2021-01-14 DIAGNOSIS — Z170 Estrogen receptor positive status [ER+]: Secondary | ICD-10-CM | POA: Insufficient documentation

## 2021-01-14 DIAGNOSIS — Z9011 Acquired absence of right breast and nipple: Secondary | ICD-10-CM | POA: Diagnosis not present

## 2021-01-14 MED ORDER — PALBOCICLIB 125 MG PO TABS
ORAL_TABLET | ORAL | 3 refills | Status: DC
  Filled 2021-01-14: qty 21, 28d supply, fill #0
  Filled 2021-02-12: qty 21, 28d supply, fill #1
  Filled 2021-03-12: qty 21, 28d supply, fill #2

## 2021-01-14 MED ORDER — FULVESTRANT 250 MG/5ML IM SOSY
500.0000 mg | PREFILLED_SYRINGE | Freq: Once | INTRAMUSCULAR | Status: AC
  Administered 2021-01-14: 500 mg via INTRAMUSCULAR
  Filled 2021-01-14: qty 10

## 2021-01-14 NOTE — Progress Notes (Signed)
Pt to get labs and Xgeva Injection next week since she did not have a lab appt and is a week early. Pt prefers to come back next week also. Dr. Lindi Adie and scheduling aware.

## 2021-01-20 ENCOUNTER — Other Ambulatory Visit (HOSPITAL_COMMUNITY): Payer: Self-pay

## 2021-01-21 ENCOUNTER — Inpatient Hospital Stay: Payer: Medicare Other

## 2021-01-21 ENCOUNTER — Other Ambulatory Visit: Payer: Self-pay

## 2021-01-21 VITALS — BP 113/76 | HR 92 | Temp 99.2°F | Resp 18

## 2021-01-21 DIAGNOSIS — C50311 Malignant neoplasm of lower-inner quadrant of right female breast: Secondary | ICD-10-CM

## 2021-01-21 DIAGNOSIS — C7951 Secondary malignant neoplasm of bone: Secondary | ICD-10-CM | POA: Diagnosis not present

## 2021-01-21 DIAGNOSIS — Z170 Estrogen receptor positive status [ER+]: Secondary | ICD-10-CM

## 2021-01-21 LAB — CMP (CANCER CENTER ONLY)
ALT: 16 U/L (ref 0–44)
AST: 19 U/L (ref 15–41)
Albumin: 3.9 g/dL (ref 3.5–5.0)
Alkaline Phosphatase: 102 U/L (ref 38–126)
Anion gap: 8 (ref 5–15)
BUN: 17 mg/dL (ref 6–20)
CO2: 24 mmol/L (ref 22–32)
Calcium: 9.1 mg/dL (ref 8.9–10.3)
Chloride: 105 mmol/L (ref 98–111)
Creatinine: 0.58 mg/dL (ref 0.44–1.00)
GFR, Estimated: >60 mL/min (ref >60)
Glucose, Bld: 111 mg/dL — ABNORMAL HIGH (ref 70–99)
Potassium: 3.9 mmol/L (ref 3.5–5.1)
Sodium: 137 mmol/L (ref 135–145)
Total Bilirubin: 0.4 mg/dL (ref 0.3–1.2)
Total Protein: 7.4 g/dL (ref 6.5–8.1)

## 2021-01-21 LAB — CBC WITH DIFFERENTIAL (CANCER CENTER ONLY)
Abs Immature Granulocytes: 0.03 10*3/uL (ref 0.00–0.07)
Basophils Absolute: 0.1 10*3/uL (ref 0.0–0.1)
Basophils Relative: 1 %
Eosinophils Absolute: 0.2 10*3/uL (ref 0.0–0.5)
Eosinophils Relative: 4 %
HCT: 42.3 % (ref 36.0–46.0)
Hemoglobin: 14.3 g/dL (ref 12.0–15.0)
Immature Granulocytes: 1 %
Lymphocytes Relative: 13 %
Lymphs Abs: 0.7 10*3/uL (ref 0.7–4.0)
MCH: 31.8 pg (ref 26.0–34.0)
MCHC: 33.8 g/dL (ref 30.0–36.0)
MCV: 94 fL (ref 80.0–100.0)
Monocytes Absolute: 0.7 10*3/uL (ref 0.1–1.0)
Monocytes Relative: 13 %
Neutro Abs: 3.3 10*3/uL (ref 1.7–7.7)
Neutrophils Relative %: 68 %
Platelet Count: 245 10*3/uL (ref 150–400)
RBC: 4.5 MIL/uL (ref 3.87–5.11)
RDW: 13.2 % (ref 11.5–15.5)
WBC Count: 4.9 10*3/uL (ref 4.0–10.5)
nRBC: 0 % (ref 0.0–0.2)

## 2021-01-21 MED ORDER — DENOSUMAB 120 MG/1.7ML ~~LOC~~ SOLN
120.0000 mg | Freq: Once | SUBCUTANEOUS | Status: AC
  Administered 2021-01-21: 120 mg via SUBCUTANEOUS
  Filled 2021-01-21: qty 1.7

## 2021-01-26 ENCOUNTER — Encounter: Payer: Self-pay | Admitting: Hematology and Oncology

## 2021-01-26 NOTE — Progress Notes (Signed)
                                                                                                                                                             Patient Name: Emily Wilson MRN: [id number] DOB: 1965/04/09 Referring Physician: [name] (Profile Not Attached) Date of Service: 12/02/2020 [facility], [place]                                                        End Of Treatment Note  Diagnoses: C79.51-Secondary malignant neoplasm of bone  Cancer Staging:  Cancer Staging  Malignant neoplasm of lower-inner quadrant of right breast of female, estrogen receptor positive ([place]) Staging form: Breast, AJCC 7th Edition - Pathologic stage from 12/07/2018: Stage IV (yT4b, NX, M1) - Signed by Eppie Gibson, MD on 12/07/2018 Stage prefix: Post-therapy Laterality: Right Tumor grade (Scarff-Bloom-Richardson system): G1 Estrogen receptor status: Positive Progesterone receptor status: Negative HER2 status: Negative   Intent: Palliative  Radiation Treatment Dates: 11/19/2020 through 12/02/2020 Site Technique Total Dose (Gy) Dose per Fx (Gy) Completed Fx Beam Energies  Thoracic Spine: Spine_ T8-T11 3D 30/30 3 10/10 10X, 15X  Femur Left: Ext_Lt_femur Complex 30/30 3 10/10 10X  Lumbar Spine: Spine_L4-S4 3D 30/30 3 10/10 10X, 15X   Narrative: The patient tolerated radiation therapy relatively well.   Plan: The patient will follow-up with radiation oncology in 39moand / or PRN. -----------------------------------  SEppie Gibson MD

## 2021-01-28 ENCOUNTER — Other Ambulatory Visit: Payer: Self-pay

## 2021-01-28 ENCOUNTER — Encounter: Payer: Self-pay | Admitting: Family Medicine

## 2021-01-28 ENCOUNTER — Ambulatory Visit (INDEPENDENT_AMBULATORY_CARE_PROVIDER_SITE_OTHER): Payer: Medicare Other | Admitting: Family Medicine

## 2021-01-28 VITALS — BP 115/78 | HR 79 | Temp 98.2°F | Resp 16 | Wt 150.4 lb

## 2021-01-28 DIAGNOSIS — Z794 Long term (current) use of insulin: Secondary | ICD-10-CM

## 2021-01-28 DIAGNOSIS — I10 Essential (primary) hypertension: Secondary | ICD-10-CM | POA: Diagnosis not present

## 2021-01-28 DIAGNOSIS — E1159 Type 2 diabetes mellitus with other circulatory complications: Secondary | ICD-10-CM

## 2021-01-28 LAB — POCT GLYCOSYLATED HEMOGLOBIN (HGB A1C): Hemoglobin A1C: 6.4 % — AB (ref 4.0–5.6)

## 2021-01-28 MED ORDER — INSULIN NPH (HUMAN) (ISOPHANE) 100 UNIT/ML ~~LOC~~ SUSP: SUBCUTANEOUS | 11 refills | Status: DC

## 2021-01-28 NOTE — Progress Notes (Signed)
Patient is here for medication refill on Novolin N

## 2021-01-29 ENCOUNTER — Encounter: Payer: Self-pay | Admitting: Family Medicine

## 2021-01-29 NOTE — Progress Notes (Signed)
Established Patient Office Visit  Subjective:  Patient ID: Emily Wilson, female    DOB: 1965-05-02  Age: 55 y.o. MRN: [id number]  CC:  Chief Complaint  Patient presents with   Medication Refill   Diabetes    HPI TAMIE MINTEER presents for follow up of diabetes and hypertension. Patient denies acute complaints.   Past Medical History:  Diagnosis Date   Anxiety state, unspecified    Arthritis    Breast cancer of lower-inner quadrant of right female breast [facility])    Carpal tunnel syndrome    Complication of anesthesia    woke up during ganglion cyst removal in her 20's, not woken up since   Diabetes mellitus without complication (HCC)    Type II   Heart murmur    History of radiation therapy 12/18/18- 01/30/19   Right Chest wall 25 fractions X 2Gy each to total 50 Gy, followed by a boost 10 Gy in 5 fractions.    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, without mention of thyrotoxic crisis or storm     Past Surgical History:  Procedure Laterality Date   ABDOMINAL HYSTERECTOMY     APPENDECTOMY     CARPAL TUNNEL RELEASE     GANGLION CYST EXCISION Right    KNEE SURGERY Right 2007   ACL repair   TOTAL MASTECTOMY Right 11/16/2018   Procedure: RIGHT MASTECTOMY;  Surgeon: Coralie Keens, MD;  Location: [place];  Service: General;  Laterality: Right;   TUBAL LIGATION      Family History  Problem Relation Age of Onset   Diabetes Mother    Bladder Cancer Mother 47       smoker   Other Mother 76       TAH for unspecified reason   Multiple sclerosis Mother    Cancer Father 61       lymphatic/tonsil cancer - in remission; former smoker   COPD Maternal Grandmother        smoker   Emphysema Maternal Grandmother        smoker   Stroke Maternal Grandfather    Dementia Maternal Uncle    Diabetes Paternal Grandmother    COPD Maternal Uncle        smoker    Multiple sclerosis Paternal Aunt    Breast cancer Paternal Aunt        dx. late 57s - early 66s    Social History   Socioeconomic History   Marital status: Divorced    Spouse name: Not on file   Number of children: Not on file   Years of education: Not on file   Highest education level: Not on file  Occupational History   Occupation: Scientist, water quality  Tobacco Use   Smoking status: Former    Packs/day: 3.00    Years: 30.00    Pack years: 90.00    Types: Cigarettes    Quit date: 10/24/2011    Years since quitting: 9.2   Smokeless tobacco: Never  Vaping Use   Vaping Use: Never used  Substance and Sexual Activity   Alcohol use: No   Drug use: Never    Comment: updated as of 09/06/2018 - pt reports no illegal drug use    Sexual activity: Not Currently  Other Topics Concern   Not on file  Social History Narrative   In LTR w/boyfriend  Works Chiropractor, TEFL teacher of Radio broadcast assistant Strain: Not on [employer] Insecurity: Not on file  Transportation Needs: No Transportation Needs   Lack of Transportation (Medical): No   Lack of Transportation (Non-Medical): No  Physical Activity: Not on file  Stress: Not on file  Social Connections: Not on file  Intimate Partner Violence: Not on file    ROS Review of Systems  All other systems reviewed and are negative.  Objective:   Today's Vitals: BP 115/78   Pulse 79   Temp 98.2 F (36.8 C) (Oral)   Resp 16   Wt 150 lb 6.4 oz (68.2 kg)   SpO2 96%   BMI 30.38 kg/m   Physical Exam Vitals and nursing note reviewed.  Constitutional:      General: She is not in acute distress. Neck:     Thyroid: No thyromegaly.  Cardiovascular:     Rate and Rhythm: Normal rate and regular rhythm.  Pulmonary:     Effort: Pulmonary effort is normal.     Breath sounds: Normal breath sounds.  Abdominal:     Palpations: Abdomen is soft.     Tenderness: There is no abdominal tenderness.  Musculoskeletal:     Cervical  back: Normal range of motion and neck supple.     Right lower leg: No edema.     Left lower leg: No edema.  Neurological:     General: No focal deficit present.     Mental Status: She is alert and oriented to person, place, and time.  Psychiatric:        Mood and Affect: Mood normal.        Behavior: Behavior normal.    Assessment & Plan:   1. Type 2 diabetes mellitus with other circulatory complication, with long-term current use of insulin (HCC) A1c at goal. Meds refilled. Continue present management.  - POCT glycosylated hemoglobin (Hb A1C)  2. Essential hypertension Appears stable. Continue present management.     Outpatient Encounter Medications as of 01/28/2021  Medication Sig   Accu-Chek FastClix Lancets MISC USE TO TEST BLOOD SUGAR UP TO FOUR TIMES DAILY AS DIRECTED   ACCU-CHEK GUIDE test strip USE TO CHECK BLOOD SUGAR UP TO 4 TIMES A DAY. DX: E11.65   BD INSULIN SYRINGE U/F 31G X 5/16" 0.5 ML MISC USE TO ADMINISTER INSULIN 3 TIMES DAILY WITH MEALS   diclofenac (VOLTAREN) 75 MG EC tablet Take 75 mg by mouth 2 (two) times daily as needed.   gabapentin (NEURONTIN) 100 MG capsule Take 3 capsules (300 mg total) by mouth 3 (three) times daily.   HYDROcodone-acetaminophen (NORCO/VICODIN) 5-325 MG tablet Take 1 tablet by mouth 2 (two) times daily as needed.   insulin NPH Human (HUMULIN N) 100 UNIT/ML injection INJECT 12 UNITS INTO THE SKIN AT BEDTIME   insulin NPH Human (NOVOLIN N) 100 UNIT/ML injection Inject 12 units SQ daily   insulin regular (NOVOLIN R) 100 units/mL injection Inject 0.12 mLs (12 Units total) into the skin 3 (three) times daily before meals. E.11.9. Hold dose for blood sugar less than 125   letrozole (FEMARA) 2.5 MG tablet TAKE 1 TABLET BY MOUTH EVERY DAY   metoprolol succinate (TOPROL-XL) 25 MG 24 hr tablet Take 1 tablet (25 mg total) by mouth daily. Please ask pt to call and make appt with provider for additional refills - 1st attempt   Multiple Vitamin  (MULTIVITAMIN WITH MINERALS) TABS tablet Take 1 tablet by mouth daily.  ondansetron (ZOFRAN ODT) 8 MG disintegrating tablet Take 1 tablet (8 mg total) by mouth every 8 (eight) hours as needed for nausea or vomiting.   palbociclib (IBRANCE) 125 MG tablet TAKE 1 TABLET (125 MG TOTAL) BY MOUTH DAILY. TAKE FOR 21 DAYS ON, 7 DAYS OFF, REPEAT EVERY 28 DAYS.   rosuvastatin (CRESTOR) 40 MG tablet Take 1 tablet (40 mg total) by mouth daily.   Milk Thistle 1000 MG CAPS Take 1,000 mg by mouth daily. (Patient not taking: Reported on 11/24/2020)   Vitamin D, Ergocalciferol, (DRISDOL) 1.25 MG (50000 UNIT) CAPS capsule Take 1 capsule by mouth once a week. (Patient not taking: Reported on 12/19/2020)   No facility-administered encounter medications on file as of 01/28/2021.    Follow-up: Return in about 6 months (around 07/29/2021).   Becky Sax, MD

## 2021-02-12 ENCOUNTER — Inpatient Hospital Stay: Payer: Medicare Other | Attending: Medical

## 2021-02-12 ENCOUNTER — Other Ambulatory Visit (HOSPITAL_COMMUNITY): Payer: Self-pay

## 2021-02-12 ENCOUNTER — Other Ambulatory Visit: Payer: Self-pay

## 2021-02-12 VITALS — BP 126/76 | HR 82 | Temp 98.4°F | Resp 18

## 2021-02-12 DIAGNOSIS — Z5111 Encounter for antineoplastic chemotherapy: Secondary | ICD-10-CM | POA: Insufficient documentation

## 2021-02-12 DIAGNOSIS — C50311 Malignant neoplasm of lower-inner quadrant of right female breast: Secondary | ICD-10-CM | POA: Insufficient documentation

## 2021-02-12 DIAGNOSIS — C7951 Secondary malignant neoplasm of bone: Secondary | ICD-10-CM | POA: Diagnosis present

## 2021-02-12 DIAGNOSIS — Z170 Estrogen receptor positive status [ER+]: Secondary | ICD-10-CM | POA: Insufficient documentation

## 2021-02-12 MED ORDER — FULVESTRANT 250 MG/5ML IM SOSY
500.0000 mg | PREFILLED_SYRINGE | Freq: Once | INTRAMUSCULAR | Status: AC
  Administered 2021-02-12: 12:00:00 500 mg via INTRAMUSCULAR
  Filled 2021-02-12: qty 10

## 2021-02-12 NOTE — Patient Instructions (Signed)
Fulvestrant injection °What is this medication? °FULVESTRANT (ful VES trant) blocks the effects of estrogen. It is used to treat breast cancer. °This medicine may be used for other purposes; ask your health care provider or pharmacist if you have questions. °COMMON BRAND NAME(S): FASLODEX °What should I tell my care team before I take this medication? °They need to know if you have any of these conditions: °bleeding disorders °liver disease °low blood counts, like low white cell, platelet, or red cell counts °an unusual or allergic reaction to fulvestrant, other medicines, foods, dyes, or preservatives °pregnant or trying to get pregnant °breast-feeding °How should I use this medication? °This medicine is for injection into a muscle. It is usually given by a health care professional in a hospital or clinic setting. °Talk to your pediatrician regarding the use of this medicine in children. Special care may be needed. °Overdosage: If you think you have taken too much of this medicine contact a poison control center or emergency room at once. °NOTE: This medicine is only for you. Do not share this medicine with others. °What if I miss a dose? °It is important not to miss your dose. Call your doctor or health care professional if you are unable to keep an appointment. °What may interact with this medication? °medicines that treat or prevent blood clots like warfarin, enoxaparin, dalteparin, apixaban, dabigatran, and rivaroxaban °This list may not describe all possible interactions. Give your health care provider a list of all the medicines, herbs, non-prescription drugs, or dietary supplements you use. Also tell them if you smoke, drink alcohol, or use illegal drugs. Some items may interact with your medicine. °What should I watch for while using this medication? °Your condition will be monitored carefully while you are receiving this medicine. You will need important blood work done while you are taking this  medicine. °Do not become pregnant while taking this medicine or for at least 1 year after stopping it. Women of child-bearing potential will need to have a negative pregnancy test before starting this medicine. Women should inform their doctor if they wish to become pregnant or think they might be pregnant. There is a potential for serious side effects to an unborn child. Men should inform their doctors if they wish to father a child. This medicine may lower sperm counts. Talk to your health care professional or pharmacist for more information. Do not breast-feed an infant while taking this medicine or for 1 year after the last dose. °What side effects may I notice from receiving this medication? °Side effects that you should report to your doctor or health care professional as soon as possible: °allergic reactions like skin rash, itching or hives, swelling of the face, lips, or tongue °feeling faint or lightheaded, falls °pain, tingling, numbness, or weakness in the legs °signs and symptoms of infection like fever or chills; cough; flu-like symptoms; sore throat °vaginal bleeding °Side effects that usually do not require medical attention (report to your doctor or health care professional if they continue or are bothersome): °aches, pains °constipation °diarrhea °headache °hot flashes °nausea, vomiting °pain at site where injected °stomach pain °This list may not describe all possible side effects. Call your doctor for medical advice about side effects. You may report side effects to FDA at 1-800-FDA-1088. °Where should I keep my medication? °This drug is given in a hospital or clinic and will not be stored at home. °NOTE: This sheet is a summary. It may not cover all possible information. If you have   questions about this medicine, talk to your doctor, pharmacist, or health care provider. °© 2022 [employer] (2017-05-24 00:00:00) ° °

## 2021-02-17 ENCOUNTER — Other Ambulatory Visit (HOSPITAL_COMMUNITY): Payer: Self-pay

## 2021-03-02 ENCOUNTER — Telehealth: Payer: Self-pay | Admitting: *Deleted

## 2021-03-02 ENCOUNTER — Other Ambulatory Visit: Payer: Self-pay | Admitting: *Deleted

## 2021-03-02 DIAGNOSIS — Z170 Estrogen receptor positive status [ER+]: Secondary | ICD-10-CM

## 2021-03-02 DIAGNOSIS — C7951 Secondary malignant neoplasm of bone: Secondary | ICD-10-CM

## 2021-03-02 DIAGNOSIS — C50311 Malignant neoplasm of lower-inner quadrant of right female breast: Secondary | ICD-10-CM

## 2021-03-02 NOTE — Progress Notes (Signed)
Per MD request, RN placed orders for CT CAP.

## 2021-03-02 NOTE — Telephone Encounter (Signed)
Received call from pt with complaint of increase sinus drainage, cough, and change in sleep pattern.  Pt believes symptoms are related to Faslodex injection.  Pt states she looked up the active ingredients and Sulfur was listed, pt states she is allergic to Sulfur.  Pt requesting appt with MD to discuss change in therapy.  Appt scheduled, pt verbalized understanding of date and time.

## 2021-03-03 ENCOUNTER — Other Ambulatory Visit: Payer: Self-pay | Admitting: *Deleted

## 2021-03-03 ENCOUNTER — Telehealth: Payer: Self-pay | Admitting: Hematology and Oncology

## 2021-03-03 DIAGNOSIS — C7951 Secondary malignant neoplasm of bone: Secondary | ICD-10-CM

## 2021-03-03 NOTE — Telephone Encounter (Signed)
Sch per 1/10 inbsket, unable to leave msg will mail calendar

## 2021-03-05 ENCOUNTER — Other Ambulatory Visit: Payer: Self-pay | Admitting: Internal Medicine

## 2021-03-09 ENCOUNTER — Other Ambulatory Visit: Payer: Self-pay

## 2021-03-09 ENCOUNTER — Inpatient Hospital Stay: Payer: Medicare Other | Attending: Medical

## 2021-03-09 ENCOUNTER — Other Ambulatory Visit: Payer: Medicare Other

## 2021-03-09 DIAGNOSIS — C50311 Malignant neoplasm of lower-inner quadrant of right female breast: Secondary | ICD-10-CM | POA: Insufficient documentation

## 2021-03-09 DIAGNOSIS — Z79899 Other long term (current) drug therapy: Secondary | ICD-10-CM | POA: Diagnosis not present

## 2021-03-09 DIAGNOSIS — Z79811 Long term (current) use of aromatase inhibitors: Secondary | ICD-10-CM | POA: Diagnosis not present

## 2021-03-09 DIAGNOSIS — Z170 Estrogen receptor positive status [ER+]: Secondary | ICD-10-CM | POA: Diagnosis not present

## 2021-03-09 DIAGNOSIS — Z9011 Acquired absence of right breast and nipple: Secondary | ICD-10-CM | POA: Diagnosis not present

## 2021-03-09 DIAGNOSIS — Z923 Personal history of irradiation: Secondary | ICD-10-CM | POA: Insufficient documentation

## 2021-03-09 DIAGNOSIS — Z5111 Encounter for antineoplastic chemotherapy: Secondary | ICD-10-CM | POA: Diagnosis present

## 2021-03-09 DIAGNOSIS — C7951 Secondary malignant neoplasm of bone: Secondary | ICD-10-CM | POA: Insufficient documentation

## 2021-03-09 LAB — CMP (CANCER CENTER ONLY)
ALT: 13 U/L (ref 0–44)
AST: 16 U/L (ref 15–41)
Albumin: 4.1 g/dL (ref 3.5–5.0)
Alkaline Phosphatase: 95 U/L (ref 38–126)
Anion gap: 7 (ref 5–15)
BUN: 15 mg/dL (ref 6–20)
CO2: 27 mmol/L (ref 22–32)
Calcium: 10 mg/dL (ref 8.9–10.3)
Chloride: 105 mmol/L (ref 98–111)
Creatinine: 0.76 mg/dL (ref 0.44–1.00)
GFR, Estimated: >60 mL/min (ref >60)
Glucose, Bld: 104 mg/dL — ABNORMAL HIGH (ref 70–99)
Potassium: 4.2 mmol/L (ref 3.5–5.1)
Sodium: 139 mmol/L (ref 135–145)
Total Bilirubin: 0.5 mg/dL (ref 0.3–1.2)
Total Protein: 7.4 g/dL (ref 6.5–8.1)

## 2021-03-09 LAB — CBC WITH DIFFERENTIAL (CANCER CENTER ONLY)
Abs Immature Granulocytes: 0.01 10*3/uL (ref 0.00–0.07)
Basophils Absolute: 0.1 10*3/uL (ref 0.0–0.1)
Basophils Relative: 1 %
Eosinophils Absolute: 0.2 10*3/uL (ref 0.0–0.5)
Eosinophils Relative: 6 %
HCT: 42.5 % (ref 36.0–46.0)
Hemoglobin: 14.4 g/dL (ref 12.0–15.0)
Immature Granulocytes: 0 %
Lymphocytes Relative: 14 %
Lymphs Abs: 0.5 10*3/uL — ABNORMAL LOW (ref 0.7–4.0)
MCH: 32.5 pg (ref 26.0–34.0)
MCHC: 33.9 g/dL (ref 30.0–36.0)
MCV: 95.9 fL (ref 80.0–100.0)
Monocytes Absolute: 0.5 10*3/uL (ref 0.1–1.0)
Monocytes Relative: 13 %
Neutro Abs: 2.5 10*3/uL (ref 1.7–7.7)
Neutrophils Relative %: 66 %
Platelet Count: 307 10*3/uL (ref 150–400)
RBC: 4.43 MIL/uL (ref 3.87–5.11)
RDW: 12.6 % (ref 11.5–15.5)
WBC Count: 3.8 10*3/uL — ABNORMAL LOW (ref 4.0–10.5)
nRBC: 0 % (ref 0.0–0.2)

## 2021-03-11 ENCOUNTER — Ambulatory Visit (HOSPITAL_COMMUNITY)
Admission: RE | Admit: 2021-03-11 | Discharge: 2021-03-11 | Disposition: A | Discharge status: Home / Self Care | Payer: Medicare Other | Source: Ambulatory Visit | Attending: Hematology and Oncology | Admitting: Hematology and Oncology

## 2021-03-11 ENCOUNTER — Encounter (HOSPITAL_COMMUNITY): Payer: Self-pay

## 2021-03-11 DIAGNOSIS — Z170 Estrogen receptor positive status [ER+]: Secondary | ICD-10-CM | POA: Insufficient documentation

## 2021-03-11 DIAGNOSIS — C50311 Malignant neoplasm of lower-inner quadrant of right female breast: Secondary | ICD-10-CM | POA: Diagnosis present

## 2021-03-11 DIAGNOSIS — C7951 Secondary malignant neoplasm of bone: Secondary | ICD-10-CM | POA: Insufficient documentation

## 2021-03-11 MED ORDER — IOHEXOL 300 MG/ML  SOLN
100.0000 mL | Freq: Once | INTRAMUSCULAR | Status: AC | PRN
  Administered 2021-03-11: 100 mL via INTRAVENOUS

## 2021-03-11 MED ORDER — SODIUM CHLORIDE (PF) 0.9 % IJ SOLN
INTRAMUSCULAR | Status: AC
  Filled 2021-03-11: qty 50

## 2021-03-12 ENCOUNTER — Other Ambulatory Visit (HOSPITAL_COMMUNITY): Payer: Self-pay

## 2021-03-12 NOTE — Assessment & Plan Note (Signed)
Right breast irregular mass lower inner quadrant retroareolar 02/19/2015: 2.2 x 1.3 x 1.7 cm with architectural distortion and skin/nipple retraction, T2 N0 stage II a clinical stage, additional benign cysts largest 1.2 cm Right breast biopsy 02/20/2015 5:00: Invasive ductal carcinoma grade 2, perineural invasion present, ER 90%, PR 70%, HER-2 negative ratio 0.95, Ki-67 15%   Breast MRI 03/07/2015: Subareolar mass 2.2 x 2.4 x 2.5 cm extending into the retracted nipple, extending inferiorly and laterally over a distance of 4 cm, several level I right axillary lymph nodes with cortical thickening up to 7 mm with a maximum diameter of 15 mm  05/16/2019:[facility] molecular testing: ER positive, PI K3 CA mutation present, HER-2 negative, ER positive TMB low BRCA1 not detected, ESR 1 not detected, PD-L1 negative --------------------------------------------------------------------- Treatment summary:Anastrozole with Delton See started April 2020Ibrance added 09/29/2018 Bone scan: 09/20/2018: Progression of bone metastatic disease new metastases disease right lesser trochanter, mid left femoral diaphysis, additional bone mets involving left frontal, right iliac, right SI joint, right sixth rib, 12th rib, T12 transverse process and T10  Ibrancetoxicities:No adverse effectspreviously She resumedIbrance February 23, 2019  11/16/2018:Right mastectomy ([facility])performed because the tumor was fungating: IDC, grade 1, 5.2cm, grossly involving underlying skin, clear margins.  Radiation to chest wall: 12/19/18- 01/30/19 Bone scan 06/26/2019: Stable distribution of bone metastases. CT CAP3/08/2020:Mixed lytic and sclerotic bone metastases several demonstrating increased lytic character concern for worsening bone mets.  Plan: Add Faslodex to Ibrance and letrozole  CT CAP 03/12/21: Widespread bone mets mildly progressed, no new masses or LN in CAP

## 2021-03-12 NOTE — Progress Notes (Signed)
Patient Care Team: Dorna Mai, MD as PCP - General (Family Medicine)  DIAGNOSIS:    ICD-10-CM   1. Malignant neoplasm of lower-inner quadrant of right breast of female, estrogen receptor positive (Emily Wilson)  C50.311    Z17.0       SUMMARY OF ONCOLOGIC HISTORY: Oncology History  Malignant neoplasm of lower-inner quadrant of right breast of female, estrogen receptor positive (Emily Wilson)  02/19/2015 Mammogram   Right breast irregular mass lower inner quadrant 2.2 x 1.3 x 1.7 cm with architectural distortion and skin/nipple retraction, T2 N0 stage II a clinical stage, additional benign cysts largest 1.2 cm   02/20/2015 Initial Diagnosis   Right breast biopsy 5:00: Invasive ductal carcinoma grade 2, perineural invasion present, ER 90%, PR 70%, HER-2 negative ratio 0.95, Ki-67 15%    03/07/2015 Breast MRI   right breast inferior subareolar mass measuring 2.2 x 2.4 x 2.5 cm with skin and nipple enhancement, extending inferiorly and laterally is intraductal enhancement over a distance of 4 cm, several level I right axillary lymph nodes with cortical thickening   03/13/2015 -  Anti-estrogen oral therapy   Neoadjuvant antiestrogen therapy with tamoxifen 20 mg daily (because the patient did not want to undergo surgery immediately for multiple family reasons) stopped in late 2017; anastrozole started 05/11/18   05/23/2018 Imaging   CT CAP: 2.9 cm spiculated soft tissue density central right breast, no evidence of soft tissue metastatic disease within the chest abdomen pelvis.  Subcentimeter sclerotic bone lesions T10, left pubis, right posterior ilium Bone scan: Foci of uptake left frontal calvarium, T-spine, right ilium   10/04/2018 -  Anti-estrogen oral therapy   Ibrance with anastrozole   11/16/2018 Surgery   Right mastectomy Emily Wilson): IDC, grade 1, 5.2cm, grossly involving underlying skin, clear margins.   12/07/2018 Cancer Staging   Staging form: Breast, AJCC 7th Edition - Pathologic stage  from 12/07/2018: Stage IV (yT4b, NX, M1) - Signed by Eppie Gibson, MD on 12/07/2018    12/19/2018 - 01/30/2019 Radiation Therapy   Adjuvant radiation   05/16/2019 Miscellaneous   [facility] molecular testing: ER positive, PI K3 CA mutation present, HER-2 negative, ER positive TMB low BRCA1 not detected, ESR 1 not detected, PD-L1 negative     CHIEF COMPLIANT: Follow-up of metastatic breast on Ibrance and anastrozole  INTERVAL HISTORY: Emily Wilson is a 56 y.o. with above-mentioned history of metastatic breast cancer currently on anastrozole and Ibrance. CT CAP 03/11/2021 showed widespread osseous metastatic disease which has mildly progressed and stable 3 mm subpleural nodule in the left upper lobe. She reports to the clinic today for follow-up.  She reports that ever since she started Faslodex injections she has gotten significantly worse allergic symptoms and after much research she detected that there must be sulfur in the Faslodex injection.  She does not want to receive the Faslodex any further.  ALLERGIES:  is allergic to doxycycline, naproxen, codeine, hydrocodone-acetaminophen, ibuprofen, lantus [insulin glargine], meloxicam, propoxyphene n-acetaminophen, sulfonamide derivatives, and tramadol.  MEDICATIONS:  Current Outpatient Medications  Medication Sig Dispense Refill   Accu-Chek FastClix Lancets MISC USE TO TEST BLOOD SUGAR UP TO FOUR TIMES DAILY AS DIRECTED 204 each 2   ACCU-CHEK GUIDE test strip USE TO CHECK BLOOD SUGAR UP TO 4 TIMES A DAY. DX: E11.65 200 strip 2   BD INSULIN SYRINGE U/F 31G X 5/16" 0.5 ML MISC USE TO ADMINISTER INSULIN 3 TIMES DAILY WITH MEALS 100 each 3   diclofenac (VOLTAREN) 75 MG EC tablet Take 75 mg  by mouth 2 (two) times daily as needed.     gabapentin (NEURONTIN) 100 MG capsule Take 3 capsules (300 mg total) by mouth 3 (three) times daily. 90 capsule 1   HYDROcodone-acetaminophen (NORCO/VICODIN) 5-325 MG tablet Take 1 tablet by mouth 2 (two) times daily as  needed.     insulin NPH Human (HUMULIN N) 100 UNIT/ML injection INJECT 12 UNITS INTO THE SKIN AT BEDTIME 10 mL 1   insulin NPH Human (NOVOLIN N) 100 UNIT/ML injection Inject 12 units SQ daily 10 mL 11   insulin regular (NOVOLIN R) 100 units/mL injection Inject 0.12 mLs (12 Units total) into the skin 3 (three) times daily before meals. E.11.9. Hold dose for blood sugar less than 125 10 mL 11   letrozole (FEMARA) 2.5 MG tablet TAKE 1 TABLET BY MOUTH EVERY DAY 90 tablet 3   metoprolol succinate (TOPROL-XL) 25 MG 24 hr tablet TAKE 1 TABLET (25 MG TOTAL) BY MOUTH DAILY. PLEASE ASK PT TO CALL AND MAKE APPT WITH PROVIDER FOR ADDITIONAL REFILLS - 1ST ATTEMPT 30 tablet 0   Milk Thistle 1000 MG CAPS Take 1,000 mg by mouth daily. (Patient not taking: Reported on 11/24/2020)     Multiple Vitamin (MULTIVITAMIN WITH MINERALS) TABS tablet Take 1 tablet by mouth daily.     ondansetron (ZOFRAN ODT) 8 MG disintegrating tablet Take 1 tablet (8 mg total) by mouth every 8 (eight) hours as needed for nausea or vomiting. 20 tablet 1   palbociclib (IBRANCE) 125 MG tablet TAKE 1 TABLET (125 MG TOTAL) BY MOUTH DAILY. TAKE FOR 21 DAYS ON, 7 DAYS OFF, REPEAT EVERY 28 DAYS. 21 tablet 3   rosuvastatin (CRESTOR) 40 MG tablet Take 1 tablet (40 mg total) by mouth daily. 90 tablet 3   Vitamin D, Ergocalciferol, (DRISDOL) 1.25 MG (50000 UNIT) CAPS capsule Take 1 capsule by mouth once a week. (Patient not taking: Reported on 12/19/2020)     No current facility-administered medications for this visit.    PHYSICAL EXAMINATION: ECOG PERFORMANCE STATUS: 1 - Symptomatic but completely ambulatory  Vitals:   03/13/21 1116  BP: 124/76  Pulse: 97  Resp: 18  Temp: (!) 97.3 F (36.3 C)  SpO2: 97%   Filed Weights   03/13/21 1116  Weight: 152 lb 6.4 oz (69.1 kg)     LABORATORY DATA:  I have reviewed the data as listed CMP Latest Ref Rng & Units 03/09/2021 01/21/2021 10/28/2020  Glucose 70 - 99 mg/dL 104(H) 111(H) 103(H)  BUN 6 - 20  mg/dL '15 17 18  ' Creatinine 0.44 - 1.00 mg/dL 0.76 0.58 0.77  Sodium 135 - 145 mmol/L 139 137 141  Potassium 3.5 - 5.1 mmol/L 4.2 3.9 4.3  Chloride 98 - 111 mmol/L 105 105 105  CO2 22 - 32 mmol/L '27 24 24  ' Calcium 8.9 - 10.3 mg/dL 10.0 9.1 9.8  Total Protein 6.5 - 8.1 g/dL 7.4 7.4 7.3  Total Bilirubin 0.3 - 1.2 mg/dL 0.5 0.4 0.4  Alkaline Phos 38 - 126 U/L 95 102 217(H)  AST 15 - 41 U/L '16 19 18  ' ALT 0 - 44 U/L '13 16 16    ' Lab Results  Component Value Date   WBC 3.8 (L) 03/09/2021   HGB 14.4 03/09/2021   HCT 42.5 03/09/2021   MCV 95.9 03/09/2021   PLT 307 03/09/2021   NEUTROABS 2.5 03/09/2021    ASSESSMENT & PLAN:  Malignant neoplasm of lower-inner quadrant of right breast of female, estrogen receptor positive ([place]) Right breast  irregular mass lower inner quadrant retroareolar 02/19/2015: 2.2 x 1.3 x 1.7 cm with architectural distortion and skin/nipple retraction, T2 N0 stage II a clinical stage, additional benign cysts largest 1.2 cm Right breast biopsy 02/20/2015 5:00: Invasive ductal carcinoma grade 2, perineural invasion present, ER 90%, PR 70%, HER-2 negative ratio 0.95, Ki-67 15%     Breast MRI 03/07/2015: Subareolar mass 2.2 x 2.4 x 2.5 cm extending into the retracted nipple, extending inferiorly and laterally over a distance of 4 cm, several level I right axillary lymph nodes with cortical thickening up to 7 mm with a maximum diameter of 15 mm  11/16/2018:Right mastectomy Emily Wilson) performed because the tumor was fungating: IDC, grade 1, 5.2cm, grossly involving underlying skin, clear margins. Radiation to chest wall: 12/19/18- 01/30/19 Bone scan 06/26/2019: Stable distribution of bone metastases. CT CAP 04/28/2020: Mixed lytic and sclerotic bone metastases several demonstrating increased lytic character concern for worsening bone mets. 05/16/2019:[facility] molecular testing: ER positive, PI K3 CA mutation present, HER-2 negative, ER positive TMB low BRCA1 not detected, ESR 1 not  detected, PD-L1 negative --------------------------------------------------------------------- Treatment summary: Anastrozole with Xgeva started April 2020 Ibrance added 09/29/2018, Faslodex 11/20/2020 discontinued January 2023 (due to progression and toxicity)     Plan: Options include alpelisib versus Elacestrant (when it becomes commercially available)   CT CAP 03/12/21: Widespread bone mets mildly progressed, no new masses or LN in CAP Return to clinic in 1 month to make a final decision on the treatment plan.    No orders of the defined types were placed in this encounter.  The patient has a good understanding of the overall plan. she agrees with it. she will call with any problems that may develop before the next visit here.  Total time spent: 30 mins including face to face time and time spent for planning, charting and coordination of care  Rulon Eisenmenger, MD, MPH 03/13/2021  I, Thana Ates, am acting as scribe for Dr. [name].  I have reviewed the above documentation for accuracy and completeness, and I agree with the above.

## 2021-03-13 ENCOUNTER — Other Ambulatory Visit: Payer: Self-pay

## 2021-03-13 ENCOUNTER — Inpatient Hospital Stay (HOSPITAL_BASED_OUTPATIENT_CLINIC_OR_DEPARTMENT_OTHER): Payer: Medicare Other | Admitting: Hematology and Oncology

## 2021-03-13 ENCOUNTER — Ambulatory Visit: Payer: Medicare Other

## 2021-03-13 DIAGNOSIS — Z170 Estrogen receptor positive status [ER+]: Secondary | ICD-10-CM

## 2021-03-13 DIAGNOSIS — C50311 Malignant neoplasm of lower-inner quadrant of right female breast: Secondary | ICD-10-CM

## 2021-03-17 ENCOUNTER — Other Ambulatory Visit (HOSPITAL_COMMUNITY): Payer: Self-pay

## 2021-03-19 ENCOUNTER — Ambulatory Visit: Payer: Medicare Other

## 2021-04-01 ENCOUNTER — Other Ambulatory Visit: Payer: Self-pay | Admitting: Internal Medicine

## 2021-04-08 ENCOUNTER — Other Ambulatory Visit: Payer: Self-pay

## 2021-04-08 DIAGNOSIS — Z170 Estrogen receptor positive status [ER+]: Secondary | ICD-10-CM

## 2021-04-08 DIAGNOSIS — C50311 Malignant neoplasm of lower-inner quadrant of right female breast: Secondary | ICD-10-CM

## 2021-04-08 NOTE — Progress Notes (Signed)
Patient Care Team: Dorna Mai, MD as PCP - General (Family Medicine)  DIAGNOSIS:    ICD-10-CM   1. Malignant neoplasm of lower-inner quadrant of right breast of female, estrogen receptor positive ([place])  C50.311    Z17.0       SUMMARY OF ONCOLOGIC HISTORY: Oncology History  Malignant neoplasm of lower-inner quadrant of right breast of female, estrogen receptor positive ([place])  02/19/2015 Mammogram   Right breast irregular mass lower inner quadrant 2.2 x 1.3 x 1.7 cm with architectural distortion and skin/nipple retraction, T2 N0 stage II a clinical stage, additional benign cysts largest 1.2 cm   02/20/2015 Initial Diagnosis   Right breast biopsy 5:00: Invasive ductal carcinoma grade 2, perineural invasion present, ER 90%, PR 70%, HER-2 negative ratio 0.95, Ki-67 15%    03/07/2015 Breast MRI   right breast inferior subareolar mass measuring 2.2 x 2.4 x 2.5 cm with skin and nipple enhancement, extending inferiorly and laterally is intraductal enhancement over a distance of 4 cm, several level I right axillary lymph nodes with cortical thickening   03/13/2015 -  Anti-estrogen oral therapy   Neoadjuvant antiestrogen therapy with tamoxifen 20 mg daily (because the patient did not want to undergo surgery immediately for multiple family reasons) stopped in late 2017; anastrozole started 05/11/18   05/23/2018 Imaging   CT CAP: 2.9 cm spiculated soft tissue density central right breast, no evidence of soft tissue metastatic disease within the chest abdomen pelvis.  Subcentimeter sclerotic bone lesions T10, left pubis, right posterior ilium Bone scan: Foci of uptake left frontal calvarium, T-spine, right ilium   10/04/2018 -  Anti-estrogen oral therapy   Ibrance with anastrozole   11/16/2018 Surgery   Right mastectomy Ninfa Linden): IDC, grade 1, 5.2cm, grossly involving underlying skin, clear margins.   12/07/2018 Cancer Staging   Staging form: Breast, AJCC 7th Edition - Pathologic stage  from 12/07/2018: Stage IV (yT4b, NX, M1) - Signed by Eppie Gibson, MD on 12/07/2018    12/19/2018 - 01/30/2019 Radiation Therapy   Adjuvant radiation   05/16/2019 Miscellaneous   [facility] molecular testing: ER positive, PI K3 CA mutation present, HER-2 negative, ER positive TMB low BRCA1 not detected, ESR 1 not detected, PD-L1 negative     CHIEF COMPLIANT: Follow-up of metastatic breast on Ibrance and anastrozole  INTERVAL HISTORY: Emily Wilson is a 56 y.o. with above-mentioned history of metastatic breast cancer currently on anastrozole and Ibrance. She reports to the clinic today for follow-up.  She reports that since Faslodex was discontinued she was feeling a lot better.  Although she continues to feel fatigued.  ALLERGIES:  is allergic to doxycycline, naproxen, codeine, hydrocodone-acetaminophen, ibuprofen, lantus [insulin glargine], meloxicam, propoxyphene n-acetaminophen, sulfonamide derivatives, and tramadol.  MEDICATIONS:  Current Outpatient Medications  Medication Sig Dispense Refill   Accu-Chek FastClix Lancets MISC USE TO TEST BLOOD SUGAR UP TO FOUR TIMES DAILY AS DIRECTED 204 each 2   ACCU-CHEK GUIDE test strip USE TO CHECK BLOOD SUGAR UP TO 4 TIMES A DAY. DX: E11.65 200 strip 2   BD INSULIN SYRINGE U/F 31G X 5/16" 0.5 ML MISC USE TO ADMINISTER INSULIN 3 TIMES DAILY WITH MEALS 100 each 3   diclofenac (VOLTAREN) 75 MG EC tablet Take 75 mg by mouth 2 (two) times daily as needed.     gabapentin (NEURONTIN) 100 MG capsule Take 3 capsules (300 mg total) by mouth 3 (three) times daily. 90 capsule 1   HYDROcodone-acetaminophen (NORCO/VICODIN) 5-325 MG tablet Take 1 tablet by mouth 2 (  two) times daily as needed.     insulin NPH Human (HUMULIN N) 100 UNIT/ML injection INJECT 12 UNITS INTO THE SKIN AT BEDTIME 10 mL 1   insulin regular (NOVOLIN R) 100 units/mL injection Inject 0.12 mLs (12 Units total) into the skin 3 (three) times daily before meals. E.11.9. Hold dose for blood sugar  less than 125 10 mL 11   letrozole (FEMARA) 2.5 MG tablet TAKE 1 TABLET BY MOUTH EVERY DAY 90 tablet 3   metoprolol succinate (TOPROL-XL) 25 MG 24 hr tablet Take 1 tablet (25 mg total) by mouth daily. Please keep upcoming appt for future refills. 90 tablet 0   Milk Thistle 1000 MG CAPS Take 1,000 mg by mouth daily. (Patient not taking: Reported on 11/24/2020)     Multiple Vitamin (MULTIVITAMIN WITH MINERALS) TABS tablet Take 1 tablet by mouth daily.     ondansetron (ZOFRAN ODT) 8 MG disintegrating tablet Take 1 tablet (8 mg total) by mouth every 8 (eight) hours as needed for nausea or vomiting. 20 tablet 1   palbociclib (IBRANCE) 125 MG tablet TAKE 1 TABLET (125 MG TOTAL) BY MOUTH DAILY. TAKE FOR 21 DAYS ON, 7 DAYS OFF, REPEAT EVERY 28 DAYS. 21 tablet 3   rosuvastatin (CRESTOR) 40 MG tablet Take 1 tablet (40 mg total) by mouth daily. 90 tablet 3   Vitamin D, Ergocalciferol, (DRISDOL) 1.25 MG (50000 UNIT) CAPS capsule Take 1 capsule by mouth once a week. (Patient not taking: Reported on 12/19/2020)     No current facility-administered medications for this visit.    PHYSICAL EXAMINATION: ECOG PERFORMANCE STATUS: 1 - Symptomatic but completely ambulatory  Vitals:   04/09/21 1053  BP: 132/73  Pulse: 98  Resp: 17  Temp: 97.9 F (36.6 C)  SpO2: 98%   Filed Weights   04/09/21 1053  Weight: 153 lb 12.8 oz (69.8 kg)      LABORATORY DATA:  I have reviewed the data as listed CMP Latest Ref Rng & Units 04/09/2021 03/09/2021 01/21/2021  Glucose 70 - 99 mg/dL 138(H) 104(H) 111(H)  BUN 6 - 20 mg/dL '11 15 17  ' Creatinine 0.44 - 1.00 mg/dL 0.64 0.76 0.58  Sodium 135 - 145 mmol/L 137 139 137  Potassium 3.5 - 5.1 mmol/L 3.9 4.2 3.9  Chloride 98 - 111 mmol/L 105 105 105  CO2 22 - 32 mmol/L '24 27 24  ' Calcium 8.9 - 10.3 mg/dL 9.0 10.0 9.1  Total Protein 6.5 - 8.1 g/dL 7.0 7.4 7.4  Total Bilirubin 0.3 - 1.2 mg/dL 0.5 0.5 0.4  Alkaline Phos 38 - 126 U/L 83 95 102  AST 15 - 41 U/L '15 16 19  ' ALT 0 -  44 U/L '13 13 16    ' Lab Results  Component Value Date   WBC 5.1 04/09/2021   HGB 14.1 04/09/2021   HCT 41.6 04/09/2021   MCV 95.4 04/09/2021   PLT 290 04/09/2021   NEUTROABS 3.4 04/09/2021    ASSESSMENT & PLAN:  Malignant neoplasm of lower-inner quadrant of right breast of female, estrogen receptor positive ([place]) Right breast irregular mass lower inner quadrant retroareolar 02/19/2015: 2.2 x 1.3 x 1.7 cm with architectural distortion and skin/nipple retraction, T2 N0 stage II a clinical stage, additional benign cysts largest 1.2 cm Right breast biopsy 02/20/2015 5:00: Invasive ductal carcinoma grade 2, perineural invasion present, ER 90%, PR 70%, HER-2 negative ratio 0.95, Ki-67 15%     Breast MRI 03/07/2015: Subareolar mass 2.2 x 2.4 x 2.5 cm extending into the  retracted nipple, extending inferiorly and laterally over a distance of 4 cm, several level I right axillary lymph nodes with cortical thickening up to 7 mm with a maximum diameter of 15 mm   11/16/2018:Right mastectomy Ninfa Linden) performed because the tumor was fungating: IDC, grade 1, 5.2cm, grossly involving underlying skin, clear margins. Radiation to chest wall: 12/19/18- 01/30/19 Bone scan 06/26/2019: Stable distribution of bone metastases. CT CAP 04/28/2020: Mixed lytic and sclerotic bone metastases several demonstrating increased lytic character concern for worsening bone mets. 05/16/2019:[facility] molecular testing: ER positive, PI K3 CA mutation present, HER-2 negative, ER positive TMB low BRCA1 not detected, ESR 1 not detected, PD-L1 negative --------------------------------------------------------------------- Treatment summary: Anastrozole with Xgeva started April 2020 Ibrance added 09/29/2018, Faslodex 11/20/2020 discontinued January 2023 (due to progression and toxicity)     Plan: Options include alpelisib versus Verzenio We decided to start her on Verzenio with the letrozole along with Xgeva.  Abemaciclib counseling: I  discussed at length the risks and benefits of Abemaciclib in combination with letrozole. Adverse effects of Abemaciclib include decreasing neutrophil count, pneumonia, blood clots in lungs as well as nausea and GI symptoms. Side effects of letrozole include hot flashes, muscle aches and pains, uterine bleeding/spotting/cancer, osteoporosis, risk of blood clots.   CT CAP 03/12/21: Widespread bone mets mildly progressed, no new masses or LN in CAP Recommended switching her to [facility] with letrozole  Return to clinic in 2 weeks to see Cindra Eves  No orders of the defined types were placed in this encounter.  The patient has a good understanding of the overall plan. she agrees with it. she will call with any problems that may develop before the next visit here.  Total time spent: 45 mins including face to face time and time spent for planning, charting and coordination of care  Rulon Eisenmenger, MD, MPH 04/09/2021  I, Thana Ates, am acting as scribe for Dr. [name].  I have reviewed the above documentation for accuracy and completeness, and I agree with the above.

## 2021-04-09 ENCOUNTER — Telehealth: Payer: Self-pay

## 2021-04-09 ENCOUNTER — Other Ambulatory Visit (HOSPITAL_COMMUNITY): Payer: Self-pay

## 2021-04-09 ENCOUNTER — Other Ambulatory Visit: Payer: Self-pay

## 2021-04-09 ENCOUNTER — Inpatient Hospital Stay (HOSPITAL_BASED_OUTPATIENT_CLINIC_OR_DEPARTMENT_OTHER): Payer: Medicare Other | Admitting: Hematology and Oncology

## 2021-04-09 ENCOUNTER — Inpatient Hospital Stay: Payer: Medicare Other | Attending: Medical

## 2021-04-09 DIAGNOSIS — Z923 Personal history of irradiation: Secondary | ICD-10-CM | POA: Diagnosis not present

## 2021-04-09 DIAGNOSIS — Z170 Estrogen receptor positive status [ER+]: Secondary | ICD-10-CM

## 2021-04-09 DIAGNOSIS — Z9011 Acquired absence of right breast and nipple: Secondary | ICD-10-CM | POA: Diagnosis not present

## 2021-04-09 DIAGNOSIS — Z79811 Long term (current) use of aromatase inhibitors: Secondary | ICD-10-CM | POA: Diagnosis not present

## 2021-04-09 DIAGNOSIS — Z79899 Other long term (current) drug therapy: Secondary | ICD-10-CM | POA: Insufficient documentation

## 2021-04-09 DIAGNOSIS — C7951 Secondary malignant neoplasm of bone: Secondary | ICD-10-CM | POA: Insufficient documentation

## 2021-04-09 DIAGNOSIS — C50311 Malignant neoplasm of lower-inner quadrant of right female breast: Secondary | ICD-10-CM | POA: Diagnosis not present

## 2021-04-09 LAB — CBC WITH DIFFERENTIAL (CANCER CENTER ONLY)
Abs Immature Granulocytes: 0.03 10*3/uL (ref 0.00–0.07)
Basophils Absolute: 0.1 10*3/uL (ref 0.0–0.1)
Basophils Relative: 1 %
Eosinophils Absolute: 0.2 10*3/uL (ref 0.0–0.5)
Eosinophils Relative: 4 %
HCT: 41.6 % (ref 36.0–46.0)
Hemoglobin: 14.1 g/dL (ref 12.0–15.0)
Immature Granulocytes: 1 %
Lymphocytes Relative: 14 %
Lymphs Abs: 0.7 10*3/uL (ref 0.7–4.0)
MCH: 32.3 pg (ref 26.0–34.0)
MCHC: 33.9 g/dL (ref 30.0–36.0)
MCV: 95.4 fL (ref 80.0–100.0)
Monocytes Absolute: 0.7 10*3/uL (ref 0.1–1.0)
Monocytes Relative: 14 %
Neutro Abs: 3.4 10*3/uL (ref 1.7–7.7)
Neutrophils Relative %: 66 %
Platelet Count: 290 10*3/uL (ref 150–400)
RBC: 4.36 MIL/uL (ref 3.87–5.11)
RDW: 12.3 % (ref 11.5–15.5)
WBC Count: 5.1 10*3/uL (ref 4.0–10.5)
nRBC: 0 % (ref 0.0–0.2)

## 2021-04-09 LAB — CMP (CANCER CENTER ONLY)
ALT: 13 U/L (ref 0–44)
AST: 15 U/L (ref 15–41)
Albumin: 4 g/dL (ref 3.5–5.0)
Alkaline Phosphatase: 83 U/L (ref 38–126)
Anion gap: 8 (ref 5–15)
BUN: 11 mg/dL (ref 6–20)
CO2: 24 mmol/L (ref 22–32)
Calcium: 9 mg/dL (ref 8.9–10.3)
Chloride: 105 mmol/L (ref 98–111)
Creatinine: 0.64 mg/dL (ref 0.44–1.00)
GFR, Estimated: >60 mL/min (ref >60)
Glucose, Bld: 138 mg/dL — ABNORMAL HIGH (ref 70–99)
Potassium: 3.9 mmol/L (ref 3.5–5.1)
Sodium: 137 mmol/L (ref 135–145)
Total Bilirubin: 0.5 mg/dL (ref 0.3–1.2)
Total Protein: 7 g/dL (ref 6.5–8.1)

## 2021-04-09 MED ORDER — ABEMACICLIB 50 MG PO TABS
50.0000 mg | ORAL_TABLET | Freq: Two times a day (BID) | ORAL | 0 refills | Status: DC
  Filled 2021-04-09: qty 70, 35d supply, fill #0
  Filled 2021-04-15: qty 56, 28d supply, fill #0

## 2021-04-09 NOTE — Telephone Encounter (Signed)
Oral Oncology Pharmacist Encounter  Received new prescription for abemaciclib (Verzenio) for the treatment of metastatic, hormone receptor positive, HER2 negative breast cancer in conjunction with letrozole, planned duration until disease progression or unacceptable toxicity. Prescription dose and frequency assessed.  Labs from 04/09/21 assessed, no interventions needed.  Current medication list in Epic reviewed, DDIs with Verzenio identified: none  Evaluated chart and no patient barriers to medication adherence noted.   Patient agreement for treatment documented in MD note on 03/09/2021.  Prescription has been e-scribed to the [facility] for benefits analysis and approval.  Oral Oncology Clinic will continue to follow for insurance authorization, copayment issues, initial counseling and start date.  Drema Halon, PharmD Hematology/Oncology Clinical Pharmacist [facility] [phone] 04/09/2021 12:06 PM

## 2021-04-09 NOTE — Telephone Encounter (Signed)
Oral Oncology Patient Advocate Encounter   Received notification from [facility] that prior authorization for Verzenino is required.   PA submitted on CoverMyMeds Key [id number] Status is pending   Oral Oncology Clinic will continue to follow.   [facility] Patient [place] Phone [phone] Fax [phone] 04/09/2021 1:35 PM

## 2021-04-09 NOTE — Assessment & Plan Note (Signed)
Right breast irregular mass lower inner quadrant retroareolar 02/19/2015: 2.2 x 1.3 x 1.7 cm with architectural distortion and skin/nipple retraction, T2 N0 stage II a clinical stage, additional benign cysts largest 1.2 cm Right breast biopsy 02/20/2015 5:00: Invasive ductal carcinoma grade 2, perineural invasion present, ER 90%, PR 70%, HER-2 negative ratio 0.95, Ki-67 15%   Breast MRI 03/07/2015: Subareolar mass 2.2 x 2.4 x 2.5 cm extending into the retracted nipple, extending inferiorly and laterally over a distance of 4 cm, several level I right axillary lymph nodes with cortical thickening up to 7 mm with a maximum diameter of 15 mm  11/16/2018:Right mastectomy ([facility])performed because the tumor was fungating: IDC, grade 1, 5.2cm, grossly involving underlying skin, clear margins. Radiation to chest wall: 12/19/18- 01/30/19 Bone scan 06/26/2019: Stable distribution of bone metastases. CT CAP3/08/2020:Mixed lytic and sclerotic bone metastases several demonstrating increased lytic character concern for worsening bone mets. 05/16/2019:Caris molecular testing: ER positive, PI K3 CA mutation present, HER-2 negative, ER positive TMB low BRCA1 not detected, ESR 1 not detected, PD-L1 negative --------------------------------------------------------------------- Treatment summary:Anastrozole with Delton See started April 2020Ibrance added 09/29/2018, Faslodex 11/20/2020 discontinued January 2023 (due to progression and toxicity)   Plan: 1. Options include alpelisib versus Elacestrant (when it becomes commercially available)  CT CAP 03/12/21: Widespread bone mets mildly progressed, no new masses or LN in CAP Recommended switching her to alpelisib with Faslodex

## 2021-04-09 NOTE — Telephone Encounter (Signed)
Oral Oncology Patient Advocate Encounter  Prior Authorization for Melynda Keller has been approved.    PA# [id number] Effective dates: 04/09/21 through further notice  Patients co-pay is $0  Oral Oncology Clinic will continue to follow.   [place] Patient [place] Phone [phone] Fax [phone] 04/09/2021 1:37 PM

## 2021-04-10 ENCOUNTER — Other Ambulatory Visit (HOSPITAL_COMMUNITY): Payer: Self-pay

## 2021-04-14 ENCOUNTER — Encounter: Payer: Self-pay | Admitting: Internal Medicine

## 2021-04-14 ENCOUNTER — Other Ambulatory Visit: Payer: Medicare Other

## 2021-04-14 ENCOUNTER — Other Ambulatory Visit: Payer: Self-pay

## 2021-04-14 ENCOUNTER — Ambulatory Visit (INDEPENDENT_AMBULATORY_CARE_PROVIDER_SITE_OTHER): Payer: Medicare Other | Admitting: Internal Medicine

## 2021-04-14 ENCOUNTER — Ambulatory Visit: Payer: Medicare Other | Admitting: Hematology and Oncology

## 2021-04-14 VITALS — BP 114/68 | HR 89 | Ht <= 58 in | Wt 154.0 lb

## 2021-04-14 DIAGNOSIS — I10 Essential (primary) hypertension: Secondary | ICD-10-CM

## 2021-04-14 DIAGNOSIS — R002 Palpitations: Secondary | ICD-10-CM | POA: Diagnosis not present

## 2021-04-14 MED ORDER — METOPROLOL SUCCINATE ER 25 MG PO TB24: 25.0000 mg | ORAL_TABLET | Freq: Every day | ORAL | 3 refills | Status: AC

## 2021-04-14 NOTE — Telephone Encounter (Signed)
Oral Chemotherapy Pharmacist Encounter  I spoke with patient for overview of: Verzenio for the treatment of metastatic, hormone-receptor positive breast cancer, in combination with letrozole, planned duration until disease progression or unacceptable toxicity.   Counseled patient on administration, dosing, side effects, monitoring, drug-food interactions, safe handling, storage, and disposal.  Patient will take Verzenio 50mg  tablets, 1 tablet by mouth twice daily without regard to food.  Patient knows to avoid grapefruit and grapefruit juice.  Patient is taking letrozole once daily.  Verzenio start date: 04/20/2021 Patient will stop the ibrance prior to starting the verzenio  Adverse effects include but are not limited to: diarrhea, fatigue, nausea, abdominal pain, decreased blood counts, and increased liver function tests, and joint pains. Severe, life-threatening, and/or fatal interstitial lung disease (ILD) and/or pneumonitis may occur with CDK 4/6 inhibitors.  Patient reports she recently has been having joint pains and I informed her to let us know if this is getting worse.  Patient has anti-emetic on hand and knows to take it if nausea develops.   Patient will obtain anti diarrheal and alert the office of 4 or more loose stools above baseline.  Reviewed with patient importance of keeping a medication schedule and plan for any missed doses. No barriers to medication adherence identified.  Medication reconciliation performed and medication/allergy list updated.  Insurance authorization for [employer] has been obtained. Test claim at the pharmacy revealed copayment $0 for 1st fill of 28 days. This will ship from the [place] outpatient pharmacy on 04/15/21 to deliver to patient's home on 04/16/21.  Patient informed the pharmacy will reach out 5-7 days prior to needing next fill of Verzenio to coordinate continued medication acquisition to prevent break in therapy.  All questions  answered.  Mrs. Laforte voiced understanding and appreciation.   Medication education handout placed in mail for patient. Patient knows to call the office with questions or concerns. Oral Chemotherapy Clinic phone number provided to patient.   Drema Halon, PharmD Hematology/Oncology Clinical Pharmacist [facility] [phone] 04/14/2021   11:25 AM

## 2021-04-14 NOTE — Progress Notes (Signed)
HPI Emily Wilson returns today for followup. She is a pleasant 56 yo woman with a h/o SVT, HTN, tobacco abuse in remission. She has a h/o metastatic breast CA. She has occaisional episodes of SVT but for the most part these are well controlled. She has some bone pain and notes mets to the ribs and maybe the spine. She is on suppressive chemotherapy. She denies angina. Her bp is well controlled.  Allergies  Allergen Reactions   Doxycycline Shortness Of Breath    Wheezing, shortness of breath, rash head to toe, and swelling   Naproxen Shortness Of Breath   Codeine Hives   Hydrocodone-Acetaminophen Hives   Ibuprofen     Irritant to stomach r/t diverticulitis   Lantus [Insulin Glargine]     Yeast Infections   Meloxicam Other (See Comments)    Abdominal pain    Propoxyphene N-Acetaminophen Hives   Sulfonamide Derivatives Hives   Tramadol Other (See Comments)    Abdominal Pain     Current Outpatient Medications  Medication Sig Dispense Refill   abemaciclib (VERZENIO) 50 MG tablet Take 1 tablet (50 mg total) by mouth 2 (two) times daily. Swallow tablets whole. Do not chew, crush, or split tablets before swallowing. 60 tablet 0   Accu-Chek FastClix Lancets MISC USE TO TEST BLOOD SUGAR UP TO FOUR TIMES DAILY AS DIRECTED 204 each 2   ACCU-CHEK GUIDE test strip USE TO CHECK BLOOD SUGAR UP TO 4 TIMES A DAY. DX: E11.65 200 strip 2   BD INSULIN SYRINGE U/F 31G X 5/16" 0.5 ML MISC USE TO ADMINISTER INSULIN 3 TIMES DAILY WITH MEALS 100 each 3   diclofenac (VOLTAREN) 75 MG EC tablet Take 75 mg by mouth 2 (two) times daily as needed.     gabapentin (NEURONTIN) 100 MG capsule Take 3 capsules (300 mg total) by mouth 3 (three) times daily. 90 capsule 1   HYDROcodone-acetaminophen (NORCO/VICODIN) 5-325 MG tablet Take 1 tablet by mouth 2 (two) times daily as needed.     insulin NPH Human (HUMULIN N) 100 UNIT/ML injection INJECT 12 UNITS INTO THE SKIN AT BEDTIME 10 mL 1   insulin regular (NOVOLIN  R) 100 units/mL injection Inject 0.12 mLs (12 Units total) into the skin 3 (three) times daily before meals. E.11.9. Hold dose for blood sugar less than 125 10 mL 11   letrozole (FEMARA) 2.5 MG tablet TAKE 1 TABLET BY MOUTH EVERY DAY 90 tablet 3   Milk Thistle 1000 MG CAPS Take 1,000 mg by mouth daily.     Multiple Vitamin (MULTIVITAMIN WITH MINERALS) TABS tablet Take 1 tablet by mouth daily.     ondansetron (ZOFRAN ODT) 8 MG disintegrating tablet Take 1 tablet (8 mg total) by mouth every 8 (eight) hours as needed for nausea or vomiting. 20 tablet 1   rosuvastatin (CRESTOR) 40 MG tablet Take 1 tablet (40 mg total) by mouth daily. 90 tablet 3   Vitamin D, Ergocalciferol, (DRISDOL) 1.25 MG (50000 UNIT) CAPS capsule Take 1 capsule by mouth once a week.     metoprolol succinate (TOPROL-XL) 25 MG 24 hr tablet Take 1 tablet (25 mg total) by mouth daily. 90 tablet 3   No current facility-administered medications for this visit.     Past Medical History:  Diagnosis Date   Anxiety state, unspecified    Arthritis    Breast cancer of lower-inner quadrant of right female breast ([place])    Carpal tunnel syndrome    Complication of anesthesia  woke up during ganglion cyst removal in her 20's, not woken up since   Diabetes mellitus without complication (HCC)    Type II   Heart murmur    History of radiation therapy 12/18/18- 01/30/19   Right Chest wall 25 fractions X 2Gy each to total 50 Gy, followed by a boost 10 Gy in 5 fractions.    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, without mention of thyrotoxic crisis or storm     ROS:   All systems reviewed and negative except as noted in the HPI.   Past Surgical History:  Procedure Laterality Date   ABDOMINAL HYSTERECTOMY     APPENDECTOMY     CARPAL TUNNEL RELEASE     GANGLION CYST EXCISION Right    KNEE SURGERY Right 2007   ACL repair    TOTAL MASTECTOMY Right 11/16/2018   Procedure: RIGHT MASTECTOMY;  Surgeon: Coralie Keens, MD;  Location: [facility];  Service: General;  Laterality: Right;   TUBAL LIGATION       Family History  Problem Relation Age of Onset   Diabetes Mother    Bladder Cancer Mother 14       smoker   Other Mother 38       TAH for unspecified reason   Multiple sclerosis Mother    Cancer Father 36       lymphatic/tonsil cancer - in remission; former smoker   COPD Maternal Grandmother        smoker   Emphysema Maternal Grandmother        smoker   Stroke Maternal Grandfather    Dementia Maternal Uncle    Diabetes Paternal Grandmother    COPD Maternal Uncle        smoker   Multiple sclerosis Paternal Aunt    Breast cancer Paternal Aunt        dx. late 31s - early 69s     Social History   Socioeconomic History   Marital status: Divorced    Spouse name: Not on file   Number of children: Not on file   Years of education: Not on file   Highest education level: Not on file  Occupational History   Occupation: Scientist, water quality  Tobacco Use   Smoking status: Former    Packs/day: 3.00    Years: 30.00    Pack years: 90.00    Types: Cigarettes    Quit date: 10/24/2011    Years since quitting: 9.4   Smokeless tobacco: Never  Vaping Use   Vaping Use: Never used  Substance and Sexual Activity   Alcohol use: No   Drug use: Never    Comment: updated as of 09/06/2018 - pt reports no illegal drug use    Sexual activity: Not Currently  Other Topics Concern   Not on file  Social History Narrative   In [place] w/boyfriend   Works Chiropractor, Scientist, water quality   Social Determinants of Radio broadcast assistant Strain: Not on file  Food Insecurity: Not on file  Transportation Needs: No Transportation Needs   Lack of Transportation (Medical): No   Lack of Transportation (Non-Medical): No  Physical Activity: Not on file  Stress: Not on file  Social Connections: Not on file  Intimate Partner Violence: Not on file      BP 114/68    Pulse 89    Ht 4\' 10"  (  1.473 m)    Wt 154 lb (69.9 kg)    SpO2 94%    BMI 32.19 kg/m   Physical Exam:  Well appearing NAD HEENT: Unremarkable Neck:  No JVD, no thyromegally Lymphatics:  No adenopathy Back:  No CVA tenderness Lungs:  Clear with no wheezes HEART:  Regular rate rhythm, no murmurs, no rubs, no clicks Abd:  soft, positive bowel sounds, no organomegally, no rebound, no guarding Ext:  2 plus pulses, no edema, no cyanosis, no clubbing Skin:  No rashes no nodules Neuro:  CN II through XII intact, motor grossly intact  EKG - NSR  DEVICE  Normal device function.  See PaceArt for details.   Assess/Plan:  1. SVT - she has had occaisional episodes but mostly well controlled. No indication for catheter ablation or a change in medical therapy.  2. HTN - her bp is controlled. We will follow. She will continue the toprol.   Emily Overlie Sahir Tolson,MD

## 2021-04-14 NOTE — Patient Instructions (Signed)
Medication Instructions:  Your physician recommends that you continue on your current medications as directed. Please refer to the Current Medication list given to you today.  Labwork: None ordered.  Testing/Procedures: None ordered.  Follow-Up: Your physician wants you to follow-up in: one year with Gregg Taylor, MD or one of the following Advanced Practice Providers on your designated Care Team:   Renee Ursuy, PA-C Michael "Andy" Tillery, PA-C  Any Other Special Instructions Will Be Listed Below (If Applicable).  If you need a refill on your cardiac medications before your next appointment, please call your pharmacy.      

## 2021-04-15 ENCOUNTER — Other Ambulatory Visit (HOSPITAL_COMMUNITY): Payer: Self-pay

## 2021-04-20 ENCOUNTER — Other Ambulatory Visit: Payer: Self-pay | Admitting: *Deleted

## 2021-04-20 ENCOUNTER — Other Ambulatory Visit: Payer: Self-pay | Admitting: Hematology and Oncology

## 2021-04-20 MED ORDER — ONDANSETRON 8 MG PO TBDP: 8.0000 mg | ORAL_TABLET | Freq: Three times a day (TID) | ORAL | 1 refills | Status: DC | PRN

## 2021-04-22 ENCOUNTER — Other Ambulatory Visit (HOSPITAL_COMMUNITY): Payer: Self-pay

## 2021-04-23 ENCOUNTER — Inpatient Hospital Stay: Payer: Medicare Other

## 2021-04-23 ENCOUNTER — Inpatient Hospital Stay: Payer: Medicare Other | Attending: Medical

## 2021-04-23 ENCOUNTER — Other Ambulatory Visit: Payer: Self-pay

## 2021-04-23 ENCOUNTER — Inpatient Hospital Stay: Payer: Medicare Other | Admitting: Pharmacist

## 2021-04-23 VITALS — BP 140/77 | HR 105 | Temp 98.0°F | Resp 1 | Ht <= 58 in | Wt 157.1 lb

## 2021-04-23 DIAGNOSIS — Z79811 Long term (current) use of aromatase inhibitors: Secondary | ICD-10-CM | POA: Diagnosis not present

## 2021-04-23 DIAGNOSIS — Z9011 Acquired absence of right breast and nipple: Secondary | ICD-10-CM | POA: Diagnosis not present

## 2021-04-23 DIAGNOSIS — Z923 Personal history of irradiation: Secondary | ICD-10-CM | POA: Insufficient documentation

## 2021-04-23 DIAGNOSIS — Z170 Estrogen receptor positive status [ER+]: Secondary | ICD-10-CM

## 2021-04-23 DIAGNOSIS — C7951 Secondary malignant neoplasm of bone: Secondary | ICD-10-CM | POA: Insufficient documentation

## 2021-04-23 DIAGNOSIS — C50311 Malignant neoplasm of lower-inner quadrant of right female breast: Secondary | ICD-10-CM | POA: Diagnosis present

## 2021-04-23 DIAGNOSIS — Z79899 Other long term (current) drug therapy: Secondary | ICD-10-CM | POA: Insufficient documentation

## 2021-04-23 LAB — CBC WITH DIFFERENTIAL (CANCER CENTER ONLY)
Abs Immature Granulocytes: 0.02 10*3/uL (ref 0.00–0.07)
Basophils Absolute: 0 10*3/uL (ref 0.0–0.1)
Basophils Relative: 1 %
Eosinophils Absolute: 0.2 10*3/uL (ref 0.0–0.5)
Eosinophils Relative: 5 %
HCT: 41.1 % (ref 36.0–46.0)
Hemoglobin: 14 g/dL (ref 12.0–15.0)
Immature Granulocytes: 1 %
Lymphocytes Relative: 15 %
Lymphs Abs: 0.6 10*3/uL — ABNORMAL LOW (ref 0.7–4.0)
MCH: 32.3 pg (ref 26.0–34.0)
MCHC: 34.1 g/dL (ref 30.0–36.0)
MCV: 94.9 fL (ref 80.0–100.0)
Monocytes Absolute: 0.6 10*3/uL (ref 0.1–1.0)
Monocytes Relative: 15 %
Neutro Abs: 2.6 10*3/uL (ref 1.7–7.7)
Neutrophils Relative %: 63 %
Platelet Count: 293 10*3/uL (ref 150–400)
RBC: 4.33 MIL/uL (ref 3.87–5.11)
RDW: 12.4 % (ref 11.5–15.5)
WBC Count: 4.1 10*3/uL (ref 4.0–10.5)
nRBC: 0 % (ref 0.0–0.2)

## 2021-04-23 LAB — CMP (CANCER CENTER ONLY)
ALT: 15 U/L (ref 0–44)
AST: 15 U/L (ref 15–41)
Albumin: 3.9 g/dL (ref 3.5–5.0)
Alkaline Phosphatase: 98 U/L (ref 38–126)
Anion gap: 7 (ref 5–15)
BUN: 18 mg/dL (ref 6–20)
CO2: 24 mmol/L (ref 22–32)
Calcium: 9.4 mg/dL (ref 8.9–10.3)
Chloride: 109 mmol/L (ref 98–111)
Creatinine: 0.72 mg/dL (ref 0.44–1.00)
GFR, Estimated: >60 mL/min (ref >60)
Glucose, Bld: 155 mg/dL — ABNORMAL HIGH (ref 70–99)
Potassium: 4.1 mmol/L (ref 3.5–5.1)
Sodium: 140 mmol/L (ref 135–145)
Total Bilirubin: 0.4 mg/dL (ref 0.3–1.2)
Total Protein: 7.1 g/dL (ref 6.5–8.1)

## 2021-04-23 MED ORDER — DICLOFENAC SODIUM 75 MG PO TBEC: 75.0000 mg | DELAYED_RELEASE_TABLET | Freq: Two times a day (BID) | ORAL | 2 refills | Status: DC | PRN

## 2021-04-23 MED ORDER — DENOSUMAB 120 MG/1.7ML ~~LOC~~ SOLN
120.0000 mg | Freq: Once | SUBCUTANEOUS | Status: AC
  Administered 2021-04-23: 120 mg via SUBCUTANEOUS
  Filled 2021-04-23: qty 1.7

## 2021-04-23 NOTE — Progress Notes (Signed)
Emily Wilson       Telephone: [phone]?Fax: [phone]   Oncology Clinical Pharmacist Practitioner Initial Assessment  Emily Wilson is a 56 y.o. female with a diagnosis of breast cancer. They were contacted today via in-person visit.  Indication/Regimen Abemaciclib (Verzenio) is being used appropriately for treatment of metastatic breast cancer by Dr. [name].    Wt Readings from Last 1 Encounters:  04/23/21 157 lb 1.6 oz (71.3 kg)    Estimated body surface area is 1.71 meters squared as calculated from the following:   Height as of this encounter: 4\' 10"  (1.473 m).   Weight as of this encounter: 157 lb 1.6 oz (71.3 kg).  The dosing regimen is 50 mg by mouth every 12 hours on days 1 to 28 of a 28-day cycle. This is being given  in combination with letrozole and every 12 week denosumab 120 mg . It is planned to continue until disease progression or unacceptable toxicity.  Emily Wilson was seen today by clinical pharmacy after being referred by Dr. Lindi Adie for her management of abemaciclib.  She started the abemaciclib on February 27 and so far is tolerating it well.  She states that she has been on letrozole on and off since 2017 and she will be receiving every 12-week denosumab 120 mg which she has been on since July 2020.  She last saw Dr. Lindi Adie on February 16.  Possible side effects of abemaciclib were reviewed today with Emily Wilson which include but are not limited to diarrhea, neutropenia, pneumonitis, liver toxicity, blood clots, fatigue, alopecia, nausea, vomiting.  Emily Wilson states that she does have COPD and we reviewed specifically possible side effects of pneumonitis and to call the clinic at [phone] with any symptoms that may arise.  We also reviewed the CDC definition of fever and how manufacturing guidelines recommend following labs which is every 2 weeks for 2 months, followed by monthly for 2 months, and then as clinically indicated.  She last  had imaging on January 18 of this year.  We discussed that she potentially will have restaging scans sometimes in 3 months.  Ms. Peri states that orthopedics was following her with her diclofenac prescription but she will not be seeing them as frequently anymore.  Dr. Lindi Adie is okay with refilling the diclofenac prescription and this will be sent to her pharmacy of choice.  We also reviewed how to take her as needed medications should she have any side effects such as diarrhea or nausea.  She does have loperamide at home and she does have ondansetron ODT as well.   Dose Modifications 50 mg every 12 hours by mouth. Will increase as tolerated  Access Assessment Emily Wilson will be receiving abemaciclib through [facility] Concerns: none Start date if known: 04/20/21  Allergies Allergies  Allergen Reactions   Doxycycline Shortness Of Breath    Wheezing, shortness of breath, rash head to toe, and swelling   Naproxen Shortness Of Breath   Codeine Hives   Hydrocodone-Acetaminophen Hives   Ibuprofen     Irritant to stomach r/t diverticulitis   Lantus [Insulin Glargine]     Yeast Infections   Meloxicam Other (See Comments)    Abdominal pain    Propoxyphene N-Acetaminophen Hives   Sulfonamide Derivatives Hives   Tramadol Other (See Comments)    Abdominal Pain    Vitals Vitals with BMI 04/23/2021 04/14/2021 04/09/2021  Height 4\' 10"  4\' 10"  4\' 11"   Weight  157 lbs 2 oz 154 lbs 153 lbs 13 oz  BMI 32.84 27.78 24.23  Systolic 536 144 315  Diastolic 77 68 73  Pulse 400 89 98     Laboratory Data CBC EXTENDED Latest Ref Rng & Units 04/23/2021 04/09/2021 03/09/2021  WBC 4.0 - 10.5 K/uL 4.1 5.1 3.8(L)  RBC 3.87 - 5.11 MIL/uL 4.33 4.36 4.43  HGB 12.0 - 15.0 g/dL 14.0 14.1 14.4  HCT 36.0 - 46.0 % 41.1 41.6 42.5  PLT 150 - 400 K/uL 293 290 307  NEUTROABS 1.7 - 7.7 K/uL 2.6 3.4 2.5  LYMPHSABS 0.7 - 4.0 K/uL 0.6(L) 0.7 0.5(L)    CMP Latest Ref Rng & Units  04/23/2021 04/09/2021 03/09/2021  Glucose 70 - 99 mg/dL 155(H) 138(H) 104(H)  BUN 6 - 20 mg/dL 18 11 15   Creatinine 0.44 - 1.00 mg/dL 0.72 0.64 0.76  Sodium 135 - 145 mmol/L 140 137 139  Potassium 3.5 - 5.1 mmol/L 4.1 3.9 4.2  Chloride 98 - 111 mmol/L 109 105 105  CO2 22 - 32 mmol/L 24 24 27   Calcium 8.9 - 10.3 mg/dL 9.4 9.0 10.0  Total Protein 6.5 - 8.1 g/dL 7.1 7.0 7.4  Total Bilirubin 0.3 - 1.2 mg/dL 0.4 0.5 0.5  Alkaline Phos 38 - 126 U/L 98 83 95  AST 15 - 41 U/L 15 15 16   ALT 0 - 44 U/L 15 13 13    No results found for: MG   Contraindications Contraindications were reviewed? Yes Contraindications to therapy were identified? No   Safety Precautions The following safety precautions for the use of abemaciclib were reviewed:  Diarrhea: we reviewed that diarrhea is common with abemaciclib and confirmed that she does have loperamide (Imodium) at home.  We reviewed how to take this medication PRN and gave her information on abemaciclib Neutropenia: we discussed the importance of having a thermometer and what the Centers for Disease Control and Prevention (CDC) considers a fever which is 100.73F (38C) or higher.  Gave patient 24/7 triage line to call if any fevers or symptoms ILD/Pneumonitis: we reviewed potential symptoms including cough, shortness, and fatigue. Hepatotoxicity: reviewed to contact clinic for RUQ pain that will not subside, yellowing of eyes/skin Venous thromboembolism (VTE): reviewed signs of deep vein thrombosis (DVT) such as leg swelling, redness, pain, or tenderness and signs of pulmonary embolism (PE) such as shortness of breath, rapid or irregular heartbeat, cough, chest pain, or lightheadedness Reviewed to take the medication every 12 hours (with food sometimes can be easier on the stomach) and to take it at the same time every day. Discussed proper storage and handling of abemaciclib Storage and Handling  Medication Reconciliation Current Outpatient Medications   Medication Sig Dispense Refill   abemaciclib (VERZENIO) 50 MG tablet Take 1 tablet (50 mg total) by mouth 2 (two) times daily. Swallow tablets whole. Do not chew, crush, or split tablets before swallowing. 60 tablet 0   Accu-Chek FastClix Lancets MISC USE TO TEST BLOOD SUGAR UP TO FOUR TIMES DAILY AS DIRECTED 204 each 2   ACCU-CHEK GUIDE test strip USE TO CHECK BLOOD SUGAR UP TO 4 TIMES A DAY. DX: E11.65 200 strip 2   BD INSULIN SYRINGE U/F 31G X 5/16" 0.5 ML MISC USE TO ADMINISTER INSULIN 3 TIMES DAILY WITH MEALS 100 each 3   diclofenac (VOLTAREN) 75 MG EC tablet Take 75 mg by mouth 2 (two) times daily as needed.     gabapentin (NEURONTIN) 100 MG capsule Take 3 capsules (300 mg total)  by mouth 3 (three) times daily. 90 capsule 1   insulin NPH Human (HUMULIN N) 100 UNIT/ML injection INJECT 12 UNITS INTO THE SKIN AT BEDTIME 10 mL 1   insulin regular (NOVOLIN R) 100 units/mL injection Inject 0.12 mLs (12 Units total) into the skin 3 (three) times daily before meals. E.11.9. Hold dose for blood sugar less than 125 10 mL 11   letrozole (FEMARA) 2.5 MG tablet TAKE 1 TABLET BY MOUTH EVERY DAY 90 tablet 3   metoprolol succinate (TOPROL-XL) 25 MG 24 hr tablet Take 1 tablet (25 mg total) by mouth daily. 90 tablet 3   Milk Thistle 1000 MG CAPS Take 1,000 mg by mouth daily.     Multiple Vitamin (MULTIVITAMIN WITH MINERALS) TABS tablet Take 1 tablet by mouth daily.     HYDROcodone-acetaminophen (NORCO/VICODIN) 5-325 MG tablet Take 1 tablet by mouth 2 (two) times daily as needed. (Patient not taking: Reported on 04/23/2021)     ondansetron (ZOFRAN ODT) 8 MG disintegrating tablet Take 1 tablet (8 mg total) by mouth every 8 (eight) hours as needed for nausea or vomiting. (Patient not taking: Reported on 04/23/2021) 20 tablet 1   rosuvastatin (CRESTOR) 40 MG tablet Take 1 tablet (40 mg total) by mouth daily. 90 tablet 3   No current facility-administered medications for this visit.    Medication reconciliation is  based on the patient's most recent medication list in the electronic medical record (EMR) including herbal products and OTC medications.   The patient's medication list was reviewed today with the patient? Yes   Drug-drug interactions (DDIs) DDIs were evaluated? Yes Significant DDIs identified? No   Drug-Food Interactions Drug-food interactions were evaluated? Yes Drug-food interactions identified?  Avoid grapefruit products  Follow-up Plan  Denosumab 120 mg today.  Given every 12 weeks Continue on abemaciclib 50 mg every 12 hours Ms. Lover will bring in the dosage and frequency of her over-the-counter D3 supplement at her next appointment Labs, pharmacy clinic visit, on February 13 Restaging scans sometime at the end of April (last imaging March 11, 2021)  [name] participated in the discussion, expressed understanding, and voiced agreement with the above plan. All questions were answered to her satisfaction. The patient was advised to contact the clinic at [phone] with any questions or concerns prior to her return visit.   I spent 30 minutes assessing the patient.  [name], RPH-CPP, 04/23/2021 11:58 AM  **Disclaimer: This note was dictated with voice recognition software. Similar sounding words can inadvertently be transcribed and this note may contain transcription errors which may not have been corrected upon publication of note.**

## 2021-04-24 ENCOUNTER — Telehealth: Payer: Self-pay | Admitting: Pharmacist

## 2021-04-24 NOTE — Telephone Encounter (Signed)
Scheduled appointment per 03/02 los. Left message.  ?

## 2021-04-27 ENCOUNTER — Telehealth: Payer: Self-pay | Admitting: Pharmacist

## 2021-04-27 NOTE — Telephone Encounter (Signed)
Scheduled per 3/3 in basket, message has been left with pt ?

## 2021-04-30 ENCOUNTER — Other Ambulatory Visit: Payer: Self-pay

## 2021-04-30 ENCOUNTER — Ambulatory Visit
Admission: EM | Admit: 2021-04-30 | Discharge: 2021-04-30 | Disposition: A | Discharge status: Home / Self Care | Payer: Medicare Other | Attending: Physician Assistant | Admitting: Physician Assistant

## 2021-04-30 DIAGNOSIS — T451X5A Adverse effect of antineoplastic and immunosuppressive drugs, initial encounter: Secondary | ICD-10-CM | POA: Diagnosis not present

## 2021-04-30 DIAGNOSIS — K529 Noninfective gastroenteritis and colitis, unspecified: Secondary | ICD-10-CM

## 2021-04-30 LAB — POCT FASTING CBG KUC MANUAL ENTRY: POCT Glucose (KUC): 166 mg/dL — AB (ref 70–99)

## 2021-04-30 MED ORDER — MECLIZINE HCL 25 MG PO TABS: 25.0000 mg | ORAL_TABLET | Freq: Three times a day (TID) | ORAL | 0 refills | Status: DC | PRN

## 2021-04-30 NOTE — ED Provider Notes (Signed)
[name]    [name]: [id number] Arrival date & time: 04/30/21  0914      History   Chief Complaint Chief Complaint  Patient presents with   Vomiting    Diarrhea    Headache    HPI Emily Wilson is a 56 y.o. female.   Patient here today for evaluation of nausea, vomiting, diarrhea and headache that started about 6 days ago.  She initially thought this was due to medication she was just prescribed for chemo however patient reports that her son and husband also became ill with similar symptoms over the next few days and so she assumed that this was a viral illness.  She notes that they have improved significantly but she continues to have nausea and diarrhea.  She does report some vertigo..  She does have ongoing with her PCP tomorrow.  She also has follow-up with oncology in 4 days.  She reports she has not been taking chemo drug due to concerns that it may be making her nausea worse.  She did try to zofran without significant relief.   The history is provided by the patient.  Headache Associated symptoms: diarrhea, dizziness, nausea and vomiting   Associated symptoms: no abdominal pain and no fever    Past Medical History:  Diagnosis Date   Anxiety state, unspecified    Arthritis    Breast cancer of lower-inner quadrant of right female breast [facility])    Carpal tunnel syndrome    Complication of anesthesia    woke up during ganglion cyst removal in her 20's, not woken up since   Diabetes mellitus without complication (HCC)    Type II   Heart murmur    History of radiation therapy 12/18/18- 01/30/19   Right Chest wall 25 fractions X 2Gy each to total 50 Gy, followed by a boost 10 Gy in 5 fractions.    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, without mention of thyrotoxic crisis or storm     Patient Active Problem List   Diagnosis Date Noted   History of  shingles 01/09/2019   S/P mastectomy, right 11/16/2018   Bone metastasis ([place]) 05/31/2018   Family history of breast cancer in female 03/13/2015   Malignant neoplasm of lower-inner quadrant of right breast of female, estrogen receptor positive ([place]) 02/25/2015   DM (diabetes mellitus), type 2 ([place]) 02/25/2015   HLD (hyperlipidemia) 02/25/2015   Palpitations 01/26/2010   Anxiety state 05/26/2009   Hyperthyroidism 11/14/2006   Hypertension 11/14/2006    Past Surgical History:  Procedure Laterality Date   ABDOMINAL HYSTERECTOMY     APPENDECTOMY     CARPAL TUNNEL RELEASE     GANGLION CYST EXCISION Right    KNEE SURGERY Right 2007   ACL repair   TOTAL MASTECTOMY Right 11/16/2018   Procedure: RIGHT MASTECTOMY;  Surgeon: Coralie Keens, MD;  Location: [facility];  Service: General;  Laterality: Right;   TUBAL LIGATION      OB History   No obstetric history on file.      Home Medications    Prior to Admission medications   Medication Sig Start Date End Date Taking? Authorizing Provider  meclizine (ANTIVERT) 25 MG tablet Take 1 tablet (25 mg total) by mouth 3 (three) times daily as needed for dizziness. 04/30/21  Yes Francene Finders, PA-C  abemaciclib (VERZENIO) 50  MG tablet Take 1 tablet (50 mg total) by mouth 2 (two) times daily. Swallow tablets whole. Do not chew, crush, or split tablets before swallowing. 04/09/21   [name], MD  Accu-Chek FastClix Lancets MISC USE TO TEST BLOOD SUGAR UP TO FOUR TIMES DAILY AS DIRECTED 06/30/20   [name], MD  ACCU-CHEK GUIDE test strip USE TO CHECK BLOOD SUGAR UP TO 4 TIMES A DAY. DX: E11.65 12/25/20   Dorna Mai, MD  BD INSULIN SYRINGE U/F 31G X 5/16" 0.5 ML MISC USE TO ADMINISTER INSULIN 3 TIMES DAILY WITH MEALS 01/07/21   Dorna Mai, MD  diclofenac (VOLTAREN) 75 MG EC tablet Take 1 tablet (75 mg total) by mouth 2 (two) times daily as needed. 04/23/21   [name], MD  gabapentin (NEURONTIN) 100 MG capsule Take 3  capsules (300 mg total) by mouth 3 (three) times daily. 02/06/19   [name], MD  HYDROcodone-acetaminophen (NORCO/VICODIN) 5-325 MG tablet Take 1 tablet by mouth 2 (two) times daily as needed. Patient not taking: Reported on 04/23/2021 10/21/20   [provider]  insulin NPH Human (HUMULIN N) 100 UNIT/ML injection INJECT 12 UNITS INTO THE SKIN AT BEDTIME 07/23/20   [name], MD  insulin regular (NOVOLIN R) 100 units/mL injection Inject 0.12 mLs (12 Units total) into the skin 3 (three) times daily before meals. E.11.9. Hold dose for blood sugar less than 125 11/11/20   Dorna Mai, MD  letrozole [facility]) 2.5 MG tablet TAKE 1 TABLET BY MOUTH EVERY DAY 10/30/20   [name], MD  loperamide (IMODIUM) 2 MG capsule Take 2 mg by mouth as needed for diarrhea or loose stools. Take 2 tabs (4 mg) with first loose stool, then 1 tab (2 mg) with each additional loose stool. Do not take more than 8 tabs (16 mg) in a 24-hour period.    [name], MD  metoprolol succinate (TOPROL-XL) 25 MG 24 hr tablet Take 1 tablet (25 mg total) by mouth daily. 04/14/21   [name], MD  Milk Thistle 1000 MG CAPS Take 1,000 mg by mouth daily.    [provider]  Multiple Vitamin (MULTIVITAMIN WITH MINERALS) TABS tablet Take 1 tablet by mouth daily.    [provider]  ondansetron (ZOFRAN ODT) 8 MG disintegrating tablet Take 1 tablet (8 mg total) by mouth every 8 (eight) hours as needed for nausea or vomiting. Patient not taking: Reported on 04/23/2021 04/20/21   [name], MD  rosuvastatin (CRESTOR) 40 MG tablet Take 1 tablet (40 mg total) by mouth daily. 06/30/20   [name], MD    Family History Family History  Problem Relation Age of Onset   Diabetes Mother    Bladder Cancer Mother 89       smoker   Other Mother 61       TAH for unspecified reason   Multiple sclerosis Mother    Cancer Father 35       lymphatic/tonsil cancer - in remission; former  smoker   COPD Maternal Grandmother        smoker   Emphysema Maternal Grandmother        smoker   Stroke Maternal Grandfather    Dementia Maternal Uncle    Diabetes Paternal Grandmother    COPD Maternal Uncle        smoker   Multiple sclerosis Paternal Aunt    Breast cancer Paternal Aunt        dx. late 38s - early  71s    Social History Social History   Tobacco Use   Smoking status: Former    Packs/day: 3.00    Years: 30.00    Pack years: 90.00    Types: Cigarettes    Quit date: 10/24/2011    Years since quitting: 9.5   Smokeless tobacco: Never  Vaping Use   Vaping Use: Never used  Substance Use Topics   Alcohol use: No   Drug use: Never    Comment: updated as of 09/06/2018 - pt reports no illegal drug use      Allergies   Doxycycline, Naproxen, Codeine, Hydrocodone-acetaminophen, Ibuprofen, Lantus [insulin glargine], Meloxicam, Propoxyphene n-acetaminophen, Sulfonamide derivatives, and Tramadol   Review of Systems Review of Systems  Constitutional:  Negative for chills and fever.  Eyes:  Negative for discharge and redness.  Respiratory:  Negative for shortness of breath.   Gastrointestinal:  Positive for diarrhea, nausea and vomiting. Negative for abdominal pain.  Genitourinary:  Positive for vaginal bleeding and vaginal discharge.  Neurological:  Positive for dizziness and headaches.    Physical Exam Triage Vital Signs ED Triage Vitals  Enc Vitals Group     BP 04/30/21 0932 132/82     Pulse Rate 04/30/21 0931 97     Resp 04/30/21 0931 16     Temp 04/30/21 0931 98.3 F (36.8 C)     Temp Source 04/30/21 0931 Oral     SpO2 04/30/21 0931 100 %     Weight --      Height --      Head Circumference --      Peak Flow --      Pain Score --      Pain Loc --      Pain Edu? --      Excl. in [place]? --    No data found.  Updated Vital Signs BP 132/82    Pulse 97    Temp 98.3 F (36.8 C) (Oral)    Resp 16    SpO2 100%      Physical Exam Vitals and nursing  note reviewed.  Constitutional:      General: She is not in acute distress.    Appearance: Normal appearance. She is not ill-appearing.  HENT:     Head: Normocephalic and atraumatic.  Eyes:     Conjunctiva/sclera: Conjunctivae normal.  Cardiovascular:     Rate and Rhythm: Normal rate and regular rhythm.  Pulmonary:     Effort: Pulmonary effort is normal. No respiratory distress.     Breath sounds: Normal breath sounds. No wheezing, rhonchi or rales.  Neurological:     Mental Status: She is alert.  Psychiatric:        Mood and Affect: Mood normal.        Behavior: Behavior normal.        Thought Content: Thought content normal.     UC Treatments / Results  Labs (all labs ordered are listed, but only abnormal results are displayed) Labs Reviewed  POCT FASTING CBG [facility] MANUAL ENTRY - Abnormal; Notable for the following components:      Result Value   POCT Glucose ([facility]) 166 (*)    All other components within normal limits  CBC WITH DIFFERENTIAL/PLATELET  COMPREHENSIVE METABOLIC PANEL    EKG   Radiology No results found.  Procedures Procedures (including critical care time)  Medications Ordered in UC Medications - No data to display  Initial Impression / Assessment and Plan / UC Course  I have reviewed the triage vital signs and the nursing notes.  Pertinent labs & imaging results that were available during my care of the patient were reviewed by me and considered in my medical decision making (see chart for details).    Suspect likely viral etiology of symptoms, will order labs for further evaluation.  Strongly recommended follow-up with PCP regarding as scheduled.  Meclizine prescribed for vertigo.  Encouraged to continue with Pedialyte for hydration.  Recommend ED with any worsening symptoms.  Final Clinical Impressions(s) / UC Diagnoses   Final diagnoses:  Gastroenteritis   Discharge Instructions   None    ED Prescriptions     Medication Sig Dispense  Auth. Provider   meclizine (ANTIVERT) 25 MG tablet Take 1 tablet (25 mg total) by mouth 3 (three) times daily as needed for dizziness. 30 tablet Francene Finders, PA-C      PDMP not reviewed this encounter.   Francene Finders, PA-C 04/30/21 1037

## 2021-04-30 NOTE — ED Triage Notes (Signed)
Pt presents with vomiting and diarrhea for several days. She reports starting a new chemo medication, that she has not been able to keep down. ?

## 2021-05-01 ENCOUNTER — Ambulatory Visit (INDEPENDENT_AMBULATORY_CARE_PROVIDER_SITE_OTHER): Payer: Medicare Other

## 2021-05-01 ENCOUNTER — Telehealth: Payer: Self-pay

## 2021-05-01 DIAGNOSIS — Z0000 Encounter for general adult medical examination without abnormal findings: Secondary | ICD-10-CM

## 2021-05-01 LAB — CBC WITH DIFFERENTIAL/PLATELET
Basophils Absolute: 0 10*3/uL (ref 0.0–0.2)
Basos: 1 %
EOS (ABSOLUTE): 0.2 10*3/uL (ref 0.0–0.4)
Eos: 4 %
Hematocrit: 40.4 % (ref 34.0–46.6)
Hemoglobin: 13.7 g/dL (ref 11.1–15.9)
Immature Grans (Abs): 0 10*3/uL (ref 0.0–0.1)
Immature Granulocytes: 0 %
Lymphocytes Absolute: 0.6 10*3/uL — ABNORMAL LOW (ref 0.7–3.1)
Lymphs: 12 %
MCH: 32.2 pg (ref 26.6–33.0)
MCHC: 33.9 g/dL (ref 31.5–35.7)
MCV: 95 fL (ref 79–97)
Monocytes Absolute: 0.5 10*3/uL (ref 0.1–0.9)
Monocytes: 10 %
Neutrophils Absolute: 3.8 10*3/uL (ref 1.4–7.0)
Neutrophils: 73 %
Platelets: 332 10*3/uL (ref 150–450)
RBC: 4.25 x10E6/uL (ref 3.77–5.28)
RDW: 12.1 % (ref 11.7–15.4)
WBC: 5.1 10*3/uL (ref 3.4–10.8)

## 2021-05-01 LAB — COMPREHENSIVE METABOLIC PANEL
ALT: 15 IU/L (ref 0–32)
AST: 17 IU/L (ref 0–40)
Albumin/Globulin Ratio: 1.8 (ref 1.2–2.2)
Albumin: 4.3 g/dL (ref 3.8–4.9)
Alkaline Phosphatase: 131 IU/L — ABNORMAL HIGH (ref 44–121)
BUN/Creatinine Ratio: 13 (ref 9–23)
BUN: 8 mg/dL (ref 6–24)
Bilirubin Total: <0.2 mg/dL (ref 0.0–1.2)
CO2: 21 mmol/L (ref 20–29)
Calcium: 9.3 mg/dL (ref 8.7–10.2)
Chloride: 105 mmol/L (ref 96–106)
Creatinine, Ser: 0.61 mg/dL (ref 0.57–1.00)
Globulin, Total: 2.4 g/dL (ref 1.5–4.5)
Glucose: 129 mg/dL — ABNORMAL HIGH (ref 70–99)
Potassium: 4.8 mmol/L (ref 3.5–5.2)
Sodium: 145 mmol/L — ABNORMAL HIGH (ref 134–144)
Total Protein: 6.7 g/dL (ref 6.0–8.5)
eGFR: 106 mL/min/{1.73_m2} (ref >59)

## 2021-05-01 NOTE — Telephone Encounter (Signed)
error 

## 2021-05-01 NOTE — Progress Notes (Signed)
Subjective:   Emily Wilson is a 56 y.o. female who presents for an Initial Medicare Annual Wellness Visit.  I connected with  [name] on 05/01/21 by a audio enabled telemedicine application and verified that I am speaking with the correct person using two identifiers.  Patient Location: Home  Provider Location: Office/Clinic  I discussed the limitations of evaluation and management by telemedicine. The patient expressed understanding and agreed to proceed.      Objective:    Today's Vitals   05/01/21 1401  PainSc: 4    There is no height or weight on file to calculate BMI.  Advanced Directives 05/01/2021 01/07/2021 11/18/2020 12/05/2018 11/10/2018 11/11/2015 03/12/2015  Does Patient Have a Medical Advance Directive? No No No No No No No  Does patient want to make changes to medical advance directive? No - Patient declined - - - - - -  Would patient like information on creating a medical advance directive? No - Patient declined No - Patient declined No - Patient declined No - Patient declined Yes (MAU/Ambulatory/Procedural Areas - Information given) - Yes - Educational materials given    Current Medications (verified) Outpatient Encounter Medications as of 05/01/2021  Medication Sig   abemaciclib (VERZENIO) 50 MG tablet Take 1 tablet (50 mg total) by mouth 2 (two) times daily. Swallow tablets whole. Do not chew, crush, or split tablets before swallowing.   Accu-Chek FastClix Lancets MISC USE TO TEST BLOOD SUGAR UP TO FOUR TIMES DAILY AS DIRECTED   ACCU-CHEK GUIDE test strip USE TO CHECK BLOOD SUGAR UP TO 4 TIMES A DAY. DX: E11.65   BD INSULIN SYRINGE U/F 31G X 5/16" 0.5 ML MISC USE TO ADMINISTER INSULIN 3 TIMES DAILY WITH MEALS   diclofenac (VOLTAREN) 75 MG EC tablet Take 1 tablet (75 mg total) by mouth 2 (two) times daily as needed.   gabapentin (NEURONTIN) 100 MG capsule Take 3 capsules (300 mg total) by mouth 3 (three) times daily.   HYDROcodone-acetaminophen  (NORCO/VICODIN) 5-325 MG tablet Take 1 tablet by mouth 2 (two) times daily as needed.   insulin NPH Human (HUMULIN N) 100 UNIT/ML injection INJECT 12 UNITS INTO THE SKIN AT BEDTIME   insulin regular (NOVOLIN R) 100 units/mL injection Inject 0.12 mLs (12 Units total) into the skin 3 (three) times daily before meals. E.11.9. Hold dose for blood sugar less than 125   letrozole (FEMARA) 2.5 MG tablet TAKE 1 TABLET BY MOUTH EVERY DAY   loperamide (IMODIUM) 2 MG capsule Take 2 mg by mouth as needed for diarrhea or loose stools. Take 2 tabs (4 mg) with first loose stool, then 1 tab (2 mg) with each additional loose stool. Do not take more than 8 tabs (16 mg) in a 24-hour period.   meclizine (ANTIVERT) 25 MG tablet Take 1 tablet (25 mg total) by mouth 3 (three) times daily as needed for dizziness.   metoprolol succinate (TOPROL-XL) 25 MG 24 hr tablet Take 1 tablet (25 mg total) by mouth daily.   Milk Thistle 1000 MG CAPS Take 1,000 mg by mouth daily.   Multiple Vitamin (MULTIVITAMIN WITH MINERALS) TABS tablet Take 1 tablet by mouth daily.   ondansetron (ZOFRAN ODT) 8 MG disintegrating tablet Take 1 tablet (8 mg total) by mouth every 8 (eight) hours as needed for nausea or vomiting.   rosuvastatin (CRESTOR) 40 MG tablet Take 1 tablet (40 mg total) by mouth daily.   No facility-administered encounter medications on file as of 05/01/2021.  Allergies (verified) Doxycycline, Naproxen, Codeine, Hydrocodone-acetaminophen, Ibuprofen, Lantus [insulin glargine], Meloxicam, Propoxyphene n-acetaminophen, Sulfonamide derivatives, and Tramadol   History: Past Medical History:  Diagnosis Date   Anxiety state, unspecified    Arthritis    Breast cancer of lower-inner quadrant of right female breast ([place])    Carpal tunnel syndrome    Complication of anesthesia    woke up during ganglion cyst removal in her 20's, not woken up since   Diabetes mellitus without complication ([place])    Type II   Heart murmur     History of radiation therapy 12/18/18- 01/30/19   Right Chest wall 25 fractions X 2Gy each to total 50 Gy, followed by a boost 10 Gy in 5 fractions.    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, without mention of thyrotoxic crisis or storm    Past Surgical History:  Procedure Laterality Date   ABDOMINAL HYSTERECTOMY     APPENDECTOMY     CARPAL TUNNEL RELEASE     GANGLION CYST EXCISION Right    KNEE SURGERY Right 2007   ACL repair   TOTAL MASTECTOMY Right 11/16/2018   Procedure: RIGHT MASTECTOMY;  Surgeon: Coralie Keens, MD;  Location: [facility];  Service: General;  Laterality: Right;   TUBAL LIGATION     Family History  Problem Relation Age of Onset   Diabetes Mother    Bladder Cancer Mother 87       smoker   Other Mother 50       TAH for unspecified reason   Multiple sclerosis Mother    Cancer Father 29       lymphatic/tonsil cancer - in remission; former smoker   COPD Maternal Grandmother        smoker   Emphysema Maternal Grandmother        smoker   Stroke Maternal Grandfather    Dementia Maternal Uncle    Diabetes Paternal Grandmother    COPD Maternal Uncle        smoker   Multiple sclerosis Paternal Aunt    Breast cancer Paternal Aunt        dx. late 97s - early 60s   Social History   Socioeconomic History   Marital status: Divorced    Spouse name: Not on file   Number of children: Not on file   Years of education: Not on file   Highest education level: Not on file  Occupational History   Occupation: Scientist, water quality  Tobacco Use   Smoking status: Former    Packs/day: 3.00    Years: 30.00    Pack years: 90.00    Types: Cigarettes    Quit date: 10/24/2011    Years since quitting: 9.5   Smokeless tobacco: Never  Vaping Use   Vaping Use: Never used  Substance and Sexual Activity   Alcohol use: No   Drug use: Never   Sexual activity: Not Currently  Other  Topics Concern   Not on file  Social History Narrative   In [place] w/boyfriend   Works Chiropractor, Scientist, water quality   Social Determinants of Radio broadcast assistant Strain: Medium Risk   Difficulty of Paying Living Expenses: Somewhat hard  Food Insecurity: No Food Insecurity   Worried About Charity fundraiser in the Last Year: Never true   Arboriculturist in the Last Year: Never true  Transportation Needs: No Data processing manager (Medical): No   Lack of Transportation (Non-Medical): No  Physical Activity: Inactive   Days of Exercise per Week: 0 days   Minutes of Exercise per Session: 0 min  Stress: Stress Concern Present   Feeling of Stress : Very much  Social Connections: Socially Isolated   Frequency of Communication with Friends and Family: More than three times a week   Frequency of Social Gatherings with Friends and Family: More than three times a week   Attends Religious Services: Never   Marine scientist or Organizations: No   Attends Music therapist: Never   Marital Status: Divorced    Tobacco Counseling Counseling given: Not Answered   Clinical Intake:  Pre-visit preparation completed: Yes Pain : 0-10 Pain Score: 4  (stomach) Pain Type: Acute pain Pain Location: Abdomen Pain Orientation: Upper, Lower Pain Onset: In the past 7 days Pain Frequency: Once a week      Diabetic?YES         Activities of Daily Living In your present state of health, do you have any difficulty performing the following activities: 05/01/2021  Hearing? Y  Vision? N  Difficulty concentrating or making decisions? N  Walking or climbing stairs? Y  Dressing or bathing? N  Doing errands, shopping? N  Preparing Food and eating ? N  Using the Toilet? N  In the past six months, have you accidently leaked urine? N  Do you have problems with loss of bowel control? N  Managing your Medications? N  Managing your Finances? N  Housekeeping or  managing your Housekeeping? N  Some recent data might be hidden    Patient Care Team: Dorna Mai, MD as PCP - General (Family Medicine)  Indicate any recent Medical Services you may have received from other than [facility] providers in the past year (date may be approximate).     Assessment:   This is a routine wellness examination for Denine.  Hearing/Vision screen No results found.  Dietary issues and exercise activities discussed:     Goals Addressed             This Visit's Progress    Follow up with Primary Care Provider        Depression Screen [facility] 2/9 Scores 05/01/2021 05/01/2021 01/28/2021 11/11/2020 06/30/2020 04/01/2020 12/25/2019  PHQ - 2 Score 0 0 0 0 0 0 0  PHQ- 9 Score - - 8 2 0 0 -    Fall Risk Fall Risk  05/01/2021 04/01/2020 03/08/2018 11/04/2015 03/12/2015  Falls in the past year? 1 0 0 No No  Number falls in past yr: 0 0 - - -  Injury with Fall? 0 0 - - -    FALL RISK PREVENTION PERTAINING TO THE HOME:  Any stairs in or around the home? Yes  If so, are there any without handrails? Yes  Home free of loose throw rugs in walkways, pet beds, electrical cords, etc? Yes  Adequate lighting in your home to reduce risk of falls? Yes   ASSISTIVE DEVICES UTILIZED TO PREVENT FALLS:  Life alert? No  Use of a cane, walker or w/c? No  Grab bars in the bathroom? No  Shower chair or bench in shower? No  Elevated toilet seat or a handicapped toilet? No    Cognitive Function:     6CIT Screen 05/01/2021  What Year? 0 points  What month? 0 points  What time? 0 points  Count back from 20 0 points  Months in reverse 0 points  Repeat phrase 0 points  Total Score 0    Immunizations Immunization History  Administered Date(s) Administered   Pneumococcal Polysaccharide-23 01/04/2005, 12/05/2012, 03/16/2018   Td 07/26/2005   Tdap 03/16/2018    TDAP status: Up to date  Flu Vaccine status: Declined, Education has been provided regarding the importance of this vaccine  but patient still declined. Advised may receive this vaccine at local pharmacy or Health Dept. Aware to provide a copy of the vaccination record if obtained from local pharmacy or Health Dept. Verbalized acceptance and understanding.  Pneumococcal vaccine status: Up to date  Covid-19 vaccine status: Declined, Education has been provided regarding the importance of this vaccine but patient still declined. Advised may receive this vaccine at local pharmacy or Health Dept.or vaccine clinic. Aware to provide a copy of the vaccination record if obtained from local pharmacy or Health Dept. Verbalized acceptance and understanding.  Qualifies for Shingles Vaccine? No   Zostavax completed No   Shingrix Completed?: No.    Education has been provided regarding the importance of this vaccine. Patient has been advised to call insurance company to determine out of pocket expense if they have not yet received this vaccine. Advised may also receive vaccine at local pharmacy or Health Dept. Verbalized acceptance and understanding.  Screening Tests Health Maintenance  Topic Date Due   COVID-19 Vaccine (1) Never done   Zoster Vaccines- Shingrix (1 of 2) Never done   OPHTHALMOLOGY EXAM  10/29/2016   FOOT EXAM  12/24/2020   MAMMOGRAM  08/21/2021 (Originally 05/16/2020)   HEMOGLOBIN A1C  07/29/2021   URINE MICROALBUMIN  11/11/2021   Fecal DNA (Cologuard)  01/30/2022   TETANUS/TDAP  03/16/2028   Hepatitis C Screening  Completed   HIV Screening  Completed   HPV VACCINES  Aged Out    Health Maintenance  Health Maintenance Due  Topic Date Due   COVID-19 Vaccine (1) Never done   Zoster Vaccines- Shingrix (1 of 2) Never done   OPHTHALMOLOGY EXAM  10/29/2016   FOOT EXAM  12/24/2020    Colorectal cancer screening: Type of screening: Cologuard. Completed 2022. Repeat every 3 years  Mammogram status: Completed SCHEDULED NEXT MONTH. Repeat every year    Lung Cancer Screening: (Low Dose CT Chest recommended  if Age 1-80 years, 30 pack-year currently smoking OR have quit w/in 15years.) does not qualify.   Lung Cancer Screening Referral: NA  Additional Screening:  Hepatitis C Screening: does not qualify; Completed 10/11/2019  Vision Screening: Recommended annual ophthalmology exams for early detection of glaucoma and other disorders of the eye. Is the patient up to date with their annual eye exam?  Yes  Who is the provider or what is the name of the office in which the patient attends annual eye exams? [facility] If pt is not established with a provider, would they like to be referred to a provider to establish care?  NA .   Dental Screening: Recommended annual dental exams for proper oral hygiene  Community Resource Referral / Chronic Care Management: CRR required this visit?  No   CCM required this visit?  No      Plan:     I have personally reviewed and noted the following in the patients chart:   Medical and social history Use of alcohol, tobacco or illicit drugs  Current medications and supplements including opioid prescriptions. Patient is not currently taking opioid prescriptions. Functional ability and status  Nutritional status Physical activity Advanced directives List of other physicians Hospitalizations, surgeries, and ER visits in previous 12 months Vitals Screenings to include cognitive, depression, and falls Referrals and appointments  In addition, I have reviewed and discussed with patient certain preventive protocols, quality metrics, and best practice recommendations. A written personalized care plan for preventive services as well as general preventive health recommendations were provided to patient.     Octavio Manns   05/01/2021

## 2021-05-01 NOTE — Patient Instructions (Signed)

## 2021-05-04 ENCOUNTER — Inpatient Hospital Stay: Payer: Medicare Other | Admitting: Pharmacist

## 2021-05-04 ENCOUNTER — Other Ambulatory Visit: Payer: Self-pay

## 2021-05-04 ENCOUNTER — Other Ambulatory Visit: Payer: Medicare Other

## 2021-05-04 ENCOUNTER — Inpatient Hospital Stay: Payer: Medicare Other

## 2021-05-04 ENCOUNTER — Ambulatory Visit: Payer: Medicare Other | Admitting: Pharmacist

## 2021-05-04 VITALS — BP 125/75 | HR 90 | Temp 97.5°F | Resp 18 | Ht <= 58 in | Wt 152.0 lb

## 2021-05-04 DIAGNOSIS — C50311 Malignant neoplasm of lower-inner quadrant of right female breast: Secondary | ICD-10-CM

## 2021-05-04 DIAGNOSIS — C7951 Secondary malignant neoplasm of bone: Secondary | ICD-10-CM | POA: Diagnosis not present

## 2021-05-04 DIAGNOSIS — Z170 Estrogen receptor positive status [ER+]: Secondary | ICD-10-CM

## 2021-05-04 LAB — CMP (CANCER CENTER ONLY)
ALT: 13 U/L (ref 0–44)
AST: 12 U/L — ABNORMAL LOW (ref 15–41)
Albumin: 3.9 g/dL (ref 3.5–5.0)
Alkaline Phosphatase: 98 U/L (ref 38–126)
Anion gap: 8 (ref 5–15)
BUN: 13 mg/dL (ref 6–20)
CO2: 25 mmol/L (ref 22–32)
Calcium: 9.1 mg/dL (ref 8.9–10.3)
Chloride: 103 mmol/L (ref 98–111)
Creatinine: 0.69 mg/dL (ref 0.44–1.00)
GFR, Estimated: >60 mL/min (ref >60)
Glucose, Bld: 116 mg/dL — ABNORMAL HIGH (ref 70–99)
Potassium: 4.1 mmol/L (ref 3.5–5.1)
Sodium: 136 mmol/L (ref 135–145)
Total Bilirubin: 0.7 mg/dL (ref 0.3–1.2)
Total Protein: 7.2 g/dL (ref 6.5–8.1)

## 2021-05-04 LAB — CBC WITH DIFFERENTIAL (CANCER CENTER ONLY)
Abs Immature Granulocytes: 0.06 10*3/uL (ref 0.00–0.07)
Basophils Absolute: 0.1 10*3/uL (ref 0.0–0.1)
Basophils Relative: 1 %
Eosinophils Absolute: 0.2 10*3/uL (ref 0.0–0.5)
Eosinophils Relative: 2 %
HCT: 39.7 % (ref 36.0–46.0)
Hemoglobin: 13.8 g/dL (ref 12.0–15.0)
Immature Granulocytes: 1 %
Lymphocytes Relative: 10 %
Lymphs Abs: 0.9 10*3/uL (ref 0.7–4.0)
MCH: 32.8 pg (ref 26.0–34.0)
MCHC: 34.8 g/dL (ref 30.0–36.0)
MCV: 94.3 fL (ref 80.0–100.0)
Monocytes Absolute: 1.2 10*3/uL — ABNORMAL HIGH (ref 0.1–1.0)
Monocytes Relative: 14 %
Neutro Abs: 6.1 10*3/uL (ref 1.7–7.7)
Neutrophils Relative %: 72 %
Platelet Count: 350 10*3/uL (ref 150–400)
RBC: 4.21 MIL/uL (ref 3.87–5.11)
RDW: 12.2 % (ref 11.5–15.5)
WBC Count: 8.5 10*3/uL (ref 4.0–10.5)
nRBC: 0 % (ref 0.0–0.2)

## 2021-05-04 NOTE — Progress Notes (Signed)
[place]       Telephone: [phone]?Fax: [phone]   Oncology Clinical Pharmacist Practitioner Progress Note  Emily Wilson was contacted via in-person to discuss her chemotherapy regimen for abemaciclib which they receive under the care of Dr. [name].  Current treatment regimen and start date Abemaciclib (04/20/21) Letrozole (08/31/18) Denosumab 120 mg (09/07/18)  Interval History She continues to HOLD abemaciclib 50 mg by mouth every 12 hours on days 1 to 28 of a 28-day cycle. This is being given  in combination with letrozole and denosumab 120 mg  . Therapy is planned to continue until disease progression or unacceptable toxicity.   Response to Therapy Emily Wilson was seen today by clinical pharmacy for scheduled follow-up for her management of abemaciclib.  She reports feeling sick starting on Thursday of last week.  She describes stomach pain, diarrhea and vomiting.  She reported to the ED and they diagnosed her with gastritis possibly from a viral origin.  Today, she states that she is feeling somewhat better although she is still having some stomach pain.  She has not vomited per her report since Saturday.  She has been drinking Pedialyte as needed.  Her electrolytes and labs are overall unremarkable today.  She reports stopping the abemaciclib with her last dose being Saturday morning, March 11.  She did try to take the evening dose on March 11 but threw that up.  We discussed possible options with restarting abemaciclib, Emily Wilson prefers to continue to hold abemaciclib until all of her GI symptoms have resolved.  We felt that this was reasonable.  She will contact Dr. Geralyn Flash clinic at [phone] to communicate when she restarts abemaciclib.  We will then order labs 2 weeks after that date per manufacturing guideline recommendations.  Emily Wilson stated that she has been trying to get a hold of her primary care physician, Dr. Dorna Mai.  Emily Wilson  feels that she may need a GI consult.  Clinical pharmacy stated that we would forward our note from today to Dr. Lindi Adie and Dr Redmond Pulling to make them aware of our visit today. Labs, vitals, treatment parameters, and manufacturer guidelines assessing toxicity were reviewed with Emily Wilson today. Based on these values, patient is in agreement to HOLD abemaciclib therapy at this time.  Allergies Allergies  Allergen Reactions   Doxycycline Shortness Of Breath    Wheezing, shortness of breath, rash head to toe, and swelling   Naproxen Shortness Of Breath   Codeine Hives   Hydrocodone-Acetaminophen Hives   Ibuprofen     Irritant to stomach r/t diverticulitis   Lantus [Insulin Glargine]     Yeast Infections   Meloxicam Other (See Comments)    Abdominal pain    Propoxyphene N-Acetaminophen Hives   Sulfonamide Derivatives Hives   Tramadol Other (See Comments)    Abdominal Pain    Vitals Vitals with BMI 05/04/2021 04/30/2021 04/23/2021  Height '4\' 10"'$  - '4\' 10"'$   Weight 152 lbs - 157 lbs 2 oz  BMI 08.67 - 61.95  Systolic 093 267 124  Diastolic 75 82 77  Pulse 90 97 105   Temp Readings from Last 3 Encounters:  05/04/21 (!) 97.5 F (36.4 C) (Tympanic)  04/30/21 98.3 F (36.8 C) (Oral)  04/23/21 98 F (36.7 C) (Tympanic)     Laboratory Data CBC EXTENDED Latest Ref Rng & Units 05/04/2021 04/30/2021 04/23/2021  WBC 4.0 - 10.5 K/uL 8.5 5.1 4.1  RBC 3.87 - 5.11 MIL/uL 4.21  4.25 4.33  HGB 12.0 - 15.0 g/dL 13.8 13.7 14.0  HCT 36.0 - 46.0 % 39.7 40.4 41.1  PLT 150 - 400 K/uL 350 332 293  NEUTROABS 1.7 - 7.7 K/uL 6.1 3.8 2.6  LYMPHSABS 0.7 - 4.0 K/uL 0.9 0.6(L) 0.6(L)    CMP Latest Ref Rng & Units 05/04/2021 04/30/2021 04/23/2021  Glucose 70 - 99 mg/dL 116(H) 129(H) 155(H)  BUN 6 - 20 mg/dL '13 8 18  '$ Creatinine 0.44 - 1.00 mg/dL 0.69 0.61 0.72  Sodium 135 - 145 mmol/L 136 145(H) 140  Potassium 3.5 - 5.1 mmol/L 4.1 4.8 4.1  Chloride 98 - 111 mmol/L 103 105 109  CO2 22 - 32 mmol/L '25 21 24   '$ Calcium 8.9 - 10.3 mg/dL 9.1 9.3 9.4  Total Protein 6.5 - 8.1 g/dL 7.2 6.7 7.1  Total Bilirubin 0.3 - 1.2 mg/dL 0.7 <0.2 0.4  Alkaline Phos 38 - 126 U/L 98 131(H) 98  AST 15 - 41 U/L 12(L) 17 15  ALT 0 - 44 U/L '13 15 15    '$ No results found for: MG  Adverse Effects Assessment Diarrhea, vomiting, stomach pain: likely from infectious source and Emily Wilson reports both her son and boyfriend came down with similar symptoms  Adherence Assessment Emily Wilson reports missing 5 doses over the past 1 weeks due to stomach infection.   Reason for missed dose: not feeling well and having N/V/D Patient was re-educated on importance of adherence.   Access Assessment Emily Wilson is currently receiving her abemaciclib through [facility] concerns:  none  Medication Reconciliation The patient's medication list was reviewed today with the patient? Yes New medications or herbal supplements have recently been started? Yes , meclizine for Vertigo Any medications have been discontinued? No  The medication list was updated and reconciled based on the patient's most recent medication list in the electronic medical record (EMR) including herbal products and OTC medications.   Medications Current Outpatient Medications  Medication Sig Dispense Refill   abemaciclib (VERZENIO) 50 MG tablet Take 1 tablet (50 mg total) by mouth 2 (two) times daily. Swallow tablets whole. Do not chew, crush, or split tablets before swallowing. 60 tablet 0   Accu-Chek FastClix Lancets MISC USE TO TEST BLOOD SUGAR UP TO FOUR TIMES DAILY AS DIRECTED 204 each 2   ACCU-CHEK GUIDE test strip USE TO CHECK BLOOD SUGAR UP TO 4 TIMES A DAY. DX: E11.65 200 strip 2   BD INSULIN SYRINGE U/F 31G X 5/16" 0.5 ML MISC USE TO ADMINISTER INSULIN 3 TIMES DAILY WITH MEALS 100 each 3   diclofenac (VOLTAREN) 75 MG EC tablet Take 1 tablet (75 mg total) by mouth 2 (two) times daily as needed. 60 tablet 2    gabapentin (NEURONTIN) 100 MG capsule Take 3 capsules (300 mg total) by mouth 3 (three) times daily. 90 capsule 1   HYDROcodone-acetaminophen (NORCO/VICODIN) 5-325 MG tablet Take 1 tablet by mouth 2 (two) times daily as needed.     insulin NPH Human (HUMULIN N) 100 UNIT/ML injection INJECT 12 UNITS INTO THE SKIN AT BEDTIME 10 mL 1   insulin regular (NOVOLIN R) 100 units/mL injection Inject 0.12 mLs (12 Units total) into the skin 3 (three) times daily before meals. E.11.9. Hold dose for blood sugar less than 125 10 mL 11   letrozole (FEMARA) 2.5 MG tablet TAKE 1 TABLET BY MOUTH EVERY DAY 90 tablet 3   loperamide (IMODIUM) 2 MG capsule Take 2 mg  by mouth as needed for diarrhea or loose stools. Take 2 tabs (4 mg) with first loose stool, then 1 tab (2 mg) with each additional loose stool. Do not take more than 8 tabs (16 mg) in a 24-hour period.     meclizine (ANTIVERT) 25 MG tablet Take 1 tablet (25 mg total) by mouth 3 (three) times daily as needed for dizziness. 30 tablet 0   metoprolol succinate (TOPROL-XL) 25 MG 24 hr tablet Take 1 tablet (25 mg total) by mouth daily. 90 tablet 3   Milk Thistle 1000 MG CAPS Take 1,000 mg by mouth daily.     Multiple Vitamin (MULTIVITAMIN WITH MINERALS) TABS tablet Take 1 tablet by mouth daily.     ondansetron (ZOFRAN ODT) 8 MG disintegrating tablet Take 1 tablet (8 mg total) by mouth every 8 (eight) hours as needed for nausea or vomiting. 20 tablet 1   rosuvastatin (CRESTOR) 40 MG tablet Take 1 tablet (40 mg total) by mouth daily. 90 tablet 3   No current facility-administered medications for this visit.    Drug-Drug Interactions (DDIs) DDIs were evaluated? Yes Significant DDIs?  Discussed flagged interactions. Emily Wilson states she is able to take  The patient was instructed to speak with their health care provider and/or the oral chemotherapy pharmacist before starting any new drug, including prescription or over the counter, natural / herbal products, or  vitamins.  Supportive Care Currently holding abemaciclib as above  Dosing Assessment Hepatic adjustments needed? No  Renal adjustments needed? No  Toxicity adjustments needed? No  The current dosing regimen is not appropriate to continue at this time. Continuing to hold until GI symptoms resolve. She will contact clinic when she restarts. She hopes sometime later this week.  Follow-Up Plan Continue to hold abemaciclib. Will contact clinic once restarts. Tentatively add labs/pharmacy clinic for 3 weeks from today Emily Wilson states needs assistance getting appointment with PCP Dr. Dorna Mai rather soon if possible. Has something scheduled for June. Patient trying to get referral for GI specialist. Clinical pharmacy stated they would forward visit note from today to Dr. Lindi Adie and Dr. Redmond Pulling so they are aware of Emily Wilson's request  Emily Wilson participated in the discussion, expressed understanding, and voiced agreement with the above plan. All questions were answered to her satisfaction. The patient was advised to contact the clinic at [phone] with any questions or concerns prior to her return visit.   I spent 30 minutes assessing and educating the patient.  [name], RPH-CPP, 05/04/2021  11:14 AM   **Disclaimer: This note was dictated with voice recognition software. Similar sounding words can inadvertently be transcribed and this note may contain transcription errors which may not have been corrected upon publication of note.**

## 2021-05-05 ENCOUNTER — Telehealth: Payer: Self-pay | Admitting: Family Medicine

## 2021-05-05 ENCOUNTER — Other Ambulatory Visit (HOSPITAL_COMMUNITY): Payer: Self-pay

## 2021-05-05 NOTE — Telephone Encounter (Signed)
Pt called requesting a referral to a GI doctor. She said that she had radiation back in Nov. And started having diarrhea shortly after. She was told that she had diverticulitis and is continuing to have stomach pain and diarrhea. Please advise if pt needs to be see in clinic, or if a referral can be placed to GI. ?

## 2021-05-07 ENCOUNTER — Other Ambulatory Visit (HOSPITAL_COMMUNITY): Payer: Self-pay

## 2021-05-11 ENCOUNTER — Telehealth: Payer: Self-pay | Admitting: Pharmacist

## 2021-05-11 ENCOUNTER — Other Ambulatory Visit (HOSPITAL_COMMUNITY): Payer: Self-pay

## 2021-05-11 NOTE — Telephone Encounter (Signed)
Pt has appt for 03/29, has mild pain with diverticulitis and she is looking for referral for GI ?

## 2021-05-11 NOTE — Telephone Encounter (Signed)
[place]  ?     Telephone: [phone]?Fax: [phone]  ? ?Oncology Clinical Pharmacist Practitioner Progress Note ? ?Emily Wilson is a 56 y.o. female with a diagnosis of breast cancer currently on abemaciclib under the care of Dr. [name]. They were contacted today via in-person. ? ?Interval History ?Emily Wilson contacted clinical pharmacy via telephone today.  We had last spoken to her on March 13 and at that time she had stated that she had been holding abemaciclib since March 11.  This was due to some stomach pain as previously dictated.  She had presented to the ED and during her last visit she stated that she was potentially trying to get her GI referral from her primary care physician.  Today she says that she has an appointment with her PCP in about 9 days.  She states that she is continuing to improve with her GI symptoms but still feeling that at times her stomach feels "knotted".  Emily Wilson states that she restarted the abemaciclib this morning.  She knows to contact the clinic immediately and to stop the abemaciclib should her GI symptoms worsen.  She is hopeful that she will be able to see a GI specialist if her PCP feels this is reasonable.  She has a follow-up appointment with labs with clinical pharmacy tentatively on April 3.  Per her request, we will also let the oral chemotherapy pharmacist know about her restart date for abemaciclib. ? ?Follow-Up Plan ?See PCP Dr. Redmond Pulling on 05/20/21. She may refer her to GI specialist based on her assessment ?Labs with pharmacy clinic visit on 05/25/21 ?Restarted abemaciclib this morning at same dose, 50 mg every 12 hours.  She knows to hold abemaciclib should GI symptoms worsen and contact the clinic immediately ? ?Emily Wilson participated in the discussion, expressed understanding, and voiced agreement with the above plan. All questions were answered to her satisfaction. The patient was advised to contact the clinic at [phone] with any questions or concerns prior to her return visit. ? ?Clinical pharmacy will continue to support Emily Wilson and Dr. [name] as needed. ? ?I spent 15 minutes assessing the patient. ? ?Emily Wilson, RPH-CPP,  ?05/11/2021  12:12 PM  ? ?

## 2021-05-12 ENCOUNTER — Other Ambulatory Visit: Payer: Self-pay | Admitting: Family Medicine

## 2021-05-12 ENCOUNTER — Ambulatory Visit: Payer: Medicare Other | Admitting: Family Medicine

## 2021-05-12 DIAGNOSIS — K2960 Other gastritis without bleeding: Secondary | ICD-10-CM

## 2021-05-14 ENCOUNTER — Other Ambulatory Visit (HOSPITAL_COMMUNITY): Payer: Self-pay

## 2021-05-14 ENCOUNTER — Other Ambulatory Visit: Payer: Self-pay | Admitting: Hematology and Oncology

## 2021-05-15 ENCOUNTER — Encounter: Payer: Self-pay | Admitting: Hematology and Oncology

## 2021-05-15 MED ORDER — ABEMACICLIB 50 MG PO TABS
50.0000 mg | ORAL_TABLET | Freq: Two times a day (BID) | ORAL | 2 refills | Status: DC
  Filled 2021-05-19: qty 56, 28d supply, fill #0
  Filled 2021-06-09: qty 56, 28d supply, fill #1
  Filled 2021-06-30: qty 56, 28d supply, fill #2

## 2021-05-15 NOTE — Telephone Encounter (Signed)
Good morning Emily Wilson.  Can you please review and refill if necessary.  Thanks!  ?

## 2021-05-15 NOTE — Telephone Encounter (Signed)
I was off yesterday and I am not sure who Magda Paganini is.  It is an automated request we got from [facility] and they did not put any reason as to the changes requested- the only note they added was "other change" ?

## 2021-05-19 ENCOUNTER — Other Ambulatory Visit (HOSPITAL_COMMUNITY): Payer: Self-pay

## 2021-05-20 ENCOUNTER — Ambulatory Visit (INDEPENDENT_AMBULATORY_CARE_PROVIDER_SITE_OTHER): Payer: Medicare Other | Admitting: Family Medicine

## 2021-05-20 ENCOUNTER — Encounter: Payer: Self-pay | Admitting: Family Medicine

## 2021-05-20 ENCOUNTER — Other Ambulatory Visit: Payer: Self-pay

## 2021-05-20 VITALS — BP 138/81 | HR 99 | Temp 98.1°F | Resp 16 | Wt 150.8 lb

## 2021-05-20 DIAGNOSIS — K3189 Other diseases of stomach and duodenum: Secondary | ICD-10-CM | POA: Diagnosis not present

## 2021-05-20 DIAGNOSIS — R1310 Dysphagia, unspecified: Secondary | ICD-10-CM

## 2021-05-20 MED ORDER — DICYCLOMINE HCL 20 MG PO TABS: 20.0000 mg | ORAL_TABLET | Freq: Four times a day (QID) | ORAL | 0 refills | Status: DC

## 2021-05-20 NOTE — Progress Notes (Signed)
Patient is here for a referral to a GI doctor. Patient has been having issues for awhile and would like to see a GI doctor  ?

## 2021-05-21 ENCOUNTER — Encounter: Payer: Self-pay | Admitting: Family Medicine

## 2021-05-21 NOTE — Progress Notes (Signed)
? ?Established Patient Office Visit ? ?Subjective:  ?Patient ID: Emily Wilson, female    DOB: [birth year]  Age: 56 y.o. MRN: [id number] ? ?CC:  ?Chief Complaint  ?Patient presents with  ? Gastroesophageal Reflux  ?  Referral   ? ? ?HPI ?Emily Wilson presents for difficulty swallowing and gastric spasms. These sx seemed to have started during and after her treatments for cancer.  ? ?Past Medical History:  ?Diagnosis Date  ? Anxiety state, unspecified   ? Arthritis   ? Breast cancer of lower-inner quadrant of right female breast ([place])   ? Carpal tunnel syndrome   ? Complication of anesthesia   ? woke up during ganglion cyst removal in her 20's, not woken up since  ? 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 Heart murmur   ? History of radiation therapy 12/18/18- 01/30/19  ? Right Chest wall 25 fractions X 2Gy each to total 50 Gy, followed by a boost 10 Gy in 5 fractions.   ? Hyperlipidemia   ? Hyperthyroidism   ? Mitral valve prolapse   ? Smoker   ? Tachycardia   ? 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 ? ?Past Surgical History:  ?Procedure Laterality Date  ? ABDOMINAL HYSTERECTOMY    ? APPENDECTOMY    ? CARPAL TUNNEL RELEASE    ? GANGLION CYST EXCISION Right   ? KNEE SURGERY Right 2007  ? ACL repair  ? TOTAL MASTECTOMY Right 11/16/2018  ? Procedure: RIGHT MASTECTOMY;  Surgeon: Coralie Keens, MD;  Location: [facility];  Service: General;  Laterality: Right;  ? TUBAL LIGATION    ? ? ?Family History  ?Problem Relation Age of Onset  ? Diabetes Mother   ? Bladder Cancer Mother 59  ?     smoker  ? Other Mother 81  ?     TAH for unspecified reason  ? Multiple sclerosis Mother   ? Cancer Father 48  ?     lymphatic/tonsil cancer - in remission; former smoker  ? COPD Maternal Grandmother   ?     smoker  ? Emphysema Maternal Grandmother   ?     smoker  ? Stroke Maternal Grandfather   ? Dementia Maternal Uncle   ? Diabetes Paternal  Grandmother   ? COPD Maternal Uncle   ?     smoker  ? Multiple sclerosis Paternal Aunt   ? Breast cancer Paternal Aunt   ?     dx. late 63s - early 41s  ? ? ?Social History  ? ?Socioeconomic History  ? Marital status: Divorced  ?  Spouse name: Not on file  ? Number of children: Not on file  ? Years of education: Not on file  ? Highest education level: Not on file  ?Occupational History  ? Occupation: Scientist, water quality  ?Tobacco Use  ? Smoking status: Former  ?  Packs/day: 3.00  ?  Years: 30.00  ?  Pack years: 90.00  ?  Types: Cigarettes  ?  Quit date: 10/24/2011  ?  Years since quitting: 9.5  ? Smokeless tobacco: Never  ?Vaping Use  ? Vaping Use: Never used  ?Substance and Sexual Activity  ? Alcohol use: No  ? Drug use: Never  ? Sexual activity: Not Currently  ?Other Topics Concern  ? Not on file  ?Social History Narrative  ? In LTR w/boyfriend  ? Works Chiropractor, Scientist, water quality  ? ?  Social Determinants of Health  ? ?Financial Resource Strain: Medium Risk  ? Difficulty of Paying Living Expenses: Somewhat hard  ?Food Insecurity: No Food Insecurity  ? Worried About Charity fundraiser in the Last Year: Never true  ? Ran Out of Food in the Last Year: Never true  ?Transportation Needs: No Transportation Needs  ? Lack of Transportation (Medical): No  ? Lack of Transportation (Non-Medical): No  ?Physical Activity: Inactive  ? Days of Exercise per Week: 0 days  ? Minutes of Exercise per Session: 0 min  ?Stress: Stress Concern Present  ? Feeling of Stress : Very much  ?Social Connections: Socially Isolated  ? Frequency of Communication with Friends and Family: More than three times a week  ? Frequency of Social Gatherings with Friends and Family: More than three times a week  ? Attends Religious Services: Never  ? Active Member of Clubs or Organizations: No  ? Attends Archivist Meetings: Never  ? Marital Status: Divorced  ?Intimate Partner Violence: Not At Risk  ? Fear of Current or Ex-Partner: No  ? Emotionally Abused: No  ?  Physically Abused: No  ? Sexually Abused: No  ? ? ?ROS ?Review of Systems  ?HENT:  Positive for trouble swallowing.   ?Gastrointestinal:  Positive for abdominal pain. Negative for constipation and diarrhea.  ?All other systems reviewed and are negative. ? ?Objective:  ? ?Today's Vitals: BP 138/81   Pulse 99   Temp 98.1 ?F (36.7 ?C) (Oral)   Resp 16   Wt 150 lb 12.8 oz (68.4 kg)   SpO2 95%   BMI 31.52 kg/m?  ? ?Physical Exam ?Vitals and nursing note reviewed.  ?Constitutional:   ?   General: She is not in acute distress. ?Neck:  ?   Thyroid: No thyromegaly.  ?Cardiovascular:  ?   Rate and Rhythm: Normal rate and regular rhythm.  ?Pulmonary:  ?   Effort: Pulmonary effort is normal.  ?   Breath sounds: Normal breath sounds.  ?Abdominal:  ?   Palpations: Abdomen is soft.  ?   Tenderness: There is no abdominal tenderness.  ?Musculoskeletal:  ?   Cervical back: Normal range of motion and neck supple.  ?Neurological:  ?   General: No focal deficit present.  ?   Mental Status: She is alert and oriented to person, place, and time.  ?Psychiatric:     ?   Mood and Affect: Mood normal.     ?   Behavior: Behavior normal.  ? ? ?Assessment & Plan:  ? ?1. Dysphagia, unspecified type ?Referral to GI for further eval/mgt ?- Ambulatory referral to Gastroenterology ? ?2. Spasm of GI tract ?Bentyl prescribed. Referral to consultant as above ?- Ambulatory referral to Gastroenterology ? ? ? ?Outpatient Encounter Medications as of 05/20/2021  ?Medication Sig  ? abemaciclib (VERZENIO) 50 MG tablet Take 1 tablet (50 mg total) by mouth 2 (two) times daily. Swallow tablets whole. Do not chew, crush, or split tablets before swallowing.  ? Accu-Chek FastClix Lancets MISC USE TO TEST BLOOD SUGAR UP TO FOUR TIMES DAILY AS DIRECTED  ? ACCU-CHEK GUIDE test strip USE TO CHECK BLOOD SUGAR UP TO 4 TIMES A DAY. DX: E11.65  ? BD INSULIN SYRINGE U/F 31G X 5/16" 0.5 ML MISC USE TO ADMINISTER INSULIN 3 TIMES DAILY WITH MEALS  ? diclofenac (VOLTAREN) 75  MG EC tablet Take 1 tablet (75 mg total) by mouth 2 (two) times daily as needed.  ? dicyclomine (BENTYL) 20  MG tablet Take 1 tablet (20 mg total) by mouth every 6 (six) hours.  ? gabapentin (NEURONTIN) 100 MG capsule Take 3 capsules (300 mg total) by mouth 3 (three) times daily.  ? HYDROcodone-acetaminophen (NORCO/VICODIN) 5-325 MG tablet Take 1 tablet by mouth 2 (two) times daily as needed.  ? insulin NPH Human (HUMULIN N) 100 UNIT/ML injection INJECT 12 UNITS INTO THE SKIN AT BEDTIME  ? insulin regular (NOVOLIN R) 100 units/mL injection Inject 0.12 mLs (12 Units total) into the skin 3 (three) times daily before meals. E.11.9. Hold dose for blood sugar less than 125  ? letrozole (FEMARA) 2.5 MG tablet TAKE 1 TABLET BY MOUTH EVERY DAY  ? loperamide (IMODIUM) 2 MG capsule Take 2 mg by mouth as needed for diarrhea or loose stools. Take 2 tabs (4 mg) with first loose stool, then 1 tab (2 mg) with each additional loose stool. Do not take more than 8 tabs (16 mg) in a 24-hour period.  ? meclizine (ANTIVERT) 25 MG tablet Take 1 tablet (25 mg total) by mouth 3 (three) times daily as needed for dizziness.  ? metoprolol succinate (TOPROL-XL) 25 MG 24 hr tablet Take 1 tablet (25 mg total) by mouth daily.  ? Milk Thistle 1000 MG CAPS Take 1,000 mg by mouth daily.  ? Multiple Vitamin (MULTIVITAMIN WITH MINERALS) TABS tablet Take 1 tablet by mouth daily.  ? ondansetron (ZOFRAN ODT) 8 MG disintegrating tablet Take 1 tablet (8 mg total) by mouth every 8 (eight) hours as needed for nausea or vomiting.  ? rosuvastatin (CRESTOR) 40 MG tablet Take 1 tablet (40 mg total) by mouth daily.  ? [DISCONTINUED] letrozole (FEMARA) 2.5 MG tablet Take by mouth.  ? ?No facility-administered encounter medications on file as of 05/20/2021.  ? ? ?Follow-up: No follow-ups on file.  ? ?Becky Sax, MD ? ?

## 2021-05-24 ENCOUNTER — Encounter: Payer: Self-pay | Admitting: Hematology and Oncology

## 2021-05-25 ENCOUNTER — Inpatient Hospital Stay: Payer: Medicare Other | Attending: Medical

## 2021-05-25 ENCOUNTER — Inpatient Hospital Stay: Payer: Medicare Other | Admitting: Pharmacist

## 2021-05-25 ENCOUNTER — Other Ambulatory Visit: Payer: Self-pay

## 2021-05-25 VITALS — BP 125/86 | HR 102 | Temp 97.9°F | Resp 18 | Ht <= 58 in | Wt 152.0 lb

## 2021-05-25 DIAGNOSIS — Z79811 Long term (current) use of aromatase inhibitors: Secondary | ICD-10-CM | POA: Insufficient documentation

## 2021-05-25 DIAGNOSIS — Z79899 Other long term (current) drug therapy: Secondary | ICD-10-CM | POA: Insufficient documentation

## 2021-05-25 DIAGNOSIS — Z923 Personal history of irradiation: Secondary | ICD-10-CM | POA: Insufficient documentation

## 2021-05-25 DIAGNOSIS — C7951 Secondary malignant neoplasm of bone: Secondary | ICD-10-CM | POA: Diagnosis present

## 2021-05-25 DIAGNOSIS — C50311 Malignant neoplasm of lower-inner quadrant of right female breast: Secondary | ICD-10-CM | POA: Diagnosis present

## 2021-05-25 DIAGNOSIS — Z170 Estrogen receptor positive status [ER+]: Secondary | ICD-10-CM | POA: Diagnosis not present

## 2021-05-25 DIAGNOSIS — Z9011 Acquired absence of right breast and nipple: Secondary | ICD-10-CM | POA: Diagnosis not present

## 2021-05-25 LAB — CBC WITH DIFFERENTIAL (CANCER CENTER ONLY)
Abs Immature Granulocytes: 0.03 10*3/uL (ref 0.00–0.07)
Basophils Absolute: 0.1 10*3/uL (ref 0.0–0.1)
Basophils Relative: 2 %
Eosinophils Absolute: 0.3 10*3/uL (ref 0.0–0.5)
Eosinophils Relative: 8 %
HCT: 42.1 % (ref 36.0–46.0)
Hemoglobin: 14 g/dL (ref 12.0–15.0)
Immature Granulocytes: 1 %
Lymphocytes Relative: 18 %
Lymphs Abs: 0.7 10*3/uL (ref 0.7–4.0)
MCH: 31.9 pg (ref 26.0–34.0)
MCHC: 33.3 g/dL (ref 30.0–36.0)
MCV: 95.9 fL (ref 80.0–100.0)
Monocytes Absolute: 0.7 10*3/uL (ref 0.1–1.0)
Monocytes Relative: 17 %
Neutro Abs: 2.2 10*3/uL (ref 1.7–7.7)
Neutrophils Relative %: 54 %
Platelet Count: 331 10*3/uL (ref 150–400)
RBC: 4.39 MIL/uL (ref 3.87–5.11)
RDW: 12.4 % (ref 11.5–15.5)
WBC Count: 4 10*3/uL (ref 4.0–10.5)
nRBC: 0 % (ref 0.0–0.2)

## 2021-05-25 LAB — CMP (CANCER CENTER ONLY)
ALT: 13 U/L (ref 0–44)
AST: 15 U/L (ref 15–41)
Albumin: 4 g/dL (ref 3.5–5.0)
Alkaline Phosphatase: 88 U/L (ref 38–126)
Anion gap: 8 (ref 5–15)
BUN: 12 mg/dL (ref 6–20)
CO2: 24 mmol/L (ref 22–32)
Calcium: 9.4 mg/dL (ref 8.9–10.3)
Chloride: 107 mmol/L (ref 98–111)
Creatinine: 0.78 mg/dL (ref 0.44–1.00)
GFR, Estimated: >60 mL/min (ref >60)
Glucose, Bld: 166 mg/dL — ABNORMAL HIGH (ref 70–99)
Potassium: 4.7 mmol/L (ref 3.5–5.1)
Sodium: 139 mmol/L (ref 135–145)
Total Bilirubin: 0.3 mg/dL (ref 0.3–1.2)
Total Protein: 7.7 g/dL (ref 6.5–8.1)

## 2021-05-25 NOTE — Progress Notes (Signed)
[place]  ?     Telephone: [phone]?Fax: [phone]  ? ?Oncology Clinical Pharmacist Practitioner Progress Note ? ?Emily Wilson was contacted via in-person to discuss her chemotherapy regimen for abemaciclib which they receive under the care of Dr. [name]. ? ?Current treatment regimen and start date ?Abemaciclib (04/20/21) ?Letrozole (08/31/18) ?Denosumab 120 mg (09/07/18) ? ?Interval History ?She continues on abemaciclib 50 mg by mouth every 12 hours on days 1 to 28 of a 28-day cycle. This is being given  in combination with letrozole and denosumab. Denosumab due next in June . Therapy is planned to continue until disease progression or unacceptable toxicity.  ? ?Response to Therapy ?Emily Wilson was seen today by clinical pharmacy as follow-up to her abemaciclib management.  She continues on abemaciclib 50 mg every 12 hours.  She restarted this around March 20 after stopping due to a viral GI illness.  She saw her PCP Dr. Redmond Pulling last week and she has been referred to a GI specialist.  This is due to some difficulty swallowing when she eats and spasms that she is reporting.  We discussed that while abemaciclib can cause some GI cramping and diarrhea, the incidence is not very high at all in the reported literature for the symptoms that she is describing.  We did discuss that abemaciclib can cause different side effects and different people.  Dr. Redmond Pulling did prescribe Emily Wilson dicyclomine for her spasms.  Emily Wilson states that she has not seen much relief as of yet.  Emily Wilson does have restaging scans ordered for the end of this month and she will see Dr. Lindi Adie in early May to review the scans with labs.  We went over several options with Ms. Krauter today which included holding the abemaciclib until she sees the GI doctor or Dr. Lindi Adie or continuing to take the abemaciclib with the hopes that the dicyclomine will start to take effect.  After careful consideration, Emily Wilson  does prefer to continue the abemaciclib at this time.  She knows that if she continues to have difficulty swallowing due to the spasms or the symptoms get any worse, she can always stop the abemaciclib before seeing the GI specialist or Dr. Lindi Adie. [facility] will be sending refill of abemaciclib in the coming week. ? ?Emily Wilson does report mild diarrhea and nausea but no vomiting.  The nausea and diarrhea have been controlled.  Emily Wilson continues to use Pepto-Bismol as needed along with loperamide.  She also states that she uses different ginger teas for the nausea which are successful. Labs, vitals, treatment parameters, and manufacturer guidelines assessing toxicity were reviewed with Emily Wilson today. Based on these values, patient is in agreement to continue abemaciclib therapy at this time. ? ?Allergies ?Allergies  ?Allergen Reactions  ? Doxycycline Shortness Of Breath  ?  Wheezing, shortness of breath, rash head to toe, and swelling  ? Naproxen Shortness Of Breath  ? Codeine Hives  ? Hydrocodone-Acetaminophen Hives  ? Ibuprofen   ?  Irritant to stomach r/t diverticulitis  ? Lantus [Insulin Glargine]   ?  Yeast Infections  ? Meloxicam Other (See Comments)  ?  Abdominal pain ?  ? Propoxyphene N-Acetaminophen Hives  ? Sulfonamide Derivatives Hives  ? Tramadol Other (See Comments)  ?  Abdominal Pain  ? ? ?Vitals ? ?  05/25/2021  ?  9:39 AM 05/20/2021  ?  3:09 PM 05/04/2021  ? 10:49 AM  ?Vitals with BMI  ?Height  $'4\' 10"'W$   '4\' 10"'$   ?Weight 152 lbs 150 lbs 13 oz 152 lbs  ?BMI 31.78 31.53 31.78  ?Systolic 468 032 122  ?Diastolic 86 81 75  ?Pulse 102 99 90  ?  ? ?Laboratory Data ? ?  Latest Ref Rng & Units 05/25/2021  ?  8:58 AM 05/04/2021  ?  9:47 AM 04/30/2021  ? 10:33 AM  ?CBC EXTENDED  ?WBC 4.0 - 10.5 K/uL 4.0   8.5   5.1    ?RBC 3.87 - 5.11 MIL/uL 4.39   4.21   4.25    ?Hemoglobin 12.0 - 15.0 g/dL 14.0   13.8   13.7    ?HCT 36.0 - 46.0 % 42.1   39.7   40.4    ?Platelets 150 - 400 K/uL 331   350   332    ?NEUT# 1.7 - 7.7  K/uL 2.2   6.1   3.8    ?Lymph# 0.7 - 4.0 K/uL 0.7   0.9   0.6    ?  ? ?  Latest Ref Rng & Units 05/25/2021  ?  8:58 AM 05/04/2021  ?  9:47 AM 04/30/2021  ? 10:33 AM  ?CMP  ?Glucose 70 - 99 mg/dL 166   116   129    ?BUN 6 - 20 mg/dL '12   13   8    '$ ?Creatinine 0.44 - 1.00 mg/dL 0.78   0.69   0.61    ?Sodium 135 - 145 mmol/L 139   136   145    ?Potassium 3.5 - 5.1 mmol/L 4.7   4.1   4.8    ?Chloride 98 - 111 mmol/L 107   103   105    ?CO2 22 - 32 mmol/L '24   25   21    '$ ?Calcium 8.9 - 10.3 mg/dL 9.4   9.1   9.3    ?Total Protein 6.5 - 8.1 g/dL 7.7   7.2   6.7    ?Total Bilirubin 0.3 - 1.2 mg/dL 0.3   0.7   <0.2    ?Alkaline Phos 38 - 126 U/L 88   98   131    ?AST 15 - 41 U/L '15   12   17    '$ ?ALT 0 - 44 U/L '13   13   15    '$ ?  ?No results found for: MG ? ?Adverse Effects Assessment ?Difficulty swallowing at times and GI spasms: Dr. Redmond Pulling has referred her to GI specialist and prescribed dicyclomine. While always possible, it is less likely that these symptoms are attributed to abemaciclib ?Diarrhea: controlled with loperamide ?Nausea: controlled with ginger teas and anti-nausea medications as needed ?Flatulence: discussed possible trial of simethicone ? ?Adherence Assessment ?Emily Wilson reports missing 0 doses over the past 4 weeks.   ?Reason for missed dose: N/A ?Patient was re-educated on importance of adherence.  ? ?Access Assessment ?Emily Wilson is currently receiving her abemaciclib through [facility]  ?Insurance concerns:  none ? ?Medication Reconciliation ?The patient's medication list was reviewed today with the patient? Yes ?New medications or herbal supplements have recently been started? Yes , dicyclomine ?Any medications have been discontinued? No  ?The medication list was updated and reconciled based on the patient's most recent medication list in the electronic medical record (EMR) including herbal products and OTC medications.  ? ?Medications ?Current Outpatient Medications   ?Medication Sig Dispense Refill  ? abemaciclib (VERZENIO) 50 MG tablet Take 1 tablet (  50 mg total) by mouth 2 (two) times daily. Swallow tablets whole. Do not chew, crush, or split tablets before swallowing. 56 tablet 2  ? Accu-Chek FastClix Lancets MISC USE TO TEST BLOOD SUGAR UP TO FOUR TIMES DAILY AS DIRECTED 204 each 2  ? ACCU-CHEK GUIDE test strip USE TO CHECK BLOOD SUGAR UP TO 4 TIMES A DAY. DX: E11.65 200 strip 2  ? BD INSULIN SYRINGE U/F 31G X 5/16" 0.5 ML MISC USE TO ADMINISTER INSULIN 3 TIMES DAILY WITH MEALS 100 each 3  ? diclofenac (VOLTAREN) 75 MG EC tablet Take 1 tablet (75 mg total) by mouth 2 (two) times daily as needed. 60 tablet 2  ? dicyclomine (BENTYL) 20 MG tablet Take 1 tablet (20 mg total) by mouth every 6 (six) hours. 120 tablet 0  ? gabapentin (NEURONTIN) 100 MG capsule Take 3 capsules (300 mg total) by mouth 3 (three) times daily. 90 capsule 1  ? HYDROcodone-acetaminophen (NORCO/VICODIN) 5-325 MG tablet Take 1 tablet by mouth 2 (two) times daily as needed.    ? insulin NPH Human (HUMULIN N) 100 UNIT/ML injection INJECT 12 UNITS INTO THE SKIN AT BEDTIME 10 mL 1  ? insulin regular (NOVOLIN R) 100 units/mL injection Inject 0.12 mLs (12 Units total) into the skin 3 (three) times daily before meals. E.11.9. Hold dose for blood sugar less than 125 10 mL 11  ? letrozole (FEMARA) 2.5 MG tablet TAKE 1 TABLET BY MOUTH EVERY DAY 90 tablet 3  ? loperamide (IMODIUM) 2 MG capsule Take 2 mg by mouth as needed for diarrhea or loose stools. Take 2 tabs (4 mg) with first loose stool, then 1 tab (2 mg) with each additional loose stool. Do not take more than 8 tabs (16 mg) in a 24-hour period.    ? meclizine (ANTIVERT) 25 MG tablet Take 1 tablet (25 mg total) by mouth 3 (three) times daily as needed for dizziness. 30 tablet 0  ? metoprolol succinate (TOPROL-XL) 25 MG 24 hr tablet Take 1 tablet (25 mg total) by mouth daily. 90 tablet 3  ? Milk Thistle 1000 MG CAPS Take 1,000 mg by mouth daily.    ? Multiple  Vitamin (MULTIVITAMIN WITH MINERALS) TABS tablet Take 1 tablet by mouth daily.    ? ondansetron (ZOFRAN ODT) 8 MG disintegrating tablet Take 1 tablet (8 mg total) by mouth every 8 (eight) hours as needed f

## 2021-05-26 ENCOUNTER — Other Ambulatory Visit (HOSPITAL_COMMUNITY): Payer: Self-pay

## 2021-05-26 ENCOUNTER — Telehealth: Payer: Self-pay | Admitting: Pharmacist

## 2021-05-26 NOTE — Telephone Encounter (Signed)
Scheduled appointment per 04/03 los. Patient aware. ?

## 2021-05-27 ENCOUNTER — Encounter: Payer: Self-pay | Admitting: Gastroenterology

## 2021-06-06 IMAGING — NM NUCLEAR MEDICINE WHOLE BODY BONE SCINTIGRAPHY
2 series · 2 of 2 positions shown · non-contrast
Comparison: 05/23/2018

Correlation: CT chest abdomen pelvis 05/23/2018

CLINICAL DATA: RIGHT breast cancer, low back pain, RIGHT shoulder
pain, RIGHT heel pain, former smoker

EXAM:
NUCLEAR MEDICINE WHOLE BODY BONE SCAN
TECHNIQUE: Whole body anterior and posterior images were obtained approximately
3 hours after intravenous injection of radiopharmaceutical.
RADIOPHARMACEUTICALS:  18.1 mCi 2echnetium-77m MDP IV

[Series 1: whole body · 2.66mm/px · 1 of 1 slices shown (1 of 2)]
[im 1/1]
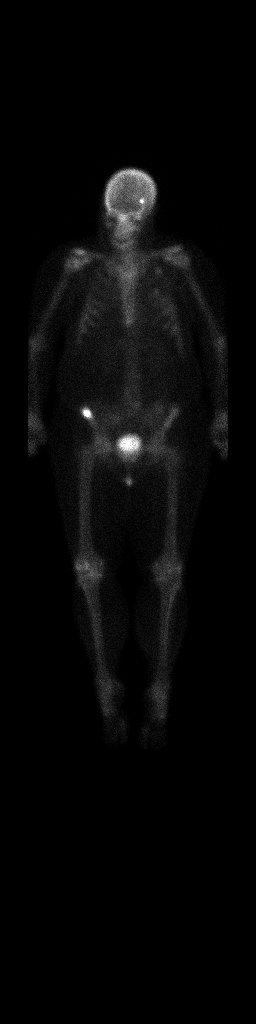

[Series 1: whole body · 2.66mm/px · 1 of 1 slices shown (2 of 2)]
[im 1/1]
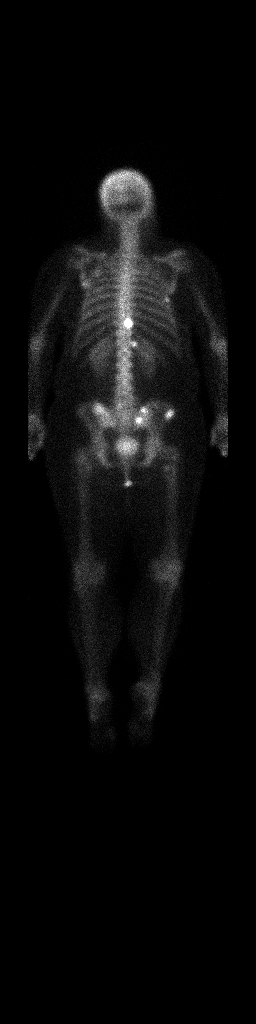

[2 of 2 positions shown; findings below may reference images not displayed]

FINDINGS: Abnormal foci of increased tracer localization at the LEFT frontal
bone, anterior RIGHT iliac crest, 2 sites at the RIGHT SI joint, a
posterior RIGHT mid rib laterally approximately sixth, posterior
RIGHT twelfth rib versus RIGHT transverse process of T12, and at the
T10 vertebral body.

Sites of uptake at the RIGHT SI joint and lateral RIGHT sixth rib
are new since the previous exam.

Questionable abnormal tracer localization at anterolateral LEFT
first rib.

New subtle foci of increased tracer accumulation at the RIGHT lesser
trochanter and at the mid LEFT femoral diaphysis, suspicious for
developing long bone metastases.

Expected urinary tract and soft tissue distribution of tracer.
IMPRESSION: Progressive osseous metastatic disease as above.

Suspicion of new long bone metastases at the RIGHT lesser trochanter
and at the mid LEFT femoral diaphysis.

## 2021-06-08 NOTE — Progress Notes (Signed)
? ?Patient Care Team: ?Dorna Mai, MD as PCP - General (Family Medicine) ? ?DIAGNOSIS:  ?Encounter Diagnosis  ?Name Primary?  ? Malignant neoplasm of lower-inner quadrant of right breast of female, estrogen receptor positive ([place])   ? ? ?SUMMARY OF ONCOLOGIC HISTORY: ?Oncology History  ?Malignant neoplasm of lower-inner quadrant of right breast of female, estrogen receptor positive ([place])  ?02/19/2015 Mammogram  ? Right breast irregular mass lower inner quadrant 2.2 x 1.3 x 1.7 cm with architectural distortion and skin/nipple retraction, T2 N0 stage II a clinical stage, additional benign cysts largest 1.2 cm ? ?  ?02/20/2015 Initial Diagnosis  ? Right breast biopsy 5:00: Invasive ductal carcinoma grade 2, perineural invasion present, ER 90%, PR 70%, HER-2 negative ratio 0.95, Ki-67 15%  ? ?  ?03/07/2015 Breast MRI  ? right breast inferior subareolar mass measuring 2.2 x 2.4 x 2.5 cm with skin and nipple enhancement, extending inferiorly and laterally is intraductal enhancement over a distance of 4 cm, several level I right axillary lymph nodes with cortical thickening ? ?  ?03/13/2015 -  Anti-estrogen oral therapy  ? Neoadjuvant antiestrogen therapy with tamoxifen 20 mg daily (because the patient did not want to undergo surgery immediately for multiple family reasons) stopped in late 2017; anastrozole started 05/11/18 ?  ?05/23/2018 Imaging  ? CT CAP: 2.9 cm spiculated soft tissue density central right breast, no evidence of soft tissue metastatic disease within the chest abdomen pelvis.  Subcentimeter sclerotic bone lesions T10, left pubis, right posterior ilium ?Bone scan: Foci of uptake left frontal calvarium, T-spine, right ilium ? ?  ?10/04/2018 -  Anti-estrogen oral therapy  ? Ibrance with anastrozole ?  ?11/16/2018 Surgery  ? Right mastectomy Ninfa Linden): IDC, grade 1, 5.2cm, grossly involving underlying skin, clear margins. ?  ?12/07/2018 Cancer Staging  ? Staging form: Breast, AJCC 7th Edition ?- Pathologic  stage from 12/07/2018: Stage IV (yT4b, NX, M1) - Signed by Eppie Gibson, MD on 12/07/2018 ? ?  ?12/19/2018 - 01/30/2019 Radiation Therapy  ? Adjuvant radiation ?  ?05/16/2019 Miscellaneous  ? [facility] molecular testing: ER positive, PI K3 CA mutation present, HER-2 negative, ER positive TMB low BRCA1 not detected, ESR 1 not detected, PD-L1 negative ?  ? ? ?CHIEF COMPLIANT: Follow-up of metastatic breast on  Verzenio and Letrozole, to review scans ? ?INTERVAL HISTORY: Emily Wilson is a  56 y.o. with above-mentioned history of metastatic breast cancer currently on anastrozole and Ibrance. She presents to the clinic today for a follow-up. She states that she has develop a cough and her sinus has been bothering her feels like she is clogged. She also states she had involuntary head shaking that she was concerned about. She states that she had diarrhea after the test. She states that she has been choking a lot and things going down wrong. Overall she is tolerating the Verzenio. She also states that she still feels a little tired. ? ? ?ALLERGIES:  is allergic to doxycycline, naproxen, codeine, hydrocodone-acetaminophen, ibuprofen, lantus [insulin glargine], meloxicam, propoxyphene n-acetaminophen, sulfa antibiotics, sulfonamide derivatives, and tramadol. ? ?MEDICATIONS:  ?Current Outpatient Medications  ?Medication Sig Dispense Refill  ? azithromycin (ZITHROMAX Z-PAK) 250 MG tablet Use as directed 6 each 0  ? abemaciclib (VERZENIO) 50 MG tablet Take 1 tablet (50 mg total) by mouth 2 (two) times daily. Swallow tablets whole. Do not chew, crush, or split tablets before swallowing. 56 tablet 2  ? Accu-Chek FastClix Lancets MISC USE TO TEST BLOOD SUGAR UP TO FOUR TIMES DAILY AS DIRECTED  204 each 2  ? ACCU-CHEK GUIDE test strip USE TO CHECK BLOOD SUGAR UP TO 4 TIMES A DAY. DX: E11.65 200 strip 2  ? BD INSULIN SYRINGE U/F 31G X 5/16" 0.5 ML MISC USE TO ADMINISTER INSULIN 3 TIMES DAILY WITH MEALS 100 each 3  ? bismuth  subsalicylate (PEPTO BISMOL) 262 MG/15ML suspension Take 30 mLs by mouth every 6 (six) hours as needed.    ? denosumab (XGEVA) 120 MG/1.7ML SOLN injection Inject 120 mg into the skin every 3 (three) months.    ? diclofenac (VOLTAREN) 75 MG EC tablet Take 1 tablet (75 mg total) by mouth 2 (two) times daily as needed. 60 tablet 2  ? dicyclomine (BENTYL) 20 MG tablet Take 1 tablet (20 mg total) by mouth every 6 (six) hours. 120 tablet 0  ? gabapentin (NEURONTIN) 100 MG capsule Take 3 capsules (300 mg total) by mouth 3 (three) times daily. 90 capsule 1  ? HYDROcodone-acetaminophen (NORCO/VICODIN) 5-325 MG tablet Take 1 tablet by mouth 2 (two) times daily as needed.    ? insulin NPH Human (HUMULIN N) 100 UNIT/ML injection INJECT 12 UNITS INTO THE SKIN AT BEDTIME 10 mL 1  ? insulin regular (NOVOLIN R) 100 units/mL injection Inject 0.12 mLs (12 Units total) into the skin 3 (three) times daily before meals. E.11.9. Hold dose for blood sugar less than 125 10 mL 11  ? letrozole (FEMARA) 2.5 MG tablet TAKE 1 TABLET BY MOUTH EVERY DAY 90 tablet 3  ? loperamide (IMODIUM) 2 MG capsule Take 2 mg by mouth as needed for diarrhea or loose stools. Take 2 tabs (4 mg) with first loose stool, then 1 tab (2 mg) with each additional loose stool. Do not take more than 8 tabs (16 mg) in a 24-hour period.    ? meclizine (ANTIVERT) 25 MG tablet Take 1 tablet (25 mg total) by mouth 3 (three) times daily as needed for dizziness. 30 tablet 0  ? metoprolol succinate (TOPROL-XL) 25 MG 24 hr tablet Take 1 tablet (25 mg total) by mouth daily. 90 tablet 3  ? Milk Thistle 1000 MG CAPS Take 1,000 mg by mouth daily.    ? Multiple Vitamin (MULTIVITAMIN WITH MINERALS) TABS tablet Take 1 tablet by mouth daily.    ? ondansetron (ZOFRAN ODT) 8 MG disintegrating tablet Take 1 tablet (8 mg total) by mouth every 8 (eight) hours as needed for nausea or vomiting. 20 tablet 1  ? PEG-KCl-NaCl-NaSulf-Na Asc-C (PLENVU) 140 g SOLR Take 1 kit by mouth as directed. Use  coupon: BIN: [id number] PNC: CNRX Group: [id number] ID: [id number] 1 each 0  ? rosuvastatin (CRESTOR) 40 MG tablet TAKE 1 TABLET BY MOUTH EVERY DAY 90 tablet 0  ? ?No current facility-administered medications for this visit.  ? ? ?PHYSICAL EXAMINATION: ?ECOG PERFORMANCE STATUS: 1 - Symptomatic but completely ambulatory ? ?Vitals:  ? 06/22/21 0911  ?BP: 118/74  ?Pulse: 83  ?Resp: 18  ?Temp: 97.9 ?F (36.6 ?C)  ?SpO2: 98%  ? ?Filed Weights  ? 06/22/21 0911  ?Weight: 157 lb 6.4 oz (71.4 kg)  ? ?  ? ?LABORATORY DATA:  ?I have reviewed the data as listed ? ?  Latest Ref Rng & Units 05/25/2021  ?  8:58 AM 05/04/2021  ?  9:47 AM 04/30/2021  ? 10:33 AM  ?CMP  ?Glucose 70 - 99 mg/dL 166   116   129    ?BUN 6 - 20 mg/dL '12   13   8    ' ?  Creatinine 0.44 - 1.00 mg/dL 0.78   0.69   0.61    ?Sodium 135 - 145 mmol/L 139   136   145    ?Potassium 3.5 - 5.1 mmol/L 4.7   4.1   4.8    ?Chloride 98 - 111 mmol/L 107   103   105    ?CO2 22 - 32 mmol/L '24   25   21    ' ?Calcium 8.9 - 10.3 mg/dL 9.4   9.1   9.3    ?Total Protein 6.5 - 8.1 g/dL 7.7   7.2   6.7    ?Total Bilirubin 0.3 - 1.2 mg/dL 0.3   0.7   <0.2    ?Alkaline Phos 38 - 126 U/L 88   98   131    ?AST 15 - 41 U/L '15   12   17    ' ?ALT 0 - 44 U/L '13   13   15    ' ? ? ?Lab Results  ?Component Value Date  ? WBC 3.7 (L) 06/22/2021  ? HGB 13.6 06/22/2021  ? HCT 41.0 06/22/2021  ? MCV 96.9 06/22/2021  ? PLT 281 06/22/2021  ? NEUTROABS 2.3 06/22/2021  ? ? ?ASSESSMENT & PLAN:  ?Malignant neoplasm of lower-inner quadrant of right breast of female, estrogen receptor positive ([place]) ?Right breast irregular mass lower inner quadrant retroareolar 02/19/2015: 2.2 x 1.3 x 1.7 cm with architectural distortion and skin/nipple retraction, T2 N0 stage II a clinical stage, additional benign cysts largest 1.2 cm ?Right breast biopsy 02/20/2015 5:00: Invasive ductal carcinoma grade 2, perineural invasion present, ER 90%, PR 70%, HER-2 negative ratio 0.95, Ki-67 15%   ?  ?Breast MRI 03/07/2015: Subareolar mass 2.2  x 2.4 x 2.5 cm extending into the retracted nipple, extending inferiorly and laterally over a distance of 4 cm, several level I right axillary lymph nodes with cortical thickening up to 7 mm with a maximum diamet

## 2021-06-09 ENCOUNTER — Ambulatory Visit (INDEPENDENT_AMBULATORY_CARE_PROVIDER_SITE_OTHER): Payer: Medicare Other | Admitting: Gastroenterology

## 2021-06-09 ENCOUNTER — Other Ambulatory Visit (HOSPITAL_COMMUNITY): Payer: Self-pay

## 2021-06-09 ENCOUNTER — Encounter: Payer: Self-pay | Admitting: Gastroenterology

## 2021-06-09 VITALS — BP 120/60 | HR 82 | Ht <= 58 in | Wt 153.5 lb

## 2021-06-09 DIAGNOSIS — R194 Change in bowel habit: Secondary | ICD-10-CM

## 2021-06-09 DIAGNOSIS — R1319 Other dysphagia: Secondary | ICD-10-CM

## 2021-06-09 MED ORDER — PLENVU 140 G PO SOLR: 1.0000 | ORAL | 0 refills | Status: DC

## 2021-06-09 NOTE — Patient Instructions (Signed)
You have been scheduled for an endoscopy and colonoscopy. Please follow the written instructions given to you at your visit today. ?Please pick up your prep supplies at the pharmacy within the next 1-3 days. ?If you use inhalers (even only as needed), please bring them with you on the day of your procedure. ? ?We have sent the following medications to your pharmacy for you to pick up at your convenience: ?Plenvu  ? ?If you are age 56 or older, your body mass index should be between 23-30. Your Body mass index is 32.08 kg/m?Marland Kitchen If this is out of the aforementioned range listed, please consider follow up with your Primary Care Provider. ? ?If you are age 80 or younger, your body mass index should be between 19-25. Your Body mass index is 32.08 kg/m?Marland Kitchen If this is out of the aformentioned range listed, please consider follow up with your Primary Care Provider.  ? ?________________________________________________________ ? ?The [facility] providers would like to encourage you to use [facility] to communicate with providers for non-urgent requests or questions.  Due to long hold times on the telephone, sending your provider a message by [facility] may be a faster and more efficient way to get a response.  Please allow 48 business hours for a response.  Please remember that this is for non-urgent requests.  ?_______________________________________________________ ? ?Thank you for choosing me and [facility]. ? ?Dr.Henry Danis  ? ?

## 2021-06-09 NOTE — Progress Notes (Signed)
? ? ?[facility] Consult Note: ? ?History: ?Emily Wilson ?06/09/2021 ? ?Referring provider: Dorna Mai, MD ? ?Reason for consult/chief complaint: Constipation (Alternating with diarrhea  ), Diarrhea, Gastroesophageal Reflux, Dysphagia (Food getting stuck in throat), and adominal spasm ? ? ?Subjective  ?HPI: ? ?This is a pleasant 56 year old woman referred by primary care for digestive symptoms. ?Recent primary care office note reviewed.  Patient was describing dysphagia and "gastric spasms".  No additional lab or imaging ordered, dicyclomine prescribed. ? ?Emily Wilson was diagnosed with breast cancer in 2020 and underwent right mastectomy and radiation therapy.  She has bone metastasis and recently started immunotherapy with a new medicine.  Since the radiation she has had diarrhea and intermittent constipation without rectal bleeding.  She also developed esophageal dysphagia that has been intermittent with food or liquid feel hung up or stuck in the lower chest.  There is intermittent lower abdominal spasm without food or other clear triggers.  The dicyclomine has not been of much help and I recommended she stop it. ?Negative Cologuard December 2020 ? ?ROS: ? ?Review of Systems  ?Constitutional:  Positive for fatigue. Negative for appetite change and unexpected weight change.  ?HENT:  Negative for mouth sores and voice change.   ?Eyes:  Negative for pain and redness.  ?Respiratory:  Positive for cough. Negative for shortness of breath.   ?Cardiovascular:  Positive for palpitations. Negative for chest pain.  ?Genitourinary:  Negative for dysuria and hematuria.  ?Musculoskeletal:  Negative for arthralgias and myalgias.  ?Skin:  Negative for pallor and rash.  ?Neurological:  Positive for headaches. Negative for weakness.  ?Hematological:  Negative for adenopathy.  ?Psychiatric/Behavioral:    ?     Anxiety  ?Chronic bony pain ? ?Past Medical History: ?Past Medical History:  ?Diagnosis Date  ?  Anxiety state, unspecified   ? Arthritis   ? Breast cancer of lower-inner quadrant of right female breast ([place])   ? Carpal tunnel syndrome   ? Complication of anesthesia   ? woke up during ganglion cyst removal in her 20's, not woken up since  ? 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 Diverticulosis   ? Heart murmur   ? History of radiation therapy 12/18/18- 01/30/19  ? Right Chest wall 25 fractions X 2Gy each to total 50 Gy, followed by a boost 10 Gy in 5 fractions.   ? Hyperlipidemia   ? Hyperthyroidism   ? Mitral valve prolapse   ? Pneumonia   ? Smoker   ? Tachycardia   ? 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 ? ? ?Past Surgical History: ?Past Surgical History:  ?Procedure Laterality Date  ? ABDOMINAL HYSTERECTOMY    ? ANTERIOR CRUCIATE LIGAMENT REPAIR Right 2007  ? ACL repair  ? APPENDECTOMY    ? CARPAL TUNNEL RELEASE Right   ? GANGLION CYST EXCISION Right   ? TOTAL MASTECTOMY Right 11/16/2018  ? Procedure: RIGHT MASTECTOMY;  Surgeon: Coralie Keens, MD;  Location: [facility];  Service: General;  Laterality: Right;  ? TUBAL LIGATION    ? ? ? ?Family History: ?Family History  ?Problem Relation Age of Onset  ? Diabetes Mother   ? Bladder Cancer Mother 83  ?     smoker  ? Other Mother 30  ?     TAH for unspecified reason  ? Multiple sclerosis Mother   ? Kidney failure Mother   ? Cancer Father  53  ?     lymphatic/tonsil cancer - in remission; former smoker  ? Diabetes Sister   ? Diabetes Sister   ? Heart attack Brother   ? COPD Maternal Grandmother   ?     smoker  ? Emphysema Maternal Grandmother   ?     smoker  ? Diabetes Maternal Grandmother   ? Stroke Maternal Grandfather   ? Diabetes Paternal Grandmother   ? Dementia Maternal Uncle   ? COPD Maternal Uncle   ?     smoker  ? Multiple sclerosis Paternal Aunt   ? Breast cancer Paternal Aunt   ?     dx. late 66s - early 72s  ? ? ?Social History: ?Social History  ? ?Socioeconomic  History  ? Marital status: Divorced  ?  Spouse name: Not on file  ? Number of children: 2  ? Years of education: Not on file  ? Highest education level: Not on file  ?Occupational History  ? Occupation: Disabled  ?Tobacco Use  ? Smoking status: Former  ?  Packs/day: 3.00  ?  Years: 30.00  ?  Pack years: 90.00  ?  Types: Cigarettes  ?  Quit date: 10/24/2011  ?  Years since quitting: 9.6  ? Smokeless tobacco: Never  ?Vaping Use  ? Vaping Use: Never used  ?Substance and Sexual Activity  ? Alcohol use: No  ? Drug use: Never  ? Sexual activity: Not Currently  ?Other Topics Concern  ? Not on file  ?Social History Narrative  ? In LTR w/boyfriend  ? Works Chiropractor, Scientist, water quality  ? ?Social Determinants of Health  ? ?Financial Resource Strain: Medium Risk  ? Difficulty of Paying Living Expenses: Somewhat hard  ?Food Insecurity: No Food Insecurity  ? Worried About Charity fundraiser in the Last Year: Never true  ? Ran Out of Food in the Last Year: Never true  ?Transportation Needs: No Transportation Needs  ? Lack of Transportation (Medical): No  ? Lack of Transportation (Non-Medical): No  ?Physical Activity: Inactive  ? Days of Exercise per Week: 0 days  ? Minutes of Exercise per Session: 0 min  ?Stress: Stress Concern Present  ? Feeling of Stress : Very much  ?Social Connections: Socially Isolated  ? Frequency of Communication with Friends and Family: More than three times a week  ? Frequency of Social Gatherings with Friends and Family: More than three times a week  ? Attends Religious Services: Never  ? Active Member of Clubs or Organizations: No  ? Attends Archivist Meetings: Never  ? Marital Status: Divorced  ? ? ?Allergies: ?Allergies  ?Allergen Reactions  ? Doxycycline Shortness Of Breath  ?  Wheezing, shortness of breath, rash head to toe, and swelling  ? Naproxen Shortness Of Breath  ? Codeine Hives  ? Hydrocodone-Acetaminophen Hives  ? Ibuprofen   ?  Irritant to stomach r/t diverticulitis  ? Lantus [Insulin  Glargine]   ?  Yeast Infections  ? Meloxicam Other (See Comments)  ?  Abdominal pain ?  ? Propoxyphene N-Acetaminophen Hives  ? Sulfonamide Derivatives Hives  ? Tramadol Other (See Comments)  ?  Abdominal Pain  ? ? ?Outpatient Meds: ?Current Outpatient Medications  ?Medication Sig Dispense Refill  ? abemaciclib (VERZENIO) 50 MG tablet Take 1 tablet (50 mg total) by mouth 2 (two) times daily. Swallow tablets whole. Do not chew, crush, or split tablets before swallowing. 56 tablet 2  ? Accu-Chek FastClix Lancets MISC USE TO TEST  BLOOD SUGAR UP TO FOUR TIMES DAILY AS DIRECTED 204 each 2  ? ACCU-CHEK GUIDE test strip USE TO CHECK BLOOD SUGAR UP TO 4 TIMES A DAY. DX: E11.65 200 strip 2  ? BD INSULIN SYRINGE U/F 31G X 5/16" 0.5 ML MISC USE TO ADMINISTER INSULIN 3 TIMES DAILY WITH MEALS 100 each 3  ? bismuth subsalicylate (PEPTO BISMOL) 262 MG/15ML suspension Take 30 mLs by mouth every 6 (six) hours as needed.    ? denosumab (XGEVA) 120 MG/1.7ML SOLN injection Inject 120 mg into the skin every 3 (three) months.    ? diclofenac (VOLTAREN) 75 MG EC tablet Take 1 tablet (75 mg total) by mouth 2 (two) times daily as needed. 60 tablet 2  ? dicyclomine (BENTYL) 20 MG tablet Take 1 tablet (20 mg total) by mouth every 6 (six) hours. 120 tablet 0  ? gabapentin (NEURONTIN) 100 MG capsule Take 3 capsules (300 mg total) by mouth 3 (three) times daily. 90 capsule 1  ? HYDROcodone-acetaminophen (NORCO/VICODIN) 5-325 MG tablet Take 1 tablet by mouth 2 (two) times daily as needed.    ? insulin NPH Human (HUMULIN N) 100 UNIT/ML injection INJECT 12 UNITS INTO THE SKIN AT BEDTIME 10 mL 1  ? insulin regular (NOVOLIN R) 100 units/mL injection Inject 0.12 mLs (12 Units total) into the skin 3 (three) times daily before meals. E.11.9. Hold dose for blood sugar less than 125 10 mL 11  ? letrozole (FEMARA) 2.5 MG tablet TAKE 1 TABLET BY MOUTH EVERY DAY 90 tablet 3  ? loperamide (IMODIUM) 2 MG capsule Take 2 mg by mouth as needed for diarrhea or  loose stools. Take 2 tabs (4 mg) with first loose stool, then 1 tab (2 mg) with each additional loose stool. Do not take more than 8 tabs (16 mg) in a 24-hour period.    ? meclizine (ANTIVERT) 25 MG tablet Take 1 ta

## 2021-06-15 ENCOUNTER — Other Ambulatory Visit (HOSPITAL_COMMUNITY): Payer: Self-pay

## 2021-06-16 ENCOUNTER — Other Ambulatory Visit (HOSPITAL_COMMUNITY): Payer: Self-pay

## 2021-06-16 ENCOUNTER — Ambulatory Visit (HOSPITAL_COMMUNITY)
Admission: RE | Admit: 2021-06-16 | Discharge: 2021-06-16 | Disposition: A | Discharge status: Home / Self Care | Payer: Medicare Other | Source: Ambulatory Visit | Attending: Hematology and Oncology | Admitting: Hematology and Oncology

## 2021-06-16 ENCOUNTER — Encounter (HOSPITAL_COMMUNITY): Payer: Self-pay

## 2021-06-16 DIAGNOSIS — C50311 Malignant neoplasm of lower-inner quadrant of right female breast: Secondary | ICD-10-CM | POA: Diagnosis present

## 2021-06-16 DIAGNOSIS — Z170 Estrogen receptor positive status [ER+]: Secondary | ICD-10-CM | POA: Diagnosis present

## 2021-06-16 MED ORDER — SODIUM CHLORIDE (PF) 0.9 % IJ SOLN
INTRAMUSCULAR | Status: AC
  Filled 2021-06-16: qty 50

## 2021-06-16 MED ORDER — IOHEXOL 300 MG/ML  SOLN
100.0000 mL | Freq: Once | INTRAMUSCULAR | Status: AC | PRN
  Administered 2021-06-16: 100 mL via INTRAVENOUS

## 2021-06-19 ENCOUNTER — Other Ambulatory Visit: Payer: Self-pay | Admitting: Internal Medicine

## 2021-06-19 DIAGNOSIS — E785 Hyperlipidemia, unspecified: Secondary | ICD-10-CM

## 2021-06-19 NOTE — Telephone Encounter (Signed)
Requested Prescriptions  ?Pending Prescriptions Disp Refills  ?? rosuvastatin (CRESTOR) 40 MG tablet [Pharmacy Med Name: ROSUVASTATIN CALCIUM 40 MG TAB] 90 tablet 0  ?  Sig: TAKE 1 TABLET BY MOUTH EVERY DAY  ?  ? Cardiovascular:  Antilipid - Statins 2 Failed - 06/19/2021  2:38 AM  ?  ?  Failed - Lipid Panel in normal range within the last 12 months  ?  Cholesterol, Total  ?Date Value Ref Range Status  ?11/11/2020 307 (H) 100 - 199 mg/dL Final  ? ?LDL Chol Calc (NIH)  ?Date Value Ref Range Status  ?11/11/2020 200 (H) 0 - 99 mg/dL Final  ? ?Direct LDL  ?Date Value Ref Range Status  ?11/04/2015 210.0 mg/dL Final  ?  Comment:  ?  Optimal:  <100 mg/dLNear or Above Optimal:  100-129 mg/dLBorderline High:  130-159 mg/dLHigh:  160-189 mg/dLVery High:  >190 mg/dL  ? ?HDL  ?Date Value Ref Range Status  ?11/11/2020 49 >39 mg/dL Final  ? ?Triglycerides  ?Date Value Ref Range Status  ?11/11/2020 291 (H) 0 - 149 mg/dL Final  ? ?  ?  ?  Passed - Cr in normal range and within 360 days  ?  Creatinine  ?Date Value Ref Range Status  ?05/25/2021 0.78 0.44 - 1.00 mg/dL Final  ?   ?  ?  Passed - Patient is not pregnant  ?  ?  Passed - Valid encounter within last 12 months  ?  Recent Outpatient Visits   ?      ? 1 month ago Dysphagia, unspecified type  ? Primary Care at [facility], MD  ? 4 months ago Essential hypertension  ? Primary Care at [facility], MD  ? 7 months ago Type 2 diabetes mellitus with other circulatory complication, with long-term current use of insulin ([place])  ? Primary Care at [facility], MD  ? 11 months ago Hyperlipidemia, unspecified hyperlipidemia type  ? Primary Care at [facility], [facility] Beaver, MD  ? 1 year ago Primary hypertension  ? Primary Care at [facility], Bayard Beaver, MD  ?  ?  ?Future Appointments   ?        ? In 1 month Dorna Mai, MD Primary Care at [facility]  ?  ? ?  ?  ?  ? ?

## 2021-06-22 ENCOUNTER — Other Ambulatory Visit: Payer: Self-pay

## 2021-06-22 ENCOUNTER — Inpatient Hospital Stay (HOSPITAL_BASED_OUTPATIENT_CLINIC_OR_DEPARTMENT_OTHER): Payer: Medicare Other | Admitting: Hematology and Oncology

## 2021-06-22 ENCOUNTER — Inpatient Hospital Stay: Payer: Medicare Other | Attending: Medical

## 2021-06-22 DIAGNOSIS — C7951 Secondary malignant neoplasm of bone: Secondary | ICD-10-CM | POA: Insufficient documentation

## 2021-06-22 DIAGNOSIS — C50311 Malignant neoplasm of lower-inner quadrant of right female breast: Secondary | ICD-10-CM | POA: Diagnosis present

## 2021-06-22 DIAGNOSIS — Z79811 Long term (current) use of aromatase inhibitors: Secondary | ICD-10-CM

## 2021-06-22 DIAGNOSIS — J069 Acute upper respiratory infection, unspecified: Secondary | ICD-10-CM | POA: Insufficient documentation

## 2021-06-22 DIAGNOSIS — Z9011 Acquired absence of right breast and nipple: Secondary | ICD-10-CM | POA: Insufficient documentation

## 2021-06-22 DIAGNOSIS — Z170 Estrogen receptor positive status [ER+]: Secondary | ICD-10-CM | POA: Insufficient documentation

## 2021-06-22 DIAGNOSIS — Z923 Personal history of irradiation: Secondary | ICD-10-CM | POA: Insufficient documentation

## 2021-06-22 DIAGNOSIS — Z79899 Other long term (current) drug therapy: Secondary | ICD-10-CM | POA: Diagnosis not present

## 2021-06-22 LAB — CBC WITH DIFFERENTIAL (CANCER CENTER ONLY)
Abs Immature Granulocytes: 0.01 10*3/uL (ref 0.00–0.07)
Basophils Absolute: 0.1 10*3/uL (ref 0.0–0.1)
Basophils Relative: 1 %
Eosinophils Absolute: 0.3 10*3/uL (ref 0.0–0.5)
Eosinophils Relative: 7 %
HCT: 41 % (ref 36.0–46.0)
Hemoglobin: 13.6 g/dL (ref 12.0–15.0)
Immature Granulocytes: 0 %
Lymphocytes Relative: 17 %
Lymphs Abs: 0.6 10*3/uL — ABNORMAL LOW (ref 0.7–4.0)
MCH: 32.2 pg (ref 26.0–34.0)
MCHC: 33.2 g/dL (ref 30.0–36.0)
MCV: 96.9 fL (ref 80.0–100.0)
Monocytes Absolute: 0.4 10*3/uL (ref 0.1–1.0)
Monocytes Relative: 12 %
Neutro Abs: 2.3 10*3/uL (ref 1.7–7.7)
Neutrophils Relative %: 63 %
Platelet Count: 281 10*3/uL (ref 150–400)
RBC: 4.23 MIL/uL (ref 3.87–5.11)
RDW: 12.8 % (ref 11.5–15.5)
WBC Count: 3.7 10*3/uL — ABNORMAL LOW (ref 4.0–10.5)
nRBC: 0 % (ref 0.0–0.2)

## 2021-06-22 LAB — CMP (CANCER CENTER ONLY)
ALT: 14 U/L (ref 0–44)
AST: 14 U/L — ABNORMAL LOW (ref 15–41)
Albumin: 3.8 g/dL (ref 3.5–5.0)
Alkaline Phosphatase: 90 U/L (ref 38–126)
Anion gap: 5 (ref 5–15)
BUN: 17 mg/dL (ref 6–20)
CO2: 26 mmol/L (ref 22–32)
Calcium: 8.9 mg/dL (ref 8.9–10.3)
Chloride: 106 mmol/L (ref 98–111)
Creatinine: 0.85 mg/dL (ref 0.44–1.00)
GFR, Estimated: >60 mL/min (ref >60)
Glucose, Bld: 235 mg/dL — ABNORMAL HIGH (ref 70–99)
Potassium: 4.2 mmol/L (ref 3.5–5.1)
Sodium: 137 mmol/L (ref 135–145)
Total Bilirubin: 0.4 mg/dL (ref 0.3–1.2)
Total Protein: 6.9 g/dL (ref 6.5–8.1)

## 2021-06-22 MED ORDER — AZITHROMYCIN 250 MG PO TABS: ORAL_TABLET | ORAL | 0 refills | Status: DC

## 2021-06-22 NOTE — Assessment & Plan Note (Signed)
Right breast irregular mass lower inner quadrant retroareolar 02/19/2015: 2.2 x 1.3 x 1.7 cm with architectural distortion and skin/nipple retraction, T2 N0 stage II a clinical stage, additional benign cysts largest 1.2 cm ?Right breast biopsy 02/20/2015 5:00: Invasive ductal carcinoma grade 2, perineural invasion present, ER 90%, PR 70%, HER-2 negative ratio 0.95, Ki-67 15% ? ?? ?Breast MRI 03/07/2015: Subareolar mass 2.2 x 2.4 x 2.5 cm extending into the retracted nipple, extending inferiorly and laterally over a distance of 4 cm, several level I right axillary lymph nodes with cortical thickening up to 7 mm with a maximum diameter of 15 mm ?? ?11/16/2018:Right mastectomy Ninfa Linden)?performed because the tumor was fungating: IDC, grade 1, 5.2cm, grossly involving underlying skin, clear margins. ?Radiation to chest wall: 12/19/18- 01/30/19 ?Bone scan 06/26/2019: Stable distribution of bone metastases. ?CT CAP?04/28/2020:?Mixed lytic and sclerotic bone metastases several demonstrating increased lytic character concern for worsening bone mets. ?05/16/2019:[facility] molecular testing: ER positive, PIK3CA mutation present, HER-2 negative, ER positive TMB low BRCA1 not detected, ESR 1 not detected, PD-L1 negative ?--------------------------------------------------------------------- ?Treatment summary:?Anastrozole with Delton See started April 2020?Ibrance added 09/29/2018, Faslodex 11/20/2020 discontinued January 2023 (due to progression and toxicity) ? ?Current treatment: Verzinio with letrozole along with Xgeva started 04/11/2021 ? ?Verzinio toxicities: ? ?Plan to obtain scans and follow-up in 1 month ?

## 2021-06-30 ENCOUNTER — Other Ambulatory Visit (HOSPITAL_COMMUNITY): Payer: Self-pay

## 2021-07-13 ENCOUNTER — Other Ambulatory Visit (HOSPITAL_COMMUNITY): Payer: Self-pay

## 2021-07-14 ENCOUNTER — Inpatient Hospital Stay: Payer: Medicare Other | Attending: Hematology and Oncology

## 2021-07-14 ENCOUNTER — Other Ambulatory Visit (HOSPITAL_COMMUNITY): Payer: Self-pay

## 2021-07-14 ENCOUNTER — Inpatient Hospital Stay: Payer: Medicare Other

## 2021-07-14 ENCOUNTER — Inpatient Hospital Stay: Payer: Medicare Other | Admitting: Pharmacist

## 2021-07-14 ENCOUNTER — Other Ambulatory Visit: Payer: Self-pay

## 2021-07-14 VITALS — BP 112/84 | HR 91 | Temp 97.0°F | Resp 18 | Ht <= 58 in | Wt 159.5 lb

## 2021-07-14 VITALS — BP 118/63 | HR 80 | Temp 98.2°F | Resp 18

## 2021-07-14 DIAGNOSIS — Z79899 Other long term (current) drug therapy: Secondary | ICD-10-CM | POA: Insufficient documentation

## 2021-07-14 DIAGNOSIS — Z79811 Long term (current) use of aromatase inhibitors: Secondary | ICD-10-CM | POA: Insufficient documentation

## 2021-07-14 DIAGNOSIS — Z87891 Personal history of nicotine dependence: Secondary | ICD-10-CM | POA: Insufficient documentation

## 2021-07-14 DIAGNOSIS — Z170 Estrogen receptor positive status [ER+]: Secondary | ICD-10-CM

## 2021-07-14 DIAGNOSIS — Z923 Personal history of irradiation: Secondary | ICD-10-CM | POA: Diagnosis not present

## 2021-07-14 DIAGNOSIS — C7951 Secondary malignant neoplasm of bone: Secondary | ICD-10-CM | POA: Insufficient documentation

## 2021-07-14 DIAGNOSIS — C50311 Malignant neoplasm of lower-inner quadrant of right female breast: Secondary | ICD-10-CM | POA: Diagnosis present

## 2021-07-14 DIAGNOSIS — Z9011 Acquired absence of right breast and nipple: Secondary | ICD-10-CM | POA: Insufficient documentation

## 2021-07-14 LAB — CBC WITH DIFFERENTIAL (CANCER CENTER ONLY)
Abs Immature Granulocytes: 0.04 10*3/uL (ref 0.00–0.07)
Basophils Absolute: 0.1 10*3/uL (ref 0.0–0.1)
Basophils Relative: 2 %
Eosinophils Absolute: 0.2 10*3/uL (ref 0.0–0.5)
Eosinophils Relative: 5 %
HCT: 41 % (ref 36.0–46.0)
Hemoglobin: 13.9 g/dL (ref 12.0–15.0)
Immature Granulocytes: 1 %
Lymphocytes Relative: 19 %
Lymphs Abs: 0.8 10*3/uL (ref 0.7–4.0)
MCH: 32.3 pg (ref 26.0–34.0)
MCHC: 33.9 g/dL (ref 30.0–36.0)
MCV: 95.3 fL (ref 80.0–100.0)
Monocytes Absolute: 0.8 10*3/uL (ref 0.1–1.0)
Monocytes Relative: 18 %
Neutro Abs: 2.4 10*3/uL (ref 1.7–7.7)
Neutrophils Relative %: 55 %
Platelet Count: 342 10*3/uL (ref 150–400)
RBC: 4.3 MIL/uL (ref 3.87–5.11)
RDW: 13.2 % (ref 11.5–15.5)
WBC Count: 4.3 10*3/uL (ref 4.0–10.5)
nRBC: 0 % (ref 0.0–0.2)

## 2021-07-14 LAB — CMP (CANCER CENTER ONLY)
ALT: 17 U/L (ref 0–44)
AST: 17 U/L (ref 15–41)
Albumin: 4 g/dL (ref 3.5–5.0)
Alkaline Phosphatase: 107 U/L (ref 38–126)
Anion gap: 5 (ref 5–15)
BUN: 18 mg/dL (ref 6–20)
CO2: 27 mmol/L (ref 22–32)
Calcium: 9.4 mg/dL (ref 8.9–10.3)
Chloride: 107 mmol/L (ref 98–111)
Creatinine: 0.76 mg/dL (ref 0.44–1.00)
GFR, Estimated: >60 mL/min (ref >60)
Glucose, Bld: 86 mg/dL (ref 70–99)
Potassium: 4.2 mmol/L (ref 3.5–5.1)
Sodium: 139 mmol/L (ref 135–145)
Total Bilirubin: 0.4 mg/dL (ref 0.3–1.2)
Total Protein: 7.3 g/dL (ref 6.5–8.1)

## 2021-07-14 MED ORDER — DENOSUMAB 120 MG/1.7ML ~~LOC~~ SOLN
120.0000 mg | Freq: Once | SUBCUTANEOUS | Status: AC
  Administered 2021-07-14: 120 mg via SUBCUTANEOUS
  Filled 2021-07-14: qty 1.7

## 2021-07-14 MED ORDER — ABEMACICLIB 50 MG PO TABS
50.0000 mg | ORAL_TABLET | Freq: Two times a day (BID) | ORAL | 3 refills | Status: DC
Start: 2021-07-14 — End: 2021-11-04
  Filled 2021-07-14 – 2021-08-05 (×2): qty 56, 28d supply, fill #0
  Filled 2021-09-02: qty 56, 28d supply, fill #1
  Filled 2021-09-29: qty 56, 28d supply, fill #2
  Filled 2021-10-28: qty 56, 28d supply, fill #3

## 2021-07-14 NOTE — Progress Notes (Signed)
[place]       Telephone: [phone]?Fax: [phone]   Oncology Clinical Pharmacist Practitioner Progress Note  Emily Wilson was contacted via in-person to discuss her chemotherapy regimen for abemaciclib which they receive under the care of Dr. [name].   Current treatment regimen and start date Abemaciclib (04/20/21) Letrozole (08/31/18) Denosumab 120 mg (09/07/18)   Interval History She continues on abemaciclib 50 mg by mouth every 12 hours on days 1 to 28 of a 28-day cycle. This is being given  in combination with letrozole and denosumab. Denosumab due next in June . Therapy is planned to continue until disease progression or unacceptable toxicity. Marland Kitchen   Response to Therapy Emily Wilson was seen today by clinical pharmacy as a follow-up to her abemaciclib management.  She was last seen by Dr. Lindi Adie on 06/22/21 and clinical pharmacy on 05/25/21.  She had restaging scans on 06/16/21 which showed stable disease.  Dr. Lindi Adie has recommended scans every 6 months, and to be seen every 3 months.  He next sees Emily Wilson on 10/06/21 which will coincide with her every 8-monthXgeva.  She is receiving Xgeva today.  Emily Wilson that she still has a cough but she feels it is better.  Dr. GLindi Adiehad prescribed azithromycin at her last visit.  She also reports occasional diarrhea.  She avoids meat for the most part.  She does like to eat a lot of fruits and vegetables including avocados.  She feels the loose stool is about 3 or 4 times a week but it is controlled with loperamide and she shows no signs of dehydration with her labs today.  She also continues to have occasional nausea.  She uses ginger in her smoothies which she feels helps.  She takes ondansetron if the nausea is severe but that is on a rare occasion.  She also takes peppermint as needed as well.  She does also report some joint pain mainly in her right knee where she had prior surgery.  This is more likely due to  to the letrozole but could have an osteoarthritis component.  She continues to follow with Dr. ADorna Maifor her diabetes management.  She next sees her in June.  Today we updated her medication list.  The plan will be for her to see Dr. GLindi Adiewith labs in August when she is due for her next Xgeva injection.  We will then schedule a follow-up appointment with Dr. GLindi Adieand labs with her next Xgeva injection for November.  At that time she will likely have scans so that those can be reviewed by Dr. GLindi Adie  We will schedule our next visit sometime in February and she knows that she can contact clinical pharmacy sooner if needed.  Labs, vitals, treatment parameters, and manufacturer guidelines assessing toxicity were reviewed with Emily Wilson. Based on these values, patient is in agreement to continue abemaciclib therapy at this time.  Allergies Allergies  Allergen Reactions   Doxycycline Shortness Of Breath    Wheezing, shortness of breath, rash head to toe, and swelling   Naproxen Shortness Of Breath   Codeine Hives   Hydrocodone-Acetaminophen Hives   Ibuprofen     Irritant to stomach r/t diverticulitis   Lantus [Insulin Glargine]     Yeast Infections   Meloxicam Other (See Comments)    Abdominal pain    Propoxyphene N-Acetaminophen Hives   Sulfa Antibiotics Hives   Sulfonamide Derivatives Hives   Tramadol  Other (See Comments)    Abdominal Pain    Vitals    07/14/2021    9:54 AM 06/22/2021    9:11 AM 06/09/2021    9:28 AM  Vitals with BMI  Height '4\' 10"'  '4\' 10"'  '4\' 10"'   Weight 159 lbs 8 oz 157 lbs 6 oz 153 lbs 8 oz  BMI 33.34 27.25 36.64  Systolic 403 474 259  Diastolic 84 74 60  Pulse 91 83 82     Laboratory Data    Latest Ref Rng & Units 07/14/2021    9:32 AM 06/22/2021    8:58 AM 05/25/2021    8:58 AM  CBC EXTENDED  WBC 4.0 - 10.5 K/uL 4.3   3.7   4.0    RBC 3.87 - 5.11 MIL/uL 4.30   4.23   4.39    Hemoglobin 12.0 - 15.0 g/dL 13.9   13.6   14.0    HCT 36.0  - 46.0 % 41.0   41.0   42.1    Platelets 150 - 400 K/uL 342   281   331    NEUT# 1.7 - 7.7 K/uL 2.4   2.3   2.2    Lymph# 0.7 - 4.0 K/uL 0.8   0.6   0.7         Latest Ref Rng & Units 07/14/2021    9:32 AM 06/22/2021    8:58 AM 05/25/2021    8:58 AM  CMP  Glucose 70 - 99 mg/dL 86   235   166    BUN 6 - 20 mg/dL '18   17   12    ' Creatinine 0.44 - 1.00 mg/dL 0.76   0.85   0.78    Sodium 135 - 145 mmol/L 139   137   139    Potassium 3.5 - 5.1 mmol/L 4.2   4.2   4.7    Chloride 98 - 111 mmol/L 107   106   107    CO2 22 - 32 mmol/L '27   26   24    ' Calcium 8.9 - 10.3 mg/dL 9.4   8.9   9.4    Total Protein 6.5 - 8.1 g/dL 7.3   6.9   7.7    Total Bilirubin 0.3 - 1.2 mg/dL 0.4   0.4   0.3    Alkaline Phos 38 - 126 U/L 107   90   88    AST 15 - 41 U/L '17   14   15    ' ALT 0 - 44 U/L '17   14   13      ' No results found for: MG  Adverse Effects Assessment Diarrhea: As above, Emily Wilson eats a lot of fruits and vegetables.  The loperamide is working as needed.   Nausea: As above, Emily Wilson is using ginger in her smoothies.  Also uses ondansetron when nausea is severe.  No vomiting to report.  Adherence Assessment Emily Wilson reports missing 0 doses over the past 4 weeks.   Reason for missed dose: N/A Patient was re-educated on importance of adherence.   Access Assessment Emily Wilson is currently receiving her abemaciclib through [employer] concerns: None  Medication Reconciliation The patient's medication list was reviewed today with the patient?  Yes New medications or herbal supplements have recently been started?  No Any medications have been discontinued?  Yes, medication list updated. The medication list was updated and reconciled based on  the patient's most recent medication list in the electronic medical record (EMR) including herbal products and OTC medications.   Medications Current Outpatient Medications  Medication Sig Dispense Refill    abemaciclib (VERZENIO) 50 MG tablet Take 1 tablet (50 mg total) by mouth 2 (two) times daily. Swallow tablets whole. Do not chew, crush, or split tablets before swallowing. 56 tablet 2   Accu-Chek FastClix Lancets MISC USE TO TEST BLOOD SUGAR UP TO FOUR TIMES DAILY AS DIRECTED 204 each 2   ACCU-CHEK GUIDE test strip USE TO CHECK BLOOD SUGAR UP TO 4 TIMES A DAY. DX: E11.65 200 strip 2   BD INSULIN SYRINGE U/F 31G X 5/16" 0.5 ML MISC USE TO ADMINISTER INSULIN 3 TIMES DAILY WITH MEALS 100 each 3   denosumab (XGEVA) 120 MG/1.7ML SOLN injection Inject 120 mg into the skin every 3 (three) months.     diclofenac (VOLTAREN) 75 MG EC tablet Take 1 tablet (75 mg total) by mouth 2 (two) times daily as needed. 60 tablet 2   gabapentin (NEURONTIN) 100 MG capsule Take 3 capsules (300 mg total) by mouth 3 (three) times daily. 90 capsule 1   insulin NPH Human (HUMULIN N) 100 UNIT/ML injection INJECT 12 UNITS INTO THE SKIN AT BEDTIME 10 mL 1   insulin regular (NOVOLIN R) 100 units/mL injection Inject 0.12 mLs (12 Units total) into the skin 3 (three) times daily before meals. E.11.9. Hold dose for blood sugar less than 125 10 mL 11   letrozole (FEMARA) 2.5 MG tablet TAKE 1 TABLET BY MOUTH EVERY DAY 90 tablet 3   loperamide (IMODIUM) 2 MG capsule Take 2 mg by mouth as needed for diarrhea or loose stools. Take 2 tabs (4 mg) with first loose stool, then 1 tab (2 mg) with each additional loose stool. Do not take more than 8 tabs (16 mg) in a 24-hour period.     ondansetron (ZOFRAN ODT) 8 MG disintegrating tablet Take 1 tablet (8 mg total) by mouth every 8 (eight) hours as needed for nausea or vomiting. 20 tablet 1   rosuvastatin (CRESTOR) 40 MG tablet TAKE 1 TABLET BY MOUTH EVERY DAY 90 tablet 0   bismuth subsalicylate (PEPTO BISMOL) 262 MG/15ML suspension Take 30 mLs by mouth every 6 (six) hours as needed. (Patient not taking: Reported on 07/14/2021)     dicyclomine (BENTYL) 20 MG tablet Take 1 tablet (20 mg total) by  mouth every 6 (six) hours. (Patient not taking: Reported on 07/14/2021) 120 tablet 0   meclizine (ANTIVERT) 25 MG tablet Take 1 tablet (25 mg total) by mouth 3 (three) times daily as needed for dizziness. (Patient not taking: Reported on 07/14/2021) 30 tablet 0   metoprolol succinate (TOPROL-XL) 25 MG 24 hr tablet Take 1 tablet (25 mg total) by mouth daily. 90 tablet 3   Milk Thistle 1000 MG CAPS Take 1,000 mg by mouth daily as needed. (Patient not taking: Reported on 07/14/2021)     PEG-KCl-NaCl-NaSulf-Na Asc-C (PLENVU) 140 g SOLR Take 1 kit by mouth as directed. Use coupon: BIN: [id number] [facility]NC: CNRX Group: [id number] ID: [id number] (Patient not taking: Reported on 07/14/2021) 1 each 0   No current facility-administered medications for this visit.    Drug-Drug Interactions (DDIs) DDIs were evaluated?  Yes Significant DDIs?  No The patient was instructed to speak with their health care provider and/or the oral chemotherapy pharmacist before starting any new drug, including prescription or over the counter, natural / herbal products, or vitamins.  Supportive Care  Diarrhea: we reviewed that diarrhea is common with abemaciclib and confirmed that she does have loperamide (Imodium) at home.  We reviewed how to take this medication PRN Neutropenia: we discussed the importance of having a thermometer and what the Centers for Disease Control and Prevention (CDC) considers a fever which is 100.19F (38C) or higher.  Gave patient 24/7 triage line to call if any fevers or symptoms ILD/Pneumonitis: we reviewed potential symptoms including cough, shortness, and fatigue.  Hepatotoxicity: Within normal limits VTE: reviewed signs of DVT such as leg swelling, redness, pain, or tenderness and signs of PE such as shortness of breath, rapid or irregular heartbeat, cough, chest pain, or lightheadedness Reviewed to take the medication every 12 hours (with food sometimes can be easier on the stomach) and to take it at the  same time every day.  Diabetes management by Dr. Dorna Mai  Dosing Assessment Hepatic adjustments needed?  No Renal adjustments needed?  No Toxicity adjustments needed?  No The current dosing regimen is appropriate to continue at this time.  Follow-Up Plan Continue abemaciclib 50 mg every 12 hours Denosumab 120 mg to be administered today and every 12 weeks Continue to follow with Dr. Dorna Mai (PCP).  Next appointment 07/30/21 Continue ondansetron for nausea and loperamide for diarrhea as needed Labs, Dr. Lindi Adie visit, denosumab injection, on 10/06/21.  Dr. Lindi Adie has mentioned every 74-monthvisits with scans every 6 months. Likely restaging will be ordered by Dr. GLindi Adieat the August visit for November to coincide with November visit. Ms. GGuthridgewill see Dr. GLindi Adiewith labs, review restaging scans, and her denosumab injection on 12/29/21.  Next clinical pharmacy visit can be in February.  Can see clinical pharmacy sooner if needed. Ref[facility]lib to [facility] in the discussion, expressed understanding, and voiced agreement with the above plan. All questions were answered to her satisfaction. The patient was advised to contact the clinic at [phone] with any questions or concerns prior to her return visit.   I spent 30 minutes assessing and educating the patient.  Emily Wilson RPH-CPP, 07/14/2021  10:31 AM   **Disclaimer: This note was dictated with voice recognition software. Similar sounding words can inadvertently be transcribed and this note may contain transcription errors which may not have been corrected upon publication of note.**

## 2021-07-17 ENCOUNTER — Ambulatory Visit (INDEPENDENT_AMBULATORY_CARE_PROVIDER_SITE_OTHER): Payer: Medicare Other

## 2021-07-17 ENCOUNTER — Encounter: Payer: Self-pay | Admitting: Emergency Medicine

## 2021-07-17 ENCOUNTER — Ambulatory Visit: Payer: Self-pay

## 2021-07-17 ENCOUNTER — Encounter: Payer: Self-pay | Admitting: Pharmacist

## 2021-07-17 ENCOUNTER — Ambulatory Visit
Admission: EM | Admit: 2021-07-17 | Discharge: 2021-07-17 | Disposition: A | Discharge status: Home / Self Care | Payer: Medicare Other | Attending: Physician Assistant | Admitting: Physician Assistant

## 2021-07-17 DIAGNOSIS — R0782 Intercostal pain: Secondary | ICD-10-CM

## 2021-07-17 DIAGNOSIS — S2231XA Fracture of one rib, right side, initial encounter for closed fracture: Secondary | ICD-10-CM | POA: Diagnosis not present

## 2021-07-17 MED ORDER — OXYCODONE-ACETAMINOPHEN 5-325 MG PO TABS: 1.0000 | ORAL_TABLET | ORAL | 0 refills | Status: DC | PRN

## 2021-07-17 NOTE — Discharge Instructions (Addendum)
Return if any problems.  See your Physician for recheck  °

## 2021-07-17 NOTE — ED Triage Notes (Signed)
Patient c/o RT sided mid lower Rib pain that started yesterday.   Patient states " I was sneezing and felt a pop in my rib".   Patient endorses " I'm undergoing cancer treatment and some tumors have spread to my ribs".   Patient endorses pain upon movement and inhalation.   Patient has taken Vicodin with some relief of symptoms.

## 2021-07-17 NOTE — Telephone Encounter (Signed)
  Chief Complaint: rib pain  Symptoms: R rib pain severe from sneeze unable to raise R arm Frequency: yesterday Pertinent Negatives: NA Disposition: '[]'$ ED /'[x]'$ Urgent Care (no appt availability in office) / '[]'$ Appointment(In office/virtual)/ '[]'$  [facility]/ '[]'$ Home Care/ '[]'$ Refused Recommended Disposition /'[]'$ [facility]/ '[]'$  Follow-up with PCP Additional Notes: no appts available. Advised pt to go to UC to be seen.   Reason for Disposition  [1] Collarbone is painful AND [2] difficulty raising arm  Answer Assessment - Initial Assessment Questions 1. MECHANISM: "How did the injury happen?"     Sneezed and had severe pain 2. ONSET: "When did the injury happen?" (Minutes or hours ago)     yesterday 3. LOCATION: "Where on the chest is the injury located?"     R Back radiates to front  6. SEVERITY: "Any difficulty with breathing?"     Yes hurts but no worse SOB 8. PAIN: "Is there pain?" If Yes, ask: "How bad is the pain?"   (e.g., Scale 1-10; or mild, moderate, severe)     10  Protocols used: Chest Injury-A-AH

## 2021-07-17 NOTE — ED Provider Notes (Signed)
[name]    [name]: [id number] Arrival date & time: 07/17/21  1258      History   Chief Complaint Chief Complaint  Patient presents with   Rib Injury    HPI Emily Wilson is a 56 y.o. female.   Pt reports she sneezed last p.m. and had a pop and pain in her right back.  Patient reports she took a dose of pain medicine last p.m.  Patient reports continued pain today.  Patient has a past medical history of breast cancer with metastatic bone disease.  Patient reports she has lesions to her right ribs.  Denies any current difficulty breathing she denies any fever or chills.  Patient has discomfort with deep breath and with movement.  Patient denies any other areas of injury  The history is provided by the patient. No language interpreter was used.   Past Medical History:  Diagnosis Date   Anxiety state, unspecified    Arthritis    Breast cancer of lower-inner quadrant of right female breast [facility])    Carpal tunnel syndrome    Complication of anesthesia    woke up during ganglion cyst removal in her 20's, not woken up since   Diabetes mellitus without complication (HCC)    Type II   Diverticulosis    Heart murmur    History of radiation therapy 12/18/18- 01/30/19   Right Chest wall 25 fractions X 2Gy each to total 50 Gy, followed by a boost 10 Gy in 5 fractions.    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, without mention of thyrotoxic crisis or storm     Patient Active Problem List   Diagnosis Date Noted   History of shingles 01/09/2019   S/P mastectomy, right 11/16/2018   Malignant neoplasm metastatic to bone ([place]) 05/31/2018   Family history of breast cancer in female 03/13/2015   Malignant neoplasm of lower-inner quadrant of right breast of female, estrogen receptor positive ([place]) 02/25/2015   DM (diabetes mellitus), type 2 ([place])  02/25/2015   HLD (hyperlipidemia) 02/25/2015   Palpitations 01/26/2010   Anxiety state 05/26/2009   Hyperthyroidism 11/14/2006   Hypertension 11/14/2006    Past Surgical History:  Procedure Laterality Date   ABDOMINAL HYSTERECTOMY     ANTERIOR CRUCIATE LIGAMENT REPAIR Right 2007   ACL repair   APPENDECTOMY     CARPAL TUNNEL RELEASE Right    GANGLION CYST EXCISION Right    TOTAL MASTECTOMY Right 11/16/2018   Procedure: RIGHT MASTECTOMY;  Surgeon: Coralie Keens, MD;  Location: [place];  Service: General;  Laterality: Right;   TUBAL LIGATION      OB History   No obstetric history on file.      Home Medications    Prior to Admission medications   Medication Sig Start Date End Date Taking? Authorizing Provider  abemaciclib (VERZENIO) 50 MG tablet Take 1 tablet (50 mg total) by mouth 2 (two) times daily. Swallow tablets whole. Do not chew, crush, or split tablets before swallowing. 07/14/21  Yes [name], MD  denosumab (XGEVA) 120 MG/1.7ML SOLN injection Inject 120 mg into the skin every 3 (three) months.   Yes [provider]  insulin NPH Human (HUMULIN N) 100 UNIT/ML injection INJECT 12 UNITS INTO THE SKIN AT BEDTIME 07/23/20  Yes [name], MD  insulin regular (NOVOLIN  R) 100 units/mL injection Inject 0.12 mLs (12 Units total) into the skin 3 (three) times daily before meals. E.11.9. Hold dose for blood sugar less than 125 11/11/20  Yes Dorna Mai, MD  letrozole [facility]) 2.5 MG tablet TAKE 1 TABLET BY MOUTH EVERY DAY 10/30/20  Yes [name], MD  loperamide (IMODIUM) 2 MG capsule Take 2 mg by mouth as needed for diarrhea or loose stools. Take 2 tabs (4 mg) with first loose stool, then 1 tab (2 mg) with each additional loose stool. Do not take more than 8 tabs (16 mg) in a 24-hour period.   Yes [name], MD  metoprolol succinate (TOPROL-XL) 25 MG 24 hr tablet Take 1 tablet (25 mg total) by mouth daily. 04/14/21  Yes [name], MD   oxyCODONE-acetaminophen (PERCOCET) 5-325 MG tablet Take 1 tablet by mouth every 4 (four) hours as needed for severe pain. 07/17/21 07/17/22 Yes Caryl Ada K, PA-C  rosuvastatin (CRESTOR) 40 MG tablet TAKE 1 TABLET BY MOUTH EVERY DAY 06/19/21  Yes Dorna Mai, MD  Accu-Chek FastClix Lancets MISC USE TO TEST BLOOD SUGAR UP TO FOUR TIMES DAILY AS DIRECTED 06/30/20   [name], MD  ACCU-CHEK GUIDE test strip USE TO CHECK BLOOD SUGAR UP TO 4 TIMES A DAY. DX: E11.65 12/25/20   Dorna Mai, MD  BD INSULIN SYRINGE U/F 31G X 5/16" 0.5 ML MISC USE TO ADMINISTER INSULIN 3 TIMES DAILY WITH MEALS 01/07/21   Dorna Mai, MD  bismuth subsalicylate (PEPTO BISMOL) 262 MG/15ML suspension Take 30 mLs by mouth every 6 (six) hours as needed. Patient not taking: Reported on 07/14/2021    [provider]  diclofenac (VOLTAREN) 75 MG EC tablet Take 1 tablet (75 mg total) by mouth 2 (two) times daily as needed. 04/23/21   [name], MD  dicyclomine (BENTYL) 20 MG tablet Take 1 tablet (20 mg total) by mouth every 6 (six) hours. Patient not taking: Reported on 07/14/2021 05/20/21   Dorna Mai, MD  gabapentin (NEURONTIN) 100 MG capsule Take 3 capsules (300 mg total) by mouth 3 (three) times daily. 02/06/19   [name], MD  meclizine (ANTIVERT) 25 MG tablet Take 1 tablet (25 mg total) by mouth 3 (three) times daily as needed for dizziness. Patient not taking: Reported on 07/14/2021 04/30/21   Francene Finders, PA-C  Milk Thistle 1000 MG CAPS Take 1,000 mg by mouth daily as needed. Patient not taking: Reported on 07/14/2021    [provider]  ondansetron (ZOFRAN ODT) 8 MG disintegrating tablet Take 1 tablet (8 mg total) by mouth every 8 (eight) hours as needed for nausea or vomiting. 04/20/21   [name], MD  PEG-KCl-NaCl-NaSulf-Na Asc-C (PLENVU) 140 g SOLR Take 1 kit by mouth as directed. Use coupon: BIN: [id number] [facility]: CNRX Group: [id number] ID: [id number] Patient not taking:  Reported on 07/14/2021 06/09/21   Doran Stabler, MD    Family History Family History  Problem Relation Age of Onset   Diabetes Mother    Bladder Cancer Mother 23       smoker   Other Mother 20       TAH for unspecified reason   Multiple sclerosis Mother    Kidney failure Mother    Cancer Father 94       lymphatic/tonsil cancer - in remission; former smoker   Diabetes Sister    Diabetes Sister    Heart attack Brother    COPD Maternal Grandmother  smoker   Emphysema Maternal Grandmother        smoker   Diabetes Maternal Grandmother    Stroke Maternal Grandfather    Diabetes Paternal Grandmother    Dementia Maternal Uncle    COPD Maternal Uncle        smoker   Multiple sclerosis Paternal Aunt    Breast cancer Paternal Aunt        dx. late 43s - early 60s    Social History Social History   Tobacco Use   Smoking status: Former    Packs/day: 3.00    Years: 30.00    Pack years: 90.00    Types: Cigarettes    Quit date: 10/24/2011    Years since quitting: 9.7   Smokeless tobacco: Never  Vaping Use   Vaping Use: Never used  Substance Use Topics   Alcohol use: No   Drug use: Never     Allergies   Doxycycline, Naproxen, Codeine, Hydrocodone-acetaminophen, Ibuprofen, Lantus [insulin glargine], Meloxicam, Propoxyphene n-acetaminophen, Sulfa antibiotics, Sulfonamide derivatives, and Tramadol   Review of Systems Review of Systems  All other systems reviewed and are negative.   Physical Exam Triage Vital Signs ED Triage Vitals  Enc Vitals Group     BP 07/17/21 1420 121/85     Pulse Rate 07/17/21 1420 97     Resp 07/17/21 1420 16     Temp 07/17/21 1420 97.7 F (36.5 C)     Temp Source 07/17/21 1420 Oral     SpO2 07/17/21 1420 93 %     Weight --      Height --      Head Circumference --      Peak Flow --      Pain Score 07/17/21 1425 7     Pain Loc --      Pain Edu? --      Excl. in [place]? --    No data found.  Updated Vital Signs BP 121/85 (BP  Location: Right Arm)   Pulse 97   Temp 97.7 F (36.5 C) (Oral)   Resp 16   SpO2 93%   Visual Acuity Right Eye Distance:   Left Eye Distance:   Bilateral Distance:    Right Eye Near:   Left Eye Near:    Bilateral Near:     Physical Exam Vitals and nursing note reviewed.  Constitutional:      Appearance: She is well-developed.  HENT:     Head: Normocephalic.  Cardiovascular:     Rate and Rhythm: Normal rate.     Comments: Tender posterior lateral right chest/back. Pulmonary:     Effort: Pulmonary effort is normal.  Abdominal:     General: There is no distension.  Musculoskeletal:        General: Normal range of motion.     Cervical back: Normal range of motion.  Neurological:     General: No focal deficit present.     Mental Status: She is alert and oriented to person, place, and time.     UC Treatments / Results  Labs (all labs ordered are listed, but only abnormal results are displayed) Labs Reviewed - No data to display  EKG   Radiology DG Ribs Unilateral W/Chest Right  Result Date: 07/17/2021 CLINICAL DATA:  Rib pain EXAM: RIGHT RIBS AND CHEST - 3 VIEW COMPARISON:  CT chest, abdomen and pelvis dated June 16, 2021 FINDINGS: Multiple permeative appearing right rib lesions involving the posterior 6th 7th and 12th ribs and  lateral 4th rib. Probable nondisplaced pathologic fracture of the posterior seventh right rib. Additional permeative appearing lesions are seen in the anterior left 2nd rib, inferior left scapula and right humeral head. No fracture or other bone lesions are seen involving the ribs. There is no evidence of pneumothorax or pleural effusion. Background emphysema with no focal consolidation. No pleural effusion or pneumothorax. Heart size and mediastinal contours are within normal limits. IMPRESSION: Findings compatible with osseous metastatic disease. Probable nondisplaced pathologic fracture of the posterior seventh right rib. Electronically Signed    By: Yetta Glassman M.D.   On: 07/17/2021 15:06    Procedures Procedures (including critical care time)  Medications Ordered in UC Medications - No data to display  Initial Impression / Assessment and Plan / UC Course  I have reviewed the triage vital signs and the nursing notes.  Pertinent labs & imaging results that were available during my care of the patient were reviewed by me and considered in my medical decision making (see chart for details).     MDM x-ray shows probable nondisplaced pathologic fracture of seventh rib as well as osseous metastatic disease.  Patient is given a prescription for Percocet she is advised to follow-up with her primary care physician.  Patient advised to take deep breaths she is to go to the emergency department if any fever or difficulty breathing. Final Clinical Impressions(s) / UC Diagnoses   Final diagnoses:  Closed fracture of one rib of right side, initial encounter     Discharge Instructions      Return if any problems.  See your Physician for recheck    ED Prescriptions     Medication Sig Dispense Auth. Provider   oxyCODONE-acetaminophen (PERCOCET) 5-325 MG tablet Take 1 tablet by mouth every 4 (four) hours as needed for severe pain. 20 tablet Fransico Meadow, [place]      I have reviewed the PDMP during this encounter.   Fransico Meadow, [place] 07/17/21 1527

## 2021-07-18 ENCOUNTER — Encounter: Payer: Self-pay | Admitting: Emergency Medicine

## 2021-07-18 ENCOUNTER — Ambulatory Visit
Admission: EM | Admit: 2021-07-18 | Discharge: 2021-07-18 | Disposition: A | Discharge status: Home / Self Care | Payer: Medicare Other | Attending: Family Medicine | Admitting: Family Medicine

## 2021-07-18 DIAGNOSIS — S2249XD Multiple fractures of ribs, unspecified side, subsequent encounter for fracture with routine healing: Secondary | ICD-10-CM

## 2021-07-18 DIAGNOSIS — Z889 Allergy status to unspecified drugs, medicaments and biological substances status: Secondary | ICD-10-CM | POA: Diagnosis not present

## 2021-07-18 MED ORDER — PREDNISONE 20 MG PO TABS: 40.0000 mg | ORAL_TABLET | Freq: Every day | ORAL | 0 refills | Status: AC

## 2021-07-18 MED ORDER — HYDROMORPHONE HCL 4 MG PO TABS: 4.0000 mg | ORAL_TABLET | ORAL | 0 refills | Status: DC | PRN

## 2021-07-18 NOTE — ED Triage Notes (Signed)
Patient seen yesterday, diagnosed with a fractured rib, given Oxycodone for pain.  Patient has developed a rash and severe itching.

## 2021-07-18 NOTE — ED Provider Notes (Signed)
[name]    [name]: [id number] Arrival date & time: 07/18/21  0109      History   Chief Complaint Chief Complaint  Patient presents with   Allergic Reaction    HPI Emily Wilson is a 56 y.o. female.    Allergic Reaction Here for allergic reaction to percocet; prescribed yesterday here for rib fracture. Took a dose about 9 PM last night, and an hour later started itching. No dyspnea or fever.  Has multiple drug allergies; reviewed list with her, and she gives sometimes confusing history of reaction or not reaction to various meds. Has not made a list of her reactions.  Is already taking diclofenac BID.  Has metastatic breast cancer, with multiple mets on ribs on yesterday's xrays.    Past Medical History:  Diagnosis Date   Anxiety state, unspecified    Arthritis    Breast cancer of lower-inner quadrant of right female breast [facility])    Carpal tunnel syndrome    Complication of anesthesia    woke up during ganglion cyst removal in her 20's, not woken up since   Diabetes mellitus without complication (HCC)    Type II   Diverticulosis    Heart murmur    History of radiation therapy 12/18/18- 01/30/19   Right Chest wall 25 fractions X 2Gy each to total 50 Gy, followed by a boost 10 Gy in 5 fractions.    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, without mention of thyrotoxic crisis or storm     Patient Active Problem List   Diagnosis Date Noted   History of shingles 01/09/2019   S/P mastectomy, right 11/16/2018   Malignant neoplasm metastatic to bone ([place]) 05/31/2018   Family history of breast cancer in female 03/13/2015   Malignant neoplasm of lower-inner quadrant of right breast of female, estrogen receptor positive ([place]) 02/25/2015   DM (diabetes mellitus), type 2 ([place]) 02/25/2015   HLD (hyperlipidemia) 02/25/2015   Palpitations  01/26/2010   Anxiety state 05/26/2009   Hyperthyroidism 11/14/2006   Hypertension 11/14/2006    Past Surgical History:  Procedure Laterality Date   ABDOMINAL HYSTERECTOMY     ANTERIOR CRUCIATE LIGAMENT REPAIR Right 2007   ACL repair   APPENDECTOMY     CARPAL TUNNEL RELEASE Right    GANGLION CYST EXCISION Right    TOTAL MASTECTOMY Right 11/16/2018   Procedure: RIGHT MASTECTOMY;  Surgeon: Emily Keens, MD;  Location: [place];  Service: General;  Laterality: Right;   TUBAL LIGATION      OB History   No obstetric history on file.      Home Medications    Prior to Admission medications   Medication Sig Start Date End Date Taking? Authorizing Provider  abemaciclib (VERZENIO) 50 MG tablet Take 1 tablet (50 mg total) by mouth 2 (two) times daily. Swallow tablets whole. Do not chew, crush, or split tablets before swallowing. 07/14/21  Yes Emily Lose, MD  Accu-Chek FastClix Lancets MISC USE TO TEST BLOOD SUGAR UP TO FOUR TIMES DAILY AS DIRECTED 06/30/20  Yes Emily Bang, MD  ACCU-CHEK GUIDE test strip USE TO CHECK BLOOD SUGAR UP TO 4 TIMES A DAY. DX: E11.65 12/25/20  Yes Emily Mai, MD  BD INSULIN SYRINGE U/F 31G X 5/16" 0.5 ML MISC USE TO ADMINISTER INSULIN 3 TIMES DAILY WITH MEALS  01/07/21  Yes Emily Mai, MD  denosumab (XGEVA) 120 MG/1.7ML SOLN injection Inject 120 mg into the skin every 3 (three) months.   Yes [provider]  diclofenac (VOLTAREN) 75 MG EC tablet Take 1 tablet (75 mg total) by mouth 2 (two) times daily as needed. 04/23/21  Yes Emily Lose, MD  gabapentin (NEURONTIN) 100 MG capsule Take 3 capsules (300 mg total) by mouth 3 (three) times daily. 02/06/19  Yes Emily Lose, MD  HYDROmorphone (DILAUDID) 4 MG tablet Take 1 tablet (4 mg total) by mouth every 4 (four) hours as needed for severe pain. 07/18/21  Yes Resean Brander, Gwenlyn Perking, MD  insulin regular (NOVOLIN R) 100 units/mL injection Inject 0.12 mLs (12 Units total) into the skin 3 (three)  times daily before meals. E.11.9. Hold dose for blood sugar less than 125 11/11/20  Yes Emily Mai, MD  letrozole [facility]) 2.5 MG tablet TAKE 1 TABLET BY MOUTH EVERY DAY 10/30/20  Yes Emily Lose, MD  loperamide (IMODIUM) 2 MG capsule Take 2 mg by mouth as needed for diarrhea or loose stools. Take 2 tabs (4 mg) with first loose stool, then 1 tab (2 mg) with each additional loose stool. Do not take more than 8 tabs (16 mg) in a 24-hour period.   Yes Emily Lose, MD  ondansetron (ZOFRAN ODT) 8 MG disintegrating tablet Take 1 tablet (8 mg total) by mouth every 8 (eight) hours as needed for nausea or vomiting. 04/20/21  Yes Emily Lose, MD  predniSONE (DELTASONE) 20 MG tablet Take 2 tablets (40 mg total) by mouth daily with breakfast for 3 days. 07/18/21 07/21/21 Yes Emily Henle, MD  rosuvastatin (CRESTOR) 40 MG tablet TAKE 1 TABLET BY MOUTH EVERY DAY 06/19/21  Yes Emily Mai, MD  bismuth subsalicylate (PEPTO BISMOL) 262 MG/15ML suspension Take 30 mLs by mouth every 6 (six) hours as needed. Patient not taking: Reported on 07/14/2021    [provider]  dicyclomine (BENTYL) 20 MG tablet Take 1 tablet (20 mg total) by mouth every 6 (six) hours. Patient not taking: Reported on 07/14/2021 05/20/21   Emily Mai, MD  insulin NPH Human (HUMULIN N) 100 UNIT/ML injection INJECT 12 UNITS INTO THE SKIN AT BEDTIME 07/23/20   Emily Bang, MD  meclizine (ANTIVERT) 25 MG tablet Take 1 tablet (25 mg total) by mouth 3 (three) times daily as needed for dizziness. Patient not taking: Reported on 07/14/2021 04/30/21   Emily Finders, PA-C  metoprolol succinate (TOPROL-XL) 25 MG 24 hr tablet Take 1 tablet (25 mg total) by mouth daily. 04/14/21   Emily Lance, MD  Milk Thistle 1000 MG CAPS Take 1,000 mg by mouth daily as needed. Patient not taking: Reported on 07/14/2021    [provider]  PEG-KCl-NaCl-NaSulf-Na Asc-C (PLENVU) 140 g SOLR Take 1 kit by mouth as directed. Use  coupon: BIN: [id number] [facility]: [employer] Group: [id number] ID: [id number] Patient not taking: Reported on 07/14/2021 06/09/21   Emily Stabler, MD    Family History Family History  Problem Relation Age of Onset   Diabetes Mother    Bladder Cancer Mother 4       smoker   Other Mother 60       TAH for unspecified reason   Multiple sclerosis Mother    Kidney failure Mother    Cancer Father 23       lymphatic/tonsil cancer - in remission; former smoker   Diabetes Sister    Diabetes Sister  Heart attack Brother    COPD Maternal Grandmother        smoker   Emphysema Maternal Grandmother        smoker   Diabetes Maternal Grandmother    Stroke Maternal Grandfather    Diabetes Paternal Grandmother    Dementia Maternal Uncle    COPD Maternal Uncle        smoker   Multiple sclerosis Paternal Aunt    Breast cancer Paternal Aunt        dx. late 42s - early 11s    Social History Social History   Tobacco Use   Smoking status: Former    Packs/day: 3.00    Years: 30.00    Pack years: 90.00    Types: Cigarettes    Quit date: 10/24/2011    Years since quitting: 9.7   Smokeless tobacco: Never  Vaping Use   Vaping Use: Never used  Substance Use Topics   Alcohol use: No   Drug use: Never     Allergies   Doxycycline, Naproxen, Oxycodone-acetaminophen, Codeine, Hydrocodone-acetaminophen, Ibuprofen, Lantus [insulin glargine], Meloxicam, Propoxyphene n-acetaminophen, Sulfa antibiotics, Sulfonamide derivatives, and Tramadol   Review of Systems Review of Systems   Physical Exam Triage Vital Signs ED Triage Vitals  Enc Vitals Group     BP 07/18/21 0827 110/62     Pulse Rate 07/18/21 0827 85     Resp 07/18/21 0827 18     Temp 07/18/21 0827 97.9 F (36.6 C)     Temp Source 07/18/21 0827 Oral     SpO2 07/18/21 0827 95 %     Weight --      Height --      Head Circumference --      Peak Flow --      Pain Score 07/18/21 0828 0     Pain Loc --      Pain Edu? --      Excl. in [place]?  --    No data found.  Updated Vital Signs BP 110/62 (BP Location: Left Arm)   Pulse 85   Temp 97.9 F (36.6 C) (Oral)   Resp 18   SpO2 95%   Visual Acuity Right Eye Distance:   Left Eye Distance:   Bilateral Distance:    Right Eye Near:   Left Eye Near:    Bilateral Near:     Physical Exam Vitals reviewed.  Constitutional:      General: She is not in acute distress.    Appearance: She is not ill-appearing, toxic-appearing or diaphoretic.  HENT:     Mouth/Throat:     Mouth: Mucous membranes are moist.  Eyes:     Extraocular Movements: Extraocular movements intact.     Pupils: Pupils are equal, round, and reactive to light.  Cardiovascular:     Rate and Rhythm: Normal rate and regular rhythm.  Pulmonary:     Effort: Pulmonary effort is normal.     Breath sounds: Normal breath sounds. No stridor. No wheezing, rhonchi or rales.  Skin:    Coloration: Skin is not jaundiced or pale.  Neurological:     General: No focal deficit present.     Mental Status: She is alert and oriented to person, place, and time.  Psychiatric:        Behavior: Behavior normal.     UC Treatments / Results  Labs (all labs ordered are listed, but only abnormal results are displayed) Labs Reviewed - No data to display  EKG  Radiology DG Ribs Unilateral W/Chest Right  Result Date: 07/17/2021 CLINICAL DATA:  Rib pain EXAM: RIGHT RIBS AND CHEST - 3 VIEW COMPARISON:  CT chest, abdomen and pelvis dated June 16, 2021 FINDINGS: Multiple permeative appearing right rib lesions involving the posterior 6th 7th and 12th ribs and lateral 4th rib. Probable nondisplaced pathologic fracture of the posterior seventh right rib. Additional permeative appearing lesions are seen in the anterior left 2nd rib, inferior left scapula and right humeral head. No fracture or other bone lesions are seen involving the ribs. There is no evidence of pneumothorax or pleural effusion. Background emphysema with no focal  consolidation. No pleural effusion or pneumothorax. Heart size and mediastinal contours are within normal limits. IMPRESSION: Findings compatible with osseous metastatic disease. Probable nondisplaced pathologic fracture of the posterior seventh right rib. Electronically Signed   By: Yetta Glassman M.D.   On: 07/17/2021 15:06    Procedures Procedures (including critical care time)  Medications Ordered in UC Medications - No data to display  Initial Impression / Assessment and Plan / UC Course  I have reviewed the triage vital signs and the nursing notes.  Pertinent labs & imaging results that were available during my care of the patient were reviewed by me and considered in my medical decision making (see chart for details).     She states acetaminophen causes abd pain. States toradol gave her a rash and abd pain. I have added it and oxycodone to her allergy.  OK on pmp, had not filled any narcotics since 09/2020. She stated that a generic for vicodin did not give her prob them, but then she reiterated she had had trouble taking hydrocodone, ?hives or abd pain or both.   I have sent a small prescription of hydromorphone to see if she would tolerate that. I told her we don't have much more to offer her for pain relief from here.  She will continue antihistamines prn.  Final Clinical Impressions(s) / UC Diagnoses   Final diagnoses:  Closed fracture of five ribs with routine healing, unspecified laterality, subsequent encounter  Drug allergy     Discharge Instructions      Take prednisone 20 mg --2 daily for 3 days.   Continue your diclofenac  Hydromorphone 4 mg--1 tablet every 4 hours as needed for pain. I am hoping that this will not cross react for you.   Please make a list for your wallet/purse of all the meds you have had reactions to, with the reaction you have had. Bring that with you to any doctor visits. You may want to show it to your pharmacist also.    ED  Prescriptions     Medication Sig Dispense Auth. Provider   predniSONE (DELTASONE) 20 MG tablet Take 2 tablets (40 mg total) by mouth daily with breakfast for 3 days. 6 tablet Zohar Laing, Gwenlyn Perking, MD   HYDROmorphone (DILAUDID) 4 MG tablet Take 1 tablet (4 mg total) by mouth every 4 (four) hours as needed for severe pain. 10 tablet Windy Carina Gwenlyn Perking, MD      I have reviewed the PDMP during this encounter.   Emily Henle, MD 07/18/21 [phone]

## 2021-07-18 NOTE — Discharge Instructions (Addendum)
Take prednisone 20 mg --2 daily for 3 days.   Continue your diclofenac  Hydromorphone 4 mg--1 tablet every 4 hours as needed for pain. I am hoping that this will not cross react for you.   Please make a list for your wallet/purse of all the meds you have had reactions to, with the reaction you have had. Bring that with you to any doctor visits. You may want to show it to your pharmacist also.

## 2021-07-21 ENCOUNTER — Telehealth: Payer: Self-pay | Admitting: *Deleted

## 2021-07-21 ENCOUNTER — Other Ambulatory Visit: Payer: Medicare Other

## 2021-07-21 ENCOUNTER — Ambulatory Visit: Payer: Medicare Other | Admitting: Pharmacist

## 2021-07-21 ENCOUNTER — Ambulatory Visit: Payer: Medicare Other

## 2021-07-21 ENCOUNTER — Telehealth: Payer: Self-pay | Admitting: Gastroenterology

## 2021-07-21 NOTE — Telephone Encounter (Signed)
Received call from pt with complaint of recent rib fracture over the weekend.  Pt states she sneezed and felt a sharp pain, was seen at urgent care and diagnosed with rib fracture due to metastatic disease.  Pt requesting f/u with provider to discuss future tx options.  Appt scheduled, pt verbalized understanding of appt date and time.

## 2021-07-21 NOTE — Telephone Encounter (Signed)
Inbound call from patient stating that she has broke a rib and is scheduled to have Endo and Colon on 5/31. Patient is seeking advice if Dr. Loletha Wilson would like her to reschedule or if she is okay to come in. Please advise.

## 2021-07-21 NOTE — Telephone Encounter (Signed)
Patient called to cancel appointment. Patient was due for procedure 07/22/21. Patient states she will reschedule after she speaks with her pcp.  Thank You.

## 2021-07-21 NOTE — Telephone Encounter (Signed)
Yes, it would be best not to have the procedures on 07/22/2021 after a recent rib fracture.  She might be uncomfortable laying on her side for procedures, and the pain might also affect her ability to breathe comfortably, thereby increasing the risk of sedation.  Procedures should be rescheduled for about 4 to 6 weeks from now as the schedule allows.  HD

## 2021-07-21 NOTE — Telephone Encounter (Signed)
Patient called back and requested to cancel the procedure.

## 2021-07-22 ENCOUNTER — Encounter: Payer: Medicare Other | Admitting: Gastroenterology

## 2021-07-22 ENCOUNTER — Other Ambulatory Visit: Payer: Self-pay

## 2021-07-22 ENCOUNTER — Telehealth: Payer: Self-pay

## 2021-07-22 ENCOUNTER — Inpatient Hospital Stay (HOSPITAL_BASED_OUTPATIENT_CLINIC_OR_DEPARTMENT_OTHER): Payer: Medicare Other | Admitting: Adult Health

## 2021-07-22 VITALS — BP 126/77 | HR 97 | Temp 97.7°F | Resp 18 | Ht <= 58 in | Wt 160.7 lb

## 2021-07-22 DIAGNOSIS — C7951 Secondary malignant neoplasm of bone: Secondary | ICD-10-CM

## 2021-07-22 DIAGNOSIS — C50311 Malignant neoplasm of lower-inner quadrant of right female breast: Secondary | ICD-10-CM

## 2021-07-22 DIAGNOSIS — Z170 Estrogen receptor positive status [ER+]: Secondary | ICD-10-CM | POA: Diagnosis not present

## 2021-07-22 NOTE — Telephone Encounter (Signed)
error 

## 2021-07-22 NOTE — Progress Notes (Signed)
[facility] up:    Emily Wilson, [address] [place] [address]   DIAGNOSIS:  Cancer Staging  Malignant neoplasm of lower-inner quadrant of right breast of female, estrogen receptor positive ([place]) Staging form: Breast, AJCC 7th Edition - Pathologic stage from 12/07/2018: Stage IV (yT4b, NX, M1) - Signed by Eppie Gibson, MD on 12/07/2018 Stage prefix: Post-therapy Laterality: Right Tumor grade (Scarff-Bloom-Richardson system): G1 Estrogen receptor status: Positive Progesterone receptor status: Negative HER2 status: Negative   SUMMARY OF ONCOLOGIC HISTORY: Oncology History  Malignant neoplasm of lower-inner quadrant of right breast of female, estrogen receptor positive ([place])  02/19/2015 Mammogram   Right breast irregular mass lower inner quadrant 2.2 x 1.3 x 1.7 cm with architectural distortion and skin/nipple retraction, T2 N0 stage II a clinical stage, additional benign cysts largest 1.2 cm    02/20/2015 Initial Diagnosis   Right breast biopsy 5:00: Invasive ductal carcinoma grade 2, perineural invasion present, ER 90%, PR 70%, HER-2 negative ratio 0.95, Ki-67 15%     03/07/2015 Breast MRI   right breast inferior subareolar mass measuring 2.2 x 2.4 x 2.5 cm with skin and nipple enhancement, extending inferiorly and laterally is intraductal enhancement over a distance of 4 cm, several level I right axillary lymph nodes with cortical thickening    03/13/2015 -  Anti-estrogen oral therapy   Neoadjuvant antiestrogen therapy with tamoxifen 20 mg daily (because the patient did not want to undergo surgery immediately for multiple family reasons) stopped in late 2017; anastrozole started 05/11/18   05/23/2018 Imaging   CT CAP: 2.9 cm spiculated soft tissue density central right breast, no evidence of soft tissue metastatic disease within the chest abdomen pelvis.  Subcentimeter sclerotic bone lesions T10, left pubis, right posterior  ilium Bone scan: Foci of uptake left frontal calvarium, T-spine, right ilium    10/04/2018 -  Anti-estrogen oral therapy   Ibrance with anastrozole   11/16/2018 Surgery   Right mastectomy Ninfa Linden): IDC, grade 1, 5.2cm, grossly involving underlying skin, clear margins.   12/07/2018 Cancer Staging   Staging form: Breast, AJCC 7th Edition - Pathologic stage from 12/07/2018: Stage IV (yT4b, NX, M1) - Signed by Eppie Gibson, MD on 12/07/2018    12/19/2018 - 01/30/2019 Radiation Therapy   Adjuvant radiation   05/16/2019 Miscellaneous   Caris molecular testing: ER positive, PI K3 CA mutation present, HER-2 negative, ER positive TMB low BRCA1 not detected, ESR 1 not detected, PD-L1 negative   11/19/2020 - 12/02/2020 Radiation Therapy   11/19/2020 through 12/02/2020 Site Technique Total Dose (Gy) Dose per Fx (Gy) Completed Fx Beam Energies  Thoracic Spine: Spine_ T8-T11 3D 30/30 3 10/10 10X, 15X  Femur Left: Ext_Lt_femur Complex 30/30 3 10/10 10X  Lumbar Spine: Spine_L4-S4 3D 30/30 3 10/10 10X, 15X      CURRENT THERAPY: Verzenio. Letrozole  INTERVAL HISTORY: Emily Wilson 55 y.o. female returns for follow up of rib pain and fracture.  While at urgent care over the weekend she was diagnosed with rib fracture after sneezing and developing subsequent sharp pain.  She has known bone metastases and is experiencing increased pain in this area.  She denies increased shortness of breath, fevers, chills, or other concerns.    Patient Active Problem List   Diagnosis Date Noted   History of shingles 01/09/2019   S/P mastectomy, right 11/16/2018   Malignant neoplasm metastatic to bone [facility]) 05/31/2018   Family history of breast cancer in female 03/13/2015   Malignant  neoplasm of lower-inner quadrant of right breast of female, estrogen receptor positive ([place]) 02/25/2015   DM (diabetes mellitus), type 2 ([place]) 02/25/2015   HLD (hyperlipidemia) 02/25/2015   Palpitations 01/26/2010   Anxiety state  05/26/2009   Hyperthyroidism 11/14/2006   Hypertension 11/14/2006    is allergic to doxycycline, naproxen, oxycodone-acetaminophen, codeine, hydrocodone-acetaminophen, ibuprofen, lantus [insulin glargine], meloxicam, propoxyphene n-acetaminophen, sulfa antibiotics, sulfonamide derivatives, and tramadol.  MEDICAL HISTORY: Past Medical History:  Diagnosis Date   Anxiety state, unspecified    Arthritis    Breast cancer of lower-inner quadrant of right female breast [facility])    Carpal tunnel syndrome    Complication of anesthesia    woke up during ganglion cyst removal in her 20's, not woken up since   Diabetes mellitus without complication (HCC)    Type II   Diverticulosis    Heart murmur    History of radiation therapy 12/18/18- 01/30/19   Right Chest wall 25 fractions X 2Gy each to total 50 Gy, followed by a boost 10 Gy in 5 fractions.    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, without mention of thyrotoxic crisis or storm     SURGICAL HISTORY: Past Surgical History:  Procedure Laterality Date   ABDOMINAL HYSTERECTOMY     ANTERIOR CRUCIATE LIGAMENT REPAIR Right 2007   ACL repair   APPENDECTOMY     CARPAL TUNNEL RELEASE Right    GANGLION CYST EXCISION Right    TOTAL MASTECTOMY Right 11/16/2018   Procedure: RIGHT MASTECTOMY;  Surgeon: Coralie Keens, MD;  Location: [facility];  Service: General;  Laterality: Right;   TUBAL LIGATION      SOCIAL HISTORY: Social History   Socioeconomic History   Marital status: Divorced    Spouse name: Not on file   Number of children: 2   Years of education: Not on file   Highest education level: Not on file  Occupational History   Occupation: Disabled  Tobacco Use   Smoking status: Former    Packs/day: 3.00    Years: 30.00    Pack years: 90.00    Types: Cigarettes    Quit date: 10/24/2011    Years since quitting: 9.7    Smokeless tobacco: Never  Vaping Use   Vaping Use: Never used  Substance and Sexual Activity   Alcohol use: No   Drug use: Never   Sexual activity: Not Currently  Other Topics Concern   Not on file  Social History Narrative   In [place] w/boyfriend   Works Chiropractor, Scientist, water quality   Social Determinants of Radio broadcast assistant Strain: Medium Risk   Difficulty of Paying Living Expenses: Somewhat hard  Food Insecurity: No Food Insecurity   Worried About Charity fundraiser in the Last Year: Never true   Arboriculturist in the Last Year: Never true  Transportation Needs: No Transportation Needs   Lack of Transportation (Medical): No   Lack of Transportation (Non-Medical): No  Physical Activity: Inactive   Days of Exercise per Week: 0 days   Minutes of Exercise per Session: 0 min  Stress: Stress Concern Present   Feeling of Stress : Very much  Social Connections: Socially Isolated   Frequency of Communication with Friends and Family: More than three times a week   Frequency of Social Gatherings with Friends and Family:  More than three times a week   Attends Religious Services: Never   Active Member of Clubs or Organizations: No   Attends Archivist Meetings: Never   Marital Status: Divorced  Human resources officer Violence: Not At Risk   Fear of Current or Ex-Partner: No   Emotionally Abused: No   Physically Abused: No   Sexually Abused: No    FAMILY HISTORY: Family History  Problem Relation Age of Onset   Diabetes Mother    Bladder Cancer Mother 51       smoker   Other Mother 35       TAH for unspecified reason   Multiple sclerosis Mother    Kidney failure Mother    Cancer Father 30       lymphatic/tonsil cancer - in remission; former smoker   Diabetes Sister    Diabetes Sister    Heart attack Brother    COPD Maternal Grandmother        smoker   Emphysema Maternal Grandmother        smoker   Diabetes Maternal Grandmother    Stroke Maternal Grandfather     Diabetes Paternal Grandmother    Dementia Maternal Uncle    COPD Maternal Uncle        smoker   Multiple sclerosis Paternal Aunt    Breast cancer Paternal Aunt        dx. late 89s - early 37s    Review of Systems  Constitutional:  Negative for appetite change, chills, fatigue, fever and unexpected weight change.  HENT:   Negative for hearing loss, lump/mass and trouble swallowing.   Eyes:  Negative for eye problems and icterus.  Respiratory:  Negative for chest tightness, cough and shortness of breath.   Cardiovascular:  Negative for chest pain, leg swelling and palpitations.  Gastrointestinal:  Negative for abdominal distention, abdominal pain, constipation, diarrhea, nausea and vomiting.  Endocrine: Negative for hot flashes.  Genitourinary:  Negative for difficulty urinating.   Musculoskeletal:  Negative for arthralgias.  Skin:  Negative for itching and rash.  Neurological:  Negative for dizziness, extremity weakness, headaches and numbness.  Hematological:  Negative for adenopathy. Does not bruise/bleed easily.  Psychiatric/Behavioral:  Negative for depression. The patient is not nervous/anxious.      PHYSICAL EXAMINATION  ECOG PERFORMANCE STATUS: 1 - Symptomatic but completely ambulatory  Vitals:   07/22/21 1055  BP: 126/77  Pulse: 97  Resp: 18  Temp: 97.7 F (36.5 C)  SpO2: 98%    Physical Exam Constitutional:      General: She is not in acute distress.    Appearance: Normal appearance. She is not toxic-appearing.  HENT:     Head: Normocephalic and atraumatic.  Eyes:     General: No scleral icterus. Cardiovascular:     Rate and Rhythm: Normal rate and regular rhythm.     Pulses: Normal pulses.     Heart sounds: Normal heart sounds.  Pulmonary:     Effort: Pulmonary effort is normal.     Breath sounds: Normal breath sounds.  Abdominal:     General: Abdomen is flat. Bowel sounds are normal. There is no distension.     Palpations: Abdomen is soft.      Tenderness: There is no abdominal tenderness.  Musculoskeletal:        General: No swelling.     Cervical back: Neck supple.  Lymphadenopathy:     Cervical: No cervical adenopathy.  Skin:    General: Skin is warm  and dry.     Findings: No rash.  Neurological:     General: No focal deficit present.     Mental Status: She is alert.  Psychiatric:        Mood and Affect: Mood normal.        Behavior: Behavior normal.    LABORATORY DATA:  CBC    Component Value Date/Time   WBC 4.3 07/14/2021 0932   WBC 4.9 11/10/2018 1103   RBC 4.30 07/14/2021 0932   HGB 13.9 07/14/2021 0932   HGB 13.7 04/30/2021 1033   HCT 41.0 07/14/2021 0932   HCT 40.4 04/30/2021 1033   PLT 342 07/14/2021 0932   PLT 332 04/30/2021 1033   MCV 95.3 07/14/2021 0932   MCV 95 04/30/2021 1033   MCH 32.3 07/14/2021 0932   MCHC 33.9 07/14/2021 0932   RDW 13.2 07/14/2021 0932   RDW 12.1 04/30/2021 1033   LYMPHSABS 0.8 07/14/2021 0932   LYMPHSABS 0.6 (L) 04/30/2021 1033   MONOABS 0.8 07/14/2021 0932   EOSABS 0.2 07/14/2021 0932   EOSABS 0.2 04/30/2021 1033   BASOSABS 0.1 07/14/2021 0932   BASOSABS 0.0 04/30/2021 1033    CMP     Component Value Date/Time   NA 139 07/14/2021 0932   NA 145 (H) 04/30/2021 1033   K 4.2 07/14/2021 0932   CL 107 07/14/2021 0932   CO2 27 07/14/2021 0932   GLUCOSE 86 07/14/2021 0932   BUN 18 07/14/2021 0932   BUN 8 04/30/2021 1033   CREATININE 0.76 07/14/2021 0932   CALCIUM 9.4 07/14/2021 0932   PROT 7.3 07/14/2021 0932   PROT 6.7 04/30/2021 1033   ALBUMIN 4.0 07/14/2021 0932   ALBUMIN 4.3 04/30/2021 1033   AST 17 07/14/2021 0932   ALT 17 07/14/2021 0932   ALKPHOS 107 07/14/2021 0932   BILITOT 0.4 07/14/2021 0932   GFRNONAA >60 07/14/2021 0932   GFRAA >60 11/02/2019 1231      ASSESSMENT and THERAPY PLAN:   Malignant neoplasm of lower-inner quadrant of right breast of female, estrogen receptor positive ([place]) Right breast irregular mass lower inner quadrant  retroareolar 02/19/2015: 2.2 x 1.3 x 1.7 cm with architectural distortion and skin/nipple retraction, T2 N0 stage II a clinical stage, additional benign cysts largest 1.2 cm Right breast biopsy 02/20/2015 5:00: Invasive ductal carcinoma grade 2, perineural invasion present, ER 90%, PR 70%, HER-2 negative ratio 0.95, Ki-67 15%     Breast MRI 03/07/2015: Subareolar mass 2.2 x 2.4 x 2.5 cm extending into the retracted nipple, extending inferiorly and laterally over a distance of 4 cm, several level I right axillary lymph nodes with cortical thickening up to 7 mm with a maximum diameter of 15 mm   11/16/2018:Right mastectomy Ninfa Linden) performed because the tumor was fungating: IDC, grade 1, 5.2cm, grossly involving underlying skin, clear margins. Radiation to chest wall: 12/19/18- 01/30/19 Bone scan 06/26/2019: Stable distribution of bone metastases. CT CAP 04/28/2020: Mixed lytic and sclerotic bone metastases several demonstrating increased lytic character concern for worsening bone mets. 05/16/2019:[facility] molecular testing: ER positive, PIK3CA mutation present, HER-2 negative, ER positive TMB low BRCA1 not detected, ESR 1 not detected, PD-L1 negative --------------------------------------------------------------------- Treatment summary: Anastrozole with Xgeva started April 2020 Ibrance added 09/29/2018, Faslodex 11/20/2020 discontinued January 2023 (due to progression and toxicity)  Current treatment: Verzinio with letrozole along with Xgeva started 04/11/2021  New rib fractures: I placed a radiation oncology referral to see if there is any treatment option that could ameliorate her symptoms.  I  discussed that the xgeva will continue to help with taking calcium from the blood and placing it into the bone to help strengthen the bones to prevent further pathologic fractures.     All questions were answered. The patient knows to call the clinic with any problems, questions or concerns. We can certainly see the  patient much sooner if necessary.  Total encounter time:20 minutes*in face-to-face visit time, chart review, lab review, care coordination, order entry, and documentation of the encounter time.  Wilber Bihari, NP 07/29/21 10:33 PM Medical Oncology and Hematology [facility] [place], [place] [address] Tel. [phone]    Fax. [phone]  *Total Encounter Time as defined by the Centers for Medicare and Medicaid Services includes, in addition to the face-to-face time of a patient visit (documented in the note above) non-face-to-face time: obtaining and reviewing outside history, ordering and reviewing medications, tests or procedures, care coordination (communications with other health care professionals or caregivers) and documentation in the medical record.

## 2021-07-23 ENCOUNTER — Telehealth: Payer: Self-pay | Admitting: Adult Health

## 2021-07-23 NOTE — Telephone Encounter (Signed)
Scheduled appointment per 5/31 los. Left message.

## 2021-07-29 ENCOUNTER — Encounter: Payer: Self-pay | Admitting: Hematology and Oncology

## 2021-07-29 NOTE — Assessment & Plan Note (Signed)
Right breast irregular mass lower inner quadrant retroareolar 02/19/2015: 2.2 x 1.3 x 1.7 cm with architectural distortion and skin/nipple retraction, T2 N0 stage II a clinical stage, additional benign cysts largest 1.2 cm Right breast biopsy 02/20/2015 5:00: Invasive ductal carcinoma grade 2, perineural invasion present, ER 90%, PR 70%, HER-2 negative ratio 0.95, Ki-67 15%   Breast MRI 03/07/2015: Subareolar mass 2.2 x 2.4 x 2.5 cm extending into the retracted nipple, extending inferiorly and laterally over a distance of 4 cm, several level I right axillary lymph nodes with cortical thickening up to 7 mm with a maximum diameter of 15 mm  11/16/2018:Right mastectomy ([facility])performed because the tumor was fungating: IDC, grade 1, 5.2cm, grossly involving underlying skin, clear margins. Radiation to chest wall: 12/19/18- 01/30/19 Bone scan 06/26/2019: Stable distribution of bone metastases. CT CAP3/08/2020:Mixed lytic and sclerotic bone metastases several demonstrating increased lytic character concern for worsening bone mets. 05/16/2019:Caris molecular testing: ER positive, PIK3CA mutation present, HER-2 negative, ER positive TMB low BRCA1 not detected, ESR 1 not detected, PD-L1 negative --------------------------------------------------------------------- Treatment summary:Anastrozole with Delton See started April 2020Ibrance added 09/29/2018, Faslodex 11/20/2020 discontinued January 2023 (due to progression and toxicity)  Current treatment: Verzinio with letrozole along with Xgeva started 04/11/2021  New rib fractures: I placed a radiation oncology referral to see if there is any treatment option that could ameliorate her symptoms.  I discussed that the xgeva will continue to help with taking calcium from the blood and placing it into the bone to help strengthen the bones to prevent further pathologic fractures.

## 2021-07-30 ENCOUNTER — Ambulatory Visit: Payer: Medicare Other | Admitting: Family Medicine

## 2021-07-31 ENCOUNTER — Other Ambulatory Visit: Payer: Self-pay | Admitting: Internal Medicine

## 2021-07-31 DIAGNOSIS — E119 Type 2 diabetes mellitus without complications: Secondary | ICD-10-CM

## 2021-08-05 ENCOUNTER — Other Ambulatory Visit (HOSPITAL_COMMUNITY): Payer: Self-pay

## 2021-08-06 ENCOUNTER — Other Ambulatory Visit (HOSPITAL_COMMUNITY): Payer: Self-pay

## 2021-08-06 ENCOUNTER — Telehealth: Payer: Self-pay | Admitting: *Deleted

## 2021-08-06 NOTE — Telephone Encounter (Signed)
Received VM from pt.  RN attempt x1 to return call.  No answer, unable to LVM due to phone continuously ringing.

## 2021-08-07 NOTE — Progress Notes (Incomplete)
Histology and Location of Primary Cancer:  Malignant neoplasm of lower-inner quadrant of right breast of female, estrogen receptor positive  Sites of Visceral and Bony Metastatic Disease:  Right Rib and Chest (3-View) 07/17/2021 --FINDINGS: Multiple permeative appearing right rib lesions involving the posterior 6th 7th and 12th ribs and lateral 4th rib. Probable nondisplaced pathologic fracture of the posterior seventh right rib. Additional permeative appearing lesions are seen in the anterior left 2nd rib, inferior left scapula and right humeral head. No fracture or other bone lesions are seen involving the ribs.  There is no evidence of pneumothorax or pleural effusion. Background emphysema with no focal consolidation. No pleural effusion or pneumothorax. Heart size and mediastinal contours are within normal limits. --IMPRESSION: Findings compatible with osseous metastatic disease. Probable nondisplaced pathologic fracture of the posterior seventh right rib.  Past/Anticipated chemotherapy by medical oncology, if any:  Under care of Dr. [name] 07/22/2021 Mendel Ryder Causey-NP office note) --Treatment summary:  Anastrozole with Delton See started April 2020  Ibrance added 09/29/2018,  Faslodex 11/20/2020 discontinued January 2023 (due to progression and toxicity) --Current treatment: Verzinio with letrozole along with Xgeva started 04/11/2021 --New rib fractures:  I placed a radiation oncology referral to see if there is any treatment option that could ameliorate her symptoms.   I discussed that the xgeva will continue to help with taking calcium from the blood and placing it into the bone to help strengthen the bones to prevent further pathologic fractures.     Pain on a scale of 0-10 is: Reports right flank/rib is tender to the touch, but states that pain from "pop" feeling has resolved. Denies any impairment in movement or activity level  Ambulatory status? Walker? Wheelchair?: Able to  ambulate without assistance  SAFETY ISSUES: Prior radiation? Yes: 11/19/2020 through 12/02/2020 Site Technique Total Dose (Gy) Dose per Fx (Gy) Completed Fx Beam Energies  Thoracic Spine: Spine_ T8-T11 3D 30/30 3 10/10 10X, 15X  Femur Left: Ext_Lt_femur Complex 30/30 3 10/10 10X  Lumbar Spine: Spine_L4-S4 3D 30/30 3 10/10 10X, 15X   12/18/2018 through 01/30/2019 Site Technique Total Dose (Gy) Dose per Fx (Gy) Completed Fx Beam Energies  Chest Wall, Right: CW_Rt 3D 50/50 2 25/25 10X, 6X  Chest Wall, Right: CW_Rt_SCV_PAB 3D 50/50 2 25/25 6X, 10X  Chest Wall, Right: CW_Rt_Bst Electron 10/10 2 5/5 6E   Pacemaker/ICD? No Possible current pregnancy? No--hysterectomy  Is the patient on methotrexate? No  Current Complaints / other details:  Reports occasional and mild shortness of breath, but otherwise denies anything else of note

## 2021-08-10 ENCOUNTER — Other Ambulatory Visit: Payer: Self-pay | Admitting: Hematology and Oncology

## 2021-08-10 NOTE — Progress Notes (Signed)
Radiation Oncology         [phone] ________________________________  Outpatient Re-Consultation  Name: Emily Wilson MRN: [id number]  Date: 08/11/2021  DOB: [birth year]  CC:Emily Mai, MD  Emily Lose, MD   REFERRING PHYSICIAN: [name], MD  DIAGNOSIS: No diagnosis found.  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, spine, ribs    Cancer Staging  Malignant neoplasm of lower-inner quadrant of right breast of female, estrogen receptor positive ([place]) Staging form: Breast, AJCC 7th Edition - Pathologic stage from 12/07/2018: Stage IV (yT4b, NX, M1) - Signed by Eppie Gibson, MD on 12/07/2018 Stage prefix: Post-therapy Laterality: Right Tumor grade (Scarff-Bloom-Richardson system): G1 Estrogen receptor status: Positive Progesterone receptor status: Negative HER2 status: Negative   CHIEF COMPLAINT: Here to discuss management of a painful recent rib fracture / bone metastases from breast cancer primary  HISTORY OF PRESENT ILLNESS::Emily Wilson is a 56 y.o. female who presents today for consideration of further radiation therapy in management of a recent painful rib fracture. I last met with the patient on 01/07/21 for a 1 month post-RT follow-up. Since her last visit, the patient continued on with antiestrogen therapy consisting of anastrozole (started in April of 2020) and Ibrance (added on 09/29/2018) under the care of Dr. Lindi Wilson. The patient also continued taking Faslodex (started on 11/20/20) until it was discontinued this past January due to progression and toxicity (detailed below).    CT of the chest abdomen and pelvis performed on 03/11/2021 showed mild progression of widespread osseous metastatic disease and a stable 3 mm subpleural nodule in the left upper lobe. CT otherwise showed no new masses or lymphadenopathy in the chest, abdomen or pelvis.  During a follow up visit with Dr.  Lindi Wilson on 03/13/21, the patient  endorsed significantly worse allergic symptoms with faslodex. Subsequently, the patient opted to stop taking faslodex that same date. In regards to mild progression noted on her most recent CT, the patient opted to proceed with Verzenio along with letrozole and Xgeva (started on 04/11/21).  On 04/30/21, the patient presented to the ED for evaluation of nausea, vomiting, diarrhea and headache x 6 days. The patient noted recent improvement in most of her symptoms other than persistent nausea and diarrhea, along with mild vertigo. She also apparently attributed her symptoms to [name] which she subsequently stopped taking (Iater resumed). Following evaluation, her symptoms were presumed to be likely viral in etiology, and she was discharged with instructions to follow up with her PCP. Lab results later came back showing findings consistent with a GI virus.   The patient also met with her gastroenterologist, Dr. Loletha Wilson, on 06/09/21 for evaluation of constipation, diarrhea, GERD, and dysphagia. She in scheduled to undergo an endoscopy and colonoscopy in the near future for further evaluation of this. She was also prescribed Plenvu.   CT of the chest abdomen and pelvis performed on 06/16/2021 showed stable bone metastases involving the pelvis, spine, and ribs.  CT also showed interstitial thickening and bronchiectasis involving the right lower lobe.  During a follow-up with Dr. Lindi Wilson on 06/22/21, the patient endorsed cough and congestion. She also reported ongoing issues with dysphagia, and a recent episode of involuntary head shaking.  For her symptoms of cough and congestion, Dr. Lindi Wilson prescribed her azithromycin.   On 07/17/21, the patient presented to the ED for evaluation of persistent rib pain following a sneeze the prior evening, with associated inspiratory pain and pain with movement. Chest x-ray showed  a probable nondisplaced pathologic fracture of seventh rib as well as known osseous metastatic  disease (consisting of lesions involving the posterior 6th 7th and 12th ribs and lateral 4th rib, with additional permeative appearing lesions seen in the anterior left 2nd rib, inferior left scapula and right humeral head).  She was given a prescription for Percocet and advised to follow-up with her PCP.   During her most recent follow up with Emily Bihari NP on 07/22/21, the patient reported increased pain associated with her recent rib fracture and osseous metastatic disease. Subsequently, the patient was referred to me here today for consideration of radiation to this area to manage her pain.   PREVIOUS RADIATION THERAPY: Yes   Intent: Palliative  Radiation Treatment Dates: 11/19/2020 through 12/02/2020 Site Technique Total Dose (Gy) Dose per Fx (Gy) Completed Fx Beam Energies  Thoracic Spine: Spine_ T8-T11 3D 30/30 3 10/10 10X, 15X  Femur Left: Ext_Lt_femur Complex 30/30 3 10/10 10X  Lumbar Spine: Spine_L4-S4 3D 30/30 3 10/10 10X, 15X   Intent: Palliative / local control  Radiation Treatment Dates: 12/18/2018 through 01/30/2019 Site Technique Total Dose (Gy) Dose per Fx (Gy) Completed Fx Beam Energies  Chest Wall, Right: CW_Rt 3D 50/50 2 25/25 10X, 6X  Chest Wall, Right: CW_Rt_SCV_PAB 3D 50/50 2 25/25 6X, 10X  Chest Wall, Right: CW_Rt_Bst Electron 10/10 2 5/5 6E    PAST MEDICAL HISTORY:  has a past medical history of Anxiety state, unspecified, Arthritis, Breast cancer of lower-inner quadrant of right female breast ([place]), Carpal tunnel syndrome, Complication of anesthesia, Diabetes mellitus without complication ([place]), Diverticulosis, Heart murmur, History of radiation therapy (12/18/18- 01/30/19), Hyperlipidemia, Hyperthyroidism, Mitral valve prolapse, Pneumonia, Smoker, Tachycardia, and 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    PAST SURGICAL HISTORY: Past Surgical History:  Procedure Laterality Date   ABDOMINAL  HYSTERECTOMY     ANTERIOR CRUCIATE LIGAMENT REPAIR Right 2007   ACL repair   APPENDECTOMY     CARPAL TUNNEL RELEASE Right    GANGLION CYST EXCISION Right    TOTAL MASTECTOMY Right 11/16/2018   Procedure: RIGHT MASTECTOMY;  Surgeon: Coralie Keens, MD;  Location: [facility];  Service: General;  Laterality: Right;   TUBAL LIGATION      FAMILY HISTORY: family history includes Bladder Cancer (age of onset: 66) in her mother; Breast cancer in her paternal aunt; COPD in her maternal grandmother and maternal uncle; Cancer (age of onset: 23) in her father; Dementia in her maternal uncle; Diabetes in her maternal grandmother, mother, paternal grandmother, sister, and sister; Emphysema in her maternal grandmother; Heart attack in her brother; Kidney failure in her mother; Multiple sclerosis in her mother and paternal aunt; Other (age of onset: 51) in her mother; Stroke in her maternal grandfather.  SOCIAL HISTORY:  reports that she quit smoking about 9 years ago. Her smoking use included cigarettes. She has a 90.00 pack-year smoking history. She has never used smokeless tobacco. She reports that she does not drink alcohol and does not use drugs.  ALLERGIES: Doxycycline, Naproxen, Oxycodone-acetaminophen, Codeine, Hydrocodone-acetaminophen, Ibuprofen, Lantus [insulin glargine], Meloxicam, Propoxyphene n-acetaminophen, Sulfa antibiotics, Sulfonamide derivatives, and Tramadol  MEDICATIONS:  Current Outpatient Medications  Medication Sig Dispense Refill   abemaciclib (VERZENIO) 50 MG tablet Take 1 tablet (50 mg total) by mouth 2 (two) times daily. Swallow tablets whole. Do not chew, crush, or split tablets before swallowing. 56 tablet 3   Accu-Chek FastClix Lancets MISC USE TO TEST BLOOD SUGAR UP TO  FOUR TIMES DAILY AS DIRECTED 204 each 2   BD INSULIN SYRINGE U/F 31G X 5/16" 0.5 ML MISC USE TO ADMINISTER INSULIN 3 TIMES DAILY WITH MEALS 100 each 3   bismuth subsalicylate (PEPTO BISMOL) 262 MG/15ML suspension  Take 30 mLs by mouth every 6 (six) hours as needed.     denosumab (XGEVA) 120 MG/1.7ML SOLN injection Inject 120 mg into the skin every 3 (three) months.     diclofenac (VOLTAREN) 75 MG EC tablet TAKE 1 TABLET BY MOUTH 2 TIMES DAILY AS NEEDED. 60 tablet 2   dicyclomine (BENTYL) 20 MG tablet Take 1 tablet (20 mg total) by mouth every 6 (six) hours. 120 tablet 0   gabapentin (NEURONTIN) 100 MG capsule Take 3 capsules (300 mg total) by mouth 3 (three) times daily. 90 capsule 1   glucose blood (ACCU-CHEK GUIDE) test strip USE TO CHECK BLOOD SUGAR UP TO 4 TIMES A DAY (E11.65) 200 strip 2   HYDROmorphone (DILAUDID) 4 MG tablet Take 1 tablet (4 mg total) by mouth every 4 (four) hours as needed for severe pain. 10 tablet 0   insulin NPH Human (HUMULIN N) 100 UNIT/ML injection INJECT 12 UNITS INTO THE SKIN AT BEDTIME 10 mL 1   insulin regular (NOVOLIN R) 100 units/mL injection Inject 0.12 mLs (12 Units total) into the skin 3 (three) times daily before meals. E.11.9. Hold dose for blood sugar less than 125 10 mL 11   letrozole (FEMARA) 2.5 MG tablet TAKE 1 TABLET BY MOUTH EVERY DAY 90 tablet 3   loperamide (IMODIUM) 2 MG capsule Take 2 mg by mouth as needed for diarrhea or loose stools. Take 2 tabs (4 mg) with first loose stool, then 1 tab (2 mg) with each additional loose stool. Do not take more than 8 tabs (16 mg) in a 24-hour period.     meclizine (ANTIVERT) 25 MG tablet Take 1 tablet (25 mg total) by mouth 3 (three) times daily as needed for dizziness. 30 tablet 0   metoprolol succinate (TOPROL-XL) 25 MG 24 hr tablet Take 1 tablet (25 mg total) by mouth daily. 90 tablet 3   Milk Thistle 1000 MG CAPS Take 1,000 mg by mouth daily as needed.     ondansetron (ZOFRAN ODT) 8 MG disintegrating tablet Take 1 tablet (8 mg total) by mouth every 8 (eight) hours as needed for nausea or vomiting. 20 tablet 1   PEG-KCl-NaCl-NaSulf-Na Asc-C (PLENVU) 140 g SOLR Take 1 kit by mouth as directed. Use coupon: BIN: [id number] PNC:  CNRX Group: [id number] ID: [id number] 1 each 0   rosuvastatin (CRESTOR) 40 MG tablet TAKE 1 TABLET BY MOUTH EVERY DAY 90 tablet 0   No current facility-administered medications for this encounter.    REVIEW OF SYSTEMS:  Notable for that above.   PHYSICAL EXAM:  vitals were not taken for this visit.   General: Alert and oriented, in no acute distress *** HEENT: Head is normocephalic. Extraocular movements are intact. Oropharynx is clear. Neck: Neck is supple, no palpable cervical or supraclavicular lymphadenopathy. Heart: Regular in rate and rhythm with no murmurs, rubs, or gallops. Chest: Clear to auscultation bilaterally, with no rhonchi, wheezes, or rales. Abdomen: Soft, nontender, nondistended, with no rigidity or guarding. Extremities: No cyanosis or edema. Lymphatics: see Neck Exam Skin: No concerning lesions. Musculoskeletal: symmetric strength and muscle tone throughout. Neurologic: Cranial nerves II through XII are grossly intact. No obvious focalities. Speech is fluent. Coordination is intact. Psychiatric: Judgment and insight are intact. Affect is  appropriate.   ECOG = ***  0 - Asymptomatic (Fully active, able to carry on all predisease activities without restriction)  1 - Symptomatic but completely ambulatory (Restricted in physically strenuous activity but ambulatory and able to carry out work of a light or sedentary nature. For example, light housework, office work)  2 - Symptomatic, <50% in bed during the day (Ambulatory and capable of all self care but unable to carry out any work activities. Up and about more than 50% of waking hours)  3 - Symptomatic, >50% in bed, but not bedbound (Capable of only limited self-care, confined to bed or chair 50% or more of waking hours)  4 - Bedbound (Completely disabled. Cannot carry on any self-care. Totally confined to bed or chair)  5 - Death   Eustace Pen MM, Creech RH, Tormey DC, et al. [phone]). "Toxicity and response criteria of  the [facility]". [place] Oncol. 5 (6): 649-55   LABORATORY DATA:  Lab Results  Component Value Date   WBC 4.3 07/14/2021   HGB 13.9 07/14/2021   HCT 41.0 07/14/2021   MCV 95.3 07/14/2021   PLT 342 07/14/2021   CMP     Component Value Date/Time   NA 139 07/14/2021 0932   NA 145 (H) 04/30/2021 1033   K 4.2 07/14/2021 0932   CL 107 07/14/2021 0932   CO2 27 07/14/2021 0932   GLUCOSE 86 07/14/2021 0932   BUN 18 07/14/2021 0932   BUN 8 04/30/2021 1033   CREATININE 0.76 07/14/2021 0932   CALCIUM 9.4 07/14/2021 0932   PROT 7.3 07/14/2021 0932   PROT 6.7 04/30/2021 1033   ALBUMIN 4.0 07/14/2021 0932   ALBUMIN 4.3 04/30/2021 1033   AST 17 07/14/2021 0932   ALT 17 07/14/2021 0932   ALKPHOS 107 07/14/2021 0932   BILITOT 0.4 07/14/2021 0932   GFRNONAA >60 07/14/2021 0932   GFRAA >60 11/02/2019 1231         RADIOGRAPHY: DG Ribs Unilateral W/Chest Right  Result Date: 07/17/2021 CLINICAL DATA:  Rib pain EXAM: RIGHT RIBS AND CHEST - 3 VIEW COMPARISON:  CT chest, abdomen and pelvis dated June 16, 2021 FINDINGS: Multiple permeative appearing right rib lesions involving the posterior 6th 7th and 12th ribs and lateral 4th rib. Probable nondisplaced pathologic fracture of the posterior seventh right rib. Additional permeative appearing lesions are seen in the anterior left 2nd rib, inferior left scapula and right humeral head. No fracture or other bone lesions are seen involving the ribs. There is no evidence of pneumothorax or pleural effusion. Background emphysema with no focal consolidation. No pleural effusion or pneumothorax. Heart size and mediastinal contours are within normal limits. IMPRESSION: Findings compatible with osseous metastatic disease. Probable nondisplaced pathologic fracture of the posterior seventh right rib. Electronically Signed   By: Yetta Glassman M.D.   On: 07/17/2021 15:06      IMPRESSION/PLAN:***    On date of service, in total, I  spent *** minutes on this encounter. Patient was seen in person.   __________________________________________   Eppie Gibson, MD  This document serves as a record of services personally performed by Eppie Gibson, MD. It was created on her behalf by Roney Mans, a trained medical scribe. The creation of this record is based on the scribe's personal observations and the provider's statements to them. This document has been checked and approved by the attending provider.

## 2021-08-11 ENCOUNTER — Other Ambulatory Visit: Payer: Self-pay

## 2021-08-11 ENCOUNTER — Encounter: Payer: Self-pay | Admitting: Radiation Oncology

## 2021-08-11 ENCOUNTER — Ambulatory Visit
Admission: RE | Admit: 2021-08-11 | Discharge: 2021-08-11 | Disposition: A | Discharge status: Home / Self Care | Payer: Medicare Other | Source: Ambulatory Visit | Attending: Radiation Oncology | Admitting: Radiation Oncology

## 2021-08-11 VITALS — BP 111/78 | HR 99 | Temp 98.8°F | Resp 19 | Wt 157.6 lb

## 2021-08-11 DIAGNOSIS — Z794 Long term (current) use of insulin: Secondary | ICD-10-CM | POA: Diagnosis not present

## 2021-08-11 DIAGNOSIS — C7951 Secondary malignant neoplasm of bone: Secondary | ICD-10-CM

## 2021-08-11 DIAGNOSIS — Z803 Family history of malignant neoplasm of breast: Secondary | ICD-10-CM | POA: Diagnosis not present

## 2021-08-11 DIAGNOSIS — G893 Neoplasm related pain (acute) (chronic): Secondary | ICD-10-CM | POA: Insufficient documentation

## 2021-08-11 DIAGNOSIS — E119 Type 2 diabetes mellitus without complications: Secondary | ICD-10-CM | POA: Insufficient documentation

## 2021-08-11 DIAGNOSIS — E785 Hyperlipidemia, unspecified: Secondary | ICD-10-CM | POA: Insufficient documentation

## 2021-08-11 DIAGNOSIS — Z8052 Family history of malignant neoplasm of bladder: Secondary | ICD-10-CM | POA: Insufficient documentation

## 2021-08-11 DIAGNOSIS — Z79899 Other long term (current) drug therapy: Secondary | ICD-10-CM | POA: Insufficient documentation

## 2021-08-11 DIAGNOSIS — C50311 Malignant neoplasm of lower-inner quadrant of right female breast: Secondary | ICD-10-CM | POA: Diagnosis not present

## 2021-08-11 DIAGNOSIS — I341 Nonrheumatic mitral (valve) prolapse: Secondary | ICD-10-CM | POA: Diagnosis not present

## 2021-08-11 DIAGNOSIS — Z809 Family history of malignant neoplasm, unspecified: Secondary | ICD-10-CM | POA: Insufficient documentation

## 2021-08-11 DIAGNOSIS — R911 Solitary pulmonary nodule: Secondary | ICD-10-CM | POA: Insufficient documentation

## 2021-08-11 DIAGNOSIS — Z87891 Personal history of nicotine dependence: Secondary | ICD-10-CM | POA: Insufficient documentation

## 2021-08-11 DIAGNOSIS — Z170 Estrogen receptor positive status [ER+]: Secondary | ICD-10-CM | POA: Insufficient documentation

## 2021-08-12 ENCOUNTER — Other Ambulatory Visit (HOSPITAL_COMMUNITY): Payer: Self-pay

## 2021-08-20 ENCOUNTER — Ambulatory Visit: Payer: Medicare Other | Admitting: Adult Health

## 2021-09-01 ENCOUNTER — Inpatient Hospital Stay: Payer: Medicare Other | Attending: Hematology and Oncology | Admitting: Adult Health

## 2021-09-01 ENCOUNTER — Telehealth: Payer: Self-pay

## 2021-09-01 ENCOUNTER — Encounter: Payer: Self-pay | Admitting: Adult Health

## 2021-09-01 ENCOUNTER — Other Ambulatory Visit: Payer: Self-pay

## 2021-09-01 VITALS — BP 130/83 | HR 83 | Temp 97.8°F | Resp 18 | Ht <= 58 in | Wt 157.5 lb

## 2021-09-01 DIAGNOSIS — Z9011 Acquired absence of right breast and nipple: Secondary | ICD-10-CM | POA: Diagnosis not present

## 2021-09-01 DIAGNOSIS — Z170 Estrogen receptor positive status [ER+]: Secondary | ICD-10-CM | POA: Insufficient documentation

## 2021-09-01 DIAGNOSIS — C50311 Malignant neoplasm of lower-inner quadrant of right female breast: Secondary | ICD-10-CM | POA: Insufficient documentation

## 2021-09-01 DIAGNOSIS — Z87891 Personal history of nicotine dependence: Secondary | ICD-10-CM | POA: Diagnosis not present

## 2021-09-01 DIAGNOSIS — Z79899 Other long term (current) drug therapy: Secondary | ICD-10-CM | POA: Insufficient documentation

## 2021-09-01 DIAGNOSIS — Z79811 Long term (current) use of aromatase inhibitors: Secondary | ICD-10-CM | POA: Diagnosis not present

## 2021-09-01 DIAGNOSIS — Z923 Personal history of irradiation: Secondary | ICD-10-CM | POA: Insufficient documentation

## 2021-09-01 DIAGNOSIS — K5730 Diverticulosis of large intestine without perforation or abscess without bleeding: Secondary | ICD-10-CM | POA: Insufficient documentation

## 2021-09-01 DIAGNOSIS — C7951 Secondary malignant neoplasm of bone: Secondary | ICD-10-CM | POA: Diagnosis present

## 2021-09-01 DIAGNOSIS — K5901 Slow transit constipation: Secondary | ICD-10-CM | POA: Insufficient documentation

## 2021-09-01 NOTE — Progress Notes (Signed)
[name], [place] [address]   DIAGNOSIS:  Cancer Staging  Malignant neoplasm of lower-inner quadrant of right breast of female, estrogen receptor positive ([place]) Staging form: Breast, AJCC 7th Edition - Pathologic stage from 12/07/2018: Stage IV (yT4b, NX, M1) - Signed by Emily Gibson, MD on 12/07/2018 Stage prefix: Post-therapy Laterality: Right Tumor grade (Scarff-Bloom-Richardson system): G1 Estrogen receptor status: Positive Progesterone receptor status: Negative HER2 status: Negative   SUMMARY OF ONCOLOGIC HISTORY: Oncology History  Malignant neoplasm of lower-inner quadrant of right breast of female, estrogen receptor positive ([place])  02/19/2015 Mammogram   Right breast irregular mass lower inner quadrant 2.2 x 1.3 x 1.7 cm with architectural distortion and skin/nipple retraction, T2 N0 stage II a clinical stage, additional benign cysts largest 1.2 cm   02/20/2015 Initial Diagnosis   Right breast biopsy 5:00: Invasive ductal carcinoma grade 2, perineural invasion present, ER 90%, PR 70%, HER-2 negative ratio 0.95, Ki-67 15%    03/07/2015 Breast MRI   right breast inferior subareolar mass measuring 2.2 x 2.4 x 2.5 cm with skin and nipple enhancement, extending inferiorly and laterally is intraductal enhancement over a distance of 4 cm, several level I right axillary lymph nodes with cortical thickening   03/13/2015 -  Anti-estrogen oral therapy   Neoadjuvant antiestrogen therapy with tamoxifen 20 mg daily (because the patient did not want to undergo surgery immediately for multiple family reasons) stopped in late 2017; anastrozole started 05/11/18   05/23/2018 Imaging   CT CAP: 2.9 cm spiculated soft tissue density central right breast, no evidence of soft tissue metastatic disease within the chest abdomen pelvis.  Subcentimeter sclerotic bone lesions T10, left pubis, right posterior ilium Bone  scan: Foci of uptake left frontal calvarium, T-spine, right ilium   10/04/2018 - 02/2021 Anti-estrogen oral therapy    Anastrozole with Delton See started April 2020 Ibrance added 09/29/2018, Faslodex 11/20/2020 discontinued January 2023 (due to progression and toxicity)   11/16/2018 Surgery   Right mastectomy Ninfa Wilson): IDC, grade 1, 5.2cm, grossly involving underlying skin, clear margins.   12/07/2018 Cancer Staging   Staging form: Breast, AJCC 7th Edition - Pathologic stage from 12/07/2018: Stage IV (yT4b, NX, M1) - Signed by Emily Gibson, MD on 12/07/2018   12/18/2018 - 01/30/2019 Radiation Therapy   12/18/2018 through 01/30/2019 Site Technique Total Dose (Gy) Dose per Fx (Gy) Completed Fx Beam Energies  Chest Wall, Right: CW_Rt 3D 50/50 2 25/25 10X, 6X  Chest Wall, Right: CW_Rt_SCV_PAB 3D 50/50 2 25/25 6X, 10X  Chest Wall, Right: CW_Rt_Bst Electron 10/10 2 5/5 6E    05/16/2019 Miscellaneous   [facility] molecular testing: ER positive, PI K3 CA mutation present, HER-2 negative, ER positive TMB low BRCA1 not detected, ESR 1 not detected, PD-L1 negative   11/19/2020 - 12/02/2020 Radiation Therapy   11/19/2020 through 12/02/2020 Site Technique Total Dose (Gy) Dose per Fx (Gy) Completed Fx Beam Energies  Thoracic Spine: Spine_ T8-T11 3D 30/30 3 10/10 10X, 15X  Femur Left: Ext_Lt_femur Complex 30/30 3 10/10 10X  Lumbar Spine: Spine_L4-S4 3D 30/30 3 10/10 10X, 15X    04/11/2021 Treatment Plan Change   Verzinio with letrozole along with Xgeva started 04/11/2021   06/16/2021 Imaging   CT chest/abd/pelivs w/contrast  IMPRESSION: 1. No evidence of breast cancer carcinoma progression. 2. New linear interstitial thickening and bronchiectasis in the medial RIGHT lower lobe. Query radiation treatment or aspiration. 3. Multifocal skeletal metastasis involving the pelvis,  spine, ribs and shoulder girdles. No interval change.     CURRENT THERAPY: Letrozole, Verzenio, Delton See  INTERVAL HISTORY: Emily Wilson 56 y.o. female returns for follow up of her right chest wall pain she reported about 6 weeks ago that noted rib fractures.  She was having some discomfort, however that has resolved.  She saw Dr. Isidore Wilson who did not give her radiation since her pain had resolved.  Emily Wilson continues on letrozole and Verzenio with good tolerance.  She does have some right knee pain that bothers her from time to time.  This is a site of her previous ACL injury as well.  She does take diclofenac which helps with her aches and pains.  She has no new concerns today.   Patient Active Problem List   Diagnosis Date Noted   Diverticulosis of colon 09/01/2021   Slow transit constipation 09/01/2021   History of shingles 01/09/2019   S/P mastectomy, right 11/16/2018   Malignant neoplasm metastatic to bone ([place]) 05/31/2018   Family history of breast cancer in female 03/13/2015   Malignant neoplasm of lower-inner quadrant of right breast of female, estrogen receptor positive ([place]) 02/25/2015   DM (diabetes mellitus), type 2 ([place]) 02/25/2015   HLD (hyperlipidemia) 02/25/2015   Palpitations 01/26/2010   Anxiety state 05/26/2009   Hyperthyroidism 11/14/2006   Hypertension 11/14/2006    is allergic to doxycycline, naproxen, oxycodone-acetaminophen, codeine, hydrocodone-acetaminophen, ibuprofen, lantus [insulin glargine], meloxicam, propoxyphene n-acetaminophen, sulfa antibiotics, sulfonamide derivatives, and tramadol.  MEDICAL HISTORY: Past Medical History:  Diagnosis Date   Anxiety state, unspecified    Arthritis    Breast cancer of lower-inner quadrant of right female breast [facility])    Carpal tunnel syndrome    Complication of anesthesia    woke up during ganglion cyst removal in her 20's, not woken up since   Diabetes mellitus without complication (HCC)    Type II   Diverticulosis    Heart murmur    History of radiation therapy 12/18/18- 01/30/19   Right Chest wall 25 fractions X 2Gy each to total 50 Gy,  followed by a boost 10 Gy in 5 fractions.    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, without mention of thyrotoxic crisis or storm     SURGICAL HISTORY: Past Surgical History:  Procedure Laterality Date   ABDOMINAL HYSTERECTOMY     ANTERIOR CRUCIATE LIGAMENT REPAIR Right 2007   ACL repair   APPENDECTOMY     CARPAL TUNNEL RELEASE Right    GANGLION CYST EXCISION Right    TOTAL MASTECTOMY Right 11/16/2018   Procedure: RIGHT MASTECTOMY;  Surgeon: Coralie Keens, MD;  Location: [facility];  Service: General;  Laterality: Right;   TUBAL LIGATION      SOCIAL HISTORY: Social History   Socioeconomic History   Marital status: Divorced    Spouse name: Not on file   Number of children: 2   Years of education: Not on file   Highest education level: Not on file  Occupational History   Occupation: Disabled  Tobacco Use   Smoking status: Former    Packs/day: 3.00    Years: 30.00    Total pack years: 90.00    Types: Cigarettes    Quit date: 10/24/2011    Years since quitting: 9.8   Smokeless tobacco: Never  Vaping Use   Vaping  Use: Never used  Substance and Sexual Activity   Alcohol use: No   Drug use: Never   Sexual activity: Not Currently  Other Topics Concern   Not on file  Social History Narrative   In LTR w/boyfriend   Works Chiropractor, Scientist, water quality   Social Determinants of Health   Financial Resource Strain: Medium Risk (05/01/2021)   Overall Financial Resource Strain (CARDIA)    Difficulty of Paying Living Expenses: Somewhat hard  Food Insecurity: No Food Insecurity (05/01/2021)   Hunger Vital Sign    Worried About Running Out of Food in the Last Year: Never true    [place] in the Last Year: Never true  Transportation Needs: No Transportation Needs (05/01/2021)   PRAPARE - Hydrologist (Medical): No    Lack of  Transportation (Non-Medical): No  Physical Activity: Inactive (05/01/2021)   Exercise Vital Sign    Days of Exercise per Week: 0 days    Minutes of Exercise per Session: 0 min  Stress: Stress Concern Present (05/01/2021)   [place]    Feeling of Stress : Very much  Social Connections: Socially Isolated (05/01/2021)   Social Connection and Isolation Panel [NHANES]    Frequency of Communication with Friends and Family: More than three times a week    Frequency of Social Gatherings with Friends and Family: More than three times a week    Attends Religious Services: Never    Marine scientist or Organizations: No    Attends Archivist Meetings: Never    Marital Status: Divorced  Human resources officer Violence: Not At Risk (05/01/2021)   Humiliation, Afraid, Rape, and Kick questionnaire    Fear of Current or Ex-Partner: No    Emotionally Abused: No    Physically Abused: No    Sexually Abused: No    FAMILY HISTORY: Family History  Problem Relation Age of Onset   Diabetes Mother    Bladder Cancer Mother 34       smoker   Other Mother 51       TAH for unspecified reason   Multiple sclerosis Mother    Kidney failure Mother    Cancer Father 47       lymphatic/tonsil cancer - in remission; former smoker   Diabetes Sister    Diabetes Sister    Heart attack Brother    COPD Maternal Grandmother        smoker   Emphysema Maternal Grandmother        smoker   Diabetes Maternal Grandmother    Stroke Maternal Grandfather    Diabetes Paternal Grandmother    Dementia Maternal Uncle    COPD Maternal Uncle        smoker   Multiple sclerosis Paternal Aunt    Breast cancer Paternal Aunt        dx. late 56s - early 40s    Review of Systems  Constitutional:  Negative for appetite change, chills, fatigue, fever and unexpected weight change.  HENT:   Negative for hearing loss, lump/mass and trouble swallowing.    Eyes:  Negative for eye problems and icterus.  Respiratory:  Negative for chest tightness, cough and shortness of breath.   Cardiovascular:  Negative for chest pain, leg swelling and palpitations.  Gastrointestinal:  Negative for abdominal distention, abdominal pain, constipation, diarrhea, nausea and vomiting.  Endocrine: Negative for hot flashes.  Genitourinary:  Negative for difficulty urinating.  Musculoskeletal:  Positive for arthralgias (worse in right knee).  Skin:  Negative for itching and rash.  Neurological:  Negative for dizziness, extremity weakness, headaches and numbness.  Hematological:  Negative for adenopathy. Does not bruise/bleed easily.  Psychiatric/Behavioral:  Negative for depression. The patient is not nervous/anxious.       PHYSICAL EXAMINATION  ECOG PERFORMANCE STATUS: 1 - Symptomatic but completely ambulatory  Vitals:   09/01/21 0800  BP: 130/83  Pulse: 83  Resp: 18  Temp: 97.8 F (36.6 C)  SpO2: 98%    Physical Exam Constitutional:      General: She is not in acute distress.    Appearance: Normal appearance. She is not toxic-appearing.  HENT:     Head: Normocephalic and atraumatic.  Eyes:     General: No scleral icterus. Cardiovascular:     Rate and Rhythm: Normal rate and regular rhythm.     Pulses: Normal pulses.     Heart sounds: Normal heart sounds.  Pulmonary:     Effort: Pulmonary effort is normal.     Breath sounds: Normal breath sounds.  Abdominal:     General: Abdomen is flat. Bowel sounds are normal. There is no distension.     Palpations: Abdomen is soft.     Tenderness: There is no abdominal tenderness.  Musculoskeletal:        General: No swelling.     Cervical back: Neck supple.  Lymphadenopathy:     Cervical: No cervical adenopathy.  Skin:    General: Skin is warm and dry.     Findings: No rash.  Neurological:     General: No focal deficit present.     Mental Status: She is alert.  Psychiatric:        Mood and  Affect: Mood normal.        Behavior: Behavior normal.     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  She has been on letrozole, Verzenio since February 2023 and began [place] in August 2020.  She is here for follow-up of her right rib fractures and pain which is resolved.  She has no clinical sign of breast cancer progression.  She is due for follow-up with Dr. Lindi Adie in August with her Delton See which is already scheduled.  I ordered repeat CT chest abdomen and pelvis to assess her treatment response for her metastatic breast cancer to the letrozole, Verzenio, and Xgeva.   All questions were answered. The patient knows to call the clinic with any problems, questions or concerns. We can certainly see the patient much sooner if necessary. This note was electronically signed.  Total encounter time:20 minutes*in face-to-face visit time, chart review, lab review, care coordination, order entry, and documentation of the encounter time.    Wilber Bihari, NP 09/01/21 8:44 AM Medical Oncology and Hematology [facility] [place],  [address] Tel. [phone]    Fax. [phone]  *Total Encounter Time as defined by the Centers for Medicare and Medicaid Services includes, in addition to the face-to-face time of a patient visit (documented in the note above) non-face-to-face time: obtaining and reviewing outside history, ordering and reviewing medications, tests or procedures, care coordination (communications with other health care professionals or caregivers) and documentation in the medical record.

## 2021-09-01 NOTE — Telephone Encounter (Signed)
S/W pt per NP regarding ability to amend her chart via medical records. Pt verbalized thanks and has been in touch with medical records today. She knows to call with any further concerns.

## 2021-09-01 NOTE — Assessment & Plan Note (Addendum)
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  She has been on letrozole, Verzenio since February 2023 and began [place] in August 2020.  She is here for follow-up of her right rib fractures and pain which is resolved.  She has no clinical sign of breast cancer progression.  She is due for follow-up with Dr. Lindi Adie in August with her Delton See which is already scheduled.  I ordered repeat CT chest abdomen and pelvis to assess her treatment response for her metastatic breast cancer to the letrozole, Verzenio, and Xgeva.

## 2021-09-02 ENCOUNTER — Encounter: Payer: Self-pay | Admitting: Family Medicine

## 2021-09-02 ENCOUNTER — Ambulatory Visit (INDEPENDENT_AMBULATORY_CARE_PROVIDER_SITE_OTHER): Payer: Medicare Other | Admitting: Family Medicine

## 2021-09-02 ENCOUNTER — Other Ambulatory Visit (HOSPITAL_COMMUNITY): Payer: Self-pay

## 2021-09-02 VITALS — BP 111/62 | HR 123 | Temp 98.1°F | Resp 16 | Wt 156.2 lb

## 2021-09-02 DIAGNOSIS — I10 Essential (primary) hypertension: Secondary | ICD-10-CM | POA: Diagnosis not present

## 2021-09-02 DIAGNOSIS — E1159 Type 2 diabetes mellitus with other circulatory complications: Secondary | ICD-10-CM

## 2021-09-02 DIAGNOSIS — Z794 Long term (current) use of insulin: Secondary | ICD-10-CM | POA: Diagnosis not present

## 2021-09-02 DIAGNOSIS — E785 Hyperlipidemia, unspecified: Secondary | ICD-10-CM

## 2021-09-02 DIAGNOSIS — C7951 Secondary malignant neoplasm of bone: Secondary | ICD-10-CM | POA: Diagnosis not present

## 2021-09-02 LAB — POCT GLYCOSYLATED HEMOGLOBIN (HGB A1C): Hemoglobin A1C: 7.5 % — AB (ref 4.0–5.6)

## 2021-09-03 ENCOUNTER — Other Ambulatory Visit (HOSPITAL_COMMUNITY): Payer: Self-pay

## 2021-09-04 NOTE — Progress Notes (Signed)
Established Patient Office Visit  Subjective    Patient ID: Emily Wilson, female    DOB: May 06, 1965  Age: 56 y.o. MRN: [id number]  CC:  Chief Complaint  Patient presents with   Diabetes    HPI Emily Wilson presents for routine follow up of chronic med issues. She denies acute complaints or concerns.    Outpatient Encounter Medications as of 09/02/2021  Medication Sig   abemaciclib (VERZENIO) 50 MG tablet Take 1 tablet (50 mg total) by mouth 2 (two) times daily. Swallow tablets whole. Do not chew, crush, or split tablets before swallowing.   Accu-Chek FastClix Lancets MISC USE TO TEST BLOOD SUGAR UP TO FOUR TIMES DAILY AS DIRECTED   BD INSULIN SYRINGE U/F 31G X 5/16" 0.5 ML MISC USE TO ADMINISTER INSULIN 3 TIMES DAILY WITH MEALS   bismuth subsalicylate (PEPTO BISMOL) 262 MG/15ML suspension Take 30 mLs by mouth every 6 (six) hours as needed.   denosumab (XGEVA) 120 MG/1.7ML SOLN injection Inject 120 mg into the skin every 3 (three) months.   diclofenac (VOLTAREN) 75 MG EC tablet TAKE 1 TABLET BY MOUTH 2 TIMES DAILY AS NEEDED.   dicyclomine (BENTYL) 20 MG tablet Take 1 tablet (20 mg total) by mouth every 6 (six) hours.   gabapentin (NEURONTIN) 100 MG capsule Take 3 capsules (300 mg total) by mouth 3 (three) times daily.   glucose blood (ACCU-CHEK GUIDE) test strip USE TO CHECK BLOOD SUGAR UP TO 4 TIMES A DAY (E11.65)   HYDROmorphone (DILAUDID) 4 MG tablet Take 1 tablet (4 mg total) by mouth every 4 (four) hours as needed for severe pain.   insulin NPH Human (HUMULIN N) 100 UNIT/ML injection INJECT 12 UNITS INTO THE SKIN AT BEDTIME   insulin regular (NOVOLIN R) 100 units/mL injection Inject 0.12 mLs (12 Units total) into the skin 3 (three) times daily before meals. E.11.9. Hold dose for blood sugar less than 125   letrozole (FEMARA) 2.5 MG tablet TAKE 1 TABLET BY MOUTH EVERY DAY   loperamide (IMODIUM) 2 MG capsule Take 2 mg by mouth as needed for diarrhea or loose stools. Take 2  tabs (4 mg) with first loose stool, then 1 tab (2 mg) with each additional loose stool. Do not take more than 8 tabs (16 mg) in a 24-hour period.   meclizine (ANTIVERT) 25 MG tablet Take 1 tablet (25 mg total) by mouth 3 (three) times daily as needed for dizziness.   metoprolol succinate (TOPROL-XL) 25 MG 24 hr tablet Take 1 tablet (25 mg total) by mouth daily.   Milk Thistle 1000 MG CAPS Take 1,000 mg by mouth daily as needed.   ondansetron (ZOFRAN ODT) 8 MG disintegrating tablet Take 1 tablet (8 mg total) by mouth every 8 (eight) hours as needed for nausea or vomiting.   PEG-KCl-NaCl-NaSulf-Na Asc-C (PLENVU) 140 g SOLR Take 1 kit by mouth as directed. Use coupon: BIN: [id number] PNC: CNRX Group: [id number] ID: [id number]   rosuvastatin (CRESTOR) 40 MG tablet TAKE 1 TABLET BY MOUTH EVERY DAY   No facility-administered encounter medications on file as of 09/02/2021.    Past Medical History:  Diagnosis Date   Anxiety state, unspecified    Arthritis    Breast cancer of lower-inner quadrant of right female breast [facility])    Carpal tunnel syndrome    Complication of anesthesia    woke up during ganglion cyst removal in her 20's, not woken up since   Diabetes mellitus without complication ([place])  Type II   Diverticulosis    Heart murmur    History of radiation therapy 12/18/18- 01/30/19   Right Chest wall 25 fractions X 2Gy each to total 50 Gy, followed by a boost 10 Gy in 5 fractions.    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, without mention of thyrotoxic crisis or storm     Past Surgical History:  Procedure Laterality Date   ABDOMINAL HYSTERECTOMY     ANTERIOR CRUCIATE LIGAMENT REPAIR Right 2007   ACL repair   APPENDECTOMY     CARPAL TUNNEL RELEASE Right    GANGLION CYST EXCISION Right    TOTAL MASTECTOMY Right 11/16/2018   Procedure: RIGHT MASTECTOMY;  Surgeon:  Coralie Keens, MD;  Location: [facility];  Service: General;  Laterality: Right;   TUBAL LIGATION      Family History  Problem Relation Age of Onset   Diabetes Mother    Bladder Cancer Mother 46       smoker   Other Mother 4       TAH for unspecified reason   Multiple sclerosis Mother    Kidney failure Mother    Cancer Father 26       lymphatic/tonsil cancer - in remission; former smoker   Diabetes Sister    Diabetes Sister    Heart attack Brother    COPD Maternal Grandmother        smoker   Emphysema Maternal Grandmother        smoker   Diabetes Maternal Grandmother    Stroke Maternal Grandfather    Diabetes Paternal Grandmother    Dementia Maternal Uncle    COPD Maternal Uncle        smoker   Multiple sclerosis Paternal Aunt    Breast cancer Paternal Aunt        dx. late 56s - early 73s    Social History   Socioeconomic History   Marital status: Divorced    Spouse name: Not on file   Number of children: 2   Years of education: Not on file   Highest education level: Not on file  Occupational History   Occupation: Disabled  Tobacco Use   Smoking status: Former    Packs/day: 3.00    Years: 30.00    Total pack years: 90.00    Types: Cigarettes    Quit date: 10/24/2011    Years since quitting: 9.8   Smokeless tobacco: Never  Vaping Use   Vaping Use: Never used  Substance and Sexual Activity   Alcohol use: No   Drug use: Never   Sexual activity: Not Currently  Other Topics Concern   Not on file  Social History Narrative   In [place] w/boyfriend   Works Chiropractor, Scientist, water quality   Social Determinants of Health   Financial Resource Strain: Medium Risk (05/01/2021)   Overall Financial Resource Strain (CARDIA)    Difficulty of Paying Living Expenses: Somewhat hard  Food Insecurity: No Food Insecurity (05/01/2021)   Hunger Vital Sign    Worried About Running Out of Food in the Last Year: Never true    Ran Out of Food in the Last Year: Never true  Transportation  Needs: No Transportation Needs (05/01/2021)   PRAPARE - Hydrologist (Medical): No    Lack of Transportation (Non-Medical): No  Physical  Activity: Inactive (05/01/2021)   Exercise Vital Sign    Days of Exercise per Week: 0 days    Minutes of Exercise per Session: 0 min  Stress: Stress Concern Present (05/01/2021)   [place]    Feeling of Stress : Very much  Social Connections: Socially Isolated (05/01/2021)   Social Connection and Isolation Panel [NHANES]    Frequency of Communication with Friends and Family: More than three times a week    Frequency of Social Gatherings with Friends and Family: More than three times a week    Attends Religious Services: Never    Marine scientist or Organizations: No    Attends Archivist Meetings: Never    Marital Status: Divorced  Human resources officer Violence: Not At Risk (05/01/2021)   Humiliation, Afraid, Rape, and Kick questionnaire    Fear of Current or Ex-Partner: No    Emotionally Abused: No    Physically Abused: No    Sexually Abused: No    Review of Systems  All other systems reviewed and are negative.       Objective    BP 111/62   Pulse (!) 123   Temp 98.1 F (36.7 C) (Oral)   Resp 16   Wt 156 lb 3.2 oz (70.9 kg)   SpO2 95%   BMI 32.65 kg/m   Physical Exam Vitals and nursing note reviewed.  Constitutional:      General: She is not in acute distress. Neck:     Thyroid: No thyromegaly.  Cardiovascular:     Rate and Rhythm: Normal rate and regular rhythm.  Pulmonary:     Effort: Pulmonary effort is normal.     Breath sounds: Normal breath sounds.  Abdominal:     Palpations: Abdomen is soft.     Tenderness: There is no abdominal tenderness.  Musculoskeletal:     Cervical back: Normal range of motion and neck supple.     Right lower leg: No edema.     Left lower leg: No edema.  Neurological:     General: No  focal deficit present.     Mental Status: She is alert and oriented to person, place, and time.  Psychiatric:        Mood and Affect: Mood normal.        Behavior: Behavior normal.         Assessment & Plan:   1. Type 2 diabetes mellitus with other circulatory complication, with long-term current use of insulin (HCC) Increasing A1c and no longer at goal. Patient has had multiple courses of steroids. Will continue present management and monitor - POCT glycosylated hemoglobin (Hb A1C)  2. Essential hypertension Appears stable with present management. continue  3. Hyperlipidemia, unspecified hyperlipidemia type Continue present management  4. Malignant neoplasm metastatic to bone [facility]) Management as per consultant    No follow-ups on file.   Becky Sax, MD

## 2021-09-07 ENCOUNTER — Other Ambulatory Visit: Payer: Self-pay | Admitting: Family Medicine

## 2021-09-07 DIAGNOSIS — Z794 Long term (current) use of insulin: Secondary | ICD-10-CM

## 2021-09-10 ENCOUNTER — Other Ambulatory Visit: Payer: Self-pay | Admitting: Family Medicine

## 2021-09-10 DIAGNOSIS — E785 Hyperlipidemia, unspecified: Secondary | ICD-10-CM

## 2021-09-25 NOTE — Progress Notes (Signed)
Patient Care Team: Dorna Mai, MD as PCP - General (Family Medicine) Eppie Gibson, MD as Attending Physician (Radiation Oncology) [name], MD as Consulting Physician (Hematology and Oncology) Loletha Carrow Kirke Corin, MD as Consulting Physician (Gastroenterology) Delice Bison Charlestine Massed, NP as Nurse Practitioner (Hematology and Oncology) [name], RPH-CPP (Pharmacist)  DIAGNOSIS:  Encounter Diagnosis  Name Primary?   Malignant neoplasm of lower-inner quadrant of right breast of female, estrogen receptor positive ([place])     SUMMARY OF ONCOLOGIC HISTORY: Oncology History  Malignant neoplasm of lower-inner quadrant of right breast of female, estrogen receptor positive ([place])  02/19/2015 Mammogram   Right breast irregular mass lower inner quadrant 2.2 x 1.3 x 1.7 cm with architectural distortion and skin/nipple retraction, T2 N0 stage II a clinical stage, additional benign cysts largest 1.2 cm   02/20/2015 Initial Diagnosis   Right breast biopsy 5:00: Invasive ductal carcinoma grade 2, perineural invasion present, ER 90%, PR 70%, HER-2 negative ratio 0.95, Ki-67 15%    03/07/2015 Breast MRI   right breast inferior subareolar mass measuring 2.2 x 2.4 x 2.5 cm with skin and nipple enhancement, extending inferiorly and laterally is intraductal enhancement over a distance of 4 cm, several level I right axillary lymph nodes with cortical thickening   03/13/2015 -  Anti-estrogen oral therapy   Neoadjuvant antiestrogen therapy with tamoxifen 20 mg daily (because the patient did not want to undergo surgery immediately for multiple family reasons) stopped in late 2017; anastrozole started 05/11/18   05/23/2018 Imaging   CT CAP: 2.9 cm spiculated soft tissue density central right breast, no evidence of soft tissue metastatic disease within the chest abdomen pelvis.  Subcentimeter sclerotic bone lesions T10, left pubis, right posterior ilium Bone scan: Foci of uptake left frontal  calvarium, T-spine, right ilium   10/04/2018 - 02/2021 Anti-estrogen oral therapy    Anastrozole with Delton See started April 2020 Ibrance added 09/29/2018, Faslodex 11/20/2020 discontinued January 2023 (due to progression and toxicity)   11/16/2018 Surgery   Right mastectomy Emily Wilson): IDC, grade 1, 5.2cm, grossly involving underlying skin, clear margins.   12/07/2018 Cancer Staging   Staging form: Breast, AJCC 7th Edition - Pathologic stage from 12/07/2018: Stage IV (yT4b, NX, M1) - Signed by Eppie Gibson, MD on 12/07/2018   12/18/2018 - 01/30/2019 Radiation Therapy   12/18/2018 through 01/30/2019 Site Technique Total Dose (Gy) Dose per Fx (Gy) Completed Fx Beam Energies  Chest Wall, Right: CW_Rt 3D 50/50 2 25/25 10X, 6X  Chest Wall, Right: CW_Rt_SCV_PAB 3D 50/50 2 25/25 6X, 10X  Chest Wall, Right: CW_Rt_Bst Electron 10/10 2 5/5 6E    05/16/2019 Miscellaneous   [facility] molecular testing: ER positive, PI K3 CA mutation present, HER-2 negative, ER positive TMB low BRCA1 not detected, ESR 1 not detected, PD-L1 negative   11/19/2020 - 12/02/2020 Radiation Therapy   11/19/2020 through 12/02/2020 Site Technique Total Dose (Gy) Dose per Fx (Gy) Completed Fx Beam Energies  Thoracic Spine: Spine_ T8-T11 3D 30/30 3 10/10 10X, 15X  Femur Left: Ext_Lt_femur Complex 30/30 3 10/10 10X  Lumbar Spine: Spine_L4-S4 3D 30/30 3 10/10 10X, 15X    04/11/2021 Treatment Plan Change   Verzinio with letrozole along with Xgeva started 04/11/2021   06/16/2021 Imaging   CT chest/abd/pelivs w/contrast  IMPRESSION: 1. No evidence of breast cancer carcinoma progression. 2. New linear interstitial thickening and bronchiectasis in the medial RIGHT lower lobe. Query radiation treatment or aspiration. 3. Multifocal skeletal metastasis involving the pelvis, spine, ribs and shoulder girdles. No interval  change.     CHIEF COMPLIANT: Follow-up after scans metastatic breast cancer  INTERVAL HISTORY: Emily Wilson is a  56  y.o. with above-mentioned history of metastatic breast cancer currently on anastrozole and Ibrance. She presents to the clinic today for a follow-up. Denies diarrhea but has a lot of abdominal pain. She does has hot flashes. She recently had a bladder infection and blood in urine. She does have tightness in right hip. Complains of right knee pain.  Denies any fevers or chills.   ALLERGIES:  is allergic to doxycycline, naproxen, oxycodone-acetaminophen, codeine, hydrocodone-acetaminophen, ibuprofen, lantus [insulin glargine], meloxicam, propoxyphene n-acetaminophen, sulfa antibiotics, sulfonamide derivatives, and tramadol.  MEDICATIONS:  Current Outpatient Medications  Medication Sig Dispense Refill   levofloxacin (LEVAQUIN) 500 MG tablet Take 1 tablet (500 mg total) by mouth daily. 7 tablet 0   metroNIDAZOLE (FLAGYL) 500 MG tablet Take 1 tablet (500 mg total) by mouth 3 (three) times daily. 21 tablet 0   abemaciclib (VERZENIO) 50 MG tablet Take 1 tablet (50 mg total) by mouth 2 (two) times daily. Swallow tablets whole. Do not chew, crush, or split tablets before swallowing. 56 tablet 3   Accu-Chek FastClix Lancets MISC USE TO TEST BLOOD SUGAR UP TO FOUR TIMES DAILY AS DIRECTED 204 each 2   BD INSULIN SYRINGE U/F 31G X 5/16" 0.5 ML MISC USE TO ADMINISTER INSULIN 3 TIMES DAILY WITH MEALS 100 each 3   bismuth subsalicylate (PEPTO BISMOL) 262 MG/15ML suspension Take 30 mLs by mouth every 6 (six) hours as needed.     denosumab (XGEVA) 120 MG/1.7ML SOLN injection Inject 120 mg into the skin every 3 (three) months.     diclofenac (VOLTAREN) 75 MG EC tablet TAKE 1 TABLET BY MOUTH 2 TIMES DAILY AS NEEDED. 60 tablet 2   dicyclomine (BENTYL) 20 MG tablet Take 1 tablet (20 mg total) by mouth every 6 (six) hours. 120 tablet 0   gabapentin (NEURONTIN) 100 MG capsule Take 3 capsules (300 mg total) by mouth 3 (three) times daily. 90 capsule 1   glucose blood (ACCU-CHEK GUIDE) test strip USE TO CHECK BLOOD SUGAR UP  TO 4 TIMES A DAY (E11.65) 200 strip 2   HYDROmorphone (DILAUDID) 4 MG tablet Take 1 tablet (4 mg total) by mouth every 4 (four) hours as needed for severe pain. 10 tablet 0   insulin NPH Human (HUMULIN N) 100 UNIT/ML injection INJECT 12 UNITS INTO THE SKIN AT BEDTIME 10 mL 1   insulin regular (NOVOLIN R) 100 units/mL injection Inject 0.12 mLs (12 Units total) into the skin 3 (three) times daily before meals. E.11.9. Hold dose for blood sugar less than 125 10 mL 11   letrozole (FEMARA) 2.5 MG tablet TAKE 1 TABLET BY MOUTH EVERY DAY 90 tablet 3   loperamide (IMODIUM) 2 MG capsule Take 2 mg by mouth as needed for diarrhea or loose stools. Take 2 tabs (4 mg) with first loose stool, then 1 tab (2 mg) with each additional loose stool. Do not take more than 8 tabs (16 mg) in a 24-hour period.     meclizine (ANTIVERT) 25 MG tablet Take 1 tablet (25 mg total) by mouth 3 (three) times daily as needed for dizziness. 30 tablet 0   metoprolol succinate (TOPROL-XL) 25 MG 24 hr tablet Take 1 tablet (25 mg total) by mouth daily. 90 tablet 3   Milk Thistle 1000 MG CAPS Take 1,000 mg by mouth daily as needed.     nitrofurantoin, macrocrystal-monohydrate, (MACROBID) 100  MG capsule Take 1 capsule (100 mg total) by mouth 2 (two) times daily. 10 capsule 0   ondansetron (ZOFRAN ODT) 8 MG disintegrating tablet Take 1 tablet (8 mg total) by mouth every 8 (eight) hours as needed for nausea or vomiting. 20 tablet 1   PEG-KCl-NaCl-NaSulf-Na Asc-C (PLENVU) 140 g SOLR Take 1 kit by mouth as directed. Use coupon: BIN: [id number] PNC: CNRX Group: [id number] ID: [id number] 1 each 0   rosuvastatin (CRESTOR) 40 MG tablet TAKE 1 TABLET BY MOUTH EVERY DAY 90 tablet 0   No current facility-administered medications for this visit.    PHYSICAL EXAMINATION: ECOG PERFORMANCE STATUS: 1 - Symptomatic but completely ambulatory  Vitals:   10/06/21 0939  BP: 97/66  Pulse: 86  Resp: 18  Temp: 97.9 F (36.6 C)  SpO2: 98%   Filed Weights    10/06/21 0939  Weight: 158 lb 9.6 oz (71.9 kg)     LABORATORY DATA:  I have reviewed the data as listed    Latest Ref Rng & Units 09/30/2021   11:50 AM 07/14/2021    9:32 AM 06/22/2021    8:58 AM  CMP  Glucose 70 - 99 mg/dL  86  235   BUN 6 - 20 mg/dL  18  17   Creatinine 0.44 - 1.00 mg/dL 0.70  0.76  0.85   Sodium 135 - 145 mmol/L  139  137   Potassium 3.5 - 5.1 mmol/L  4.2  4.2   Chloride 98 - 111 mmol/L  107  106   CO2 22 - 32 mmol/L  27  26   Calcium 8.9 - 10.3 mg/dL  9.4  8.9   Total Protein 6.5 - 8.1 g/dL  7.3  6.9   Total Bilirubin 0.3 - 1.2 mg/dL  0.4  0.4   Alkaline Phos 38 - 126 U/L  107  90   AST 15 - 41 U/L  17  14   ALT 0 - 44 U/L  17  14     Lab Results  Component Value Date   WBC 4.0 10/06/2021   HGB 12.9 10/06/2021   HCT 37.6 10/06/2021   MCV 95.7 10/06/2021   PLT 319 10/06/2021   NEUTROABS 2.5 10/06/2021    ASSESSMENT & PLAN:  Malignant neoplasm of lower-inner quadrant of right breast of female, estrogen receptor positive ([place]) Right breast irregular mass lower inner quadrant retroareolar 02/19/2015: 2.2 x 1.3 x 1.7 cm with architectural distortion and skin/nipple retraction, T2 N0 stage II a clinical stage, additional benign cysts largest 1.2 cm Right breast biopsy 02/20/2015 5:00: Invasive ductal carcinoma grade 2, perineural invasion present, ER 90%, PR 70%, HER-2 negative ratio 0.95, Ki-67 15%     Breast MRI 03/07/2015: Subareolar mass 2.2 x 2.4 x 2.5 cm extending into the retracted nipple, extending inferiorly and laterally over a distance of 4 cm, several level I right axillary lymph nodes with cortical thickening up to 7 mm with a maximum diameter of 15 mm   11/16/2018:Right mastectomy Emily Wilson) performed because the tumor was fungating: IDC, grade 1, 5.2cm, grossly involving underlying skin, clear margins. Radiation to chest wall: 12/19/18- 01/30/19 Bone scan 06/26/2019: Stable distribution of bone metastases. CT CAP 04/28/2020: Mixed lytic and  sclerotic bone metastases several demonstrating increased lytic character concern for worsening bone mets. 05/16/2019:[facility] molecular testing: ER positive, PIK3CA mutation present, HER-2 negative, ER positive TMB low BRCA1 not detected, ESR 1 not detected, PD-L1 negative --------------------------------------------------------------------- Treatment summary: Anastrozole with Delton See started April 2020 Leslee Home  added 09/29/2018, Faslodex 11/20/2020 discontinued January 2023 (due to progression and toxicity)   Current treatment: Verzinio with letrozole along with Xgeva started 04/11/2021     CT CAP 06/16/2021: Stable bone metastases.  Interstitial thickening and bronchiectasis right lower lobe CT CAP 10/01/2021: New 1.6 cm hypodensity in left hepatic segment.  Sigmoid diverticulitis progressive pelvic metastatic disease with cortical breakthrough into left iliac lesions  Recommendation: Participation in [place] 1 clinical trial with Gedatolisib vs Xeloda versus Piqray  Radiation oncology consult regarding left iliac bone radiation.  She is interested in getting the 1 dose radiation protocol.  Diverticulitis: I sent the patient on antibiotics with metronidazole 3 times a day and Levaquin once a day. Patient is diabetic and there is some concern for PICC related hyperglycemia issues. She also was not keen on Faslodex because apparently she is intolerant to sulfur and Faslodex might have sulfur in it.  She will receive Xgeva today. Return to clinic in 2 weeks to discuss the final plan.   No orders of the defined types were placed in this encounter.  The patient has a good understanding of the overall plan. she agrees with it. she will call with any problems that may develop before the next visit here. Total time spent: 30 mins including face to face time and time spent for planning, charting and co-ordination of care   Harriette Ohara, MD 10/06/21    I Gardiner Coins am scribing for Dr.  Lindi Adie  I have reviewed the above documentation for accuracy and completeness, and I agree with the above.

## 2021-09-29 ENCOUNTER — Other Ambulatory Visit (HOSPITAL_COMMUNITY): Payer: Self-pay

## 2021-09-29 ENCOUNTER — Ambulatory Visit: Payer: Self-pay

## 2021-09-29 ENCOUNTER — Ambulatory Visit (INDEPENDENT_AMBULATORY_CARE_PROVIDER_SITE_OTHER): Payer: Medicare Other | Admitting: Family Medicine

## 2021-09-29 ENCOUNTER — Encounter: Payer: Self-pay | Admitting: Family Medicine

## 2021-09-29 ENCOUNTER — Other Ambulatory Visit: Payer: Self-pay | Admitting: Family Medicine

## 2021-09-29 VITALS — BP 117/81 | HR 78 | Temp 98.1°F | Resp 16 | Wt 159.0 lb

## 2021-09-29 DIAGNOSIS — R300 Dysuria: Secondary | ICD-10-CM | POA: Diagnosis not present

## 2021-09-29 LAB — POCT URINALYSIS DIP (CLINITEK)
Bilirubin, UA: NEGATIVE
Blood, UA: NEGATIVE
Glucose, UA: NEGATIVE mg/dL
Ketones, POC UA: NEGATIVE mg/dL
Nitrite, UA: NEGATIVE
POC PROTEIN,UA: NEGATIVE
Spec Grav, UA: 1.01 (ref 1.010–1.025)
Urobilinogen, UA: 0.2 E.U./dL
pH, UA: 6 (ref 5.0–8.0)

## 2021-09-29 MED ORDER — NITROFURANTOIN MONOHYD MACRO 100 MG PO CAPS: 100.0000 mg | ORAL_CAPSULE | Freq: Two times a day (BID) | ORAL | 0 refills | Status: DC

## 2021-09-29 MED ORDER — FLUCONAZOLE 150 MG PO TABS: 150.0000 mg | ORAL_TABLET | Freq: Once | ORAL | 0 refills | Status: AC

## 2021-09-29 NOTE — Telephone Encounter (Signed)
Patient given appt today.

## 2021-09-29 NOTE — Telephone Encounter (Signed)
  Chief Complaint: Urinary problems Symptoms: Blood in urine, Burning sensation, incontinence, flank pain Frequency: 3 days ago Pertinent Negatives: Patient denies Fever Disposition: '[]'$ ED /'[]'$ Urgent Care (no appt availability in office) / '[x]'$ Appointment(In office/virtual)/ '[]'$  [place] [facility]/ '[]'$ Home Care/ '[]'$ Refused Recommended Disposition /'[]'$ [facility]/ '[]'$  Follow-up with PCP Additional Notes: PT started with burning sensation 3 days ago. Today pt noticed blood in urine. PT also having flank pain. Reason for Disposition  Side (flank) or lower back pain present  Answer Assessment - Initial Assessment Questions 1. SYMPTOM: "What's the main symptom you're concerned about?" (e.g., frequency, incontinence)     Pain, blood in urine, urine leakage, burning 2. ONSET: "When did the  leakage  start?"     3 days ago, just noticed blood in urine 3. PAIN: "Is there any pain?" If Yes, ask: "How bad is it?" (Scale: 1-10; mild, moderate, severe)     Burning 4. CAUSE: "What do you think is causing the symptoms?"     UTI 5. OTHER SYMPTOMS: "Do you have any other symptoms?" (e.g., blood in urine, fever, flank pain, pain with urination)     Blood in urine, flank pain 6. PREGNANCY: "Is there any chance you are pregnant?" "When was your last menstrual period?"     na  Protocols used: Urinary Symptoms-A-AH

## 2021-09-29 NOTE — Progress Notes (Unsigned)
Established Patient Office Visit  Subjective    Patient ID: Emily Wilson, female    DOB: 05/28/1965  Age: 56 y.o. MRN: [id number]  CC:  Chief Complaint  Patient presents with   Urinary Tract Infection    HPI SHRUTHI NORTHRUP presents with complaint of dysuria. Patient denies fever/chills or viral sx.    Outpatient Encounter Medications as of 09/29/2021  Medication Sig   abemaciclib (VERZENIO) 50 MG tablet Take 1 tablet (50 mg total) by mouth 2 (two) times daily. Swallow tablets whole. Do not chew, crush, or split tablets before swallowing.   Accu-Chek FastClix Lancets MISC USE TO TEST BLOOD SUGAR UP TO FOUR TIMES DAILY AS DIRECTED   BD INSULIN SYRINGE U/F 31G X 5/16" 0.5 ML MISC USE TO ADMINISTER INSULIN 3 TIMES DAILY WITH MEALS   bismuth subsalicylate (PEPTO BISMOL) 262 MG/15ML suspension Take 30 mLs by mouth every 6 (six) hours as needed.   denosumab (XGEVA) 120 MG/1.7ML SOLN injection Inject 120 mg into the skin every 3 (three) months.   diclofenac (VOLTAREN) 75 MG EC tablet TAKE 1 TABLET BY MOUTH 2 TIMES DAILY AS NEEDED.   dicyclomine (BENTYL) 20 MG tablet Take 1 tablet (20 mg total) by mouth every 6 (six) hours.   gabapentin (NEURONTIN) 100 MG capsule Take 3 capsules (300 mg total) by mouth 3 (three) times daily.   glucose blood (ACCU-CHEK GUIDE) test strip USE TO CHECK BLOOD SUGAR UP TO 4 TIMES A DAY (E11.65)   HYDROmorphone (DILAUDID) 4 MG tablet Take 1 tablet (4 mg total) by mouth every 4 (four) hours as needed for severe pain.   insulin NPH Human (HUMULIN N) 100 UNIT/ML injection INJECT 12 UNITS INTO THE SKIN AT BEDTIME   insulin regular (NOVOLIN R) 100 units/mL injection Inject 0.12 mLs (12 Units total) into the skin 3 (three) times daily before meals. E.11.9. Hold dose for blood sugar less than 125   letrozole (FEMARA) 2.5 MG tablet TAKE 1 TABLET BY MOUTH EVERY DAY   loperamide (IMODIUM) 2 MG capsule Take 2 mg by mouth as needed for diarrhea or loose stools. Take 2 tabs  (4 mg) with first loose stool, then 1 tab (2 mg) with each additional loose stool. Do not take more than 8 tabs (16 mg) in a 24-hour period.   meclizine (ANTIVERT) 25 MG tablet Take 1 tablet (25 mg total) by mouth 3 (three) times daily as needed for dizziness.   metoprolol succinate (TOPROL-XL) 25 MG 24 hr tablet Take 1 tablet (25 mg total) by mouth daily.   Milk Thistle 1000 MG CAPS Take 1,000 mg by mouth daily as needed.   ondansetron (ZOFRAN ODT) 8 MG disintegrating tablet Take 1 tablet (8 mg total) by mouth every 8 (eight) hours as needed for nausea or vomiting.   PEG-KCl-NaCl-NaSulf-Na Asc-C (PLENVU) 140 g SOLR Take 1 kit by mouth as directed. Use coupon: BIN: [id number] PNC: CNRX Group: [id number] ID: [id number]   rosuvastatin (CRESTOR) 40 MG tablet TAKE 1 TABLET BY MOUTH EVERY DAY   No facility-administered encounter medications on file as of 09/29/2021.    Past Medical History:  Diagnosis Date   Anxiety state, unspecified    Arthritis    Breast cancer of lower-inner quadrant of right female breast [facility])    Carpal tunnel syndrome    Complication of anesthesia    woke up during ganglion cyst removal in her 20's, not woken up since   Diabetes mellitus without complication (HCC)    Type  II   Diverticulosis    Heart murmur    History of radiation therapy 12/18/18- 01/30/19   Right Chest wall 25 fractions X 2Gy each to total 50 Gy, followed by a boost 10 Gy in 5 fractions.    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, without mention of thyrotoxic crisis or storm     Past Surgical History:  Procedure Laterality Date   ABDOMINAL HYSTERECTOMY     ANTERIOR CRUCIATE LIGAMENT REPAIR Right 2007   ACL repair   APPENDECTOMY     CARPAL TUNNEL RELEASE Right    GANGLION CYST EXCISION Right    TOTAL MASTECTOMY Right 11/16/2018   Procedure: RIGHT MASTECTOMY;  Surgeon: Coralie Keens, MD;  Location: [facility];  Service: General;  Laterality: Right;   TUBAL LIGATION      Family History  Problem Relation Age of Onset   Diabetes Mother    Bladder Cancer Mother 18       smoker   Other Mother 16       TAH for unspecified reason   Multiple sclerosis Mother    Kidney failure Mother    Cancer Father 56       lymphatic/tonsil cancer - in remission; former smoker   Diabetes Sister    Diabetes Sister    Heart attack Brother    COPD Maternal Grandmother        smoker   Emphysema Maternal Grandmother        smoker   Diabetes Maternal Grandmother    Stroke Maternal Grandfather    Diabetes Paternal Grandmother    Dementia Maternal Uncle    COPD Maternal Uncle        smoker   Multiple sclerosis Paternal Aunt    Breast cancer Paternal Aunt        dx. late 24s - early 46s    Social History   Socioeconomic History   Marital status: Divorced    Spouse name: Not on file   Number of children: 2   Years of education: Not on file   Highest education level: Not on file  Occupational History   Occupation: Disabled  Tobacco Use   Smoking status: Former    Packs/day: 3.00    Years: 30.00    Total pack years: 90.00    Types: Cigarettes    Quit date: 10/24/2011    Years since quitting: 9.9   Smokeless tobacco: Never  Vaping Use   Vaping Use: Never used  Substance and Sexual Activity   Alcohol use: No   Drug use: Never   Sexual activity: Not Currently  Other Topics Concern   Not on file  Social History Narrative   In [place] w/boyfriend   Works Chiropractor, Scientist, water quality   Social Determinants of Health   Financial Resource Strain: Medium Risk (05/01/2021)   Overall Financial Resource Strain (CARDIA)    Difficulty of Paying Living Expenses: Somewhat hard  Food Insecurity: No Food Insecurity (05/01/2021)   Hunger Vital Sign    Worried About Running Out of Food in the Last Year: Never true    Ran Out of Food in the Last Year: Never true  Transportation Needs: No  Transportation Needs (05/01/2021)   PRAPARE - Hydrologist (Medical): No    Lack of Transportation (Non-Medical): No  Physical Activity:  Inactive (05/01/2021)   Exercise Vital Sign    Days of Exercise per Week: 0 days    Minutes of Exercise per Session: 0 min  Stress: Stress Concern Present (05/01/2021)   [place]    Feeling of Stress : Very much  Social Connections: Socially Isolated (05/01/2021)   Social Connection and Isolation Panel [NHANES]    Frequency of Communication with Friends and Family: More than three times a week    Frequency of Social Gatherings with Friends and Family: More than three times a week    Attends Religious Services: Never    Marine scientist or Organizations: No    Attends Archivist Meetings: Never    Marital Status: Divorced  Human resources officer Violence: Not At Risk (05/01/2021)   Humiliation, Afraid, Rape, and Kick questionnaire    Fear of Current or Ex-Partner: No    Emotionally Abused: No    Physically Abused: No    Sexually Abused: No    Review of Systems  Genitourinary:  Positive for dysuria. Negative for hematuria.  All other systems reviewed and are negative.       Objective    BP 117/81 (BP Location: Right Arm, Patient Position: Sitting, Cuff Size: Normal)   Pulse 78   Temp 98.1 F (36.7 C) (Oral)   Resp 16   Wt 159 lb (72.1 kg)   SpO2 93%   BMI 33.23 kg/m   Physical Exam Vitals and nursing note reviewed.  Constitutional:      General: She is not in acute distress. Cardiovascular:     Rate and Rhythm: Normal rate and regular rhythm.  Pulmonary:     Effort: Pulmonary effort is normal.     Breath sounds: Normal breath sounds.  Abdominal:     Palpations: Abdomen is soft.     Tenderness: There is no abdominal tenderness.  Neurological:     General: No focal deficit present.     Mental Status: She is alert and oriented  to person, place, and time.     {Labs (Optional):23779}    Assessment & Plan:   1. Dysuria Urine for culture. Macrobid and diflucan prescribed. Adequate and appropriate fluids recommended - POCT URINALYSIS DIP (CLINITEK)    No follow-ups on file.   Becky Sax, MD

## 2021-09-29 NOTE — Progress Notes (Signed)
Patient is here for possible UTI. Patient c/o burning  x 2 days. Patient said she has been using AZO for comfort

## 2021-09-30 ENCOUNTER — Ambulatory Visit (HOSPITAL_COMMUNITY)
Admission: RE | Admit: 2021-09-30 | Discharge: 2021-09-30 | Disposition: A | Discharge status: Home / Self Care | Payer: Medicare Other | Source: Ambulatory Visit | Attending: Adult Health | Admitting: Adult Health

## 2021-09-30 DIAGNOSIS — Z170 Estrogen receptor positive status [ER+]: Secondary | ICD-10-CM | POA: Diagnosis present

## 2021-09-30 DIAGNOSIS — E119 Type 2 diabetes mellitus without complications: Secondary | ICD-10-CM | POA: Diagnosis present

## 2021-09-30 DIAGNOSIS — C50311 Malignant neoplasm of lower-inner quadrant of right female breast: Secondary | ICD-10-CM | POA: Diagnosis present

## 2021-09-30 DIAGNOSIS — C7951 Secondary malignant neoplasm of bone: Secondary | ICD-10-CM | POA: Insufficient documentation

## 2021-09-30 DIAGNOSIS — Z794 Long term (current) use of insulin: Secondary | ICD-10-CM | POA: Diagnosis present

## 2021-09-30 LAB — URINE CULTURE: Culture: <10000 — AB

## 2021-09-30 LAB — POCT I-STAT CREATININE: Creatinine, Ser: 0.7 mg/dL (ref 0.44–1.00)

## 2021-09-30 MED ORDER — IOHEXOL 300 MG/ML  SOLN
100.0000 mL | Freq: Once | INTRAMUSCULAR | Status: AC | PRN
  Administered 2021-09-30: 100 mL via INTRAVENOUS

## 2021-10-01 ENCOUNTER — Other Ambulatory Visit (HOSPITAL_COMMUNITY): Payer: Self-pay

## 2021-10-06 ENCOUNTER — Other Ambulatory Visit: Payer: Self-pay

## 2021-10-06 ENCOUNTER — Inpatient Hospital Stay: Payer: Medicare Other | Attending: Hematology and Oncology | Admitting: Hematology and Oncology

## 2021-10-06 ENCOUNTER — Inpatient Hospital Stay: Payer: Medicare Other

## 2021-10-06 DIAGNOSIS — Z923 Personal history of irradiation: Secondary | ICD-10-CM | POA: Insufficient documentation

## 2021-10-06 DIAGNOSIS — Z9011 Acquired absence of right breast and nipple: Secondary | ICD-10-CM | POA: Insufficient documentation

## 2021-10-06 DIAGNOSIS — C50311 Malignant neoplasm of lower-inner quadrant of right female breast: Secondary | ICD-10-CM | POA: Diagnosis present

## 2021-10-06 DIAGNOSIS — Z170 Estrogen receptor positive status [ER+]: Secondary | ICD-10-CM

## 2021-10-06 DIAGNOSIS — Z87891 Personal history of nicotine dependence: Secondary | ICD-10-CM | POA: Diagnosis not present

## 2021-10-06 DIAGNOSIS — Z79899 Other long term (current) drug therapy: Secondary | ICD-10-CM | POA: Insufficient documentation

## 2021-10-06 DIAGNOSIS — Z79811 Long term (current) use of aromatase inhibitors: Secondary | ICD-10-CM | POA: Diagnosis not present

## 2021-10-06 DIAGNOSIS — C7951 Secondary malignant neoplasm of bone: Secondary | ICD-10-CM | POA: Insufficient documentation

## 2021-10-06 LAB — CBC WITH DIFFERENTIAL (CANCER CENTER ONLY)
Abs Immature Granulocytes: 0.01 10*3/uL (ref 0.00–0.07)
Basophils Absolute: 0.1 10*3/uL (ref 0.0–0.1)
Basophils Relative: 2 %
Eosinophils Absolute: 0.3 10*3/uL (ref 0.0–0.5)
Eosinophils Relative: 6 %
HCT: 37.6 % (ref 36.0–46.0)
Hemoglobin: 12.9 g/dL (ref 12.0–15.0)
Immature Granulocytes: 0 %
Lymphocytes Relative: 19 %
Lymphs Abs: 0.8 10*3/uL (ref 0.7–4.0)
MCH: 32.8 pg (ref 26.0–34.0)
MCHC: 34.3 g/dL (ref 30.0–36.0)
MCV: 95.7 fL (ref 80.0–100.0)
Monocytes Absolute: 0.5 10*3/uL (ref 0.1–1.0)
Monocytes Relative: 11 %
Neutro Abs: 2.5 10*3/uL (ref 1.7–7.7)
Neutrophils Relative %: 62 %
Platelet Count: 319 10*3/uL (ref 150–400)
RBC: 3.93 MIL/uL (ref 3.87–5.11)
RDW: 12.1 % (ref 11.5–15.5)
WBC Count: 4 10*3/uL (ref 4.0–10.5)
nRBC: 0 % (ref 0.0–0.2)

## 2021-10-06 LAB — CMP (CANCER CENTER ONLY)
ALT: 18 U/L (ref 0–44)
AST: 20 U/L (ref 15–41)
Albumin: 3.6 g/dL (ref 3.5–5.0)
Alkaline Phosphatase: 106 U/L (ref 38–126)
Anion gap: 7 (ref 5–15)
BUN: 16 mg/dL (ref 6–20)
CO2: 24 mmol/L (ref 22–32)
Calcium: 9.5 mg/dL (ref 8.9–10.3)
Chloride: 110 mmol/L (ref 98–111)
Creatinine: 0.9 mg/dL (ref 0.44–1.00)
GFR, Estimated: >60 mL/min (ref >60)
Glucose, Bld: 172 mg/dL — ABNORMAL HIGH (ref 70–99)
Potassium: 4 mmol/L (ref 3.5–5.1)
Sodium: 141 mmol/L (ref 135–145)
Total Bilirubin: 0.5 mg/dL (ref 0.3–1.2)
Total Protein: 7.1 g/dL (ref 6.5–8.1)

## 2021-10-06 MED ORDER — LEVOFLOXACIN 500 MG PO TABS: 500.0000 mg | ORAL_TABLET | Freq: Every day | ORAL | 0 refills | Status: DC

## 2021-10-06 MED ORDER — METRONIDAZOLE 500 MG PO TABS: 500.0000 mg | ORAL_TABLET | Freq: Three times a day (TID) | ORAL | 0 refills | Status: DC

## 2021-10-06 MED ORDER — DENOSUMAB 120 MG/1.7ML ~~LOC~~ SOLN
120.0000 mg | Freq: Once | SUBCUTANEOUS | Status: AC
  Administered 2021-10-06: 120 mg via SUBCUTANEOUS
  Filled 2021-10-06: qty 1.7

## 2021-10-06 NOTE — Research (Signed)
[place] 1: A Phase 3 Study Comparing Gedatolisib in Combination with Palbociclib and Fulvestrant to Standard-of-Care Therapies in Patients with HR-Positive, HER2-Negative Advanced Breast Cancer Previously Treated with CDK4/6 Inhibitor in Combination with Non-Steroidal Aromatase Inhibitor Therapy  Patient Emily Wilson was identified by Dr Lindi Adie as a potential candidate for the above listed study. This Clinical Research Nurse met with [name], [id number], on 10/06/21 in a manner and location that ensures patient privacy to discuss participation in the above listed research study. Patient is Unaccompanied.  A copy of the informed consent document with embedded HIPAA language was provided to the patient. Patient reads, speaks, and understands [place]. Patient was provided with the business card of this Nurse and encouraged to contact the research team with any questions. Approximately 20 minutes were spent with the patient reviewing the informed consent documents. Patient was provided the option of taking informed consent documents home to review and was encouraged to review at their convenience with their support network, including other care providers. Patient took the consent documents home to review.  Patient expressed concerns regarding her medication allergies and study treatment. Will review inclusion/exclusion criteria and follow-up with patient later this week.  Vickii Penna, RN, BSN, CPN Clinical Research Nurse I [phone]  10/06/2021 10:26 AM

## 2021-10-06 NOTE — Progress Notes (Signed)
Ms. Leavelle presents today to discuss possibility of receiving radiation to to left iliac lesion   CT C/A/P w/ Contrast  09/30/2021 --IMPRESSION: No evidence of progressive metastatic disease in the chest including in the visualized bony metastases. COPD change with multiple tiny nodules, most of which are fissural, stable. New 1.6 cm rim enhancing hypodensity anteriorly in the left hepatic lateral segment is most likely a metastasis, less likely a liver abscess. Rest of the liver enhances uniformly. Interval development of findings consistent with mid to distal sigmoid diverticulitis with increased wall thickening. Colonoscopy follow-up recommended after treatment to exclude underlying lesion. Constipation. Progressive pelvic metastatic disease with cortical breakthrough involving 2 left iliac lesions. The breakthrough occurred along the outer cortex of both lesions with no extension into the pelvis proper.  Past/Anticipated interventions by medical oncology, if any:  Under care of Dr. [name] 10/06/2021 --Treatment summary:  Anastrozole with Delton See started April 2020  Ibrance added 09/29/2018 Faslodex 11/20/2020 discontinued January 2023 (due to progression and toxicity) --Current treatment: Verzinio with letrozole along with Xgeva started 04/11/2021 CT CAP 06/16/2021: Stable bone metastases.  Interstitial thickening and bronchiectasis right lower lobe CT CAP 10/01/2021: New 1.6 cm hypodensity in left hepatic segment.  Sigmoid diverticulitis progressive pelvic metastatic disease with cortical breakthrough into left iliac lesions --Recommendation:  Participation in [place] 1 clinical trial with Gedatolisib vs Xeloda versus Piqray Radiation oncology consult regarding left iliac bone radiation.   She is interested in getting the 1 dose radiation protocol. Diverticulitis: I sent the patient on antibiotics with metronidazole 3 times a day and Levaquin once a day. Patient is diabetic and there  is some concern for PICC related hyperglycemia issues. She also was not keen on Faslodex because apparently she is intolerant to sulfur and Faslodex might have sulfur in it. She will receive Xgeva today. --Return to clinic in 2 weeks to discuss the final plan  SAFETY ISSUES: Prior radiation? Yes: 11/19/2020 through 12/02/2020 Site Technique Total Dose (Gy) Dose per Fx (Gy) Completed Fx Beam Energies  Thoracic Spine: Spine_ T8-T11 3D 30/30 3 10/10 10X, 15X  Femur Left: Ext_Lt_femur Complex 30/30 3 10/10 10X  Lumbar Spine: Spine_L4-S4 3D 30/30 3 10/10 10X, 15X    12/18/2018 through 01/30/2019 Site Technique Total Dose (Gy) Dose per Fx (Gy) Completed Fx Beam Energies  Chest Wall, Right: CW_Rt 3D 50/50 2 25/25 10X, 6X  Chest Wall, Right: CW_Rt_SCV_PAB 3D 50/50 2 25/25 6X, 10X  Chest Wall, Right: CW_Rt_Bst Electron 10/10 2 5/5 6E    Pacemaker/ICD? No Possible current pregnancy? No--hysterectomy  Is the patient on methotrexate? No

## 2021-10-06 NOTE — Progress Notes (Signed)
Radiation Oncology         [phone] ________________________________  Outpatient Re-Consultation  Name: Emily Wilson MRN: [id number]  Date: 10/07/2021  DOB: 03/25/1965  CC:Dorna Mai, MD  [name], MD   REFERRING PHYSICIAN: [name], MD  DIAGNOSIS:  No diagnosis found.  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, spine, ribs   Cancer Staging  Malignant neoplasm of lower-inner quadrant of right breast of female, estrogen receptor positive ([place]) Staging form: Breast, AJCC 7th Edition - Pathologic stage from 12/07/2018: Stage IV (yT4b, NX, M1) - Signed by Eppie Gibson, MD on 12/07/2018 Stage prefix: Post-therapy Laterality: Right Tumor grade (Scarff-Bloom-Richardson system): G1 Estrogen receptor status: Positive Progesterone receptor status: Negative HER2 status: Negative   CHIEF COMPLAINT: here to discuss radiation therapy in management of left iliac lesions  Narrative/ Interval History Re-consultation 10/07/21: The patient returns today for consideration of radiation therapy in management of 2 left iliac bone lesions from breast cancer primary. Since the patient was last seen for re-consultation on 08/11/21, she has continued on with anastrozole and Ibrance under the care of Dr. Lindi Adie.   Recently, the patient had a repeat CT of the chest abdomen and pelvis performed on 09/30/21 which showed a new 1.6 cm hypodensity in left hepatic segment; interval development of findings consistent with mid to distal sigmoid diverticulitis with increased wall thickening; and progressive pelvic metastatic disease with cortical breakthrough involving 2 left iliac lesions. The breakthrough was seen to occur along the outer cortex of both lesions, with no extension into the pelvis proper appreciated. Otherwise, no evidence of progressive metastatic disease in the chest was appreciated, including in the visualized bony metastases.   The patient accordingly met with  Dr. Lindi Adie yesterday, who recommended proceeding with radiation to the left iliac lesions, and participation in the [place] 1 clinical trial along with either Gedatolisib vs Xeloda vs Piqray. The patient specifically voiced interest in receiving the 1 dose radiation protocol. In terms current symptoms, the patient was noted to endorse intense abdominal pain, tightness in her right hip, right knee pain, and hot flashes. The patient was also noted to have a bladder infection and reported instances of hematuria, for which her PCP recently prescribed her Macrobid and diflucan for on 09/29/21.   HPI Re-consultation 08/11/21::Emily Wilson is a 56 y.o. female who presents today for consideration of further radiation therapy in management of a recent painful rib fracture. I last met with the patient on 01/07/21 for a 1 month post-RT follow-up. Since her last visit, the patient continued on with antiestrogen therapy consisting of anastrozole (started in April of 2020) and Ibrance (added on 09/29/2018) under the care of Dr. Lindi Adie. The patient also continued taking Faslodex (started on 11/20/20) until it was discontinued this past January due to progression and toxicity (detailed below).    CT of the chest abdomen and pelvis performed on 03/11/2021 showed mild progression of widespread osseous metastatic disease and a stable 3 mm subpleural nodule in the left upper lobe. CT otherwise showed no new masses or lymphadenopathy in the chest, abdomen or pelvis.  During a follow up visit with Dr.  Lindi Adie on 03/13/21, the patient endorsed significantly worse allergic symptoms with faslodex. Subsequently, the patient opted to stop taking faslodex that same date. In regards to mild progression noted on her most recent CT, the patient opted to proceed with Verzenio along with letrozole and Xgeva (started on 04/11/21).  On 04/30/21, the patient presented to the ED for evaluation  of nausea, vomiting, diarrhea and headache x  6 days. The patient noted recent improvement in most of her symptoms other than persistent nausea and diarrhea, along with mild vertigo. She also apparently attributed her symptoms to [facility] which she subsequently stopped taking (Iater resumed). Following evaluation, her symptoms were presumed to be likely viral in etiology, and she was discharged with instructions to follow up with her PCP. Lab results later came back showing findings consistent with a GI virus.   The patient also met with her gastroenterologist, Dr. Loletha Carrow, on 06/09/21 for evaluation of constipation, diarrhea, GERD, and dysphagia. She in scheduled to undergo an endoscopy and colonoscopy in the near future for further evaluation of this. She was also prescribed Plenvu.   CT of the chest abdomen and pelvis performed on 06/16/2021 showed stable bone metastases involving the pelvis, spine, and ribs.  CT also showed interstitial thickening and bronchiectasis involving the right lower lobe.  During a follow-up with Dr. Lindi Adie on 06/22/21, the patient endorsed cough and congestion. She also reported ongoing issues with dysphagia, and a recent episode of involuntary head shaking.  For her symptoms of cough and congestion, Dr. Lindi Adie prescribed her azithromycin.   On 07/17/21, the patient presented to the ED for evaluation of persistent rib pain following a sneeze the prior evening, with associated inspiratory pain and pain with movement. Chest x-ray showed a probable nondisplaced pathologic fracture of seventh rib as well as known osseous metastatic disease (consisting of lesions involving the posterior 6th 7th and 12th ribs and lateral 4th rib, with additional permeative appearing lesions seen in the anterior left 2nd rib, inferior left scapula and right humeral head).  She was given a prescription for Percocet and advised to follow-up with her PCP.   During her most recent follow up with Wilber Bihari NP on 07/22/21, the patient reported  increased pain associated with her recent rib fracture and osseous metastatic disease. Subsequently, the patient was referred to me here today for consideration of radiation to this area to manage her pain.   However, since the referral was placed, the patient reports that her pain is resolved.  She reports that the pain was located in the right lateral rib cage and she had an episode where she felt a "pop" and then the pain was gone.  She still has some baseline joint pain and carpal tunnel pain which is consistent with her usual state of health.  She denies any other pain elsewhere.    PREVIOUS RADIATION THERAPY: Yes   Intent: Palliative  Radiation Treatment Dates: 11/19/2020 through 12/02/2020 Site Technique Total Dose (Gy) Dose per Fx (Gy) Completed Fx Beam Energies  Thoracic Spine: Spine_ T8-T11 3D 30/30 3 10/10 10X, 15X  Femur Left: Ext_Lt_femur Complex 30/30 3 10/10 10X  Lumbar Spine: Spine_L4-S4 3D 30/30 3 10/10 10X, 15X   Intent: Palliative / local control  Radiation Treatment Dates: 12/18/2018 through 01/30/2019 Site Technique Total Dose (Gy) Dose per Fx (Gy) Completed Fx Beam Energies  Chest Wall, Right: CW_Rt 3D 50/50 2 25/25 10X, 6X  Chest Wall, Right: CW_Rt_SCV_PAB 3D 50/50 2 25/25 6X, 10X  Chest Wall, Right: CW_Rt_Bst Electron 10/10 2 5/5 6E    PAST MEDICAL HISTORY:  has a past medical history of Anxiety state, unspecified, Arthritis, Breast cancer of lower-inner quadrant of right female breast ([place]), Carpal tunnel syndrome, Complication of anesthesia, Diabetes mellitus without complication ([place]), Diverticulosis, Heart murmur, History of radiation therapy (12/18/18- 01/30/19), Hyperlipidemia, Hyperthyroidism, Mitral valve prolapse, Pneumonia, Smoker, Tachycardia, and  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    PAST SURGICAL HISTORY: Past Surgical History:  Procedure Laterality Date   ABDOMINAL HYSTERECTOMY      ANTERIOR CRUCIATE LIGAMENT REPAIR Right 2007   ACL repair   APPENDECTOMY     CARPAL TUNNEL RELEASE Right    GANGLION CYST EXCISION Right    TOTAL MASTECTOMY Right 11/16/2018   Procedure: RIGHT MASTECTOMY;  Surgeon: Coralie Keens, MD;  Location: [facility];  Service: General;  Laterality: Right;   TUBAL LIGATION      FAMILY HISTORY: family history includes Bladder Cancer (age of onset: 23) in her mother; Breast cancer in her paternal aunt; COPD in her maternal grandmother and maternal uncle; Cancer (age of onset: 33) in her father; Dementia in her maternal uncle; Diabetes in her maternal grandmother, mother, paternal grandmother, sister, and sister; Emphysema in her maternal grandmother; Heart attack in her brother; Kidney failure in her mother; Multiple sclerosis in her mother and paternal aunt; Other (age of onset: 77) in her mother; Stroke in her maternal grandfather.  SOCIAL HISTORY:  reports that she quit smoking about 9 years ago. Her smoking use included cigarettes. She has a 90.00 pack-year smoking history. She has never used smokeless tobacco. She reports that she does not drink alcohol and does not use drugs.  ALLERGIES: Doxycycline, Naproxen, Oxycodone-acetaminophen, Codeine, Hydrocodone-acetaminophen, Ibuprofen, Lantus [insulin glargine], Meloxicam, Propoxyphene n-acetaminophen, Sulfa antibiotics, Sulfonamide derivatives, and Tramadol  MEDICATIONS:  Current Outpatient Medications  Medication Sig Dispense Refill   abemaciclib (VERZENIO) 50 MG tablet Take 1 tablet (50 mg total) by mouth 2 (two) times daily. Swallow tablets whole. Do not chew, crush, or split tablets before swallowing. 56 tablet 3   Accu-Chek FastClix Lancets MISC USE TO TEST BLOOD SUGAR UP TO FOUR TIMES DAILY AS DIRECTED 204 each 2   BD INSULIN SYRINGE U/F 31G X 5/16" 0.5 ML MISC USE TO ADMINISTER INSULIN 3 TIMES DAILY WITH MEALS 100 each 3   bismuth subsalicylate (PEPTO BISMOL) 262 MG/15ML suspension Take 30 mLs by  mouth every 6 (six) hours as needed.     denosumab (XGEVA) 120 MG/1.7ML SOLN injection Inject 120 mg into the skin every 3 (three) months.     diclofenac (VOLTAREN) 75 MG EC tablet TAKE 1 TABLET BY MOUTH 2 TIMES DAILY AS NEEDED. 60 tablet 2   dicyclomine (BENTYL) 20 MG tablet Take 1 tablet (20 mg total) by mouth every 6 (six) hours. 120 tablet 0   gabapentin (NEURONTIN) 100 MG capsule Take 3 capsules (300 mg total) by mouth 3 (three) times daily. 90 capsule 1   glucose blood (ACCU-CHEK GUIDE) test strip USE TO CHECK BLOOD SUGAR UP TO 4 TIMES A DAY (E11.65) 200 strip 2   HYDROmorphone (DILAUDID) 4 MG tablet Take 1 tablet (4 mg total) by mouth every 4 (four) hours as needed for severe pain. 10 tablet 0   insulin NPH Human (HUMULIN N) 100 UNIT/ML injection INJECT 12 UNITS INTO THE SKIN AT BEDTIME 10 mL 1   insulin regular (NOVOLIN R) 100 units/mL injection Inject 0.12 mLs (12 Units total) into the skin 3 (three) times daily before meals. E.11.9. Hold dose for blood sugar less than 125 10 mL 11   letrozole (FEMARA) 2.5 MG tablet TAKE 1 TABLET BY MOUTH EVERY DAY 90 tablet 3   levofloxacin (LEVAQUIN) 500 MG tablet Take 1 tablet (500 mg total) by mouth daily. 7 tablet 0   loperamide (IMODIUM) 2 MG  capsule Take 2 mg by mouth as needed for diarrhea or loose stools. Take 2 tabs (4 mg) with first loose stool, then 1 tab (2 mg) with each additional loose stool. Do not take more than 8 tabs (16 mg) in a 24-hour period.     meclizine (ANTIVERT) 25 MG tablet Take 1 tablet (25 mg total) by mouth 3 (three) times daily as needed for dizziness. 30 tablet 0   metoprolol succinate (TOPROL-XL) 25 MG 24 hr tablet Take 1 tablet (25 mg total) by mouth daily. 90 tablet 3   metroNIDAZOLE (FLAGYL) 500 MG tablet Take 1 tablet (500 mg total) by mouth 3 (three) times daily. 21 tablet 0   Milk Thistle 1000 MG CAPS Take 1,000 mg by mouth daily as needed.     nitrofurantoin, macrocrystal-monohydrate, (MACROBID) 100 MG capsule Take 1  capsule (100 mg total) by mouth 2 (two) times daily. 10 capsule 0   ondansetron (ZOFRAN ODT) 8 MG disintegrating tablet Take 1 tablet (8 mg total) by mouth every 8 (eight) hours as needed for nausea or vomiting. 20 tablet 1   PEG-KCl-NaCl-NaSulf-Na Asc-C (PLENVU) 140 g SOLR Take 1 kit by mouth as directed. Use coupon: BIN: [id number] PNC: CNRX Group: [id number] ID: [id number] 1 each 0   rosuvastatin (CRESTOR) 40 MG tablet TAKE 1 TABLET BY MOUTH EVERY DAY 90 tablet 0   No current facility-administered medications for this encounter.    REVIEW OF SYSTEMS:  Notable for that above.   PHYSICAL EXAM:  vitals were not taken for this visit.   General: Alert and oriented, in no acute distress  Psychiatric: Judgment and insight are intact. Affect is appropriate. MSK: Independently ambulatory  ECOG = 1  0 - Asymptomatic (Fully active, able to carry on all predisease activities without restriction)  1 - Symptomatic but completely ambulatory (Restricted in physically strenuous activity but ambulatory and able to carry out work of a light or sedentary nature. For example, light housework, office work)  2 - Symptomatic, <50% in bed during the day (Ambulatory and capable of all self care but unable to carry out any work activities. Up and about more than 50% of waking hours)  3 - Symptomatic, >50% in bed, but not bedbound (Capable of only limited self-care, confined to bed or chair 50% or more of waking hours)  4 - Bedbound (Completely disabled. Cannot carry on any self-care. Totally confined to bed or chair)  5 - Death   Eustace Pen MM, Creech RH, Tormey DC, et al. [phone]). "Toxicity and response criteria of the [facility]". [place] Oncol. 5 (6): 649-55   LABORATORY DATA:  Lab Results  Component Value Date   WBC 4.0 10/06/2021   HGB 12.9 10/06/2021   HCT 37.6 10/06/2021   MCV 95.7 10/06/2021   PLT 319 10/06/2021   CMP     Component Value Date/Time   NA 141 10/06/2021  0921   NA 145 (H) 04/30/2021 1033   K 4.0 10/06/2021 0921   CL 110 10/06/2021 0921   CO2 24 10/06/2021 0921   GLUCOSE 172 (H) 10/06/2021 0921   BUN 16 10/06/2021 0921   BUN 8 04/30/2021 1033   CREATININE 0.90 10/06/2021 0921   CALCIUM 9.5 10/06/2021 0921   PROT 7.1 10/06/2021 0921   PROT 6.7 04/30/2021 1033   ALBUMIN 3.6 10/06/2021 0921   ALBUMIN 4.3 04/30/2021 1033   AST 20 10/06/2021 0921   ALT 18 10/06/2021 0921   ALKPHOS 106 10/06/2021 0921  BILITOT 0.5 10/06/2021 0921   GFRNONAA >60 10/06/2021 0921   GFRAA >60 11/02/2019 1231         RADIOGRAPHY: CT CHEST ABDOMEN PELVIS W CONTRAST  Result Date: 10/01/2021 CLINICAL DATA:  Patient with stage IV breast cancer on chemotherapy. Assess treatment response. EXAM: CT CHEST, ABDOMEN, AND PELVIS WITH CONTRAST TECHNIQUE: Multidetector CT imaging of the chest, abdomen and pelvis was performed following the standard protocol during bolus administration of intravenous contrast. RADIATION DOSE REDUCTION: This exam was performed according to the departmental dose-optimization program which includes automated exposure control, adjustment of the mA and/or kV according to patient size and/or use of iterative reconstruction technique. CONTRAST:  116m OMNIPAQUE IOHEXOL 300 MG/ML  SOLN COMPARISON:  Similar studies dated 06/16/2021 and 03/11/2021 FINDINGS: CT CHEST FINDINGS Cardiovascular: Normal cardiac size. No pericardial effusion. Calcification again LAD and right coronary artery with normal caliber and centrally clear pulmonary arteries and veins. Mild aortic atherosclerosis without aneurysm or dissection. Normal opacification of the great vessels. Mediastinum/Nodes: There are scattered shotty subcentimeter in short axis mediastinal and right hilar lymph nodes but these are unchanged. There are no enlarged intrathoracic or axillary nodes Lungs/Pleura: No pleural effusion, thickening or pneumothorax. Right lower lobe paraspinal scar-like opacities again  noted with bronchiectasis. The lungs are mildly emphysematous with centrilobular changes predominating in the upper lobes and apical paraseptal emphysematous change in addition. There are multiple stable tiny fissural nodules in the right lower lung field on axial images 68-78, slightly larger stable 5 mm fissural nodule again noted on 4:86. Stable 4 mm pleural-based left upper lobe nodule anteriorly on 4:68. No new, further or enlarging nodules are seen and no confluent infiltrate. Central airways are clear. Musculoskeletal: Again noted are multifocal sclerotic skeletal metastases primarily the spine and ribs, with again noted partially healed pathologic fractures in the posterolateral right fifth and sixth and posterior right fourth ribs, anterolateral left second rib and lateral left fourth rib. Additional osteolytic changes in the inferior body of left scapula are again also noted. There is old right mastectomy and small chronic fluid collection overlying anterior right chest wall at the level of mastectomy. CT ABDOMEN PELVIS FINDINGS Hepatobiliary: New hypodense rim enhancing likely metastasis noted anteriorly in the left hepatic lateral segment on 2:55. The liver is 19 cm length, mildly steatotic and otherwise homogeneously enhancing. Gallbladder and bile ducts are unremarkable. Pancreas: No focal abnormality. Spleen: No focal abnormality.  No splenomegaly. Adrenals/Urinary Tract: Unremarkable adrenal glands and renal cortex. No urinary stone or obstruction. Unremarkable bladder. Stomach/Bowel: Unremarkable stomach and small bowel. Again noted surgical absence of appendix. Moderate fecal stasis. Advanced sigmoid diverticulosis is seen with increased perisigmoid stranding along the mid to distal sigmoid consistent with acute diverticulitis. No diverticular abscess is seen. There is increased wall thickening in the diseased segment. Vascular/Lymphatic: Patchy aortoiliac atherosclerosis without aneurysm. Normal  portal vein. Engorged left gonadal vein is unchanged. No adenopathy is seen. Reproductive: Status post hysterectomy. No adnexal masses. Other: Small amount of free fluid posterior to the diseased sigmoid segment is nonlocalizing. There is no free air, abscess, free hemorrhage or incarcerated hernia. Musculoskeletal: Sclerotic metastases in the lumbar spine are noted with mixed sclerotic/lytic metastases in the bony pelvis with the latter showing progression in the interval. There is interval new cortical breakthrough involving a lytic lesion just above the left acetabulum with cortical dehiscence laterally and additional outer cortical breakthrough involving the lesion in the more posterior left ilium. Sclerotic metastasis of the right hemisacrum appear similar as well as  subcentimeter sclerotic lesions in the ischium and proximal femurs. IMPRESSION: 1. No evidence of progressive metastatic disease in the chest including in the visualized bony metastases. 2. COPD change with multiple tiny nodules, most of which are fissural, stable. 3. New 1.6 cm rim enhancing hypodensity anteriorly in the left hepatic lateral segment is most likely a metastasis, less likely a liver abscess. Rest of the liver enhances uniformly. 4. Interval development of findings consistent with mid to distal sigmoid diverticulitis with increased wall thickening. Colonoscopy follow-up recommended after treatment to exclude underlying lesion. 5. Constipation. 6. Progressive pelvic metastatic disease with cortical breakthrough involving 2 left iliac lesions. The breakthrough occurred along the outer cortex of both lesions with no extension into the pelvis proper. 7. These results will be called to the ordering physician or representative by the radiologist assistant, and communication documented in the PACS or [facility] dashboard. Electronically Signed   By: Telford Nab M.D.   On: 10/01/2021 00:14     I personally reviewed her images above.      IMPRESSION/PLAN:   On date of service, in total, I spent *** minutes on this encounter. Patient was seen in person.  __________________________________________   Eppie Gibson, MD  This document serves as a record of services personally performed by Eppie Gibson, MD. It was created on her behalf by Roney Mans, a trained medical scribe. The creation of this record is based on the scribe's personal observations and the provider's statements to them. This document has been checked and approved by the attending provider.

## 2021-10-06 NOTE — Assessment & Plan Note (Addendum)
Right breast irregular mass lower inner quadrant retroareolar 02/19/2015: 2.2 x 1.3 x 1.7 cm with architectural distortion and skin/nipple retraction, T2 N0 stage II a clinical stage, additional benign cysts largest 1.2 cm Right breast biopsy 02/20/2015 5:00: Invasive ductal carcinoma grade 2, perineural invasion present, ER 90%, PR 70%, HER-2 negative ratio 0.95, Ki-67 15%   Breast MRI 03/07/2015: Subareolar mass 2.2 x 2.4 x 2.5 cm extending into the retracted nipple, extending inferiorly and laterally over a distance of 4 cm, several level I right axillary lymph nodes with cortical thickening up to 7 mm with a maximum diameter of 15 mm  11/16/2018:Right mastectomy ([facility])performed because the tumor was fungating: IDC, grade 1, 5.2cm, grossly involving underlying skin, clear margins. Radiation to chest wall: 12/19/18- 01/30/19 Bone scan 06/26/2019: Stable distribution of bone metastases. CT CAP3/08/2020:Mixed lytic and sclerotic bone metastases several demonstrating increased lytic character concern for worsening bone mets. 05/16/2019:[facility] molecular testing: ER positive, PIK3CA mutation present, HER-2 negative, ER positive TMB low BRCA1 not detected, ESR 1 not detected, PD-L1 negative --------------------------------------------------------------------- Treatment summary:Anastrozole with Delton See started April 2020Ibrance added 09/29/2018, Faslodex 11/20/2020 discontinued January 2023 (due to progression and toxicity)  Current treatment: Verzinio with letrozole along with Xgeva started 04/11/2021  Verzinio toxicities: 1. Denies any diarrhea 2. Upper respiratory infection: I sent a prescription for azithromycin.  CT CAP 06/16/2021: Stable bone metastases.  Interstitial thickening and bronchiectasis right lower lobe CT CAP 10/01/2021: New 1.6 cm hypodensity in left hepatic segment.  Sigmoid diverticulitis progressive pelvic metastatic disease with cortical breakthrough into left iliac  lesions  Recommendation: Participation in [place] 1 clinical trial with Gedatolisib vs Xeloda versus Piqray  Radiation oncology consult regarding left iliac bone radiation.  She is interested in getting the 1 dose radiation protocol.  Return to clinic in 2 weeks to discuss the final plan.

## 2021-10-07 ENCOUNTER — Encounter: Payer: Self-pay | Admitting: Radiation Oncology

## 2021-10-07 ENCOUNTER — Ambulatory Visit
Admission: RE | Admit: 2021-10-07 | Discharge: 2021-10-07 | Disposition: A | Discharge status: Home / Self Care | Payer: Medicare Other | Source: Ambulatory Visit | Attending: Radiation Oncology | Admitting: Radiation Oncology

## 2021-10-07 VITALS — BP 124/84 | HR 86 | Temp 96.8°F | Resp 18 | Ht <= 58 in | Wt 158.5 lb

## 2021-10-07 DIAGNOSIS — K5730 Diverticulosis of large intestine without perforation or abscess without bleeding: Secondary | ICD-10-CM | POA: Diagnosis not present

## 2021-10-07 DIAGNOSIS — C7951 Secondary malignant neoplasm of bone: Secondary | ICD-10-CM | POA: Insufficient documentation

## 2021-10-07 DIAGNOSIS — Z87891 Personal history of nicotine dependence: Secondary | ICD-10-CM | POA: Insufficient documentation

## 2021-10-07 DIAGNOSIS — Z79899 Other long term (current) drug therapy: Secondary | ICD-10-CM | POA: Diagnosis not present

## 2021-10-07 DIAGNOSIS — Z79811 Long term (current) use of aromatase inhibitors: Secondary | ICD-10-CM | POA: Insufficient documentation

## 2021-10-07 DIAGNOSIS — Z170 Estrogen receptor positive status [ER+]: Secondary | ICD-10-CM | POA: Diagnosis not present

## 2021-10-07 DIAGNOSIS — Z8051 Family history of malignant neoplasm of kidney: Secondary | ICD-10-CM | POA: Insufficient documentation

## 2021-10-07 DIAGNOSIS — Z9011 Acquired absence of right breast and nipple: Secondary | ICD-10-CM | POA: Diagnosis not present

## 2021-10-07 DIAGNOSIS — Z794 Long term (current) use of insulin: Secondary | ICD-10-CM | POA: Diagnosis not present

## 2021-10-07 DIAGNOSIS — N3090 Cystitis, unspecified without hematuria: Secondary | ICD-10-CM | POA: Insufficient documentation

## 2021-10-07 DIAGNOSIS — J432 Centrilobular emphysema: Secondary | ICD-10-CM | POA: Diagnosis not present

## 2021-10-07 DIAGNOSIS — K5900 Constipation, unspecified: Secondary | ICD-10-CM | POA: Insufficient documentation

## 2021-10-07 DIAGNOSIS — C50311 Malignant neoplasm of lower-inner quadrant of right female breast: Secondary | ICD-10-CM | POA: Insufficient documentation

## 2021-10-07 DIAGNOSIS — M25561 Pain in right knee: Secondary | ICD-10-CM | POA: Insufficient documentation

## 2021-10-07 DIAGNOSIS — R232 Flushing: Secondary | ICD-10-CM | POA: Insufficient documentation

## 2021-10-08 ENCOUNTER — Telehealth: Payer: Self-pay | Admitting: Hematology and Oncology

## 2021-10-08 ENCOUNTER — Telehealth: Payer: Self-pay

## 2021-10-08 ENCOUNTER — Other Ambulatory Visit: Payer: Self-pay | Admitting: Family Medicine

## 2021-10-08 DIAGNOSIS — E785 Hyperlipidemia, unspecified: Secondary | ICD-10-CM

## 2021-10-08 NOTE — Telephone Encounter (Signed)
Rescheduled appointment per patient due to being out of town. Patient is aware of the changes mae to her upcoming appointment.

## 2021-10-08 NOTE — Telephone Encounter (Signed)
[place] 1: A Phase 3 Study Comparing Gedatolisib in Combination with Palbociclib and Fulvestrant to Standard-of-Care Therapies in Patients with HR-Positive, HER2-Negative Advanced Breast Cancer Previously Treated with CDK4/6 Inhibitor in Combination with Non-Steroidal Aromatase Inhibitor Therapy  Called patient to follow-up on above-listed study. Patient stated she had not yet had time to review the consent forms. Patient to call research once she has read documents. Follow-up with Dr Lindi Adie scheduled for 8/29; will meet with patient then if she has not contacted research beforehand.  Vickii Penna, RN, BSN, CPN Clinical Research Nurse I [phone]  10/08/2021 12:52 PM

## 2021-10-09 ENCOUNTER — Encounter: Payer: Self-pay | Admitting: Gastroenterology

## 2021-10-09 ENCOUNTER — Ambulatory Visit (INDEPENDENT_AMBULATORY_CARE_PROVIDER_SITE_OTHER): Payer: Medicare Other | Admitting: Gastroenterology

## 2021-10-09 VITALS — BP 102/70 | HR 104 | Ht <= 58 in | Wt 158.2 lb

## 2021-10-09 DIAGNOSIS — R1319 Other dysphagia: Secondary | ICD-10-CM | POA: Diagnosis not present

## 2021-10-09 DIAGNOSIS — R194 Change in bowel habit: Secondary | ICD-10-CM

## 2021-10-09 DIAGNOSIS — R933 Abnormal findings on diagnostic imaging of other parts of digestive tract: Secondary | ICD-10-CM

## 2021-10-09 NOTE — Progress Notes (Signed)
[facility] progress note:  History: Emily Wilson 10/09/2021  Referring provider: Dorna Mai, MD  Reason for consult/chief complaint: abdnormal CT   Subjective  HPI: Emily Wilson was seen 06/09/2021 in office consult for dysphagia and altered bowel habits in the setting of breast cancer metastatic to bones with prior radiation therapy and ongoing immunotherapy.  Initial plans were for an EGD and colonoscopy. Unfortunately, she suffered a fracture shortly afterward and canceled the procedures.  Emily Wilson says her crampy lower abdominal pain and intermittent diarrhea slowly worsened after I last saw her.  She denies rectal bleeding, says her appetite is generally been good and weight stable.  Oncology office note from 10/06/2021 by Dr. Lindi Adie was reviewed, and it notes that Emily Wilson was prescribed metronidazole 500 mg 3 times daily and Levaquin 500 mg once daily for the diverticulitis.  Emily Wilson has come to the conclusion that 1 or more of her cancer treatment medicines has been causing abdominal pain and diarrhea based on her observations of the symptoms and when these medicines were started and stopped.  ROS:  Review of Systems  Constitutional:  Negative for appetite change and unexpected weight change.  HENT:  Negative for mouth sores and voice change.   Eyes:  Negative for pain and redness.  Respiratory:  Negative for cough and shortness of breath.   Cardiovascular:  Negative for chest pain and palpitations.  Genitourinary:  Negative for dysuria and hematuria.  Musculoskeletal:  Negative for arthralgias and myalgias.  Skin:  Negative for pallor and rash.  Neurological:  Negative for weakness and headaches.  Hematological:  Negative for adenopathy.     Past Medical History: Past Medical History:  Diagnosis Date   Anxiety state, unspecified    Arthritis    Breast cancer of lower-inner quadrant of right female breast ([place])    Carpal tunnel syndrome     Complication of anesthesia    woke up during ganglion cyst removal in her 20's, not woken up since   Diabetes mellitus without complication (HCC)    Type II   Diverticulosis    Heart murmur    History of radiation therapy 12/18/18- 01/30/19   Right Chest wall 25 fractions X 2Gy each to total 50 Gy, followed by a boost 10 Gy in 5 fractions.    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, without mention of thyrotoxic crisis or storm      Past Surgical History: Past Surgical History:  Procedure Laterality Date   ABDOMINAL HYSTERECTOMY     ANTERIOR CRUCIATE LIGAMENT REPAIR Right 2007   ACL repair   APPENDECTOMY     CARPAL TUNNEL RELEASE Right    GANGLION CYST EXCISION Right    TOTAL MASTECTOMY Right 11/16/2018   Procedure: RIGHT MASTECTOMY;  Surgeon: Coralie Keens, MD;  Location: [place];  Service: General;  Laterality: Right;   TUBAL LIGATION       Family History: Family History  Problem Relation Age of Onset   Diabetes Mother    Bladder Cancer Mother 31       smoker   Other Mother 60       TAH for unspecified reason   Multiple sclerosis Mother    Kidney failure Mother    Cancer Father 57       lymphatic/tonsil cancer - in remission; former smoker  Diabetes Sister    Diabetes Sister    Heart attack Brother    COPD Maternal Grandmother        smoker   Emphysema Maternal Grandmother        smoker   Diabetes Maternal Grandmother    Stroke Maternal Grandfather    Diabetes Paternal Grandmother    Dementia Maternal Uncle    COPD Maternal Uncle        smoker   Multiple sclerosis Paternal Aunt    Breast cancer Paternal Aunt        dx. late 33s - early 51s    Social History: Social History   Socioeconomic History   Marital status: Divorced    Spouse name: Not on file   Number of children: 2   Years of education: Not on file   Highest education  level: Not on file  Occupational History   Occupation: Disabled  Tobacco Use   Smoking status: Former    Packs/day: 3.00    Years: 30.00    Total pack years: 90.00    Types: Cigarettes    Quit date: 10/24/2011    Years since quitting: 9.9   Smokeless tobacco: Never  Vaping Use   Vaping Use: Never used  Substance and Sexual Activity   Alcohol use: No   Drug use: Never   Sexual activity: Not Currently  Other Topics Concern   Not on file  Social History Narrative   In [place] w/boyfriend   Works Chiropractor, Scientist, water quality   Social Determinants of Health   Financial Resource Strain: Medium Risk (05/01/2021)   Overall Financial Resource Strain (CARDIA)    Difficulty of Paying Living Expenses: Somewhat hard  Food Insecurity: No Food Insecurity (05/01/2021)   Hunger Vital Sign    Worried About Running Out of Food in the Last Year: Never true    [place] in the Last Year: Never true  Transportation Needs: No Transportation Needs (05/01/2021)   PRAPARE - Hydrologist (Medical): No    Lack of Transportation (Non-Medical): No  Physical Activity: Inactive (05/01/2021)   Exercise Vital Sign    Days of Exercise per Week: 0 days    Minutes of Exercise per Session: 0 min  Stress: Stress Concern Present (05/01/2021)   [place]    Feeling of Stress : Very much  Social Connections: Socially Isolated (05/01/2021)   Social Connection and Isolation Panel [NHANES]    Frequency of Communication with Friends and Family: More than three times a week    Frequency of Social Gatherings with Friends and Family: More than three times a week    Attends Religious Services: Never    Marine scientist or Organizations: No    Attends Archivist Meetings: Never    Marital Status: Divorced    Allergies: Allergies  Allergen Reactions   Doxycycline Shortness Of Breath    Wheezing, shortness of breath,  rash head to toe, and swelling   Naproxen Shortness Of Breath   Oxycodone-Acetaminophen Itching   Codeine Hives   Hydrocodone-Acetaminophen Hives   Ibuprofen     Irritant to stomach r/t diverticulitis   Lantus [Insulin Glargine]     Yeast Infections   Meloxicam Other (See Comments)    Abdominal pain    Propoxyphene N-Acetaminophen Hives   Sulfa Antibiotics Hives   Sulfonamide Derivatives Hives   Tramadol Other (See Comments)    Abdominal Pain  Outpatient Meds: Current Outpatient Medications  Medication Sig Dispense Refill   abemaciclib (VERZENIO) 50 MG tablet Take 1 tablet (50 mg total) by mouth 2 (two) times daily. Swallow tablets whole. Do not chew, crush, or split tablets before swallowing. 56 tablet 3   Accu-Chek FastClix Lancets MISC USE TO TEST BLOOD SUGAR UP TO FOUR TIMES DAILY AS DIRECTED 204 each 2   BD INSULIN SYRINGE U/F 31G X 5/16" 0.5 ML MISC USE TO ADMINISTER INSULIN 3 TIMES DAILY WITH MEALS 100 each 3   bismuth subsalicylate (PEPTO BISMOL) 262 MG/15ML suspension Take 30 mLs by mouth every 6 (six) hours as needed.     denosumab (XGEVA) 120 MG/1.7ML SOLN injection Inject 120 mg into the skin every 3 (three) months.     diclofenac (VOLTAREN) 75 MG EC tablet TAKE 1 TABLET BY MOUTH 2 TIMES DAILY AS NEEDED. 60 tablet 2   dicyclomine (BENTYL) 20 MG tablet Take 1 tablet (20 mg total) by mouth every 6 (six) hours. 120 tablet 0   gabapentin (NEURONTIN) 100 MG capsule Take 3 capsules (300 mg total) by mouth 3 (three) times daily. 90 capsule 1   glucose blood (ACCU-CHEK GUIDE) test strip USE TO CHECK BLOOD SUGAR UP TO 4 TIMES A DAY (E11.65) 200 strip 2   HYDROmorphone (DILAUDID) 4 MG tablet Take 1 tablet (4 mg total) by mouth every 4 (four) hours as needed for severe pain. 10 tablet 0   insulin NPH Human (HUMULIN N) 100 UNIT/ML injection INJECT 12 UNITS INTO THE SKIN AT BEDTIME 10 mL 1   insulin regular (NOVOLIN R) 100 units/mL injection Inject 0.12 mLs (12 Units total) into the  skin 3 (three) times daily before meals. E.11.9. Hold dose for blood sugar less than 125 10 mL 11   letrozole (FEMARA) 2.5 MG tablet TAKE 1 TABLET BY MOUTH EVERY DAY 90 tablet 3   levofloxacin (LEVAQUIN) 500 MG tablet Take 1 tablet (500 mg total) by mouth daily. 7 tablet 0   loperamide (IMODIUM) 2 MG capsule Take 2 mg by mouth as needed for diarrhea or loose stools. Take 2 tabs (4 mg) with first loose stool, then 1 tab (2 mg) with each additional loose stool. Do not take more than 8 tabs (16 mg) in a 24-hour period.     meclizine (ANTIVERT) 25 MG tablet Take 1 tablet (25 mg total) by mouth 3 (three) times daily as needed for dizziness. 30 tablet 0   metoprolol succinate (TOPROL-XL) 25 MG 24 hr tablet Take 1 tablet (25 mg total) by mouth daily. 90 tablet 3   metroNIDAZOLE (FLAGYL) 500 MG tablet Take 1 tablet (500 mg total) by mouth 3 (three) times daily. 21 tablet 0   Milk Thistle 1000 MG CAPS Take 1,000 mg by mouth daily as needed.     ondansetron (ZOFRAN ODT) 8 MG disintegrating tablet Take 1 tablet (8 mg total) by mouth every 8 (eight) hours as needed for nausea or vomiting. 20 tablet 1   PEG-KCl-NaCl-NaSulf-Na Asc-C (PLENVU) 140 g SOLR Take 1 kit by mouth as directed. Use coupon: BIN: [id number] PNC: CNRX Group: [id number] ID: [id number] 1 each 0   rosuvastatin (CRESTOR) 40 MG tablet TAKE 1 TABLET BY MOUTH EVERY DAY 90 tablet 0   No current facility-administered medications for this visit.      ___________________________________________________________________ Objective   Exam:  BP 102/70   Pulse (!) 104   Ht _0  (1.473 m)   Wt 158 lb 4 oz (71.8 kg)  BMI 33.07 kg/m  Wt Readings from Last 3 Encounters:  10/09/21 158 lb 4 oz (71.8 kg)  10/07/21 158 lb 8 oz (71.9 kg)  10/06/21 158 lb 9.6 oz (71.9 kg)    General: Well-appearing Eyes: sclera anicteric, no redness ENT: oral mucosa moist without lesions, no cervical or supraclavicular lymphadenopathy CV: Regular without murmur, no  JVD, no peripheral edema Resp: clear to auscultation bilaterally, normal RR and effort noted GI: soft, no tenderness, with active bowel sounds. No guarding or palpable organomegaly noted. Skin; warm and dry, no rash or jaundice noted Neuro: awake, alert and oriented x 3. Normal gross motor function and fluent speech  Labs:     Latest Ref Rng & Units 10/06/2021    9:21 AM 07/14/2021    9:32 AM 06/22/2021    8:58 AM  CBC  WBC 4.0 - 10.5 K/uL 4.0  4.3  3.7   Hemoglobin 12.0 - 15.0 g/dL 12.9  13.9  13.6   Hematocrit 36.0 - 46.0 % 37.6  41.0  41.0   Platelets 150 - 400 K/uL 319  342  281       Latest Ref Rng & Units 10/06/2021    9:21 AM 09/30/2021   11:50 AM 07/14/2021    9:32 AM  CMP  Glucose 70 - 99 mg/dL 172   86   BUN 6 - 20 mg/dL 16   18   Creatinine 0.44 - 1.00 mg/dL 0.90  0.70  0.76   Sodium 135 - 145 mmol/L 141   139   Potassium 3.5 - 5.1 mmol/L 4.0   4.2   Chloride 98 - 111 mmol/L 110   107   CO2 22 - 32 mmol/L 24   27   Calcium 8.9 - 10.3 mg/dL 9.5   9.4   Total Protein 6.5 - 8.1 g/dL 7.1   7.3   Total Bilirubin 0.3 - 1.2 mg/dL 0.5   0.4   Alkaline Phos 38 - 126 U/L 106   107   AST 15 - 41 U/L 20   17   ALT 0 - 44 U/L 18   17      Radiologic Studies:  CLINICAL DATA:  Patient with stage IV breast cancer on chemotherapy. Assess treatment response.   EXAM: CT CHEST, ABDOMEN, AND PELVIS WITH CONTRAST   TECHNIQUE: Multidetector CT imaging of the chest, abdomen and pelvis was performed following the standard protocol during bolus administration of intravenous contrast.   RADIATION DOSE REDUCTION: This exam was performed according to the departmental dose-optimization program which includes automated exposure control, adjustment of the mA and/or kV according to patient size and/or use of iterative reconstruction technique.   CONTRAST:  14m OMNIPAQUE IOHEXOL 300 MG/ML  SOLN   COMPARISON:  Similar studies dated 06/16/2021 and 03/11/2021   FINDINGS: CT CHEST  FINDINGS   Cardiovascular: Normal cardiac size. No pericardial effusion. Calcification again LAD and right coronary artery with normal caliber and centrally clear pulmonary arteries and veins. Mild aortic atherosclerosis without aneurysm or dissection. Normal opacification of the great vessels.   Mediastinum/Nodes: There are scattered shotty subcentimeter in short axis mediastinal and right hilar lymph nodes but these are unchanged. There are no enlarged intrathoracic or axillary nodes   Lungs/Pleura: No pleural effusion, thickening or pneumothorax. Right lower lobe paraspinal scar-like opacities again noted with bronchiectasis.   The lungs are mildly emphysematous with centrilobular changes predominating in the upper lobes and apical paraseptal emphysematous change in addition.   There are multiple  stable tiny fissural nodules in the right lower lung field on axial images 68-78, slightly larger stable 5 mm fissural nodule again noted on 4:86. Stable 4 mm pleural-based left upper lobe nodule anteriorly on 4:68.   No new, further or enlarging nodules are seen and no confluent infiltrate. Central airways are clear.   Musculoskeletal: Again noted are multifocal sclerotic skeletal metastases primarily the spine and ribs, with again noted partially healed pathologic fractures in the posterolateral right fifth and sixth and posterior right fourth ribs, anterolateral left second rib and lateral left fourth rib.   Additional osteolytic changes in the inferior body of left scapula are again also noted. There is old right mastectomy and small chronic fluid collection overlying anterior right chest wall at the level of mastectomy.   CT ABDOMEN PELVIS FINDINGS   Hepatobiliary: New hypodense rim enhancing likely metastasis noted anteriorly in the left hepatic lateral segment on 2:55. The liver is 19 cm length, mildly steatotic and otherwise homogeneously enhancing. Gallbladder and bile  ducts are unremarkable.   Pancreas: No focal abnormality.   Spleen: No focal abnormality.  No splenomegaly.   Adrenals/Urinary Tract: Unremarkable adrenal glands and renal cortex. No urinary stone or obstruction. Unremarkable bladder.   Stomach/Bowel: Unremarkable stomach and small bowel. Again noted surgical absence of appendix. Moderate fecal stasis. Advanced sigmoid diverticulosis is seen with increased perisigmoid stranding along the mid to distal sigmoid consistent with acute diverticulitis. No diverticular abscess is seen. There is increased wall thickening in the diseased segment.   Vascular/Lymphatic: Patchy aortoiliac atherosclerosis without aneurysm. Normal portal vein. Engorged left gonadal vein is unchanged. No adenopathy is seen.   Reproductive: Status post hysterectomy. No adnexal masses.   Other: Small amount of free fluid posterior to the diseased sigmoid segment is nonlocalizing. There is no free air, abscess, free hemorrhage or incarcerated hernia.   Musculoskeletal: Sclerotic metastases in the lumbar spine are noted with mixed sclerotic/lytic metastases in the bony pelvis with the latter showing progression in the interval.   There is interval new cortical breakthrough involving a lytic lesion just above the left acetabulum with cortical dehiscence laterally and additional outer cortical breakthrough involving the lesion in the more posterior left ilium.   Sclerotic metastasis of the right hemisacrum appear similar as well as subcentimeter sclerotic lesions in the ischium and proximal femurs.   IMPRESSION: 1. No evidence of progressive metastatic disease in the chest including in the visualized bony metastases. 2. COPD change with multiple tiny nodules, most of which are fissural, stable. 3. New 1.6 cm rim enhancing hypodensity anteriorly in the left hepatic lateral segment is most likely a metastasis, less likely a liver abscess. Rest of the liver  enhances uniformly. 4. Interval development of findings consistent with mid to distal sigmoid diverticulitis with increased wall thickening. Colonoscopy follow-up recommended after treatment to exclude underlying lesion. 5. Constipation. 6. Progressive pelvic metastatic disease with cortical breakthrough involving 2 left iliac lesions. The breakthrough occurred along the outer cortex of both lesions with no extension into the pelvis proper. 7. These results will be called to the ordering physician or representative by the radiologist assistant, and communication documented in the PACS or [facility] dashboard.     Electronically Signed   By: Telford Nab M.D.   On: 10/01/2021 00:14  (Images personally reviewed - H. Danis)  Assessment: Encounter Diagnoses  Name Primary?   Esophageal dysphagia Yes   Altered bowel habits    Abnormal finding on GI tract imaging  She continues to have intermittent dysphagia with concerns for esophageal stricture given her history of prior radiation.  Regarding her abdominal pain and altered bowel habits, this still remains somewhat of a puzzle to me.  She had similar symptoms to a lesser degree when I saw her months ago, and a CT scan just before that did not show any apparent diverticulitis or colitis.  In addition, her normal WBC and lack of tenderness on exam now make me wonder if this is truly diverticulitis.  Given her metastatic disease progression on imaging (both bones and a new liver lesion), I am concerned there could be metastasis within or adjacent to the left colon.  Some immunotherapy agents may also cause an IBD like colitis or ileitis, so I will have to do some more research into whether her particular agents are known to do so.  I will also communicate with her oncologist about this.  At this point, she needs a full treatment course for presumed diverticulitis, at which point I want to hear from her with whether or not she feels any better  from the standpoint of abdominal pain or bowel habits.  If not, she needs repeat abdominal imaging in conjunction with oncology's plans for pelvic radiation therapy and any other imaging that may be needed for that. This is essential before we consider doing a colonoscopy, as the risk of colon perforation would be increased in the setting of suspected diverticulitis.  Therefore, we will also hold off on the upper endoscopy until we know whether or not a colonoscopy is necessary.  If so, they would then be done the same day.   35 minutes were spent on this encounter (including chart review, history/exam, counseling/coordination of care, and documentation) > 50% of that time was spent on counseling and coordination of care.   Nelida Meuse III  CC: Referring provider noted above

## 2021-10-09 NOTE — Patient Instructions (Signed)
_______________________________________________________  If you are age 56 or older, your body mass index should be between 23-30. Your Body mass index is 33.07 kg/m. If this is out of the aforementioned range listed, please consider follow up with your Primary Care Provider.  If you are age 37 or younger, your body mass index should be between 19-25. Your Body mass index is 33.07 kg/m. If this is out of the aformentioned range listed, please consider follow up with your Primary Care Provider.   ________________________________________________________  The [facility] providers would like to encourage you to use [facility] to communicate with providers for non-urgent requests or questions.  Due to long hold times on the telephone, sending your provider a message by [facility] may be a faster and more efficient way to get a response.  Please allow 48 business hours for a response.  Please remember that this is for non-urgent requests.  _______________________________________________________  It was a pleasure to see you today!  Thank you for trusting me with your gastrointestinal care!

## 2021-10-12 DIAGNOSIS — C7951 Secondary malignant neoplasm of bone: Secondary | ICD-10-CM | POA: Diagnosis not present

## 2021-10-19 ENCOUNTER — Telehealth: Payer: Self-pay

## 2021-10-19 NOTE — Telephone Encounter (Signed)
[place] 1: A Phase 3 Study Comparing Gedatolisib in Combination with Palbociclib and Fulvestrant to Standard-of-Care Therapies in Patients with HR-Positive, HER2-Negative Advanced Breast Cancer Previously Treated with CDK4/6 Inhibitor in Combination with Non-Steroidal Aromatase Inhibitor Therapy  Called and LVM to follow-up on above-listed study. Patient has follow-up with Dr Lindi Adie scheduled for 9/13; will meet with patient then if she has not contacted research beforehand.  Vickii Penna, RN, BSN, CPN Clinical Research Nurse I [phone]  10/19/2021 11:55 AM

## 2021-10-20 ENCOUNTER — Ambulatory Visit: Payer: Medicare Other | Admitting: Hematology and Oncology

## 2021-10-27 ENCOUNTER — Ambulatory Visit
Admission: RE | Admit: 2021-10-27 | Discharge: 2021-10-27 | Disposition: A | Discharge status: Home / Self Care | Payer: Medicare Other | Source: Ambulatory Visit | Attending: Radiation Oncology | Admitting: Radiation Oncology

## 2021-10-27 ENCOUNTER — Other Ambulatory Visit (HOSPITAL_COMMUNITY): Payer: Self-pay

## 2021-10-27 ENCOUNTER — Other Ambulatory Visit: Payer: Self-pay | Admitting: Internal Medicine

## 2021-10-27 ENCOUNTER — Other Ambulatory Visit: Payer: Self-pay

## 2021-10-27 ENCOUNTER — Ambulatory Visit: Payer: Medicare Other

## 2021-10-27 DIAGNOSIS — C7951 Secondary malignant neoplasm of bone: Secondary | ICD-10-CM | POA: Diagnosis present

## 2021-10-27 DIAGNOSIS — Z170 Estrogen receptor positive status [ER+]: Secondary | ICD-10-CM | POA: Insufficient documentation

## 2021-10-27 DIAGNOSIS — C50311 Malignant neoplasm of lower-inner quadrant of right female breast: Secondary | ICD-10-CM | POA: Diagnosis not present

## 2021-10-27 DIAGNOSIS — E119 Type 2 diabetes mellitus without complications: Secondary | ICD-10-CM

## 2021-10-27 LAB — RAD ONC ARIA SESSION SUMMARY
Course Elapsed Days: 0
Plan Fractions Treated to Date: 1
Plan Prescribed Dose Per Fraction: 4 Gy
Plan Total Fractions Prescribed: 5
Plan Total Prescribed Dose: 20 Gy
Reference Point Dosage Given to Date: 4 Gy
Reference Point Session Dosage Given: 4 Gy
Session Number: 1

## 2021-10-28 ENCOUNTER — Other Ambulatory Visit: Payer: Self-pay

## 2021-10-28 ENCOUNTER — Other Ambulatory Visit (HOSPITAL_COMMUNITY): Payer: Self-pay

## 2021-10-28 ENCOUNTER — Telehealth: Payer: Self-pay

## 2021-10-28 ENCOUNTER — Ambulatory Visit
Admission: RE | Admit: 2021-10-28 | Discharge: 2021-10-28 | Disposition: A | Discharge status: Home / Self Care | Payer: Medicare Other | Source: Ambulatory Visit | Attending: Radiation Oncology | Admitting: Radiation Oncology

## 2021-10-28 ENCOUNTER — Other Ambulatory Visit: Payer: Self-pay | Admitting: Radiation Oncology

## 2021-10-28 DIAGNOSIS — C7951 Secondary malignant neoplasm of bone: Secondary | ICD-10-CM | POA: Diagnosis not present

## 2021-10-28 LAB — RAD ONC ARIA SESSION SUMMARY
Course Elapsed Days: 1
Plan Fractions Treated to Date: 2
Plan Prescribed Dose Per Fraction: 4 Gy
Plan Total Fractions Prescribed: 5
Plan Total Prescribed Dose: 20 Gy
Reference Point Dosage Given to Date: 8 Gy
Reference Point Session Dosage Given: 4 Gy
Session Number: 2

## 2021-10-28 MED ORDER — HYDROMORPHONE HCL 4 MG PO TABS: 4.0000 mg | ORAL_TABLET | Freq: Four times a day (QID) | ORAL | 0 refills | Status: DC | PRN

## 2021-10-28 NOTE — Telephone Encounter (Signed)
Called and spoke with patient to let her know that both Dr. Isidore Moos and Dr. Lindi Adie advised trying non-narcotic pain medication as well as non-pharmacologic interventions (like massages) first before prescribing opioid pain medication. Patient stated she cannot take OTC pain medication because of allergies or GI ulcers. She declined massage therapy as well because she is adamant her pain is "bone on bone". Informed her I would relay her concerns to Dr. Isidore Moos and call her with an update.   Passed along patient's concerns to Dr. Isidore Moos who agreed to write a prescription for a short course of PO dilaudid as well as place a referral to Palliative Care for long term symptom management. Called and spoke with patient's pharmacy to confirm that prescription was received and would be able to be filled tonight.   Called patient but she did not answer. Left detailed message letting her know that Dr. Isidore Moos sent in pain medication prescription and placed Palliative Care referral. Encouraged patient to call back should she have any other questions/concerns, and reminded her that she will see Dr. Isidore Moos once more on Monday after her final radiation appointment

## 2021-10-29 ENCOUNTER — Other Ambulatory Visit: Payer: Self-pay

## 2021-10-29 ENCOUNTER — Ambulatory Visit
Admission: RE | Admit: 2021-10-29 | Discharge: 2021-10-29 | Disposition: A | Discharge status: Home / Self Care | Payer: Medicare Other | Source: Ambulatory Visit | Attending: Radiation Oncology | Admitting: Radiation Oncology

## 2021-10-29 DIAGNOSIS — C7951 Secondary malignant neoplasm of bone: Secondary | ICD-10-CM | POA: Diagnosis not present

## 2021-10-29 LAB — RAD ONC ARIA SESSION SUMMARY
Course Elapsed Days: 2
Plan Fractions Treated to Date: 3
Plan Prescribed Dose Per Fraction: 4 Gy
Plan Total Fractions Prescribed: 5
Plan Total Prescribed Dose: 20 Gy
Reference Point Dosage Given to Date: 12 Gy
Reference Point Session Dosage Given: 4 Gy
Session Number: 3

## 2021-10-30 ENCOUNTER — Ambulatory Visit
Admission: RE | Admit: 2021-10-30 | Discharge: 2021-10-30 | Disposition: A | Discharge status: Home / Self Care | Payer: Medicare Other | Source: Ambulatory Visit | Attending: Radiation Oncology | Admitting: Radiation Oncology

## 2021-10-30 ENCOUNTER — Other Ambulatory Visit (HOSPITAL_COMMUNITY): Payer: Self-pay

## 2021-10-30 ENCOUNTER — Other Ambulatory Visit: Payer: Self-pay

## 2021-10-30 DIAGNOSIS — C7951 Secondary malignant neoplasm of bone: Secondary | ICD-10-CM | POA: Diagnosis not present

## 2021-10-30 LAB — RAD ONC ARIA SESSION SUMMARY
Course Elapsed Days: 3
Plan Fractions Treated to Date: 4
Plan Prescribed Dose Per Fraction: 4 Gy
Plan Total Fractions Prescribed: 5
Plan Total Prescribed Dose: 20 Gy
Reference Point Dosage Given to Date: 16 Gy
Reference Point Session Dosage Given: 4 Gy
Session Number: 4

## 2021-10-31 ENCOUNTER — Other Ambulatory Visit: Payer: Self-pay | Admitting: Family Medicine

## 2021-10-31 DIAGNOSIS — E1159 Type 2 diabetes mellitus with other circulatory complications: Secondary | ICD-10-CM

## 2021-11-01 NOTE — Progress Notes (Signed)
Patient Care Team: Dorna Mai, MD as PCP - General (Family Medicine) Eppie Gibson, MD as Attending Physician (Radiation Oncology) [name], MD as Consulting Physician (Hematology and Oncology) Loletha Carrow Kirke Corin, MD as Consulting Physician (Gastroenterology) Delice Bison Charlestine Massed, NP as Nurse Practitioner (Hematology and Oncology) [name], RPH-CPP (Pharmacist)  DIAGNOSIS: No diagnosis found.  SUMMARY OF ONCOLOGIC HISTORY: Oncology History  Malignant neoplasm of lower-inner quadrant of right breast of female, estrogen receptor positive ([place])  02/19/2015 Mammogram   Right breast irregular mass lower inner quadrant 2.2 x 1.3 x 1.7 cm with architectural distortion and skin/nipple retraction, T2 N0 stage II a clinical stage, additional benign cysts largest 1.2 cm   02/20/2015 Initial Diagnosis   Right breast biopsy 5:00: Invasive ductal carcinoma grade 2, perineural invasion present, ER 90%, PR 70%, HER-2 negative ratio 0.95, Ki-67 15%    03/07/2015 Breast MRI   right breast inferior subareolar mass measuring 2.2 x 2.4 x 2.5 cm with skin and nipple enhancement, extending inferiorly and laterally is intraductal enhancement over a distance of 4 cm, several level I right axillary lymph nodes with cortical thickening   03/13/2015 -  Anti-estrogen oral therapy   Neoadjuvant antiestrogen therapy with tamoxifen 20 mg daily (because the patient did not want to undergo surgery immediately for multiple family reasons) stopped in late 2017; anastrozole started 05/11/18   05/23/2018 Imaging   CT CAP: 2.9 cm spiculated soft tissue density central right breast, no evidence of soft tissue metastatic disease within the chest abdomen pelvis.  Subcentimeter sclerotic bone lesions T10, left pubis, right posterior ilium Bone scan: Foci of uptake left frontal calvarium, T-spine, right ilium   10/04/2018 - 02/2021 Anti-estrogen oral therapy    Anastrozole with Delton See started April 2020 Ibrance  added 09/29/2018, Faslodex 11/20/2020 discontinued January 2023 (due to progression and toxicity)   11/16/2018 Surgery   Right mastectomy Ninfa Linden): IDC, grade 1, 5.2cm, grossly involving underlying skin, clear margins.   12/07/2018 Cancer Staging   Staging form: Breast, AJCC 7th Edition - Pathologic stage from 12/07/2018: Stage IV (yT4b, NX, M1) - Signed by Eppie Gibson, MD on 12/07/2018   12/18/2018 - 01/30/2019 Radiation Therapy   12/18/2018 through 01/30/2019 Site Technique Total Dose (Gy) Dose per Fx (Gy) Completed Fx Beam Energies  Chest Wall, Right: CW_Rt 3D 50/50 2 25/25 10X, 6X  Chest Wall, Right: CW_Rt_SCV_PAB 3D 50/50 2 25/25 6X, 10X  Chest Wall, Right: CW_Rt_Bst Electron 10/10 2 5/5 6E    05/16/2019 Miscellaneous   [facility] molecular testing: ER positive, PI K3 CA mutation present, HER-2 negative, ER positive TMB low BRCA1 not detected, ESR 1 not detected, PD-L1 negative   11/19/2020 - 12/02/2020 Radiation Therapy   11/19/2020 through 12/02/2020 Site Technique Total Dose (Gy) Dose per Fx (Gy) Completed Fx Beam Energies  Thoracic Spine: Spine_ T8-T11 3D 30/30 3 10/10 10X, 15X  Femur Left: Ext_Lt_femur Complex 30/30 3 10/10 10X  Lumbar Spine: Spine_L4-S4 3D 30/30 3 10/10 10X, 15X    04/11/2021 Treatment Plan Change   Verzinio with letrozole along with Xgeva started 04/11/2021   06/16/2021 Imaging   CT chest/abd/pelivs w/contrast  IMPRESSION: 1. No evidence of breast cancer carcinoma progression. 2. New linear interstitial thickening and bronchiectasis in the medial RIGHT lower lobe. Query radiation treatment or aspiration. 3. Multifocal skeletal metastasis involving the pelvis, spine, ribs and shoulder girdles. No interval change.     CHIEF COMPLIANT: Follow-up metastatic breast cancer and discuss the final paln    INTERVAL HISTORY: Emily Wilson is a 56 y.o. with above-mentioned history of metastatic breast cancer currently on anastrozole and Ibrance. She presents to the  clinic today for a follow-up.    ALLERGIES:  is allergic to doxycycline, naproxen, oxycodone-acetaminophen, codeine, hydrocodone-acetaminophen, ibuprofen, lantus [insulin glargine], meloxicam, propoxyphene n-acetaminophen, sulfa antibiotics, sulfonamide derivatives, and tramadol.  MEDICATIONS:  Current Outpatient Medications  Medication Sig Dispense Refill   abemaciclib (VERZENIO) 50 MG tablet Take 1 tablet (50 mg total) by mouth 2 (two) times daily. Swallow tablets whole. Do not chew, crush, or split tablets before swallowing. 56 tablet 3   Accu-Chek FastClix Lancets MISC USE TO TEST BLOOD SUGAR UP TO FOUR TIMES DAILY AS DIRECTED 102 each 5   BD INSULIN SYRINGE U/F 31G X 5/16" 0.5 ML MISC USE TO ADMINISTER INSULIN 3 TIMES DAILY WITH MEALS 100 each 3   bismuth subsalicylate (PEPTO BISMOL) 262 MG/15ML suspension Take 30 mLs by mouth every 6 (six) hours as needed.     denosumab (XGEVA) 120 MG/1.7ML SOLN injection Inject 120 mg into the skin every 3 (three) months.     diclofenac (VOLTAREN) 75 MG EC tablet TAKE 1 TABLET BY MOUTH 2 TIMES DAILY AS NEEDED. 60 tablet 2   dicyclomine (BENTYL) 20 MG tablet Take 1 tablet (20 mg total) by mouth every 6 (six) hours. 120 tablet 0   gabapentin (NEURONTIN) 100 MG capsule Take 3 capsules (300 mg total) by mouth 3 (three) times daily. 90 capsule 1   glucose blood (ACCU-CHEK GUIDE) test strip USE TO CHECK BLOOD SUGAR UP TO 4 TIMES A DAY (E11.65) 200 strip 2   HYDROmorphone (DILAUDID) 4 MG tablet Take 1 tablet (4 mg total) by mouth 4 (four) times daily as needed for severe pain. 20 tablet 0   insulin NPH Human (HUMULIN N) 100 UNIT/ML injection INJECT 12 UNITS INTO THE SKIN AT BEDTIME 10 mL 1   insulin regular (NOVOLIN R) 100 units/mL injection Inject 0.12 mLs (12 Units total) into the skin 3 (three) times daily before meals. E.11.9. Hold dose for blood sugar less than 125 10 mL 11   letrozole (FEMARA) 2.5 MG tablet TAKE 1 TABLET BY MOUTH EVERY DAY 90 tablet 3    levofloxacin (LEVAQUIN) 500 MG tablet Take 1 tablet (500 mg total) by mouth daily. 7 tablet 0   loperamide (IMODIUM) 2 MG capsule Take 2 mg by mouth as needed for diarrhea or loose stools. Take 2 tabs (4 mg) with first loose stool, then 1 tab (2 mg) with each additional loose stool. Do not take more than 8 tabs (16 mg) in a 24-hour period.     meclizine (ANTIVERT) 25 MG tablet Take 1 tablet (25 mg total) by mouth 3 (three) times daily as needed for dizziness. 30 tablet 0   metoprolol succinate (TOPROL-XL) 25 MG 24 hr tablet Take 1 tablet (25 mg total) by mouth daily. 90 tablet 3   metroNIDAZOLE (FLAGYL) 500 MG tablet Take 1 tablet (500 mg total) by mouth 3 (three) times daily. 21 tablet 0   Milk Thistle 1000 MG CAPS Take 1,000 mg by mouth daily as needed.     ondansetron (ZOFRAN ODT) 8 MG disintegrating tablet Take 1 tablet (8 mg total) by mouth every 8 (eight) hours as needed for nausea or vomiting. 20 tablet 1   PEG-KCl-NaCl-NaSulf-Na Asc-C (PLENVU) 140 g SOLR Take 1 kit by mouth as directed. Use coupon: BIN: [id number] PNC: CNRX Group: [id number] ID: [id number] 1 each 0   rosuvastatin (CRESTOR) 40 MG tablet  TAKE 1 TABLET BY MOUTH EVERY DAY 90 tablet 0   No current facility-administered medications for this visit.    PHYSICAL EXAMINATION: ECOG PERFORMANCE STATUS: {CHL ONC ECOG [phone]}  There were no vitals filed for this visit. There were no vitals filed for this visit.  BREAST:*** No palpable masses or nodules in either right or left breasts. No palpable axillary supraclavicular or infraclavicular adenopathy no breast tenderness or nipple discharge. (exam performed in the presence of a chaperone)  LABORATORY DATA:  I have reviewed the data as listed    Latest Ref Rng & Units 10/06/2021    9:21 AM 09/30/2021   11:50 AM 07/14/2021    9:32 AM  CMP  Glucose 70 - 99 mg/dL 172   86   BUN 6 - 20 mg/dL 16   18   Creatinine 0.44 - 1.00 mg/dL 0.90  0.70  0.76   Sodium 135 - 145 mmol/L 141    139   Potassium 3.5 - 5.1 mmol/L 4.0   4.2   Chloride 98 - 111 mmol/L 110   107   CO2 22 - 32 mmol/L 24   27   Calcium 8.9 - 10.3 mg/dL 9.5   9.4   Total Protein 6.5 - 8.1 g/dL 7.1   7.3   Total Bilirubin 0.3 - 1.2 mg/dL 0.5   0.4   Alkaline Phos 38 - 126 U/L 106   107   AST 15 - 41 U/L 20   17   ALT 0 - 44 U/L 18   17     Lab Results  Component Value Date   WBC 4.0 10/06/2021   HGB 12.9 10/06/2021   HCT 37.6 10/06/2021   MCV 95.7 10/06/2021   PLT 319 10/06/2021   NEUTROABS 2.5 10/06/2021    ASSESSMENT & PLAN:  No problem-specific Assessment & Plan notes found for this encounter.    No orders of the defined types were placed in this encounter.  The patient has a good understanding of the overall plan. she agrees with it. she will call with any problems that may develop before the next visit here. Total time spent: 30 mins including face to face time and time spent for planning, charting and co-ordination of care   Suzzette Righter, [place] 11/01/21    I Gardiner Coins am scribing for Dr. Lindi Adie  ***

## 2021-11-02 ENCOUNTER — Other Ambulatory Visit (HOSPITAL_COMMUNITY): Payer: Self-pay

## 2021-11-02 ENCOUNTER — Encounter: Payer: Self-pay | Admitting: Radiation Oncology

## 2021-11-02 ENCOUNTER — Other Ambulatory Visit: Payer: Self-pay | Admitting: Family Medicine

## 2021-11-02 ENCOUNTER — Ambulatory Visit: Payer: Medicare Other

## 2021-11-02 ENCOUNTER — Telehealth: Payer: Self-pay | Admitting: *Deleted

## 2021-11-02 ENCOUNTER — Other Ambulatory Visit: Payer: Self-pay

## 2021-11-02 ENCOUNTER — Other Ambulatory Visit: Payer: Self-pay | Admitting: Oncology

## 2021-11-02 ENCOUNTER — Ambulatory Visit
Admission: RE | Admit: 2021-11-02 | Discharge: 2021-11-02 | Disposition: A | Discharge status: Home / Self Care | Payer: Medicare Other | Source: Ambulatory Visit | Attending: Radiation Oncology | Admitting: Radiation Oncology

## 2021-11-02 DIAGNOSIS — C7951 Secondary malignant neoplasm of bone: Secondary | ICD-10-CM

## 2021-11-02 DIAGNOSIS — E119 Type 2 diabetes mellitus without complications: Secondary | ICD-10-CM

## 2021-11-02 LAB — RAD ONC ARIA SESSION SUMMARY
Course Elapsed Days: 6
Plan Fractions Treated to Date: 5
Plan Prescribed Dose Per Fraction: 4 Gy
Plan Total Fractions Prescribed: 5
Plan Total Prescribed Dose: 20 Gy
Reference Point Dosage Given to Date: 20 Gy
Reference Point Session Dosage Given: 4 Gy
Session Number: 5

## 2021-11-02 MED ORDER — ACCU-CHEK FASTCLIX LANCETS MISC: 5 refills | Status: AC

## 2021-11-02 NOTE — Telephone Encounter (Signed)
Requested Prescriptions  Pending Prescriptions Disp Refills  . Accu-Chek FastClix Lancets MISC 102 each 5    Sig: USE TO TEST BLOOD SUGAR UP TO FOUR TIMES DAILY AS DIRECTED     Endocrinology: Diabetes - Testing Supplies Passed - 11/02/2021  3:29 PM      Passed - Valid encounter within last 12 months    Recent Outpatient Visits          1 month ago Dysuria   Primary Care at [facility], [name], MD   2 months ago Type 2 diabetes mellitus with other circulatory complication, with long-term current use of insulin [facility])   [facility]   5 months ago Dysphagia, unspecified type   Primary Care at [facility], MD   9 months ago Essential hypertension   Primary Care at [facility], [name], MD   11 months ago Type 2 diabetes mellitus with other circulatory complication, with long-term current use of insulin [facility])   [facility] at [facility], [place]      Future Appointments            In 1 month Dorna Mai, MD Primary Care at [facility]

## 2021-11-02 NOTE — Progress Notes (Signed)
Referral placed for palliative care per Dr. Isidore Moos.

## 2021-11-02 NOTE — Telephone Encounter (Signed)
CALLED Emily Wilson'S OFFICE TO ARRANGE APPT. FOR HER TO BE SEEN IN THE NEXT WEEK OR SO, SPOKE WITH MEG AND SHE WILL GET THIS ARRANGED

## 2021-11-02 NOTE — Telephone Encounter (Signed)
Medication Refill - Medication: Accu-Chek FastClix Lancets misc Transmission failed 09/06 Has the patient contacted their pharmacy? yes (Agent: If no, request that the patient contact the pharmacy for the refill. If patient does not wish to contact the pharmacy document the reason why and proceed with request.) (Agent: If yes, when and what did the pharmacy advise?)contact pcp  Preferred Pharmacy (with phone number or street name):  [id number] [place], [place]:  [phone] Fax:  [phone]    Has the patient been seen for an appointment in the last year OR does the patient have an upcoming appointment? yes  Agent: Please be advised that RX refills may take up to 3 business days. We ask that you follow-up with your pharmacy.

## 2021-11-04 ENCOUNTER — Telehealth: Payer: Self-pay | Admitting: Nurse Practitioner

## 2021-11-04 ENCOUNTER — Other Ambulatory Visit: Payer: Self-pay

## 2021-11-04 ENCOUNTER — Inpatient Hospital Stay: Payer: Medicare Other | Attending: Hematology and Oncology | Admitting: Hematology and Oncology

## 2021-11-04 ENCOUNTER — Telehealth: Payer: Self-pay | Admitting: Pharmacy Technician

## 2021-11-04 ENCOUNTER — Encounter: Payer: Self-pay | Admitting: Hematology and Oncology

## 2021-11-04 ENCOUNTER — Telehealth: Payer: Self-pay

## 2021-11-04 ENCOUNTER — Other Ambulatory Visit (HOSPITAL_COMMUNITY): Payer: Self-pay

## 2021-11-04 DIAGNOSIS — Z79899 Other long term (current) drug therapy: Secondary | ICD-10-CM | POA: Insufficient documentation

## 2021-11-04 DIAGNOSIS — Z170 Estrogen receptor positive status [ER+]: Secondary | ICD-10-CM | POA: Insufficient documentation

## 2021-11-04 DIAGNOSIS — C50311 Malignant neoplasm of lower-inner quadrant of right female breast: Secondary | ICD-10-CM | POA: Diagnosis present

## 2021-11-04 DIAGNOSIS — Z9011 Acquired absence of right breast and nipple: Secondary | ICD-10-CM | POA: Diagnosis not present

## 2021-11-04 DIAGNOSIS — Z923 Personal history of irradiation: Secondary | ICD-10-CM | POA: Diagnosis not present

## 2021-11-04 DIAGNOSIS — C7951 Secondary malignant neoplasm of bone: Secondary | ICD-10-CM | POA: Diagnosis present

## 2021-11-04 MED ORDER — PIQRAY (200 MG DAILY DOSE) 200 MG PO TBPK: 200.0000 mg | ORAL_TABLET | Freq: Every day | ORAL | 0 refills | Status: DC

## 2021-11-04 MED ORDER — PIQRAY (200 MG DAILY DOSE) 200 MG PO TBPK
200.0000 mg | ORAL_TABLET | Freq: Every day | ORAL | 0 refills | Status: DC
  Filled 2021-11-04 – 2021-11-05 (×2): qty 28, 28d supply, fill #0

## 2021-11-04 NOTE — Telephone Encounter (Signed)
Scheduled per 9/13 secure chat w provider, message has been left with pt

## 2021-11-04 NOTE — Telephone Encounter (Signed)
Oral Oncology Patient Advocate Encounter   Received notification that prior authorization for Piqray is required.   PA submitted on 11/04/2021 Key [id number]  Status is pending     Emily Wilson, CPhT-Adv Oncology Pharmacy Patient [place] Direct Number: [phone]  Fax: [phone]

## 2021-11-04 NOTE — Assessment & Plan Note (Addendum)
Right breast irregular mass lower inner quadrant retroareolar 02/19/2015: 2.2 x 1.3 x 1.7 cm with architectural distortion and skin/nipple retraction, T2 N0 stage II a clinical stage, additional benign cysts largest 1.2 cm Right breast biopsy 02/20/2015 5:00: Invasive ductal carcinoma grade 2, perineural invasion present, ER 90%, PR 70%, HER-2 negative ratio 0.95, Ki-67 15%   Breast MRI 03/07/2015: Subareolar mass 2.2 x 2.4 x 2.5 cm extending into the retracted nipple, extending inferiorly and laterally over a distance of 4 cm, several level I right axillary lymph nodes with cortical thickening up to 7 mm with a maximum diameter of 15 mm  11/16/2018:Right mastectomy ([place])performed because the tumor was fungating: IDC, grade 1, 5.2cm, grossly involving underlying skin, clear margins. Radiation to chest wall: 12/19/18- 01/30/19 Bone scan 06/26/2019: Stable distribution of bone metastases. CT CAP3/08/2020:Mixed lytic and sclerotic bone metastases several demonstrating increased lytic character concern for worsening bone mets. 05/16/2019:[facility] molecular testing: ER positive,PIK3CA mutation present, HER-2 negative, ER positive TMB low BRCA1 not detected, ESR 1 not detected, PD-L1 negative --------------------------------------------------------------------- Treatment summary:Anastrozole with Delton See started April 2020Ibrance added 09/29/2018, Faslodex 11/20/2020 discontinued January 2023 (due to progression and toxicity)  Current treatment: Piqray with Faslodex to start 11/04/2021  Alpelisib ([place]) Counseling: Alpelisib was studied in [place] clinical trial: 569 patients postmenopausal woman who progressed on hormonal therapy were randomized to Fulvestrant + Alpelisib vs Fulvestrant. PFS was 11 months versus 5.7 months in patients who had PIK3CA mutation. Common side effects of Piqray are high blood sugar levels, increase in creatinine, diarrhea, rash, decrease in lymphocyte count, elevated liver  enzymes, nausea, fatigue, anemia, increase in lipase, decreased appetite, stomatitis, vomiting, weight loss, low calcium levels, aPTT prolonged, and hair loss.     CT CAP 06/16/2021: Stable bone metastases. Interstitial thickening and bronchiectasis right lower lobe CT CAP 10/01/2021: New 1.6 cm hypodensity in left hepatic segment.  Sigmoid diverticulitis progressive pelvic metastatic disease with cortical breakthrough into left iliac lesions Palliative radiation: 10/28/2021-11/02/2021  Return to clinic in 3 weeks for follow-up with Jenny Reichmann our pharmacy provider.  We will alternate seeing her for follow-ups.  I sent the prescription for 200 mg a day for the first month.  We can increase it to 300 mg a day from second month onwards if she tolerates it well.  I sent a message to her primary care physician to closely monitor her sugars.

## 2021-11-05 ENCOUNTER — Other Ambulatory Visit (HOSPITAL_COMMUNITY): Payer: Self-pay

## 2021-11-05 NOTE — Telephone Encounter (Signed)
Oral Oncology Pharmacist Encounter  Received new prescription for alpelisib [facility]) for the treatment of metastatic breast cancer with PIK3CA mutation present in conjunction with faslodex, planned duration until disease progression or unacceptable toxicity.  Labs from 10/06/2021 assessed, no interventions needed. Patient is currently on insulin. Prescription dose and frequency assessed.  Current medication list in Epic reviewed, no DDIs with Piqray identified.  Evaluated chart and no patient barriers to medication adherence noted.   Patient agreement for treatment documented in MD note on 11/04/21.  Prescription has been e-scribed to the [facility] for benefits analysis and approval.  Oral Oncology Clinic will continue to follow for insurance authorization, copayment issues, initial counseling and start date.  Drema Halon, PharmD Hematology/Oncology Clinical Pharmacist [facility] [phone] 11/05/2021 9:28 AM

## 2021-11-05 NOTE — Telephone Encounter (Addendum)
Oral Chemotherapy Pharmacist Encounter  I spoke with patient for overview of new oral chemotherapy medication: Piqray (alpelisib) for the treatment of metastatic, hormone receptor positive breast cancer, previously treated with endocrine based therapy, with known PIK3CA mutation, in conjunction with letrozole, planned duration until disease progression or unacceptable toxicity.  Counseled on administration, dosing, side effects, monitoring, drug-food interactions, safe handling, storage, and disposal.  Patient will take Piqray 295m tablets, 1 tablet (200 mg) by mouth once daily with food.  Piqray start date: 11/09/2021  Side effects include but not limited to: hyperglycemia, rash/hypersensitivity reaction, diarrhea, fatigue, nausea, vomiting, mouth sores, alopecia, taste changes, decreased appetite, severe lung reactions, and arthralgias.  Patient has anti-emetic on hand and knows to take it if nausea develops.   Patient will obtain anti diarrheal and alert the office of 4 or more loose stools above baseline.  Patient will pick up cetirizine over the counter medication and will start taking when she takes the [place]to decrease the chance of rash.  Discussed with patient need [facility] Patient is already currently on insulin and understands how to take the insulin and to call with any high blood sugar readings.   Discussed possibility of severe cutaneous reactions with Piqray. Patient will alert the office of any dermatologic toxicity that may occur after Piqray initiation.  Reviewed with patient importance of keeping a medication schedule and plan for any missed doses. No barriers to medication adherence identified.   Medication reconciliation performed and medication/allergy list updated.  Insurance authorization for PJoylene Drafthas been obtained. Test claim at the pharmacy revealed copayment $0 for 1st fill of 30 days. [place] patient's home on 11/06/21.  Patient informed the pharmacy will reach out 5-7 days prior to needing next fill of Piqray to coordinate continued medication acquisition to prevent break in therapy.  All questions answered.  Ms. GReicksvoiced understanding and appreciation.   Medication education handout placed in mail for patient. Patient knows to call the office with questions or concerns. Oral Chemotherapy Clinic phone number provided to patient.   KDrema Halon PharmD Hematology/Oncology Clinical Pharmacist [facility]3612 [phone] [id number]/14/2023   1:48 PM

## 2021-11-05 NOTE — Telephone Encounter (Signed)
Oral Oncology Patient Advocate Encounter  Prior Authorization for Joylene Draft has been approved.    PA# [id number] Effective dates: 11/04/2021 through further notice  Patients co-pay is $0.    Lady Deutscher, CPhT-Adv Oncology Pharmacy Patient [place] Direct Number: [phone]  Fax: [phone]

## 2021-11-06 ENCOUNTER — Other Ambulatory Visit (HOSPITAL_COMMUNITY): Payer: Self-pay

## 2021-11-09 ENCOUNTER — Inpatient Hospital Stay: Payer: Medicare Other | Admitting: Nurse Practitioner

## 2021-11-10 ENCOUNTER — Ambulatory Visit
Admission: EM | Admit: 2021-11-10 | Discharge: 2021-11-10 | Disposition: A | Discharge status: Home / Self Care | Payer: Medicare Other | Attending: Physician Assistant | Admitting: Physician Assistant

## 2021-11-10 ENCOUNTER — Ambulatory Visit: Payer: Self-pay | Admitting: *Deleted

## 2021-11-10 DIAGNOSIS — R0602 Shortness of breath: Secondary | ICD-10-CM

## 2021-11-10 DIAGNOSIS — J441 Chronic obstructive pulmonary disease with (acute) exacerbation: Secondary | ICD-10-CM | POA: Diagnosis not present

## 2021-11-10 MED ORDER — AZITHROMYCIN 250 MG PO TABS
250.0000 mg | ORAL_TABLET | Freq: Every day | ORAL | 0 refills | Status: DC
Start: 2021-11-10 — End: 2021-11-18

## 2021-11-10 MED ORDER — IPRATROPIUM-ALBUTEROL 0.5-2.5 (3) MG/3ML IN SOLN
3.0000 mL | Freq: Once | RESPIRATORY_TRACT | Status: AC
  Administered 2021-11-10: 3 mL via RESPIRATORY_TRACT

## 2021-11-10 MED ORDER — COMBIVENT RESPIMAT 20-100 MCG/ACT IN AERS: 1.0000 | INHALATION_SPRAY | Freq: Four times a day (QID) | RESPIRATORY_TRACT | 1 refills | Status: AC

## 2021-11-10 MED ORDER — PREDNISONE 10 MG PO TABS: 10.0000 mg | ORAL_TABLET | Freq: Three times a day (TID) | ORAL | 0 refills | Status: DC

## 2021-11-10 MED ORDER — ALBUTEROL SULFATE HFA 108 (90 BASE) MCG/ACT IN AERS: 1.0000 | INHALATION_SPRAY | Freq: Four times a day (QID) | RESPIRATORY_TRACT | 1 refills | Status: AC | PRN

## 2021-11-10 NOTE — Telephone Encounter (Signed)
  Chief Complaint: difficulty breathing  Symptoms: shortness of breath and difficulty breathing hx COPD . Worse after coughing spells. Nonproductive. Has used inhalers x 3 last night with minimal relief. Reports started and getting worse since taking "vesino".  Frequency: 1 year but getting worse Pertinent Negatives: Patient denies chest pain no fever, no elevated heart rate.  Disposition: '[]'$ ED /'[x]'$ Urgent Care (no appt availability in office) / '[]'$ Appointment(In office/virtual)/ '[]'$  [place] [facility] [facility]/ '[]'$ Home Care/ '[]'$ Refused Recommended Disposition /'[]'$ [place] [facility]/ '[]'$  Follow-up with PCP Additional Notes:   No available appt. Recommended UC . Patient reports she would like humalin R removed from med list because she does not take it . Please advise if patient can be seen today .    Reason for Disposition  [1] Longstanding difficulty breathing (e.g., CHF, COPD, emphysema) AND [2] WORSE than normal  Answer Assessment - Initial Assessment Questions 1. RESPIRATORY STATUS: "Describe your breathing?" (e.g., wheezing, shortness of breath, unable to speak, severe coughing)      Short of breath at rest, severe coughing  2. ONSET: "When did this breathing problem begin?"      Getting worse since May  3. PATTERN "Does the difficult breathing come and go, or has it been constant since it started?"      Constant  4. SEVERITY: "How bad is your breathing?" (e.g., mild, moderate, severe)    - MILD: No SOB at rest, mild SOB with walking, speaks normally in sentences, can lie down, no retractions, pulse < 100.    - MODERATE: SOB at rest, SOB with minimal exertion and prefers to sit, cannot lie down flat, speaks in phrases, mild retractions, audible wheezing, pulse 100-120.    - SEVERE: Very SOB at rest, speaks in single words, struggling to breathe, sitting hunched forward, retractions, pulse > 120      Moderate  5. RECURRENT SYMPTOM: "Have you had difficulty breathing before?" If Yes, ask: "When  was the last time?" and "What happened that time?"      1 year ago  6. CARDIAC HISTORY: "Do you have any history of heart disease?" (e.g., heart attack, angina, bypass surgery, angioplasty)      See hx  7. LUNG HISTORY: "Do you have any history of lung disease?"  (e.g., pulmonary embolus, asthma, emphysema)     Hx COPD 8. CAUSE: "What do you think is causing the breathing problem?"      HX COPD, broken ribs 9. OTHER SYMPTOMS: "Do you have any other symptoms? (e.g., dizziness, runny nose, cough, chest pain, fever)     Shortness of breath worsening since taking "vesino" 10. O2 SATURATION MONITOR:  "Do you use an oxygen saturation monitor (pulse oximeter) at home?" If Yes, ask: "What is your reading (oxygen level) today?" "What is your usual oxygen saturation reading?" (e.g., 95%)       na 11. PREGNANCY: "Is there any chance you are pregnant?" "When was your last menstrual period?"       na 12. TRAVEL: "Have you traveled out of the country in the last month?" (e.g., travel history, exposures)       na  Protocols used: Breathing Difficulty-A-AH

## 2021-11-10 NOTE — ED Triage Notes (Signed)
Pt presents to uc with co of increased sob. Pt reports she has metastatic ca and has had broken ribs a few months ago. Pt reports over the last few days her sob is worsening, has been taking expired albuterol inhaler she had for symptoms

## 2021-11-10 NOTE — ED Provider Notes (Signed)
Emily Wilson    [name]: [id number] Arrival date & time: 11/10/21  1253      History   Chief Complaint Chief Complaint  Patient presents with   Shortness of Breath    HPI Emily Wilson is a 56 y.o. female.   56 year old female presents with shortness of breath and wheezing.  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  She indicates she is under treatment for this.  She indicates since July when she fractured her rib coughing she has been having continued intermittent shortness of breath and difficulty breathing.  She relates that she used to smoke 3 packs a day which she did for many years before stopping.  She indicates that she has COPD, and is currently not using any inhalers to decrease her shortness of breath.  She relates she did have an albuterol inhaler but this was expired, she used it last night which tended to help a little bit.  She indicates she has not have any fever, chills, or respiratory symptoms to make her symptoms worse.  She indicates she does have an appointment with her PCP at the beginning part of October for evaluation of her COPD.  She denies any nausea or vomiting.   Shortness of Breath   Past Medical History:  Diagnosis Date   Anxiety state, unspecified    Arthritis    Breast cancer of lower-inner quadrant of right female breast [facility])    Carpal tunnel syndrome    Complication of anesthesia    woke up during ganglion cyst removal in her 20's, not woken up since   Diabetes mellitus without complication (HCC)    Type II   Diverticulosis    Heart murmur    History of radiation therapy 12/18/18- 01/30/19   Right Chest wall 25 fractions X 2Gy each to total 50 Gy, followed by a boost 10 Gy in 5 fractions.    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, without mention of  thyrotoxic crisis or storm     Patient Active Problem List   Diagnosis Date Noted   Diverticulosis of colon 09/01/2021   Slow transit constipation 09/01/2021   History of shingles 01/09/2019   S/P mastectomy, right 11/16/2018   Malignant neoplasm metastatic to bone ([place]) 05/31/2018   Family history of breast cancer in female 03/13/2015   Malignant neoplasm of lower-inner quadrant of right breast of female, estrogen receptor positive ([place]) 02/25/2015   DM (diabetes mellitus), type 2 ([place]) 02/25/2015   HLD (hyperlipidemia) 02/25/2015   Palpitations 01/26/2010   Anxiety state 05/26/2009   Hyperthyroidism 11/14/2006   Hypertension 11/14/2006    Past Surgical History:  Procedure Laterality Date   ABDOMINAL HYSTERECTOMY     ANTERIOR CRUCIATE LIGAMENT REPAIR Right 2007   ACL repair   APPENDECTOMY     CARPAL TUNNEL RELEASE Right    GANGLION CYST EXCISION Right    TOTAL MASTECTOMY Right 11/16/2018   Procedure: RIGHT MASTECTOMY;  Surgeon: Coralie Keens, MD;  Location: [facility];  Service: General;  Laterality: Right;   TUBAL LIGATION      OB History   No obstetric history on file.      Home Medications    Prior to Admission medications   Medication Sig Start Date End Date  Taking? Authorizing Provider  albuterol (VENTOLIN HFA) 108 (90 Base) MCG/ACT inhaler Inhale 1-2 puffs into the lungs every 6 (six) hours as needed for wheezing or shortness of breath. 11/10/21  Yes Nyoka Lint, PA-C  azithromycin (ZITHROMAX) 250 MG tablet Take 1 tablet (250 mg total) by mouth daily. Take first 2 tablets together, then 1 every day until finished. 11/10/21  Yes Nyoka Lint, PA-C  Ipratropium-Albuterol (COMBIVENT RESPIMAT) 20-100 MCG/ACT AERS respimat Inhale 1 puff into the lungs every 6 (six) hours. 11/10/21  Yes Nyoka Lint, PA-C  predniSONE (DELTASONE) 10 MG tablet Take 1 tablet (10 mg total) by mouth in the morning, at noon, and at bedtime. 11/10/21  Yes Nyoka Lint, PA-C  Accu-Chek FastClix  Lancets MISC USE TO TEST BLOOD SUGAR UP TO FOUR TIMES DAILY AS DIRECTED 11/02/21   Dorna Mai, MD  alpelisib 200 mg daily dose (PIQRAY, 200 MG DAILY DOSE,) tablet Take 1 tablet (200 mg total) by mouth daily. Take with food daily. 11/04/21   [name], MD  BD INSULIN SYRINGE U/F 31G X 5/16" 0.5 ML MISC USE TO ADMINISTER INSULIN 3 TIMES DAILY WITH MEALS 09/07/21   Dorna Mai, MD  bismuth subsalicylate (PEPTO BISMOL) 262 MG/15ML suspension Take 30 mLs by mouth every 6 (six) hours as needed.    [provider]  denosumab (XGEVA) 120 MG/1.7ML SOLN injection Inject 120 mg into the skin every 3 (three) months.    [provider]  diclofenac (VOLTAREN) 75 MG EC tablet TAKE 1 TABLET BY MOUTH 2 TIMES DAILY AS NEEDED. 08/10/21   [name], MD  dicyclomine (BENTYL) 20 MG tablet Take 1 tablet (20 mg total) by mouth every 6 (six) hours. 05/20/21   Dorna Mai, MD  gabapentin (NEURONTIN) 100 MG capsule Take 3 capsules (300 mg total) by mouth 3 (three) times daily. 02/06/19   [name], MD  glucose blood (ACCU-CHEK GUIDE) test strip USE TO CHECK BLOOD SUGAR UP TO 4 TIMES A DAY (E11.65) 07/31/21   Dorna Mai, MD  HYDROmorphone (DILAUDID) 4 MG tablet Take 1 tablet (4 mg total) by mouth 4 (four) times daily as needed for severe pain. 10/28/21   Eppie Gibson, MD  insulin NPH Human (HUMULIN N) 100 UNIT/ML injection INJECT 12 UNITS INTO THE SKIN AT BEDTIME 07/23/20   [name], MD  insulin regular (NOVOLIN R) 100 units/mL injection INJECT 12 UNITS TOTAL INTO THE SKIN 3X DAILY BEFORE MEALS. HOLD DOSE FOR BLOOD SUGAR LESS THAN 125 11/02/21   Dorna Mai, MD  letrozole [facility]) 2.5 MG tablet TAKE 1 TABLET BY MOUTH EVERY DAY 10/30/20   [name], MD  loperamide (IMODIUM) 2 MG capsule Take 2 mg by mouth as needed for diarrhea or loose stools. Take 2 tabs (4 mg) with first loose stool, then 1 tab (2 mg) with each additional loose stool. Do not take more than 8 tabs (16 mg) in  a 24-hour period.    [name], MD  meclizine (ANTIVERT) 25 MG tablet Take 1 tablet (25 mg total) by mouth 3 (three) times daily as needed for dizziness. 04/30/21   Francene Finders, PA-C  metoprolol succinate (TOPROL-XL) 25 MG 24 hr tablet Take 1 tablet (25 mg total) by mouth daily. 04/14/21   [name], MD  Milk Thistle 1000 MG CAPS Take 1,000 mg by mouth daily as needed.    [provider]  ondansetron (ZOFRAN ODT) 8 MG disintegrating tablet Take 1 tablet (8 mg total) by mouth every 8 (eight)  hours as needed for nausea or vomiting. 04/20/21   [name], MD  PEG-KCl-NaCl-NaSulf-Na Asc-C (PLENVU) 140 g SOLR Take 1 kit by mouth as directed. Use coupon: BIN: [id number] PNC: CNRX Group: [id number] ID: [id number] 06/09/21   Doran Stabler, MD  rosuvastatin (CRESTOR) 40 MG tablet TAKE 1 TABLET BY MOUTH EVERY DAY 10/09/21   Dorna Mai, MD    Family History Family History  Problem Relation Age of Onset   Diabetes Mother    Bladder Cancer Mother 25       smoker   Other Mother 64       TAH for unspecified reason   Multiple sclerosis Mother    Kidney failure Mother    Cancer Father 53       lymphatic/tonsil cancer - in remission; former smoker   Diabetes Sister    Diabetes Sister    Heart attack Brother    COPD Maternal Grandmother        smoker   Emphysema Maternal Grandmother        smoker   Diabetes Maternal Grandmother    Stroke Maternal Grandfather    Diabetes Paternal Grandmother    Dementia Maternal Uncle    COPD Maternal Uncle        smoker   Multiple sclerosis Paternal Aunt    Breast cancer Paternal Aunt        dx. late 90s - early 56s    Social History Social History   Tobacco Use   Smoking status: Former    Packs/day: 3.00    Years: 30.00    Total pack years: 90.00    Types: Cigarettes    Quit date: 10/24/2011    Years since quitting: 10.0   Smokeless tobacco: Never  Vaping Use   Vaping Use: Never used  Substance Use Topics   Alcohol use: No    Drug use: Never     Allergies   Doxycycline, Naproxen, Oxycodone-acetaminophen, Codeine, Hydrocodone-acetaminophen, Ibuprofen, Lantus [insulin glargine], Meloxicam, Propoxyphene n-acetaminophen, Sulfa antibiotics, Sulfonamide derivatives, and Tramadol   Review of Systems Review of Systems  Respiratory:  Positive for shortness of breath.      Physical Exam Triage Vital Signs ED Triage Vitals  Enc Vitals Group     BP 11/10/21 1319 119/72     Pulse Rate 11/10/21 1319 (!) 102     Resp 11/10/21 1319 18     Temp 11/10/21 1319 97.9 F (36.6 C)     Temp Source 11/10/21 1319 Oral     SpO2 11/10/21 1319 95 %     Weight --      Height --      Head Circumference --      Peak Flow --      Pain Score 11/10/21 1318 5     Pain Loc --      Pain Edu? --      Excl. in [place]? --    No data found.  Updated Vital Signs BP 119/72   Pulse (!) 102   Temp 97.9 F (36.6 C) (Oral)   Resp 18   SpO2 95%   Visual Acuity Right Eye Distance:   Left Eye Distance:   Bilateral Distance:    Right Eye Near:   Left Eye Near:    Bilateral Near:     Physical Exam Constitutional:      Appearance: She is well-developed.  Cardiovascular:     Rate and Rhythm: Normal rate and regular rhythm.  Heart sounds: Normal heart sounds.  Pulmonary:     Effort: Pulmonary effort is normal.     Breath sounds: Normal breath sounds and air entry. No wheezing, rhonchi or rales.  Lymphadenopathy:     Cervical: No cervical adenopathy.  Neurological:     Mental Status: She is alert.      UC Treatments / Results  Labs (all labs ordered are listed, but only abnormal results are displayed) Labs Reviewed - No data to display  EKG   Radiology No results found.  Procedures Procedures (including critical Wilson time)  Medications Ordered in UC Medications  ipratropium-albuterol (DUONEB) 0.5-2.5 (3) MG/3ML nebulizer solution 3 mL (3 mLs Nebulization Given 11/10/21 1348)    Initial Impression /  Assessment and Plan / UC Course  I have reviewed the triage vital signs and the nursing notes.  Pertinent labs & imaging results that were available during my Wilson of the patient were reviewed by me and considered in my medical decision making (see chart for details).       Plan: 1.  Advised to use the albuterol inhaler with spacer, 2 puffs every 6 hours on a regular basis to help reduce the shortness of breath. 2.  Advised to use the Combivent inhaler, 1 puff every 6-8 hours on a regular basis to help reduce shortness of breath. 3.  Advised take prednisone 10 mg every 8 hours until completed to help reduce the shortness of breath and the respiratory inflammation. .  Advised to follow-up with PCP at the regular appointment that is scheduled. Final Clinical Impressions(s) / UC Diagnoses   Final diagnoses:  SOB (shortness of breath)  COPD exacerbation ([place])     Discharge Instructions      Advised to use the albuterol inhaler with the spacer, 2 puffs every 6 hours on a regular basis to help decrease the shortness of breath. Advised to use the Combivent inhaler, 1 puff every 6-8 hours to help reduce the shortness of breath. Advised take prednisone 10 mg every 8 hours until completed to help reduce the respiratory inflammation. Advised to follow-up with PCP in the next 2 to 3 weeks when her appointment is scheduled to be evaluated and COPD medication adjusted.     ED Prescriptions     Medication Sig Dispense Auth. Provider   Ipratropium-Albuterol (COMBIVENT RESPIMAT) 20-100 MCG/ACT AERS respimat Inhale 1 puff into the lungs every 6 (six) hours. 4 g Nyoka Lint, PA-C   albuterol (VENTOLIN HFA) 108 (90 Base) MCG/ACT inhaler Inhale 1-2 puffs into the lungs every 6 (six) hours as needed for wheezing or shortness of breath. 8 g Nyoka Lint, PA-C   predniSONE (DELTASONE) 10 MG tablet Take 1 tablet (10 mg total) by mouth in the morning, at noon, and at bedtime. 15 tablet Nyoka Lint,  PA-C   azithromycin (ZITHROMAX) 250 MG tablet Take 1 tablet (250 mg total) by mouth daily. Take first 2 tablets together, then 1 every day until finished. 6 tablet Nyoka Lint, PA-C      PDMP not reviewed this encounter.   Nyoka Lint, PA-C 11/10/21 1405

## 2021-11-10 NOTE — Discharge Instructions (Addendum)
Advised to use the albuterol inhaler with the spacer, 2 puffs every 6 hours on a regular basis to help decrease the shortness of breath. Advised to use the Combivent inhaler, 1 puff every 6-8 hours to help reduce the shortness of breath. Advised take prednisone 10 mg every 8 hours until completed to help reduce the respiratory inflammation. Advised to follow-up with PCP in the next 2 to 3 weeks when her appointment is scheduled to be evaluated and COPD medication adjusted.

## 2021-11-10 NOTE — Telephone Encounter (Signed)
Patient was called to give an appt with a provider. Patient did not answer the phone and LVM on machine to go to UC or the ER in symptoms get worse

## 2021-11-13 ENCOUNTER — Telehealth: Payer: Self-pay | Admitting: *Deleted

## 2021-11-13 NOTE — Chronic Care Management (AMB) (Signed)
  Care Coordination   Note   11/13/2021 Name: Emily Wilson MRN: [id number] DOB: May 06, 1965  Emily Wilson is a 56 y.o. year old female who sees Dorna Mai, MD for primary care. I reached out to Emily Wilson by phone today to offer care coordination services.  Emily Wilson was given information about Care Coordination services today including:   The Care Coordination services include support from the care team which includes your Nurse Coordinator, Clinical Social Worker, or Pharmacist.  The Care Coordination team is here to help remove barriers to the health concerns and goals most important to you. Care Coordination services are voluntary, and the patient may decline or stop services at any time by request to their care team member.   Care Coordination Consent Status: Patient agreed to services and verbal consent obtained.   Follow up plan:  Telephone appointment with care coordination team member scheduled for:  11/17/21  Encounter Outcome:  Pt. Scheduled  [place]  Direct Dial: [phone]

## 2021-11-16 ENCOUNTER — Encounter (HOSPITAL_COMMUNITY): Payer: Self-pay

## 2021-11-16 ENCOUNTER — Telehealth: Payer: Self-pay

## 2021-11-16 ENCOUNTER — Inpatient Hospital Stay (HOSPITAL_COMMUNITY)
Admission: EM | Admit: 2021-11-16 | Discharge: 2021-11-18 | DRG: 394 | Disposition: A | Discharge status: Home / Self Care | Payer: Medicare Other | Attending: Student | Admitting: Student

## 2021-11-16 ENCOUNTER — Telehealth: Payer: Self-pay | Admitting: Gastroenterology

## 2021-11-16 ENCOUNTER — Emergency Department (HOSPITAL_COMMUNITY): Payer: Medicare Other

## 2021-11-16 DIAGNOSIS — E119 Type 2 diabetes mellitus without complications: Secondary | ICD-10-CM

## 2021-11-16 DIAGNOSIS — Z7984 Long term (current) use of oral hypoglycemic drugs: Secondary | ICD-10-CM

## 2021-11-16 DIAGNOSIS — Z882 Allergy status to sulfonamides status: Secondary | ICD-10-CM

## 2021-11-16 DIAGNOSIS — Z833 Family history of diabetes mellitus: Secondary | ICD-10-CM

## 2021-11-16 DIAGNOSIS — Z9851 Tubal ligation status: Secondary | ICD-10-CM

## 2021-11-16 DIAGNOSIS — N390 Urinary tract infection, site not specified: Secondary | ICD-10-CM | POA: Diagnosis present

## 2021-11-16 DIAGNOSIS — N824 Other female intestinal-genital tract fistulae: Principal | ICD-10-CM | POA: Diagnosis present

## 2021-11-16 DIAGNOSIS — Z79899 Other long term (current) drug therapy: Secondary | ICD-10-CM

## 2021-11-16 DIAGNOSIS — E0590 Thyrotoxicosis, unspecified without thyrotoxic crisis or storm: Secondary | ICD-10-CM | POA: Diagnosis present

## 2021-11-16 DIAGNOSIS — L988 Other specified disorders of the skin and subcutaneous tissue: Secondary | ICD-10-CM | POA: Diagnosis not present

## 2021-11-16 DIAGNOSIS — E039 Hypothyroidism, unspecified: Secondary | ICD-10-CM | POA: Diagnosis present

## 2021-11-16 DIAGNOSIS — C50311 Malignant neoplasm of lower-inner quadrant of right female breast: Secondary | ICD-10-CM | POA: Diagnosis present

## 2021-11-16 DIAGNOSIS — N823 Fistula of vagina to large intestine: Secondary | ICD-10-CM | POA: Diagnosis not present

## 2021-11-16 DIAGNOSIS — K5730 Diverticulosis of large intestine without perforation or abscess without bleeding: Secondary | ICD-10-CM | POA: Diagnosis present

## 2021-11-16 DIAGNOSIS — Z803 Family history of malignant neoplasm of breast: Secondary | ICD-10-CM

## 2021-11-16 DIAGNOSIS — Z853 Personal history of malignant neoplasm of breast: Secondary | ICD-10-CM

## 2021-11-16 DIAGNOSIS — C787 Secondary malignant neoplasm of liver and intrahepatic bile duct: Secondary | ICD-10-CM | POA: Diagnosis present

## 2021-11-16 DIAGNOSIS — J449 Chronic obstructive pulmonary disease, unspecified: Secondary | ICD-10-CM | POA: Diagnosis present

## 2021-11-16 DIAGNOSIS — Z87891 Personal history of nicotine dependence: Secondary | ICD-10-CM

## 2021-11-16 DIAGNOSIS — Z923 Personal history of irradiation: Secondary | ICD-10-CM

## 2021-11-16 DIAGNOSIS — Z881 Allergy status to other antibiotic agents status: Secondary | ICD-10-CM

## 2021-11-16 DIAGNOSIS — Z9011 Acquired absence of right breast and nipple: Secondary | ICD-10-CM

## 2021-11-16 DIAGNOSIS — C50919 Malignant neoplasm of unspecified site of unspecified female breast: Secondary | ICD-10-CM | POA: Diagnosis present

## 2021-11-16 DIAGNOSIS — C7951 Secondary malignant neoplasm of bone: Secondary | ICD-10-CM | POA: Diagnosis present

## 2021-11-16 DIAGNOSIS — I10 Essential (primary) hypertension: Secondary | ICD-10-CM | POA: Diagnosis present

## 2021-11-16 DIAGNOSIS — Z8249 Family history of ischemic heart disease and other diseases of the circulatory system: Secondary | ICD-10-CM

## 2021-11-16 DIAGNOSIS — Z885 Allergy status to narcotic agent status: Secondary | ICD-10-CM

## 2021-11-16 DIAGNOSIS — Z170 Estrogen receptor positive status [ER+]: Secondary | ICD-10-CM

## 2021-11-16 DIAGNOSIS — Z9049 Acquired absence of other specified parts of digestive tract: Secondary | ICD-10-CM

## 2021-11-16 DIAGNOSIS — Z888 Allergy status to other drugs, medicaments and biological substances status: Secondary | ICD-10-CM

## 2021-11-16 DIAGNOSIS — F411 Generalized anxiety disorder: Secondary | ICD-10-CM | POA: Diagnosis present

## 2021-11-16 DIAGNOSIS — E785 Hyperlipidemia, unspecified: Secondary | ICD-10-CM | POA: Diagnosis present

## 2021-11-16 LAB — COMPREHENSIVE METABOLIC PANEL
ALT: 15 U/L (ref 0–44)
AST: 24 U/L (ref 15–41)
Albumin: 3.4 g/dL — ABNORMAL LOW (ref 3.5–5.0)
Alkaline Phosphatase: 122 U/L (ref 38–126)
Anion gap: 10 (ref 5–15)
BUN: 16 mg/dL (ref 6–20)
CO2: 22 mmol/L (ref 22–32)
Calcium: 8.6 mg/dL — ABNORMAL LOW (ref 8.9–10.3)
Chloride: 101 mmol/L (ref 98–111)
Creatinine, Ser: 0.67 mg/dL (ref 0.44–1.00)
GFR, Estimated: >60 mL/min (ref >60)
Glucose, Bld: 188 mg/dL — ABNORMAL HIGH (ref 70–99)
Potassium: 3.9 mmol/L (ref 3.5–5.1)
Sodium: 133 mmol/L — ABNORMAL LOW (ref 135–145)
Total Bilirubin: 0.6 mg/dL (ref 0.3–1.2)
Total Protein: 7.1 g/dL (ref 6.5–8.1)

## 2021-11-16 LAB — CBC WITH DIFFERENTIAL/PLATELET
Abs Immature Granulocytes: 0.09 10*3/uL — ABNORMAL HIGH (ref 0.00–0.07)
Basophils Absolute: 0.1 10*3/uL (ref 0.0–0.1)
Basophils Relative: 1 %
Eosinophils Absolute: 0.1 10*3/uL (ref 0.0–0.5)
Eosinophils Relative: 2 %
HCT: 41.7 % (ref 36.0–46.0)
Hemoglobin: 14.1 g/dL (ref 12.0–15.0)
Immature Granulocytes: 1 %
Lymphocytes Relative: 12 %
Lymphs Abs: 0.9 10*3/uL (ref 0.7–4.0)
MCH: 32 pg (ref 26.0–34.0)
MCHC: 33.8 g/dL (ref 30.0–36.0)
MCV: 94.8 fL (ref 80.0–100.0)
Monocytes Absolute: 0.9 10*3/uL (ref 0.1–1.0)
Monocytes Relative: 12 %
Neutro Abs: 5.2 10*3/uL (ref 1.7–7.7)
Neutrophils Relative %: 72 %
Platelets: 365 10*3/uL (ref 150–400)
RBC: 4.4 MIL/uL (ref 3.87–5.11)
RDW: 12.7 % (ref 11.5–15.5)
WBC: 7.3 10*3/uL (ref 4.0–10.5)
nRBC: 0 % (ref 0.0–0.2)

## 2021-11-16 LAB — URINALYSIS, ROUTINE W REFLEX MICROSCOPIC
Bacteria, UA: NONE SEEN
Bilirubin Urine: NEGATIVE
Glucose, UA: >=500 mg/dL — AB
Hgb urine dipstick: NEGATIVE
Ketones, ur: NEGATIVE mg/dL
Nitrite: NEGATIVE
Protein, ur: NEGATIVE mg/dL
Specific Gravity, Urine: 1.022 (ref 1.005–1.030)
WBC, UA: >50 WBC/hpf — ABNORMAL HIGH (ref 0–5)
pH: 5 (ref 5.0–8.0)

## 2021-11-16 LAB — LACTIC ACID, PLASMA: Lactic Acid, Venous: 1.4 mmol/L (ref 0.5–1.9)

## 2021-11-16 LAB — LIPASE, BLOOD: Lipase: 111 U/L — ABNORMAL HIGH (ref 11–51)

## 2021-11-16 MED ORDER — PIPERACILLIN-TAZOBACTAM 3.375 G IVPB 30 MIN
3.3750 g | Freq: Once | INTRAVENOUS | Status: AC
  Administered 2021-11-16: 3.375 g via INTRAVENOUS
  Filled 2021-11-16: qty 50

## 2021-11-16 MED ORDER — IOHEXOL 9 MG/ML PO SOLN
1000.0000 mL | Freq: Once | ORAL | Status: AC
  Administered 2021-11-16: 1000 mL via ORAL

## 2021-11-16 MED ORDER — IOHEXOL 300 MG/ML  SOLN
100.0000 mL | Freq: Once | INTRAMUSCULAR | Status: AC | PRN
  Administered 2021-11-16: 100 mL via INTRAVENOUS

## 2021-11-16 NOTE — ED Provider Triage Note (Signed)
Emergency Medicine Provider Triage Evaluation Note  Emily Wilson , a 56 y.o. female  was evaluated in triage.  Pt complains of passing blood, feces, pus from vagina onset last night. Sent by her oncologist, treated for metastatic breast cancer, not currently on chemo (was to start today but did not due to this new problem). Treated with multiple antibiotics for various other infections recently.  No GI history other than diverticulosis.   Review of Systems  Positive: As above, chills, constipation  Negative: Fevers, dysuria  Physical Exam  BP 125/79 (BP Location: Right Arm)   Pulse 85   Temp 98.2 F (36.8 C) (Oral)   Resp 18   Ht '4\' 11"'$  (1.499 m)   Wt 70.8 kg   SpO2 96%   BMI 31.51 kg/m  Gen:   Awake, no distress   Resp:  Normal effort  MSK:   Moves extremities without difficulty  Other:    Medical Decision Making  Medically screening exam initiated at 3:28 PM.  Appropriate orders placed.  [name] was informed that the remainder of the evaluation will be completed by another provider, this initial triage assessment does not replace that evaluation, and the importance of remaining in the ED until their evaluation is complete.     Tacy Learn, PA-C 11/16/21 1528

## 2021-11-16 NOTE — ED Triage Notes (Addendum)
Pt states that she first noticed stool coming out of her vagina last night. Pt states that she noticed air coming out of vagina and rectum for a "while". Pt states she has had blood coming out both as well.

## 2021-11-16 NOTE — Telephone Encounter (Signed)
Pt called and states as of last night, she has stool and "blood-tinged puss" coming from her vagina and her rectum. Pt also reports flatulence from her vagina and rectum. She does report some abdominal discomfort. 8/15 she was placed on Levaquin and Flagyl for diverticulitis and completed that.   Consulted with Herbert Seta, RN at Dr Loletha Carrow' office to advise if they are able to see pt. She states Dr Loletha Carrow is on call and is not able to see pt in office today. S/w Dr Lindi Adie as well, and in the case of a possible rectovaginal fistula pt will need to be seen in ED for further eval as she does not have a current GYN. Pt was advised to go to [facility] ED and she states she will shower then go. Call placed to ED Charge RN, Mendel Ryder to give report on pt.

## 2021-11-16 NOTE — ED Provider Notes (Signed)
[facility] DEPT Provider Note   [name]: [id number] Arrival date & time: 11/16/21  1348     History  Chief Complaint  Patient presents with   Vaginal Discharge    Emily Wilson is a 56 y.o. female.  The history is provided by the patient and medical records.  Vaginal Discharge  56 y.o. F with hx of DM2, HTN, hyperthyroidism, HLD, metastatic breast cancer currently undergoing palliative radiation, presenting to the ED for stool coming from her vagina.  States this initially started about 1 month ago-- started as air, then progressed to liquids, now over the past few days having stool coming from vagina.  States does seem to be a bit pink/blood-tinged.  She was supposed to start chemo today but delayed it due to her symptoms.  She denies fever but does report some night sweats.  No urinary symptoms.  Was recently on abx for UTI at first, then coverage of diverticulitis, then URI symptoms.  States issues did seem to get better while on abx but worsened again once stopped.  Denies any abdominal pain.  Hx of appendectomy.  Home Medications Prior to Admission medications   Medication Sig Start Date End Date Taking? Authorizing Provider  Accu-Chek FastClix Lancets MISC USE TO TEST BLOOD SUGAR UP TO FOUR TIMES DAILY AS DIRECTED 11/02/21   Dorna Mai, MD  albuterol (VENTOLIN HFA) 108 (90 Base) MCG/ACT inhaler Inhale 1-2 puffs into the lungs every 6 (six) hours as needed for wheezing or shortness of breath. 11/10/21   Nyoka Lint, PA-C  alpelisib 200 mg daily dose (PIQRAY, 200 MG DAILY DOSE,) tablet Take 1 tablet (200 mg total) by mouth daily. Take with food daily. 11/04/21   [name], MD  azithromycin (ZITHROMAX) 250 MG tablet Take 1 tablet (250 mg total) by mouth daily. Take first 2 tablets together, then 1 every day until finished. 11/10/21   Nyoka Lint, PA-C  BD INSULIN SYRINGE U/F 31G X 5/16" 0.5 ML MISC USE TO ADMINISTER INSULIN 3 TIMES DAILY WITH MEALS  09/07/21   Dorna Mai, MD  bismuth subsalicylate (PEPTO BISMOL) 262 MG/15ML suspension Take 30 mLs by mouth every 6 (six) hours as needed.    [provider]  denosumab (XGEVA) 120 MG/1.7ML SOLN injection Inject 120 mg into the skin every 3 (three) months.    [provider]  diclofenac (VOLTAREN) 75 MG EC tablet TAKE 1 TABLET BY MOUTH 2 TIMES DAILY AS NEEDED. 08/10/21   [name], MD  dicyclomine (BENTYL) 20 MG tablet Take 1 tablet (20 mg total) by mouth every 6 (six) hours. 05/20/21   Dorna Mai, MD  gabapentin (NEURONTIN) 100 MG capsule Take 3 capsules (300 mg total) by mouth 3 (three) times daily. 02/06/19   [name], MD  glucose blood (ACCU-CHEK GUIDE) test strip USE TO CHECK BLOOD SUGAR UP TO 4 TIMES A DAY (E11.65) 07/31/21   Dorna Mai, MD  HYDROmorphone (DILAUDID) 4 MG tablet Take 1 tablet (4 mg total) by mouth 4 (four) times daily as needed for severe pain. 10/28/21   Eppie Gibson, MD  insulin NPH Human (HUMULIN N) 100 UNIT/ML injection INJECT 12 UNITS INTO THE SKIN AT BEDTIME 07/23/20   [name], MD  insulin regular (NOVOLIN R) 100 units/mL injection INJECT 12 UNITS TOTAL INTO THE SKIN 3X DAILY BEFORE MEALS. HOLD DOSE FOR BLOOD SUGAR LESS THAN 125 11/02/21   Dorna Mai, MD  Ipratropium-Albuterol (COMBIVENT RESPIMAT) 20-100 MCG/ACT AERS respimat Inhale 1 puff into the  lungs every 6 (six) hours. 11/10/21   Nyoka Lint, PA-C  letrozole [facility]) 2.5 MG tablet TAKE 1 TABLET BY MOUTH EVERY DAY 10/30/20   [name], MD  loperamide (IMODIUM) 2 MG capsule Take 2 mg by mouth as needed for diarrhea or loose stools. Take 2 tabs (4 mg) with first loose stool, then 1 tab (2 mg) with each additional loose stool. Do not take more than 8 tabs (16 mg) in a 24-hour period.    [name], MD  meclizine (ANTIVERT) 25 MG tablet Take 1 tablet (25 mg total) by mouth 3 (three) times daily as needed for dizziness. 04/30/21   Francene Finders, PA-C  metoprolol  succinate (TOPROL-XL) 25 MG 24 hr tablet Take 1 tablet (25 mg total) by mouth daily. 04/14/21   [name], MD  Milk Thistle 1000 MG CAPS Take 1,000 mg by mouth daily as needed.    [provider]  ondansetron (ZOFRAN ODT) 8 MG disintegrating tablet Take 1 tablet (8 mg total) by mouth every 8 (eight) hours as needed for nausea or vomiting. 04/20/21   [name], MD  PEG-KCl-NaCl-NaSulf-Na Asc-C (PLENVU) 140 g SOLR Take 1 kit by mouth as directed. Use coupon: BIN: [id number] [facility]: CNRX Group: [id number] ID: [id number] 06/09/21   Doran Stabler, MD  predniSONE (DELTASONE) 10 MG tablet Take 1 tablet (10 mg total) by mouth in the morning, at noon, and at bedtime. 11/10/21   Nyoka Lint, PA-C  rosuvastatin (CRESTOR) 40 MG tablet TAKE 1 TABLET BY MOUTH EVERY DAY 10/09/21   Dorna Mai, MD      Allergies    Doxycycline, Naproxen, Oxycodone-acetaminophen, Codeine, Hydrocodone-acetaminophen, Ibuprofen, Lantus [insulin glargine], Meloxicam, Propoxyphene n-acetaminophen, Sulfa antibiotics, Sulfonamide derivatives, and Tramadol    Review of Systems   Review of Systems  Genitourinary:  Positive for vaginal discharge.  All other systems reviewed and are negative.   Physical Exam Updated Vital Signs BP 121/78   Pulse 81   Temp 98.4 F (36.9 C)   Resp 17   Ht '4\' 11"'  (1.499 m)   Wt 70.8 kg   SpO2 99%   BMI 31.51 kg/m   Physical Exam Vitals and nursing note reviewed.  Constitutional:      Appearance: She is well-developed.  HENT:     Head: Normocephalic and atraumatic.  Eyes:     Conjunctiva/sclera: Conjunctivae normal.     Pupils: Pupils are equal, round, and reactive to light.  Cardiovascular:     Rate and Rhythm: Normal rate and regular rhythm.     Heart sounds: Normal heart sounds.  Pulmonary:     Effort: Pulmonary effort is normal.     Breath sounds: Normal breath sounds.  Abdominal:     General: Bowel sounds are normal.     Palpations: Abdomen is soft.      Comments: Soft, non-tender  Musculoskeletal:        General: Normal range of motion.     Cervical back: Normal range of motion.  Skin:    General: Skin is warm and dry.  Neurological:     Mental Status: She is alert and oriented to person, place, and time.     ED Results / Procedures / Treatments   Labs (all labs ordered are listed, but only abnormal results are displayed) Labs Reviewed  CBC WITH DIFFERENTIAL/PLATELET - Abnormal; Notable for the following components:      Result Value   Abs Immature Granulocytes 0.09 (*)    All  other components within normal limits  COMPREHENSIVE METABOLIC PANEL - Abnormal; Notable for the following components:   Sodium 133 (*)    Glucose, Bld 188 (*)    Calcium 8.6 (*)    Albumin 3.4 (*)    All other components within normal limits  LIPASE, BLOOD - Abnormal; Notable for the following components:   Lipase 111 (*)    All other components within normal limits  URINALYSIS, ROUTINE W REFLEX MICROSCOPIC - Abnormal; Notable for the following components:   Glucose, UA >=500 (*)    Leukocytes,Ua SMALL (*)    WBC, UA >50 (*)    All other components within normal limits  LACTIC ACID, PLASMA    EKG None  Radiology CT Abdomen Pelvis W Contrast  Result Date: 11/16/2021 CLINICAL DATA:  concern for fistula (passing blood/stool/purulent material from vagina EXAM: CT ABDOMEN AND PELVIS WITH CONTRAST TECHNIQUE: Multidetector CT imaging of the abdomen and pelvis was performed using the standard protocol following bolus administration of intravenous contrast. RADIATION DOSE REDUCTION: This exam was performed according to the departmental dose-optimization program which includes automated exposure control, adjustment of the mA and/or kV according to patient size and/or use of iterative reconstruction technique. CONTRAST:  15m OMNIPAQUE IOHEXOL 300 MG/ML  SOLN COMPARISON:  09/30/2021 FINDINGS: Lower chest: No acute abnormality Hepatobiliary: Enhancing lesion in  the lateral segment of the left hepatic lobe measuring 1.6 cm is stable since prior study. Small subcentimeter hypodensities in the hepatic dome best seen on coronal imaging. These are new since prior study. Cannot exclude small metastases. Low-density lesion in the right hepatic lobe on coronal image 30 series 5 measures 11 mm, also new since prior study concerning for metastasis. Small low-density lesion in the inferior right hepatic lobe on image 33 of series 2. Gallbladder unremarkable. Pancreas: No focal abnormality or ductal dilatation. Spleen: No focal abnormality.  Normal size. Adrenals/Urinary Tract: No adrenal abnormality. No focal renal abnormality. No stones or hydronephrosis. Urinary bladder is unremarkable. Stomach/Bowel: Rectal tube in place. Sigmoid diverticulosis and right colonic diverticulosis. No active diverticulitis. Stomach and small bowel decompressed. Vascular/Lymphatic: Aortic atherosclerosis. No evidence of aneurysm or adenopathy. Reproductive: Prior hysterectomy. No adnexal masses. On coronal images 64-67, there is concern for fistulous communication from the sigmoid colon to the vaginal cuff. A few locules of gas seen within the vagina. On sagittal image 77, there is possible 2 fistulae from the sigmoid colon to the vaginal cuff. No definite contrast within the vagina. Other: No free fluid or free air. 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 IMPRESSION: Several hypodensities in the liver, many of which are new since prior study. Findings concerning for worsening metastases. Concern for 2 fistulae between the sigmoid colon and vaginal cuff, best seen on coronal and sagittal imaging. Colonic diverticulosis. Electronically Signed   By: KRolm BaptiseM.D.   On: 11/16/2021 22:37    Procedures Procedures    Medications Ordered in ED Medications  piperacillin-tazobactam (ZOSYN) IVPB 3.375 g (3.375 g Intravenous  New Bag/Given 11/16/21 2350)  iohexol (OMNIPAQUE) 300 MG/ML solution 100 mL (100 mLs Intravenous Contrast Given 11/16/21 2157)  iohexol (OMNIPAQUE) 9 MG/ML oral solution 1,000 mL (1,000 mLs Oral Contrast Given 11/16/21 2157)    ED Course/ Medical Decision Making/ A&P                           Medical Decision Making Amount and/or Complexity of Data  Reviewed Radiology: ordered and independent interpretation performed. ECG/medicine tests: ordered and independent interpretation performed.  Risk Prescription drug management. Decision regarding hospitalization.   56 y.o. F presenting to the ED with contents from her vagina-- initially started as air, progressed to fluids, and now stool.  She denies fever but reports night sweats.  Afebrile, non-toxic here.  Abdomen soft, non-tender.  Labs overall reassuring-- normal WBC count, normal renal function.  CT has been performed and pending.  CT does reveal 2 fistulae between sigmoid colon and vaginal cuff.  Suspect this is etiology of her symptoms.  Will discuss with general surgery.  12:03 AM Spoke with Dr. Barry Dienes-- recommends to start abx, admit to medicine to have Dr. Marcello Moores (colorectal) evaluate and provide consultation-- may ultimately need ostomy.    Patient and husband updated on care plan, she is agreeable to admission.  IV zosyn started.  Spoke with hospitalist, Dr. Velia Meyer-- will admit for ongoing care.  Final Clinical Impression(s) / ED Diagnoses Final diagnoses:  Colovaginal fistula    Rx / DC Orders ED Discharge Orders     None         Larene Pickett, PA-C 11/17/21 0004    Dorie Rank, MD 11/17/21 [id number]

## 2021-11-16 NOTE — Telephone Encounter (Signed)
Given that description and clinical concern, my advice is that her cancer team arrange a CT scan abdomen and pelvis with oral, IV and rectal contrast as well as a consultation with colon and rectal surgery if that imaging suggests colovaginal fistula.  HD

## 2021-11-16 NOTE — Telephone Encounter (Signed)
Inbound call from Mickel Baas from the cancer center called stating she would like a call back to discuss office visit on 8/18 with Dr. Loletha Carrow. Please advise

## 2021-11-16 NOTE — ED Notes (Signed)
Pt needs IV for CT  

## 2021-11-16 NOTE — Telephone Encounter (Signed)
Returned call to Mickel Baas, LPN at [facility]. Mickel Baas states that patient's symptoms have not improved after treatment for diverticulitis. I advised Mickel Baas that based on last office note repeat abdominal imaging may be needed if symptoms did not improve. She states that she spoke with patient this morning and the patient reported feces, and blood tinged mucous coming from her vagina. Pt thinks that she may have a rectovaginal fistula. Mickel Baas, LPN states that she is going to discuss patient's case with Dr. Lindi Adie and they may refer her to the ED. I told them that this may be best since we are not able to care for the fistula at our office. I told Mickel Baas that you are covering the hospital this week, so if GI consult is needed you would be contacted.

## 2021-11-17 ENCOUNTER — Telehealth: Payer: Self-pay

## 2021-11-17 ENCOUNTER — Other Ambulatory Visit: Payer: Self-pay

## 2021-11-17 ENCOUNTER — Encounter (HOSPITAL_COMMUNITY): Payer: Self-pay | Admitting: Internal Medicine

## 2021-11-17 DIAGNOSIS — N390 Urinary tract infection, site not specified: Secondary | ICD-10-CM | POA: Diagnosis present

## 2021-11-17 DIAGNOSIS — I10 Essential (primary) hypertension: Secondary | ICD-10-CM | POA: Diagnosis present

## 2021-11-17 DIAGNOSIS — F411 Generalized anxiety disorder: Secondary | ICD-10-CM | POA: Diagnosis present

## 2021-11-17 DIAGNOSIS — Z885 Allergy status to narcotic agent status: Secondary | ICD-10-CM | POA: Diagnosis not present

## 2021-11-17 DIAGNOSIS — E119 Type 2 diabetes mellitus without complications: Secondary | ICD-10-CM | POA: Diagnosis present

## 2021-11-17 DIAGNOSIS — L988 Other specified disorders of the skin and subcutaneous tissue: Secondary | ICD-10-CM | POA: Diagnosis present

## 2021-11-17 DIAGNOSIS — C50311 Malignant neoplasm of lower-inner quadrant of right female breast: Secondary | ICD-10-CM | POA: Diagnosis present

## 2021-11-17 DIAGNOSIS — C7951 Secondary malignant neoplasm of bone: Secondary | ICD-10-CM | POA: Diagnosis present

## 2021-11-17 DIAGNOSIS — C787 Secondary malignant neoplasm of liver and intrahepatic bile duct: Secondary | ICD-10-CM | POA: Diagnosis present

## 2021-11-17 DIAGNOSIS — Z7984 Long term (current) use of oral hypoglycemic drugs: Secondary | ICD-10-CM | POA: Diagnosis not present

## 2021-11-17 DIAGNOSIS — Z8249 Family history of ischemic heart disease and other diseases of the circulatory system: Secondary | ICD-10-CM | POA: Diagnosis not present

## 2021-11-17 DIAGNOSIS — Z853 Personal history of malignant neoplasm of breast: Secondary | ICD-10-CM | POA: Diagnosis not present

## 2021-11-17 DIAGNOSIS — Z87891 Personal history of nicotine dependence: Secondary | ICD-10-CM | POA: Diagnosis not present

## 2021-11-17 DIAGNOSIS — N824 Other female intestinal-genital tract fistulae: Principal | ICD-10-CM | POA: Diagnosis present

## 2021-11-17 DIAGNOSIS — J449 Chronic obstructive pulmonary disease, unspecified: Secondary | ICD-10-CM | POA: Diagnosis present

## 2021-11-17 DIAGNOSIS — E0590 Thyrotoxicosis, unspecified without thyrotoxic crisis or storm: Secondary | ICD-10-CM | POA: Diagnosis present

## 2021-11-17 DIAGNOSIS — Z79899 Other long term (current) drug therapy: Secondary | ICD-10-CM | POA: Diagnosis not present

## 2021-11-17 DIAGNOSIS — E039 Hypothyroidism, unspecified: Secondary | ICD-10-CM | POA: Diagnosis present

## 2021-11-17 DIAGNOSIS — Z881 Allergy status to other antibiotic agents status: Secondary | ICD-10-CM | POA: Diagnosis not present

## 2021-11-17 DIAGNOSIS — Z9049 Acquired absence of other specified parts of digestive tract: Secondary | ICD-10-CM | POA: Diagnosis not present

## 2021-11-17 DIAGNOSIS — Z923 Personal history of irradiation: Secondary | ICD-10-CM | POA: Diagnosis not present

## 2021-11-17 DIAGNOSIS — E785 Hyperlipidemia, unspecified: Secondary | ICD-10-CM | POA: Diagnosis present

## 2021-11-17 DIAGNOSIS — Z882 Allergy status to sulfonamides status: Secondary | ICD-10-CM | POA: Diagnosis not present

## 2021-11-17 DIAGNOSIS — Z9011 Acquired absence of right breast and nipple: Secondary | ICD-10-CM | POA: Diagnosis not present

## 2021-11-17 DIAGNOSIS — N823 Fistula of vagina to large intestine: Secondary | ICD-10-CM | POA: Diagnosis present

## 2021-11-17 DIAGNOSIS — Z9851 Tubal ligation status: Secondary | ICD-10-CM | POA: Diagnosis not present

## 2021-11-17 DIAGNOSIS — Z888 Allergy status to other drugs, medicaments and biological substances status: Secondary | ICD-10-CM | POA: Diagnosis not present

## 2021-11-17 LAB — GLUCOSE, CAPILLARY
Glucose-Capillary: 146 mg/dL — ABNORMAL HIGH (ref 70–99)
Glucose-Capillary: 164 mg/dL — ABNORMAL HIGH (ref 70–99)
Glucose-Capillary: 191 mg/dL — ABNORMAL HIGH (ref 70–99)
Glucose-Capillary: 231 mg/dL — ABNORMAL HIGH (ref 70–99)

## 2021-11-17 LAB — COMPREHENSIVE METABOLIC PANEL
ALT: 18 U/L (ref 0–44)
AST: 24 U/L (ref 15–41)
Albumin: 3.2 g/dL — ABNORMAL LOW (ref 3.5–5.0)
Alkaline Phosphatase: 123 U/L (ref 38–126)
Anion gap: 9 (ref 5–15)
BUN: 13 mg/dL (ref 6–20)
CO2: 25 mmol/L (ref 22–32)
Calcium: 9.1 mg/dL (ref 8.9–10.3)
Chloride: 104 mmol/L (ref 98–111)
Creatinine, Ser: 0.59 mg/dL (ref 0.44–1.00)
GFR, Estimated: >60 mL/min (ref >60)
Glucose, Bld: 222 mg/dL — ABNORMAL HIGH (ref 70–99)
Potassium: 4 mmol/L (ref 3.5–5.1)
Sodium: 138 mmol/L (ref 135–145)
Total Bilirubin: 0.5 mg/dL (ref 0.3–1.2)
Total Protein: 6.7 g/dL (ref 6.5–8.1)

## 2021-11-17 LAB — CBC WITH DIFFERENTIAL/PLATELET
Abs Immature Granulocytes: 0.08 10*3/uL — ABNORMAL HIGH (ref 0.00–0.07)
Basophils Absolute: 0.1 10*3/uL (ref 0.0–0.1)
Basophils Relative: 1 %
Eosinophils Absolute: 0.2 10*3/uL (ref 0.0–0.5)
Eosinophils Relative: 2 %
HCT: 42.1 % (ref 36.0–46.0)
Hemoglobin: 13.8 g/dL (ref 12.0–15.0)
Immature Granulocytes: 1 %
Lymphocytes Relative: 12 %
Lymphs Abs: 0.8 10*3/uL (ref 0.7–4.0)
MCH: 31.3 pg (ref 26.0–34.0)
MCHC: 32.8 g/dL (ref 30.0–36.0)
MCV: 95.5 fL (ref 80.0–100.0)
Monocytes Absolute: 0.9 10*3/uL (ref 0.1–1.0)
Monocytes Relative: 13 %
Neutro Abs: 4.5 10*3/uL (ref 1.7–7.7)
Neutrophils Relative %: 71 %
Platelets: 311 10*3/uL (ref 150–400)
RBC: 4.41 MIL/uL (ref 3.87–5.11)
RDW: 12.8 % (ref 11.5–15.5)
WBC: 6.5 10*3/uL (ref 4.0–10.5)
nRBC: 0 % (ref 0.0–0.2)

## 2021-11-17 LAB — PROTIME-INR
INR: 1 (ref 0.8–1.2)
Prothrombin Time: 13 seconds (ref 11.4–15.2)

## 2021-11-17 LAB — MAGNESIUM: Magnesium: 2.2 mg/dL (ref 1.7–2.4)

## 2021-11-17 MED ORDER — INSULIN ASPART 100 UNIT/ML IJ SOLN
0.0000 [IU] | INTRAMUSCULAR | Status: DC
  Administered 2021-11-17: 2 [IU] via SUBCUTANEOUS
  Administered 2021-11-17 (×2): 1 [IU] via SUBCUTANEOUS
  Administered 2021-11-18 (×2): 2 [IU] via SUBCUTANEOUS
  Administered 2021-11-18: 3 [IU] via SUBCUTANEOUS
  Administered 2021-11-18: 1 [IU] via SUBCUTANEOUS

## 2021-11-17 MED ORDER — IPRATROPIUM-ALBUTEROL 0.5-2.5 (3) MG/3ML IN SOLN
3.0000 mL | Freq: Three times a day (TID) | RESPIRATORY_TRACT | Status: DC
  Administered 2021-11-17 – 2021-11-18 (×3): 3 mL via RESPIRATORY_TRACT
  Filled 2021-11-17 (×4): qty 3

## 2021-11-17 MED ORDER — IPRATROPIUM-ALBUTEROL 0.5-2.5 (3) MG/3ML IN SOLN
3.0000 mL | Freq: Four times a day (QID) | RESPIRATORY_TRACT | Status: DC
  Administered 2021-11-17: 3 mL via RESPIRATORY_TRACT
  Filled 2021-11-17: qty 3

## 2021-11-17 MED ORDER — ONDANSETRON HCL 4 MG/2ML IJ SOLN: 4.0000 mg | Freq: Four times a day (QID) | INTRAMUSCULAR | Status: DC | PRN

## 2021-11-17 MED ORDER — PIPERACILLIN-TAZOBACTAM 3.375 G IVPB
3.3750 g | Freq: Three times a day (TID) | INTRAVENOUS | Status: DC
  Administered 2021-11-17 – 2021-11-18 (×5): 3.375 g via INTRAVENOUS
  Filled 2021-11-17 (×5): qty 50

## 2021-11-17 MED ORDER — LACTATED RINGERS IV SOLN: INTRAVENOUS | Status: AC

## 2021-11-17 NOTE — Progress Notes (Signed)
  Carryover admission to the Day Admitter.  I discussed this case with the EDP, Quincy Carnes, PA.  Per these discussions:   This is a 31 F with metastatic breast cancer to bone/liver, who is being admitted with new dx of colovaginal fistula after presenting with 1 month of fecal material from the vagina. EDP d/w gen surg (Dr. Barry Dienes) who conveyed that gen surg to formally consult and see the patient in the morning.  Specifically, it will be Dr. Marcello Moores of Colorectal surgery (Dr. Barry Dienes to let Dr. Marcello Moores know of this consult).  Additionally, Dr. Barry Dienes requested that the patient be placed on antibiotics. Started on zosyn in ED, which I have continued. NPO. Follows with Dr. Lindi Adie of oncology. I added Dr. Lindi Adie to treatment team.  I have placed an order for inpatient admit to med-surg for the above.  I have placed some additional preliminary admit orders via the adult multi-morbid admission order set. I have also ordered continuous LR at 75 cc/hr x 8 hours. Prn zofran, INR.     Babs Bertin, DO Hospitalist

## 2021-11-17 NOTE — Chronic Care Management (AMB) (Signed)
  Care Coordination Note  11/17/2021 Name: Emily Wilson MRN: [id number] DOB: 11-Oct-1965  Emily Wilson is a 56 y.o. year old female who is a primary care patient of Dorna Mai, MD and is actively engaged with the care management team. I reached out to Emily Wilson by phone today to assist with re-scheduling an initial visit with the RN Case Manager  Follow up plan: Unsuccessful telephone outreach attempt made. A HIPAA compliant phone message was left for the patient providing contact information and requesting a return call.  The care management team will reach out to the patient again over the next 7 days.  If patient returns call to provider office, please advise to call [place]  [facility] [place], [facility]  [place], [place] [address] Direct Dial: [phone] [email or web address]

## 2021-11-17 NOTE — Telephone Encounter (Signed)
Patient is currently admitted and received imaging as outlined.

## 2021-11-17 NOTE — Progress Notes (Signed)
Mobility Specialist - Progress Note   11/17/21 0930  Mobility  HOB Elevated/Bed Position Self regulated  Activity Ambulated independently in hallway  Range of Motion/Exercises Active  Level of Assistance Independent  Assistive Device None  Distance Ambulated (ft) 500 ft  Activity Response Tolerated well  $Mobility charge 1 Mobility   Pt was found in bed and agreeable to ambulate. Had no complaints during ambulation and at EOS returned to EOB with all necessities in reach and RN in room.  Ferd Hibbs Mobility Specialist

## 2021-11-17 NOTE — Progress Notes (Signed)
Pharmacy Antibiotic Note  Emily Wilson is a 56 y.o. female admitted on 11/16/2021 with  new colo-vaginal fistula at risk for infection .  Pharmacy has been consulted for Zosyn dosing.  Plan: Zosyn 3.375g IV q8h (4 hour infusion). No dose adjustments anticipated.  Pharmacy will sign off and monitor peripherally via electronic surveillance software for any changes in renal function or micro data.   Height: '4\' 11"'$  (149.9 cm) Weight: 70.8 kg (156 lb) IBW/kg (Calculated) : 43.2  Temp (24hrs), Avg:98.3 F (36.8 C), Min:98.2 F (36.8 C), Max:98.4 F (36.9 C)  Recent Labs  Lab 11/16/21 1540  WBC 7.3  CREATININE 0.67  LATICACIDVEN 1.4    Estimated Creatinine Clearance: 68 mL/min (by C-G formula based on SCr of 0.67 mg/dL).    Allergies  Allergen Reactions   Doxycycline Shortness Of Breath    Wheezing, shortness of breath, rash head to toe, and swelling   Naproxen Shortness Of Breath   Oxycodone-Acetaminophen Itching   Codeine Hives   Hydrocodone-Acetaminophen Hives   Ibuprofen     Irritant to stomach r/t diverticulitis   Lantus [Insulin Glargine]     Yeast Infections   Meloxicam Other (See Comments)    Abdominal pain    Propoxyphene N-Acetaminophen Hives   Sulfa Antibiotics Hives   Sulfonamide Derivatives Hives   Tramadol Other (See Comments)    Abdominal Pain     Thank you for allowing pharmacy to be a part of this patient's care.  Netta Cedars PharmD 11/17/2021 12:53 AM

## 2021-11-17 NOTE — Progress Notes (Signed)
  Transition of Care (TOC) Screening Note   Patient Details  Name: Emily Wilson Date of Birth: 07/12/1965   Transition of Care [facility]) CM/SW Contact:    Lennart Pall, LCSW Phone Number: 11/17/2021, 11:05 AM    Transition of Care Department [facility]) has reviewed patient and no TOC needs have been identified at this time. We will continue to monitor patient advancement through interdisciplinary progression rounds. If new patient transition needs arise, please place a TOC consult.

## 2021-11-17 NOTE — H&P (Addendum)
History and Physical    Emily Wilson:[id number] DOB: Jun 03, 1965 DOA: 11/16/2021  PCP: Dorna Mai, MD  Patient coming from: home  I have personally briefly reviewed patient's old medical records in [place]  Chief Complaint: feculent discharge from vagina  HPI: Emily Wilson is a 56 y.o. female with medical history significant for anxiety, DMII, diverticulosis with hx of diverticulitis HLD, hypothyroidism, COPD,metastatic BRCA on pallative radiation anastrozole and Verizino, with interim hx of 1st episode of diverticulitis 8/9 for which she was treated with antibiotics with resolution. Patient now presents to ed with complaint of feculent discharge from vagina.Per patient states this started around one month ago where she would note small amount of feculent discharge on her underwear, however two days ago she noted discharge of frank stool from her vagina. She notes that these symptoms have been associated with lower abdominal pain. She also notes intermittent nausea but no emesis. She denies fever but has noted night sweats. No sob/ cough / noted decrease appetite.  ED Course:  Afeb, bp `125/79, hr 85, rr 18  sat 96% on ra  UA+, wbc>50 Wbc 7.3, hgb 14.1, plt 365  N a 133, K 3.9, glu 188, cr 0.67lipase 111 EKG : NSR   Ctabd/pelvis  IMPRESSION: Several hypodensities in the liver, many of which are new since prior study. Findings concerning for worsening metastases.   Concern for 2 fistulae between the sigmoid colon and vaginal cuff, best seen on coronal and sagittal imaging.   Colonic diverticulosis. Tx zosyn   Review of Systems: As per HPI otherwise 10 point review of systems negative.   Past Medical History:  Diagnosis Date   Anxiety state, unspecified    Arthritis    Breast cancer of lower-inner quadrant of right female breast [facility])    Carpal tunnel syndrome    Complication of anesthesia    woke up during ganglion cyst removal in her 20's, not woken up  since   Diabetes mellitus without complication (HCC)    Type II   Diverticulosis    Heart murmur    History of radiation therapy 12/18/18- 01/30/19   Right Chest wall 25 fractions X 2Gy each to total 50 Gy, followed by a boost 10 Gy in 5 fractions.    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, without mention of thyrotoxic crisis or storm     Past Surgical History:  Procedure Laterality Date   ABDOMINAL HYSTERECTOMY     ANTERIOR CRUCIATE LIGAMENT REPAIR Right 2007   ACL repair   APPENDECTOMY     CARPAL TUNNEL RELEASE Right    GANGLION CYST EXCISION Right    TOTAL MASTECTOMY Right 11/16/2018   Procedure: RIGHT MASTECTOMY;  Surgeon: Coralie Keens, MD;  Location: [facility];  Service: General;  Laterality: Right;   TUBAL LIGATION       reports that she quit smoking about 10 years ago. Her smoking use included cigarettes. She has a 90.00 pack-year smoking history. She has never used smokeless tobacco. She reports that she does not drink alcohol and does not use drugs.  Allergies  Allergen Reactions   Doxycycline Shortness Of Breath    Wheezing, shortness of breath, rash head to toe, and swelling   Naproxen Shortness Of Breath   Oxycodone-Acetaminophen Itching   Codeine Hives   Hydrocodone-Acetaminophen Hives  Ibuprofen     Irritant to stomach r/t diverticulitis   Lantus [Insulin Glargine]     Yeast Infections   Meloxicam Other (See Comments)    Abdominal pain    Propoxyphene N-Acetaminophen Hives   Sulfa Antibiotics Hives   Sulfonamide Derivatives Hives   Tramadol Other (See Comments)    Abdominal Pain    Family History  Problem Relation Age of Onset   Diabetes Mother    Bladder Cancer Mother 14       smoker   Other Mother 25       TAH for unspecified reason   Multiple sclerosis Mother    Kidney failure Mother    Cancer Father 21        lymphatic/tonsil cancer - in remission; former smoker   Diabetes Sister    Diabetes Sister    Heart attack Brother    COPD Maternal Grandmother        smoker   Emphysema Maternal Grandmother        smoker   Diabetes Maternal Grandmother    Stroke Maternal Grandfather    Diabetes Paternal Grandmother    Dementia Maternal Uncle    COPD Maternal Uncle        smoker   Multiple sclerosis Paternal Aunt    Breast cancer Paternal Aunt        dx. late 1s - early 68s    Prior to Admission medications   Medication Sig Start Date End Date Taking? Authorizing Provider  bismuth subsalicylate (PEPTO BISMOL) 262 MG/15ML suspension Take 30 mLs by mouth every 6 (six) hours as needed.   Yes [provider]  denosumab (XGEVA) 120 MG/1.7ML SOLN injection Inject 120 mg into the skin every 3 (three) months.   Yes [provider]  diclofenac (VOLTAREN) 75 MG EC tablet TAKE 1 TABLET BY MOUTH 2 TIMES DAILY AS NEEDED. Patient taking differently: Take 75 mg by mouth 2 (two) times daily as needed for mild pain or moderate pain. 08/10/21  Yes [name], MD  gabapentin (NEURONTIN) 100 MG capsule Take 3 capsules (300 mg total) by mouth 3 (three) times daily. 02/06/19  Yes [name], MD  HYDROmorphone (DILAUDID) 4 MG tablet Take 1 tablet (4 mg total) by mouth 4 (four) times daily as needed for severe pain. 10/28/21  Yes Eppie Gibson, MD  insulin NPH Human (HUMULIN N) 100 UNIT/ML injection INJECT 12 UNITS INTO THE SKIN AT BEDTIME 07/23/20  Yes [name], MD  insulin regular (NOVOLIN R) 100 units/mL injection INJECT 12 UNITS TOTAL INTO THE SKIN 3X DAILY BEFORE MEALS. HOLD DOSE FOR BLOOD SUGAR LESS THAN 125 11/02/21  Yes Dorna Mai, MD  Ipratropium-Albuterol (COMBIVENT RESPIMAT) 20-100 MCG/ACT AERS respimat Inhale 1 puff into the lungs every 6 (six) hours. 11/10/21  Yes Nyoka Lint, PA-C  loperamide (IMODIUM) 2 MG capsule Take 2 mg by mouth as needed for diarrhea or loose stools.  Take 2 tabs (4 mg) with first loose stool, then 1 tab (2 mg) with each additional loose stool. Do not take more than 8 tabs (16 mg) in a 24-hour period.   Yes [name], MD  metoprolol succinate (TOPROL-XL) 25 MG 24 hr tablet Take 1 tablet (25 mg total) by mouth daily. 04/14/21  Yes [name], MD  ondansetron (ZOFRAN ODT) 8 MG disintegrating tablet Take 1 tablet (8 mg total) by mouth every 8 (eight) hours as needed for nausea or vomiting. 04/20/21  Yes [name], MD  Accu-Chek FastClix Lancets  MISC USE TO TEST BLOOD SUGAR UP TO FOUR TIMES DAILY AS DIRECTED 11/02/21   Dorna Mai, MD  albuterol (VENTOLIN HFA) 108 (90 Base) MCG/ACT inhaler Inhale 1-2 puffs into the lungs every 6 (six) hours as needed for wheezing or shortness of breath. Patient not taking: Reported on 11/17/2021 11/10/21   Nyoka Lint, PA-C  alpelisib 200 mg daily dose (PIQRAY, 200 MG DAILY DOSE,) tablet Take 1 tablet (200 mg total) by mouth daily. Take with food daily. 11/04/21   [name], MD  azithromycin (ZITHROMAX) 250 MG tablet Take 1 tablet (250 mg total) by mouth daily. Take first 2 tablets together, then 1 every day until finished. Patient not taking: Reported on 11/17/2021 11/10/21   Nyoka Lint, PA-C  BD INSULIN SYRINGE U/F 31G X 5/16" 0.5 ML MISC USE TO ADMINISTER INSULIN 3 TIMES DAILY WITH MEALS 09/07/21   Dorna Mai, MD  dicyclomine (BENTYL) 20 MG tablet Take 1 tablet (20 mg total) by mouth every 6 (six) hours. Patient not taking: Reported on 11/17/2021 05/20/21   Dorna Mai, MD  glucose blood (ACCU-CHEK GUIDE) test strip USE TO CHECK BLOOD SUGAR UP TO 4 TIMES A DAY (E11.65) 07/31/21   Dorna Mai, MD  letrozole [facility]) 2.5 MG tablet TAKE 1 TABLET BY MOUTH EVERY DAY Patient taking differently: Take 2.5 mg by mouth daily. 10/30/20   [name], MD  meclizine (ANTIVERT) 25 MG tablet Take 1 tablet (25 mg total) by mouth 3 (three) times daily as needed for dizziness. Patient not taking: Reported on  11/17/2021 04/30/21   Francene Finders, PA-C  PEG-KCl-NaCl-NaSulf-Na Asc-C (PLENVU) 140 g SOLR Take 1 kit by mouth as directed. Use coupon: BIN: [id number] [facility]: CNRX Group: [id number] ID: [id number] Patient not taking: Reported on 11/17/2021 06/09/21   Doran Stabler, MD  predniSONE (DELTASONE) 10 MG tablet Take 1 tablet (10 mg total) by mouth in the morning, at noon, and at bedtime. Patient not taking: Reported on 11/17/2021 11/10/21   Nyoka Lint, PA-C  rosuvastatin (CRESTOR) 40 MG tablet TAKE 1 TABLET BY MOUTH EVERY DAY Patient not taking: Reported on 11/17/2021 10/09/21   Dorna Mai, MD    Physical Exam: Vitals:   11/17/21 0100 11/17/21 0139 11/17/21 0624 11/17/21 0932  BP: 125/80 115/77 114/70 115/85  Pulse: 94 85 91 97  Resp: '16 18 18   ' Temp: 98.2 F (36.8 C) 98.1 F (36.7 C) 98.7 F (37.1 C) 98.1 F (36.7 C)  TempSrc: Oral Oral Oral Oral  SpO2: 98% 97% 95% 97%  Weight:      Height:         Vitals:   11/17/21 0100 11/17/21 0139 11/17/21 0624 11/17/21 0932  BP: 125/80 115/77 114/70 115/85  Pulse: 94 85 91 97  Resp: '16 18 18   ' Temp: 98.2 F (36.8 C) 98.1 F (36.7 C) 98.7 F (37.1 C) 98.1 F (36.7 C)  TempSrc: Oral Oral Oral Oral  SpO2: 98% 97% 95% 97%  Weight:      Height:      Constitutional: NAD, calm, comfortable Eyes: PERRL, lids and conjunctivae normal ENMT: Mucous membranes are moist. Posterior pharynx clear of any exudate or lesions.Normal dentition.  Neck: normal, supple, no masses, no thyromegaly Respiratory: clear to auscultation bilaterally, no wheezing, no crackles. Normal respiratory effort. No accessory muscle use.  Cardiovascular: Regular rate and rhythm, no murmurs / rubs / gallops. No extremity edema. 2+ pedal pulses Abdomen:  left lower quad tenderness, no masses palpated. No hepatosplenomegaly. Bowel sounds  positive.  Musculoskeletal: no clubbing / cyanosis. No joint deformity upper and lower extremities. Good ROM, no contractures. Normal muscle  tone.  Skin: no rashes, lesions, ulcers. No induration Neurologic: CN 2-12 grossly intact. Sensation intact, Strength 5/5 in all 4.  Psychiatric: Normal judgment and insight. Alert and oriented x 3. Normal mood.    Labs on Admission: I have personally reviewed following labs and imaging studies  CBC: Recent Labs  Lab 11/16/21 1540 11/17/21 0447  WBC 7.3 6.5  NEUTROABS 5.2 4.5  HGB 14.1 13.8  HCT 41.7 42.1  MCV 94.8 95.5  PLT 365 389   Basic Metabolic Panel: Recent Labs  Lab 11/16/21 1540 11/17/21 0447  NA 133* 138  K 3.9 4.0  CL 101 104  CO2 22 25  GLUCOSE 188* 222*  BUN 16 13  CREATININE 0.67 0.59  CALCIUM 8.6* 9.1  MG  --  2.2   GFR: Estimated Creatinine Clearance: 68 mL/min (by C-G formula based on SCr of 0.59 mg/dL). Liver Function Tests: Recent Labs  Lab 11/16/21 1540 11/17/21 0447  AST 24 24  ALT 15 18  ALKPHOS 122 123  BILITOT 0.6 0.5  PROT 7.1 6.7  ALBUMIN 3.4* 3.2*   Recent Labs  Lab 11/16/21 1540  LIPASE 111*   No results for input(s): "AMMONIA" in the last 168 hours. Coagulation Profile: Recent Labs  Lab 11/17/21 0447  INR 1.0   Cardiac Enzymes: No results for input(s): "CKTOTAL", "CKMB", "CKMBINDEX", "TROPONINI" in the last 168 hours. BNP (last 3 results) No results for input(s): "PROBNP" in the last 8760 hours. HbA1C: No results for input(s): "HGBA1C" in the last 72 hours. CBG: No results for input(s): "GLUCAP" in the last 168 hours. Lipid Profile: No results for input(s): "CHOL", "HDL", "LDLCALC", "TRIG", "CHOLHDL", "LDLDIRECT" in the last 72 hours. Thyroid Function Tests: No results for input(s): "TSH", "T4TOTAL", "FREET4", "T3FREE", "THYROIDAB" in the last 72 hours. Anemia Panel: No results for input(s): "VITAMINB12", "FOLATE", "FERRITIN", "TIBC", "IRON", "RETICCTPCT" in the last 72 hours. Urine analysis:    Component Value Date/Time   COLORURINE YELLOW 11/16/2021 1530   APPEARANCEUR CLEAR 11/16/2021 1530   APPEARANCEUR  Clear 08/20/2019 1447   LABSPEC 1.022 11/16/2021 1530   PHURINE 5.0 11/16/2021 1530   GLUCOSEU >=500 (A) 11/16/2021 1530   HGBUR NEGATIVE 11/16/2021 1530   BILIRUBINUR NEGATIVE 11/16/2021 1530   BILIRUBINUR negative 09/29/2021 1337   BILIRUBINUR Negative 08/20/2019 1447   KETONESUR NEGATIVE 11/16/2021 1530   PROTEINUR NEGATIVE 11/16/2021 1530   UROBILINOGEN 0.2 09/29/2021 1337   UROBILINOGEN 0.2 02/28/2018 1743   NITRITE NEGATIVE 11/16/2021 1530   LEUKOCYTESUR SMALL (A) 11/16/2021 1530    Radiological Exams on Admission: CT Abdomen Pelvis W Contrast  Result Date: 11/16/2021 CLINICAL DATA:  concern for fistula (passing blood/stool/purulent material from vagina EXAM: CT ABDOMEN AND PELVIS WITH CONTRAST TECHNIQUE: Multidetector CT imaging of the abdomen and pelvis was performed using the standard protocol following bolus administration of intravenous contrast. RADIATION DOSE REDUCTION: This exam was performed according to the departmental dose-optimization program which includes automated exposure control, adjustment of the mA and/or kV according to patient size and/or use of iterative reconstruction technique. CONTRAST:  179m OMNIPAQUE IOHEXOL 300 MG/ML  SOLN COMPARISON:  09/30/2021 FINDINGS: Lower chest: No acute abnormality Hepatobiliary: Enhancing lesion in the lateral segment of the left hepatic lobe measuring 1.6 cm is stable since prior study. Small subcentimeter hypodensities in the hepatic dome best seen on coronal imaging. These are new since prior study. Cannot exclude small  metastases. Low-density lesion in the right hepatic lobe on coronal image 30 series 5 measures 11 mm, also new since prior study concerning for metastasis. Small low-density lesion in the inferior right hepatic lobe on image 33 of series 2. Gallbladder unremarkable. Pancreas: No focal abnormality or ductal dilatation. Spleen: No focal abnormality.  Normal size. Adrenals/Urinary Tract: No adrenal abnormality. No focal  renal abnormality. No stones or hydronephrosis. Urinary bladder is unremarkable. Stomach/Bowel: Rectal tube in place. Sigmoid diverticulosis and right colonic diverticulosis. No active diverticulitis. Stomach and small bowel decompressed. Vascular/Lymphatic: Aortic atherosclerosis. No evidence of aneurysm or adenopathy. Reproductive: Prior hysterectomy. No adnexal masses. On coronal images 64-67, there is concern for fistulous communication from the sigmoid colon to the vaginal cuff. A few locules of gas seen within the vagina. On sagittal image 77, there is possible 2 fistulae from the sigmoid colon to the vaginal cuff. No definite contrast within the vagina. Other: No free fluid or free air. 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 IMPRESSION: Several hypodensities in the liver, many of which are new since prior study. Findings concerning for worsening metastases. Concern for 2 fistulae between the sigmoid colon and vaginal cuff, best seen on coronal and sagittal imaging. Colonic diverticulosis. Electronically Signed   By: Rolm Baptise M.D.   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 -patient has decided to  forego any further chemo therapy and would like to proceed with surgery in 3 mo  -patient will need gi  consult as out patient   Metastatic BRCA -ct with new liver lesions suggestion progression of metastatic disease -on palliative radiation/ chemo  -oncology inpatient consult in light of patient decision to forego further chemo therapy     DMII -place on fs /iss while npo   COPD -no acute exacerbation -continue on prn nebs   HTN  -continue on metoprolol   HLD -resume statin     DVT prophylaxis: heparin Code Status: full Family Communication: none at bedside Disposition Plan: patient  expected to be admitted greater than 2 midnights  Consults called: Dr Marcello Moores Colorectal surgery Admission status: med tele   Clance Boll MD [facility]   If 7PM-7AM, please contact night-coverage [email or web address] Password [facility]  11/17/2021, 10:34 AM

## 2021-11-17 NOTE — Consult Note (Addendum)
Consult Note  Emily Wilson [birth year]  [id number].    Requesting MD: Dr. Velia Meyer Chief Complaint/Reason for Consult: colovaginal fistula  HPI:  56 y.o. female with medical history significant for metastatic breast cancer, type II DM on insulin, COPD, hypothyroidism who presented to [facility] emergency department with stool coming from her vagina.  She first noted gas passage per vagina about 1 month ago.  This progressed to discharge noted in her underwear and then frank stool over the last 2 days.  She has been on palliative radiation for treatment of her breast cancer as well as chemotherapy which she stopped about 1 month ago due to side effects.  She is followed by Dr. Lindi Adie and plan was to restart chemotherapy with a different agent yesterday.  Over the last month she has also been treated for a UTI, diverticulitis, and an upper respiratory infection.  UTI diagnosed 8/8, for which she completed Macrobid and Diflucan course.  She was diagnosed with URI at urgent care 9/19 and completed prednisone 10 mg course.  CT scan on 8/9 showed sigmoid diverticulitis and constipation and she completed course of Levaquin/Flagyl.  This is the first time she has been diagnosed with diverticulitis.  She has never had a colonoscopy before.  She has had suprapubic and bilateral lower abdominal pain over the past month.  Pain improves while on antibiotics and is exacerbated by certain foods.  She has had some intermittent nausea. Currently urinary symptoms remain resolved after antibiotics.  Abdominal pain is still present but very mild.  She also has mild aching pelvic pain. Denies recent fever or chills.  She has constipation with hard stools at baseline.   She is a former smoker.  Prior abdominal surgeries include abdominal hysterectomy and laparoscopic appendectomy.  ROS: Review of Systems  Constitutional:  Negative for chills and fever.  Respiratory:  Negative for cough and shortness of  breath.   Cardiovascular:  Negative for chest pain and palpitations.  Gastrointestinal:  Positive for abdominal pain, constipation, diarrhea and nausea. Negative for vomiting.  Genitourinary:  Negative for dysuria and hematuria.    Family History  Problem Relation Age of Onset   Diabetes Mother    Bladder Cancer Mother 36       smoker   Other Mother 67       TAH for unspecified reason   Multiple sclerosis Mother    Kidney failure Mother    Cancer Father 20       lymphatic/tonsil cancer - in remission; former smoker   Diabetes Sister    Diabetes Sister    Heart attack Brother    COPD Maternal Grandmother        smoker   Emphysema Maternal Grandmother        smoker   Diabetes Maternal Grandmother    Stroke Maternal Grandfather    Diabetes Paternal Grandmother    Dementia Maternal Uncle    COPD Maternal Uncle        smoker   Multiple sclerosis Paternal Aunt    Breast cancer Paternal Aunt        dx. late 45s - early 27s    Past Medical History:  Diagnosis Date   Anxiety state, unspecified    Arthritis    Breast cancer of lower-inner quadrant of right female breast ([place])    Carpal tunnel syndrome    Complication of anesthesia    woke up during ganglion cyst removal in her 20's, not woken up  since   Diabetes mellitus without complication ([place])    Type II   Diverticulosis    Heart murmur    History of radiation therapy 12/18/18- 01/30/19   Right Chest wall 25 fractions X 2Gy each to total 50 Gy, followed by a boost 10 Gy in 5 fractions.    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 Her smoking use included cigarettes. She has a 90.00 pack-year smoking history. She has never used smokeless tobacco. She reports that she does not drink alcohol and does not use drugs.  Allergies:  Allergies  Allergen Reactions   Doxycycline Shortness Of Breath    Wheezing, shortness of breath, rash head to toe, and swelling   Naproxen Shortness Of Breath   Oxycodone-Acetaminophen Itching   Codeine Hives   Hydrocodone-Acetaminophen Hives   Ibuprofen     Irritant to stomach r/t diverticulitis   Lantus [Insulin Glargine]     Yeast Infections   Meloxicam Other (See Comments)    Abdominal pain    Propoxyphene N-Acetaminophen Hives   Sulfa Antibiotics Hives   Sulfonamide Derivatives Hives   Tramadol Other (See Comments)    Abdominal Pain    Medications Prior to Admission  Medication Sig Dispense Refill   bismuth subsalicylate (PEPTO BISMOL) 262 MG/15ML suspension Take 30 mLs by mouth every 6 (six) hours as needed.     denosumab (XGEVA) 120 MG/1.7ML SOLN injection Inject 120 mg into the skin every 3 (three) months.     diclofenac (VOLTAREN) 75 MG EC tablet TAKE 1 TABLET BY MOUTH 2 TIMES DAILY AS NEEDED. (Patient taking differently: Take 75 mg by mouth 2 (two) times daily as needed for mild pain or moderate pain.) 60 tablet 2   gabapentin (NEURONTIN) 100 MG capsule Take 3 capsules (300 mg total) by mouth 3 (three) times daily. 90 capsule 1   HYDROmorphone (DILAUDID) 4 MG tablet Take 1 tablet (4 mg total) by mouth 4 (four) times daily as needed for severe pain. 20 tablet 0   insulin NPH Human (HUMULIN N) 100 UNIT/ML injection INJECT 12 UNITS INTO THE SKIN AT BEDTIME 10 mL 1   insulin regular (NOVOLIN R) 100 units/mL injection INJECT 12 UNITS  TOTAL INTO THE SKIN 3X DAILY BEFORE MEALS. HOLD DOSE FOR BLOOD SUGAR LESS THAN 125 30 mL 3   Ipratropium-Albuterol (COMBIVENT RESPIMAT) 20-100 MCG/ACT AERS respimat Inhale 1 puff into the lungs every 6 (six) hours. 4 g 1   loperamide (IMODIUM) 2 MG capsule Take 2 mg by mouth as needed for diarrhea or loose stools. Take 2 tabs (4 mg) with first loose stool, then 1 tab (2 mg) with each additional loose stool. Do not take more than 8 tabs (16 mg) in a 24-hour period.  metoprolol succinate (TOPROL-XL) 25 MG 24 hr tablet Take 1 tablet (25 mg total) by mouth daily. 90 tablet 3   ondansetron (ZOFRAN ODT) 8 MG disintegrating tablet Take 1 tablet (8 mg total) by mouth every 8 (eight) hours as needed for nausea or vomiting. 20 tablet 1   Accu-Chek FastClix Lancets MISC USE TO TEST BLOOD SUGAR UP TO FOUR TIMES DAILY AS DIRECTED 102 each 5   albuterol (VENTOLIN HFA) 108 (90 Base) MCG/ACT inhaler Inhale 1-2 puffs into the lungs every 6 (six) hours as needed for wheezing or shortness of breath. (Patient not taking: Reported on 11/17/2021) 8 g 1   alpelisib 200 mg daily dose (PIQRAY, 200 MG DAILY DOSE,) tablet Take 1 tablet (200 mg total) by mouth daily. Take with food daily. 28 tablet 0   azithromycin (ZITHROMAX) 250 MG tablet Take 1 tablet (250 mg total) by mouth daily. Take first 2 tablets together, then 1 every day until finished. (Patient not taking: Reported on 11/17/2021) 6 tablet 0   BD INSULIN SYRINGE U/F 31G X 5/16" 0.5 ML MISC USE TO ADMINISTER INSULIN 3 TIMES DAILY WITH MEALS 100 each 3   dicyclomine (BENTYL) 20 MG tablet Take 1 tablet (20 mg total) by mouth every 6 (six) hours. (Patient not taking: Reported on 11/17/2021) 120 tablet 0   glucose blood (ACCU-CHEK GUIDE) test strip USE TO CHECK BLOOD SUGAR UP TO 4 TIMES A DAY (E11.65) 200 strip 2   letrozole (FEMARA) 2.5 MG tablet TAKE 1 TABLET BY MOUTH EVERY DAY (Patient taking differently: Take 2.5 mg by mouth daily.) 90 tablet 3   meclizine (ANTIVERT) 25  MG tablet Take 1 tablet (25 mg total) by mouth 3 (three) times daily as needed for dizziness. (Patient not taking: Reported on 11/17/2021) 30 tablet 0   PEG-KCl-NaCl-NaSulf-Na Asc-C (PLENVU) 140 g SOLR Take 1 kit by mouth as directed. Use coupon: BIN: [id number] PNC: CNRX Group: [id number] ID: [id number] (Patient not taking: Reported on 11/17/2021) 1 each 0   predniSONE (DELTASONE) 10 MG tablet Take 1 tablet (10 mg total) by mouth in the morning, at noon, and at bedtime. (Patient not taking: Reported on 11/17/2021) 15 tablet 0   rosuvastatin (CRESTOR) 40 MG tablet TAKE 1 TABLET BY MOUTH EVERY DAY (Patient not taking: Reported on 11/17/2021) 90 tablet 0    Blood pressure 114/70, pulse 91, temperature 98.7 F (37.1 C), temperature source Oral, resp. rate 18, height _0  (1.499 m), weight 70.8 kg, SpO2 95 %. Physical Exam: General: pleasant, WD, female who is laying in bed in NAD HEENT: head is normocephalic, atraumatic.  Sclera are noninjected.  Pupils equal and round. EOMs intact.  Ears and nose without any masses or lesions.  Mouth is pink and moist Heart: regular, rate, and rhythm.  Normal s1,s2. No obvious murmurs, gallops, or rubs noted.  Palpable radial and pedal pulses bilaterally Lungs: CTAB, no wheezes, rhonchi, or rales noted.  Respiratory effort nonlabored Abd: soft, ND, +BS, very mild TTP across lower abdomen without rebound or guarding MSK: all 4 extremities are symmetrical with no cyanosis, clubbing, or edema. Skin: warm and dry with no masses, lesions, or rashes Neuro: Cranial nerves 2-12 grossly intact, sensation is normal throughout Psych: A&Ox3 with an appropriate affect.    Results for orders placed or performed during the hospital encounter of 11/16/21 (from the past 48 hour(s))  Urinalysis, Routine w reflex microscopic     Status: Abnormal   Collection Time: 11/16/21  3:30 PM  Result Value Ref  Range   Color, Urine YELLOW YELLOW   APPearance CLEAR CLEAR   Specific Gravity,  Urine 1.022 1.005 - 1.030   pH 5.0 5.0 - 8.0   Glucose, UA >=500 (A) NEGATIVE mg/dL   Hgb urine dipstick NEGATIVE NEGATIVE   Bilirubin Urine NEGATIVE NEGATIVE   Ketones, ur NEGATIVE NEGATIVE mg/dL   Protein, ur NEGATIVE NEGATIVE mg/dL   Nitrite NEGATIVE NEGATIVE   Leukocytes,Ua SMALL (A) NEGATIVE   RBC / HPF 0-5 0 - 5 RBC/hpf   WBC, UA >50 (H) 0 - 5 WBC/hpf   Bacteria, UA NONE SEEN NONE SEEN   Squamous Epithelial / LPF 0-5 0 - 5   Mucus PRESENT     Comment: Performed at [facility], [place] [address] [place], [place] [address]  CBC with Differential     Status: Abnormal   Collection Time: 11/16/21  3:40 PM  Result Value Ref Range   WBC 7.3 4.0 - 10.5 K/uL   RBC 4.40 3.87 - 5.11 MIL/uL   Hemoglobin 14.1 12.0 - 15.0 g/dL   HCT 41.7 36.0 - 46.0 %   MCV 94.8 80.0 - 100.0 fL   MCH 32.0 26.0 - 34.0 pg   MCHC 33.8 30.0 - 36.0 g/dL   RDW 12.7 11.5 - 15.5 %   Platelets 365 150 - 400 K/uL   nRBC 0.0 0.0 - 0.2 %   Neutrophils Relative % 72 %   Neutro Abs 5.2 1.7 - 7.7 K/uL   Lymphocytes Relative 12 %   Lymphs Abs 0.9 0.7 - 4.0 K/uL   Monocytes Relative 12 %   Monocytes Absolute 0.9 0.1 - 1.0 K/uL   Eosinophils Relative 2 %   Eosinophils Absolute 0.1 0.0 - 0.5 K/uL   Basophils Relative 1 %   Basophils Absolute 0.1 0.0 - 0.1 K/uL   Immature Granulocytes 1 %   Abs Immature Granulocytes 0.09 (H) 0.00 - 0.07 K/uL    Comment: Performed at [facility], [place] [address], [place], [place] [address]  Comprehensive metabolic panel     Status: Abnormal   Collection Time: 11/16/21  3:40 PM  Result Value Ref Range   Sodium 133 (L) 135 - 145 mmol/L   Potassium 3.9 3.5 - 5.1 mmol/L   Chloride 101 98 - 111 mmol/L   CO2 22 22 - 32 mmol/L   Glucose, Bld 188 (H) 70 - 99 mg/dL    Comment: Glucose reference range applies only to samples taken after fasting for at least 8 hours.   BUN 16 6 - 20 mg/dL   Creatinine, Ser 0.67 0.44 - 1.00 mg/dL   Calcium 8.6 (L) 8.9  - 10.3 mg/dL   Total Protein 7.1 6.5 - 8.1 g/dL   Albumin 3.4 (L) 3.5 - 5.0 g/dL   AST 24 15 - 41 U/L   ALT 15 0 - 44 U/L   Alkaline Phosphatase 122 38 - 126 U/L   Total Bilirubin 0.6 0.3 - 1.2 mg/dL   GFR, Estimated >60 >60 mL/min    Comment: (NOTE) Calculated using the CKD-EPI Creatinine Equation (2021)    Anion gap 10 5 - 15    Comment: Performed at [facility], [place] [address] [place], [place] [address]  Lipase, blood     Status: Abnormal   Collection Time: 11/16/21  3:40 PM  Result Value Ref Range   Lipase 111 (H) 11 - 51 U/L    Comment: Performed at [facility], [place]  [address] [place], [place] [address]  Lactic acid, plasma     Status: None   Collection Time: 11/16/21  3:40 PM  Result Value Ref Range   Lactic Acid, Venous 1.4 0.5 - 1.9 mmol/L    Comment: Performed at [facility], [place] [address], [place], [place] [address]  CBC with Differential/Platelet     Status: Abnormal   Collection Time: 11/17/21  4:47 AM  Result Value Ref Range   WBC 6.5 4.0 - 10.5 K/uL   RBC 4.41 3.87 - 5.11 MIL/uL   Hemoglobin 13.8 12.0 - 15.0 g/dL   HCT 42.1 36.0 - 46.0 %   MCV 95.5 80.0 - 100.0 fL   MCH 31.3 26.0 - 34.0 pg   MCHC 32.8 30.0 - 36.0 g/dL   RDW 12.8 11.5 - 15.5 %   Platelets 311 150 - 400 K/uL   nRBC 0.0 0.0 - 0.2 %   Neutrophils Relative % 71 %   Neutro Abs 4.5 1.7 - 7.7 K/uL   Lymphocytes Relative 12 %   Lymphs Abs 0.8 0.7 - 4.0 K/uL   Monocytes Relative 13 %   Monocytes Absolute 0.9 0.1 - 1.0 K/uL   Eosinophils Relative 2 %   Eosinophils Absolute 0.2 0.0 - 0.5 K/uL   Basophils Relative 1 %   Basophils Absolute 0.1 0.0 - 0.1 K/uL   Immature Granulocytes 1 %   Abs Immature Granulocytes 0.08 (H) 0.00 - 0.07 K/uL    Comment: Performed at [facility], [place] [address] [place], [place] [address]  Comprehensive metabolic panel     Status: Abnormal   Collection Time: 11/17/21  4:47 AM  Result  Value Ref Range   Sodium 138 135 - 145 mmol/L   Potassium 4.0 3.5 - 5.1 mmol/L   Chloride 104 98 - 111 mmol/L   CO2 25 22 - 32 mmol/L   Glucose, Bld 222 (H) 70 - 99 mg/dL    Comment: Glucose reference range applies only to samples taken after fasting for at least 8 hours.   BUN 13 6 - 20 mg/dL   Creatinine, Ser 0.59 0.44 - 1.00 mg/dL   Calcium 9.1 8.9 - 10.3 mg/dL   Total Protein 6.7 6.5 - 8.1 g/dL   Albumin 3.2 (L) 3.5 - 5.0 g/dL   AST 24 15 - 41 U/L   ALT 18 0 - 44 U/L   Alkaline Phosphatase 123 38 - 126 U/L   Total Bilirubin 0.5 0.3 - 1.2 mg/dL   GFR, Estimated >60 >60 mL/min    Comment: (NOTE) Calculated using the CKD-EPI Creatinine Equation (2021)    Anion gap 9 5 - 15    Comment: Performed at [facility], [place] [address], [place], [place] [address]  Magnesium     Status: None   Collection Time: 11/17/21  4:47 AM  Result Value Ref Range   Magnesium 2.2 1.7 - 2.4 mg/dL    Comment: Performed at [facility], [place] [address] [place], [place] [address]  Protime-INR     Status: None   Collection Time: 11/17/21  4:47 AM  Result Value Ref Range   Prothrombin Time 13.0 11.4 - 15.2 seconds   INR 1.0 0.8 - 1.2    Comment: (NOTE) INR goal varies based on device and disease states. Performed at [facility], [place] [address], [place], [place] [address]    CT Abdomen Pelvis W Contrast  Result Date: 11/16/2021 CLINICAL DATA:  concern for fistula (passing blood/stool/purulent material from vagina EXAM: CT ABDOMEN AND PELVIS WITH CONTRAST TECHNIQUE: Multidetector CT imaging of the abdomen and pelvis was performed using the standard protocol following bolus administration of intravenous contrast. RADIATION DOSE REDUCTION: This exam was performed according to the departmental dose-optimization program which includes automated exposure control, adjustment of the mA and/or kV according to patient size and/or use of iterative  reconstruction technique. CONTRAST:  142m OMNIPAQUE IOHEXOL 300 MG/ML  SOLN COMPARISON:  09/30/2021 FINDINGS: Lower chest: No acute abnormality Hepatobiliary: Enhancing lesion in the lateral segment of the left hepatic lobe measuring 1.6 cm is stable since prior study. Small subcentimeter hypodensities in the hepatic dome best seen on coronal imaging. These are new since prior study. Cannot exclude small metastases. Low-density lesion in the right hepatic lobe on coronal image 30 series 5 measures 11 mm, also new since prior study concerning for metastasis. Small low-density lesion in the inferior right hepatic lobe on image 33 of series 2. Gallbladder unremarkable. Pancreas: No focal abnormality or ductal dilatation. Spleen: No focal abnormality.  Normal size. Adrenals/Urinary Tract: No adrenal abnormality. No focal renal abnormality. No stones or hydronephrosis. Urinary bladder is unremarkable. Stomach/Bowel: Rectal tube in place. Sigmoid diverticulosis and right colonic diverticulosis. No active diverticulitis. Stomach and small bowel decompressed. Vascular/Lymphatic: Aortic atherosclerosis. No evidence of aneurysm or adenopathy. Reproductive: Prior hysterectomy. No adnexal masses. On coronal images 64-67, there is concern for fistulous communication from the sigmoid colon to the vaginal cuff. A few locules of gas seen within the vagina. On sagittal image 77, there is possible 2 fistulae from the sigmoid colon to the vaginal cuff. No definite contrast within the vagina. Other: No free fluid or free air. 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 IMPRESSION: Several hypodensities in the liver, many of which are new since prior study. Findings concerning for worsening metastases. Concern for 2 fistulae between the sigmoid colon and vaginal cuff, best seen on coronal and sagittal imaging. Colonic diverticulosis. Electronically Signed   By:  KRolm BaptiseM.D.   On: 11/16/2021 22:37      Assessment/Plan Metastatic breast cancer Diverticulosis - history of diverticulitis (completed course of levo/flagyl starting 8/15) Colovaginal fistula Patient seen and examined and relevant labs and imaging reviewed.  She has a symptomatic colovaginal fistula in setting of first episode of diverticulitis s/p treatment with levofloxacin/Flagyl.  She still has mild abdominal pain.  Potential surgical treatment is complicated by her underlying metastatic breast cancer and chemotherapy plans.  Options include acute surgical diversion with colostomy while off chemotherapy and she would need to remain off for approximately 1 month.  Alternatively could plan for her to remain off chemotherapy and undergo resection with primary anastomosis in the next 3 months with interval colonoscopy in the meantime.  Again, she would need to remain off of chemotherapy postoperatively for approximately 1 month.  At this time she is prioritizing management of her fistula as soon as possible due to symptoms.  Ultimately, recommend further evaluation by and discussion with her oncology team regarding chemotherapy management/prognosis. Continue antibiotics. Recommend ongoing constipation prevention with bowel regimen and fiber. No surgical intervention planned today.  She can have a diet from our standpoint. We will follow with you.  FEN: NPO ID: [id number] VTE: okay for chemical prophylaxis from our standpoint   I reviewed ED provider notes, hospitalist notes, last 24 h vitals and pain scores, last 48 h intake and output, last 24 h  labs and trends, and last 24 h imaging results.  Winferd Humphrey, [facility] 11/17/2021, 8:00 AM Please see Amion for pager number during day hours 7:00am-4:30pm

## 2021-11-18 ENCOUNTER — Encounter: Payer: Self-pay | Admitting: Hematology and Oncology

## 2021-11-18 DIAGNOSIS — Z170 Estrogen receptor positive status [ER+]: Secondary | ICD-10-CM

## 2021-11-18 DIAGNOSIS — C50311 Malignant neoplasm of lower-inner quadrant of right female breast: Secondary | ICD-10-CM

## 2021-11-18 DIAGNOSIS — Z9011 Acquired absence of right breast and nipple: Secondary | ICD-10-CM | POA: Diagnosis not present

## 2021-11-18 DIAGNOSIS — E119 Type 2 diabetes mellitus without complications: Secondary | ICD-10-CM

## 2021-11-18 DIAGNOSIS — C7951 Secondary malignant neoplasm of bone: Secondary | ICD-10-CM

## 2021-11-18 DIAGNOSIS — N824 Other female intestinal-genital tract fistulae: Secondary | ICD-10-CM | POA: Diagnosis not present

## 2021-11-18 DIAGNOSIS — K5730 Diverticulosis of large intestine without perforation or abscess without bleeding: Secondary | ICD-10-CM

## 2021-11-18 DIAGNOSIS — Z794 Long term (current) use of insulin: Secondary | ICD-10-CM

## 2021-11-18 DIAGNOSIS — F411 Generalized anxiety disorder: Secondary | ICD-10-CM

## 2021-11-18 LAB — GLUCOSE, CAPILLARY
Glucose-Capillary: 209 mg/dL — ABNORMAL HIGH (ref 70–99)
Glucose-Capillary: 162 mg/dL — ABNORMAL HIGH (ref 70–99)
Glucose-Capillary: 257 mg/dL — ABNORMAL HIGH (ref 70–99)
Glucose-Capillary: 217 mg/dL — ABNORMAL HIGH (ref 70–99)

## 2021-11-18 MED ORDER — AMOXICILLIN-POT CLAVULANATE 875-125 MG PO TABS: 1.0000 | ORAL_TABLET | Freq: Two times a day (BID) | ORAL | 0 refills | Status: AC

## 2021-11-18 NOTE — Discharge Summary (Signed)
Physician Discharge Summary  Emily Wilson JFH:[id number] DOB: [birth year] DOA: 11/16/2021  PCP: Dorna Mai, MD  Admit date: 11/16/2021 Discharge date: 11/18/2021 Admitted From: Home Disposition: Home Recommendations for Outpatient Follow-up:  Follow up with PCP in 1 to 2 weeks General surgery to arrange outpatient follow-up. Check BMP and CBC in 1 week Consider prophylactic antibiotic for UTI given colovaginal fistula Please follow up on the following pending results: None  Home Health: Not indicated Equipment/Devices: Not indicated  Discharge Condition: Stable CODE STATUS: Full code  Follow-up Information     Dorna Mai, MD. Schedule an appointment as soon as possible for a visit in 1 week(s).   Specialty: Family Medicine Contact information: [address] [place] [address] [phone]         Doran Stabler, MD. Call.   Specialty: Gastroenterology Why: call to schedule appointment for colonsocopy Contact information: [address] [place] [address] [phone]         Leighton Ruff, MD. Call.   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  CT also showed severe hypodensities in the liver concerning for worsening metastasis.  Patient was started on IV Zosyn and admitted.  General surgery consulted, and recommended oral antibiotic for 10 days  (Augmentin) with a plan to pursue elective resection and anastomosis in 2 to 3 months after discussion with oncology about her chemotherapy.  Patient tolerated soft diet prior to discharge.  She is discharged on p.o. Augmentin for 10 more days.   See individual problem list below for more.   Problems addressed during this hospitalization Principal Problem:   Colovaginal fistula Active Problems:   Anxiety state   Malignant neoplasm of lower-inner quadrant of right breast of female, estrogen receptor positive ([place])   DM (diabetes mellitus), type 2 (HCC)   Malignant neoplasm metastatic to bone [facility])   S/P mastectomy, right   Diverticulosis of colon              Vital signs Vitals:   11/17/21 2033 11/18/21 0522 11/18/21 0736 11/18/21 1422  BP: 134/83 115/81  126/83  Pulse: (!) 107 89  100  Temp: 98.8 F (37.1 C) 98.7 F (37.1 C)  97.9 F (36.6 C)  Resp: _0 Height:      Weight:      SpO2: 99% 95% 95% 98%  TempSrc: Oral Oral  Oral  BMI (Calculated):         Discharge exam  GENERAL: No apparent distress.  Nontoxic. HEENT: MMM.  Vision and hearing grossly intact.  NECK: Supple.  No apparent JVD.  RESP:  No IWOB.  Fair aeration bilaterally. CVS:  RRR. Heart sounds normal.  ABD/GI/GU: BS+. Abd soft, NTND.  MSK/EXT:  Moves extremities. No apparent deformity. No edema.  SKIN: no apparent skin lesion or wound NEURO: Awake and alert. Oriented appropriately.  No apparent focal neuro deficit. PSYCH: Calm. Normal affect.  Discharge Instructions Discharge  Instructions     Call MD for:  extreme fatigue   Complete by: As directed    Call MD for:  persistant dizziness or light-headedness   Complete by: As directed    Call MD for:  persistant nausea and vomiting   Complete by: As directed    Call MD for:  severe uncontrolled pain   Complete by: As directed    Call MD for:  temperature >100.4   Complete by: As directed    Diet - low sodium heart healthy   Complete  by: As directed    Soft diet   Discharge instructions   Complete by: As directed    It has been a pleasure taking care of you!  You were hospitalized due to colovaginal fistula.  We have started you on antibiotics.  We are discharging you on more antibiotics to complete treatment course.  Follow-up with your surgeon per their recommendation to discuss surgical option.  Follow directions or recommendations by your oncologist about your chemotherapy.  We also recommend follow-up with your primary care doctor in 1 to 2 weeks.  Review your new medication list and the directions on your medications before you take them.   Take care,   Increase activity slowly   Complete by: As directed       Allergies as of 11/18/2021       Reactions   Doxycycline Shortness Of Breath   Wheezing, shortness of breath, rash head to toe, and swelling   Naproxen Shortness Of Breath   Oxycodone-acetaminophen Itching   Codeine Hives   Hydrocodone-acetaminophen Hives   Ibuprofen    Irritant to stomach r/t diverticulitis   Lantus [insulin Glargine]    Yeast Infections   Meloxicam Other (See Comments)   Abdominal pain   Propoxyphene N-acetaminophen Hives   Sulfa Antibiotics Hives   Sulfonamide Derivatives Hives   Tramadol Other (See Comments)   Abdominal Pain        Medication List     STOP taking these medications    azithromycin 250 MG tablet Commonly known as: ZITHROMAX   predniSONE 10 MG tablet Commonly known as: DELTASONE       TAKE these medications    Accu-Chek FastClix Lancets Misc USE TO TEST BLOOD SUGAR UP TO FOUR TIMES DAILY AS DIRECTED   Accu-Chek Guide test strip Generic drug: glucose blood USE TO CHECK BLOOD SUGAR UP TO 4 TIMES A DAY (E11.65)   albuterol 108 (90 Base) MCG/ACT inhaler Commonly known as: VENTOLIN HFA Inhale 1-2 puffs into the lungs every 6 (six) hours as needed for wheezing or shortness of breath.   amoxicillin-clavulanate 875-125 MG tablet Commonly  known as: AUGMENTIN Take 1 tablet by mouth 2 (two) times daily for 10 days.   BD Insulin Syringe U/F 31G X 5/16" 0.5 ML Misc Generic drug: Insulin Syringe-Needle U-100 USE TO ADMINISTER INSULIN 3 TIMES DAILY WITH MEALS   bismuth subsalicylate 350 KX/38HW suspension Commonly known as: PEPTO BISMOL Take 30 mLs by mouth every 6 (six) hours as needed.   Combivent Respimat 20-100 MCG/ACT Aers respimat Generic drug: Ipratropium-Albuterol Inhale 1 puff into the lungs every 6 (six) hours.   diclofenac 75 MG EC tablet Commonly known as: VOLTAREN TAKE 1 TABLET BY MOUTH 2 TIMES DAILY AS NEEDED. What changed: See the new instructions.   dicyclomine 20 MG tablet Commonly known as: BENTYL Take 1 tablet (20 mg total) by mouth every 6 (six) hours.   gabapentin 100 MG capsule Commonly known as:  NEURONTIN Take 3 capsules (300 mg total) by mouth 3 (three) times daily.   HYDROmorphone 4 MG tablet Commonly known as: Dilaudid Take 1 tablet (4 mg total) by mouth 4 (four) times daily as needed for severe pain.   insulin NPH Human 100 UNIT/ML injection Commonly known as: HumuLIN N INJECT 12 UNITS INTO THE SKIN AT BEDTIME   insulin regular 100 units/mL injection Commonly known as: NovoLIN R INJECT 12 UNITS TOTAL INTO THE SKIN 3X DAILY BEFORE MEALS. HOLD DOSE FOR BLOOD SUGAR LESS THAN 125   letrozole 2.5 MG tablet Commonly known as: FEMARA TAKE 1 TABLET BY MOUTH EVERY DAY   loperamide 2 MG capsule Commonly known as: IMODIUM Take 2 mg by mouth as needed for diarrhea or loose stools. Take 2 tabs (4 mg) with first loose stool, then 1 tab (2 mg) with each additional loose stool. Do not take more than 8 tabs (16 mg) in a 24-hour period.   meclizine 25 MG tablet Commonly known as: ANTIVERT Take 1 tablet (25 mg total) by mouth 3 (three) times daily as needed for dizziness.   metoprolol succinate 25 MG 24 hr tablet Commonly known as: TOPROL-XL Take 1 tablet (25 mg total) by mouth daily.    ondansetron 8 MG disintegrating tablet Commonly known as: Zofran ODT Take 1 tablet (8 mg total) by mouth every 8 (eight) hours as needed for nausea or vomiting.   Piqray (200 MG Daily Dose) tablet Generic drug: alpelisib 200 mg daily dose Take 1 tablet (200 mg total) by mouth daily. Take with food daily.   Plenvu 140 g Solr Generic drug: PEG-KCl-NaCl-NaSulf-Na Asc-C Take 1 kit by mouth as directed. Use coupon: BIN: [id number] PNC: CNRX Group: [id number] ID: [id number]   rosuvastatin 40 MG tablet Commonly known as: CRESTOR TAKE 1 TABLET BY MOUTH EVERY DAY   Xgeva 120 MG/1.7ML Soln injection Generic drug: denosumab Inject 120 mg into the skin every 3 (three) months.        Consultations: General surgery  Procedures/Studies: None   CT Abdomen Pelvis W Contrast  Result Date: 11/16/2021 CLINICAL DATA:  concern for fistula (passing blood/stool/purulent material from vagina EXAM: CT ABDOMEN AND PELVIS WITH CONTRAST TECHNIQUE: Multidetector CT imaging of the abdomen and pelvis was performed using the standard protocol following bolus administration of intravenous contrast. RADIATION DOSE REDUCTION: This exam was performed according to the departmental dose-optimization program which includes automated exposure control, adjustment of the mA and/or kV according to patient size and/or use of iterative reconstruction technique. CONTRAST:  160m OMNIPAQUE IOHEXOL 300 MG/ML  SOLN COMPARISON:  09/30/2021 FINDINGS: Lower chest: No acute abnormality Hepatobiliary: Enhancing lesion in the lateral segment of the left hepatic lobe measuring 1.6 cm is stable since prior study. Small subcentimeter hypodensities in the hepatic dome best seen on coronal imaging. These are new since prior study. Cannot exclude small metastases. Low-density lesion in the right hepatic lobe on coronal image 30 series 5 measures 11 mm, also new since prior study concerning for metastasis. Small low-density lesion in the inferior  right hepatic lobe on image 33 of series 2. Gallbladder unremarkable. Pancreas: No focal abnormality or ductal dilatation. Spleen: No focal abnormality.  Normal size. Adrenals/Urinary Tract: No adrenal abnormality. No focal renal abnormality. No stones or hydronephrosis. Urinary bladder is unremarkable. Stomach/Bowel: Rectal tube in place. Sigmoid diverticulosis and right colonic diverticulosis. No active diverticulitis. Stomach and small bowel decompressed. Vascular/Lymphatic: Aortic atherosclerosis. No evidence of aneurysm or adenopathy. Reproductive: Prior hysterectomy. No adnexal masses. On coronal  images 64-67, there is concern for fistulous communication from the sigmoid colon to the vaginal cuff. A few locules of gas seen within the vagina. On sagittal image 77, there is possible 2 fistulae from the sigmoid colon to the vaginal cuff. No definite contrast within the vagina. Other: No free fluid or free air. 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 IMPRESSION: Several hypodensities in the liver, many of which are new since prior study. Findings concerning for worsening metastases. Concern for 2 fistulae between the sigmoid colon and vaginal cuff, best seen on coronal and sagittal imaging. Colonic diverticulosis. Electronically Signed   By: Rolm Baptise M.D.   On: 11/16/2021 22:37       The results of significant diagnostics from this hospitalization (including imaging, microbiology, ancillary and laboratory) are listed below for reference.     Microbiology: No results found for this or any previous visit (from the past 240 hour(s)).   Labs:  CBC: Recent Labs  Lab 11/16/21 1540 11/17/21 0447  WBC 7.3 6.5  NEUTROABS 5.2 4.5  HGB 14.1 13.8  HCT 41.7 42.1  MCV 94.8 95.5  PLT 365 311   BMP &GFR Recent Labs  Lab 11/16/21 1540 11/17/21 0447  NA 133* 138  K 3.9 4.0  CL 101 104  CO2 22 25  GLUCOSE 188* 222*  BUN  16 13  CREATININE 0.67 0.59  CALCIUM 8.6* 9.1  MG  --  2.2   Estimated Creatinine Clearance: 68 mL/min (by C-G formula based on SCr of 0.59 mg/dL). Liver & Pancreas: Recent Labs  Lab 11/16/21 1540 11/17/21 0447  AST 24 24  ALT 15 18  ALKPHOS 122 123  BILITOT 0.6 0.5  PROT 7.1 6.7  ALBUMIN 3.4* 3.2*   Recent Labs  Lab 11/16/21 1540  LIPASE 111*   No results for input(s): "AMMONIA" in the last 168 hours. Diabetic: No results for input(s): "HGBA1C" in the last 72 hours. Recent Labs  Lab 11/17/21 2346 11/18/21 0357 11/18/21 0739 11/18/21 1200 11/18/21 1629  GLUCAP 146* 209* 162* 257* 217*   Cardiac Enzymes: No results for input(s): "CKTOTAL", "CKMB", "CKMBINDEX", "TROPONINI" in the last 168 hours. No results for input(s): "PROBNP" in the last 8760 hours. Coagulation Profile: Recent Labs  Lab 11/17/21 0447  INR 1.0   Thyroid Function Tests: No results for input(s): "TSH", "T4TOTAL", "FREET4", "T3FREE", "THYROIDAB" in the last 72 hours. Lipid Profile: No results for input(s): "CHOL", "HDL", "LDLCALC", "TRIG", "CHOLHDL", "LDLDIRECT" in the last 72 hours. Anemia Panel: No results for input(s): "VITAMINB12", "FOLATE", "FERRITIN", "TIBC", "IRON", "RETICCTPCT" in the last 72 hours. Urine analysis:    Component Value Date/Time   COLORURINE YELLOW 11/16/2021 1530   APPEARANCEUR CLEAR 11/16/2021 1530   APPEARANCEUR Clear 08/20/2019 1447   LABSPEC 1.022 11/16/2021 1530   PHURINE 5.0 11/16/2021 1530   GLUCOSEU >=500 (A) 11/16/2021 1530   HGBUR NEGATIVE 11/16/2021 1530   BILIRUBINUR NEGATIVE 11/16/2021 1530   BILIRUBINUR negative 09/29/2021 1337   BILIRUBINUR Negative 08/20/2019 1447   KETONESUR NEGATIVE 11/16/2021 1530   PROTEINUR NEGATIVE 11/16/2021 1530   UROBILINOGEN 0.2 09/29/2021 1337   UROBILINOGEN 0.2 02/28/2018 1743   NITRITE NEGATIVE 11/16/2021 1530   LEUKOCYTESUR SMALL (A) 11/16/2021 1530   Sepsis Labs: Invalid input(s): "PROCALCITONIN",  "LACTICIDVEN"   SIGNED:  Mercy Riding, MD  [facility] 11/18/2021, 5:30 PM

## 2021-11-18 NOTE — Progress Notes (Signed)
Nurse reviewed discharge instructions with pt.  Pt verbalized understanding of discharge instructions, follow up appointments and new medications.  No concerns at time of discharge. 

## 2021-11-18 NOTE — Progress Notes (Signed)
Progress Note     Subjective: Some mild crampy abdominal pain with clear liquids but overall stable since prior to admission when she was eating a regular diet. She has had a low appetite. No nausea or emesis. Pelvic pain is better today.  She is very certain in her goals of care and states she wants to prioritize "quality of life over quantity" and therefore wants to pursue elective resection with primary anastomosis in 3 months knowing that she would need to be off of chemotherapy for a period of time  Significant other is bedside  Objective: Vital signs in last 24 hours: Temp:  [98.1 F (36.7 C)-98.8 F (37.1 C)] 98.7 F (37.1 C) (09/27 0522) Pulse Rate:  [89-107] 89 (09/27 0522) Resp:  [18] 18 (09/27 0522) BP: (112-134)/(75-85) 115/81 (09/27 0522) SpO2:  [95 %-99 %] 95 % (09/27 0736)    Intake/Output from previous day: 09/26 0701 - 09/27 0700 In: 1703.6 [P.O.:1320; I.V.:225; IV Piggyback:158.6] Out: -  Intake/Output this shift: No intake/output data recorded.  PE: General: pleasant, WD, female who is laying in bed in NAD Lungs: Respiratory effort nonlabored Abd: soft, ND, mild TTP across lower abdomen without rebound or guarding MSK: all 4 extremities are symmetrical with no cyanosis, clubbing, or edema. Skin: warm and dry with no masses, lesions, or rashes Psych: A&Ox3 with an appropriate affect.    Lab Results:  Recent Labs    11/16/21 1540 11/17/21 0447  WBC 7.3 6.5  HGB 14.1 13.8  HCT 41.7 42.1  PLT 365 311   BMET Recent Labs    11/16/21 1540 11/17/21 0447  NA 133* 138  K 3.9 4.0  CL 101 104  CO2 22 25  GLUCOSE 188* 222*  BUN 16 13  CREATININE 0.67 0.59  CALCIUM 8.6* 9.1   PT/INR Recent Labs    11/17/21 0447  LABPROT 13.0  INR 1.0   CMP     Component Value Date/Time   NA 138 11/17/2021 0447   NA 145 (H) 04/30/2021 1033   K 4.0 11/17/2021 0447   CL 104 11/17/2021 0447   CO2 25 11/17/2021 0447   GLUCOSE 222 (H) 11/17/2021 0447    BUN 13 11/17/2021 0447   BUN 8 04/30/2021 1033   CREATININE 0.59 11/17/2021 0447   CREATININE 0.90 10/06/2021 0921   CALCIUM 9.1 11/17/2021 0447   PROT 6.7 11/17/2021 0447   PROT 6.7 04/30/2021 1033   ALBUMIN 3.2 (L) 11/17/2021 0447   ALBUMIN 4.3 04/30/2021 1033   AST 24 11/17/2021 0447   AST 20 10/06/2021 0921   ALT 18 11/17/2021 0447   ALT 18 10/06/2021 0921   ALKPHOS 123 11/17/2021 0447   BILITOT 0.5 11/17/2021 0447   BILITOT 0.5 10/06/2021 0921   GFRNONAA >60 11/17/2021 0447   GFRNONAA >60 10/06/2021 0921   GFRAA >60 11/02/2019 1231   Lipase     Component Value Date/Time   LIPASE 111 (H) 11/16/2021 1540       Studies/Results: CT Abdomen Pelvis W Contrast  Result Date: 11/16/2021 CLINICAL DATA:  concern for fistula (passing blood/stool/purulent material from vagina EXAM: CT ABDOMEN AND PELVIS WITH CONTRAST TECHNIQUE: Multidetector CT imaging of the abdomen and pelvis was performed using the standard protocol following bolus administration of intravenous contrast. RADIATION DOSE REDUCTION: This exam was performed according to the departmental dose-optimization program which includes automated exposure control, adjustment of the mA and/or kV according to patient size and/or use of iterative reconstruction technique. CONTRAST:  140m OMNIPAQUE IOHEXOL  300 MG/ML  SOLN COMPARISON:  09/30/2021 FINDINGS: Lower chest: No acute abnormality Hepatobiliary: Enhancing lesion in the lateral segment of the left hepatic lobe measuring 1.6 cm is stable since prior study. Small subcentimeter hypodensities in the hepatic dome best seen on coronal imaging. These are new since prior study. Cannot exclude small metastases. Low-density lesion in the right hepatic lobe on coronal image 30 series 5 measures 11 mm, also new since prior study concerning for metastasis. Small low-density lesion in the inferior right hepatic lobe on image 33 of series 2. Gallbladder unremarkable. Pancreas: No focal abnormality  or ductal dilatation. Spleen: No focal abnormality.  Normal size. Adrenals/Urinary Tract: No adrenal abnormality. No focal renal abnormality. No stones or hydronephrosis. Urinary bladder is unremarkable. Stomach/Bowel: Rectal tube in place. Sigmoid diverticulosis and right colonic diverticulosis. No active diverticulitis. Stomach and small bowel decompressed. Vascular/Lymphatic: Aortic atherosclerosis. No evidence of aneurysm or adenopathy. Reproductive: Prior hysterectomy. No adnexal masses. On coronal images 64-67, there is concern for fistulous communication from the sigmoid colon to the vaginal cuff. A few locules of gas seen within the vagina. On sagittal image 77, there is possible 2 fistulae from the sigmoid colon to the vaginal cuff. No definite contrast within the vagina. Other: No free fluid or free air. 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 IMPRESSION: Several hypodensities in the liver, many of which are new since prior study. Findings concerning for worsening metastases. Concern for 2 fistulae between the sigmoid colon and vaginal cuff, best seen on coronal and sagittal imaging. Colonic diverticulosis. Electronically Signed   By: Rolm Baptise M.D.   On: 11/16/2021 22:37    Anti-infectives: Anti-infectives (From admission, onward)    Start     Dose/Rate Route Frequency Ordered Stop   11/17/21 0600  piperacillin-tazobactam (ZOSYN) IVPB 3.375 g        3.375 g 12.5 mL/hr over 240 Minutes Intravenous Every 8 hours 11/17/21 0051     11/16/21 2345  piperacillin-tazobactam (ZOSYN) IVPB 3.375 g        3.375 g 100 mL/hr over 30 Minutes Intravenous  Once 11/16/21 2339 11/17/21 0020        Assessment/Plan Metastatic breast cancer Diverticulosis - history of diverticulitis (completed course of levo/flagyl starting 8/15) Colovaginal fistula - pelvic pain improved. Mild abdominal pain. Advance to FLD  ADAT soft after  that - pending patient discussion with oncology plan to continue antibiotics 10 days (Augmentin) and pursue elective resection and anastomosis in ~2-3 months   If tolerating a soft diet stable surgically to discharge as early this afternoon. Will need to follow up with oncology if not seen during admission  FEN: FLD ADAT soft ID: [email or web address] VTE: scds - okay for chemical ppx from surgical standpoint  I reviewed last 24 h vitals and pain scores, last 48 h intake and output, last 24 h labs and trends, and last 24 h imaging results.   LOS: 1 day   [facility] 11/18/2021, 8:44 AM Please see Amion for pager number during day hours 7:00am-4:30pm

## 2021-11-18 NOTE — Progress Notes (Signed)
Mobility Specialist - Progress Note   11/18/21 1145  Mobility  HOB Elevated/Bed Position Self regulated  Activity Ambulated independently in hallway  Range of Motion/Exercises Active  Level of Assistance Independent  Assistive Device None  Distance Ambulated (ft) 500 ft  Activity Response Tolerated well  Transport method Ambulatory  $Mobility charge 1 Mobility   Pt received in bed and agreeable to mobility. No complaints during ambulation. Pt to bed after session with all needs met.   [facility]

## 2021-11-18 NOTE — Progress Notes (Signed)
                                                                                                                                                             Patient Name: Emily Wilson MRN: [id number] DOB: [birth year] Referring Physician: Dorna Mai (Profile Not Attached) Date of Service: 11/02/2021 [facility] [place]                                                        End Of Treatment Note  Diagnoses: C79.51-Secondary malignant neoplasm of bone  Cancer Staging:  Cancer Staging  Malignant neoplasm of lower-inner quadrant of right breast of female, estrogen receptor positive ([place]) Staging form: Breast, AJCC 7th Edition - Pathologic stage from 12/07/2018: Stage IV (yT4b, NX, M1) - Signed by Eppie Gibson, MD on 12/07/2018 Stage prefix: Post-therapy Laterality: Right Tumor grade (Scarff-Bloom-Richardson system): G1 Estrogen receptor status: Positive Progesterone receptor status: Negative HER2 status: Negative  Intent: Palliative  Radiation Treatment Dates: 10/27/2021 through 11/02/2021 Site Technique Total Dose (Gy) Dose per Fx (Gy) Completed Fx Beam Energies  Ilium, Left: Pelvis_L_Ilium 3D 20/20 4 5/5 10X, 15X   Narrative: The patient tolerated radiation therapy relatively well.   Plan: The patient will follow-up with radiation oncology in 1 mo and /or PRN. -----------------------------------  Eppie Gibson, MD

## 2021-11-19 ENCOUNTER — Ambulatory Visit: Payer: Self-pay

## 2021-11-19 ENCOUNTER — Encounter: Payer: Self-pay | Admitting: Gastroenterology

## 2021-11-19 ENCOUNTER — Telehealth: Payer: Self-pay

## 2021-11-19 NOTE — Patient Outreach (Signed)
  Care Coordination TOC Note Transition Care Management Unsuccessful Follow-up Telephone Call  Date of discharge and from where:  [facility] 11/17/21-10/2721  Attempts:  1st Attempt  Reason for unsuccessful TCM follow-up call:  Unable to leave message-unidentified voice mail.  Johnney Killian, RN, BSN, CCM Care Management Coordinator [facility] Phone: [phone]: [phone]

## 2021-11-19 NOTE — Progress Notes (Signed)
Patient ID: Emily Wilson, female    DOB: 1965/02/25  MRN: [id number]  CC: Hospital Discharge Follow-Up  Subjective: Emily Wilson is a 56 y.o. female who presents for hospital discharge follow-up.   Her concerns today include:  11/16/2021 - 11/18/2021 [facility] per MD note:  Recommendations for Outpatient Follow-up:  Follow up with PCP in 1 to 2 weeks General surgery to arrange outpatient follow-up. 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  CT also showed severe hypodensities in the liver concerning for worsening metastasis.  Patient was started on IV Zosyn and admitted.  General surgery consulted, and recommended oral antibiotic for 10 days (Augmentin) with a plan to pursue elective resection and anastomosis in 2 to 3 months after discussion with oncology about her chemotherapy.  Patient tolerated soft diet prior to discharge.  She is discharged on p.o. Augmentin for 10 more days.   You were hospitalized due to colovaginal fistula.  We have started you on antibiotics.  We are discharging you on more antibiotics to complete treatment course.  Follow-up with your surgeon per their recommendation to discuss surgical option.  Follow directions or recommendations by your oncologist about your chemotherapy.  We also recommend follow-up with your primary care doctor in 1 to 2 weeks.  Review your new medication list and the directions on your medications before you take them.  Problems addressed during this  hospitalization Principal Problem:   Colovaginal fistula Active Problems:   Anxiety state   Malignant neoplasm of lower-inner quadrant of right breast of female, estrogen receptor positive ([place])   DM (diabetes mellitus), type 2 ([place])   Malignant neoplasm metastatic to bone (HCC)   S/P mastectomy, right   Diverticulosis of colon   Follow-Ups  Schedule an appointment with Dorna Mai, MD (Family Medicine) in 1 week (11/25/2021) Call Danis, Kirke Corin, MD (Gastroenterology); call to schedule appointment for colonsocopy Call Leighton Ruff, MD (General Surgery); call to schedule office appointment after colonoscopy to discussed operative management of fistula  11/19/2021 per triage RN note: Chief Complaint: medication request Symptoms: NA Frequency: ongoing since 11/16/21 Pertinent Negatives: NA Disposition: _0 ED /_1 Urgent Care (no appt availability in office) / _2 Appointment(In office/virtual)/ _3  [facility]/ _4 Home Care/ _5 Refused Recommended Disposition /_6 [facility]/ _7  Follow-up with PCP Additional Notes: pt calling to see if PCP can prescribe maintenance abx for UTIs so she can prevent infection so she can have surgery as planned in 2 months. Pt is trying to stay clean and not have infections but d/t colovaginal fistula ED providers told her she could get infection. Scheduled pt ED fu appt tomorrow with Daila Elbert, NP at 1400.       Summary: Antibiotics    Patient states that she needs a low dose antibiotic to prevent UTIs until her fistula surgery. Please advise.       Reason for Disposition  [1] Caller has NON-URGENT question AND [2] triager unable to answer question  Answer Assessment - Initial Assessment Questions 1. MAIN CONCERN OR SYMPTOM:  "What is your main concern right now?" "What question do you have?" "  What's the main symptom you're worried about?" (e.g., breathing difficulty, ankle swelling, weight gain.)     Wanting to get maintenance abx for  UTIs 2. ONSET: "When did the  sx  start?"     11/16/21 4. HOSPITALIZATION: "How long were you hospitalized?" (e.g., days)     2 days 5. DISCHARGE DIAGNOSIS:  "What problem or disease were you hospitalized for?"     Diverticulitis and colovaginal fistula  6. DISCHARGE DATE: "What date were you discharged from the hospital?"     11/18/21   Today's visit 11/20/2021: Stable since hospital discharge. Taking Augmentin as prescribed at hospital discharge. Presents today for antibiotic for management of frequent urinary tract infections. She is established with Oncology and reports she is not currently on chemotherapy. She is scheduled to see Wilfrid Lund, MD on 12/15/2021 at Gastroenterology for colonoscopy. After colonoscopy she plans to schedule appointment with Leighton Ruff, MD at General Surgery to discuss fistula management. Home blood sugars are low 100's at bedtime and 160's in the morning prior to having anything to eat or drink. Also, recently had course of Prednisone which thinks is contributing to blood sugars being high. Reports hospital discharge has Humulin insulin listed on her medication list but she doesn't take it because it has been discontinued for some time. She is currently taking Humulin R and splitting the dose to 5 units at breakfast and 7 units at bedtime with the goal of preventing high morning blood sugars. She is monitoring what she eats. Sometimes does have tea with monk fruit sweetener. She is aware of appointment 12/03/2021 with Dorna Mai, MD and states she plans to have her chronic conditions assessed at that time. No further issues or concerns.    Patient Active Problem List   Diagnosis Date Noted   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   Diverticulosis of colon 09/01/2021   Slow transit constipation 09/01/2021   History of shingles 01/09/2019   S/P mastectomy, right 11/16/2018   Malignant neoplasm metastatic to bone ([place]) 05/31/2018   Family history of breast cancer in  female 03/13/2015   Malignant neoplasm of lower-inner quadrant of right breast of female, estrogen receptor positive ([place]) 02/25/2015   DM (diabetes mellitus), type 2 ([place]) 02/25/2015   HLD (hyperlipidemia) 02/25/2015   Palpitations 01/26/2010   Anxiety state 05/26/2009   Hyperthyroidism 11/14/2006   Hypertension 11/14/2006     Current Outpatient Medications on File Prior to Visit  Medication Sig Dispense Refill   Accu-Chek FastClix Lancets MISC USE TO TEST BLOOD SUGAR UP TO FOUR TIMES DAILY AS DIRECTED 102 each 5   albuterol (VENTOLIN HFA) 108 (90 Base) MCG/ACT inhaler Inhale 1-2 puffs into the lungs every 6 (six) hours as needed for wheezing or shortness of breath. 8 g 1   alpelisib 200 mg daily dose (PIQRAY, 200 MG DAILY DOSE,) tablet Take 1 tablet (200 mg total) by mouth daily. Take with food daily. 28 tablet 0   amoxicillin-clavulanate (AUGMENTIN) 875-125 MG tablet Take 1 tablet by mouth 2 (two) times daily for 10 days. 20 tablet 0   BD INSULIN SYRINGE U/F 31G X 5/16" 0.5 ML MISC USE TO ADMINISTER INSULIN 3 TIMES DAILY WITH MEALS 100 each 3   bismuth subsalicylate (PEPTO BISMOL) 262 MG/15ML suspension Take 30 mLs by mouth every 6 (six) hours as needed.     denosumab (XGEVA) 120 MG/1.7ML SOLN injection Inject 120 mg into the skin every 3 (three) months.     diclofenac (VOLTAREN) 75 MG EC tablet TAKE 1  TABLET BY MOUTH 2 TIMES DAILY AS NEEDED. (Patient taking differently: Take 75 mg by mouth 2 (two) times daily as needed for mild pain or moderate pain.) 60 tablet 2   dicyclomine (BENTYL) 20 MG tablet Take 1 tablet (20 mg total) by mouth every 6 (six) hours. 120 tablet 0   gabapentin (NEURONTIN) 100 MG capsule Take 3 capsules (300 mg total) by mouth 3 (three) times daily. 90 capsule 1   glucose blood (ACCU-CHEK GUIDE) test strip USE TO CHECK BLOOD SUGAR UP TO 4 TIMES A DAY (E11.65) 200 strip 2   HYDROmorphone (DILAUDID) 4 MG tablet Take 1 tablet (4 mg total) by mouth 4 (four) times daily as  needed for severe pain. 20 tablet 0   insulin NPH Human (HUMULIN N) 100 UNIT/ML injection INJECT 12 UNITS INTO THE SKIN AT BEDTIME 10 mL 1   insulin regular (NOVOLIN R) 100 units/mL injection INJECT 12 UNITS TOTAL INTO THE SKIN 3X DAILY BEFORE MEALS. HOLD DOSE FOR BLOOD SUGAR LESS THAN 125 30 mL 3   Ipratropium-Albuterol (COMBIVENT RESPIMAT) 20-100 MCG/ACT AERS respimat Inhale 1 puff into the lungs every 6 (six) hours. 4 g 1   letrozole (FEMARA) 2.5 MG tablet TAKE 1 TABLET BY MOUTH EVERY DAY (Patient taking differently: Take 2.5 mg by mouth daily.) 90 tablet 3   loperamide (IMODIUM) 2 MG capsule Take 2 mg by mouth as needed for diarrhea or loose stools. Take 2 tabs (4 mg) with first loose stool, then 1 tab (2 mg) with each additional loose stool. Do not take more than 8 tabs (16 mg) in a 24-hour period.     meclizine (ANTIVERT) 25 MG tablet Take 1 tablet (25 mg total) by mouth 3 (three) times daily as needed for dizziness. 30 tablet 0   metoprolol succinate (TOPROL-XL) 25 MG 24 hr tablet Take 1 tablet (25 mg total) by mouth daily. 90 tablet 3   ondansetron (ZOFRAN ODT) 8 MG disintegrating tablet Take 1 tablet (8 mg total) by mouth every 8 (eight) hours as needed for nausea or vomiting. 20 tablet 1   PEG-KCl-NaCl-NaSulf-Na Asc-C (PLENVU) 140 g SOLR Take 1 kit by mouth as directed. Use coupon: BIN: 267124 PNC: CNRX Group: [id number] ID: [id number] 1 each 0   rosuvastatin (CRESTOR) 40 MG tablet TAKE 1 TABLET BY MOUTH EVERY DAY 90 tablet 0   No current facility-administered medications on file prior to visit.    Allergies  Allergen Reactions   Doxycycline Shortness Of Breath    Wheezing, shortness of breath, rash head to toe, and swelling   Naproxen Shortness Of Breath   Oxycodone-Acetaminophen Itching   Codeine Hives   Hydrocodone-Acetaminophen Hives   Ibuprofen     Irritant to stomach r/t diverticulitis   Lantus [Insulin Glargine]     Yeast Infections   Meloxicam Other (See Comments)     Abdominal pain    Propoxyphene N-Acetaminophen Hives   Sulfa Antibiotics Hives   Sulfonamide Derivatives Hives   Tramadol Other (See Comments)    Abdominal Pain    Social History   Socioeconomic History   Marital status: Divorced    Spouse name: Not on file   Number of children: 2   Years of education: Not on file   Highest education level: Not on file  Occupational History   Occupation: Disabled  Tobacco Use   Smoking status: Former    Packs/day: 3.00    Years: 30.00    Total pack years: 90.00    Types: Cigarettes  Quit date: 10/24/2011    Years since quitting: 10.0   Smokeless tobacco: Never  Vaping Use   Vaping Use: Never used  Substance and Sexual Activity   Alcohol use: No   Drug use: Never   Sexual activity: Not Currently  Other Topics Concern   Not on file  Social History Narrative   In LTR w/boyfriend   Works Chiropractor, Scientist, water quality   Social Determinants of Health   Financial Resource Strain: Medium Risk (05/01/2021)   Overall Financial Resource Strain (CARDIA)    Difficulty of Paying Living Expenses: Somewhat hard  Food Insecurity: No Food Insecurity (05/01/2021)   Hunger Vital Sign    Worried About Running Out of Food in the Last Year: Never true    [place] in the Last Year: Never true  Transportation Needs: No Transportation Needs (05/01/2021)   PRAPARE - Hydrologist (Medical): No    Lack of Transportation (Non-Medical): No  Physical Activity: Inactive (05/01/2021)   Exercise Vital Sign    Days of Exercise per Week: 0 days    Minutes of Exercise per Session: 0 min  Stress: Stress Concern Present (05/01/2021)   [place]    Feeling of Stress : Very much  Social Connections: Socially Isolated (05/01/2021)   Social Connection and Isolation Panel [NHANES]    Frequency of Communication with Friends and Family: More than three times a week    Frequency of  Social Gatherings with Friends and Family: More than three times a week    Attends Religious Services: Never    Marine scientist or Organizations: No    Attends Archivist Meetings: Never    Marital Status: Divorced  Human resources officer Violence: Not At Risk (05/01/2021)   Humiliation, Afraid, Rape, and Kick questionnaire    Fear of Current or Ex-Partner: No    Emotionally Abused: No    Physically Abused: No    Sexually Abused: No    Family History  Problem Relation Age of Onset   Diabetes Mother    Bladder Cancer Mother 49       smoker   Other Mother 70       TAH for unspecified reason   Multiple sclerosis Mother    Kidney failure Mother    Cancer Father 76       lymphatic/tonsil cancer - in remission; former smoker   Diabetes Sister    Diabetes Sister    Heart attack Brother    COPD Maternal Grandmother        smoker   Emphysema Maternal Grandmother        smoker   Diabetes Maternal Grandmother    Stroke Maternal Grandfather    Diabetes Paternal Grandmother    Dementia Maternal Uncle    COPD Maternal Uncle        smoker   Multiple sclerosis Paternal Aunt    Breast cancer Paternal Aunt        dx. late 62s - early 51s    Past Surgical History:  Procedure Laterality Date   ABDOMINAL HYSTERECTOMY     ANTERIOR CRUCIATE LIGAMENT REPAIR Right 2007   ACL repair   APPENDECTOMY     CARPAL TUNNEL RELEASE Right    GANGLION CYST EXCISION Right    TOTAL MASTECTOMY Right 11/16/2018   Procedure: RIGHT MASTECTOMY;  Surgeon: Coralie Keens, MD;  Location: [facility];  Service: General;  Laterality: Right;  TUBAL LIGATION      ROS: Review of Systems Negative except as stated above  PHYSICAL EXAM: BP 119/86   Pulse (!) 112   Ht _0  (1.499 m)   Wt 152 lb 9.6 oz (69.2 kg)   SpO2 95%   BMI 30.82 kg/m   Physical Exam HENT:     Head: Normocephalic and atraumatic.  Eyes:     Extraocular Movements: Extraocular movements intact.     Conjunctiva/sclera:  Conjunctivae normal.     Pupils: Pupils are equal, round, and reactive to light.  Cardiovascular:     Rate and Rhythm: Tachycardia present.     Pulses: Normal pulses.     Heart sounds: Normal heart sounds.  Pulmonary:     Effort: Pulmonary effort is normal.     Breath sounds: Normal breath sounds.  Musculoskeletal:     Cervical back: Normal range of motion and neck supple.  Neurological:     General: No focal deficit present.     Mental Status: She is alert and oriented to person, place, and time.  Psychiatric:        Mood and Affect: Mood normal.        Behavior: Behavior normal.     ASSESSMENT AND PLAN: 1. Hospital discharge follow-up - Reviewed hospital course, current medications, ensured proper follow-up in place, and addressed concerns.   2. Colovaginal fistula 3. Frequent UTI - Discussed to complete course of Augmentin prescribed at hospital discharge.  - Macrobid as prescribed for recurrent urinary tract infections.  - Keep all scheduled appointments with General Surgery. - nitrofurantoin, macrocrystal-monohydrate, (MACROBID) 100 MG capsule; Take 1 capsule (100 mg total) by mouth daily.  Dispense: 30 capsule; Refill: 1  4. Malignant neoplasm of lower-inner quadrant of right breast of female, estrogen receptor positive ([place]) 5. Malignant neoplasm metastatic to bone ([place]) 6. S/P mastectomy, right - Keep all scheduled appointments with Oncology.   7. Diverticulosis of colon - Keep all scheduled appointments with Gastroenterology.   Patient was given the opportunity to ask questions.  Patient verbalized understanding of the plan and was able to repeat key elements of the plan. Patient was given clear instructions to go to Emergency Department or return to medical center if symptoms don't improve, worsen, or new problems develop.The patient verbalized understanding.  Requested Prescriptions   Signed Prescriptions Disp Refills   nitrofurantoin, macrocrystal-monohydrate,  (MACROBID) 100 MG capsule 30 capsule 1    Sig: Take 1 capsule (100 mg total) by mouth daily.    Return for Keep appointment 12/03/2021 with Dorna Mai, MD.  Camillia Herter, NP

## 2021-11-19 NOTE — Telephone Encounter (Signed)
  Chief Complaint: medication request Symptoms: NA Frequency: ongoing since 11/16/21 Pertinent Negatives: NA Disposition: '[]'$ ED /'[]'$ Urgent Care (no appt availability in office) / '[x]'$ Appointment(In office/virtual)/ '[]'$  [facility]/ '[]'$ Home Care/ '[]'$ Refused Recommended Disposition /'[]'$ [facility]/ '[]'$  Follow-up with PCP Additional Notes: pt calling to see if PCP can prescribe maintenance abx for UTIs so she can prevent infection so she can have surgery as planned in 2 months. Pt is trying to stay clean and not have infections but d/t colovaginal fistula ED providers told her she could get infection. Scheduled pt ED fu appt tomorrow with Amy, NP at 1400.   Summary: Antibiotics   Patient states that she needs a low dose antibiotic to prevent UTIs until her fistula surgery. Please advise.      Reason for Disposition  [1] Caller has NON-URGENT question AND [2] triager unable to answer question  Answer Assessment - Initial Assessment Questions 1. MAIN CONCERN OR SYMPTOM:  "What is your main concern right now?" "What question do you have?" "What's the main symptom you're worried about?" (e.g., breathing difficulty, ankle swelling, weight gain.)     Wanting to get maintenance abx for UTIs 2. ONSET: "When did the  sx  start?"     11/16/21 4. HOSPITALIZATION: "How long were you hospitalized?" (e.g., days)     2 days 5. DISCHARGE DIAGNOSIS:  "What problem or disease were you hospitalized for?"     Diverticulitis and colovaginal fistula  6. DISCHARGE DATE: "What date were you discharged from the hospital?"     11/18/21  Protocols used: Post-Hospitalization Follow-up Call-A-AH

## 2021-11-20 ENCOUNTER — Ambulatory Visit (INDEPENDENT_AMBULATORY_CARE_PROVIDER_SITE_OTHER): Payer: Medicare Other | Admitting: Family

## 2021-11-20 ENCOUNTER — Encounter: Payer: Self-pay | Admitting: Family

## 2021-11-20 VITALS — BP 119/86 | HR 112 | Ht 59.0 in | Wt 152.6 lb

## 2021-11-20 DIAGNOSIS — C50311 Malignant neoplasm of lower-inner quadrant of right female breast: Secondary | ICD-10-CM | POA: Diagnosis not present

## 2021-11-20 DIAGNOSIS — N390 Urinary tract infection, site not specified: Secondary | ICD-10-CM

## 2021-11-20 DIAGNOSIS — Z9011 Acquired absence of right breast and nipple: Secondary | ICD-10-CM

## 2021-11-20 DIAGNOSIS — Z09 Encounter for follow-up examination after completed treatment for conditions other than malignant neoplasm: Secondary | ICD-10-CM | POA: Diagnosis not present

## 2021-11-20 DIAGNOSIS — Z170 Estrogen receptor positive status [ER+]: Secondary | ICD-10-CM

## 2021-11-20 DIAGNOSIS — C7951 Secondary malignant neoplasm of bone: Secondary | ICD-10-CM

## 2021-11-20 DIAGNOSIS — N824 Other female intestinal-genital tract fistulae: Secondary | ICD-10-CM

## 2021-11-20 DIAGNOSIS — F411 Generalized anxiety disorder: Secondary | ICD-10-CM

## 2021-11-20 DIAGNOSIS — Z794 Long term (current) use of insulin: Secondary | ICD-10-CM

## 2021-11-20 DIAGNOSIS — K5730 Diverticulosis of large intestine without perforation or abscess without bleeding: Secondary | ICD-10-CM

## 2021-11-20 MED ORDER — NITROFURANTOIN MONOHYD MACRO 100 MG PO CAPS: 100.0000 mg | ORAL_CAPSULE | Freq: Every day | ORAL | 1 refills | Status: DC

## 2021-11-20 NOTE — Progress Notes (Signed)
Patient is here for hospital follow up and states to needing a maintain dose medication for UTI until she can have surgery.

## 2021-11-22 ENCOUNTER — Other Ambulatory Visit: Payer: Self-pay | Admitting: Hematology and Oncology

## 2021-11-23 ENCOUNTER — Inpatient Hospital Stay: Payer: Medicare Other | Attending: Hematology and Oncology | Admitting: Hematology and Oncology

## 2021-11-23 ENCOUNTER — Telehealth: Payer: Self-pay | Admitting: *Deleted

## 2021-11-23 DIAGNOSIS — Z170 Estrogen receptor positive status [ER+]: Secondary | ICD-10-CM

## 2021-11-23 DIAGNOSIS — C50311 Malignant neoplasm of lower-inner quadrant of right female breast: Secondary | ICD-10-CM | POA: Diagnosis not present

## 2021-11-23 NOTE — Telephone Encounter (Signed)
Received call from pt requesting hospital f/u with MD today to discuss tx plan and future surgery options.  Telephone visit scheduled and pt verbalized understanding of appt date and time.

## 2021-11-23 NOTE — Assessment & Plan Note (Signed)
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 12/19/18- 01/30/19: Radiation to chest wall  Bone scan 06/26/2019: Stable distribution of bone metastases. CT CAP3/08/2020:Mixed lytic and sclerotic bone metastases several demonstrating increased lytic character concern for worsening bone mets. 05/16/2019:[facility] molecular testing: ER positive,PIK3CA mutation present, HER-2 negative, ER positive TMB low BRCA1 not detected, ESR 1 not detected, PD-L1 negative --------------------------------------------------------------------- Treatment summary:Anastrozole with Delton See started April 2020Ibrance added 09/29/2018, Faslodex 11/20/2020 discontinued January 2023 (due to progression and toxicity)  Current treatment: Piqray with Faslodex started 11/04/2021, discontinued immediately because of rectovaginal fistula  Holding Piqray.  Continue with letrozole Surgery is likely to be done in the next month or 2.  She will need recovery afterwards.  Most likely Piqray cannot start for at least 3 to 4 months.  Return to clinic in 3 months with labs and follow-up.

## 2021-11-23 NOTE — Telephone Encounter (Signed)
Reading your previous office notes, I don't see where you have given the go -ahead to proceed with colonoscopy/ EGD, Currently on schedule for 12/15/21

## 2021-11-23 NOTE — Progress Notes (Signed)
HEMATOLOGY-ONCOLOGY TELEPHONE VISIT PROGRESS NOTE  I connected with our patient on 11/23/21 at  1:30 PM EDT by telephone and verified that I am speaking with the correct person using two identifiers.  I discussed the limitations, risks, security and privacy concerns of performing an evaluation and management service by telephone and the availability of in person appointments.  I also discussed with the patient that there may be a patient responsible charge related to this service. The patient expressed understanding and agreed to proceed.   History of Present Illness: Follow-up to discuss treatment plan.  Recent hospitalization for rectovaginal fistula.  Discontinued Piqray in anticipation of surgery.  Oncology History  Malignant neoplasm of lower-inner quadrant of right breast of female, estrogen receptor positive ([place])  02/19/2015 Mammogram   Right breast irregular mass lower inner quadrant 2.2 x 1.3 x 1.7 cm with architectural distortion and skin/nipple retraction, T2 N0 stage II a clinical stage, additional benign cysts largest 1.2 cm   02/20/2015 Initial Diagnosis   Right breast biopsy 5:00: Invasive ductal carcinoma grade 2, perineural invasion present, ER 90%, PR 70%, HER-2 negative ratio 0.95, Ki-67 15%    03/07/2015 Breast MRI   right breast inferior subareolar mass measuring 2.2 x 2.4 x 2.5 cm with skin and nipple enhancement, extending inferiorly and laterally is intraductal enhancement over a distance of 4 cm, several level I right axillary lymph nodes with cortical thickening   03/13/2015 -  Anti-estrogen oral therapy   Neoadjuvant antiestrogen therapy with tamoxifen 20 mg daily (because the patient did not want to undergo surgery immediately for multiple family reasons) stopped in late 2017; anastrozole started 05/11/18   05/23/2018 Imaging   CT CAP: 2.9 cm spiculated soft tissue density central right breast, no evidence of soft tissue metastatic disease within the chest abdomen  pelvis.  Subcentimeter sclerotic bone lesions T10, left pubis, right posterior ilium Bone scan: Foci of uptake left frontal calvarium, T-spine, right ilium   10/04/2018 - 02/2021 Anti-estrogen oral therapy    Anastrozole with Delton See started April 2020 Ibrance added 09/29/2018, Faslodex 11/20/2020 discontinued January 2023 (due to progression and toxicity)   11/16/2018 Surgery   Right mastectomy Ninfa Linden): IDC, grade 1, 5.2cm, grossly involving underlying skin, clear margins.   12/07/2018 Cancer Staging   Staging form: Breast, AJCC 7th Edition - Pathologic stage from 12/07/2018: Stage IV (yT4b, NX, M1) - Signed by Eppie Gibson, MD on 12/07/2018   12/18/2018 - 01/30/2019 Radiation Therapy   12/18/2018 through 01/30/2019 Site Technique Total Dose (Gy) Dose per Fx (Gy) Completed Fx Beam Energies  Chest Wall, Right: CW_Rt 3D 50/50 2 25/25 10X, 6X  Chest Wall, Right: CW_Rt_SCV_PAB 3D 50/50 2 25/25 6X, 10X  Chest Wall, Right: CW_Rt_Bst Electron 10/10 2 5/5 6E    05/16/2019 Miscellaneous   [facility] molecular testing: ER positive, PI K3 CA mutation present, HER-2 negative, ER positive TMB low BRCA1 not detected, ESR 1 not detected, PD-L1 negative   11/19/2020 - 12/02/2020 Radiation Therapy   11/19/2020 through 12/02/2020 Site Technique Total Dose (Gy) Dose per Fx (Gy) Completed Fx Beam Energies  Thoracic Spine: Spine_ T8-T11 3D 30/30 3 10/10 10X, 15X  Femur Left: Ext_Lt_femur Complex 30/30 3 10/10 10X  Lumbar Spine: Spine_L4-S4 3D 30/30 3 10/10 10X, 15X    04/11/2021 Treatment Plan Change   Verzinio with letrozole along with Xgeva started 04/11/2021   06/16/2021 Imaging   CT chest/abd/pelivs w/contrast  IMPRESSION: 1. No evidence of breast cancer carcinoma progression. 2. New linear interstitial thickening and bronchiectasis  in the medial RIGHT lower lobe. Query radiation treatment or aspiration. 3. Multifocal skeletal metastasis involving the pelvis, spine, ribs and shoulder girdles. No interval  change.     REVIEW OF SYSTEMS:   Constitutional: Denies fevers, chills or abnormal weight loss All other systems were reviewed with the patient and are negative. Observations/Objective:     Assessment Plan:  Malignant neoplasm of lower-inner quadrant of right breast of female, estrogen receptor positive ([place]) 02/19/2015:2.2 x 1.3 x 1.7 cm with architectural distortion and skin/nipple retraction, T2 N0 stage II a clinical stage, grade 2 IDC ER 90%, PR 20%, HER2 negative, Ki-67 15%  11/16/2018:Right mastectomy Ninfa Linden) performed because the tumor was fungating: IDC, grade 1, 5.2cm, grossly involving underlying skin, clear margins. 12/19/18- 01/30/19: Radiation to chest wall  Bone scan 06/26/2019: Stable distribution of bone metastases. CT CAP 04/28/2020: Mixed lytic and sclerotic bone metastases several demonstrating increased lytic character concern for worsening bone mets. 05/16/2019:[facility] molecular testing: ER positive, PIK3CA mutation present, HER-2 negative, ER positive TMB low BRCA1 not detected, ESR 1 not detected, PD-L1 negative --------------------------------------------------------------------- Treatment summary: Anastrozole with Xgeva started April 2020 Ibrance added 09/29/2018, Faslodex 11/20/2020 discontinued January 2023 (due to progression and toxicity)   Current treatment: Piqray with Faslodex started 11/04/2021, discontinued immediately because of rectovaginal fistula  Holding Piqray.  Continue with letrozole Surgery is likely to be done in the next month or 2.  She will need recovery afterwards.  Most likely Piqray cannot start for at least 3 to 4 months.  Return to clinic in 3 months with labs and follow-up.    I discussed the assessment and treatment plan with the patient. The patient was provided an opportunity to ask questions and all were answered. The patient agreed with the plan and demonstrated an understanding of the instructions. The patient was advised to call back  or seek an in-person evaluation if the symptoms worsen or if the condition fails to improve as anticipated.   I provided 12 minutes of non-face-to-face time during this encounter.  This includes time for charting and coordination of care   Harriette Ohara, MD

## 2021-11-24 ENCOUNTER — Encounter: Payer: Self-pay | Admitting: Gastroenterology

## 2021-11-24 ENCOUNTER — Other Ambulatory Visit (HOSPITAL_COMMUNITY): Payer: Self-pay

## 2021-11-24 ENCOUNTER — Telehealth: Payer: Self-pay | Admitting: Hematology and Oncology

## 2021-11-24 ENCOUNTER — Ambulatory Visit (INDEPENDENT_AMBULATORY_CARE_PROVIDER_SITE_OTHER): Payer: Medicare Other | Admitting: Gastroenterology

## 2021-11-24 VITALS — BP 118/76 | HR 97 | Ht 59.0 in | Wt 154.1 lb

## 2021-11-24 DIAGNOSIS — R1032 Left lower quadrant pain: Secondary | ICD-10-CM | POA: Diagnosis not present

## 2021-11-24 DIAGNOSIS — N824 Other female intestinal-genital tract fistulae: Secondary | ICD-10-CM | POA: Diagnosis not present

## 2021-11-24 DIAGNOSIS — K5732 Diverticulitis of large intestine without perforation or abscess without bleeding: Secondary | ICD-10-CM | POA: Diagnosis not present

## 2021-11-24 MED ORDER — PEG 3350-KCL-NA BICARB-NACL 420 G PO SOLR: 4000.0000 mL | Freq: Once | ORAL | 0 refills | Status: AC

## 2021-11-24 NOTE — Patient Instructions (Signed)
_______________________________________________________  If you are age 56 or older, your body mass index should be between 23-30. Your Body mass index is 31.13 kg/m. If this is out of the aforementioned range listed, please consider follow up with your Primary Care Provider.  If you are age 56 or younger, your body mass index should be between 19-25. Your Body mass index is 31.13 kg/m. If this is out of the aformentioned range listed, please consider follow up with your Primary Care Provider.   ________________________________________________________  The  GI providers would like to encourage you to use [facility] to communicate with providers for non-urgent requests or questions.  Due to long hold times on the telephone, sending your provider a message by [facility] may be a faster and more efficient way to get a response.  Please allow 48 business hours for a response.  Please remember that this is for non-urgent requests.  _______________________________________________________  Emily Wilson have been scheduled for a colonoscopy. Please follow written instructions given to you at your visit today.  Please pick up your prep supplies at the pharmacy within the next 1-3 days. If you use inhalers (even only as needed), please bring them with you on the day of your procedure.  It was a pleasure to see you today!  Thank you for trusting me with your gastrointestinal care!

## 2021-11-24 NOTE — Telephone Encounter (Signed)
Contacted patient to scheduled appointments. Left message with appointment details and a call back number if patient had any questions or could not accommodate the time we provided.   

## 2021-11-24 NOTE — Progress Notes (Signed)
[facility] Consult Note:  History: Emily Wilson 11/24/2021  Referring provider: Dorna Mai, MD  Reason for consult/chief complaint: colovaginal fistula (ER follow up), Colonoscopy (Discuss having.), and Constipation (Last BM this morning )   Subjective  HPI: [name] follows up after recent hospital stay.  I last saw her 10/09/2021 for 2 issues: Chronic dysphagia in the setting of previous radiation for stage IV breast cancer (metastatic to bone and possibly liver at that time, on immunotherapy) as well as altered bowel habits and CT scan suggesting sigmoid wall thickening and possible diverticulitis.  She had little or no pain and tenderness, had some concern she may have metastatic malignancy in the abdomen/pelvis causing this clinical picture.  She had received a course of antibiotics from her oncologist around the time of the visit with me.  She then started passing air and stool through the vagina leading to concern for colovaginal fistula.  CT scan with oncology confirmed that and she was briefly admitted and seen in surgical consultation by Dr. Marcello Moores.  Notes and data from that hospitalization were reviewed, and I received a note from the PA of the surgical service requesting office follow-up and discussion of colonoscopy prior to potential surgical resection depending on her cancer treatment plan. Was on Zosyn in hospital and then changed to Augmentin for total 10 day course.  [name] says she is feeling better on antibiotic's, has residual LLQ pain.  Somewhat constipated lately, no rectal bleeding.  She tells me that the dysphagia seems to have resolved, and she wonders if may have been esophageal spasm related to chemotherapy.   ROS:  Review of Systems Bony pain Hot flashes Remainder systems negative except as above  Past Medical History: Past Medical History:  Diagnosis Date   Anxiety state, unspecified    Arthritis    Breast cancer of lower-inner  quadrant of right female breast ([place])    Carpal tunnel syndrome    Complication of anesthesia    woke up during ganglion cyst removal in her 20's, not woken up since   Diabetes mellitus without complication (HCC)    Type II   Diverticulosis    Heart murmur    History of radiation therapy 12/18/18- 01/30/19   Right Chest wall 25 fractions X 2Gy each to total 50 Gy, followed by a boost 10 Gy in 5 fractions.    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, without mention of thyrotoxic crisis or storm      Past Surgical History: Past Surgical History:  Procedure Laterality Date   ABDOMINAL HYSTERECTOMY     ANTERIOR CRUCIATE LIGAMENT REPAIR Right 2007   ACL repair   APPENDECTOMY     CARPAL TUNNEL RELEASE Right    GANGLION CYST EXCISION Right    TOTAL MASTECTOMY Right 11/16/2018   Procedure: RIGHT MASTECTOMY;  Surgeon: Coralie Keens, MD;  Location: [facility];  Service: General;  Laterality: Right;   TUBAL LIGATION       Family History: Family History  Problem Relation Age of Onset   Diabetes Mother    Bladder Cancer Mother 78       smoker   Other Mother 63       TAH for unspecified reason   Multiple sclerosis Mother    Kidney failure Mother    Cancer Father 52  lymphatic/tonsil cancer - in remission; former smoker   Diabetes Sister    Diabetes Sister    Heart attack Brother    COPD Maternal Grandmother        smoker   Emphysema Maternal Grandmother        smoker   Diabetes Maternal Grandmother    Stroke Maternal Grandfather    Diabetes Paternal Grandmother    Dementia Maternal Uncle    COPD Maternal Uncle        smoker   Multiple sclerosis Paternal Aunt    Breast cancer Paternal Aunt        dx. late 43s - early 11s    Social History: Social History   Socioeconomic History   Marital status: Divorced    Spouse name: Not on file   Number of  children: 2   Years of education: Not on file   Highest education level: Not on file  Occupational History   Occupation: Disabled  Tobacco Use   Smoking status: Former    Packs/day: 3.00    Years: 30.00    Total pack years: 90.00    Types: Cigarettes    Quit date: 10/24/2011    Years since quitting: 10.0   Smokeless tobacco: Never  Vaping Use   Vaping Use: Never used  Substance and Sexual Activity   Alcohol use: No   Drug use: Never   Sexual activity: Not Currently  Other Topics Concern   Not on file  Social History Narrative   In [place] w/boyfriend   Works Chiropractor, Scientist, water quality   Social Determinants of Health   Financial Resource Strain: Medium Risk (05/01/2021)   Overall Financial Resource Strain (CARDIA)    Difficulty of Paying Living Expenses: Somewhat hard  Food Insecurity: No Food Insecurity (05/01/2021)   Hunger Vital Sign    Worried About Running Out of Food in the Last Year: Never true    [place] in the Last Year: Never true  Transportation Needs: No Transportation Needs (05/01/2021)   PRAPARE - Hydrologist (Medical): No    Lack of Transportation (Non-Medical): No  Physical Activity: Inactive (05/01/2021)   Exercise Vital Sign    Days of Exercise per Week: 0 days    Minutes of Exercise per Session: 0 min  Stress: Stress Concern Present (05/01/2021)   [place]    Feeling of Stress : Very much  Social Connections: Socially Isolated (05/01/2021)   Social Connection and Isolation Panel [NHANES]    Frequency of Communication with Friends and Family: More than three times a week    Frequency of Social Gatherings with Friends and Family: More than three times a week    Attends Religious Services: Never    Marine scientist or Organizations: No    Attends Archivist Meetings: Never    Marital Status: Divorced    Allergies: Allergies  Allergen Reactions    Doxycycline Shortness Of Breath    Wheezing, shortness of breath, rash head to toe, and swelling   Naproxen Shortness Of Breath   Oxycodone-Acetaminophen Itching   Codeine Hives   Hydrocodone-Acetaminophen Hives   Ibuprofen     Irritant to stomach r/t diverticulitis   Lantus [Insulin Glargine]     Yeast Infections   Meloxicam Other (See Comments)    Abdominal pain    Propoxyphene N-Acetaminophen Hives   Sulfa Antibiotics Hives   Sulfonamide Derivatives Hives   Tramadol  Other (See Comments)    Abdominal Pain    Outpatient Meds: Current Outpatient Medications  Medication Sig Dispense Refill   Accu-Chek FastClix Lancets MISC USE TO TEST BLOOD SUGAR UP TO FOUR TIMES DAILY AS DIRECTED 102 each 5   albuterol (VENTOLIN HFA) 108 (90 Base) MCG/ACT inhaler Inhale 1-2 puffs into the lungs every 6 (six) hours as needed for wheezing or shortness of breath. 8 g 1   amoxicillin-clavulanate (AUGMENTIN) 875-125 MG tablet Take 1 tablet by mouth 2 (two) times daily for 10 days. 20 tablet 0   BD INSULIN SYRINGE U/F 31G X 5/16" 0.5 ML MISC USE TO ADMINISTER INSULIN 3 TIMES DAILY WITH MEALS 100 each 3   denosumab (XGEVA) 120 MG/1.7ML SOLN injection Inject 120 mg into the skin every 3 (three) months.     diclofenac (VOLTAREN) 75 MG EC tablet Take 1 tablet (75 mg total) by mouth 2 (two) times daily as needed for mild pain or moderate pain. 60 tablet 3   gabapentin (NEURONTIN) 100 MG capsule Take 3 capsules (300 mg total) by mouth 3 (three) times daily. 90 capsule 1   glucose blood (ACCU-CHEK GUIDE) test strip USE TO CHECK BLOOD SUGAR UP TO 4 TIMES A DAY (E11.65) 200 strip 2   HYDROmorphone (DILAUDID) 4 MG tablet Take 1 tablet (4 mg total) by mouth 4 (four) times daily as needed for severe pain. 20 tablet 0   insulin NPH Human (HUMULIN N) 100 UNIT/ML injection INJECT 12 UNITS INTO THE SKIN AT BEDTIME 10 mL 1   insulin regular (NOVOLIN R) 100 units/mL injection INJECT 12 UNITS TOTAL INTO THE SKIN 3X DAILY  BEFORE MEALS. HOLD DOSE FOR BLOOD SUGAR LESS THAN 125 30 mL 3   Ipratropium-Albuterol (COMBIVENT RESPIMAT) 20-100 MCG/ACT AERS respimat Inhale 1 puff into the lungs every 6 (six) hours. 4 g 1   letrozole (FEMARA) 2.5 MG tablet TAKE 1 TABLET BY MOUTH EVERY DAY (Patient taking differently: Take 2.5 mg by mouth daily.) 90 tablet 3   metoprolol succinate (TOPROL-XL) 25 MG 24 hr tablet Take 1 tablet (25 mg total) by mouth daily. 90 tablet 3   nitrofurantoin, macrocrystal-monohydrate, (MACROBID) 100 MG capsule Take 1 capsule (100 mg total) by mouth daily. 30 capsule 1   ondansetron (ZOFRAN ODT) 8 MG disintegrating tablet Take 1 tablet (8 mg total) by mouth every 8 (eight) hours as needed for nausea or vomiting. 20 tablet 1   PEG-KCl-NaCl-NaSulf-Na Asc-C (PLENVU) 140 g SOLR Take 1 kit by mouth as directed. Use coupon: BIN: 888280 PNC: CNRX Group: [id number] ID: [id number] 1 each 0   rosuvastatin (CRESTOR) 40 MG tablet TAKE 1 TABLET BY MOUTH EVERY DAY 90 tablet 0   alpelisib 200 mg daily dose (PIQRAY, 200 MG DAILY DOSE,) tablet Take 1 tablet (200 mg total) by mouth daily. Take with food daily. (Patient not taking: Reported on 11/24/2021) 28 tablet 0   bismuth subsalicylate (PEPTO BISMOL) 262 MG/15ML suspension Take 30 mLs by mouth every 6 (six) hours as needed. (Patient not taking: Reported on 11/24/2021)     dicyclomine (BENTYL) 20 MG tablet Take 1 tablet (20 mg total) by mouth every 6 (six) hours. (Patient not taking: Reported on 11/24/2021) 120 tablet 0   loperamide (IMODIUM) 2 MG capsule Take 2 mg by mouth as needed for diarrhea or loose stools. Take 2 tabs (4 mg) with first loose stool, then 1 tab (2 mg) with each additional loose stool. Do not take more than 8 tabs (16 mg) in a  24-hour period. (Patient not taking: Reported on 11/24/2021)     meclizine (ANTIVERT) 25 MG tablet Take 1 tablet (25 mg total) by mouth 3 (three) times daily as needed for dizziness. (Patient not taking: Reported on 11/24/2021) 30  tablet 0   No current facility-administered medications for this visit.      ___________________________________________________________________ Objective   Exam:  BP 118/76   Pulse 97   Ht '4\' 11"'  (1.499 m)   Wt 154 lb 2 oz (69.9 kg)   BMI 31.13 kg/m  Wt Readings from Last 3 Encounters:  11/24/21 154 lb 2 oz (69.9 kg)  11/20/21 152 lb 9.6 oz (69.2 kg)  11/16/21 156 lb (70.8 kg)    General: Well-appearing Eyes: sclera anicteric, no redness ENT: oral mucosa moist without lesions, no cervical or supraclavicular lymphadenopathy CV: Regular without murmur, no JVD, no peripheral edema Resp: clear to auscultation bilaterally, normal RR and effort noted GI: soft, minimal LLQ tenderness, with active bowel sounds. No guarding or palpable organomegaly noted. Skin; warm and dry, no rash or jaundice noted Neuro: awake, alert and oriented x 3. Normal gross motor function and fluent speech  Labs:     Latest Ref Rng & Units 11/17/2021    4:47 AM 11/16/2021    3:40 PM 10/06/2021    9:21 AM  CBC  WBC 4.0 - 10.5 K/uL 6.5  7.3  4.0   Hemoglobin 12.0 - 15.0 g/dL 13.8  14.1  12.9   Hematocrit 36.0 - 46.0 % 42.1  41.7  37.6   Platelets 150 - 400 K/uL 311  365  319      Radiologic Studies:  CLINICAL DATA:  concern for fistula (passing blood/stool/purulent material from vagina   EXAM: CT ABDOMEN AND PELVIS WITH CONTRAST   TECHNIQUE: Multidetector CT imaging of the abdomen and pelvis was performed using the standard protocol following bolus administration of intravenous contrast.   RADIATION DOSE REDUCTION: This exam was performed according to the departmental dose-optimization program which includes automated exposure control, adjustment of the mA and/or kV according to patient size and/or use of iterative reconstruction technique.   CONTRAST:  142m OMNIPAQUE IOHEXOL 300 MG/ML  SOLN   COMPARISON:  09/30/2021   FINDINGS: Lower chest: No acute abnormality   Hepatobiliary:  Enhancing lesion in the lateral segment of the left hepatic lobe measuring 1.6 cm is stable since prior study. Small subcentimeter hypodensities in the hepatic dome best seen on coronal imaging. These are new since prior study. Cannot exclude small metastases. Low-density lesion in the right hepatic lobe on coronal image 30 series 5 measures 11 mm, also new since prior study concerning for metastasis. Small low-density lesion in the inferior right hepatic lobe on image 33 of series 2. Gallbladder unremarkable.   Pancreas: No focal abnormality or ductal dilatation.   Spleen: No focal abnormality.  Normal size.   Adrenals/Urinary Tract: No adrenal abnormality. No focal renal abnormality. No stones or hydronephrosis. Urinary bladder is unremarkable.   Stomach/Bowel: Rectal tube in place. Sigmoid diverticulosis and right colonic diverticulosis. No active diverticulitis. Stomach and small bowel decompressed.   Vascular/Lymphatic: Aortic atherosclerosis. No evidence of aneurysm or adenopathy.   Reproductive: Prior hysterectomy. No adnexal masses. On coronal images 64-67, there is concern for fistulous communication from the sigmoid colon to the vaginal cuff. A few locules of gas seen within the vagina. On sagittal image 77, there is possible 2 fistulae from the sigmoid colon to the vaginal cuff. No definite contrast within the vagina.  Other: No free fluid or free air.   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   IMPRESSION: Several hypodensities in the liver, many of which are new since prior study. Findings concerning for worsening metastases.   Concern for 2 fistulae between the sigmoid colon and vaginal cuff, best seen on coronal and sagittal imaging.   Colonic diverticulosis.     Electronically Signed   By: Rolm Baptise M.D.   On: 11/16/2021 22:37   Assessment: Encounter Diagnoses  Name  Primary?   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 She is feeling better on antibiotics, and will finish them in a few days.  She no longer reports dysphagia and does not feel she needs an upper endoscopy as we had previously discussed.  However, she needs a colonoscopy to rule out neoplasia prior to considerations of surgical resection. She was scheduled for a few weeks from now, and we will try to move it up if there is a morning slot available (insulin requiring diabetic).  The benefits and risks of the planned procedure were described in detail with the patient or (when appropriate) their health care proxy.  Risks were outlined as including, but not limited to, bleeding, infection, perforation, adverse medication reaction leading to cardiac or pulmonary decompensation, pancreatitis (if ERCP).  The limitation of incomplete mucosal visualization was also discussed.  No guarantees or warranties were given.  Patient at increased risk for cardiopulmonary complications of procedure due to medical comorbidities.  Thank you for the courtesy of this consult.  Please call me with any questions or concerns.  Nelida Meuse III  CC: Referring provider noted above

## 2021-11-24 NOTE — Telephone Encounter (Signed)
She is coming to a clinic appointment today.   - HD

## 2021-11-25 NOTE — Chronic Care Management (AMB) (Signed)
  Care Coordination Note  11/25/2021 Name: Emily Wilson MRN: [id number] DOB: [birth year]  Emily Wilson is a 56 y.o. year old female who is a primary care patient of Dorna Mai, MD and is actively engaged with the care management team. I reached out to Emily Wilson by phone today to assist with re-scheduling an initial visit with the RN Case Manager  Follow up plan: Unsuccessful telephone outreach attempt made. A HIPAA compliant phone message was left for the patient providing contact information and requesting a return call.  The care management team will reach out to the patient again over the next 7 days.  If patient returns call to provider office, please advise to call [place]  at [place] [employer]  [place], [place] [address] Direct Dial: [phone] [email or web address]

## 2021-11-26 ENCOUNTER — Ambulatory Visit: Payer: Medicare Other | Admitting: Pharmacist

## 2021-11-26 ENCOUNTER — Other Ambulatory Visit (HOSPITAL_COMMUNITY): Payer: Self-pay

## 2021-11-26 ENCOUNTER — Other Ambulatory Visit: Payer: Medicare Other

## 2021-11-26 DIAGNOSIS — R739 Hyperglycemia, unspecified: Secondary | ICD-10-CM

## 2021-11-27 ENCOUNTER — Encounter: Payer: Self-pay | Admitting: Hematology and Oncology

## 2021-12-01 NOTE — Progress Notes (Incomplete)
Ms. Arno presents today for follow-up after completing radiation to her left hip/pelvic area on 11/02/2021  Pain: Patient denies pain. Reports cramping to right rib cage area. Patient states she thinks the discomfort is  caused byscar tissue from previous radiation.  Mobility: Good.  Bowel/Bladder: Dealing with colovaginal fistula. Saw GI (Dr. Wilfrid Lund on 11/24/2021) and is scheduled for colonoscopy on 12/08/2021 MedOnc F/U: Had telephone F/U with Dr. 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  Continue with letrozole Surgery is likely to be done in the next month or 2.  She will need recovery afterwards.   Most likely Piqray cannot start for at least 3 to 4 months. --Return to clinic in 3 months with labs and follow-up.  Other issues of note:   BP 121/74 (BP Location: Left Arm, Patient Position: Sitting, Cuff Size: Normal)   Pulse (!) 106   Temp 97.6 F (36.4 C)   Resp 20   Ht '4\' 11"'$  (1.499 m)   Wt 154 lb 6.4 oz (70 kg)   SpO2 98%   BMI 31.19 kg/m

## 2021-12-02 ENCOUNTER — Other Ambulatory Visit (HOSPITAL_COMMUNITY): Payer: Self-pay

## 2021-12-02 ENCOUNTER — Ambulatory Visit
Admission: RE | Admit: 2021-12-02 | Discharge: 2021-12-02 | Disposition: A | Discharge status: Home / Self Care | Payer: Medicare Other | Source: Ambulatory Visit | Attending: Radiation Oncology | Admitting: Radiation Oncology

## 2021-12-02 ENCOUNTER — Other Ambulatory Visit: Payer: Self-pay

## 2021-12-02 ENCOUNTER — Encounter: Payer: Self-pay | Admitting: Radiation Oncology

## 2021-12-02 VITALS — BP 121/74 | HR 106 | Temp 97.6°F | Resp 20 | Ht 59.0 in | Wt 154.4 lb

## 2021-12-02 DIAGNOSIS — Z79899 Other long term (current) drug therapy: Secondary | ICD-10-CM | POA: Diagnosis not present

## 2021-12-02 DIAGNOSIS — C50311 Malignant neoplasm of lower-inner quadrant of right female breast: Secondary | ICD-10-CM | POA: Diagnosis not present

## 2021-12-02 DIAGNOSIS — K5730 Diverticulosis of large intestine without perforation or abscess without bleeding: Secondary | ICD-10-CM | POA: Diagnosis not present

## 2021-12-02 DIAGNOSIS — Z79811 Long term (current) use of aromatase inhibitors: Secondary | ICD-10-CM | POA: Diagnosis not present

## 2021-12-02 DIAGNOSIS — Z170 Estrogen receptor positive status [ER+]: Secondary | ICD-10-CM | POA: Diagnosis not present

## 2021-12-02 DIAGNOSIS — N823 Fistula of vagina to large intestine: Secondary | ICD-10-CM | POA: Diagnosis not present

## 2021-12-02 DIAGNOSIS — K769 Liver disease, unspecified: Secondary | ICD-10-CM | POA: Diagnosis not present

## 2021-12-02 DIAGNOSIS — C7951 Secondary malignant neoplasm of bone: Secondary | ICD-10-CM | POA: Insufficient documentation

## 2021-12-02 DIAGNOSIS — Z923 Personal history of irradiation: Secondary | ICD-10-CM | POA: Diagnosis not present

## 2021-12-02 NOTE — Progress Notes (Addendum)
Radiation Oncology         [phone]) [phone] ________________________________  Name: Emily Wilson MRN: [id number]  Date: 12/02/2021  DOB: 1965-07-16  Follow-Up Visit Note  Outpatient  CC: Emily Mai, MD  Emily Lose, MD  Diagnosis and Prior Radiotherapy:    ICD-10-CM   1. Malignant neoplasm metastatic to bone [facility]) [C79.51]  C79.51     STAGE IV  Most recent radiation Treatment Dates: 10/27/2021 through 11/02/2021 Site Technique Total Dose (Gy) Dose per Fx (Gy) Completed Fx Beam Energies  Ilium, Left: Pelvis_L_Ilium 3D 20/20 4 5/5 10X, 15X    CHIEF COMPLAINT: Here for follow-up and surveillance of bone cancer  Narrative:  The patient returns today for routine follow-up.  Emily Wilson presents today for follow-up after completing radiation to her left hip/pelvic area on 11/02/2021  Pain: Patient denies pain. Reports cramping to right rib cage area. Patient states she thinks the discomfort is  caused byscar tissue from previous radiation (right breast region).  Mobility: Good.  Bowel/Bladder: Dealing with colovaginal fistula diagnosed by CT scan after ER admission.  Saw GI (Dr. Wilfrid Wilson on 11/24/2021) and is scheduled for colonoscopy on 12/08/2021 for further work-up, hoping for surgical resolution thereafter MedOnc F/U: Had telephone F/U with Dr. 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  Continue with letrozole Surgery is likely to be done in the next month or 2.  She will need recovery afterwards.   Most likely Piqray cannot start for at least 3 to 4 months. --Return to clinic in 3 months with labs and follow-up.  Other issues of note:   BP 121/74 (BP Location: Left Arm, Patient Position:  Sitting, Cuff Size: Normal)   Pulse (!) 106   Temp 97.6 F (36.4 C)   Resp 20   Ht '4\' 11"'  (1.499 m)   Wt 154 lb 6.4 oz (70 kg)   SpO2 98%   BMI 31.19 kg/m                                ALLERGIES:  is allergic to doxycycline, naproxen, oxycodone-acetaminophen, codeine, hydrocodone-acetaminophen, ibuprofen, lantus [insulin glargine], meloxicam, propoxyphene n-acetaminophen, sulfa antibiotics, sulfonamide derivatives, and tramadol.  Meds: Current Outpatient Medications  Medication Sig Dispense Refill   Accu-Chek FastClix Lancets MISC USE TO TEST BLOOD SUGAR UP TO FOUR TIMES DAILY AS DIRECTED 102 each 5   albuterol (VENTOLIN HFA) 108 (90 Base) MCG/ACT inhaler Inhale 1-2 puffs into the lungs every 6 (six) hours as needed for wheezing or shortness of breath. 8 g 1   BD INSULIN SYRINGE U/F 31G X 5/16" 0.5 ML MISC USE TO ADMINISTER INSULIN 3 TIMES DAILY WITH MEALS 100 each 3   denosumab (XGEVA) 120 MG/1.7ML SOLN injection Inject 120 mg into the skin every 3 (three) months.     diclofenac (VOLTAREN) 75 MG EC tablet Take 1 tablet (75 mg total) by mouth 2 (two) times daily as needed for mild pain or moderate pain. 60 tablet 3   glucose blood (ACCU-CHEK GUIDE) test strip USE TO CHECK BLOOD SUGAR UP TO 4 TIMES A DAY (E11.65) 200 strip 2   insulin NPH Human (HUMULIN N) 100 UNIT/ML injection INJECT 12 UNITS INTO THE SKIN AT BEDTIME 10 mL 1   insulin  regular (NOVOLIN R) 100 units/mL injection INJECT 12 UNITS TOTAL INTO THE SKIN 3X DAILY BEFORE MEALS. HOLD DOSE FOR BLOOD SUGAR LESS THAN 125 30 mL 3   Ipratropium-Albuterol (COMBIVENT RESPIMAT) 20-100 MCG/ACT AERS respimat Inhale 1 puff into the lungs every 6 (six) hours. 4 g 1   letrozole (FEMARA) 2.5 MG tablet TAKE 1 TABLET BY MOUTH EVERY DAY (Patient taking differently: Take 2.5 mg by mouth daily.) 90 tablet 3   metoprolol succinate (TOPROL-XL) 25 MG 24 hr tablet Take 1 tablet (25 mg total) by mouth daily. 90 tablet 3   nitrofurantoin,  macrocrystal-monohydrate, (MACROBID) 100 MG capsule Take 1 capsule (100 mg total) by mouth daily. 30 capsule 1   alpelisib 200 mg daily dose (PIQRAY, 200 MG DAILY DOSE,) tablet Take 1 tablet (200 mg total) by mouth daily. Take with food daily. (Patient not taking: Reported on 11/24/2021) 28 tablet 0   bismuth subsalicylate (PEPTO BISMOL) 262 MG/15ML suspension Take 30 mLs by mouth every 6 (six) hours as needed. (Patient not taking: Reported on 11/24/2021)     dicyclomine (BENTYL) 20 MG tablet Take 1 tablet (20 mg total) by mouth every 6 (six) hours. (Patient not taking: Reported on 11/24/2021) 120 tablet 0   gabapentin (NEURONTIN) 100 MG capsule Take 3 capsules (300 mg total) by mouth 3 (three) times daily. (Patient not taking: Reported on 12/02/2021) 90 capsule 1   HYDROmorphone (DILAUDID) 4 MG tablet Take 1 tablet (4 mg total) by mouth 4 (four) times daily as needed for severe pain. (Patient not taking: Reported on 12/02/2021) 20 tablet 0   loperamide (IMODIUM) 2 MG capsule Take 2 mg by mouth as needed for diarrhea or loose stools. Take 2 tabs (4 mg) with first loose stool, then 1 tab (2 mg) with each additional loose stool. Do not take more than 8 tabs (16 mg) in a 24-hour period. (Patient not taking: Reported on 11/24/2021)     meclizine (ANTIVERT) 25 MG tablet Take 1 tablet (25 mg total) by mouth 3 (three) times daily as needed for dizziness. (Patient not taking: Reported on 11/24/2021) 30 tablet 0   ondansetron (ZOFRAN ODT) 8 MG disintegrating tablet Take 1 tablet (8 mg total) by mouth every 8 (eight) hours as needed for nausea or vomiting. (Patient not taking: Reported on 12/02/2021) 20 tablet 1   PEG-KCl-NaCl-NaSulf-Na Asc-C (PLENVU) 140 g SOLR Take 1 kit by mouth as directed. Use coupon: BIN: [id number] PNC: CNRX Group: [id number] ID: [id number] (Patient not taking: Reported on 12/02/2021) 1 each 0   rosuvastatin (CRESTOR) 40 MG tablet TAKE 1 TABLET BY MOUTH EVERY DAY (Patient not taking: Reported on  12/02/2021) 90 tablet 0   No current facility-administered medications for this encounter.    Physical Findings: The patient is in no acute distress. Patient is alert and oriented.  height is '4\' 11"'  (1.499 m) and weight is 154 lb 6.4 oz (70 kg). Her temperature is 97.6 F (36.4 C). Her blood pressure is 121/74 and her pulse is 106 (abnormal). Her respiration is 20 and oxygen saturation is 98%. .    Telangiectasias at inframammary fold of right breast region.  Ambulatory.  Well-appearing. ECOG= 1  Lab Findings: Lab Results  Component Value Date   WBC 6.5 11/17/2021   HGB 13.8 11/17/2021   HCT 42.1 11/17/2021   MCV 95.5 11/17/2021   PLT 311 11/17/2021    Radiographic Findings: CT Abdomen Pelvis W Contrast  Result Date: 11/16/2021 CLINICAL DATA:  concern for fistula (passing blood/stool/purulent material  from vagina EXAM: CT ABDOMEN AND PELVIS WITH CONTRAST TECHNIQUE: Multidetector CT imaging of the abdomen and pelvis was performed using the standard protocol following bolus administration of intravenous contrast. RADIATION DOSE REDUCTION: This exam was performed according to the departmental dose-optimization program which includes automated exposure control, adjustment of the mA and/or kV according to patient size and/or use of iterative reconstruction technique. CONTRAST:  119m OMNIPAQUE IOHEXOL 300 MG/ML  SOLN COMPARISON:  09/30/2021 FINDINGS: Lower chest: No acute abnormality Hepatobiliary: Enhancing lesion in the lateral segment of the left hepatic lobe measuring 1.6 cm is stable since prior study. Small subcentimeter hypodensities in the hepatic dome best seen on coronal imaging. These are new since prior study. Cannot exclude small metastases. Low-density lesion in the right hepatic lobe on coronal image 30 series 5 measures 11 mm, also new since prior study concerning for metastasis. Small low-density lesion in the inferior right hepatic lobe on image 33 of series 2. Gallbladder  unremarkable. Pancreas: No focal abnormality or ductal dilatation. Spleen: No focal abnormality.  Normal size. Adrenals/Urinary Tract: No adrenal abnormality. No focal renal abnormality. No stones or hydronephrosis. Urinary bladder is unremarkable. Stomach/Bowel: Rectal tube in place. Sigmoid diverticulosis and right colonic diverticulosis. No active diverticulitis. Stomach and small bowel decompressed. Vascular/Lymphatic: Aortic atherosclerosis. No evidence of aneurysm or adenopathy. Reproductive: Prior hysterectomy. No adnexal masses. On coronal images 64-67, there is concern for fistulous communication from the sigmoid colon to the vaginal cuff. A few locules of gas seen within the vagina. On sagittal image 77, there is possible 2 fistulae from the sigmoid colon to the vaginal cuff. No definite contrast within the vagina. Other: No free fluid or free air. 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 IMPRESSION: Several hypodensities in the liver, many of which are new since prior study. Findings concerning for worsening metastases. Concern for 2 fistulae between the sigmoid colon and vaginal cuff, best seen on coronal and sagittal imaging. Colonic diverticulosis. Electronically Signed   By: KRolm BaptiseM.D.   On: 11/16/2021 22:37    Impression/Plan: She is recovering well from radiation to left iliac bone.  She is now dealing with further work-up and treatment for colovaginal fistula.  She will continue systemic therapy at the discretion of Dr. GLindi Wilson  I will see her back as needed -she knows not to hesitate to call me PRN.  On date of service, in total, I spent 15 minutes on this encounter. Patient was seen in person.  _____________________________________   SEppie Gibson MD

## 2021-12-03 ENCOUNTER — Encounter: Payer: Medicare Other | Admitting: Gastroenterology

## 2021-12-03 ENCOUNTER — Encounter: Payer: Self-pay | Admitting: Family Medicine

## 2021-12-03 ENCOUNTER — Ambulatory Visit (INDEPENDENT_AMBULATORY_CARE_PROVIDER_SITE_OTHER): Payer: Medicare Other | Admitting: Family Medicine

## 2021-12-03 VITALS — BP 122/83 | HR 89 | Temp 98.1°F | Resp 16 | Wt 155.8 lb

## 2021-12-03 DIAGNOSIS — I10 Essential (primary) hypertension: Secondary | ICD-10-CM

## 2021-12-03 DIAGNOSIS — E1159 Type 2 diabetes mellitus with other circulatory complications: Secondary | ICD-10-CM | POA: Diagnosis not present

## 2021-12-03 DIAGNOSIS — Z794 Long term (current) use of insulin: Secondary | ICD-10-CM

## 2021-12-03 LAB — POCT GLYCOSYLATED HEMOGLOBIN (HGB A1C): Hemoglobin A1C: 7.6 % — AB (ref 4.0–5.6)

## 2021-12-03 NOTE — Progress Notes (Signed)
Established Patient Office Visit  Subjective    Patient ID: VALLEY KE, female    DOB: 03/14/1965  Age: 56 y.o. MRN: [id number]  CC:  Chief Complaint  Patient presents with   Follow-up    HPI Emily Wilson presents for follow up of diabetes and hypertension. She was recommended to have more frequent follow ups 2/2 cancer management possibly increasing her sugars and bp. However, she states that they have stopped that particular treatment for her.    Outpatient Encounter Medications as of 12/03/2021  Medication Sig   Accu-Chek FastClix Lancets MISC USE TO TEST BLOOD SUGAR UP TO FOUR TIMES DAILY AS DIRECTED   albuterol (VENTOLIN HFA) 108 (90 Base) MCG/ACT inhaler Inhale 1-2 puffs into the lungs every 6 (six) hours as needed for wheezing or shortness of breath.   BD INSULIN SYRINGE U/F 31G X 5/16" 0.5 ML MISC USE TO ADMINISTER INSULIN 3 TIMES DAILY WITH MEALS   denosumab (XGEVA) 120 MG/1.7ML SOLN injection Inject 120 mg into the skin every 3 (three) months.   diclofenac (VOLTAREN) 75 MG EC tablet Take 1 tablet (75 mg total) by mouth 2 (two) times daily as needed for mild pain or moderate pain.   glucose blood (ACCU-CHEK GUIDE) test strip USE TO CHECK BLOOD SUGAR UP TO 4 TIMES A DAY (E11.65)   alpelisib 200 mg daily dose (PIQRAY, 200 MG DAILY DOSE,) tablet Take 1 tablet (200 mg total) by mouth daily. Take with food daily. (Patient not taking: Reported on 11/24/2021)   bismuth subsalicylate (PEPTO BISMOL) 262 MG/15ML suspension Take 30 mLs by mouth every 6 (six) hours as needed. (Patient not taking: Reported on 11/24/2021)   dicyclomine (BENTYL) 20 MG tablet Take 1 tablet (20 mg total) by mouth every 6 (six) hours. (Patient not taking: Reported on 11/24/2021)   gabapentin (NEURONTIN) 100 MG capsule Take 3 capsules (300 mg total) by mouth 3 (three) times daily. (Patient not taking: Reported on 12/02/2021)   HYDROmorphone (DILAUDID) 4 MG tablet Take 1 tablet (4 mg total) by mouth 4  (four) times daily as needed for severe pain. (Patient not taking: Reported on 12/02/2021)   insulin NPH Human (HUMULIN N) 100 UNIT/ML injection INJECT 12 UNITS INTO THE SKIN AT BEDTIME   insulin regular (NOVOLIN R) 100 units/mL injection INJECT 12 UNITS TOTAL INTO THE SKIN 3X DAILY BEFORE MEALS. HOLD DOSE FOR BLOOD SUGAR LESS THAN 125   Ipratropium-Albuterol (COMBIVENT RESPIMAT) 20-100 MCG/ACT AERS respimat Inhale 1 puff into the lungs every 6 (six) hours.   letrozole (FEMARA) 2.5 MG tablet TAKE 1 TABLET BY MOUTH EVERY DAY (Patient taking differently: Take 2.5 mg by mouth daily.)   loperamide (IMODIUM) 2 MG capsule Take 2 mg by mouth as needed for diarrhea or loose stools. Take 2 tabs (4 mg) with first loose stool, then 1 tab (2 mg) with each additional loose stool. Do not take more than 8 tabs (16 mg) in a 24-hour period. (Patient not taking: Reported on 11/24/2021)   meclizine (ANTIVERT) 25 MG tablet Take 1 tablet (25 mg total) by mouth 3 (three) times daily as needed for dizziness. (Patient not taking: Reported on 11/24/2021)   metoprolol succinate (TOPROL-XL) 25 MG 24 hr tablet Take 1 tablet (25 mg total) by mouth daily.   nitrofurantoin, macrocrystal-monohydrate, (MACROBID) 100 MG capsule Take 1 capsule (100 mg total) by mouth daily.   ondansetron (ZOFRAN ODT) 8 MG disintegrating tablet Take 1 tablet (8 mg total) by mouth every 8 (eight) hours as  needed for nausea or vomiting. (Patient not taking: Reported on 12/02/2021)   PEG-KCl-NaCl-NaSulf-Na Asc-C (PLENVU) 140 g SOLR Take 1 kit by mouth as directed. Use coupon: BIN: [id number] PNC: CNRX Group: [id number] ID: [id number] (Patient not taking: Reported on 12/02/2021)   polyethylene glycol-electrolytes (NULYTELY) 420 g solution Take 4,000 mLs by mouth once.   rosuvastatin (CRESTOR) 40 MG tablet TAKE 1 TABLET BY MOUTH EVERY DAY (Patient not taking: Reported on 12/02/2021)   No facility-administered encounter medications on file as of 12/03/2021.     Past Medical History:  Diagnosis Date   Anxiety state, unspecified    Arthritis    Breast cancer of lower-inner quadrant of right female breast [facility])    Carpal tunnel syndrome    Complication of anesthesia    woke up during ganglion cyst removal in her 20's, not woken up since   Diabetes mellitus without complication (HCC)    Type II   Diverticulosis    Heart murmur    History of radiation therapy 12/18/18- 01/30/19   Right Chest wall 25 fractions X 2Gy each to total 50 Gy, followed by a boost 10 Gy in 5 fractions.    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, without mention of thyrotoxic crisis or storm     Past Surgical History:  Procedure Laterality Date   ABDOMINAL HYSTERECTOMY     ANTERIOR CRUCIATE LIGAMENT REPAIR Right 2007   ACL repair   APPENDECTOMY     CARPAL TUNNEL RELEASE Right    GANGLION CYST EXCISION Right    TOTAL MASTECTOMY Right 11/16/2018   Procedure: RIGHT MASTECTOMY;  Surgeon: Coralie Keens, MD;  Location: [facility];  Service: General;  Laterality: Right;   TUBAL LIGATION      Family History  Problem Relation Age of Onset   Diabetes Mother    Bladder Cancer Mother 31       smoker   Other Mother 76       TAH for unspecified reason   Multiple sclerosis Mother    Kidney failure Mother    Cancer Father 61       lymphatic/tonsil cancer - in remission; former smoker   Diabetes Sister    Diabetes Sister    Heart attack Brother    COPD Maternal Grandmother        smoker   Emphysema Maternal Grandmother        smoker   Diabetes Maternal Grandmother    Stroke Maternal Grandfather    Diabetes Paternal Grandmother    Dementia Maternal Uncle    COPD Maternal Uncle        smoker   Multiple sclerosis Paternal Aunt    Breast cancer Paternal Aunt        dx. late 22s - early 35s    Social History   Socioeconomic History   Marital  status: Divorced    Spouse name: Not on file   Number of children: 2   Years of education: Not on file   Highest education level: Not on file  Occupational History   Occupation: Disabled  Tobacco Use   Smoking status: Former    Packs/day: 3.00    Years: 30.00    Total pack years: 90.00    Types: Cigarettes    Quit date: 10/24/2011    Years since quitting: 10.1   Smokeless  tobacco: Never  Vaping Use   Vaping Use: Never used  Substance and Sexual Activity   Alcohol use: No   Drug use: Never   Sexual activity: Not Currently  Other Topics Concern   Not on file  Social History Narrative   In LTR w/boyfriend   Works Chiropractor, Scientist, water quality   Social Determinants of Health   Financial Resource Strain: Medium Risk (05/01/2021)   Overall Financial Resource Strain (CARDIA)    Difficulty of Paying Living Expenses: Somewhat hard  Food Insecurity: No Food Insecurity (05/01/2021)   Hunger Vital Sign    Worried About Running Out of Food in the Last Year: Never true    [place] in the Last Year: Never true  Transportation Needs: No Transportation Needs (05/01/2021)   PRAPARE - Hydrologist (Medical): No    Lack of Transportation (Non-Medical): No  Physical Activity: Inactive (05/01/2021)   Exercise Vital Sign    Days of Exercise per Week: 0 days    Minutes of Exercise per Session: 0 min  Stress: Stress Concern Present (05/01/2021)   [place]    Feeling of Stress : Very much  Social Connections: Socially Isolated (05/01/2021)   Social Connection and Isolation Panel [NHANES]    Frequency of Communication with Friends and Family: More than three times a week    Frequency of Social Gatherings with Friends and Family: More than three times a week    Attends Religious Services: Never    Marine scientist or Organizations: No    Attends Archivist Meetings: Never    Marital Status:  Divorced  Human resources officer Violence: Not At Risk (05/01/2021)   Humiliation, Afraid, Rape, and Kick questionnaire    Fear of Current or Ex-Partner: No    Emotionally Abused: No    Physically Abused: No    Sexually Abused: No    Review of Systems  All other systems reviewed and are negative.       Objective    BP 122/83   Pulse 89   Temp 98.1 F (36.7 C) (Oral)   Resp 16   Wt 155 lb 12.8 oz (70.7 kg)   SpO2 94%   BMI 31.47 kg/m   Physical Exam Vitals and nursing note reviewed.  Constitutional:      General: She is not in acute distress. Cardiovascular:     Rate and Rhythm: Normal rate and regular rhythm.  Pulmonary:     Effort: Pulmonary effort is normal.     Breath sounds: Normal breath sounds.  Abdominal:     Palpations: Abdomen is soft.     Tenderness: There is no abdominal tenderness.  Neurological:     General: No focal deficit present.     Mental Status: She is alert and oriented to person, place, and time.         Assessment & Plan:   1. Type 2 diabetes mellitus with other circulatory complication, with long-term current use of insulin (HCC) A1c is stable and slightly above goal. Continue present management - POCT glycosylated hemoglobin (Hb A1C)  2. Essential hypertension Appears stable. continue    No follow-ups on file.   Becky Sax, MD

## 2021-12-07 ENCOUNTER — Encounter: Payer: Self-pay | Admitting: Family Medicine

## 2021-12-08 ENCOUNTER — Encounter: Payer: Self-pay | Admitting: Gastroenterology

## 2021-12-08 ENCOUNTER — Ambulatory Visit (AMBULATORY_SURGERY_CENTER): Payer: Medicare Other | Admitting: Gastroenterology

## 2021-12-08 VITALS — BP 127/63 | HR 86 | Temp 98.2°F | Resp 17 | Ht 59.0 in | Wt 154.0 lb

## 2021-12-08 DIAGNOSIS — R1030 Lower abdominal pain, unspecified: Secondary | ICD-10-CM

## 2021-12-08 DIAGNOSIS — N824 Other female intestinal-genital tract fistulae: Secondary | ICD-10-CM | POA: Diagnosis not present

## 2021-12-08 DIAGNOSIS — K5732 Diverticulitis of large intestine without perforation or abscess without bleeding: Secondary | ICD-10-CM

## 2021-12-08 MED ORDER — SODIUM CHLORIDE 0.9 % IV SOLN: 500.0000 mL | Freq: Once | INTRAVENOUS | Status: DC

## 2021-12-08 NOTE — Op Note (Signed)
[facility] Patient Name: Emily Wilson Procedure Date: 12/08/2021 9:42 AM MRN: [id number] Endoscopist: Mallie Mussel L. 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                           CR surgery has seen and planning resection                           Colonoscopy requested to r/o primary or metastatic                            malignancy within colon Medicines:                Monitored Anesthesia Care Procedure:                Pre-Anesthesia Assessment:                           - Prior to the procedure, a History and Physical                            was performed, and patient medications and                            allergies were reviewed. The patient's tolerance of                            previous anesthesia was also reviewed. The risks                            and benefits of the procedure and the sedation                            options and risks were discussed with the patient.                            All questions were answered, and informed consent                            was obtained. Prior Anticoagulants: The patient has                            taken no previous anticoagulant or antiplatelet  agents. ASA Grade Assessment: III - A patient with                            severe systemic disease. After reviewing the risks                            and benefits, the patient was deemed in                            satisfactory condition to undergo the procedure.                           After obtaining informed consent, the colonoscope                            was passed under direct vision.  Throughout the                            procedure, the patient's blood pressure, pulse, and                            oxygen saturations were monitored continuously. The                            PCF-HQ190L Colonoscope was introduced through the                            anus and advanced to the the terminal ileum, with                            identification of the appendiceal orifice and IC                            valve. The colonoscopy was performed without                            difficulty. The patient tolerated the procedure                            well. The quality of the bowel preparation was                            excellent. The terminal ileum, ileocecal valve,                            appendiceal orifice, and rectum were photographed. Scope In: 9:49:39 AM Scope Out: 9:59:37 AM Scope Withdrawal Time: 0 hours 6 minutes 34 seconds  Total Procedure Duration: 0 hours 9 minutes 58 seconds  Findings:                 The perianal and digital rectal examinations were                            normal.  The terminal ileum appeared normal.                           Many diverticula were found in the entire colon                            (cecum to proximal rectum). No diverticular                            purulent drainage or erythema, no mass or stricture                            seen.                           The exam was otherwise without abnormality on                            direct and retroflexion views. Complications:            No immediate complications. Estimated Blood Loss:     Estimated blood loss: none. Impression:               - The examined portion of the ileum was normal.                           - Diverticulosis in the entire examined colon.                           - The examination was otherwise normal on direct                            and retroflexion views.                           - No specimens  collected. Recommendation:           - Patient has a contact number available for                            emergencies. The signs and symptoms of potential                            delayed complications were discussed with the                            patient. Return to normal activities tomorrow.                            Written discharge instructions were provided to the                            patient.                           - Resume previous diet.                           -  Continue present medications.                           - No recommendation at this time regarding repeat                            colonoscopy.                           - Follow up with surgeon (Dr. Marcello Moores) to discuss                            partial colon resection. (this report will be                            forwarded) Mallie Mussel L. Loletha Carrow, MD 12/08/2021 10:11:01 AM This report has been signed electronically.

## 2021-12-08 NOTE — Progress Notes (Signed)
No changes to clinical history since GI office visit on 11/24/21.  The patient is appropriate for an endoscopic procedure in the ambulatory setting.  - Wilfrid Lund, MD

## 2021-12-08 NOTE — Progress Notes (Signed)
To pacu, VSS. Report to [email or web address] 

## 2021-12-08 NOTE — Chronic Care Management (AMB) (Signed)
  Care Coordination Note  12/08/2021 Name: SHAKEENA KAFER MRN: [id number] DOB: 10-Dec-1965  [name] is a 56 y.o. year old female who is a primary care patient of Dorna Mai, MD and is actively engaged with the care management team. I reached out to [name] by phone today to assist with re-scheduling an initial visit with the RN Case Manager and Pharmacist  Follow up plan: Telephone appointment with care management team member scheduled for:01/21/2022  Noreene Larsson, [place], [place] [address] Direct Dial: [phone] [email or web address]

## 2021-12-08 NOTE — Patient Instructions (Addendum)
Handout on diverticulosis given to patient.  Resume previous diet and continue present medications. No recommendations at this time regarding repeat colonoscopy.  Follow-up with surgeon (Dr. Marcello Moores) to discuss partial colon resection (the report will be forwarded)   YOU HAD AN ENDOSCOPIC PROCEDURE TODAY AT [facility]:   Refer to the procedure report that was given to you for any specific questions about what was found during the examination.  If the procedure report does not answer your questions, please call your gastroenterologist to clarify.  If you requested that your care partner not be given the details of your procedure findings, then the procedure report has been included in a sealed envelope for you to review at your convenience later.  YOU SHOULD EXPECT: Some feelings of bloating in the abdomen. Passage of more gas than usual.  Walking can help get rid of the air that was put into your GI tract during the procedure and reduce the bloating. If you had a lower endoscopy (such as a colonoscopy or flexible sigmoidoscopy) you may notice spotting of blood in your stool or on the toilet paper. If you underwent a bowel prep for your procedure, you may not have a normal bowel movement for a few days.  Please Note:  You might notice some irritation and congestion in your nose or some drainage.  This is from the oxygen used during your procedure.  There is no need for concern and it should clear up in a day or so.  SYMPTOMS TO REPORT IMMEDIATELY:  Following lower endoscopy (colonoscopy or flexible sigmoidoscopy):  Excessive amounts of blood in the stool  Significant tenderness or worsening of abdominal pains  Swelling of the abdomen that is new, acute  Fever of 100F or higher  For urgent or emergent issues, a gastroenterologist can be reached at any hour by calling [phone]. Do not use MyChart messaging for urgent concerns.    DIET:  We do recommend a small meal at  first, but then you may proceed to your regular diet.  Drink plenty of fluids but you should avoid alcoholic beverages for 24 hours.  ACTIVITY:  You should plan to take it easy for the rest of today and you should NOT DRIVE or use heavy machinery until tomorrow (because of the sedation medicines used during the test).    FOLLOW UP: Our staff will call the number listed on your records the next business day following your procedure.  We will call around 7:15- 8:00 am to check on you and address any questions or concerns that you may have regarding the information given to you following your procedure. If we do not reach you, we will leave a message.     If any biopsies were taken you will be contacted by phone or by letter within the next 1-3 weeks.  Please call us at [phone] if you have not heard about the biopsies in 3 weeks.    SIGNATURES/CONFIDENTIALITY: You and/or your care partner have signed paperwork which will be entered into your electronic medical record.  These signatures attest to the fact that that the information above on your After Visit Summary has been reviewed and is understood.  Full responsibility of the confidentiality of this discharge information lies with you and/or your care-partner.

## 2021-12-09 ENCOUNTER — Telehealth: Payer: Self-pay

## 2021-12-09 NOTE — Telephone Encounter (Signed)
  Follow up Call-     12/08/2021    9:22 AM  Call back number  Post procedure Call Back phone  # [phone]  Permission to leave phone message Yes     Patient questions:  Do you have a fever, pain , or abdominal swelling? No. Pain Score  0 *  Have you tolerated food without any problems? Yes.    Have you been able to return to your normal activities? Yes.    Do you have any questions about your discharge instructions: Diet   No. Medications  No. Follow up visit  No.  Do you have questions or concerns about your Care? No.  Actions: * If pain score is 4 or above: No action needed, pain <4.

## 2021-12-15 ENCOUNTER — Encounter: Payer: Medicare Other | Admitting: Gastroenterology

## 2021-12-28 ENCOUNTER — Ambulatory Visit
Admission: EM | Admit: 2021-12-28 | Discharge: 2021-12-28 | Disposition: A | Discharge status: Home / Self Care | Payer: Medicare Other | Attending: Physician Assistant | Admitting: Physician Assistant

## 2021-12-28 ENCOUNTER — Ambulatory Visit: Payer: Self-pay

## 2021-12-28 ENCOUNTER — Other Ambulatory Visit: Payer: Self-pay | Admitting: Hematology and Oncology

## 2021-12-28 ENCOUNTER — Ambulatory Visit (INDEPENDENT_AMBULATORY_CARE_PROVIDER_SITE_OTHER): Payer: Medicare Other

## 2021-12-28 ENCOUNTER — Other Ambulatory Visit: Payer: Self-pay | Admitting: Family Medicine

## 2021-12-28 DIAGNOSIS — J441 Chronic obstructive pulmonary disease with (acute) exacerbation: Secondary | ICD-10-CM

## 2021-12-28 DIAGNOSIS — R0602 Shortness of breath: Secondary | ICD-10-CM | POA: Diagnosis not present

## 2021-12-28 DIAGNOSIS — R059 Cough, unspecified: Secondary | ICD-10-CM | POA: Diagnosis not present

## 2021-12-28 DIAGNOSIS — E785 Hyperlipidemia, unspecified: Secondary | ICD-10-CM

## 2021-12-28 MED ORDER — PREDNISONE 20 MG PO TABS: 40.0000 mg | ORAL_TABLET | Freq: Every day | ORAL | 0 refills | Status: AC

## 2021-12-28 MED ORDER — AZITHROMYCIN 250 MG PO TABS: 250.0000 mg | ORAL_TABLET | Freq: Every day | ORAL | 0 refills | Status: DC

## 2021-12-28 NOTE — ED Notes (Signed)
Pt presents with ongoing non productive cough X 2 months that has been unrelieved with prescribed medication; pt states it hurts her rib area when she coughs and when she lays down she feels short of breath and goes into a coughing fit.

## 2021-12-28 NOTE — Telephone Encounter (Signed)
  Chief Complaint: SOB Symptoms: SOB, cough, pain when breathing in chest and in between shoulder blades Frequency: ongoing since 10/2021 Pertinent Negatives: NA Disposition: '[]'$ ED /'[x]'$ Urgent Care (no appt availability in office) / '[]'$ Appointment(In office/virtual)/ '[]'$  [facility]/ '[]'$ Home Care/ '[]'$ Refused Recommended Disposition /'[]'$ [facility]/ '[]'$  Follow-up with PCP Additional Notes: pt went to UC on 11/10/21 was given abx and says still having SOB and cough, she is still on maintenance abx and using rescue inhaler 2-3 times a day. O2 sats 91-93% and then improves after inhaler use. Pt asking for CXR to see what is going on with lungs. Advised no appts until 01/04/22. Recommended UC and unable to schedule appt for today but pt is going in to be seen.   Reason for Disposition . [1] Longstanding difficulty breathing (e.g., CHF, COPD, emphysema) AND [2] WORSE than normal  Answer Assessment - Initial Assessment Questions 1. RESPIRATORY STATUS: "Describe your breathing?" (e.g., wheezing, shortness of breath, unable to speak, severe coughing)      SOB, coughing  2. ONSET: "When did this breathing problem begin?"      Ongoing since 11/10/21 3. PATTERN "Does the difficult breathing come and go, or has it been constant since it started?"      Constant  4. SEVERITY: "How bad is your breathing?" (e.g., mild, moderate, severe)    - MILD: No SOB at rest, mild SOB with walking, speaks normally in sentences, can lie down, no retractions, pulse < 100.    - MODERATE: SOB at rest, SOB with minimal exertion and prefers to sit, cannot lie down flat, speaks in phrases, mild retractions, audible wheezing, pulse 100-120.    - SEVERE: Very SOB at rest, speaks in single words, struggling to breathe, sitting hunched forward, retractions, pulse > 120      Unable to lie down flat,  9. OTHER SYMPTOMS: "Do you have any other symptoms? (e.g., dizziness, runny nose, cough, chest pain, fever)     Hurts to  breathe, pain in between shoulder blades  10. O2 SATURATION MONITOR:  "Do you use an oxygen saturation monitor (pulse oximeter) at home?" If Yes, ask: "What is your reading (oxygen level) today?" "What is your usual oxygen saturation reading?" (e.g., 95%)       91-93  Protocols used: Breathing Difficulty-A-AH

## 2021-12-28 NOTE — ED Provider Notes (Signed)
[name]    [name]: [id number] Arrival date & time: 12/28/21  1239      History   Chief Complaint Chief Complaint  Patient presents with   Cough    cough and SOB ongoing since 11/10/21. pt wanting to have CXR done. hurts to take deep breath and in between shoulder - Entered by patient    HPI Emily Wilson is a 56 y.o. female.   For evaluation of cough and shortness of breath she has had for almost 2 months.  She reports that she has some pain when she tries to take a deep breath.  This pain is noted between her shoulder blades.  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  She also has COPD.  He denies fever.  The history is provided by the patient.  Cough Associated symptoms: shortness of breath   Associated symptoms: no chills, no ear pain, no eye discharge, no fever, no sore throat and no wheezing     Past Medical History:  Diagnosis Date   Anxiety state, unspecified    Arthritis    Breast cancer of lower-inner quadrant of right female breast [facility])    Carpal tunnel syndrome    Complication of anesthesia    woke up during ganglion cyst removal in her 20's, not woken up since   Diabetes mellitus without complication (HCC)    Type II   Diverticulosis    Heart murmur    History of radiation therapy 12/18/18- 01/30/19   Right Chest wall 25 fractions X 2Gy each to total 50 Gy, followed by a boost 10 Gy in 5 fractions.    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   Diverticulosis of colon 09/01/2021   Slow transit constipation 09/01/2021   History of shingles 01/09/2019   S/P mastectomy, right 11/16/2018   Malignant neoplasm metastatic to bone ([place]) 05/31/2018    Family history of breast cancer in female 03/13/2015   Malignant neoplasm of lower-inner quadrant of right breast of female, estrogen receptor positive ([place]) 02/25/2015   DM (diabetes mellitus), type 2 ([place]) 02/25/2015   HLD (hyperlipidemia) 02/25/2015   Palpitations 01/26/2010   Anxiety state 05/26/2009   Hyperthyroidism 11/14/2006   Hypertension 11/14/2006    Past Surgical History:  Procedure Laterality Date   ABDOMINAL HYSTERECTOMY     ANTERIOR CRUCIATE LIGAMENT REPAIR Right 2007   ACL repair   APPENDECTOMY     CARPAL TUNNEL RELEASE Right    GANGLION CYST EXCISION Right    TOTAL MASTECTOMY Right 11/16/2018   Procedure: RIGHT MASTECTOMY;  Surgeon: Coralie Keens, MD;  Location: [place];  Service: General;  Laterality: Right;   TUBAL LIGATION      OB History   No obstetric history on file.      Home Medications    Prior to Admission medications   Medication Sig Start Date End Date Taking? Authorizing Provider  azithromycin (ZITHROMAX) 250 MG tablet Take 1 tablet (250 mg total) by mouth daily. Take first 2 tablets together, then 1 every day until finished. 12/28/21  Yes Francene Finders, PA-C  predniSONE (DELTASONE) 20 MG tablet Take 2 tablets (40  mg total) by mouth daily with breakfast for 5 days. 12/28/21 01/02/22 Yes Francene Finders, PA-C  Accu-Chek FastClix Lancets MISC USE TO TEST BLOOD SUGAR UP TO FOUR TIMES DAILY AS DIRECTED 11/02/21   Dorna Mai, MD  albuterol (VENTOLIN HFA) 108 (90 Base) MCG/ACT inhaler Inhale 1-2 puffs into the lungs every 6 (six) hours as needed for wheezing or shortness of breath. 11/10/21   Nyoka Lint, PA-C  alpelisib 200 mg daily dose (PIQRAY, 200 MG DAILY DOSE,) tablet Take 1 tablet (200 mg total) by mouth daily. Take with food daily. Patient not taking: Reported on 11/24/2021 11/04/21   [name], MD  BD INSULIN SYRINGE U/F 31G X 5/16" 0.5 ML MISC USE TO ADMINISTER INSULIN 3 TIMES DAILY WITH MEALS 09/07/21   Dorna Mai, MD  bismuth  subsalicylate (PEPTO BISMOL) 262 MG/15ML suspension Take 30 mLs by mouth every 6 (six) hours as needed. Patient not taking: Reported on 11/24/2021    [provider]  denosumab (XGEVA) 120 MG/1.7ML SOLN injection Inject 120 mg into the skin every 3 (three) months. Patient not taking: Reported on 12/08/2021    [provider]  diclofenac (VOLTAREN) 75 MG EC tablet Take 1 tablet (75 mg total) by mouth 2 (two) times daily as needed for mild pain or moderate pain. Patient not taking: Reported on 12/08/2021 11/23/21   [name], MD  dicyclomine (BENTYL) 20 MG tablet Take 1 tablet (20 mg total) by mouth every 6 (six) hours. Patient not taking: Reported on 11/24/2021 05/20/21   Dorna Mai, MD  gabapentin (NEURONTIN) 100 MG capsule Take 3 capsules (300 mg total) by mouth 3 (three) times daily. Patient not taking: Reported on 12/02/2021 02/06/19   [name], MD  glucose blood (ACCU-CHEK GUIDE) test strip USE TO CHECK BLOOD SUGAR UP TO 4 TIMES A DAY (E11.65) 07/31/21   Dorna Mai, MD  HYDROmorphone (DILAUDID) 4 MG tablet Take 1 tablet (4 mg total) by mouth 4 (four) times daily as needed for severe pain. Patient not taking: Reported on 12/02/2021 10/28/21   Eppie Gibson, MD  insulin NPH Human (HUMULIN N) 100 UNIT/ML injection INJECT 12 UNITS INTO THE SKIN AT BEDTIME 07/23/20   [name], MD  insulin regular (NOVOLIN R) 100 units/mL injection INJECT 12 UNITS TOTAL INTO THE SKIN 3X DAILY BEFORE MEALS. HOLD DOSE FOR BLOOD SUGAR LESS THAN 125 11/02/21   Dorna Mai, MD  Ipratropium-Albuterol (COMBIVENT RESPIMAT) 20-100 MCG/ACT AERS respimat Inhale 1 puff into the lungs every 6 (six) hours. 11/10/21   Nyoka Lint, PA-C  letrozole [facility]) 2.5 MG tablet TAKE 1 TABLET BY MOUTH EVERY DAY 12/28/21   [name], MD  loperamide (IMODIUM) 2 MG capsule Take 2 mg by mouth as needed for diarrhea or loose stools. Take 2 tabs (4 mg) with first loose stool, then 1 tab (2 mg) with  each additional loose stool. Do not take more than 8 tabs (16 mg) in a 24-hour period. Patient not taking: Reported on 11/24/2021    [name], MD  meclizine (ANTIVERT) 25 MG tablet Take 1 tablet (25 mg total) by mouth 3 (three) times daily as needed for dizziness. Patient not taking: Reported on 11/24/2021 04/30/21   Francene Finders, PA-C  metoprolol succinate (TOPROL-XL) 25 MG 24 hr tablet Take 1 tablet (25 mg total) by mouth daily. 04/14/21   [name], MD  nitrofurantoin, macrocrystal-monohydrate, (MACROBID) 100 MG capsule Take 1 capsule (100 mg total) by mouth daily. 11/20/21   Minette Brine, Amy  J, NP  ondansetron (ZOFRAN ODT) 8 MG disintegrating tablet Take 1 tablet (8 mg total) by mouth every 8 (eight) hours as needed for nausea or vomiting. Patient not taking: Reported on 12/02/2021 04/20/21   [name], MD  rosuvastatin (CRESTOR) 40 MG tablet TAKE 1 TABLET BY MOUTH EVERY DAY Patient not taking: Reported on 12/02/2021 10/09/21   Dorna Mai, MD    Family History Family History  Problem Relation Age of Onset   Diabetes Mother    Bladder Cancer Mother 10       smoker   Other Mother 87       TAH for unspecified reason   Multiple sclerosis Mother    Kidney failure Mother    Cancer Father 61       lymphatic/tonsil cancer - in remission; former smoker   Diabetes Sister    Diabetes Sister    Heart attack Brother    COPD Maternal Grandmother        smoker   Emphysema Maternal Grandmother        smoker   Diabetes Maternal Grandmother    Stroke Maternal Grandfather    Diabetes Paternal Grandmother    Dementia Maternal Uncle    COPD Maternal Uncle        smoker   Multiple sclerosis Paternal Aunt    Breast cancer Paternal Aunt        dx. late 4s - early 51s    Social History Social History   Tobacco Use   Smoking status: Former    Packs/day: 3.00    Years: 30.00    Total pack years: 90.00    Types: Cigarettes    Quit date: 10/24/2011    Years since quitting: 10.1    Smokeless tobacco: Never  Vaping Use   Vaping Use: Never used  Substance Use Topics   Alcohol use: No   Drug use: Never     Allergies   Doxycycline, Naproxen, Oxycodone-acetaminophen, Codeine, Hydrocodone-acetaminophen, Ibuprofen, Lantus [insulin glargine], Meloxicam, Propoxyphene n-acetaminophen, Sulfa antibiotics, Sulfonamide derivatives, and Tramadol   Review of Systems Review of Systems  Constitutional:  Negative for chills and fever.  HENT:  Negative for congestion, ear pain and sore throat.   Eyes:  Negative for discharge and redness.  Respiratory:  Positive for cough and shortness of breath. Negative for wheezing.   Gastrointestinal:  Negative for abdominal pain, diarrhea, nausea and vomiting.     Physical Exam Triage Vital Signs ED Triage Vitals  Enc Vitals Group     BP 12/28/21 1323 (!) 142/86     Pulse Rate 12/28/21 1323 (!) 109     Resp 12/28/21 1323 17     Temp 12/28/21 1323 98.1 F (36.7 C)     Temp Source 12/28/21 1323 Oral     SpO2 12/28/21 1323 95 %     Weight --      Height --      Head Circumference --      Peak Flow --      Pain Score 12/28/21 1324 2     Pain Loc --      Pain Edu? --      Excl. in [place]? --    No data found.  Updated Vital Signs BP (!) 142/86 (BP Location: Left Arm)   Pulse (!) 109   Temp 98.1 F (36.7 C) (Oral)   Resp 17   SpO2 95%      Physical Exam Vitals and nursing note reviewed.  Constitutional:  General: She is not in acute distress.    Appearance: Normal appearance. She is not ill-appearing.  HENT:     Head: Normocephalic and atraumatic.     Nose: No congestion or rhinorrhea.     Mouth/Throat:     Mouth: Mucous membranes are moist.     Pharynx: No oropharyngeal exudate or posterior oropharyngeal erythema.  Eyes:     Conjunctiva/sclera: Conjunctivae normal.  Cardiovascular:     Rate and Rhythm: Normal rate and regular rhythm.     Heart sounds: Normal heart sounds. No murmur heard. Pulmonary:      Effort: Pulmonary effort is normal. No respiratory distress.     Breath sounds: Normal breath sounds. No wheezing, rhonchi or rales.  Skin:    General: Skin is warm and dry.  Neurological:     Mental Status: She is alert.  Psychiatric:        Mood and Affect: Mood normal.        Thought Content: Thought content normal.      UC Treatments / Results  Labs (all labs ordered are listed, but only abnormal results are displayed) Labs Reviewed - No data to display  EKG   Radiology DG Chest 2 View  Result Date: 12/28/2021 CLINICAL DATA:  Ongoing nonproductive cough and shortness of breath since September; history of breast cancer, diabetes mellitus, former smoker EXAM: CHEST - 2 VIEW COMPARISON:  07/17/2021 FINDINGS: Normal heart size, mediastinal contours, and pulmonary vascularity. New line Atherosclerotic calcification aorta. Chronic accentuation of interstitial markings, predominantly peripheral, unchanged. No acute infiltrate, pleural effusion, or pneumothorax. Multiple lytic lesions are identified consistent with either lytic osseous metastatic disease or multiple myeloma, likely metastatic disease in light of patient's history of breast cancer. IMPRESSION: Chronic interstitial changes without definite acute infiltrate. 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 Electronically Signed   By: Lavonia Dana M.D.   On: 12/28/2021 13:47    Procedures Procedures (including critical care time)  Medications Ordered in UC Medications - No data to display  Initial Impression / Assessment and Plan / UC Course  I have reviewed the triage vital signs and the nursing notes.  Pertinent labs & imaging results that were available during my care of the patient were reviewed by me and considered in my medical decision making (see chart for details).   Steroid burst and Z-Pak prescribed to cover COPD exacerbation.  No  apparent pneumonia noted on x-ray.  Recommended follow-up in the emergency room for CT should symptoms not improve.    Final Clinical Impressions(s) / UC Diagnoses   Final diagnoses:  COPD exacerbation [facility])   Discharge Instructions   None    ED Prescriptions     Medication Sig Dispense Auth. Provider   predniSONE (DELTASONE) 20 MG tablet Take 2 tablets (40 mg total) by mouth daily with breakfast for 5 days. 10 tablet Ewell Poe F, PA-C   azithromycin (ZITHROMAX) 250 MG tablet Take 1 tablet (250 mg total) by mouth daily. Take first 2 tablets together, then 1 every day until finished. 6 tablet Francene Finders, PA-C      PDMP not reviewed this encounter.   Francene Finders, PA-C 12/28/21 1511

## 2021-12-28 NOTE — Telephone Encounter (Signed)
Requested Prescriptions  Pending Prescriptions Disp Refills   rosuvastatin (CRESTOR) 40 MG tablet [Pharmacy Med Name: ROSUVASTATIN CALCIUM 40 MG TAB] 90 tablet 0    Sig: TAKE 1 TABLET BY MOUTH EVERY DAY     Cardiovascular:  Antilipid - Statins 2 Failed - 12/28/2021  5:47 AM      Failed - Lipid Panel in normal range within the last 12 months    Cholesterol, Total  Date Value Ref Range Status  11/11/2020 307 (H) 100 - 199 mg/dL Final   LDL Chol Calc (NIH)  Date Value Ref Range Status  11/11/2020 200 (H) 0 - 99 mg/dL Final   Direct LDL  Date Value Ref Range Status  11/04/2015 210.0 mg/dL Final    Comment:    Optimal:  <100 mg/dLNear or Above Optimal:  100-129 mg/dLBorderline High:  130-159 mg/dLHigh:  160-189 mg/dLVery High:  >190 mg/dL   HDL  Date Value Ref Range Status  11/11/2020 49 >39 mg/dL Final   Triglycerides  Date Value Ref Range Status  11/11/2020 291 (H) 0 - 149 mg/dL Final         Passed - Cr in normal range and within 360 days    Creatinine  Date Value Ref Range Status  10/06/2021 0.90 0.44 - 1.00 mg/dL Final   Creatinine, Ser  Date Value Ref Range Status  11/17/2021 0.59 0.44 - 1.00 mg/dL Final         Passed - Patient is not pregnant      Passed - Valid encounter within last 12 months    Recent Outpatient Visits           3 weeks ago Type 2 diabetes mellitus with other circulatory complication, with long-term current use of insulin ([place])   Primary Care at [facility], MD   1 month ago Hospital discharge follow-up   Primary Care at [facility] [place], NP   3 months ago Dysuria   Primary Care at [place], MD   3 months ago Type 2 diabetes mellitus with other circulatory complication, with long-term current use of insulin [facility])   Primary Care at [facility], MD   7 months ago Dysphagia, unspecified type   Primary Care at [facility], MD       Future Appointments              In 2 months Dorna Mai, MD Primary Care at [facility]

## 2021-12-29 ENCOUNTER — Other Ambulatory Visit: Payer: Medicare Other

## 2021-12-29 ENCOUNTER — Ambulatory Visit: Payer: Medicare Other | Admitting: Hematology and Oncology

## 2021-12-29 ENCOUNTER — Ambulatory Visit: Payer: Medicare Other

## 2022-01-01 ENCOUNTER — Encounter: Payer: Self-pay | Admitting: Nurse Practitioner

## 2022-01-01 ENCOUNTER — Ambulatory Visit: Payer: Medicare Other | Attending: Nurse Practitioner | Admitting: Nurse Practitioner

## 2022-01-01 VITALS — BP 138/86 | HR 95 | Ht 59.0 in | Wt 150.6 lb

## 2022-01-01 DIAGNOSIS — I10 Essential (primary) hypertension: Secondary | ICD-10-CM | POA: Diagnosis present

## 2022-01-01 DIAGNOSIS — R0609 Other forms of dyspnea: Secondary | ICD-10-CM

## 2022-01-01 DIAGNOSIS — R002 Palpitations: Secondary | ICD-10-CM | POA: Diagnosis present

## 2022-01-01 DIAGNOSIS — E782 Mixed hyperlipidemia: Secondary | ICD-10-CM | POA: Diagnosis present

## 2022-01-01 DIAGNOSIS — J449 Chronic obstructive pulmonary disease, unspecified: Secondary | ICD-10-CM | POA: Diagnosis not present

## 2022-01-01 DIAGNOSIS — R0789 Other chest pain: Secondary | ICD-10-CM

## 2022-01-01 DIAGNOSIS — R059 Cough, unspecified: Secondary | ICD-10-CM | POA: Diagnosis not present

## 2022-01-01 NOTE — Progress Notes (Signed)
Office Visit    Patient Name: Emily Wilson Date of Encounter: 01/01/2022  Primary Care Provider:  Dorna Mai, MD Primary Cardiologist:  Cristopher Peru, MD  Chief Complaint    56 year old female with a history of SVT, hypertension, diabetes, hyperlipidemia, and metastatic breast cancer, who presents for follow-up related to cough and dyspnea.  Past Medical History    Past Medical History:  Diagnosis Date   Anxiety state, unspecified    Arthritis    Breast cancer of lower-inner quadrant of right female breast [facility])    Carpal tunnel syndrome    Complication of anesthesia    woke up during ganglion cyst removal in her 20's, not woken up since   Diabetes mellitus without complication (HCC)    Type II   Diverticulosis    Heart murmur    History of radiation therapy 12/18/18- 01/30/19   Right Chest wall 25 fractions X 2Gy each to total 50 Gy, followed by a boost 10 Gy in 5 fractions.    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, without mention of thyrotoxic crisis or storm    Past Surgical History:  Procedure Laterality Date   ABDOMINAL HYSTERECTOMY     ANTERIOR CRUCIATE LIGAMENT REPAIR Right 2007   ACL repair   APPENDECTOMY     CARPAL TUNNEL RELEASE Right    GANGLION CYST EXCISION Right    TOTAL MASTECTOMY Right 11/16/2018   Procedure: RIGHT MASTECTOMY;  Surgeon: Coralie Keens, MD;  Location: [place];  Service: General;  Laterality: Right;   TUBAL LIGATION      Allergies  Allergies  Allergen Reactions   Doxycycline Shortness Of Breath    Wheezing, shortness of breath, rash head to toe, and swelling   Naproxen Shortness Of Breath   Oxycodone-Acetaminophen Itching   Codeine Hives   Hydrocodone-Acetaminophen Hives   Ibuprofen     Irritant to stomach r/t diverticulitis   Lantus [Insulin Glargine]      Yeast Infections   Meloxicam Other (See Comments)    Abdominal pain    Propoxyphene N-Acetaminophen Hives   Sulfa Antibiotics Hives   Sulfonamide Derivatives Hives   Tramadol Other (See Comments)    Abdominal Pain    History of Present Illness    56 year old female with a history of SVT, hypertension, diabetes, hyperlipidemia, and metastatic breast cancer.  She has been followed by Dr. Lovena Le in [place] and has been successfully managed with beta-blocker therapy in the setting of occasional, brief episodes of palpitations.  Ms. Hottle was last seen in cardiology clinic in February 2023, at which time she was doing well.  She was recently seen in the emergency department on November 6 due to cough and pleuritic chest discomfort.  Chest x-ray showed chronic interstitial changes without definite acute infiltrate.  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  She was treated with a steroid burst and Z-Pak and discharged.  Patient presents today due to ongoing concern related to cough.  She says that this has been going on for 4 to 5 months and that she has been through multiple rounds of antibiotics.  In the setting of cough, she has been experiencing pleuritic and musculoskeletal chest discomfort and tenderness as well as dyspnea on exertion.  She feels that she may be choking at times  and says that she can feel food going into her airways.  She says that she was told that she was aspirating but has never undergone a swallow study.  We looked at her most recent chest x-ray and CTs from earlier this year and discussed that there was no remark regarding evidence of aspiration on imaging.  In the setting of feeling poorly with ongoing cough and dyspnea, she has noted elevation in resting heart rates.  She has not necessarily experienced tachyarrhythmias however.  She is concerned that her heart is being impacted by her ongoing respiratory issues and is interested in  pursuing an echocardiogram.  She notes that it is difficult to lie flat because of coughing and so she has been sleeping sitting up.  She denies dizziness, syncope, edema, or early satiety.  Home Medications    Current Outpatient Medications  Medication Sig Dispense Refill   Accu-Chek FastClix Lancets MISC USE TO TEST BLOOD SUGAR UP TO FOUR TIMES DAILY AS DIRECTED 102 each 5   albuterol (VENTOLIN HFA) 108 (90 Base) MCG/ACT inhaler Inhale 1-2 puffs into the lungs every 6 (six) hours as needed for wheezing or shortness of breath. 8 g 1   azithromycin (ZITHROMAX) 250 MG tablet Take 1 tablet (250 mg total) by mouth daily. Take first 2 tablets together, then 1 every day until finished. 6 tablet 0   BD INSULIN SYRINGE U/F 31G X 5/16" 0.5 ML MISC USE TO ADMINISTER INSULIN 3 TIMES DAILY WITH MEALS 100 each 3   glucose blood (ACCU-CHEK GUIDE) test strip USE TO CHECK BLOOD SUGAR UP TO 4 TIMES A DAY (E11.65) 200 strip 2   insulin NPH Human (HUMULIN N) 100 UNIT/ML injection INJECT 12 UNITS INTO THE SKIN AT BEDTIME 10 mL 1   insulin regular (NOVOLIN R) 100 units/mL injection INJECT 12 UNITS TOTAL INTO THE SKIN 3X DAILY BEFORE MEALS. HOLD DOSE FOR BLOOD SUGAR LESS THAN 125 30 mL 3   Ipratropium-Albuterol (COMBIVENT RESPIMAT) 20-100 MCG/ACT AERS respimat Inhale 1 puff into the lungs every 6 (six) hours. 4 g 1   letrozole (FEMARA) 2.5 MG tablet TAKE 1 TABLET BY MOUTH EVERY DAY 90 tablet 3   metoprolol succinate (TOPROL-XL) 25 MG 24 hr tablet Take 1 tablet (25 mg total) by mouth daily. 90 tablet 3   nitrofurantoin, macrocrystal-monohydrate, (MACROBID) 100 MG capsule Take 1 capsule (100 mg total) by mouth daily. 30 capsule 1   predniSONE (DELTASONE) 20 MG tablet Take 2 tablets (40 mg total) by mouth daily with breakfast for 5 days. 10 tablet 0   alpelisib 200 mg daily dose (PIQRAY, 200 MG DAILY DOSE,) tablet Take 1 tablet (200 mg total) by mouth daily. Take with food daily. (Patient not taking: Reported on  11/24/2021) 28 tablet 0   bismuth subsalicylate (PEPTO BISMOL) 262 MG/15ML suspension Take 30 mLs by mouth every 6 (six) hours as needed. (Patient not taking: Reported on 11/24/2021)     denosumab (XGEVA) 120 MG/1.7ML SOLN injection Inject 120 mg into the skin every 3 (three) months. (Patient not taking: Reported on 12/08/2021)     diclofenac (VOLTAREN) 75 MG EC tablet Take 1 tablet (75 mg total) by mouth 2 (two) times daily as needed for mild pain or moderate pain. (Patient not taking: Reported on 12/08/2021) 60 tablet 3   dicyclomine (BENTYL) 20 MG tablet Take 1 tablet (20 mg total) by mouth every 6 (six) hours. (Patient not taking: Reported on 11/24/2021) 120 tablet 0   gabapentin (NEURONTIN) 100 MG capsule  Take 3 capsules (300 mg total) by mouth 3 (three) times daily. (Patient not taking: Reported on 12/02/2021) 90 capsule 1   HYDROmorphone (DILAUDID) 4 MG tablet Take 1 tablet (4 mg total) by mouth 4 (four) times daily as needed for severe pain. (Patient not taking: Reported on 12/02/2021) 20 tablet 0   loperamide (IMODIUM) 2 MG capsule Take 2 mg by mouth as needed for diarrhea or loose stools. Take 2 tabs (4 mg) with first loose stool, then 1 tab (2 mg) with each additional loose stool. Do not take more than 8 tabs (16 mg) in a 24-hour period. (Patient not taking: Reported on 11/24/2021)     meclizine (ANTIVERT) 25 MG tablet Take 1 tablet (25 mg total) by mouth 3 (three) times daily as needed for dizziness. (Patient not taking: Reported on 11/24/2021) 30 tablet 0   ondansetron (ZOFRAN ODT) 8 MG disintegrating tablet Take 1 tablet (8 mg total) by mouth every 8 (eight) hours as needed for nausea or vomiting. (Patient not taking: Reported on 12/02/2021) 20 tablet 1   rosuvastatin (CRESTOR) 40 MG tablet TAKE 1 TABLET BY MOUTH EVERY DAY (Patient not taking: Reported on 01/01/2022) 90 tablet 0   No current facility-administered medications for this visit.     Review of Systems    Feeling poorly over the  past several months with cough, dyspnea, orthopnea, elevations in heart rate/palpitations, pleuritic and musculoskeletal chest pain.  All other systems reviewed and are otherwise negative except as noted above.    Physical Exam    VS:  BP 138/86 (BP Location: Left Arm, Patient Position: Sitting, Cuff Size: Normal)   Pulse 95   Ht '4\' 11"'$  (1.499 m)   Wt 150 lb 9.6 oz (68.3 kg)   SpO2 98%   BMI 30.42 kg/m  , BMI Body mass index is 30.42 kg/m.     GEN: Well nourished, well developed, in no acute distress. HEENT: normal. Neck: Supple, no JVD, carotid bruits, or masses. Cardiac: RRR, no murmurs, rubs, or gallops. No clubbing, cyanosis, edema.  Radials/PT 2+ and equal bilaterally.  Respiratory:  Respirations regular and unlabored, clear to auscultation bilaterally. GI: Soft, nontender, nondistended, BS + x 4. MS: no deformity or atrophy. Skin: warm and dry, no rash. Neuro:  Strength and sensation are intact. Psych: Normal affect.  Accessory Clinical Findings    ECG personally reviewed by me today -regular sinus rhythm, 95, left atrial enlargement, septal infarct- no acute changes.  Lab Results  Component Value Date   WBC 6.5 11/17/2021   HGB 13.8 11/17/2021   HCT 42.1 11/17/2021   MCV 95.5 11/17/2021   PLT 311 11/17/2021   Lab Results  Component Value Date   CREATININE 0.59 11/17/2021   BUN 13 11/17/2021   NA 138 11/17/2021   K 4.0 11/17/2021   CL 104 11/17/2021   CO2 25 11/17/2021   Lab Results  Component Value Date   ALT 18 11/17/2021   AST 24 11/17/2021   ALKPHOS 123 11/17/2021   BILITOT 0.5 11/17/2021   Lab Results  Component Value Date   CHOL 307 (H) 11/11/2020   HDL 49 11/11/2020   LDLCALC 200 (H) 11/11/2020   LDLDIRECT 210.0 11/04/2015   TRIG 291 (H) 11/11/2020   CHOLHDL 6.3 (H) 11/11/2020    Lab Results  Component Value Date   HGBA1C 7.6 (A) 12/03/2021    Assessment & Plan    1.  Cough/dyspnea exertion/COPD: Patient with a several month history of  cough associated  with dyspnea on exertion, pleuritic/musculoskeletal chest discomfort, and orthopnea.  She quit smoking many years ago.  She notes that she has been on multiple rounds of antibiotics and is currently on a Z-Pak and prednisone.  She has been using albuterol inhaler more or less around-the-clock, and in the setting of symptoms and medications, she has been experiencing elevated resting heart rate.  She is in sinus rhythm today and is euvolemic on examination.  She does have a history of tachypalpitations/PSVT though has not had any sustained tachycardia that she is aware of.  Recent chest x-ray and CTs from earlier this year reviewed with her today.  She is concerned that she may be aspirating and I advised that she follow-up with her primary care provider as she will likely need a swallow eval.  With ongoing cough, she is interested in seeing pulmonology and I will make referral today.  Finally, I will arrange for 2D echocardiogram to evaluate EF and rule out potential cardiogenic contribution to ongoing cough and dyspnea.  2.  PSVT: Patient with a history of brief tachypalpitations which has been well managed with beta-blocker therapy.  As above, she has had some elevation of resting heart rate in the setting of feeling poorly, coughing, and inhalers, and more recently steroids.  She is in sinus rhythm today.  Reassurance offered.  Continue current dose of Toprol-XL.  We did discuss potentially increasing this but I think treatment of the underlying cause is more appropriate and with ongoing upper respiratory issues, titration of beta-blocker may only exacerbate these.  3.  Essential hypertension: Stable on beta-blocker therapy.  4.  Hyperlipidemia: LDL of 200 and September 2022.  Intolerant to statin therapy.  Consider PCSK9 inhibitor in the future if patient  willing to try, especially in light of coronary calcifications previously noted on CT and mild aortic atherosclerosis.  5.  Type 2  diabetes mellitus: A1c 7.6.  Managed by primary care.  6.  Musculoskeletal and pleuritic chest pain: In the setting of cough over the past several months.  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  Symptoms are worsened with palpation, deep breathing, and coughing.  We discussed that pending echo, we might consider ischemic evaluation though symptoms primarily sound musculoskeletal.  7.  Metastatic breast cancer: Followed closely by oncology with regular CT imaging.  8.  Disposition: Follow-up 2D echocardiogram.  Refer to pulmonology.  She has follow-up scheduled with Dr. Lovena Le in February.  Murray Hodgkins, NP 01/01/2022, 10:02 AM

## 2022-01-01 NOTE — Patient Instructions (Signed)
Medication Instructions:   Your physician recommends that you continue on your current medications as directed. Please refer to the Current Medication list given to you today.  *If you need a refill on your cardiac medications before your next appointment, please call your pharmacy*   Lab Work:  None Ordered  If you have labs (blood work) drawn today and your tests are completely normal, you will receive your results only by: [place] (if you have MyChart) OR A paper copy in the mail If you have any lab test that is abnormal or we need to change your treatment, we will call you to review the results.   Testing/Procedures:  Echocardiogram   Your physician has requested that you have an echocardiogram. Echocardiography is a painless test that uses sound waves to create images of your heart. It provides your doctor with information about the size and shape of your heart and how well your heart's chambers and valves are working. This procedure takes approximately one hour. There are no restrictions for this procedure. Please note; depending on visual quality an IV may need to be placed.     Follow-Up: At [facility], you and your health needs are our priority.  As part of our continuing mission to provide you with exceptional heart care, we have created designated Provider Care Teams.  These Care Teams include your primary Cardiologist (physician) and Advanced Practice Providers (APPs -  Physician Assistants and Nurse Practitioners) who all work together to provide you with the care you need, when you need it.  We recommend signing up for the patient portal called "MyChart".  Sign up information is provided on this After Visit Summary.  MyChart is used to connect with patients for Virtual Visits (Telemedicine).  Patients are able to view lab/test results, encounter notes, upcoming appointments, etc.  Non-urgent messages can be sent to your provider as well.   To learn more  about what you can do with MyChart, go to [email or web address].    Your next appointment:  As scheduled with Dr. Lovena Le

## 2022-01-05 ENCOUNTER — Ambulatory Visit: Payer: Self-pay | Admitting: *Deleted

## 2022-01-05 ENCOUNTER — Ambulatory Visit (INDEPENDENT_AMBULATORY_CARE_PROVIDER_SITE_OTHER): Payer: Medicare Other | Admitting: Family Medicine

## 2022-01-05 ENCOUNTER — Encounter: Payer: Self-pay | Admitting: Family Medicine

## 2022-01-05 VITALS — BP 125/86 | HR 113 | Temp 98.1°F | Resp 16 | Wt 148.8 lb

## 2022-01-05 DIAGNOSIS — R053 Chronic cough: Secondary | ICD-10-CM | POA: Diagnosis not present

## 2022-01-05 MED ORDER — HYDROCODONE BIT-HOMATROP MBR 5-1.5 MG/5ML PO SOLN: 5.0000 mL | ORAL | 0 refills | Status: DC | PRN

## 2022-01-05 NOTE — Telephone Encounter (Signed)
Reason for Disposition  Wheezing is present  Answer Assessment - Initial Assessment Questions 1. ONSET: "When did the cough begin?"      2-3 months 2. SEVERITY: "How bad is the cough today?"      Awake at night 3. SPUTUM: "Describe the color of your sputum" (none, dry cough; clear, white, yellow, green)     Dry 4. HEMOPTYSIS: "Are you coughing up any blood?" If so ask: "How much?" (flecks, streaks, tablespoons, etc.)     no 5. DIFFICULTY BREATHING: "Are you having difficulty breathing?" If Yes, ask: "How bad is it?" (e.g., mild, moderate, severe)    - MILD: No SOB at rest, mild SOB with walking, speaks normally in sentences, can lie down, no retractions, pulse < 100.    - MODERATE: SOB at rest, SOB with minimal exertion and prefers to sit, cannot lie down flat, speaks in phrases, mild retractions, audible wheezing, pulse 100-120.    - SEVERE: Very SOB at rest, speaks in single words, struggling to breathe, sitting hunched forward, retractions, pulse > 120      Cough triggers when lying down 6. FEVER: "Do you have a fever?" If Yes, ask: "What is your temperature, how was it measured, and when did it start?"     no 7. CARDIAC HISTORY: "Do you have any history of heart disease?" (e.g., heart attack, congestive heart failure)       8. LUNG HISTORY: "Do you have any history of lung disease?"  (e.g., pulmonary embolus, asthma, emphysema)     COPD 9. PE RISK FACTORS: "Do you have a history of blood clots?" (or: recent major surgery, recent prolonged travel, bedridden)      10. OTHER SYMPTOMS: "Do you have any other symptoms?" (e.g., runny nose, wheezing, chest pain)       Wheezing  at times, O2 levels 91% at lowest.  Protocols used: Cough - Acute Non-Productive-A-AH

## 2022-01-05 NOTE — Telephone Encounter (Signed)
  Chief Complaint: Worsening Cough Symptoms: Dry cough, wheezing at times, SOB with coughing "Ribs, shoulders, everything is sore from coughing." Seen at UC last week, completed course of ATB and prednisone, better for a while "Right back." Frequency: worsening 1 week Pertinent Negatives: Patient denies fever Disposition: '[]'$ ED /'[]'$ Urgent Care (no appt availability in office) / '[x]'$ Appointment(In office/virtual)/ '[]'$  [facility]/ '[]'$ Home Care/ '[]'$ Refused Recommended Disposition /'[]'$  Mobile Bus/ '[]'$  Follow-up with PCP Additional Notes: Appt secured for today at 1120.

## 2022-01-05 NOTE — Progress Notes (Unsigned)
Patient is here with c/o coughing that is stopping her from breathing during the night. Patient said the cough is dry and hurts rib with each cough.

## 2022-01-07 ENCOUNTER — Encounter: Payer: Self-pay | Admitting: Family Medicine

## 2022-01-07 NOTE — Progress Notes (Signed)
Established Patient Office Visit  Subjective    Patient ID: Emily Wilson, female    DOB: 31-Mar-1965  Age: 56 y.o. MRN: [id number]  CC:  Chief Complaint  Patient presents with   Cough    HPI Emily Wilson presents with complaint of cough persists in spite of various management. Patient denies fever/chills or viral sx.    Outpatient Encounter Medications as of 01/05/2022  Medication Sig   HYDROcodone bit-homatropine (HYCODAN) 5-1.5 MG/5ML syrup Take 5 mLs by mouth every 4 (four) hours as needed for cough.   Accu-Chek FastClix Lancets MISC USE TO TEST BLOOD SUGAR UP TO FOUR TIMES DAILY AS DIRECTED   albuterol (VENTOLIN HFA) 108 (90 Base) MCG/ACT inhaler Inhale 1-2 puffs into the lungs every 6 (six) hours as needed for wheezing or shortness of breath.   alpelisib 200 mg daily dose (PIQRAY, 200 MG DAILY DOSE,) tablet Take 1 tablet (200 mg total) by mouth daily. Take with food daily. (Patient not taking: Reported on 11/24/2021)   azithromycin (ZITHROMAX) 250 MG tablet Take 1 tablet (250 mg total) by mouth daily. Take first 2 tablets together, then 1 every day until finished.   BD INSULIN SYRINGE U/F 31G X 5/16" 0.5 ML MISC USE TO ADMINISTER INSULIN 3 TIMES DAILY WITH MEALS   bismuth subsalicylate (PEPTO BISMOL) 262 MG/15ML suspension Take 30 mLs by mouth every 6 (six) hours as needed. (Patient not taking: Reported on 11/24/2021)   denosumab (XGEVA) 120 MG/1.7ML SOLN injection Inject 120 mg into the skin every 3 (three) months. (Patient not taking: Reported on 12/08/2021)   diclofenac (VOLTAREN) 75 MG EC tablet Take 1 tablet (75 mg total) by mouth 2 (two) times daily as needed for mild pain or moderate pain. (Patient not taking: Reported on 12/08/2021)   dicyclomine (BENTYL) 20 MG tablet Take 1 tablet (20 mg total) by mouth every 6 (six) hours. (Patient not taking: Reported on 11/24/2021)   gabapentin (NEURONTIN) 100 MG capsule Take 3 capsules (300 mg total) by mouth 3 (three) times  daily. (Patient not taking: Reported on 12/02/2021)   glucose blood (ACCU-CHEK GUIDE) test strip USE TO CHECK BLOOD SUGAR UP TO 4 TIMES A DAY (E11.65)   HYDROmorphone (DILAUDID) 4 MG tablet Take 1 tablet (4 mg total) by mouth 4 (four) times daily as needed for severe pain. (Patient not taking: Reported on 12/02/2021)   insulin NPH Human (HUMULIN N) 100 UNIT/ML injection INJECT 12 UNITS INTO THE SKIN AT BEDTIME   insulin regular (NOVOLIN R) 100 units/mL injection INJECT 12 UNITS TOTAL INTO THE SKIN 3X DAILY BEFORE MEALS. HOLD DOSE FOR BLOOD SUGAR LESS THAN 125   Ipratropium-Albuterol (COMBIVENT RESPIMAT) 20-100 MCG/ACT AERS respimat Inhale 1 puff into the lungs every 6 (six) hours.   letrozole (FEMARA) 2.5 MG tablet TAKE 1 TABLET BY MOUTH EVERY DAY   loperamide (IMODIUM) 2 MG capsule Take 2 mg by mouth as needed for diarrhea or loose stools. Take 2 tabs (4 mg) with first loose stool, then 1 tab (2 mg) with each additional loose stool. Do not take more than 8 tabs (16 mg) in a 24-hour period. (Patient not taking: Reported on 11/24/2021)   meclizine (ANTIVERT) 25 MG tablet Take 1 tablet (25 mg total) by mouth 3 (three) times daily as needed for dizziness. (Patient not taking: Reported on 11/24/2021)   metoprolol succinate (TOPROL-XL) 25 MG 24 hr tablet Take 1 tablet (25 mg total) by mouth daily.   nitrofurantoin, macrocrystal-monohydrate, (MACROBID) 100 MG capsule Take  1 capsule (100 mg total) by mouth daily.   ondansetron (ZOFRAN ODT) 8 MG disintegrating tablet Take 1 tablet (8 mg total) by mouth every 8 (eight) hours as needed for nausea or vomiting. (Patient not taking: Reported on 12/02/2021)   rosuvastatin (CRESTOR) 40 MG tablet TAKE 1 TABLET BY MOUTH EVERY DAY (Patient not taking: Reported on 01/01/2022)   No facility-administered encounter medications on file as of 01/05/2022.    Past Medical History:  Diagnosis Date   Anxiety state, unspecified    Arthritis    Breast cancer of lower-inner  quadrant of right female breast [facility])    Carpal tunnel syndrome    Complication of anesthesia    woke up during ganglion cyst removal in her 20's, not woken up since   Diabetes mellitus without complication (HCC)    Type II   Diverticulosis    Heart murmur    History of radiation therapy 12/18/18- 01/30/19   Right Chest wall 25 fractions X 2Gy each to total 50 Gy, followed by a boost 10 Gy in 5 fractions.    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, without mention of thyrotoxic crisis or storm     Past Surgical History:  Procedure Laterality Date   ABDOMINAL HYSTERECTOMY     ANTERIOR CRUCIATE LIGAMENT REPAIR Right 2007   ACL repair   APPENDECTOMY     CARPAL TUNNEL RELEASE Right    GANGLION CYST EXCISION Right    TOTAL MASTECTOMY Right 11/16/2018   Procedure: RIGHT MASTECTOMY;  Surgeon: Coralie Keens, MD;  Location: [facility];  Service: General;  Laterality: Right;   TUBAL LIGATION      Family History  Problem Relation Age of Onset   Diabetes Mother    Bladder Cancer Mother 18       smoker   Other Mother 1       TAH for unspecified reason   Multiple sclerosis Mother    Kidney failure Mother    Cancer Father 17       lymphatic/tonsil cancer - in remission; former smoker   Diabetes Sister    Diabetes Sister    Heart attack Brother    COPD Maternal Grandmother        smoker   Emphysema Maternal Grandmother        smoker   Diabetes Maternal Grandmother    Stroke Maternal Grandfather    Diabetes Paternal Grandmother    Dementia Maternal Uncle    COPD Maternal Uncle        smoker   Multiple sclerosis Paternal Aunt    Breast cancer Paternal Aunt        dx. late 3s - early 17s    Social History   Socioeconomic History   Marital status: Divorced    Spouse name: Not on file   Number of children: 2   Years of  education: Not on file   Highest education level: Not on file  Occupational History   Occupation: Disabled  Tobacco Use   Smoking status: Former    Packs/day: 3.00    Years: 30.00    Total pack years: 90.00    Types: Cigarettes    Quit date: 10/24/2011    Years since quitting: 10.2   Smokeless tobacco: Never  Vaping Use   Vaping Use: Never used  Substance and Sexual Activity   Alcohol use: No   Drug use: Never   Sexual activity: Not Currently  Other Topics Concern   Not on file  Social History Narrative   In [place] w/boyfriend   Works Chiropractor, Scientist, water quality   Social Determinants of Health   Financial Resource Strain: Medium Risk (05/01/2021)   Overall Financial Resource Strain (CARDIA)    Difficulty of Paying Living Expenses: Somewhat hard  Food Insecurity: No Food Insecurity (05/01/2021)   Hunger Vital Sign    Worried About Running Out of Food in the Last Year: Never true    [place] in the Last Year: Never true  Transportation Needs: No Transportation Needs (05/01/2021)   PRAPARE - Hydrologist (Medical): No    Lack of Transportation (Non-Medical): No  Physical Activity: Inactive (05/01/2021)   Exercise Vital Sign    Days of Exercise per Week: 0 days    Minutes of Exercise per Session: 0 min  Stress: Stress Concern Present (05/01/2021)   [place]    Feeling of Stress : Very much  Social Connections: Socially Isolated (05/01/2021)   Social Connection and Isolation Panel [NHANES]    Frequency of Communication with Friends and Family: More than three times a week    Frequency of Social Gatherings with Friends and Family: More than three times a week    Attends Religious Services: Never    Marine scientist or Organizations: No    Attends Archivist Meetings: Never    Marital Status: Divorced  Human resources officer Violence: Not At Risk (05/01/2021)   Humiliation,  Afraid, Rape, and Kick questionnaire    Fear of Current or Ex-Partner: No    Emotionally Abused: No    Physically Abused: No    Sexually Abused: No    Review of Systems  Constitutional:  Negative for chills and fever.  Respiratory:  Positive for cough. Negative for shortness of breath.   Cardiovascular:  Negative for chest pain.  All other systems reviewed and are negative.       Objective    BP 125/86   Pulse (!) 113   Temp 98.1 F (36.7 C) (Oral)   Resp 16   Wt 148 lb 12.8 oz (67.5 kg)   SpO2 93%   BMI 30.05 kg/m   Physical Exam Vitals and nursing note reviewed.  Constitutional:      General: She is not in acute distress. Cardiovascular:     Rate and Rhythm: Normal rate and regular rhythm.  Pulmonary:     Effort: Pulmonary effort is normal. No respiratory distress.     Breath sounds: Normal breath sounds. No wheezing.  Neurological:     General: No focal deficit present.     Mental Status: She is alert and oriented to person, place, and time.         Assessment & Plan:   1. Persistent cough Hycodan syrup prescribed. Recommended for patient to follow up with oncologist  to review meds for possible "cough" side effect. Patient v.u.    No follow-ups on file.   Becky Sax, MD

## 2022-01-12 ENCOUNTER — Ambulatory Visit: Payer: Self-pay

## 2022-01-12 NOTE — Telephone Encounter (Signed)
Patient has been  given  appt

## 2022-01-12 NOTE — Telephone Encounter (Signed)
  Chief Complaint: Difficulty breathing cough Symptoms: pt has had ongoing breathing issues Frequency: ongoing Pertinent Negatives: Patient denies  Disposition: '[x]'$ ED /'[x]'$ Urgent Care (no appt availability in office) / '[]'$ Appointment(In office/virtual)/ '[]'$  [facility]/ '[]'$ Home Care/ '[x]'$ Refused Recommended Disposition /'[]'$ [facility]/ '[]'$  Follow-up with PCP Additional Notes: Pt has a hx of breathing issues. She has COPD. PT still has cough. Pt is using albuterol inhaler 3-4 time a day and using nebulizer 2x a day.  At night pt states that O2 goes down to 88-89. Pt does not want to go to ED. PT thinks this could be pleurisy.  Please advise.     Reason for Disposition  [1] MILD difficulty breathing (e.g., minimal/no SOB at rest, SOB with walking, pulse <100) AND [2] still present when not coughing  Answer Assessment - Initial Assessment Questions 1. ONSET: "When did the cough begin?"      Ongoing 2. SEVERITY: "How bad is the cough today?"      moderate 3. SPUTUM: "Describe the color of your sputum" (none, dry cough; clear, white, yellow, green)     none 4. HEMOPTYSIS: "Are you coughing up any blood?" If so ask: "How much?" (flecks, streaks, tablespoons, etc.)      5. DIFFICULTY BREATHING: "Are you having difficulty breathing?" If Yes, ask: "How bad is it?" (e.g., mild, moderate, severe)    - MILD: No SOB at rest, mild SOB with walking, speaks normally in sentences, can lie down, no retractions, pulse < 100.    - MODERATE: SOB at rest, SOB with minimal exertion and prefers to sit, cannot lie down flat, speaks in phrases, mild retractions, audible wheezing, pulse 100-120.    - SEVERE: Very SOB at rest, speaks in single words, struggling to breathe, sitting hunched forward, retractions, pulse > 120      mild 6. FEVER: "Do you have a fever?" If Yes, ask: "What is your temperature, how was it measured, and when did it start?"      7. CARDIAC HISTORY: "Do you have any history  of heart disease?" (e.g., heart attack, congestive heart failure)      yes 8. LUNG HISTORY: "Do you have any history of lung disease?"  (e.g., pulmonary embolus, asthma, emphysema)     yes 9. PE RISK FACTORS: "Do you have a history of blood clots?" (or: recent major surgery, recent prolonged travel, bedridden)      10. OTHER SYMPTOMS: "Do you have any other symptoms?" (e.g., runny nose, wheezing, chest pain)       Chest pain from coughing 11. PREGNANCY: "Is there any chance you are pregnant?" "When was your last menstrual period?"        12. TRAVEL: "Have you traveled out of the country in the last month?" (e.g., travel history, exposures)  Protocols used: Cough - Acute Non-Productive-A-AH

## 2022-01-12 NOTE — Progress Notes (Unsigned)
Patient ID: Emily Wilson, female   DOB: 1965-03-28, 56 y.o.   MRN: [id number]  After being seen at the [facility] 12/28/2021 Steroid burst and Z-Pak prescribed to cover COPD exacerbation.  No apparent pneumonia noted on x-ray.

## 2022-01-13 ENCOUNTER — Ambulatory Visit (INDEPENDENT_AMBULATORY_CARE_PROVIDER_SITE_OTHER): Payer: Medicare Other | Admitting: Physician Assistant

## 2022-01-13 VITALS — BP 119/64 | HR 106 | Temp 98.1°F | Resp 16 | Wt 148.0 lb

## 2022-01-13 DIAGNOSIS — M791 Myalgia, unspecified site: Secondary | ICD-10-CM

## 2022-01-13 DIAGNOSIS — R053 Chronic cough: Secondary | ICD-10-CM | POA: Diagnosis not present

## 2022-01-13 DIAGNOSIS — R0602 Shortness of breath: Secondary | ICD-10-CM | POA: Diagnosis not present

## 2022-01-13 DIAGNOSIS — Z794 Long term (current) use of insulin: Secondary | ICD-10-CM

## 2022-01-13 DIAGNOSIS — E1159 Type 2 diabetes mellitus with other circulatory complications: Secondary | ICD-10-CM | POA: Diagnosis not present

## 2022-01-13 DIAGNOSIS — M899 Disorder of bone, unspecified: Secondary | ICD-10-CM

## 2022-01-13 LAB — GLUCOSE, POCT (MANUAL RESULT ENTRY): POC Glucose: 233 mg/dl — AB (ref 70–99)

## 2022-01-13 MED ORDER — CYCLOBENZAPRINE HCL 5 MG PO TABS: 5.0000 mg | ORAL_TABLET | Freq: Every day | ORAL | 1 refills | Status: DC

## 2022-01-13 MED ORDER — FLUTICASONE-SALMETEROL 250-50 MCG/ACT IN AEPB: 1.0000 | INHALATION_SPRAY | Freq: Two times a day (BID) | RESPIRATORY_TRACT | 2 refills | Status: AC

## 2022-01-13 NOTE — Progress Notes (Signed)
Patient is her with c/o still having a persistent cough that has  been  present for a few days.  Patient said that cough  medication only make her sleepy and do not work.

## 2022-01-14 LAB — BRAIN NATRIURETIC PEPTIDE: BNP: 14.5 pg/mL (ref 0.0–100.0)

## 2022-01-21 ENCOUNTER — Ambulatory Visit: Payer: Self-pay

## 2022-01-22 NOTE — Patient Instructions (Signed)
Visit Information  Thank you for taking time to visit with me today. Please don't hesitate to contact me if I can be of assistance to you.   Following are the goals we discussed today:   Goals Addressed             This Visit's Progress    COMPLETED: Care Coordination Activites -No follow up required        Care Coordination Interventions: Active listening / Reflection utilized  Emotional Support Provided Problem [place] strategies reviewed Discussed/.Educated Care Coordination Program 2.   Discussed/.Educated Annual Wellness Visit 3.   Discussed/.Educated Social Determinates of Health 4.   Please inform PCP if services needed in the future         If you are experiencing a Mental Health or [place] or need someone to talk to, please call 1-800-273-TALK (toll free, 24 hour hotline)  The patient verbalized understanding of instructions, educational materials, and care plan provided today and agreed to receive a mailed copy of patient instructions, educational materials, and care plan.   Lazaro Arms RN, BSN, [facility]   Phone: [phone]

## 2022-01-22 NOTE — Patient Outreach (Signed)
  Care Coordination   Initial Visit Note   01/21/2022 Name: Emily Wilson MRN: [id number] DOB: 1965-08-26  Emily Wilson is a 56 y.o. year old female who sees Dorna Mai, MD for primary care. I spoke with  Emily Wilson by phone today.  What matters to the patients health and wellness today?  I spoke with Emily Wilson, and she stated that she is managing her condition at this moment with her physician but appreciated the call. However, in the future, if she needs our services, she will be in touch.    Goals Addressed             This Visit's Progress    COMPLETED: Care Coordination Activites -No follow up required        Care Coordination Interventions: Active listening / Reflection utilized  Emotional Support Provided Problem [place] strategies reviewed Discussed/.Educated Care Coordination Program 2.   Discussed/.Educated Annual Wellness Visit 3.   Discussed/.Educated Social Determinates of Health 4.   Please inform PCP if services needed in the future         SDOH assessments and interventions completed:  Yes  SDOH Interventions Today    Flowsheet Row Most Recent Value  SDOH Interventions   Food Insecurity Interventions Intervention Not Indicated  Transportation Interventions Intervention Not Indicated        Care Coordination Interventions:  Yes, provided   Follow up plan: No further intervention required.   Encounter Outcome:  Pt. Visit Completed   Lazaro Arms RN, BSN, [facility]   Phone: [phone]

## 2022-01-23 ENCOUNTER — Other Ambulatory Visit: Payer: Self-pay | Admitting: Family

## 2022-01-23 DIAGNOSIS — N824 Other female intestinal-genital tract fistulae: Secondary | ICD-10-CM

## 2022-01-23 NOTE — Progress Notes (Signed)
Patient Care Team: Dorna Mai, MD as PCP - General (Family Medicine) [name], MD as PCP - Cardiology (Cardiology) Eppie Gibson, MD as Attending Physician (Radiation Oncology) [name], MD as Consulting Physician (Hematology and Oncology) Loletha Carrow Kirke Corin, MD as Consulting Physician (Gastroenterology) Delice Bison, Charlestine Massed, NP as Nurse Practitioner (Hematology and Oncology) [name], RPH-CPP (Pharmacist) Lazaro Arms, RN as [place] [facility]  DIAGNOSIS: No diagnosis found.  SUMMARY OF ONCOLOGIC HISTORY: Oncology History  Malignant neoplasm of lower-inner quadrant of right breast of female, estrogen receptor positive ([place])  02/19/2015 Mammogram   Right breast irregular mass lower inner quadrant 2.2 x 1.3 x 1.7 cm with architectural distortion and skin/nipple retraction, T2 N0 stage II a clinical stage, additional benign cysts largest 1.2 cm   02/20/2015 Initial Diagnosis   Right breast biopsy 5:00: Invasive ductal carcinoma grade 2, perineural invasion present, ER 90%, PR 70%, HER-2 negative ratio 0.95, Ki-67 15%    03/07/2015 Breast MRI   right breast inferior subareolar mass measuring 2.2 x 2.4 x 2.5 cm with skin and nipple enhancement, extending inferiorly and laterally is intraductal enhancement over a distance of 4 cm, several level I right axillary lymph nodes with cortical thickening   03/13/2015 -  Anti-estrogen oral therapy   Neoadjuvant antiestrogen therapy with tamoxifen 20 mg daily (because the patient did not want to undergo surgery immediately for multiple family reasons) stopped in late 2017; anastrozole started 05/11/18   05/23/2018 Imaging   CT CAP: 2.9 cm spiculated soft tissue density central right breast, no evidence of soft tissue metastatic disease within the chest abdomen pelvis.  Subcentimeter sclerotic bone lesions T10, left pubis, right posterior ilium Bone scan: Foci of uptake left frontal calvarium, T-spine,  right ilium   10/04/2018 - 02/2021 Anti-estrogen oral therapy    Anastrozole with Delton See started April 2020 Ibrance added 09/29/2018, Faslodex 11/20/2020 discontinued January 2023 (due to progression and toxicity)   11/16/2018 Surgery   Right mastectomy Ninfa Linden): IDC, grade 1, 5.2cm, grossly involving underlying skin, clear margins.   12/07/2018 Cancer Staging   Staging form: Breast, AJCC 7th Edition - Pathologic stage from 12/07/2018: Stage IV (yT4b, NX, M1) - Signed by Eppie Gibson, MD on 12/07/2018   12/18/2018 - 01/30/2019 Radiation Therapy   12/18/2018 through 01/30/2019 Site Technique Total Dose (Gy) Dose per Fx (Gy) Completed Fx Beam Energies  Chest Wall, Right: CW_Rt 3D 50/50 2 25/25 10X, 6X  Chest Wall, Right: CW_Rt_SCV_PAB 3D 50/50 2 25/25 6X, 10X  Chest Wall, Right: CW_Rt_Bst Electron 10/10 2 5/5 6E    05/16/2019 Miscellaneous   [facility] molecular testing: ER positive, PI K3 CA mutation present, HER-2 negative, ER positive TMB low BRCA1 not detected, ESR 1 not detected, PD-L1 negative   11/19/2020 - 12/02/2020 Radiation Therapy   11/19/2020 through 12/02/2020 Site Technique Total Dose (Gy) Dose per Fx (Gy) Completed Fx Beam Energies  Thoracic Spine: Spine_ T8-T11 3D 30/30 3 10/10 10X, 15X  Femur Left: Ext_Lt_femur Complex 30/30 3 10/10 10X  Lumbar Spine: Spine_L4-S4 3D 30/30 3 10/10 10X, 15X    04/11/2021 Treatment Plan Change   Verzinio with letrozole along with Xgeva started 04/11/2021   06/16/2021 Imaging   CT chest/abd/pelivs w/contrast  IMPRESSION: 1. No evidence of breast cancer carcinoma progression. 2. New linear interstitial thickening and bronchiectasis in the medial RIGHT lower lobe. Query radiation treatment or aspiration. 3. Multifocal skeletal metastasis involving the pelvis, spine, ribs and shoulder girdles. No interval change.  CHIEF COMPLIANT: Follow-up to discuss treatment options  INTERVAL HISTORY: Emily Wilson is a 56 y.o female with a history of  right breast cancer. Currently on letrozole. She presents to the clinic for a follow-up to discuss treatment options.    ALLERGIES:  is allergic to doxycycline, naproxen, oxycodone-acetaminophen, codeine, hydrocodone-acetaminophen, ibuprofen, lantus [insulin glargine], meloxicam, propoxyphene n-acetaminophen, sulfa antibiotics, sulfonamide derivatives, and tramadol.  MEDICATIONS:  Current Outpatient Medications  Medication Sig Dispense Refill   Accu-Chek FastClix Lancets MISC USE TO TEST BLOOD SUGAR UP TO FOUR TIMES DAILY AS DIRECTED 102 each 5   albuterol (VENTOLIN HFA) 108 (90 Base) MCG/ACT inhaler Inhale 1-2 puffs into the lungs every 6 (six) hours as needed for wheezing or shortness of breath. 8 g 1   alpelisib 200 mg daily dose (PIQRAY, 200 MG DAILY DOSE,) tablet Take 1 tablet (200 mg total) by mouth daily. Take with food daily. (Patient not taking: Reported on 11/24/2021) 28 tablet 0   azithromycin (ZITHROMAX) 250 MG tablet Take 1 tablet (250 mg total) by mouth daily. Take first 2 tablets together, then 1 every day until finished. (Patient not taking: Reported on 01/21/2022) 6 tablet 0   BD INSULIN SYRINGE U/F 31G X 5/16" 0.5 ML MISC USE TO ADMINISTER INSULIN 3 TIMES DAILY WITH MEALS 100 each 3   bismuth subsalicylate (PEPTO BISMOL) 262 MG/15ML suspension Take 30 mLs by mouth every 6 (six) hours as needed. (Patient not taking: Reported on 11/24/2021)     cyclobenzaprine (FLEXERIL) 5 MG tablet Take 1 tablet (5 mg total) by mouth at bedtime. Prn muscle pain 30 tablet 1   denosumab (XGEVA) 120 MG/1.7ML SOLN injection Inject 120 mg into the skin every 3 (three) months.     diclofenac (VOLTAREN) 75 MG EC tablet Take 1 tablet (75 mg total) by mouth 2 (two) times daily as needed for mild pain or moderate pain. 60 tablet 3   dicyclomine (BENTYL) 20 MG tablet Take 1 tablet (20 mg total) by mouth every 6 (six) hours. (Patient not taking: Reported on 11/24/2021) 120 tablet 0   fluticasone-salmeterol  (ADVAIR DISKUS) 250-50 MCG/ACT AEPB Inhale 1 puff into the lungs in the morning and at bedtime. 60 each 2   gabapentin (NEURONTIN) 100 MG capsule Take 3 capsules (300 mg total) by mouth 3 (three) times daily. 90 capsule 1   glucose blood (ACCU-CHEK GUIDE) test strip USE TO CHECK BLOOD SUGAR UP TO 4 TIMES A DAY (E11.65) 200 strip 2   HYDROcodone bit-homatropine (HYCODAN) 5-1.5 MG/5ML syrup Take 5 mLs by mouth every 4 (four) hours as needed for cough. 473 mL 0   HYDROmorphone (DILAUDID) 4 MG tablet Take 1 tablet (4 mg total) by mouth 4 (four) times daily as needed for severe pain. (Patient not taking: Reported on 12/02/2021) 20 tablet 0   insulin NPH Human (HUMULIN N) 100 UNIT/ML injection INJECT 12 UNITS INTO THE SKIN AT BEDTIME 10 mL 1   insulin regular (NOVOLIN R) 100 units/mL injection INJECT 12 UNITS TOTAL INTO THE SKIN 3X DAILY BEFORE MEALS. HOLD DOSE FOR BLOOD SUGAR LESS THAN 125 30 mL 3   Ipratropium-Albuterol (COMBIVENT RESPIMAT) 20-100 MCG/ACT AERS respimat Inhale 1 puff into the lungs every 6 (six) hours. 4 g 1   letrozole (FEMARA) 2.5 MG tablet TAKE 1 TABLET BY MOUTH EVERY DAY 90 tablet 3   loperamide (IMODIUM) 2 MG capsule Take 2 mg by mouth as needed for diarrhea or loose stools. Take 2 tabs (4 mg) with first loose stool,  then 1 tab (2 mg) with each additional loose stool. Do not take more than 8 tabs (16 mg) in a 24-hour period. (Patient not taking: Reported on 11/24/2021)     meclizine (ANTIVERT) 25 MG tablet Take 1 tablet (25 mg total) by mouth 3 (three) times daily as needed for dizziness. (Patient not taking: Reported on 11/24/2021) 30 tablet 0   metoprolol succinate (TOPROL-XL) 25 MG 24 hr tablet Take 1 tablet (25 mg total) by mouth daily. 90 tablet 3   nitrofurantoin, macrocrystal-monohydrate, (MACROBID) 100 MG capsule Take 1 capsule (100 mg total) by mouth daily. 30 capsule 1   ondansetron (ZOFRAN ODT) 8 MG disintegrating tablet Take 1 tablet (8 mg total) by mouth every 8 (eight) hours  as needed for nausea or vomiting. (Patient not taking: Reported on 12/02/2021) 20 tablet 1   rosuvastatin (CRESTOR) 40 MG tablet TAKE 1 TABLET BY MOUTH EVERY DAY (Patient not taking: Reported on 01/01/2022) 90 tablet 0   No current facility-administered medications for this visit.    PHYSICAL EXAMINATION: ECOG PERFORMANCE STATUS: {CHL ONC ECOG [phone]}  There were no vitals filed for this visit. There were no vitals filed for this visit.  BREAST:*** No palpable masses or nodules in either right or left breasts. No palpable axillary supraclavicular or infraclavicular adenopathy no breast tenderness or nipple discharge. (exam performed in the presence of a chaperone)  LABORATORY DATA:  I have reviewed the data as listed    Latest Ref Rng & Units 11/17/2021    4:47 AM 11/16/2021    3:40 PM 10/06/2021    9:21 AM  CMP  Glucose 70 - 99 mg/dL 222  188  172   BUN 6 - 20 mg/dL _0 Creatinine 0.44 - 1.00 mg/dL 0.59  0.67  0.90   Sodium 135 - 145 mmol/L 138  133  141   Potassium 3.5 - 5.1 mmol/L 4.0  3.9  4.0   Chloride 98 - 111 mmol/L 104  101  110   CO2 22 - 32 mmol/L _1 Calcium 8.9 - 10.3 mg/dL 9.1  8.6  9.5   Total Protein 6.5 - 8.1 g/dL 6.7  7.1  7.1   Total Bilirubin 0.3 - 1.2 mg/dL 0.5  0.6  0.5   Alkaline Phos 38 - 126 U/L 123  122  106   AST 15 - 41 U/L _2 ALT 0 - 44 U/L _3 Lab Results  Component Value Date   WBC 6.5 11/17/2021   HGB 13.8 11/17/2021   HCT 42.1 11/17/2021   MCV 95.5 11/17/2021   PLT 311 11/17/2021   NEUTROABS 4.5 11/17/2021    ASSESSMENT & PLAN:  No problem-specific Assessment & Plan notes found for this encounter.    No orders of the defined types were placed in this encounter.  The patient has a good understanding of the overall plan. she agrees with it. she will call with any problems that may develop before the next visit here. Total time spent: 30 mins including face to face time and time spent for  planning, charting and co-ordination of care   Suzzette Righter, [place] 01/23/22    I Gardiner Coins am scribing for Dr. Lindi Adie  ***

## 2022-01-25 ENCOUNTER — Other Ambulatory Visit: Payer: Self-pay | Admitting: *Deleted

## 2022-01-25 ENCOUNTER — Inpatient Hospital Stay: Payer: Medicare Other | Attending: Hematology and Oncology

## 2022-01-25 ENCOUNTER — Inpatient Hospital Stay (HOSPITAL_BASED_OUTPATIENT_CLINIC_OR_DEPARTMENT_OTHER): Payer: Medicare Other | Admitting: Hematology and Oncology

## 2022-01-25 ENCOUNTER — Other Ambulatory Visit: Payer: Self-pay

## 2022-01-25 ENCOUNTER — Inpatient Hospital Stay: Payer: Medicare Other

## 2022-01-25 VITALS — BP 140/84 | HR 118 | Temp 97.7°F | Resp 17 | Ht 59.0 in | Wt 146.8 lb

## 2022-01-25 DIAGNOSIS — R059 Cough, unspecified: Secondary | ICD-10-CM | POA: Insufficient documentation

## 2022-01-25 DIAGNOSIS — C7951 Secondary malignant neoplasm of bone: Secondary | ICD-10-CM | POA: Diagnosis present

## 2022-01-25 DIAGNOSIS — Z923 Personal history of irradiation: Secondary | ICD-10-CM | POA: Insufficient documentation

## 2022-01-25 DIAGNOSIS — Z79899 Other long term (current) drug therapy: Secondary | ICD-10-CM | POA: Insufficient documentation

## 2022-01-25 DIAGNOSIS — C50311 Malignant neoplasm of lower-inner quadrant of right female breast: Secondary | ICD-10-CM

## 2022-01-25 DIAGNOSIS — N823 Fistula of vagina to large intestine: Secondary | ICD-10-CM | POA: Insufficient documentation

## 2022-01-25 DIAGNOSIS — Z9011 Acquired absence of right breast and nipple: Secondary | ICD-10-CM | POA: Diagnosis not present

## 2022-01-25 DIAGNOSIS — N824 Other female intestinal-genital tract fistulae: Secondary | ICD-10-CM | POA: Diagnosis not present

## 2022-01-25 DIAGNOSIS — Z170 Estrogen receptor positive status [ER+]: Secondary | ICD-10-CM

## 2022-01-25 DIAGNOSIS — Z79811 Long term (current) use of aromatase inhibitors: Secondary | ICD-10-CM | POA: Diagnosis not present

## 2022-01-25 DIAGNOSIS — R739 Hyperglycemia, unspecified: Secondary | ICD-10-CM

## 2022-01-25 LAB — CMP (CANCER CENTER ONLY)
ALT: 32 U/L (ref 0–44)
AST: 47 U/L — ABNORMAL HIGH (ref 15–41)
Albumin: 3.8 g/dL (ref 3.5–5.0)
Alkaline Phosphatase: 241 U/L — ABNORMAL HIGH (ref 38–126)
Anion gap: 11 (ref 5–15)
BUN: 16 mg/dL (ref 6–20)
CO2: 27 mmol/L (ref 22–32)
Calcium: 14.1 mg/dL (ref 8.9–10.3)
Chloride: 99 mmol/L (ref 98–111)
Creatinine: 1 mg/dL (ref 0.44–1.00)
GFR, Estimated: >60 mL/min (ref >60)
Glucose, Bld: 244 mg/dL — ABNORMAL HIGH (ref 70–99)
Potassium: 3.8 mmol/L (ref 3.5–5.1)
Sodium: 137 mmol/L (ref 135–145)
Total Bilirubin: 0.3 mg/dL (ref 0.3–1.2)
Total Protein: 7.8 g/dL (ref 6.5–8.1)

## 2022-01-25 LAB — CBC WITH DIFFERENTIAL (CANCER CENTER ONLY)
Abs Immature Granulocytes: 0.06 10*3/uL (ref 0.00–0.07)
Basophils Absolute: 0.1 10*3/uL (ref 0.0–0.1)
Basophils Relative: 1 %
Eosinophils Absolute: 0.2 10*3/uL (ref 0.0–0.5)
Eosinophils Relative: 3 %
HCT: 39.1 % (ref 36.0–46.0)
Hemoglobin: 13.2 g/dL (ref 12.0–15.0)
Immature Granulocytes: 1 %
Lymphocytes Relative: 13 %
Lymphs Abs: 0.8 10*3/uL (ref 0.7–4.0)
MCH: 31.6 pg (ref 26.0–34.0)
MCHC: 33.8 g/dL (ref 30.0–36.0)
MCV: 93.5 fL (ref 80.0–100.0)
Monocytes Absolute: 1 10*3/uL (ref 0.1–1.0)
Monocytes Relative: 16 %
Neutro Abs: 4.1 10*3/uL (ref 1.7–7.7)
Neutrophils Relative %: 66 %
Platelet Count: 326 10*3/uL (ref 150–400)
RBC: 4.18 MIL/uL (ref 3.87–5.11)
RDW: 13 % (ref 11.5–15.5)
WBC Count: 6.1 10*3/uL (ref 4.0–10.5)
nRBC: 0 % (ref 0.0–0.2)

## 2022-01-25 MED ORDER — NITROFURANTOIN MONOHYD MACRO 100 MG PO CAPS: 100.0000 mg | ORAL_CAPSULE | Freq: Every day | ORAL | 1 refills | Status: DC

## 2022-01-25 MED ORDER — DENOSUMAB 120 MG/1.7ML ~~LOC~~ SOLN
120.0000 mg | Freq: Once | SUBCUTANEOUS | Status: AC
  Administered 2022-01-25: 120 mg via SUBCUTANEOUS
  Filled 2022-01-25: qty 1.7

## 2022-01-25 NOTE — Progress Notes (Signed)
Pt CA 14.1.  Per MD okay for pt to receive Xgeva today.  Verbal orders received for pt to undergo CT CAP and f/u in office in 2 weeks with repeat labs.  Orders placed, appt scheduled, pt notified and verbalized understanding.

## 2022-01-25 NOTE — Assessment & Plan Note (Addendum)
02/19/2015:2.2 x 1.3 x 1.7 cm with architectural distortion and skin/nipple retraction, T2 N0 stage II a clinical stage, grade 2 IDC ER 90%, PR 20%, HER2 negative, Ki-67 15%   11/16/2018:Right mastectomy Emily Wilson) performed because the tumor was fungating: IDC, grade 1, 5.2cm, grossly involving underlying skin, clear margins. 12/19/18- 01/30/19: Radiation to chest wall   Bone scan 06/26/2019: Stable distribution of bone metastases. CT CAP 04/28/2020: Mixed lytic and sclerotic bone metastases several demonstrating increased lytic character concern for worsening bone mets. 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  Continue with letrozole I sent a message to Dr. Nadeen Landau to see if her surgery date can be moved up because I am concerned about progression of metastatic breast cancer.  She has 2 subcutaneous nodules that are concerning for progression of metastatic breast cancer.  Dry cough: Patient is seeing pulmonary on 01/27/2022. Return to clinic in January.  Today she receives injection of Xgeva.

## 2022-01-25 NOTE — Patient Instructions (Signed)
Denosumab Injection (Oncology) What is this medication? DENOSUMAB (den oh SUE mab) prevents weakened bones caused by cancer. It may also be used to treat noncancerous bone tumors that cannot be removed by surgery. It can also be used to treat high calcium levels in the blood caused by cancer. It works by blocking a protein that causes bones to break down quickly. This slows down the release of calcium from bones, which lowers calcium levels in your blood. It also makes your bones stronger and less likely to break (fracture). This medicine may be used for other purposes; ask your health care provider or pharmacist if you have questions. COMMON BRAND NAME(S): XGEVA What should I tell my care team before I take this medication? They need to know if you have any of these conditions: Dental disease Having surgery or tooth extraction Infection Kidney disease Low levels of calcium or vitamin D in the blood Malnutrition On hemodialysis Skin conditions or sensitivity Thyroid or parathyroid disease An unusual reaction to denosumab, other medications, foods, dyes, or preservatives Pregnant or trying to get pregnant Breast-feeding How should I use this medication? This medication is for injection under the skin. It is given by your care team in a hospital or clinic setting. A special MedGuide will be given to you before each treatment. Be sure to read this information carefully each time. Talk to your care team about the use of this medication in children. While it may be prescribed for children as young as 13 years for selected conditions, precautions do apply. Overdosage: If you think you have taken too much of this medicine contact a poison control center or emergency room at once. NOTE: This medicine is only for you. Do not share this medicine with others. What if I miss a dose? Keep appointments for follow-up doses. It is important not to miss your dose. Call your care team if you are unable to  keep an appointment. What may interact with this medication? Do not take this medication with any of the following: Other medications containing denosumab This medication may also interact with the following: Medications that lower your chance of fighting infection Steroid medications, such as prednisone or cortisone This list may not describe all possible interactions. Give your health care provider a list of all the medicines, herbs, non-prescription drugs, or dietary supplements you use. Also tell them if you smoke, drink alcohol, or use illegal drugs. Some items may interact with your medicine. What should I watch for while using this medication? Your condition will be monitored carefully while you are receiving this medication. You may need blood work while taking this medication. This medication may increase your risk of getting an infection. Call your care team for advice if you get a fever, chills, sore throat, or other symptoms of a cold or flu. Do not treat yourself. Try to avoid being around people who are sick. You should make sure you get enough calcium and vitamin D while you are taking this medication, unless your care team tells you not to. Discuss the foods you eat and the vitamins you take with your care team. Some people who take this medication have severe bone, joint, or muscle pain. This medication may also increase your risk for jaw problems or a broken thigh bone. Tell your care team right away if you have severe pain in your jaw, bones, joints, or muscles. Tell your care team if you have any pain that does not go away or that gets worse. Talk   to your care team if you may be pregnant. Serious birth defects can occur if you take this medication during pregnancy and for 5 months after the last dose. You will need a negative pregnancy test before starting this medication. Contraception is recommended while taking this medication and for 5 months after the last dose. Your care team  can help you find the option that works for you. What side effects may I notice from receiving this medication? Side effects that you should report to your care team as soon as possible: Allergic reactions--skin rash, itching, hives, swelling of the face, lips, tongue, or throat Bone, joint, or muscle pain Low calcium level--muscle pain or cramps, confusion, tingling, or numbness in the hands or feet Osteonecrosis of the jaw--pain, swelling, or redness in the mouth, numbness of the jaw, poor healing after dental work, unusual discharge from the mouth, visible bones in the mouth Side effects that usually do not require medical attention (report to your care team if they continue or are bothersome): Cough Diarrhea Fatigue Headache Nausea This list may not describe all possible side effects. Call your doctor for medical advice about side effects. You may report side effects to FDA at 1-800-FDA-1088. Where should I keep my medication? This medication is given in a hospital or clinic. It will not be stored at home. NOTE: This sheet is a summary. It may not cover all possible information. If you have questions about this medicine, talk to your doctor, pharmacist, or health care provider.  2023 Elsevier/Gold Standard (2021-06-29 00:00:00)  

## 2022-01-26 LAB — HEMOGLOBIN A1C
Hgb A1c MFr Bld: 8.9 % — ABNORMAL HIGH (ref 4.8–5.6)
Mean Plasma Glucose: 209 mg/dL

## 2022-01-26 LAB — CANCER ANTIGEN 27.29: CA 27.29: 1804.1 U/mL — ABNORMAL HIGH (ref 0.0–38.6)

## 2022-01-27 ENCOUNTER — Ambulatory Visit (INDEPENDENT_AMBULATORY_CARE_PROVIDER_SITE_OTHER): Payer: Medicare Other | Admitting: Student in an Organized Health Care Education/Training Program

## 2022-01-27 ENCOUNTER — Encounter: Payer: Self-pay | Admitting: Student in an Organized Health Care Education/Training Program

## 2022-01-27 VITALS — BP 136/80 | HR 106 | Temp 97.7°F | Ht <= 58 in | Wt 144.2 lb

## 2022-01-27 DIAGNOSIS — R0602 Shortness of breath: Secondary | ICD-10-CM | POA: Diagnosis not present

## 2022-01-27 MED ORDER — INCRUSE ELLIPTA 62.5 MCG/ACT IN AEPB: 1.0000 | INHALATION_SPRAY | Freq: Every day | RESPIRATORY_TRACT | 11 refills | Status: DC

## 2022-01-27 NOTE — Progress Notes (Signed)
Synopsis: Referred in for shortness of breath by Berge, Larson:   1. Shortness of breath  She is presenting today for worsening shortness of breath over the past 3 months with recurrent episodes of exacerbations suggestive of a reactive airway/obstructive process.  I reviewed her CT scan of the chest from August 2023 that is notable for emphysema.  There is no signs of radiation pneumonitis or other parenchymal process on the CT.  She has a repeat CT of the chest/abdomen/pelvis scheduled for mid December (next week) that I will be reviewing as well.  For workup of her shortness of breath, given her normal lung exam, will include a pulmonary function test with spirometry/lung volumes/DLCO.  Given she has significant emphysema on her chest CT and she has a history of smoking, I suspect her symptoms are secondary to a COPD exacerbation. I will add an anticholinergic to her regimen of Advair.  She will be on ICS/LABA/LAMA in addition to as needed Combivent.  Other differentials include drug induced lung injury. 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 Fulvestrant has been associated with cough and dyspnea as well as the development of eosinophilic pneumonia.  She does not have any eosinophilia on her most recent CBC from 2 days ago. Letrozole is also associated with the development of pneumonitis. Finally, she is on Nitrofurantoin which is associated with ILD. She was previously on Abemaciclib which is associated with pneumonitis, eosinophilic pneumonia, NSIP, and ARDS. I have not seen signs of ILD on her previous chest CT, and will await the repeated chest CT to evaluate her lung parenchyma. Should there be suggestion of parenchymal involvement, she would benefit from a high resolution Chest CT and possibly bronchoscopy with biopsies to establish a diagnosis.  - Pulmonary Function  Test [facility] Only; Future - umeclidinium bromide (INCRUSE ELLIPTA) 62.5 MCG/ACT AEPB; Inhale 1 puff into the lungs daily.  Dispense: 30 each; Refill: 11   Return in about 2 months (around 03/30/2022).  I spent 60 minutes caring for this patient today, including preparing to see the patient, obtaining a medical history , reviewing a separately obtained history, performing a medically appropriate examination and/or evaluation, counseling and educating the patient/family/caregiver, ordering medications, tests, or procedures, documenting clinical information in the electronic health record, and independently interpreting results (not separately reported/billed) and communicating results to the patient/family/caregiver  Armando Reichert, MD [facility] 01/27/2022 12:09 PM    End of visit medications:  Meds ordered this encounter  Medications   umeclidinium bromide (INCRUSE ELLIPTA) 62.5 MCG/ACT AEPB    Sig: Inhale 1 puff into the lungs daily.    Dispense:  30 each    Refill:  11     Current Outpatient Medications:    Accu-Chek FastClix Lancets MISC, USE TO TEST BLOOD SUGAR UP TO FOUR TIMES DAILY AS DIRECTED, Disp: 102 each, Rfl: 5   albuterol (VENTOLIN HFA) 108 (90 Base) MCG/ACT inhaler, Inhale 1-2 puffs into the lungs every 6 (six) hours as needed for wheezing or shortness of breath., Disp: 8 g, Rfl: 1   alpelisib 200 mg daily dose (PIQRAY, 200 MG DAILY DOSE,) tablet, Take 1 tablet (200 mg total) by mouth daily. Take with food daily., Disp: 28 tablet, Rfl: 0   BD INSULIN SYRINGE U/F 31G X 5/16" 0.5 ML MISC, USE TO ADMINISTER INSULIN 3 TIMES DAILY WITH MEALS, Disp: 100 each, Rfl: 3  cyclobenzaprine (FLEXERIL) 5 MG tablet, Take 1 tablet (5 mg total) by mouth at bedtime. Prn muscle pain, Disp: 30 tablet, Rfl: 1   denosumab (XGEVA) 120 MG/1.7ML SOLN injection, Inject 120 mg into the skin every 3 (three) months., Disp: , Rfl:    diclofenac (VOLTAREN) 75 MG EC tablet, Take 1 tablet  (75 mg total) by mouth 2 (two) times daily as needed for mild pain or moderate pain., Disp: 60 tablet, Rfl: 3   fluticasone-salmeterol (ADVAIR DISKUS) 250-50 MCG/ACT AEPB, Inhale 1 puff into the lungs in the morning and at bedtime., Disp: 60 each, Rfl: 2   gabapentin (NEURONTIN) 100 MG capsule, Take 3 capsules (300 mg total) by mouth 3 (three) times daily., Disp: 90 capsule, Rfl: 1   glucose blood (ACCU-CHEK GUIDE) test strip, USE TO CHECK BLOOD SUGAR UP TO 4 TIMES A DAY (E11.65), Disp: 200 strip, Rfl: 2   HYDROcodone bit-homatropine (HYCODAN) 5-1.5 MG/5ML syrup, Take 5 mLs by mouth every 4 (four) hours as needed for cough., Disp: 473 mL, Rfl: 0   insulin NPH Human (HUMULIN N) 100 UNIT/ML injection, INJECT 12 UNITS INTO THE SKIN AT BEDTIME, Disp: 10 mL, Rfl: 1   insulin regular (NOVOLIN R) 100 units/mL injection, INJECT 12 UNITS TOTAL INTO THE SKIN 3X DAILY BEFORE MEALS. HOLD DOSE FOR BLOOD SUGAR LESS THAN 125, Disp: 30 mL, Rfl: 3   Ipratropium-Albuterol (COMBIVENT RESPIMAT) 20-100 MCG/ACT AERS respimat, Inhale 1 puff into the lungs every 6 (six) hours., Disp: 4 g, Rfl: 1   letrozole (FEMARA) 2.5 MG tablet, TAKE 1 TABLET BY MOUTH EVERY DAY, Disp: 90 tablet, Rfl: 3   metoprolol succinate (TOPROL-XL) 25 MG 24 hr tablet, Take 1 tablet (25 mg total) by mouth daily., Disp: 90 tablet, Rfl: 3   nitrofurantoin, macrocrystal-monohydrate, (MACROBID) 100 MG capsule, Take 1 capsule (100 mg total) by mouth daily., Disp: 30 capsule, Rfl: 1   ondansetron (ZOFRAN ODT) 8 MG disintegrating tablet, Take 1 tablet (8 mg total) by mouth every 8 (eight) hours as needed for nausea or vomiting., Disp: 20 tablet, Rfl: 1   umeclidinium bromide (INCRUSE ELLIPTA) 62.5 MCG/ACT AEPB, Inhale 1 puff into the lungs daily., Disp: 30 each, Rfl: 11   Subjective:   PATIENT ID: Emily Wilson GENDER: female DOB: [birth year], MRN: [id number]  Chief Complaint  Patient presents with   pulmonary consult    SOB with exertion, dry cough  and wheezing x72mohx of COPD.     HPI  Emily Wilson a pleasant 56year old female presenting to clinic for the evaluation of shortness of breath.  She reports having worsening shortness of breath for the past 3 months.  She is reporting exertional dyspnea associated with a cough that is dry and nonproductive. This is associated with intermittent wheezing. She was told she had COPD around 20 years ago at which point she decided to quit smoking. She tells me that she smoked a lot when she was very young and quit in 2013. She overall carries at least 60 to 90 pack years (tells me she smoked 3 packs a day). 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 She does not have any chest pain, chest tightness, fevers, chills, or weight loss. 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 In November, she was she was seen in urgent care for shortness of breath where she was given a course of antibiotics (azithromycin), prednisone, and inhalers. She was started on Advair in addition to as needed Combivent.  She is also maintained on daily nitrofurantoin for chronic UTIs.  Ancillary information including prior medications, full medical/surgical/family/social histories, and PFTs (when available) are listed below and have been reviewed.   Review of Systems  Constitutional:  Negative for chills, fever and weight loss.  Respiratory:  Positive for cough, shortness of breath and wheezing. Negative for sputum production.   Cardiovascular:  Negative for chest pain and palpitations.     Objective:   Vitals:   01/27/22 1133  BP: 136/80  Pulse: (!) 106  Temp: 97.7 F (36.5 C)  SpO2: 92%  Weight: 144 lb 3.2 oz (65.4 kg)  Height:  4' 9.5" (1.461 m)   92% on RA  BMI Readings from Last 3 Encounters:  01/27/22 30.66 kg/m  01/25/22 29.65 kg/m  01/13/22 29.89 kg/m   Wt Readings from Last 3 Encounters:  01/27/22 144 lb 3.2 oz (65.4 kg)  01/25/22 146 lb 12.8 oz (66.6 kg)  01/13/22 148 lb (67.1 kg)    Physical Exam Constitutional:      General: She is not in acute distress.    Appearance: Normal appearance. She is not ill-appearing.  HENT:     Nose: Nose normal.     Mouth/Throat:     Mouth: Mucous membranes are moist.  Eyes:     Extraocular Movements: Extraocular movements intact.  Cardiovascular:     Rate and Rhythm: Normal rate and regular rhythm.     Pulses: Normal pulses.     Heart sounds: Normal heart sounds.  Pulmonary:     Effort: Pulmonary effort is normal.     Breath sounds: Normal breath sounds. No wheezing.  Abdominal:     Palpations: Abdomen is soft.  Neurological:     General: No focal deficit present.     Mental Status: She is alert and oriented to person, place, and time.       Ancillary Information    Past Medical History:  Diagnosis Date   Anxiety state, unspecified    Arthritis    Breast cancer of lower-inner quadrant of right female breast [facility])    Carpal tunnel syndrome    Complication of anesthesia    woke up during ganglion cyst removal in her 20's, not woken up since   Diabetes mellitus without complication (HCC)    Type II   Diverticulosis    Heart murmur    History of radiation therapy 12/18/18- 01/30/19   Right Chest wall 25 fractions X 2Gy each to total 50 Gy, followed by a boost 10 Gy in 5 fractions.    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, without mention of thyrotoxic crisis or storm      Family History  Problem Relation Age of Onset   Diabetes Mother    Bladder Cancer Mother 36       smoker    Other Mother 45       TAH for unspecified reason   Multiple sclerosis Mother    Kidney failure Mother    Cancer Father 72  lymphatic/tonsil cancer - in remission; former smoker   Diabetes Sister    Diabetes Sister    Heart attack Brother    COPD Maternal Grandmother        smoker   Emphysema Maternal Grandmother        smoker   Diabetes Maternal Grandmother    Stroke Maternal Grandfather    Diabetes Paternal Grandmother    Dementia Maternal Uncle    COPD Maternal Uncle        smoker   Multiple sclerosis Paternal Aunt    Breast cancer Paternal Aunt        dx. late 29s - early 73s     Past Surgical History:  Procedure Laterality Date   ABDOMINAL HYSTERECTOMY     ANTERIOR CRUCIATE LIGAMENT REPAIR Right 2007   ACL repair   APPENDECTOMY     CARPAL TUNNEL RELEASE Right    GANGLION CYST EXCISION Right    TOTAL MASTECTOMY Right 11/16/2018   Procedure: RIGHT MASTECTOMY;  Surgeon: Coralie Keens, MD;  Location: [facility];  Service: General;  Laterality: Right;   TUBAL LIGATION      Social History   Socioeconomic History   Marital status: Divorced    Spouse name: Not on file   Number of children: 2   Years of education: Not on file   Highest education level: Not on file  Occupational History   Occupation: Disabled  Tobacco Use   Smoking status: Former    Packs/day: 3.00    Years: 30.00    Total pack years: 90.00    Types: Cigarettes    Quit date: 10/24/2011    Years since quitting: 10.2   Smokeless tobacco: Never  Vaping Use   Vaping Use: Never used  Substance and Sexual Activity   Alcohol use: No   Drug use: Never   Sexual activity: Not Currently  Other Topics Concern   Not on file  Social History Narrative   In [place] w/boyfriend   Works Chiropractor, Scientist, water quality   Social Determinants of Health   Financial Resource Strain: Medium Risk (05/01/2021)   Overall Financial Resource Strain (CARDIA)    Difficulty of Paying Living Expenses: Somewhat hard  Food  Insecurity: No Food Insecurity (05/01/2021)   Hunger Vital Sign    Worried About Running Out of Food in the Last Year: Never true    Ran Out of Food in the Last Year: Never true  Transportation Needs: No Transportation Needs (05/01/2021)   PRAPARE - Hydrologist (Medical): No    Lack of Transportation (Non-Medical): No  Physical Activity: Inactive (05/01/2021)   Exercise Vital Sign    Days of Exercise per Week: 0 days    Minutes of Exercise per Session: 0 min  Stress: Stress Concern Present (05/01/2021)   [place]    Feeling of Stress : Very much  Social Connections: Socially Isolated (05/01/2021)   Social Connection and Isolation Panel [NHANES]    Frequency of Communication with Friends and Family: More than three times a week    Frequency of Social Gatherings with Friends and Family: More than three times a week    Attends Religious Services: Never    Marine scientist or Organizations: No    Attends Archivist Meetings: Never    Marital Status: Divorced  Human resources officer Violence: Not At Risk (05/01/2021)   Humiliation, Afraid, Rape, and Kick questionnaire    Fear of  Current or Ex-Partner: No    Emotionally Abused: No    Physically Abused: No    Sexually Abused: No     Allergies  Allergen Reactions   Doxycycline Shortness Of Breath    Wheezing, shortness of breath, rash head to toe, and swelling   Naproxen Shortness Of Breath   Oxycodone-Acetaminophen Itching   Codeine Hives   Hydrocodone-Acetaminophen Hives   Ibuprofen     Irritant to stomach r/t diverticulitis   Lantus [Insulin Glargine]     Yeast Infections   Meloxicam Other (See Comments)    Abdominal pain    Propoxyphene N-Acetaminophen Hives   Sulfa Antibiotics Hives   Sulfonamide Derivatives Hives   Tramadol Other (See Comments)    Abdominal Pain     CBC    Component Value Date/Time   WBC 6.1  01/25/2022 1354   WBC 6.5 11/17/2021 0447   RBC 4.18 01/25/2022 1354   HGB 13.2 01/25/2022 1354   HGB 13.7 04/30/2021 1033   HCT 39.1 01/25/2022 1354   HCT 40.4 04/30/2021 1033   PLT 326 01/25/2022 1354   PLT 332 04/30/2021 1033   MCV 93.5 01/25/2022 1354   MCV 95 04/30/2021 1033   MCH 31.6 01/25/2022 1354   MCHC 33.8 01/25/2022 1354   RDW 13.0 01/25/2022 1354   RDW 12.1 04/30/2021 1033   LYMPHSABS 0.8 01/25/2022 1354   LYMPHSABS 0.6 (L) 04/30/2021 1033   MONOABS 1.0 01/25/2022 1354   EOSABS 0.2 01/25/2022 1354   EOSABS 0.2 04/30/2021 1033   BASOSABS 0.1 01/25/2022 1354   BASOSABS 0.0 04/30/2021 1033    Pulmonary Functions Testing Results:     No data to display          Outpatient Medications Prior to Visit  Medication Sig Dispense Refill   Accu-Chek FastClix Lancets MISC USE TO TEST BLOOD SUGAR UP TO FOUR TIMES DAILY AS DIRECTED 102 each 5   albuterol (VENTOLIN HFA) 108 (90 Base) MCG/ACT inhaler Inhale 1-2 puffs into the lungs every 6 (six) hours as needed for wheezing or shortness of breath. 8 g 1   alpelisib 200 mg daily dose (PIQRAY, 200 MG DAILY DOSE,) tablet Take 1 tablet (200 mg total) by mouth daily. Take with food daily. 28 tablet 0   BD INSULIN SYRINGE U/F 31G X 5/16" 0.5 ML MISC USE TO ADMINISTER INSULIN 3 TIMES DAILY WITH MEALS 100 each 3   cyclobenzaprine (FLEXERIL) 5 MG tablet Take 1 tablet (5 mg total) by mouth at bedtime. Prn muscle pain 30 tablet 1   denosumab (XGEVA) 120 MG/1.7ML SOLN injection Inject 120 mg into the skin every 3 (three) months.     diclofenac (VOLTAREN) 75 MG EC tablet Take 1 tablet (75 mg total) by mouth 2 (two) times daily as needed for mild pain or moderate pain. 60 tablet 3   fluticasone-salmeterol (ADVAIR DISKUS) 250-50 MCG/ACT AEPB Inhale 1 puff into the lungs in the morning and at bedtime. 60 each 2   gabapentin (NEURONTIN) 100 MG capsule Take 3 capsules (300 mg total) by mouth 3 (three) times daily. 90 capsule 1   glucose blood  (ACCU-CHEK GUIDE) test strip USE TO CHECK BLOOD SUGAR UP TO 4 TIMES A DAY (E11.65) 200 strip 2   HYDROcodone bit-homatropine (HYCODAN) 5-1.5 MG/5ML syrup Take 5 mLs by mouth every 4 (four) hours as needed for cough. 473 mL 0   insulin NPH Human (HUMULIN N) 100 UNIT/ML injection INJECT 12 UNITS INTO THE SKIN AT BEDTIME 10 mL 1  insulin regular (NOVOLIN R) 100 units/mL injection INJECT 12 UNITS TOTAL INTO THE SKIN 3X DAILY BEFORE MEALS. HOLD DOSE FOR BLOOD SUGAR LESS THAN 125 30 mL 3   Ipratropium-Albuterol (COMBIVENT RESPIMAT) 20-100 MCG/ACT AERS respimat Inhale 1 puff into the lungs every 6 (six) hours. 4 g 1   letrozole (FEMARA) 2.5 MG tablet TAKE 1 TABLET BY MOUTH EVERY DAY 90 tablet 3   metoprolol succinate (TOPROL-XL) 25 MG 24 hr tablet Take 1 tablet (25 mg total) by mouth daily. 90 tablet 3   nitrofurantoin, macrocrystal-monohydrate, (MACROBID) 100 MG capsule Take 1 capsule (100 mg total) by mouth daily. 30 capsule 1   ondansetron (ZOFRAN ODT) 8 MG disintegrating tablet Take 1 tablet (8 mg total) by mouth every 8 (eight) hours as needed for nausea or vomiting. 20 tablet 1   No facility-administered medications prior to visit.

## 2022-02-03 ENCOUNTER — Other Ambulatory Visit: Payer: Self-pay | Admitting: *Deleted

## 2022-02-03 ENCOUNTER — Other Ambulatory Visit: Payer: Self-pay | Admitting: Hematology and Oncology

## 2022-02-03 ENCOUNTER — Telehealth: Payer: Self-pay | Admitting: *Deleted

## 2022-02-03 DIAGNOSIS — C50311 Malignant neoplasm of lower-inner quadrant of right female breast: Secondary | ICD-10-CM

## 2022-02-03 MED ORDER — METHOCARBAMOL 750 MG PO TABS: 750.0000 mg | ORAL_TABLET | Freq: Three times a day (TID) | ORAL | 0 refills | Status: DC | PRN

## 2022-02-03 NOTE — Telephone Encounter (Signed)
Per MD request orders placed for CT soft tissue of the neck.

## 2022-02-03 NOTE — Progress Notes (Signed)
Sending a prescription for muscle relaxant because of intense neck muscle spasms.

## 2022-02-03 NOTE — Progress Notes (Signed)
Received call from pt with complaint of severe muscle spasms in her neck causing her head to hurt.  Pt states she is unable to fully move her head from side to side and is unable to safely drive to be seen by [facility].  Per MD CT soft tissue of the neck will need to be added on to CT CAP this Friday.  Orders placed.  MD also states he will send in for p.o muscle relaxer for pt to take to pharmacy on file.  Pt educated and verbalized understanding.

## 2022-02-05 ENCOUNTER — Ambulatory Visit (HOSPITAL_COMMUNITY)
Admission: RE | Admit: 2022-02-05 | Discharge: 2022-02-05 | Disposition: A | Discharge status: Home / Self Care | Payer: Medicare Other | Source: Ambulatory Visit | Attending: Hematology and Oncology | Admitting: Hematology and Oncology

## 2022-02-05 ENCOUNTER — Telehealth: Payer: Self-pay | Admitting: Hematology and Oncology

## 2022-02-05 DIAGNOSIS — C50311 Malignant neoplasm of lower-inner quadrant of right female breast: Secondary | ICD-10-CM

## 2022-02-05 DIAGNOSIS — Z170 Estrogen receptor positive status [ER+]: Secondary | ICD-10-CM | POA: Insufficient documentation

## 2022-02-05 MED ORDER — IOHEXOL 300 MG/ML  SOLN
100.0000 mL | Freq: Once | INTRAMUSCULAR | Status: AC | PRN
  Administered 2022-02-05: 100 mL via INTRAVENOUS

## 2022-02-05 MED ORDER — SODIUM CHLORIDE (PF) 0.9 % IJ SOLN
INTRAMUSCULAR | Status: AC
  Filled 2022-02-05: qty 50

## 2022-02-05 NOTE — Telephone Encounter (Signed)
Called Mrs. Bruening to discuss urgent results noted on CT scan.  Concerningly the patient has what appears to be a new pulmonary embolism as well as a C1 fracture.  Given these findings patient will need to report to emergency department for initiation of anticoagulation therapy as well as neurosurgical evaluation.  Patient will likely require a C-spine collar in order to help stabilize this fracture.  Called the patient's number twice and was sent to voicemail.  Left message requesting urgent call back.  Additionally called patient's emergency contact which also went directly to voicemail, no voicemail available for leaving message.  Will continue to attempt to contact patient to discuss urgent need to go to emergency department.  Ledell Peoples, MD Department of Hematology/Oncology [place] [facility] Phone: [phone] Pager: [phone] Email: [email or web address]

## 2022-02-06 ENCOUNTER — Emergency Department: Payer: Medicare Other

## 2022-02-06 ENCOUNTER — Other Ambulatory Visit: Payer: Self-pay

## 2022-02-06 ENCOUNTER — Observation Stay
Admission: EM | Admit: 2022-02-06 | Discharge: 2022-02-08 | Disposition: A | Discharge status: Home / Self Care | Payer: Medicare Other | Attending: Internal Medicine | Admitting: Internal Medicine

## 2022-02-06 ENCOUNTER — Telehealth: Payer: Self-pay | Admitting: Hematology and Oncology

## 2022-02-06 ENCOUNTER — Encounter (HOSPITAL_COMMUNITY): Payer: Self-pay

## 2022-02-06 DIAGNOSIS — C50311 Malignant neoplasm of lower-inner quadrant of right female breast: Secondary | ICD-10-CM | POA: Diagnosis not present

## 2022-02-06 DIAGNOSIS — R0609 Other forms of dyspnea: Secondary | ICD-10-CM | POA: Diagnosis not present

## 2022-02-06 DIAGNOSIS — Z79899 Other long term (current) drug therapy: Secondary | ICD-10-CM | POA: Diagnosis not present

## 2022-02-06 DIAGNOSIS — M4852XA Collapsed vertebra, not elsewhere classified, cervical region, initial encounter for fracture: Secondary | ICD-10-CM | POA: Diagnosis not present

## 2022-02-06 DIAGNOSIS — E119 Type 2 diabetes mellitus without complications: Secondary | ICD-10-CM | POA: Insufficient documentation

## 2022-02-06 DIAGNOSIS — I2694 Multiple subsegmental pulmonary emboli without acute cor pulmonale: Secondary | ICD-10-CM | POA: Diagnosis not present

## 2022-02-06 DIAGNOSIS — Z794 Long term (current) use of insulin: Secondary | ICD-10-CM | POA: Insufficient documentation

## 2022-02-06 DIAGNOSIS — C50919 Malignant neoplasm of unspecified site of unspecified female breast: Secondary | ICD-10-CM | POA: Diagnosis present

## 2022-02-06 DIAGNOSIS — M4850XA Collapsed vertebra, not elsewhere classified, site unspecified, initial encounter for fracture: Principal | ICD-10-CM

## 2022-02-06 DIAGNOSIS — C7951 Secondary malignant neoplasm of bone: Secondary | ICD-10-CM | POA: Diagnosis not present

## 2022-02-06 DIAGNOSIS — S129XXA Fracture of neck, unspecified, initial encounter: Secondary | ICD-10-CM | POA: Diagnosis present

## 2022-02-06 DIAGNOSIS — M542 Cervicalgia: Secondary | ICD-10-CM | POA: Diagnosis present

## 2022-02-06 DIAGNOSIS — E039 Hypothyroidism, unspecified: Secondary | ICD-10-CM | POA: Diagnosis not present

## 2022-02-06 DIAGNOSIS — Z87891 Personal history of nicotine dependence: Secondary | ICD-10-CM | POA: Diagnosis not present

## 2022-02-06 LAB — COMPREHENSIVE METABOLIC PANEL
ALT: 47 U/L — ABNORMAL HIGH (ref 0–44)
AST: 76 U/L — ABNORMAL HIGH (ref 15–41)
Albumin: 3.8 g/dL (ref 3.5–5.0)
Alkaline Phosphatase: 310 U/L — ABNORMAL HIGH (ref 38–126)
Anion gap: 11 (ref 5–15)
BUN: 15 mg/dL (ref 6–20)
CO2: 22 mmol/L (ref 22–32)
Calcium: 9.3 mg/dL (ref 8.9–10.3)
Chloride: 106 mmol/L (ref 98–111)
Creatinine, Ser: 0.77 mg/dL (ref 0.44–1.00)
GFR, Estimated: >60 mL/min (ref >60)
Glucose, Bld: 185 mg/dL — ABNORMAL HIGH (ref 70–99)
Potassium: 4.7 mmol/L (ref 3.5–5.1)
Sodium: 139 mmol/L (ref 135–145)
Total Bilirubin: 0.4 mg/dL (ref 0.3–1.2)
Total Protein: 8.3 g/dL — ABNORMAL HIGH (ref 6.5–8.1)

## 2022-02-06 LAB — CBC WITH DIFFERENTIAL/PLATELET
Abs Immature Granulocytes: 0.07 10*3/uL (ref 0.00–0.07)
Basophils Absolute: 0 10*3/uL (ref 0.0–0.1)
Basophils Relative: 1 %
Eosinophils Absolute: 0.3 10*3/uL (ref 0.0–0.5)
Eosinophils Relative: 5 %
HCT: 42.2 % (ref 36.0–46.0)
Hemoglobin: 13.6 g/dL (ref 12.0–15.0)
Immature Granulocytes: 1 %
Lymphocytes Relative: 12 %
Lymphs Abs: 0.7 10*3/uL (ref 0.7–4.0)
MCH: 30.4 pg (ref 26.0–34.0)
MCHC: 32.2 g/dL (ref 30.0–36.0)
MCV: 94.4 fL (ref 80.0–100.0)
Monocytes Absolute: 0.6 10*3/uL (ref 0.1–1.0)
Monocytes Relative: 11 %
Neutro Abs: 4 10*3/uL (ref 1.7–7.7)
Neutrophils Relative %: 70 %
Platelets: 406 10*3/uL — ABNORMAL HIGH (ref 150–400)
RBC: 4.47 MIL/uL (ref 3.87–5.11)
RDW: 13.9 % (ref 11.5–15.5)
WBC: 5.7 10*3/uL (ref 4.0–10.5)
nRBC: 0 % (ref 0.0–0.2)

## 2022-02-06 LAB — BRAIN NATRIURETIC PEPTIDE: B Natriuretic Peptide: 22.1 pg/mL (ref 0.0–100.0)

## 2022-02-06 LAB — D-DIMER, QUANTITATIVE: D-Dimer, Quant: 5.04 ug/mL-FEU — ABNORMAL HIGH (ref 0.00–0.50)

## 2022-02-06 LAB — APTT: aPTT: 32 seconds (ref 24–36)

## 2022-02-06 LAB — TROPONIN I (HIGH SENSITIVITY): Troponin I (High Sensitivity): 5 ng/L (ref <18)

## 2022-02-06 MED ORDER — HEPARIN (PORCINE) 25000 UT/250ML-% IV SOLN
1000.0000 [IU]/h | INTRAVENOUS | Status: DC
  Administered 2022-02-07: 1000 [IU]/h via INTRAVENOUS
  Administered 2022-02-07: 900 [IU]/h via INTRAVENOUS
  Filled 2022-02-06 (×2): qty 250

## 2022-02-06 MED ORDER — GADOBUTROL 1 MMOL/ML IV SOLN
7.0000 mL | Freq: Once | INTRAVENOUS | Status: AC | PRN
  Administered 2022-02-07: 7 mL via INTRAVENOUS

## 2022-02-06 MED ORDER — HEPARIN BOLUS VIA INFUSION
3800.0000 [IU] | Freq: Once | INTRAVENOUS | Status: AC
  Administered 2022-02-07: 3800 [IU] via INTRAVENOUS
  Filled 2022-02-06: qty 3800

## 2022-02-06 MED ORDER — ACETAMINOPHEN 500 MG PO TABS
1000.0000 mg | ORAL_TABLET | Freq: Once | ORAL | Status: AC
  Administered 2022-02-06: 1000 mg via ORAL
  Filled 2022-02-06: qty 2

## 2022-02-06 MED ORDER — IOHEXOL 350 MG/ML SOLN
75.0000 mL | Freq: Once | INTRAVENOUS | Status: AC | PRN
  Administered 2022-02-06: 75 mL via INTRAVENOUS

## 2022-02-06 MED ORDER — SODIUM CHLORIDE 0.9 % IV SOLN: Freq: Once | INTRAVENOUS | Status: AC

## 2022-02-06 NOTE — ED Triage Notes (Addendum)
Pt states she was sent by PCP for CT showing cervical spinal fracture and was told she needed to see a neurologist, and blood clot in lung. Pt has had cough and was treated for pneumonia. Pt is AOX4, c/o back and neck pain, NAD noted. Pt has breast cancer, pt not being treated with chemo or radiation currently. C-collar applied at this time.

## 2022-02-06 NOTE — Progress Notes (Signed)
ANTICOAGULATION CONSULT NOTE  Pharmacy Consult for heparin infusion Indication: pulmonary embolus  Allergies  Allergen Reactions   Doxycycline Shortness Of Breath    Wheezing, shortness of breath, rash head to toe, and swelling   Naproxen Shortness Of Breath   Oxycodone-Acetaminophen Itching   Codeine Hives   Hydrocodone-Acetaminophen Hives   Ibuprofen     Irritant to stomach r/t diverticulitis   Lantus [Insulin Glargine]     Yeast Infections   Meloxicam Other (See Comments)    Abdominal pain    Propoxyphene N-Acetaminophen Hives   Sulfa Antibiotics Hives   Sulfonamide Derivatives Hives   Tramadol Other (See Comments)    Abdominal Pain    Patient Measurements: Height: '4\' 9"'$  (144.8 cm) Weight: 65.3 kg (144 lb) IBW/kg (Calculated) : 38.6 Heparin Dosing Weight: 53.4 kg  Vital Signs: Temp: 98.5 F (36.9 C) (12/16 2001) Temp Source: Oral (12/16 2001) BP: 136/85 (12/16 2030) Pulse Rate: 93 (12/16 2030)  Labs: Recent Labs    02/06/22 1857 02/06/22 2044  HGB 13.6  --   HCT 42.2  --   PLT 406*  --   APTT 32  --   CREATININE 0.77  --   TROPONINIHS  --  5    Estimated Creatinine Clearance: 61.8 mL/min (by C-G formula based on SCr of 0.77 mg/dL).   Medical History: Past Medical History:  Diagnosis Date   Anxiety state, unspecified    Arthritis    Breast cancer of lower-inner quadrant of right female breast ([place])    Carpal tunnel syndrome    Complication of anesthesia    woke up during ganglion cyst removal in her 20's, not woken up since   Diabetes mellitus without complication (HCC)    Type II   Diverticulosis    Heart murmur    History of radiation therapy 12/18/18- 01/30/19   Right Chest wall 25 fractions X 2Gy each to total 50 Gy, followed by a boost 10 Gy in 5 fractions.    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  or other cause, without mention of thyrotoxic crisis or storm     Assessment: Pt is a 56 yo female presenting to ED from PCP due to PE found in lung, while treating pt for pneumonia.  Goal of Therapy:  Heparin level 0.3-0.7 units/ml Monitor platelets by anticoagulation protocol: Yes   Plan:  Bolus 3800 units x 1 Start heparin infusion at 900 units/hr Will check HL in 6 hr after start of infusion CBC daily while on heparin  Renda Rolls, PharmD, [facility] 02/06/2022 11:04 PM

## 2022-02-06 NOTE — ED Notes (Signed)
Called [facility] spoke w/Amy for possible transfer @ 8:51pm

## 2022-02-06 NOTE — ED Notes (Signed)
This RN was going to start heparin at this time, but patient is to be taken to MRI. Will start heparin infusion when patient returns from MRI.

## 2022-02-06 NOTE — ED Notes (Signed)
Called [facility] for possible transfer spoke w/ Sonia Baller '@7'$ :52pm

## 2022-02-06 NOTE — ED Provider Notes (Signed)
[facility] Provider Note    Event Date/Time   First MD Initiated Contact with Patient 02/06/22 1940     (approximate)   History   Neck Injury (Cervical fracture and blood clot in lung )   HPI  Emily Wilson is a 56 y.o. female  with pmhx metastatic breast CA, DM, HLD, hyperthyroidism here with cough, neck pain. Pt was sent to the ED today after outpt CT showed a PE as well as pathological neck fx. Pt has had ongoing, severe cough for "months" and has been diagnosed and treated for PNA twice. She was having an outpatient scan today and the provider had added on a ct of the neck as she ahs been having severe midline upper neck pain along with spasms of her paraspinal neck muscles. No UE weakness or numbness. No gait problems. She has had cough, chest pain w/ coughing. No hemoptysis. No SOB. No leg swelling.      Physical Exam   Triage Vital Signs: ED Triage Vitals  Enc Vitals Group     BP 02/06/22 1845 139/88     Pulse Rate 02/06/22 1845 (!) 104     Resp 02/06/22 1845 18     Temp 02/06/22 1845 98 F (36.7 C)     Temp Source 02/06/22 1845 Oral     SpO2 02/06/22 1845 96 %     Weight 02/06/22 1845 144 lb (65.3 kg)     Height 02/06/22 1845 '4\' 9"'$  (1.448 m)     Head Circumference --      Peak Flow --      Pain Score 02/06/22 1853 6     Pain Loc --      Pain Edu? --      Excl. in [place]? --     Most recent vital signs: Vitals:   02/06/22 2001 02/06/22 2030  BP: 138/80 136/85  Pulse: 93 93  Resp: 19 (!) 22  Temp: 98.5 F (36.9 C)   SpO2: 98% 97%     General: Awake, no distress.  CV:  Good peripheral perfusion. RRR. No murmurs. Resp:  Normal effort. Lungs CTAB.  Abd:  No distention. No tenderness. Other:  No LE edema. Moderate cervical TTP along upper cervical spine. Motor strength 5/5 in bilateral ue and le. Normal sensation to light touch bl UE and LE. 2+ reflexes b/l UE and LE.   ED Results / Procedures / Treatments   Labs (all labs  ordered are listed, but only abnormal results are displayed) Labs Reviewed  D-DIMER, QUANTITATIVE - Abnormal; Notable for the following components:      Result Value   D-Dimer, Quant 5.04 (*)    All other components within normal limits  CBC WITH DIFFERENTIAL/PLATELET - Abnormal; Notable for the following components:   Platelets 406 (*)    All other components within normal limits  COMPREHENSIVE METABOLIC PANEL - Abnormal; Notable for the following components:   Glucose, Bld 185 (*)    Total Protein 8.3 (*)    AST 76 (*)    ALT 47 (*)    Alkaline Phosphatase 310 (*)    All other components within normal limits  APTT  BRAIN NATRIURETIC PEPTIDE  TROPONIN I (HIGH SENSITIVITY)     EKG    RADIOLOGY CT Angio: nonocclusive PE LLL and LUL Diffuse metastatic disease    I also independently reviewed and agree with radiologist interpretations.   PROCEDURES:  Critical Care performed: Yes, see critical care procedure  note(s)  .Critical Care  Performed by: Duffy Bruce, MD Authorized by: Duffy Bruce, MD   Critical care provider statement:    Critical care time (minutes):  30   Critical care time was exclusive of:  Separately billable procedures and treating other patients   Critical care was necessary to treat or prevent imminent or life-threatening deterioration of the following conditions:  Cardiac failure, circulatory failure and respiratory failure   Critical care was time spent personally by me on the following activities:  Development of treatment plan with patient or surrogate, discussions with consultants, evaluation of patient's response to treatment, examination of patient, ordering and review of laboratory studies, ordering and review of radiographic studies, ordering and performing treatments and interventions, pulse oximetry, re-evaluation of patient's condition and review of old charts   I assumed direction of critical care for this patient from another provider  in my specialty: no     Care discussed with: admitting provider       MEDICATIONS ORDERED IN ED: Medications  heparin bolus via infusion 3,800 Units (has no administration in time range)  heparin ADULT infusion 100 units/mL (25000 units/251m) (has no administration in time range)  0.9 %  sodium chloride infusion ( Intravenous New Bag/Given 02/06/22 2057)  acetaminophen (TYLENOL) tablet 1,000 mg (1,000 mg Oral Given 02/06/22 2048)  iohexol (OMNIPAQUE) 350 MG/ML injection 75 mL (75 mLs Intravenous Contrast Given 02/06/22 2212)     IMPRESSION / [place]DM / [place]/ [place] COURSE  I reviewed the triage vital signs and the nursing notes.                              Differential diagnosis includes, but is not limited to, PE, pathological spine fx  Patient's presentation is most consistent with acute presentation with potential threat to life or bodily function.  The patient is on the cardiac monitor to evaluate for evidence of arrhythmia and/or significant heart rate changes.  56yo F with PMHx metastatic breast CA here with PE on outpt imaging and cervical spine fx. Re: PE - CT angio obtained today shows LUL and LLL PE. EKG nonischemic and trop neg, no signs of submassive PE. Will start heparin. Re: cervical spine fx, pt has intact UE and LE strength, sensation, reflexes. CMP shows mild transaminitis with normal renal function. CBC with no leukocytosis or anemia.  Spine fx: - Discussed with Dr. CJohnney Killian appreciate recommendations. He recommended discussing with [facility]. [facility] and pt should be admitted here. Discussed with Dr. CJohnney Killian Will admit here with C Spine with MRI and further reccs per his formal assessment.  PE: - Heparin gtt started. No signs of massive PE.      FINAL CLINICAL IMPRESSION(S) / ED DIAGNOSES   Final diagnoses:  Pathological compression fracture of  vertebra, [place] encounter ([place]  Multiple subsegmental pulmonary emboli without acute cor pulmonale ([place]     Rx / DC Orders   ED Discharge Orders     None        Note:  This document was prepared using Dragon voice recognition software and may include unintentional dictation errors.   IDuffy Bruce MD 02/06/22 [phone]

## 2022-02-06 NOTE — ED Notes (Signed)
Called [facility] and spoke w/Kim for possible transfer '@8'$ :45

## 2022-02-06 NOTE — ED Notes (Signed)
Emily Wilson called from [facility], they have declined to accept pt for transfer.

## 2022-02-06 NOTE — Telephone Encounter (Signed)
Call received from access nurse Pt called back regarding some urgent message Reviewed the message and discussed with the nurse that Dr Lorenso Courier recommends she go to the ED immediately for PE as well as neurosurgical eval for cervical fracture. Nurse will convey this message to the patient right away.  Emily Wilson

## 2022-02-07 ENCOUNTER — Encounter: Payer: Self-pay | Admitting: Internal Medicine

## 2022-02-07 ENCOUNTER — Other Ambulatory Visit: Payer: Self-pay

## 2022-02-07 ENCOUNTER — Inpatient Hospital Stay (HOSPITAL_BASED_OUTPATIENT_CLINIC_OR_DEPARTMENT_OTHER)
Admit: 2022-02-07 | Discharge: 2022-02-07 | Disposition: A | Discharge status: Home / Self Care | Payer: Medicare Other | Attending: Internal Medicine | Admitting: Internal Medicine

## 2022-02-07 DIAGNOSIS — C7951 Secondary malignant neoplasm of bone: Secondary | ICD-10-CM

## 2022-02-07 DIAGNOSIS — S12001A Unspecified nondisplaced fracture of first cervical vertebra, initial encounter for closed fracture: Secondary | ICD-10-CM

## 2022-02-07 DIAGNOSIS — C50919 Malignant neoplasm of unspecified site of unspecified female breast: Secondary | ICD-10-CM

## 2022-02-07 DIAGNOSIS — M4850XA Collapsed vertebra, not elsewhere classified, site unspecified, initial encounter for fracture: Secondary | ICD-10-CM

## 2022-02-07 DIAGNOSIS — M4852XA Collapsed vertebra, not elsewhere classified, cervical region, initial encounter for fracture: Secondary | ICD-10-CM | POA: Diagnosis not present

## 2022-02-07 DIAGNOSIS — R0609 Other forms of dyspnea: Secondary | ICD-10-CM | POA: Diagnosis not present

## 2022-02-07 DIAGNOSIS — I2694 Multiple subsegmental pulmonary emboli without acute cor pulmonale: Secondary | ICD-10-CM

## 2022-02-07 DIAGNOSIS — S129XXA Fracture of neck, unspecified, initial encounter: Secondary | ICD-10-CM | POA: Diagnosis present

## 2022-02-07 LAB — HEPARIN LEVEL (UNFRACTIONATED)
Heparin Unfractionated: 0.27 IU/mL — ABNORMAL LOW (ref 0.30–0.70)
Heparin Unfractionated: 0.32 IU/mL (ref 0.30–0.70)
Heparin Unfractionated: 0.3 IU/mL (ref 0.30–0.70)

## 2022-02-07 LAB — GLUCOSE, CAPILLARY: Glucose-Capillary: 133 mg/dL — ABNORMAL HIGH (ref 70–99)

## 2022-02-07 LAB — BASIC METABOLIC PANEL
Anion gap: 7 (ref 5–15)
BUN: 11 mg/dL (ref 6–20)
CO2: 17 mmol/L — ABNORMAL LOW (ref 22–32)
Calcium: 8.2 mg/dL — ABNORMAL LOW (ref 8.9–10.3)
Chloride: 113 mmol/L — ABNORMAL HIGH (ref 98–111)
Creatinine, Ser: 0.54 mg/dL (ref 0.44–1.00)
GFR, Estimated: >60 mL/min (ref >60)
Glucose, Bld: 162 mg/dL — ABNORMAL HIGH (ref 70–99)
Potassium: 4.2 mmol/L (ref 3.5–5.1)
Sodium: 137 mmol/L (ref 135–145)

## 2022-02-07 LAB — CBC
HCT: 37.4 % (ref 36.0–46.0)
Hemoglobin: 12.1 g/dL (ref 12.0–15.0)
MCH: 30.4 pg (ref 26.0–34.0)
MCHC: 32.4 g/dL (ref 30.0–36.0)
MCV: 94 fL (ref 80.0–100.0)
Platelets: 347 10*3/uL (ref 150–400)
RBC: 3.98 MIL/uL (ref 3.87–5.11)
RDW: 13.9 % (ref 11.5–15.5)
WBC: 5.3 10*3/uL (ref 4.0–10.5)
nRBC: 0 % (ref 0.0–0.2)

## 2022-02-07 LAB — PROTIME-INR
INR: 1.2 (ref 0.8–1.2)
Prothrombin Time: 15.2 seconds (ref 11.4–15.2)

## 2022-02-07 LAB — HIV ANTIBODY (ROUTINE TESTING W REFLEX): HIV Screen 4th Generation wRfx: NONREACTIVE

## 2022-02-07 LAB — ECHOCARDIOGRAM COMPLETE
AR max vel: 2.16 cm2
AV Peak grad: 8.3 mmHg
Ao pk vel: 1.44 m/s
Area-P 1/2: 6.6 cm2
Calc EF: 59.1 %
Height: 57 in
S' Lateral: 2.4 cm
Single Plane A2C EF: 60.7 %
Single Plane A4C EF: 57.8 %
Weight: 2304 oz

## 2022-02-07 LAB — CBG MONITORING, ED
Glucose-Capillary: 229 mg/dL — ABNORMAL HIGH (ref 70–99)
Glucose-Capillary: 187 mg/dL — ABNORMAL HIGH (ref 70–99)
Glucose-Capillary: 163 mg/dL — ABNORMAL HIGH (ref 70–99)

## 2022-02-07 MED ORDER — CYCLOBENZAPRINE HCL 10 MG PO TABS: 5.0000 mg | ORAL_TABLET | Freq: Every evening | ORAL | Status: DC | PRN

## 2022-02-07 MED ORDER — INSULIN DETEMIR 100 UNIT/ML ~~LOC~~ SOLN
10.0000 [IU] | Freq: Every day | SUBCUTANEOUS | Status: DC
  Administered 2022-02-07: 10 [IU] via SUBCUTANEOUS
  Filled 2022-02-07: qty 0.1

## 2022-02-07 MED ORDER — UMECLIDINIUM BROMIDE 62.5 MCG/ACT IN AEPB: 1.0000 | INHALATION_SPRAY | Freq: Every day | RESPIRATORY_TRACT | Status: DC

## 2022-02-07 MED ORDER — DICLOFENAC SODIUM 75 MG PO TBEC: 75.0000 mg | DELAYED_RELEASE_TABLET | Freq: Two times a day (BID) | ORAL | Status: DC | PRN

## 2022-02-07 MED ORDER — FUROSEMIDE 10 MG/ML IJ SOLN
20.0000 mg | Freq: Once | INTRAMUSCULAR | Status: AC
  Administered 2022-02-07: 20 mg via INTRAVENOUS
  Filled 2022-02-07: qty 4

## 2022-02-07 MED ORDER — ACETAMINOPHEN 325 MG PO TABS: 650.0000 mg | ORAL_TABLET | Freq: Four times a day (QID) | ORAL | Status: DC | PRN

## 2022-02-07 MED ORDER — INSULIN NPH (HUMAN) (ISOPHANE) 100 UNIT/ML ~~LOC~~ SUSP: 8.0000 [IU] | Freq: Every day | SUBCUTANEOUS | Status: DC

## 2022-02-07 MED ORDER — ONDANSETRON HCL 4 MG PO TABS: 4.0000 mg | ORAL_TABLET | Freq: Four times a day (QID) | ORAL | Status: DC | PRN

## 2022-02-07 MED ORDER — INSULIN ASPART 100 UNIT/ML IJ SOLN
0.0000 [IU] | Freq: Three times a day (TID) | INTRAMUSCULAR | Status: DC
  Administered 2022-02-07 (×2): 3 [IU] via SUBCUTANEOUS
  Administered 2022-02-07: 5 [IU] via SUBCUTANEOUS
  Administered 2022-02-08: 2 [IU] via SUBCUTANEOUS
  Filled 2022-02-07 (×4): qty 1

## 2022-02-07 MED ORDER — KETOROLAC TROMETHAMINE 15 MG/ML IJ SOLN
15.0000 mg | Freq: Four times a day (QID) | INTRAMUSCULAR | Status: DC | PRN
  Administered 2022-02-07 – 2022-02-08 (×2): 15 mg via INTRAVENOUS
  Filled 2022-02-07 (×2): qty 1

## 2022-02-07 MED ORDER — FLUTICASONE FUROATE-VILANTEROL 200-25 MCG/ACT IN AEPB: 1.0000 | INHALATION_SPRAY | Freq: Every day | RESPIRATORY_TRACT | Status: DC

## 2022-02-07 MED ORDER — GUAIFENESIN 100 MG/5ML PO LIQD: 5.0000 mL | ORAL | Status: DC | PRN

## 2022-02-07 MED ORDER — ALBUTEROL SULFATE HFA 108 (90 BASE) MCG/ACT IN AERS: 1.0000 | INHALATION_SPRAY | Freq: Four times a day (QID) | RESPIRATORY_TRACT | Status: DC | PRN

## 2022-02-07 MED ORDER — ALBUTEROL SULFATE (2.5 MG/3ML) 0.083% IN NEBU
2.5000 mg | INHALATION_SOLUTION | Freq: Four times a day (QID) | RESPIRATORY_TRACT | Status: DC | PRN
  Administered 2022-02-07: 2.5 mg via RESPIRATORY_TRACT
  Filled 2022-02-07: qty 3

## 2022-02-07 MED ORDER — LETROZOLE 2.5 MG PO TABS
2.5000 mg | ORAL_TABLET | Freq: Every day | ORAL | Status: DC
  Filled 2022-02-07: qty 1

## 2022-02-07 MED ORDER — SENNOSIDES-DOCUSATE SODIUM 8.6-50 MG PO TABS: 1.0000 | ORAL_TABLET | Freq: Every evening | ORAL | Status: DC | PRN

## 2022-02-07 MED ORDER — GABAPENTIN 300 MG PO CAPS: 300.0000 mg | ORAL_CAPSULE | Freq: Three times a day (TID) | ORAL | Status: DC

## 2022-02-07 MED ORDER — HEPARIN BOLUS VIA INFUSION
800.0000 [IU] | Freq: Once | INTRAVENOUS | Status: AC
  Administered 2022-02-07: 800 [IU] via INTRAVENOUS
  Filled 2022-02-07: qty 800

## 2022-02-07 MED ORDER — ONDANSETRON HCL 4 MG/2ML IJ SOLN: 4.0000 mg | Freq: Four times a day (QID) | INTRAMUSCULAR | Status: DC | PRN

## 2022-02-07 MED ORDER — METOPROLOL SUCCINATE ER 25 MG PO TB24
25.0000 mg | ORAL_TABLET | Freq: Every day | ORAL | Status: DC
  Administered 2022-02-07 – 2022-02-08 (×2): 25 mg via ORAL
  Filled 2022-02-07 (×2): qty 1

## 2022-02-07 MED ORDER — INSULIN ASPART 100 UNIT/ML IJ SOLN
4.0000 [IU] | Freq: Three times a day (TID) | INTRAMUSCULAR | Status: DC
  Administered 2022-02-07 – 2022-02-08 (×3): 4 [IU] via SUBCUTANEOUS
  Filled 2022-02-07 (×3): qty 1

## 2022-02-07 NOTE — Progress Notes (Signed)
  Echocardiogram 2D Echocardiogram has been performed.  Claretta Fraise 02/07/2022, 9:28 AM

## 2022-02-07 NOTE — Progress Notes (Signed)
ANTICOAGULATION CONSULT NOTE  Pharmacy Consult for heparin infusion Indication: pulmonary embolus  Allergies  Allergen Reactions   Doxycycline Shortness Of Breath    Wheezing, shortness of breath, rash head to toe, and swelling   Naproxen Shortness Of Breath   Oxycodone-Acetaminophen Itching   Codeine Hives   Hydrocodone-Acetaminophen Hives   Ibuprofen     Irritant to stomach r/t diverticulitis   Lantus [Insulin Glargine]     Yeast Infections   Meloxicam Other (See Comments)    Abdominal pain    Propoxyphene N-Acetaminophen Hives   Sulfa Antibiotics Hives   Sulfonamide Derivatives Hives   Tramadol Other (See Comments)    Abdominal Pain    Patient Measurements: Height: '4\' 9"'$  (144.8 cm) Weight: 65.3 kg (144 lb) IBW/kg (Calculated) : 38.6 Heparin Dosing Weight: 53.4 kg  Vital Signs: Temp: 98 F (36.7 C) (12/17 1447) Temp Source: Oral (12/17 1447) BP: 131/97 (12/17 1430) Pulse Rate: 94 (12/17 1430)  Labs: Recent Labs    02/06/22 1857 02/06/22 2044 02/07/22 0539 02/07/22 0722 02/07/22 1432  HGB 13.6  --  12.1  --   --   HCT 42.2  --  37.4  --   --   PLT 406*  --  347  --   --   APTT 32  --   --   --   --   LABPROT  --   --  15.2  --   --   INR  --   --  1.2  --   --   HEPARINUNFRC  --   --  0.27*  --  0.32  CREATININE 0.77  --   --  0.54  --   TROPONINIHS  --  5  --   --   --      Estimated Creatinine Clearance: 61.8 mL/min (by C-G formula based on SCr of 0.54 mg/dL).   Medical History: Past Medical History:  Diagnosis Date   Anxiety state, unspecified    Arthritis    Breast cancer of lower-inner quadrant of right female breast ([place])    Carpal tunnel syndrome    Complication of anesthesia    woke up during ganglion cyst removal in her 20's, not woken up since   Diabetes mellitus without complication (HCC)    Type II   Diverticulosis    Heart murmur    History of radiation therapy 12/18/18- 01/30/19   Right Chest wall 25 fractions X 2Gy each to  total 50 Gy, followed by a boost 10 Gy in 5 fractions.    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 nonocclusive PE seen on CT study, provoked given malignancy   Baseline INR 1.2, aPTT 32s  Goal of Therapy:  Heparin level 0.3-0.7 units/ml Monitor platelets by anticoagulation protocol: Yes  Plan: heparin level therapeutic ---continue heparin infusion at 1000 units/hr ---Will recheck heparin level in 6 hours to confirm ---CBC daily while on heparin  Vallery Sa, PharmD, BCPS 02/07/2022 2:59 PM

## 2022-02-07 NOTE — Progress Notes (Signed)
PT Cancellation Note  Patient Details Name: Emily Wilson MRN: [id number] DOB: [birth year]   Cancelled Treatment:    Reason Eval/Treat Not Completed: Medical issues which prohibited therapy. Patient is not medically ready for PT eval. PE, neurosurgery consult, MRI pending. Will evaluate when appropriate.     Findley Blankenbaker 02/07/2022, 9:02 AM

## 2022-02-07 NOTE — Progress Notes (Signed)
[facility] NAME: Emily Wilson    MR#:  [id number]  DATE OF BIRTH:  01-14-1966  SUBJECTIVE:   Patient came in after she started having neck pain and stiffness for last couple days with spells of coughing. She has history of emphysema. She had outpatient CT neck which showed C1 C2 ring fracture patient was asked to come to the emergency room. Further evaluation noted she has PE as well. Denies any chest pain. Breathing comfortably. Sats 96 to 97% room evaluation. Blood pressure stable. No family at bedside.   VITALS:  Blood pressure 132/81, pulse 100, temperature 97.9 F (36.6 C), temperature source Oral, resp. rate 18, height '4\' 9"'$  (1.448 m), weight 65.3 kg, SpO2 97 %.  PHYSICAL EXAMINATION:   GENERAL:  56 y.o.-year-old patient lying in the bed with no acute distress. Hard neck collar+ LUNGS: Normal breath sounds bilaterally, no wheezing CARDIOVASCULAR: S1, S2 normal. No murmur   ABDOMEN: Soft, nontender, nondistended. Bowel sounds present.  EXTREMITIES: No  edema b/l.    NEUROLOGIC: nonfocal  patient is alert and awake SKIN: No obvious rash, lesion, or ulcer.   LABORATORY PANEL:  CBC Recent Labs  Lab 02/07/22 0539  WBC 5.3  HGB 12.1  HCT 37.4  PLT 347    Chemistries  Recent Labs  Lab 02/06/22 1857 02/07/22 0722  NA 139 137  K 4.7 4.2  CL 106 113*  CO2 22 17*  GLUCOSE 185* 162*  BUN 15 11  CREATININE 0.77 0.54  CALCIUM 9.3 8.2*  AST 76*  --   ALT 47*  --   ALKPHOS 310*  --   BILITOT 0.4  --    Cardiac Enzymes No results for input(s): "TROPONINI" in the last 168 hours. RADIOLOGY:  ECHOCARDIOGRAM COMPLETE  Result Date: 02/07/2022    ECHOCARDIOGRAM REPORT   Patient Name:   Emily Wilson Date of Exam: 02/07/2022 Medical Rec #:  [id number]        Height:       57.0 in Accession #:    [id number]       Weight:       144.0 lb Date of Birth:  May 03, 1965       BSA:          1.564 m Patient Age:    56 years          BP:           137/90 mmHg Patient Gender: F                HR:           99 bpm. Exam Location:  [facility] Procedure: 2D Echo Indications:     Dyspnea R06.00  History:         Patient has no prior history of Echocardiogram examinations.  Sonographer:     Kathlen Brunswick RDCS Referring Phys:  [id number] CRYSTAL AZU Diagnosing Phys: Ida Rogue MD  Sonographer Comments: This study was performed with the patient supine, unable to turn due to neck collar placed. IMPRESSIONS  1. Left ventricular ejection fraction, by estimation, is 60 to 65%. The left ventricle has normal function. The left ventricle has no regional wall motion abnormalities. Left ventricular diastolic parameters are consistent with Grade I diastolic dysfunction (impaired relaxation).  2. Right ventricular systolic function is normal. The right ventricular size is normal. Tricuspid regurgitation signal is inadequate for assessing PA pressure.  3. The mitral valve is normal  in structure. No evidence of mitral valve regurgitation. No evidence of mitral stenosis.  4. The aortic valve was not well visualized. Aortic valve regurgitation is not visualized. No aortic stenosis is present.  5. The inferior vena cava is normal in size with greater than 50% respiratory variability, suggesting right atrial pressure of 3 mmHg. FINDINGS  Left Ventricle: Left ventricular ejection fraction, by estimation, is 60 to 65%. The left ventricle has normal function. The left ventricle has no regional wall motion abnormalities. The left ventricular internal cavity size was normal in size. There is  no left ventricular hypertrophy. Left ventricular diastolic parameters are consistent with Grade I diastolic dysfunction (impaired relaxation). Right Ventricle: The right ventricular size is normal. No increase in right ventricular wall thickness. Right ventricular systolic function is normal. Tricuspid regurgitation signal is inadequate for assessing PA pressure. Left Atrium: Left  atrial size was normal in size. Right Atrium: Right atrial size was normal in size. Pericardium: There is no evidence of pericardial effusion. Mitral Valve: The mitral valve is normal in structure. No evidence of mitral valve regurgitation. No evidence of mitral valve stenosis. Tricuspid Valve: The tricuspid valve is normal in structure. Tricuspid valve regurgitation is not demonstrated. No evidence of tricuspid stenosis. Aortic Valve: The aortic valve was not well visualized. Aortic valve regurgitation is not visualized. No aortic stenosis is present. Aortic valve peak gradient measures 8.3 mmHg. Pulmonic Valve: The pulmonic valve was normal in structure. Pulmonic valve regurgitation is not visualized. No evidence of pulmonic stenosis. Aorta: The aortic root is normal in size and structure. Venous: The inferior vena cava is normal in size with greater than 50% respiratory variability, suggesting right atrial pressure of 3 mmHg. IAS/Shunts: No atrial level shunt detected by color flow Doppler.  LEFT VENTRICLE PLAX 2D LVIDd:         3.40 cm     Diastology LVIDs:         2.40 cm     LV e' medial:    7.72 cm/s LV PW:         0.90 cm     LV E/e' medial:  8.4 LV IVS:        0.90 cm     LV e' lateral:   11.40 cm/s LVOT diam:     1.80 cm     LV E/e' lateral: 5.7 LV SV:         49 LV SV Index:   31 LVOT Area:     2.54 cm  LV Volumes (MOD) LV vol d, MOD A2C: 45.0 ml LV vol d, MOD A4C: 32.9 ml LV vol s, MOD A2C: 17.7 ml LV vol s, MOD A4C: 13.9 ml LV SV MOD A2C:     27.3 ml LV SV MOD A4C:     32.9 ml LV SV MOD BP:      23.4 ml RIGHT VENTRICLE RV Basal diam:  2.50 cm RV S prime:     19.70 cm/s TAPSE (M-mode): 2.1 cm LEFT ATRIUM             Index        RIGHT ATRIUM          Index LA diam:        2.60 cm 1.66 cm/m   RA Area:     6.43 cm LA Vol (A2C):   18.2 ml 11.63 ml/m  RA Volume:   9.02 ml  5.77 ml/m LA Vol (A4C):   13.9 ml 8.89 ml/m LA  Biplane Vol: 17.0 ml 10.87 ml/m  AORTIC VALVE                 PULMONIC VALVE AV  Area (Vmax): 2.16 cm     PV Vmax:       1.00 m/s AV Vmax:        144.00 cm/s  PV Peak grad:  4.0 mmHg AV Peak Grad:   8.3 mmHg LVOT Vmax:      122.00 cm/s LVOT Vmean:     79.400 cm/s LVOT VTI:       0.191 m  AORTA Ao Root diam: 2.70 cm MITRAL VALVE MV Area (PHT): 6.60 cm    SHUNTS MV Decel Time: 115 msec    Systemic VTI:  0.19 m MV E velocity: 64.50 cm/s  Systemic Diam: 1.80 cm MV A velocity: 87.50 cm/s MV E/A ratio:  0.74 Ida Rogue MD Electronically signed by Ida Rogue MD Signature Date/Time: 02/07/2022/11:36:22 AM    Final    MR Brain W and Wo Contrast  Result Date: 02/07/2022 CLINICAL DATA:  Neck pain with known malignancy EXAM: MRI HEAD WITHOUT AND WITH CONTRAST MRI CERVICAL SPINE WITHOUT AND WITH CONTRAST TECHNIQUE: Multiplanar, multiecho pulse sequences of the brain and surrounding structures, and cervical spine, to include the craniocervical junction and cervicothoracic junction, were obtained without and with intravenous contrast. CONTRAST:  18m GADAVIST GADOBUTROL 1 MMOL/ML IV SOLN COMPARISON:  None Available. FINDINGS: MRI HEAD FINDINGS Brain: No acute infarct, mass effect or extra-axial collection. No acute or chronic hemorrhage. Normal white matter signal, parenchymal volume and CSF spaces. The midline structures are normal. There is no abnormal contrast enhancement. Vascular: Major flow voids are preserved. Skull spine: There are numerous contrast-enhancing metastases throughout the calvarium. The largest is at the posterior midline. Sinuses/Orbits:No paranasal sinus fluid levels or advanced mucosal thickening. No mastoid or middle ear effusion. Normal orbits. MRI CERVICAL SPINE FINDINGS Alignment: Physiologic. Vertebrae: Pathologic fracture of the anterior C1 ring better characterized on earlier CT. There is no cervical spine compression fracture. There is diffuse osseous metastatic disease throughout the cervical spine. No epidural lesion. Cord: Normal signal and morphology. Posterior  Fossa, vertebral arteries, paraspinal tissues: Negative. Disc levels: C1-2: Unremarkable. C2-3: Normal disc space and facet joints. There is no spinal canal stenosis. No neural foraminal stenosis. C3-4: Normal disc space and facet joints. There is no spinal canal stenosis. No neural foraminal stenosis. C4-5: Normal disc space and facet joints. There is no spinal canal stenosis. No neural foraminal stenosis. C5-6: Small disc bulge with right-greater-than-left uncovertebral spurring. There is no spinal canal stenosis. No neural foraminal stenosis. C6-7: Normal disc space and facet joints. There is no spinal canal stenosis. No neural foraminal stenosis. C7-T1: Normal disc space and facet joints. There is no spinal canal stenosis. No neural foraminal stenosis. IMPRESSION: 1. 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 2. Pathologic fracture of the anterior C1 ring better characterized on earlier CT. 3. No intracranial metastatic disease. 4. No spinal canal or neural foraminal stenosis. Electronically Signed   By: KUlyses JarredM.D.   On: 02/07/2022 00:58   MR Cervical Spine W or Wo Contrast  Result Date: 02/07/2022 CLINICAL DATA:  Neck pain with known malignancy EXAM: MRI HEAD WITHOUT AND WITH CONTRAST MRI CERVICAL SPINE WITHOUT AND WITH CONTRAST TECHNIQUE: Multiplanar, multiecho pulse sequences of the brain and surrounding structures, and cervical spine, to include the craniocervical junction and cervicothoracic junction, were obtained without and with intravenous contrast. CONTRAST:  768m  GADAVIST GADOBUTROL 1 MMOL/ML IV SOLN COMPARISON:  None Available. FINDINGS: MRI HEAD FINDINGS Brain: No acute infarct, mass effect or extra-axial collection. No acute or chronic hemorrhage. Normal white matter signal, parenchymal volume and CSF spaces. The midline structures are normal. There is no abnormal contrast enhancement. Vascular: Major flow voids are preserved. Skull spine: There are  numerous contrast-enhancing metastases throughout the calvarium. The largest is at the posterior midline. Sinuses/Orbits:No paranasal sinus fluid levels or advanced mucosal thickening. No mastoid or middle ear effusion. Normal orbits. MRI CERVICAL SPINE FINDINGS Alignment: Physiologic. Vertebrae: Pathologic fracture of the anterior C1 ring better characterized on earlier CT. There is no cervical spine compression fracture. There is diffuse osseous metastatic disease throughout the cervical spine. No epidural lesion. Cord: Normal signal and morphology. Posterior Fossa, vertebral arteries, paraspinal tissues: Negative. Disc levels: C1-2: Unremarkable. C2-3: Normal disc space and facet joints. There is no spinal canal stenosis. No neural foraminal stenosis. C3-4: Normal disc space and facet joints. There is no spinal canal stenosis. No neural foraminal stenosis. C4-5: Normal disc space and facet joints. There is no spinal canal stenosis. No neural foraminal stenosis. C5-6: Small disc bulge with right-greater-than-left uncovertebral spurring. There is no spinal canal stenosis. No neural foraminal stenosis. C6-7: Normal disc space and facet joints. There is no spinal canal stenosis. No neural foraminal stenosis. C7-T1: Normal disc space and facet joints. There is no spinal canal stenosis. No neural foraminal stenosis. IMPRESSION: 1. 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 2. Pathologic fracture of the anterior C1 ring better characterized on earlier CT. 3. No intracranial metastatic disease. 4. No spinal canal or neural foraminal stenosis. Electronically Signed   By: Ulyses Jarred M.D.   On: 02/07/2022 00:58   CT Angio Chest PE W and/or Wo Contrast  Result Date: 02/06/2022 CLINICAL DATA:  High probability for PE.  Cough. EXAM: CT ANGIOGRAPHY CHEST WITH CONTRAST TECHNIQUE: Multidetector CT imaging of the chest was performed using the standard protocol during bolus  administration of intravenous contrast. Multiplanar CT image reconstructions and MIPs were obtained to evaluate the vascular anatomy. RADIATION DOSE REDUCTION: This exam was performed according to the departmental dose-optimization program which includes automated exposure control, adjustment of the mA and/or kV according to patient size and/or use of iterative reconstruction technique. CONTRAST:  61m OMNIPAQUE IOHEXOL 350 MG/ML SOLN COMPARISON:  CT chest abdomen and pelvis 02/05/2022 FINDINGS: Cardiovascular: There is adequate opacification of the pulmonary arteries. There are nonocclusive pulmonary emboli within the left lower lobe pulmonary artery extending into segmental branches. There also segmental nonocclusive pulmonary emboli in the left upper lobe. Main pulmonary artery is normal in size. Heart and aorta are normal in size. There is no pericardial effusion. Mediastinum/Nodes: No discrete enlarged lymph nodes are identified, unchanged. Visualized thyroid gland and esophagus are within normal limits. Lungs/Pleura: Small bilateral pleural effusions are similar to prior. Mild emphysematous changes are again seen. There is peripheral smooth interlobular septal thickening favored as mild edema. There is some nodular thickening of the bilateral major fissures and along the pleural surfaces inferiorly. Scattered micro nodules measuring 3 mm or less are seen in the left upper lobe and left lower lobe. There is no evidence for pneumothorax. Upper Abdomen: Multiple liver lesions are unchanged, likely metastatic disease. Musculoskeletal: Right mastectomy changes again noted. Diffuse osseous metastatic disease again noted. Multiple pathologic fractures involving the ribs bilaterally appear unchanged from prior. L1 compression deformity is unchanged from prior. Review of the MIP images confirms the above  findings. IMPRESSION: 1. Nonocclusive pulmonary emboli involving the left lower lobe and left upper lobe. No  evidence for right heart strain. 2. Stable small bilateral pleural effusions. 3. Mild interstitial edema. 4. Nodular thickening of the pleural surfaces worrisome for metastatic disease. 5. Stable diffuse osseous metastatic disease with pathologic fractures of the ribs bilaterally. Electronically Signed   By: Ronney Asters M.D.   On: 02/06/2022 22:40   CT CHEST ABDOMEN PELVIS W CONTRAST  Addendum Date: 02/05/2022   ADDENDUM REPORT: 02/05/2022 17:17 ADDENDUM: The original report was by Dr. Van Clines. The following addendum is by Dr. Van Clines: Critical Value/emergent results were called by telephone at the time of interpretation on 02/05/2022 at 5:05 pm to provider Dr. Loma Messing, Who verbally acknowledged these results. In addition to discussing the left-sided pulmonary embolus, numerous new bilateral rib fractures, and other substantial findings of progressive metastatic disease, we also discussed the appearance suspicious for pathologic fracture of the ring of C1 and possible pathologic fracture of the C2 vertebral body shown on the CT of the neck. Electronically Signed   By: Van Clines M.D.   On: 02/05/2022 17:17   Result Date: 02/05/2022 CLINICAL DATA:  Breast cancer restaging * Tracking Code: BO * EXAM: CT CHEST, ABDOMEN, AND PELVIS WITH CONTRAST TECHNIQUE: Multidetector CT imaging of the chest, abdomen and pelvis was performed following the standard protocol during bolus administration of intravenous contrast. RADIATION DOSE REDUCTION: This exam was performed according to the departmental dose-optimization program which includes automated exposure control, adjustment of the mA and/or kV according to patient size and/or use of iterative reconstruction technique. CONTRAST:  130m OMNIPAQUE IOHEXOL 300 MG/ML  SOLN COMPARISON:  09/30/2021 FINDINGS: CT CHEST FINDINGS Cardiovascular: Primarily peripheral but potentially some central filling defect in the left upper lobe and left lower  lobe pulmonary arteries compatible with chronic pulmonary embolus potentially with a component of acute pulmonary embolus. Today's exam was not optimized to detect or characterized pulmonary embolus. Coronary, aortic arch, and branch vessel atherosclerotic vascular disease. Mediastinum/Nodes: Similar to previous there is some edema around the lower trachea, coronal, and left mainstem bronchus potentially from radiation therapy or previously treated disease. No overt pathologic thoracic adenopathy currently identified. There is likely a small type 1 hiatal hernia. Lungs/Pleura: Small bilateral pleural effusions. Emphysema. Bilateral interstitial accentuation increased from previous. 4 mm left upper lobe subpleural nodule on image 70 series 8, previously 3 mm in diameter. Pleural-based nodularity along the fissures is substantially increased from prior and somewhat diffuse along the fissures. Musculoskeletal: Progressive and worsening primarily lytic but some sclerotic lesions along the ribs, bilateral scapula, right proximal humerus, sternum, and thoracic spine. Numerous fractures of the ribs, many of which although not all of which are pathologic, including the right second, fifth, sixth, seventh, eighth, ninth, tenth, eleventh, and twelfth ribs; and the left third, fourth, fifth, seventh, ninth, and tenth ribs. Many of these rib fractures are new compared to previous. Multilevel metastatic involvement in the thoracic spine mostly similar to prior with some evolutionary findings including increased lytic posterior element involvement at the T10 vertebral level. CT ABDOMEN PELVIS FINDINGS Hepatobiliary: Substantially increased metastatic involvement of the liver with current involvement of all segments. A new medial segment left hepatic lobe mass measures 7.1 by 6.8 cm on image 48 series 3. A previous lateral segment left hepatic lobe mass measures 4.1 by 2.5 cm on image 57 series 3, formerly 1.6 cm in diameter. No  current biliary dilatation. Pancreas: Dorsal pancreatic duct dilatation  without a well-defined pancreatic mass. Spleen: Unremarkable Adrenals/Urinary Tract: Left adrenal mass 1.6 by 2.0 cm, nonspecific enhancement pattern. The kidneys and urinary bladder appear unremarkable. Stomach/Bowel: Sigmoid colon diverticulosis. Vascular/Lymphatic: Atherosclerosis is present, including aortoiliac atherosclerotic disease. Reproductive: Uterus absent.  Adnexa unremarkable. Other: No supplemental non-categorized findings. Musculoskeletal: Progressive metastatic disease with increased moth-eaten appearance of the iliac crests compared to prior new or increased right femoral neck metastatic lesion. Increased lysis of the left ischial tuberosity. Multiple lumbar spine metastatic lesions are identified. New 40% anterior compression fracture at L1 is likely pathologic and is associated with 2-3 mm of posterior bony retropulsion. IMPRESSION: 1. Substantial and extensive progression of metastatic disease to the liver and skeleton. 2. 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 3. Small bilateral pleural effusions with pleural-based nodularity along the fissures, suspicious for pleural metastatic disease. 4. New 40% anterior compression fracture at L1, likely pathologic, with 2-3 mm of posterior bony retropulsion. 5. Numerous new bilateral rib fractures, most of which are pathologic 6. Nonspecific 2 cm left adrenal mass, possibly metastatic. 7. Other imaging findings of potential clinical significance: Coronary, aortic arch, and branch vessel atherosclerotic disease. Sigmoid colon diverticulosis. 8. Dorsal pancreatic duct dilatation without a well-defined pancreatic mass. Aortic Atherosclerosis (ICD10-I70.0) and Emphysema (ICD10-J43.9). Radiology assistant personnel have been notified to put me in telephone contact with the referring  physician or the referring physician's clinical representative in order to discuss these findings. Once this communication is established I will issue an addendum to this report for documentation purposes. Electronically Signed: By: Van Clines M.D. On: 02/05/2022 16:35   CT Soft Tissue Neck W Contrast  Addendum Date: 02/05/2022   ADDENDUM REPORT: 02/05/2022 16:34 ADDENDUM: On further review, there is a suspected pathologic fracture through the anterior C1 ring (5-48) and left posterior aspect of the C1 ring (5-46). There is a possible pathologic fracture through the C2 vertebral body (5-53). This addendum will be called to the ordering provider by Dr. Janeece Fitting. Electronically Signed   By: Valetta Mole M.D.   On: 02/05/2022 16:34   Result Date: 02/05/2022 CLINICAL DATA:  Metastatic breast cancer. EXAM: CT NECK WITH CONTRAST TECHNIQUE: Multidetector CT imaging of the neck was performed using the standard protocol following the bolus administration of intravenous contrast. RADIATION DOSE REDUCTION: This exam was performed according to the departmental dose-optimization program which includes automated exposure control, adjustment of the mA and/or kV according to patient size and/or use of iterative reconstruction technique. CONTRAST:  160m OMNIPAQUE IOHEXOL 300 MG/ML  SOLN COMPARISON:  No direct comparison study available. FINDINGS: Pharynx and larynx: The nasal cavity and nasopharynx are unremarkable. An ovoid area of hypodensity in the region of the right palatine tonsil/glossotonsillar sulcus may reflect a retention cyst. The oral cavity and oropharynx are otherwise unremarkable. The parapharyngeal spaces are clear. The hypopharynx and larynx are unremarkable. The vocal folds are normal in appearance. There is no retropharyngeal fluid collection.  The airway is patent. Salivary glands: The parotid and submandibular glands are unremarkable. Thyroid: Unremarkable. Lymph nodes: There is no pathologic  lymphadenopathy in the neck. Vascular: The major vasculature of the neck is unremarkable. Limited intracranial: The imaged portions of the intracranial compartment are unremarkable. Visualized orbits: The globes and orbits are unremarkable. Mastoids and visualized paranasal sinuses: There is mild mucosal thickening in the left maxillary sinus. The mastoid air cells and middle ear cavities are clear. Skeleton: There is extensive osseous  metastatic disease. There is an expansile lesion in the left frontal calvarium with breakthrough of the inner and outer tables. There is no definite soft tissue component extending into the intracranial compartment. Additional permeative lytic metastatic disease is seen in the occipital bone at the midline. There is a metastatic lesion in the left temporal bone (5-31). The adjacent sigmoid sinus is patent. There is metastatic disease in the C1 ring involving the right lateral mass and left aspect of the posterior arch. There is extensive metastatic involvement in the C2 vertebral body extending to the pedicle and left lamina and spinous process. There is metastatic disease in the C5 and T1 spinous processes, T2 vertebral body and spinous process, and T3 posterior elements. There is a probable small metastatic lesion in the sternal manubrium. There is possible metastatic disease in the right mandibular condyle (8-24, 5-36). There is metastatic disease in the right scapula (5-98, 8-22). There are metastatic lesions in the bilateral second ribs. Upper chest: Assessed on the separately dictated CT chest. Other: None. IMPRESSION: 1. No pathologic lymphadenopathy or suspicious soft tissue mass in the neck. 2. Extensive osseous metastatic disease in the calvarium, cervical spine, possibly right mandibular condyle, sternal manubrium, and right scapula. Electronically Signed: By: Valetta Mole M.D. On: 02/05/2022 16:19    Assessment and Plan KRYSTALYN KUBOTA is a 56 y.o. female with medical  history significant of metastatic breast cancer, insulin-dependent diabetes, hypothyroidism who presents for further review and evaluation of neck pain.  Patient reports that she has had progressive exertional dyspnea week she has had progressive progression of her symptoms with associated cough over the past 3 to 4 months.  She had a significant bout of cough that was followed by severe left central back pain that she has never had before.     CT of her neck for further workup of her cancer and was then found to have incidental C1 and C2 fracture. MRI cervical spine confirms it.  Cervical fracture, pathological Metastatic Breast cancer - C1/C2 with likely fracture - Neurosurgery consulted. Dr Samson Frederic recs noted-- no surgical intervention needed. Patient to continue hard cervical collar all the time may remove her shower. Okay to start PT OT. --prn pain meds   Pulmonary emboli - nonocclusive PE seen on CT study, provoked given malignancy - c/w heparin gtt; transition to oral anticoag in am since no surgery is planned --Venous doppler LE --echo EF 55-60% , no right heart strain   Metastatic breast cancer Patient's therapy currently on hold in order to have rectovaginal fistula intervened upon Pt follows with Dr Lindi Adie at [facility]   Multiple rib fractures Likely source of patient's back pain - multimodal pain mgmt    DM-2 on Insulin - A1c >8 - SSI QAC/HS - NPH 10 u qhs; titrate prn   Procedures:none Family communication :per pt--family aware Consults : neurosurgery CODE STATUS: full DVT Prophylaxis :Heparin gtt Level of care: Med-Surg Status is: Inpatient Remains inpatient appropriate because: cervical fracture, PE on heparin gtt   Will d/c 1-2 days if remains stable  TOTAL TIME TAKING CARE OF THIS PATIENT: 35 minutes.  >50% time spent on counselling and coordination of care  Note: This dictation was prepared with Dragon dictation along with smaller phrase technology. Any  transcriptional errors that result from this process are unintentional.  Fritzi Mandes M.D    [facility]   CC: Primary care physician; Dorna Mai, MD

## 2022-02-07 NOTE — Progress Notes (Signed)
ANTICOAGULATION CONSULT NOTE  Pharmacy Consult for heparin infusion Indication: pulmonary embolus  Allergies  Allergen Reactions   Doxycycline Shortness Of Breath    Wheezing, shortness of breath, rash head to toe, and swelling   Naproxen Shortness Of Breath   Oxycodone-Acetaminophen Itching   Codeine Hives   Hydrocodone-Acetaminophen Hives   Ibuprofen     Irritant to stomach r/t diverticulitis   Lantus [Insulin Glargine]     Yeast Infections   Meloxicam Other (See Comments)    Abdominal pain    Propoxyphene N-Acetaminophen Hives   Sulfa Antibiotics Hives   Sulfonamide Derivatives Hives   Tramadol Other (See Comments)    Abdominal Pain    Patient Measurements: Height: '4\' 9"'$  (144.8 cm) Weight: 65.3 kg (144 lb) IBW/kg (Calculated) : 38.6 Heparin Dosing Weight: 53.4 kg  Vital Signs: Temp: 97.8 F (36.6 C) (12/17 0602) Temp Source: Oral (12/17 0602) BP: 137/90 (12/17 0602) Pulse Rate: 90 (12/17 0602)  Labs: Recent Labs    02/06/22 1857 02/06/22 2044 02/07/22 0539  HGB 13.6  --  12.1  HCT 42.2  --  37.4  PLT 406*  --  347  APTT 32  --   --   LABPROT  --   --  15.2  INR  --   --  1.2  HEPARINUNFRC  --   --  0.27*  CREATININE 0.77  --   --   TROPONINIHS  --  5  --      Estimated Creatinine Clearance: 61.8 mL/min (by C-G formula based on SCr of 0.77 mg/dL).   Medical History: Past Medical History:  Diagnosis Date   Anxiety state, unspecified    Arthritis    Breast cancer of lower-inner quadrant of right female breast ([place])    Carpal tunnel syndrome    Complication of anesthesia    woke up during ganglion cyst removal in her 20's, not woken up since   Diabetes mellitus without complication (HCC)    Type II   Diverticulosis    Heart murmur    History of radiation therapy 12/18/18- 01/30/19   Right Chest wall 25 fractions X 2Gy each to total 50 Gy, followed by a boost 10 Gy in 5 fractions.    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  Goal of Therapy:  Heparin level 0.3-0.7 units/ml Monitor platelets by anticoagulation protocol: Yes   12/17 0539 HL 0.27, subtherapeutic  Plan:  Bolus 800 units x 1 Increase heparin infusion to 1000 units/hr Will check HL in 6 hr after rate change CBC daily while on heparin  Renda Rolls, PharmD, [facility] 02/07/2022 6:41 AM

## 2022-02-07 NOTE — Progress Notes (Signed)
Neurosurgery-New Consultation Evaluation 02/07/2022 Emily Wilson [id number]  Identifying Statement: Emily Wilson is a 56 y.o. female from [place] [id number] with   Physician Requesting Consultation: Dr. Fritzi Mandes  History of Present Illness:  The patient is a 56 year old right-hand female past medical history significant for Stage IV metastatic breast CA with diffuse metastasis including to the thoracic and lumbar spine as well as calvarium who had presented with some dyspnea found on PE protocol CT new pulmonary emboli.  The patient had noted that in the severe coughing fit in the setting of her dyspnea she had experienced acute onset of neck pain in the high cervical craniocervical junction area with some neck spasms in that area.  She denied any radicular pain associated there is no numbness or tingling no difficulties with coordination of her hand or feet she continue to be able to ambulate without any difficulty denies any loss of bowel or bladder function.  CT scan imaging performed at same time in the emergency room demonstrated what appeared to be pathologic fractures of the C1 and C2 rings of the cervical spine in the setting of diffuse likely metastatic disease to the cervical spine.  Neurosurgery was consulted for further evaluation around this.  The patient has previously undergone radiation therapy to the thoracic spine at the levels of T8-T11 in October 2022.    Past Medical History:  Past Medical History:  Diagnosis Date   Anxiety state, unspecified    Arthritis    Breast cancer of lower-inner quadrant of right female breast ([place])    Carpal tunnel syndrome    Complication of anesthesia    woke up during ganglion cyst removal in her 20's, not woken up since   Diabetes mellitus without complication (HCC)    Type II   Diverticulosis    Heart murmur    History of radiation therapy 12/18/18- 01/30/19   Right Chest wall 25 fractions X 2Gy each to total 50 Gy,  followed by a boost 10 Gy in 5 fractions.    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, without mention of thyrotoxic crisis or storm     Social History: Social History   Socioeconomic History   Marital status: Divorced    Spouse name: Not on file   Number of children: 2   Years of education: Not on file   Highest education level: Not on file  Occupational History   Occupation: Disabled  Tobacco Use   Smoking status: Former    Packs/day: 3.00    Years: 30.00    Total pack years: 90.00    Types: Cigarettes    Quit date: 10/24/2011    Years since quitting: 10.2   Smokeless tobacco: Never  Vaping Use   Vaping Use: Never used  Substance and Sexual Activity   Alcohol use: No   Drug use: Never   Sexual activity: Not Currently  Other Topics Concern   Not on file  Social History Narrative   In [place] w/boyfriend   Works Chiropractor, Scientist, water quality   Social Determinants of Health   Financial Resource Strain: [place] (05/01/2021)   Overall Financial Resource Strain (CARDIA)    Difficulty of Paying Living Expenses: Somewhat hard  Food Insecurity: No Food Insecurity (05/01/2021)   Hunger Vital Sign    Worried About Running Out  of Food in the Last Year: Never true    [place] in the Last Year: Never true  Transportation Needs: No Transportation Needs (05/01/2021)   PRAPARE - Hydrologist (Medical): No    Lack of Transportation (Non-Medical): No  Physical Activity: Inactive (05/01/2021)   Exercise Vital Sign    Days of Exercise per Week: 0 days    Minutes of Exercise per Session: 0 min  Stress: Stress Concern Present (05/01/2021)   [employer] [place]    Feeling of Stress : Very much  Social Connections: Socially Isolated (05/01/2021)   Social  Connection and Isolation Panel [NHANES]    Frequency of Communication with Friends and Family: More than three times a week    Frequency of Social Gatherings with Friends and Family: More than three times a week    Attends Religious Services: Never    Marine scientist or Organizations: No    Attends Archivist Meetings: Never    Marital Status: Divorced  Human resources officer Violence: Not At Risk (05/01/2021)   Humiliation, Afraid, Rape, and Kick questionnaire    Fear of Current or Ex-Partner: No    Emotionally Abused: No    Physically Abused: No    Sexually Abused: No   Living arrangements (living alone, with partner): Lives at home with her dog  Family History: Family History  Problem Relation Age of Onset   Diabetes Mother    Bladder Cancer Mother 23       smoker   Other Mother 19       TAH for unspecified reason   Multiple sclerosis Mother    Kidney failure Mother    Cancer Father 84       lymphatic/tonsil cancer - in remission; former smoker   Diabetes Sister    Diabetes Sister    Heart attack Brother    COPD Maternal Grandmother        smoker   Emphysema Maternal Grandmother        smoker   Diabetes Maternal Grandmother    Stroke Maternal Grandfather    Diabetes Paternal Grandmother    Dementia Maternal Uncle    COPD Maternal Uncle        smoker   Multiple sclerosis Paternal Aunt    Breast cancer Paternal Aunt        dx. late 83s - early 30s    Review of Systems:  Review of Systems - General ROS: Negative Psychological ROS: Negative Ophthalmic ROS: Negative ENT ROS: Negative Hematological and Lymphatic ROS: Negative  Endocrine ROS: Negative Respiratory ROS: Negative Cardiovascular ROS: Negative Gastrointestinal ROS: Negative Genito-Urinary ROS: Negative Musculoskeletal ROS: Negative Neurological ROS: Negative Dermatological ROS: Negative  Physical Exam: BP (!) 137/90 (BP Location: Left Arm)   Pulse 90   Temp 97.8 F (36.6 C) (Oral)    Resp (!) 22   Ht '4\' 9"'$  (1.448 m)   Wt 65.3 kg   SpO2 99%   BMI 31.16 kg/m  Body mass index is 31.16 kg/m. Body surface area is 1.62 meters squared. General appearance: Alert, cooperative, in no acute distress Head: Normocephalic, atraumatic Eyes: Normal, EOM intact Oropharynx: Moist without lesions Neck: Supple, no tenderness Heart: Normal, regular rate and rhythm, without murmur Lungs: Clear to auscultation, good air exchange Abdomen: Soft, nondistended Ext: No edema in LE bilaterally, good distal pulses  Neurologic exam:  Mental status: alertness: alert, orientation: person, place, time, affect:  normal Speech: fluent and clear Cranial nerves:  II: Visual fields are full by confrontation, no ptosis III/IV/VI: extra-ocular motions intact bilaterally V/VII:no evidence of facial droop or weakness and facial sensation intact VIII: hearing normal XI: trapezius strength symmetric,  sternocleidomastoid strength symmetric XII: tongue strength symmetric  Motor:strength symmetric 5/5, normal muscle mass and tone in all extremities and no pronator drift Sensory: intact to light touch in all extremities Reflexes: 2+ and symmetric bilaterally for arms and legs Coordination: intact finger to nose Gait: normal   Laboratory: Results for orders placed or performed during the hospital encounter of 02/06/22  D-dimer, quantitative  Result Value Ref Range   D-Dimer, Quant 5.04 (H) 0.00 - 0.50 ug/mL-FEU  CBC with Differential  Result Value Ref Range   WBC 5.7 4.0 - 10.5 K/uL   RBC 4.47 3.87 - 5.11 MIL/uL   Hemoglobin 13.6 12.0 - 15.0 g/dL   HCT 42.2 36.0 - 46.0 %   MCV 94.4 80.0 - 100.0 fL   MCH 30.4 26.0 - 34.0 pg   MCHC 32.2 30.0 - 36.0 g/dL   RDW 13.9 11.5 - 15.5 %   Platelets 406 (H) 150 - 400 K/uL   nRBC 0.0 0.0 - 0.2 %   Neutrophils Relative % 70 %   Neutro Abs 4.0 1.7 - 7.7 K/uL   Lymphocytes Relative 12 %   Lymphs Abs 0.7 0.7 - 4.0 K/uL   Monocytes Relative 11 %   Monocytes  Absolute 0.6 0.1 - 1.0 K/uL   Eosinophils Relative 5 %   Eosinophils Absolute 0.3 0.0 - 0.5 K/uL   Basophils Relative 1 %   Basophils Absolute 0.0 0.0 - 0.1 K/uL   Immature Granulocytes 1 %   Abs Immature Granulocytes 0.07 0.00 - 0.07 K/uL  Comprehensive metabolic panel  Result Value Ref Range   Sodium 139 135 - 145 mmol/L   Potassium 4.7 3.5 - 5.1 mmol/L   Chloride 106 98 - 111 mmol/L   CO2 22 22 - 32 mmol/L   Glucose, Bld 185 (H) 70 - 99 mg/dL   BUN 15 6 - 20 mg/dL   Creatinine, Ser 0.77 0.44 - 1.00 mg/dL   Calcium 9.3 8.9 - 10.3 mg/dL   Total Protein 8.3 (H) 6.5 - 8.1 g/dL   Albumin 3.8 3.5 - 5.0 g/dL   AST 76 (H) 15 - 41 U/L   ALT 47 (H) 0 - 44 U/L   Alkaline Phosphatase 310 (H) 38 - 126 U/L   Total Bilirubin 0.4 0.3 - 1.2 mg/dL   GFR, Estimated >60 >60 mL/min   Anion gap 11 5 - 15  APTT  Result Value Ref Range   aPTT 32 24 - 36 seconds  Brain natriuretic peptide  Result Value Ref Range   B Natriuretic Peptide 22.1 0.0 - 100.0 pg/mL  CBC  Result Value Ref Range   WBC 5.3 4.0 - 10.5 K/uL   RBC 3.98 3.87 - 5.11 MIL/uL   Hemoglobin 12.1 12.0 - 15.0 g/dL   HCT 37.4 36.0 - 46.0 %   MCV 94.0 80.0 - 100.0 fL   MCH 30.4 26.0 - 34.0 pg   MCHC 32.4 30.0 - 36.0 g/dL   RDW 13.9 11.5 - 15.5 %   Platelets 347 150 - 400 K/uL   nRBC 0.0 0.0 - 0.2 %  Heparin level (unfractionated)  Result Value Ref Range   Heparin Unfractionated 0.27 (L) 0.30 - 0.70 IU/mL  Protime-INR  Result Value Ref Range   Prothrombin  Time 15.2 11.4 - 15.2 seconds   INR 1.2 0.8 - 1.2  Troponin I (High Sensitivity)  Result Value Ref Range   Troponin I (High Sensitivity) 5 <18 ng/L   I personally reviewed labs  Imaging:  EXAM: CT NECK WITH CONTRAST   TECHNIQUE: Multidetector CT imaging of the neck was performed using the standard protocol following the bolus administration of intravenous contrast.   RADIATION DOSE REDUCTION: This exam was performed according to the departmental  dose-optimization program which includes automated exposure control, adjustment of the mA and/or kV according to patient size and/or use of iterative reconstruction technique.   CONTRAST:  157m OMNIPAQUE IOHEXOL 300 MG/ML  SOLN   COMPARISON:  No direct comparison study available.   FINDINGS: Pharynx and larynx: The nasal cavity and nasopharynx are unremarkable.   An ovoid area of hypodensity in the region of the right palatine tonsil/glossotonsillar sulcus may reflect a retention cyst. The oral cavity and oropharynx are otherwise unremarkable. The parapharyngeal spaces are clear.   The hypopharynx and larynx are unremarkable. The vocal folds are normal in appearance.   There is no retropharyngeal fluid collection.  The airway is patent.   Salivary glands: The parotid and submandibular glands are unremarkable.   Thyroid: Unremarkable.   Lymph nodes: There is no pathologic lymphadenopathy in the neck.   Vascular: The major vasculature of the neck is unremarkable.   Limited intracranial: The imaged portions of the intracranial compartment are unremarkable.   Visualized orbits: The globes and orbits are unremarkable.   Mastoids and visualized paranasal sinuses: There is mild mucosal thickening in the left maxillary sinus. The mastoid air cells and middle ear cavities are clear.   Skeleton: There is extensive osseous metastatic disease. There is an expansile lesion in the left frontal calvarium with breakthrough of the inner and outer tables. There is no definite soft tissue component extending into the intracranial compartment.   Additional permeative lytic metastatic disease is seen in the occipital bone at the midline. There is a metastatic lesion in the left temporal bone (5-31). The adjacent sigmoid sinus is patent.   There is metastatic disease in the C1 ring involving the right lateral mass and left aspect of the posterior arch. There is extensive metastatic  involvement in the C2 vertebral body extending to the pedicle and left lamina and spinous process.   There is metastatic disease in the C5 and T1 spinous processes, T2 vertebral body and spinous process, and T3 posterior elements. There is a probable small metastatic lesion in the sternal manubrium. There is possible metastatic disease in the right mandibular condyle (8-24, 5-36). There is metastatic disease in the right scapula (5-98, 8-22). There are metastatic lesions in the bilateral second ribs.   Upper chest: Assessed on the separately dictated CT chest.   Other: None.   IMPRESSION: 1. No pathologic lymphadenopathy or suspicious soft tissue mass in the neck. 2. Extensive osseous metastatic disease in the calvarium, cervical spine, possibly right mandibular condyle, sternal manubrium, and right scapula.   Electronically Signed: By: PValetta MoleM.D. On: 02/05/2022 16:19  Results for orders placed during the hospital encounter of 02/06/22  MR Brain W and Wo Contrast  Narrative CLINICAL DATA:  Neck pain with known malignancy  EXAM: MRI HEAD WITHOUT AND WITH CONTRAST  MRI CERVICAL SPINE WITHOUT AND WITH CONTRAST  TECHNIQUE: Multiplanar, multiecho pulse sequences of the brain and surrounding structures, and cervical spine, to include the craniocervical junction and cervicothoracic junction, were obtained without and  with intravenous contrast.  CONTRAST:  70m GADAVIST GADOBUTROL 1 MMOL/ML IV SOLN  COMPARISON:  None Available.  FINDINGS: MRI HEAD FINDINGS  Brain: No acute infarct, mass effect or extra-axial collection. No acute or chronic hemorrhage. Normal white matter signal, parenchymal volume and CSF spaces. The midline structures are normal. There is no abnormal contrast enhancement.  Vascular: Major flow voids are preserved.  Skull spine: There are numerous contrast-enhancing metastases throughout the calvarium. The largest is at the posterior  midline.  Sinuses/Orbits:No paranasal sinus fluid levels or advanced mucosal thickening. No mastoid or middle ear effusion. Normal orbits.  MRI CERVICAL SPINE FINDINGS  Alignment: Physiologic.  Vertebrae: Pathologic fracture of the anterior C1 ring better characterized on earlier CT. There is no cervical spine compression fracture. There is diffuse osseous metastatic disease throughout the cervical spine. No epidural lesion.  Cord: Normal signal and morphology.  Posterior Fossa, vertebral arteries, paraspinal tissues: Negative.  Disc levels:  C1-2: Unremarkable.  C2-3: Normal disc space and facet joints. There is no spinal canal stenosis. No neural foraminal stenosis.  C3-4: Normal disc space and facet joints. There is no spinal canal stenosis. No neural foraminal stenosis.  C4-5: Normal disc space and facet joints. There is no spinal canal stenosis. No neural foraminal stenosis.  C5-6: Small disc bulge with right-greater-than-left uncovertebral spurring. There is no spinal canal stenosis. No neural foraminal stenosis.  C6-7: Normal disc space and facet joints. There is no spinal canal stenosis. No neural foraminal stenosis.  C7-T1: Normal disc space and facet joints. There is no spinal canal stenosis. No neural foraminal stenosis.  IMPRESSION: 1. 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 2. Pathologic fracture of the anterior C1 ring better characterized on earlier CT. 3. No intracranial metastatic disease. 4. No spinal canal or neural foraminal stenosis.   Electronically Signed By: KUlyses JarredM.D. On: 02/07/2022 00:58  I personally reviewed radiology studies to include:   Impression/Plan:  Patient is a 56year old right-hand female past medical history significant for IDDM hypothyroidism and known diffusely metastatic stage IV breast cancer who presented with a pulmonary emboli and dyspnea for several weeks found on  CT scan imaging of the cervical spine to have a fracture of the C1 and C2 cervical vertebrae from bony metastatic disease.  She noted that she had craniocervical junction pain that came on during a coughing fit in the setting of her dyspnea and coughing episodes in the context of her pulmonary embolism.  She has based on the CT scan and MRI imaging of the cervical spine she has diffuse bony metastatic disease throughout her cervical spine vertebrae.  The  imaging of her cervical spine demonstrates infiltrative processes with bone destruction raising a particular in the left lateral mass of C1 with the peers to be a fracture of the right anterior ring of C1 and on the posterior lamina on the left-hand side of C1.  There is more diffuse infiltration of disease within the body of C2 with bony destruction of the left lateral mass of C2 bony disease extending into the ring of C2.  There is however obvious evidence of metastatic disease throughout the cervical spine vertebrae.  She has maintained good alignment with her cervical spine there is no compression of the neurologic elements.  There is however potential risk for progression of these bony invasion further decline in cervical stability.  She has a reassuring neurologic exam and is neurologically intact on my exam.  She notes minimal neck pain  at this time and continues to be able to ambulate get to the bathroom and perform her normal motor functions. She likewise also has diffuse calvarial degradation from likely metastatic bony disease to her skull as well.  There is no intracranial lesion   1.  Diagnosis: Pathologic fractures of C1 and C2 in the setting of diffuse metastatic disease to the cervical spine vertebrae  2.  Plan Oncologic care and treatment of her diffuse metastatic disease per oncology and appreciate their input for guidance on the overall trajectory of her disease state. No acute neurosurgical intervention for the metastatic invasive  disease of the C1 and C2 vertebral bodies and the pathologic fractures therein.  However the patient should remain in the cervical collar at all times this can be removed to shower.  Patient may work with physical therapy and Occupational Therapy activity as tolerated as long as her cervical collar is in place Neurosurgery will continue to follow in consult  Luciano Cutter. Johnney Killian, M.D. Neurosurgery

## 2022-02-07 NOTE — H&P (Signed)
History and Physical    Patient: Emily Wilson GGY:[id number] DOB: Jul 11, 1965 DOA: 02/06/2022 DOS: the patient was seen and examined on 02/07/2022 PCP: Dorna Mai, MD  Patient coming from: Home  Chief Complaint:  Chief Complaint  Patient presents with   Neck Injury    Cervical fracture and blood clot in lung    HPI: Emily Wilson is a 56 y.o. female with medical history significant of metastatic breast cancer, insulin-dependent diabetes, hypothyroidism who presents for further review and evaluation of neck pain.  Patient reports that she has had progressive exertional dyspnea week she has had progressive progression of her symptoms with associated cough over the past 3 to 4 months.  She had a significant bout of cough that was followed by severe left central back pain that she has never had before.  She denies chest pain, palpitations, syncope or near syncope.  She is also noted that for about the past week she has had increasing stiffness to her neck and at one point was unable to turn towards the right.  The symptoms improved and that she was no longer able to turn her head towards the left and has had increasing neck pain over the past week.  She then had outpatient CT of her neck for further workup of her cancer and was then found to have incidental C1 and C2 fracture. In the ED, she was hemodynamically stable O2 sat appropriate on room air.  C-collar was placed and neurosurgery was consulted.    Review of Systems: As mentioned in the history of present illness. All other systems reviewed and are negative. Past Medical History:  Diagnosis Date   Anxiety state, unspecified    Arthritis    Breast cancer of lower-inner quadrant of right female breast [facility])    Carpal tunnel syndrome    Complication of anesthesia    woke up during ganglion cyst removal in her 20's, not woken up since   Diabetes mellitus without complication (HCC)    Type II   Diverticulosis    Heart  murmur    History of radiation therapy 12/18/18- 01/30/19   Right Chest wall 25 fractions X 2Gy each to total 50 Gy, followed by a boost 10 Gy in 5 fractions.    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 Her smoking use included cigarettes. She has a 90.00 pack-year smoking history. She has never used smokeless tobacco. She reports that she does not drink alcohol and does not use drugs.  Allergies  Allergen Reactions   Doxycycline Shortness Of Breath    Wheezing, shortness of breath, rash head to toe, and swelling   Naproxen Shortness Of Breath   Oxycodone-Acetaminophen Itching   Codeine Hives   Hydrocodone-Acetaminophen Hives  Ibuprofen     Irritant to stomach r/t diverticulitis   Lantus [Insulin Glargine]     Yeast Infections   Meloxicam Other (See Comments)    Abdominal pain    Propoxyphene N-Acetaminophen Hives   Sulfa Antibiotics Hives   Sulfonamide Derivatives Hives   Tramadol Other (See Comments)    Abdominal Pain    Family History  Problem Relation Age of Onset   Diabetes Mother    Bladder Cancer Mother 17       smoker   Other Mother 51       TAH for unspecified reason   Multiple sclerosis Mother    Kidney failure Mother    Cancer Father 32       lymphatic/tonsil cancer - in remission; former smoker    Diabetes Sister    Diabetes Sister    Heart attack Brother    COPD Maternal Grandmother        smoker   Emphysema Maternal Grandmother        smoker   Diabetes Maternal Grandmother    Stroke Maternal Grandfather    Diabetes Paternal Grandmother    Dementia Maternal Uncle    COPD Maternal Uncle        smoker   Multiple sclerosis Paternal Aunt    Breast cancer Paternal Aunt        dx. late 56s - early 63s    Prior to Admission medications   Medication Sig Start Date End Date Taking? Authorizing Provider  Accu-Chek FastClix Lancets MISC USE TO TEST BLOOD SUGAR UP TO FOUR TIMES DAILY AS DIRECTED 11/02/21  Yes Dorna Mai, MD  BD INSULIN SYRINGE U/F 31G X 5/16" 0.5 ML MISC USE TO ADMINISTER INSULIN 3 TIMES DAILY WITH MEALS 09/07/21  Yes Dorna Mai, MD  cyclobenzaprine (FLEXERIL) 5 MG tablet Take 1 tablet (5 mg total) by mouth at bedtime. Prn muscle pain 01/13/22  Yes Argentina Donovan, PA-C  denosumab (XGEVA) 120 MG/1.7ML SOLN injection Inject 120 mg into the skin every 3 (three) months.   Yes [provider]  fluticasone-salmeterol (ADVAIR DISKUS) 250-50 MCG/ACT AEPB Inhale 1 puff into the lungs in the morning and at bedtime. 01/13/22  Yes McClung, Levada Dy M, PA-C  glucose blood (ACCU-CHEK GUIDE) test strip USE TO CHECK BLOOD SUGAR UP TO 4 TIMES A DAY (E11.65) 07/31/21  Yes Dorna Mai, MD  insulin NPH Human (HUMULIN N) 100 UNIT/ML injection INJECT 12 UNITS INTO THE SKIN AT BEDTIME 07/23/20  Yes [name], MD  insulin regular (NOVOLIN R) 100 units/mL injection INJECT 12 UNITS TOTAL INTO THE SKIN 3X DAILY BEFORE MEALS. HOLD DOSE FOR BLOOD SUGAR LESS THAN 125 11/02/21  Yes Dorna Mai, MD  Ipratropium-Albuterol (COMBIVENT RESPIMAT) 20-100 MCG/ACT AERS respimat Inhale 1 puff into the lungs every 6 (six) hours. 11/10/21  Yes Nyoka Lint, PA-C  letrozole [facility]) 2.5 MG tablet TAKE 1 TABLET BY MOUTH EVERY DAY 12/28/21  Yes [name], MD  metoprolol succinate  (TOPROL-XL) 25 MG 24 hr tablet Take 1 tablet (25 mg total) by mouth daily. 04/14/21  Yes [name], MD  nitrofurantoin, macrocrystal-monohydrate, (MACROBID) 100 MG capsule Take 1 capsule (100 mg total) by mouth daily. 01/25/22  Yes [name], MD  umeclidinium bromide (INCRUSE ELLIPTA) 62.5 MCG/ACT AEPB Inhale 1 puff into the lungs daily. 01/27/22 01/27/23 Yes Dgayli, Berdine Addison, MD  albuterol (VENTOLIN HFA) 108 (90 Base) MCG/ACT inhaler Inhale 1-2 puffs into the lungs every 6 (six) hours as needed for wheezing or shortness  of breath. 11/10/21   Nyoka Lint, PA-C  alpelisib 200 mg daily dose (PIQRAY, 200 MG DAILY DOSE,) tablet Take 1 tablet (200 mg total) by mouth daily. Take with food daily. Patient not taking: Reported on 02/06/2022 11/04/21   [name], MD  diclofenac (VOLTAREN) 75 MG EC tablet Take 1 tablet (75 mg total) by mouth 2 (two) times daily as needed for mild pain or moderate pain. 11/23/21   [name], MD  gabapentin (NEURONTIN) 100 MG capsule Take 3 capsules (300 mg total) by mouth 3 (three) times daily. 02/06/19   [name], MD  HYDROcodone bit-homatropine (HYCODAN) 5-1.5 MG/5ML syrup Take 5 mLs by mouth every 4 (four) hours as needed for cough. Patient not taking: Reported on 02/06/2022 01/05/22   Dorna Mai, MD  methocarbamol (ROBAXIN-750) 750 MG tablet Take 1 tablet (750 mg total) by mouth every 8 (eight) hours as needed for muscle spasms. 02/03/22   [name], MD  ondansetron (ZOFRAN ODT) 8 MG disintegrating tablet Take 1 tablet (8 mg total) by mouth every 8 (eight) hours as needed for nausea or vomiting. 04/20/21   [name], MD    Physical Exam: Vitals:   02/07/22 0102 02/07/22 0150 02/07/22 0200 02/07/22 0300  BP: 113/73 125/74 121/78 122/85  Pulse: 87 90 92 92  Resp: 18 20 (!) 22 19  Temp:      TempSrc:      SpO2: 96% 97% 97% 94%  Weight:      Height:       Physical Exam Eyes:     Extraocular Movements: Extraocular movements intact.   Cardiovascular:     Rate and Rhythm: Normal rate and regular rhythm.     Heart sounds: Murmur heard.  Pulmonary:     Effort: Pulmonary effort is normal. No respiratory distress.     Breath sounds: Normal breath sounds.  Abdominal:     General: There is distension.     Palpations: Abdomen is soft.     Tenderness: There is no abdominal tenderness.  Musculoskeletal:        General: No swelling.  Skin:    General: Skin is warm and dry.  Neurological:     General: No focal deficit present.     Mental Status: She is alert and oriented to person, place, and time.     Data Reviewed:  ECG notable for NSR  Assessment and Plan: No notes have been filed under this hospital service. Service: Hospitalist Emily Wilson is a 56 year old female with medical history notable for metastatic breast cancer, insulin dep type 2 diabetes who is admitted with pathological fracture.  #Cervical fracture, pathological/fragility - C1/C2 with likely fracture - Neurosurgery consulted ~1030pm, requested transfer to [facility] care but no beds, NSGY have not seen patient yet but recommend MRI and Cervical collar - MRI pending - maintain C collar - check vit D levels  #Pulmonary emboli - nonocclusive PE seen on CT study, provoked given malignancy - c/w heparin gtt; transition to oral anticoag when surgical plan clarified  #Metastatic breast cancer Patient's therapy currently on hold in order to have rectovaginal fistula intervened upon  #Multiple rib fractures Likely source of patient's back pain - multimodal pain mgmt   #IDDM - A1c >8 - ISS QAC/HS - NPH 8u qhs; titrate prn    Advance Care Planning:   Code Status: Full Code   Consults: Neurosurg  Family Communication: Son Emily Wilson [phone]   Severity of Illness: The appropriate patient status for this patient  is INPATIENT. Inpatient status is judged to be reasonable and necessary in order to provide the required intensity of service  to ensure the patient's safety. The patient's presenting symptoms, physical exam findings, and initial radiographic and laboratory data in the context of their chronic comorbidities is felt to place them at high risk for further clinical deterioration. Furthermore, it is not anticipated that the patient will be medically stable for discharge from the hospital within 2 midnights of admission.   * I certify that at the point of admission it is my clinical judgment that the patient will require inpatient hospital care spanning beyond 2 midnights from the point of admission due to high intensity of service, high risk for further deterioration and high frequency of surveillance required.*  Author: Cecille Rubin, MD 02/07/2022 3:36 AM  For on call review [email or web address].

## 2022-02-07 NOTE — Progress Notes (Signed)
ANTICOAGULATION CONSULT NOTE  Pharmacy Consult for heparin infusion Indication: pulmonary embolus  Allergies  Allergen Reactions   Doxycycline Shortness Of Breath    Wheezing, shortness of breath, rash head to toe, and swelling   Naproxen Shortness Of Breath   Oxycodone-Acetaminophen Itching   Codeine Hives   Hydrocodone-Acetaminophen Hives   Ibuprofen     Irritant to stomach r/t diverticulitis   Lantus [Insulin Glargine]     Yeast Infections   Meloxicam Other (See Comments)    Abdominal pain    Propoxyphene N-Acetaminophen Hives   Sulfa Antibiotics Hives   Sulfonamide Derivatives Hives   Tramadol Other (See Comments)    Abdominal Pain    Patient Measurements: Height: '4\' 9"'$  (144.8 cm) Weight: 62.9 kg (138 lb 10.7 oz) IBW/kg (Calculated) : 38.6 Heparin Dosing Weight: 53.4 kg  Vital Signs: Temp: 98.2 F (36.8 C) (12/17 2108) Temp Source: Oral (12/17 1834) BP: 118/87 (12/17 2108) Pulse Rate: 93 (12/17 2108)  Labs: Recent Labs    02/06/22 1857 02/06/22 2044 02/07/22 0539 02/07/22 0722 02/07/22 1432 02/07/22 2051  HGB 13.6  --  12.1  --   --   --   HCT 42.2  --  37.4  --   --   --   PLT 406*  --  347  --   --   --   APTT 32  --   --   --   --   --   LABPROT  --   --  15.2  --   --   --   INR  --   --  1.2  --   --   --   HEPARINUNFRC  --   --  0.27*  --  0.32 0.30  CREATININE 0.77  --   --  0.54  --   --   TROPONINIHS  --  5  --   --   --   --      Estimated Creatinine Clearance: 60.6 mL/min (by C-G formula based on SCr of 0.54 mg/dL).   Medical History: Past Medical History:  Diagnosis Date   Anxiety state, unspecified    Arthritis    Breast cancer of lower-inner quadrant of right female breast ([place])    Carpal tunnel syndrome    Complication of anesthesia    woke up during ganglion cyst removal in her 20's, not woken up since   Diabetes mellitus without complication (HCC)    Type II   Diverticulosis    Heart murmur    History of radiation  therapy 12/18/18- 01/30/19   Right Chest wall 25 fractions X 2Gy each to total 50 Gy, followed by a boost 10 Gy in 5 fractions.    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 nonocclusive PE seen on CT study, provoked given malignancy   Baseline INR 1.2, aPTT 32s  12/17 @ 2051 HL 0.30, therapeutic x 2  Goal of Therapy:  Heparin level 0.3-0.7 units/ml Monitor platelets by anticoagulation protocol: Yes  Plan: heparin level therapeutic x 2 ---continue heparin infusion at 1000 units/hr ---Will recheck heparin level 12/18 with am labs ---CBC daily while  on heparin  Lorin Picket, PharmD 02/07/2022 9:32 PM

## 2022-02-07 NOTE — Progress Notes (Signed)
OT Cancellation Note  Patient Details Name: Emily Wilson MRN: [id number] DOB: Jul 17, 1965   Cancelled Treatment:    Reason Eval/Treat Not Completed: Other (comment) (Per chart, pt has a PE and heparin started at 0059. Per therapy protocols, will hold until at least 24 hrs post inital heparin administration. OT will continue to follow acutely.)  Shanon Payor, OTD OTR/L  02/07/22, 8:44 AM

## 2022-02-08 ENCOUNTER — Encounter: Payer: Self-pay | Admitting: Hematology and Oncology

## 2022-02-08 ENCOUNTER — Other Ambulatory Visit (HOSPITAL_COMMUNITY): Payer: Self-pay

## 2022-02-08 DIAGNOSIS — S12001A Unspecified nondisplaced fracture of first cervical vertebra, initial encounter for closed fracture: Secondary | ICD-10-CM | POA: Diagnosis not present

## 2022-02-08 DIAGNOSIS — C7951 Secondary malignant neoplasm of bone: Secondary | ICD-10-CM | POA: Diagnosis not present

## 2022-02-08 DIAGNOSIS — M4850XA Collapsed vertebra, not elsewhere classified, site unspecified, initial encounter for fracture: Secondary | ICD-10-CM | POA: Diagnosis not present

## 2022-02-08 DIAGNOSIS — M4852XA Collapsed vertebra, not elsewhere classified, cervical region, initial encounter for fracture: Secondary | ICD-10-CM | POA: Diagnosis not present

## 2022-02-08 LAB — CBC
HCT: 38.8 % (ref 36.0–46.0)
Hemoglobin: 12.6 g/dL (ref 12.0–15.0)
MCH: 29.9 pg (ref 26.0–34.0)
MCHC: 32.5 g/dL (ref 30.0–36.0)
MCV: 92.2 fL (ref 80.0–100.0)
Platelets: 357 10*3/uL (ref 150–400)
RBC: 4.21 MIL/uL (ref 3.87–5.11)
RDW: 13.8 % (ref 11.5–15.5)
WBC: 5.1 10*3/uL (ref 4.0–10.5)
nRBC: 0 % (ref 0.0–0.2)

## 2022-02-08 LAB — HEPARIN LEVEL (UNFRACTIONATED): Heparin Unfractionated: 0.53 IU/mL (ref 0.30–0.70)

## 2022-02-08 LAB — GLUCOSE, CAPILLARY
Glucose-Capillary: 130 mg/dL — ABNORMAL HIGH (ref 70–99)
Glucose-Capillary: 215 mg/dL — ABNORMAL HIGH (ref 70–99)

## 2022-02-08 MED ORDER — APIXABAN 5 MG PO TABS
10.0000 mg | ORAL_TABLET | Freq: Two times a day (BID) | ORAL | Status: DC
  Administered 2022-02-08: 10 mg via ORAL
  Filled 2022-02-08: qty 2

## 2022-02-08 MED ORDER — APIXABAN 5 MG PO TABS: 5.0000 mg | ORAL_TABLET | Freq: Two times a day (BID) | ORAL | Status: DC

## 2022-02-08 MED ORDER — GUAIFENESIN 100 MG/5ML PO LIQD: 5.0000 mL | ORAL | 0 refills | Status: DC | PRN

## 2022-02-08 MED ORDER — APIXABAN 5 MG PO TABS: 10.0000 mg | ORAL_TABLET | Freq: Two times a day (BID) | ORAL | 3 refills | Status: AC

## 2022-02-08 NOTE — Evaluation (Signed)
Occupational Therapy Evaluation Patient Details Name: Emily Wilson MRN: [id number] DOB: 1965-06-06 Today's Date: 02/08/2022   History of Present Illness Patient is 56 y/o female and  had outpatient CT neck which showed C1 C2 ring fracture  patient was asked to come to the emergency room. Further evaluation noted she has PE as well. Denies any chest pain. Breathing comfortably. Sats 96 to 97% room evaluation and found to have PE.   Clinical Impression   Upon entering the room, pt supine in bed with son present in room. Pt is agreeable to OT intervention and evaluation. Pt is at home during the day by herself with use of RW for ambulation. Pt's family is available at night and friends intermittently during the day to assist as needed. Pt is Ind with self care at baseline. OT educated and demonstrated how to don/doff hard collar to son and pt who verbalized understanding. Cervical precautions explained and pt demonstrated figure four position to don/doff LB clothing items. Pt stands and ambulates with RW to bathroom for toileting tasks with supervision overall. Pt returning to bed at end of session with sit >supine at mod I level. All needs within reach. Pt does not need skilled acute OT intervention at this time.      Recommendations for follow up therapy are one component of a multi-disciplinary discharge planning process, led by the attending physician.  Recommendations may be updated based on patient status, additional functional criteria and insurance authorization.   Follow Up Recommendations  No OT follow up        Patient can return home with the following Assist for transportation;Help with stairs or ramp for entrance;Assistance with cooking/housework    Functional Status Assessment  Patient has had a recent decline in their functional status and demonstrates the ability to make significant improvements in function in a reasonable and predictable amount of time.  Equipment  Recommendations  None recommended by OT       Precautions / Restrictions Precautions Precautions: Fall;Cervical Required Braces or Orthoses: Cervical Brace Cervical Brace: Hard collar;At all times (off for shower) Restrictions Weight Bearing Restrictions: No      Mobility Bed Mobility Overal bed mobility: Modified Independent             General bed mobility comments: increased time and cuing for technique    Transfers Overall transfer level: Modified independent Equipment used: Rolling walker (2 wheels)                      Balance Overall balance assessment: Modified Independent                                         ADL either performed or assessed with clinical judgement   ADL Overall ADL's : Modified independent                                       General ADL Comments: supervision overall with use of RW to ambulate to bathroom for toileting needs with no physical assistance. Pt demonstrates figure four position to don B LEs.     Vision Patient Visual Report: No change from baseline              Pertinent Vitals/Pain Pain Assessment Pain Assessment: No/denies pain  Extremity/Trunk Assessment Upper Extremity Assessment Upper Extremity Assessment: Overall WFL for tasks assessed   Lower Extremity Assessment Lower Extremity Assessment: Defer to PT evaluation       Communication Communication Communication: No difficulties   Cognition Arousal/Alertness: Awake/alert Behavior During Therapy: WFL for tasks assessed/performed Overall Cognitive Status: Within Functional Limits for tasks assessed                                 General Comments: Pt is pleasant and agreeable during session.                Home Living Family/patient expects to be discharged to:: Private residence Living Arrangements: Spouse/significant other;Children Available Help at Discharge:  Family;Friend(s);Available PRN/intermittently (Family at night and friends intermittently available during the day) Type of Home: Mobile home Home Access: Stairs to enter [employer] of Steps: 1 Entrance Stairs-Rails: None Home Layout: One level     Bathroom Shower/Tub: Tub/shower unit         Home Equipment: Conservation officer, nature (2 wheels);Shower seat;Grab bars - tub/shower          Prior Functioning/Environment Prior Level of Function : Independent/Modified Independent               ADLs Comments: use of RW for functional mobility. Family will assist with IADLs and taking her to appointments as needed.                 OT Goals(Current goals can be found in the care plan section) Acute Rehab OT Goals Patient Stated Goal: to return home OT Goal Formulation: With patient/family Time For Goal Achievement: 02/08/22 Potential to Achieve Goals: Good  OT Frequency:         AM-PAC OT "6 Clicks" Daily Activity     Outcome Measure Help from another person eating meals?: None Help from another person taking care of personal grooming?: None Help from another person toileting, which includes using toliet, bedpan, or urinal?: None Help from another person bathing (including washing, rinsing, drying)?: None Help from another person to put on and taking off regular upper body clothing?: None Help from another person to put on and taking off regular lower body clothing?: None 6 Click Score: 24   End of Session Equipment Utilized During Treatment: Rolling walker (2 wheels);Other (comment) (mi[place] J hard collar) Nurse Communication: Mobility status  Activity Tolerance: Patient tolerated treatment well Patient left: in bed;with call bell/phone within reach;with bed alarm set                   Time: 4970-2637 OT Time Calculation (min): 22 min Charges:  OT General Charges $OT Visit: 1 Visit OT Evaluation $OT Eval Low Complexity: 1 Low OT Treatments $Self Care/Home  Management : 8-22 mins Darleen Crocker, MS, OTR/L , CBIS ascom [phone]  02/08/22, 12:04 PM

## 2022-02-08 NOTE — TOC Benefit Eligibility Note (Signed)
Patient Teacher, English as a foreign language completed.    The patient is currently admitted and upon discharge could be taking Eliquis 5 mg.  The current 30 day co-pay is $0.00.   The patient is currently admitted and upon discharge could be taking Xarelto 20 mg.  The current 30 day co-pay is $0.00.   The patient is insured through [place], [place] Patient Advocate Specialist [place] Patient Advocate Team Direct Number: [phone]  Fax: [phone]

## 2022-02-08 NOTE — Plan of Care (Signed)
  Problem: Pain Managment: Goal: General experience of comfort will improve Outcome: Progressing   Problem: Safety: Goal: Ability to remain free from injury will improve Outcome: Progressing   Problem: Skin Integrity: Goal: Risk for impaired skin integrity will decrease Outcome: Progressing   

## 2022-02-08 NOTE — Care Management CC44 (Signed)
Condition Code 44 Documentation Completed  Patient Details  Name: KADRA KOHAN MRN: [id number] Date of Birth: [birth year]   Condition Code 44 given:  Yes Patient signature on Condition Code 44 notice:  Yes Documentation of 2 MD's agreement:  Yes Code 44 added to claim:  Yes    Judeen Hammans Bryanah Sidell, LCSW 02/08/2022, 12:29 PM

## 2022-02-08 NOTE — Progress Notes (Signed)
ANTICOAGULATION CONSULT NOTE  Pharmacy Consult for heparin infusion Indication: pulmonary embolus  Allergies  Allergen Reactions   Doxycycline Shortness Of Breath    Wheezing, shortness of breath, rash head to toe, and swelling   Naproxen Shortness Of Breath   Oxycodone-Acetaminophen Itching   Codeine Hives   Hydrocodone-Acetaminophen Hives   Ibuprofen     Irritant to stomach r/t diverticulitis   Lantus [Insulin Glargine]     Yeast Infections   Meloxicam Other (See Comments)    Abdominal pain    Propoxyphene N-Acetaminophen Hives   Sulfa Antibiotics Hives   Sulfonamide Derivatives Hives   Tramadol Other (See Comments)    Abdominal Pain    Patient Measurements: Height: '4\' 9"'$  (144.8 cm) Weight: 62.9 kg (138 lb 10.7 oz) IBW/kg (Calculated) : 38.6 Heparin Dosing Weight: 53.4 kg  Vital Signs: Temp: 98.2 F (36.8 C) (12/17 2108) BP: 118/87 (12/17 2108) Pulse Rate: 93 (12/17 2108)  Labs: Recent Labs    02/06/22 1857 02/06/22 1857 02/06/22 2044 02/07/22 0539 02/07/22 0722 02/07/22 1432 02/07/22 2051 02/08/22 0512  HGB 13.6  --   --  12.1  --   --   --  12.6  HCT 42.2  --   --  37.4  --   --   --  38.8  PLT 406*  --   --  347  --   --   --  357  APTT 32  --   --   --   --   --   --   --   LABPROT  --   --   --  15.2  --   --   --   --   INR  --   --   --  1.2  --   --   --   --   HEPARINUNFRC  --    < >  --  0.27*  --  0.32 0.30 0.53  CREATININE 0.77  --   --   --  0.54  --   --   --   TROPONINIHS  --   --  5  --   --   --   --   --    < > = values in this interval not displayed.     Estimated Creatinine Clearance: 60.6 mL/min (by C-G formula based on SCr of 0.54 mg/dL).   Medical History: Past Medical History:  Diagnosis Date   Anxiety state, unspecified    Arthritis    Breast cancer of lower-inner quadrant of right female breast ([place])    Carpal tunnel syndrome    Complication of anesthesia    woke up during ganglion cyst removal in her 20's, not  woken up since   Diabetes mellitus without complication (HCC)    Type II   Diverticulosis    Heart murmur    History of radiation therapy 12/18/18- 01/30/19   Right Chest wall 25 fractions X 2Gy each to total 50 Gy, followed by a boost 10 Gy in 5 fractions.    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 nonocclusive PE seen on CT study, provoked  given malignancy   Baseline INR 1.2, aPTT 32s  12/17 @ 2051 HL 0.30, therapeutic x 2 12/18 0512 HL 0.53, therapeutic x 3  Goal of Therapy:  Heparin level 0.3-0.7 units/ml Monitor platelets by anticoagulation protocol: Yes  Plan: ---continue heparin infusion at 1000 units/hr ---Will recheck heparin level 12/19 with am labs ---CBC daily while on heparin  Renda Rolls, PharmD, [facility] 02/08/2022 6:35 AM

## 2022-02-08 NOTE — Progress Notes (Signed)
DISCHARGE NOTE  Pt given discharge instructions and verbalize understanding. Cervical collar on and in place. Pt wheeled to car by staff, and son providing transportation.

## 2022-02-08 NOTE — Progress Notes (Signed)
  Transition of Care [facility]) Screening Note   Patient Details  Name: Emily Wilson Date of Birth: Mar 30, 1965   Transition of Care [facility]) CM/SW Contact:    Quin Hoop, LCSW Phone Number: 02/08/2022, 9:47 AM    Transition of Care Department [facility]) has reviewed patient and no TOC needs have been identified at this time. We will continue to monitor patient advancement through interdisciplinary progression rounds. If new patient transition needs arise, please place a TOC consult.

## 2022-02-08 NOTE — Discharge Summary (Signed)
Physician Discharge Summary   Patient: Emily Wilson MRN: [id number] DOB: 12-10-1965  Admit date:     02/06/2022  Discharge date: 02/08/22  Discharge Physician: Fritzi Mandes   PCP: Dorna Mai, MD   Recommendations at discharge:   follow-up Dr. Cari Caraway neurosurgery in 1 to 2 weeks follow-up Dr. Lamar Blinks at the cancer center on the appointment that will be scheduled follow-up with PCP and one week continue to wear heart collar neck all the time. Can be removed for showering.  Discharge Diagnoses: Principal Problem:   Cervical spine fracture ([place]) Active Problems:   Carcinoma of breast metastatic to bone [facility])   Pathologic compression fracture of vertebra (H[facility])   Multiple subsegmental pulmonary emboli without acute cor pulmonale [facility])  Hospital Course: Emily Wilson is a 56 y.o. female with medical history significant of metastatic breast cancer, insulin-dependent diabetes, hypothyroidism who presents for further review and evaluation of neck pain.  Patient reports that she has had progressive exertional dyspnea week she has had progressive progression of her symptoms with associated cough over the past 3 to 4 months.  She had a significant bout of cough that was followed by severe left central back pain that she has never had before.      CT of her neck for further workup of her cancer and was then found to have C1 and C2  ring fracture. MRI cervical spine confirms it.   Cervical fracture, pathological Metastatic Breast cancer - C1/C2 ring fracture - Neurosurgery consulted. Dr Samson Frederic recs noted-- no surgical intervention needed. Patient to continue hard cervical collar all the time may remove her shower. Okay to start PT OT. --prn pain meds --pt will f/u Dr Izora Ribas in [place] in 1-2 weeks --worked with PT/OT--no needs. Has all equipment at home   Pulmonary emboli - nonocclusive PE seen on CT study, provoked given malignancy - c/w heparin gtt; transition to  oral anticoag in am since no surgery is planned --Venous doppler LE --echo EF 55-60% , no right heart strain --ok to change to po eliquis per NS   Metastatic breast cancer Patient's therapy currently on hold in order to have rectovaginal fistula intervened upon Pt follows with Dr Lindi Adie at [facility].  --Dr Lindi Adie informed of pt's condition   Multiple rib fractures Likely source of patient's back pain - multimodal pain mgmt    DM-2 on Insulin - A1c >8 - SSI QAC/HS - NPH 10 u qhs; titrate prn     Procedures:none Family communication :son at bedside Consults : neurosurgery CODE STATUS: full DVT Prophylaxis :eliquis      Pain control - [place] Controlled Substance Reporting System database was reviewed. and patient was instructed, not to drive, operate heavy machinery, perform activities at heights, swimming or participation in water activities or provide baby-sitting services while on Pain, Sleep and Anxiety Medications; until their outpatient Physician has advised to do so again. Also recommended to not to take more than prescribed Pain, Sleep and Anxiety Medications.  Disposition: Home Diet recommendation:  Discharge Diet Orders (From admission, onward)     Start     Ordered   02/08/22 0000  Diet - low sodium heart healthy        02/08/22 1111           Cardiac and Carb modified diet DISCHARGE MEDICATION: Allergies as of 02/08/2022       Reactions   Doxycycline Shortness Of Breath   Wheezing, shortness of breath, rash head to toe,  and swelling   Naproxen Shortness Of Breath   Oxycodone-acetaminophen Itching   Codeine Hives   Hydrocodone-acetaminophen Hives   Ibuprofen    Irritant to stomach r/t diverticulitis   Lantus [insulin Glargine]    Yeast Infections   Meloxicam Other (See Comments)   Abdominal pain   Propoxyphene N-acetaminophen Hives   Sulfa Antibiotics Hives   Sulfonamide Derivatives Hives   Tramadol Other (See Comments)   Abdominal Pain         Medication List     STOP taking these medications    HYDROcodone bit-homatropine 5-1.5 MG/5ML syrup Commonly known as: HYCODAN   letrozole 2.5 MG tablet Commonly known as: FEMARA   nitrofurantoin (macrocrystal-monohydrate) 100 MG capsule Commonly known as: Macrobid   Piqray (200 MG Daily Dose) tablet Generic drug: alpelisib 200 mg daily dose       TAKE these medications    Accu-Chek FastClix Lancets Misc USE TO TEST BLOOD SUGAR UP TO FOUR TIMES DAILY AS DIRECTED   Accu-Chek Guide test strip Generic drug: glucose blood USE TO CHECK BLOOD SUGAR UP TO 4 TIMES A DAY (E11.65)   albuterol 108 (90 Base) MCG/ACT inhaler Commonly known as: VENTOLIN HFA Inhale 1-2 puffs into the lungs every 6 (six) hours as needed for wheezing or shortness of breath.   apixaban 5 MG Tabs tablet Commonly known as: ELIQUIS Take 2 tablets (10 mg total) by mouth 2 (two) times daily. From 02/15/22 take 5 mg twice a day   BD Insulin Syringe U/F 31G X 5/16" 0.5 ML Misc Generic drug: Insulin Syringe-Needle U-100 USE TO ADMINISTER INSULIN 3 TIMES DAILY WITH MEALS   Combivent Respimat 20-100 MCG/ACT Aers respimat Generic drug: Ipratropium-Albuterol Inhale 1 puff into the lungs every 6 (six) hours.   cyclobenzaprine 5 MG tablet Commonly known as: FLEXERIL Take 1 tablet (5 mg total) by mouth at bedtime. Prn muscle pain   diclofenac 75 MG EC tablet Commonly known as: VOLTAREN Take 1 tablet (75 mg total) by mouth 2 (two) times daily as needed for mild pain or moderate pain.   fluticasone-salmeterol 250-50 MCG/ACT Aepb Commonly known as: Advair Diskus Inhale 1 puff into the lungs in the morning and at bedtime.   gabapentin 100 MG capsule Commonly known as: NEURONTIN Take 3 capsules (300 mg total) by mouth 3 (three) times daily.   guaiFENesin 100 MG/5ML liquid Commonly known as: ROBITUSSIN Take 5 mLs by mouth every 4 (four) hours as needed for cough or to loosen phlegm.   Incruse  Ellipta 62.5 MCG/ACT Aepb Generic drug: umeclidinium bromide Inhale 1 puff into the lungs daily.   insulin NPH Human 100 UNIT/ML injection Commonly known as: HumuLIN N INJECT 12 UNITS INTO THE SKIN AT BEDTIME   insulin regular 100 units/mL injection Commonly known as: NovoLIN R INJECT 12 UNITS TOTAL INTO THE SKIN 3X DAILY BEFORE MEALS. HOLD DOSE FOR BLOOD SUGAR LESS THAN 125   methocarbamol 750 MG tablet Commonly known as: Robaxin-750 Take 1 tablet (750 mg total) by mouth every 8 (eight) hours as needed for muscle spasms.   metoprolol succinate 25 MG 24 hr tablet Commonly known as: TOPROL-XL Take 1 tablet (25 mg total) by mouth daily.   ondansetron 8 MG disintegrating tablet Commonly known as: Zofran ODT Take 1 tablet (8 mg total) by mouth every 8 (eight) hours as needed for nausea or vomiting.   Xgeva 120 MG/1.7ML Soln injection Generic drug: denosumab Inject 120 mg into the skin every 3 (three) months.  Follow-up Information     Dorna Mai, MD. Schedule an appointment as soon as possible for a visit in 1 week(s).   Specialty: Family Medicine Contact information: [place] [address] [phone]         [name], MD. Go to.   Specialty: Hematology and Oncology Why: on your appt when scheduled Contact information: [place] [phone] [place], MD. Schedule an appointment as soon as possible for a visit in 2 week(s).   Specialty: Neurosurgery Why: C 1-2 fracture Contact information: [address] [place] [address] [phone]                 [employer]   02/06/22 1845 02/07/22 2036  Weight: 65.3 kg 62.9 kg     Condition at discharge: fair  The results of significant diagnostics from this hospitalization (including imaging, microbiology, ancillary and laboratory) are listed below for reference.   Imaging  Studies: ECHOCARDIOGRAM COMPLETE  Result Date: 02/07/2022    ECHOCARDIOGRAM REPORT   Patient Name:   Emily Wilson Date of Exam: 02/07/2022 Medical Rec #:  [id number]        Height:       57.0 in Accession #:    [id number]       Weight:       144.0 lb Date of Birth:  09/13/1965       BSA:          1.564 m Patient Age:    20 years         BP:           137/90 mmHg Patient Gender: F                HR:           99 bpm. Exam Location:  [facility] Procedure: 2D Echo Indications:     Dyspnea R06.00  History:         Patient has no prior history of Echocardiogram examinations.  Sonographer:     Kathlen Brunswick RDCS Referring Phys:  [id number] CRYSTAL AZU Diagnosing Phys: Ida Rogue MD  Sonographer Comments: This study was performed with the patient supine, unable to turn due to neck collar placed. IMPRESSIONS  1. Left ventricular ejection fraction, by estimation, is 60 to 65%. The left ventricle has normal function. The left ventricle has no regional wall motion abnormalities. Left ventricular diastolic parameters are consistent with Grade I diastolic dysfunction (impaired relaxation).  2. Right ventricular systolic function is normal. The right ventricular size is normal. Tricuspid regurgitation signal is inadequate for assessing PA pressure.  3. The mitral valve is normal in structure. No evidence of mitral valve regurgitation. No evidence of mitral stenosis.  4. The aortic valve was not well visualized. Aortic valve regurgitation is not visualized. No aortic stenosis is present.  5. The inferior vena cava is normal in size with greater than 50% respiratory variability, suggesting right atrial pressure of 3 mmHg. FINDINGS  Left Ventricle: Left ventricular ejection fraction, by estimation, is 60 to 65%. The left ventricle has normal function. The left ventricle has no regional wall motion abnormalities. The left ventricular internal cavity size was normal in size. There is  no left ventricular hypertrophy. Left  ventricular diastolic parameters are consistent with Grade I diastolic dysfunction (impaired relaxation). Right Ventricle: The right ventricular size is normal. No increase in right ventricular wall thickness.  Right ventricular systolic function is normal. Tricuspid regurgitation signal is inadequate for assessing PA pressure. Left Atrium: Left atrial size was normal in size. Right Atrium: Right atrial size was normal in size. Pericardium: There is no evidence of pericardial effusion. Mitral Valve: The mitral valve is normal in structure. No evidence of mitral valve regurgitation. No evidence of mitral valve stenosis. Tricuspid Valve: The tricuspid valve is normal in structure. Tricuspid valve regurgitation is not demonstrated. No evidence of tricuspid stenosis. Aortic Valve: The aortic valve was not well visualized. Aortic valve regurgitation is not visualized. No aortic stenosis is present. Aortic valve peak gradient measures 8.3 mmHg. Pulmonic Valve: The pulmonic valve was normal in structure. Pulmonic valve regurgitation is not visualized. No evidence of pulmonic stenosis. Aorta: The aortic root is normal in size and structure. Venous: The inferior vena cava is normal in size with greater than 50% respiratory variability, suggesting right atrial pressure of 3 mmHg. IAS/Shunts: No atrial level shunt detected by color flow Doppler.  LEFT VENTRICLE PLAX 2D LVIDd:         3.40 cm     Diastology LVIDs:         2.40 cm     LV e' medial:    7.72 cm/s LV PW:         0.90 cm     LV E/e' medial:  8.4 LV IVS:        0.90 cm     LV e' lateral:   11.40 cm/s LVOT diam:     1.80 cm     LV E/e' lateral: 5.7 LV SV:         49 LV SV Index:   31 LVOT Area:     2.54 cm  LV Volumes (MOD) LV vol d, MOD A2C: 45.0 ml LV vol d, MOD A4C: 32.9 ml LV vol s, MOD A2C: 17.7 ml LV vol s, MOD A4C: 13.9 ml LV SV MOD A2C:     27.3 ml LV SV MOD A4C:     32.9 ml LV SV MOD BP:      23.4 ml RIGHT VENTRICLE RV Basal diam:  2.50 cm RV S prime:      19.70 cm/s TAPSE (M-mode): 2.1 cm LEFT ATRIUM             Index        RIGHT ATRIUM          Index LA diam:        2.60 cm 1.66 cm/m   RA Area:     6.43 cm LA Vol (A2C):   18.2 ml 11.63 ml/m  RA Volume:   9.02 ml  5.77 ml/m LA Vol (A4C):   13.9 ml 8.89 ml/m LA Biplane Vol: 17.0 ml 10.87 ml/m  AORTIC VALVE                 PULMONIC VALVE AV Area (Vmax): 2.16 cm     PV Vmax:       1.00 m/s AV Vmax:        144.00 cm/s  PV Peak grad:  4.0 mmHg AV Peak Grad:   8.3 mmHg LVOT Vmax:      122.00 cm/s LVOT Vmean:     79.400 cm/s LVOT VTI:       0.191 m  AORTA Ao Root diam: 2.70 cm MITRAL VALVE MV Area (PHT): 6.60 cm    SHUNTS MV Decel Time: 115 msec    Systemic VTI:  0.19 m MV  E velocity: 64.50 cm/s  Systemic Diam: 1.80 cm MV A velocity: 87.50 cm/s MV E/A ratio:  0.74 Ida Rogue MD Electronically signed by Ida Rogue MD Signature Date/Time: 02/07/2022/11:36:22 AM    Final    MR Brain W and Wo Contrast  Result Date: 02/07/2022 CLINICAL DATA:  Neck pain with known malignancy EXAM: MRI HEAD WITHOUT AND WITH CONTRAST MRI CERVICAL SPINE WITHOUT AND WITH CONTRAST TECHNIQUE: Multiplanar, multiecho pulse sequences of the brain and surrounding structures, and cervical spine, to include the craniocervical junction and cervicothoracic junction, were obtained without and with intravenous contrast. CONTRAST:  7m GADAVIST GADOBUTROL 1 MMOL/ML IV SOLN COMPARISON:  None Available. FINDINGS: MRI HEAD FINDINGS Brain: No acute infarct, mass effect or extra-axial collection. No acute or chronic hemorrhage. Normal white matter signal, parenchymal volume and CSF spaces. The midline structures are normal. There is no abnormal contrast enhancement. Vascular: Major flow voids are preserved. Skull spine: There are numerous contrast-enhancing metastases throughout the calvarium. The largest is at the posterior midline. Sinuses/Orbits:No paranasal sinus fluid levels or advanced mucosal thickening. No mastoid or middle ear effusion.  Normal orbits. MRI CERVICAL SPINE FINDINGS Alignment: Physiologic. Vertebrae: Pathologic fracture of the anterior C1 ring better characterized on earlier CT. There is no cervical spine compression fracture. There is diffuse osseous metastatic disease throughout the cervical spine. No epidural lesion. Cord: Normal signal and morphology. Posterior Fossa, vertebral arteries, paraspinal tissues: Negative. Disc levels: C1-2: Unremarkable. C2-3: Normal disc space and facet joints. There is no spinal canal stenosis. No neural foraminal stenosis. C3-4: Normal disc space and facet joints. There is no spinal canal stenosis. No neural foraminal stenosis. C4-5: Normal disc space and facet joints. There is no spinal canal stenosis. No neural foraminal stenosis. C5-6: Small disc bulge with right-greater-than-left uncovertebral spurring. There is no spinal canal stenosis. No neural foraminal stenosis. C6-7: Normal disc space and facet joints. There is no spinal canal stenosis. No neural foraminal stenosis. C7-T1: Normal disc space and facet joints. There is no spinal canal stenosis. No neural foraminal stenosis. IMPRESSION: 1. 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 2. Pathologic fracture of the anterior C1 ring better characterized on earlier CT. 3. No intracranial metastatic disease. 4. No spinal canal or neural foraminal stenosis. Electronically Signed   By: KUlyses JarredM.D.   On: 02/07/2022 00:58   MR Cervical Spine W or Wo Contrast  Result Date: 02/07/2022 CLINICAL DATA:  Neck pain with known malignancy EXAM: MRI HEAD WITHOUT AND WITH CONTRAST MRI CERVICAL SPINE WITHOUT AND WITH CONTRAST TECHNIQUE: Multiplanar, multiecho pulse sequences of the brain and surrounding structures, and cervical spine, to include the craniocervical junction and cervicothoracic junction, were obtained without and with intravenous contrast. CONTRAST:  718mGADAVIST GADOBUTROL 1 MMOL/ML IV SOLN COMPARISON:   None Available. FINDINGS: MRI HEAD FINDINGS Brain: No acute infarct, mass effect or extra-axial collection. No acute or chronic hemorrhage. Normal white matter signal, parenchymal volume and CSF spaces. The midline structures are normal. There is no abnormal contrast enhancement. Vascular: Major flow voids are preserved. Skull spine: There are numerous contrast-enhancing metastases throughout the calvarium. The largest is at the posterior midline. Sinuses/Orbits:No paranasal sinus fluid levels or advanced mucosal thickening. No mastoid or middle ear effusion. Normal orbits. MRI CERVICAL SPINE FINDINGS Alignment: Physiologic. Vertebrae: Pathologic fracture of the anterior C1 ring better characterized on earlier CT. There is no cervical spine compression fracture. There is diffuse osseous metastatic disease throughout the cervical spine. No epidural lesion. Cord: Normal signal  and morphology. Posterior Fossa, vertebral arteries, paraspinal tissues: Negative. Disc levels: C1-2: Unremarkable. C2-3: Normal disc space and facet joints. There is no spinal canal stenosis. No neural foraminal stenosis. C3-4: Normal disc space and facet joints. There is no spinal canal stenosis. No neural foraminal stenosis. C4-5: Normal disc space and facet joints. There is no spinal canal stenosis. No neural foraminal stenosis. C5-6: Small disc bulge with right-greater-than-left uncovertebral spurring. There is no spinal canal stenosis. No neural foraminal stenosis. C6-7: Normal disc space and facet joints. There is no spinal canal stenosis. No neural foraminal stenosis. C7-T1: Normal disc space and facet joints. There is no spinal canal stenosis. No neural foraminal stenosis. IMPRESSION: 1. 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 2. Pathologic fracture of the anterior C1 ring better characterized on earlier CT. 3. No intracranial metastatic disease. 4. No spinal canal or neural foraminal stenosis.  Electronically Signed   By: Ulyses Jarred M.D.   On: 02/07/2022 00:58   CT Angio Chest PE W and/or Wo Contrast  Result Date: 02/06/2022 CLINICAL DATA:  High probability for PE.  Cough. EXAM: CT ANGIOGRAPHY CHEST WITH CONTRAST TECHNIQUE: Multidetector CT imaging of the chest was performed using the standard protocol during bolus administration of intravenous contrast. Multiplanar CT image reconstructions and MIPs were obtained to evaluate the vascular anatomy. RADIATION DOSE REDUCTION: This exam was performed according to the departmental dose-optimization program which includes automated exposure control, adjustment of the mA and/or kV according to patient size and/or use of iterative reconstruction technique. CONTRAST:  62m OMNIPAQUE IOHEXOL 350 MG/ML SOLN COMPARISON:  CT chest abdomen and pelvis 02/05/2022 FINDINGS: Cardiovascular: There is adequate opacification of the pulmonary arteries. There are nonocclusive pulmonary emboli within the left lower lobe pulmonary artery extending into segmental branches. There also segmental nonocclusive pulmonary emboli in the left upper lobe. Main pulmonary artery is normal in size. Heart and aorta are normal in size. There is no pericardial effusion. Mediastinum/Nodes: No discrete enlarged lymph nodes are identified, unchanged. Visualized thyroid gland and esophagus are within normal limits. Lungs/Pleura: Small bilateral pleural effusions are similar to prior. Mild emphysematous changes are again seen. There is peripheral smooth interlobular septal thickening favored as mild edema. There is some nodular thickening of the bilateral major fissures and along the pleural surfaces inferiorly. Scattered micro nodules measuring 3 mm or less are seen in the left upper lobe and left lower lobe. There is no evidence for pneumothorax. Upper Abdomen: Multiple liver lesions are unchanged, likely metastatic disease. Musculoskeletal: Right mastectomy changes again noted. Diffuse  osseous metastatic disease again noted. Multiple pathologic fractures involving the ribs bilaterally appear unchanged from prior. L1 compression deformity is unchanged from prior. Review of the MIP images confirms the above findings. IMPRESSION: 1. Nonocclusive pulmonary emboli involving the left lower lobe and left upper lobe. No evidence for right heart strain. 2. Stable small bilateral pleural effusions. 3. Mild interstitial edema. 4. Nodular thickening of the pleural surfaces worrisome for metastatic disease. 5. Stable diffuse osseous metastatic disease with pathologic fractures of the ribs bilaterally. Electronically Signed   By: ARonney AstersM.D.   On: 02/06/2022 22:40   CT CHEST ABDOMEN PELVIS W CONTRAST  Addendum Date: 02/05/2022   ADDENDUM REPORT: 02/05/2022 17:17 ADDENDUM: The original report was by Dr. WVan Clines The following addendum is by Dr. WVan Clines Critical Value/emergent results were called by telephone at the time of interpretation on 02/05/2022 at 5:05 pm to provider Dr. JLoma Messing Who verbally acknowledged these  results. In addition to discussing the left-sided pulmonary embolus, numerous new bilateral rib fractures, and other substantial findings of progressive metastatic disease, we also discussed the appearance suspicious for pathologic fracture of the ring of C1 and possible pathologic fracture of the C2 vertebral body shown on the CT of the neck. Electronically Signed   By: Van Clines M.D.   On: 02/05/2022 17:17   Result Date: 02/05/2022 CLINICAL DATA:  Breast cancer restaging * Tracking Code: BO * EXAM: CT CHEST, ABDOMEN, AND PELVIS WITH CONTRAST TECHNIQUE: Multidetector CT imaging of the chest, abdomen and pelvis was performed following the standard protocol during bolus administration of intravenous contrast. RADIATION DOSE REDUCTION: This exam was performed according to the departmental dose-optimization program which includes automated exposure  control, adjustment of the mA and/or kV according to patient size and/or use of iterative reconstruction technique. CONTRAST:  193m OMNIPAQUE IOHEXOL 300 MG/ML  SOLN COMPARISON:  09/30/2021 FINDINGS: CT CHEST FINDINGS Cardiovascular: Primarily peripheral but potentially some central filling defect in the left upper lobe and left lower lobe pulmonary arteries compatible with chronic pulmonary embolus potentially with a component of acute pulmonary embolus. Today's exam was not optimized to detect or characterized pulmonary embolus. Coronary, aortic arch, and branch vessel atherosclerotic vascular disease. Mediastinum/Nodes: Similar to previous there is some edema around the lower trachea, coronal, and left mainstem bronchus potentially from radiation therapy or previously treated disease. No overt pathologic thoracic adenopathy currently identified. There is likely a small type 1 hiatal hernia. Lungs/Pleura: Small bilateral pleural effusions. Emphysema. Bilateral interstitial accentuation increased from previous. 4 mm left upper lobe subpleural nodule on image 70 series 8, previously 3 mm in diameter. Pleural-based nodularity along the fissures is substantially increased from prior and somewhat diffuse along the fissures. Musculoskeletal: Progressive and worsening primarily lytic but some sclerotic lesions along the ribs, bilateral scapula, right proximal humerus, sternum, and thoracic spine. Numerous fractures of the ribs, many of which although not all of which are pathologic, including the right second, fifth, sixth, seventh, eighth, ninth, tenth, eleventh, and twelfth ribs; and the left third, fourth, fifth, seventh, ninth, and tenth ribs. Many of these rib fractures are new compared to previous. Multilevel metastatic involvement in the thoracic spine mostly similar to prior with some evolutionary findings including increased lytic posterior element involvement at the T10 vertebral level. CT ABDOMEN PELVIS  FINDINGS Hepatobiliary: Substantially increased metastatic involvement of the liver with current involvement of all segments. A new medial segment left hepatic lobe mass measures 7.1 by 6.8 cm on image 48 series 3. A previous lateral segment left hepatic lobe mass measures 4.1 by 2.5 cm on image 57 series 3, formerly 1.6 cm in diameter. No current biliary dilatation. Pancreas: Dorsal pancreatic duct dilatation without a well-defined pancreatic mass. Spleen: Unremarkable Adrenals/Urinary Tract: Left adrenal mass 1.6 by 2.0 cm, nonspecific enhancement pattern. The kidneys and urinary bladder appear unremarkable. Stomach/Bowel: Sigmoid colon diverticulosis. Vascular/Lymphatic: Atherosclerosis is present, including aortoiliac atherosclerotic disease. Reproductive: Uterus absent.  Adnexa unremarkable. Other: No supplemental non-categorized findings. Musculoskeletal: Progressive metastatic disease with increased moth-eaten appearance of the iliac crests compared to prior new or increased right femoral neck metastatic lesion. Increased lysis of the left ischial tuberosity. Multiple lumbar spine metastatic lesions are identified. New 40% anterior compression fracture at L1 is likely pathologic and is associated with 2-3 mm of posterior bony retropulsion. IMPRESSION: 1. Substantial and extensive progression of metastatic disease to the liver and skeleton. 2. 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 3. Small bilateral pleural effusions with pleural-based nodularity along the fissures, suspicious for pleural metastatic disease. 4. New 40% anterior compression fracture at L1, likely pathologic, with 2-3 mm of posterior bony retropulsion. 5. Numerous new bilateral rib fractures, most of which are pathologic 6. Nonspecific 2 cm left adrenal mass, possibly metastatic. 7. Other imaging findings of potential clinical  significance: Coronary, aortic arch, and branch vessel atherosclerotic disease. Sigmoid colon diverticulosis. 8. Dorsal pancreatic duct dilatation without a well-defined pancreatic mass. Aortic Atherosclerosis (ICD10-I70.0) and Emphysema (ICD10-J43.9). Radiology assistant personnel have been notified to put me in telephone contact with the referring physician or the referring physician's clinical representative in order to discuss these findings. Once this communication is established I will issue an addendum to this report for documentation purposes. Electronically Signed: By: Van Clines M.D. On: 02/05/2022 16:35   CT Soft Tissue Neck W Contrast  Addendum Date: 02/05/2022   ADDENDUM REPORT: 02/05/2022 16:34 ADDENDUM: On further review, there is a suspected pathologic fracture through the anterior C1 ring (5-48) and left posterior aspect of the C1 ring (5-46). There is a possible pathologic fracture through the C2 vertebral body (5-53). This addendum will be called to the ordering provider by Dr. Janeece Fitting. Electronically Signed   By: Valetta Mole M.D.   On: 02/05/2022 16:34   Result Date: 02/05/2022 CLINICAL DATA:  Metastatic breast cancer. EXAM: CT NECK WITH CONTRAST TECHNIQUE: Multidetector CT imaging of the neck was performed using the standard protocol following the bolus administration of intravenous contrast. RADIATION DOSE REDUCTION: This exam was performed according to the departmental dose-optimization program which includes automated exposure control, adjustment of the mA and/or kV according to patient size and/or use of iterative reconstruction technique. CONTRAST:  174m OMNIPAQUE IOHEXOL 300 MG/ML  SOLN COMPARISON:  No direct comparison study available. FINDINGS: Pharynx and larynx: The nasal cavity and nasopharynx are unremarkable. An ovoid area of hypodensity in the region of the right palatine tonsil/glossotonsillar sulcus may reflect a retention cyst. The oral cavity and oropharynx  are otherwise unremarkable. The parapharyngeal spaces are clear. The hypopharynx and larynx are unremarkable. The vocal folds are normal in appearance. There is no retropharyngeal fluid collection.  The airway is patent. Salivary glands: The parotid and submandibular glands are unremarkable. Thyroid: Unremarkable. Lymph nodes: There is no pathologic lymphadenopathy in the neck. Vascular: The major vasculature of the neck is unremarkable. Limited intracranial: The imaged portions of the intracranial compartment are unremarkable. Visualized orbits: The globes and orbits are unremarkable. Mastoids and visualized paranasal sinuses: There is mild mucosal thickening in the left maxillary sinus. The mastoid air cells and middle ear cavities are clear. Skeleton: There is extensive osseous metastatic disease. There is an expansile lesion in the left frontal calvarium with breakthrough of the inner and outer tables. There is no definite soft tissue component extending into the intracranial compartment. Additional permeative lytic metastatic disease is seen in the occipital bone at the midline. There is a metastatic lesion in the left temporal bone (5-31). The adjacent sigmoid sinus is patent. There is metastatic disease in the C1 ring involving the right lateral mass and left aspect of the posterior arch. There is extensive metastatic involvement in the C2 vertebral body extending to the pedicle and left lamina and spinous process. There is metastatic disease in the C5 and T1 spinous processes, T2 vertebral body and spinous process, and T3 posterior elements. There is a probable small metastatic lesion  in the sternal manubrium. There is possible metastatic disease in the right mandibular condyle (8-24, 5-36). There is metastatic disease in the right scapula (5-98, 8-22). There are metastatic lesions in the bilateral second ribs. Upper chest: Assessed on the separately dictated CT chest. Other: None. IMPRESSION: 1. No  pathologic lymphadenopathy or suspicious soft tissue mass in the neck. 2. Extensive osseous metastatic disease in the calvarium, cervical spine, possibly right mandibular condyle, sternal manubrium, and right scapula. Electronically Signed: By: Valetta Mole M.D. On: 02/05/2022 16:19    Microbiology: Results for orders placed or performed in visit on 09/29/21  Urine Culture     Status: Abnormal   Collection Time: 09/29/21  4:08 PM   Specimen: Urine, Random  Result Value Ref Range Status   Specimen Description URINE, RANDOM  Final   Special Requests NONE  Final   Culture (A)  Final    <10,000 COLONIES/mL INSIGNIFICANT GROWTH Performed at [facility], [address] [place], [place] [address]    Report Status 09/30/2021 FINAL  Final    Labs: CBC: Recent Labs  Lab 02/06/22 1857 02/07/22 0539 02/08/22 0512  WBC 5.7 5.3 5.1  NEUTROABS 4.0  --   --   HGB 13.6 12.1 12.6  HCT 42.2 37.4 38.8  MCV 94.4 94.0 92.2  PLT 406* 347 465   Basic Metabolic Panel: Recent Labs  Lab 02/06/22 1857 02/07/22 0722  NA 139 137  K 4.7 4.2  CL 106 113*  CO2 22 17*  GLUCOSE 185* 162*  BUN 15 11  CREATININE 0.77 0.54  CALCIUM 9.3 8.2*   Liver Function Tests: Recent Labs  Lab 02/06/22 1857  AST 76*  ALT 47*  ALKPHOS 310*  BILITOT 0.4  PROT 8.3*  ALBUMIN 3.8   CBG: Recent Labs  Lab 02/07/22 0837 02/07/22 1405 02/07/22 1706 02/07/22 2220 02/08/22 0810  GLUCAP 229* 187* 163* 133* 130*    Discharge time spent: greater than 30 minutes.  Signed: Fritzi Mandes, MD [facility] 02/08/2022

## 2022-02-08 NOTE — Evaluation (Addendum)
Physical Therapy Evaluation Patient Details Name: Emily Wilson MRN: [id number] DOB: 1965-10-17 Today's Date: 02/08/2022  History of Present Illness  Emily Wilson is a 76yoF who comes [facility]/06/22 via PCP after imaging revealing of cervical spine fracture. Imaging also revealing of PE. PMH: metastatic BrCA, DM, HLD, hyperTSH. Per neurosurgical consult, "Pathologic fractures of C1 and C2 in the setting of diffuse metastatic disease to the cervical spine vertebrae." Recommendations for collar in place except for showering.  Clinical Impression  Pt admitted with above Dx. Per policy, pt's with acute PE require 48 hours therapeutic anticoagulation prior to working with out services, however pt seen at 36 hours AC at request of physician. Pt has functional limitations due to deficits below (see "PT Problem List"). Pt able to perform all bed mobility, transfers, AMB, and stairs performance at minGuardA or less, mild but unconcerning dyspnea with exertion. Pt reports confidence in being able to safely navigate her home environment. No DME recommended at this time. No services recommended at DC. Will signoff and allow mobility needs to be met per NSG.   No data found.      Recommendations for follow up therapy are one component of a multi-disciplinary discharge planning process, led by the attending physician.  Recommendations may be updated based on patient status, additional functional criteria and insurance authorization.  Follow Up Recommendations No PT follow up      Assistance Recommended at Discharge Set up Supervision/Assistance  Patient can return home with the following  Assist for transportation;Assistance with cooking/housework    Equipment Recommendations None recommended by PT  Recommendations for Other Services       Functional Status Assessment Patient has had a recent decline in their functional status and demonstrates the ability to make significant improvements in  function in a reasonable and predictable amount of time.     Precautions / Restrictions Precautions Precautions: Fall;Cervical Precaution Comments: has not been anticoagulated x48 hours until after midnight 12/19 Required Braces or Orthoses: Cervical Brace Cervical Brace: Hard collar;At all times Restrictions Weight Bearing Restrictions: No      Mobility  Bed Mobility Overal bed mobility: Modified Independent                  Transfers Overall transfer level: Modified independent Equipment used: Rolling walker (2 wheels), None                    Ambulation/Gait Ambulation/Gait assistance: Min guard Gait Distance (Feet): 350 Feet Assistive device: Rolling walker (2 wheels) Gait Pattern/deviations: Step-through pattern       General Gait Details: mild DOE but improved since prior to admission  Stairs Stairs: Yes Stairs assistance: Min guard Stair Management: Forwards, One rail Right, Two rails Number of Stairs: 4    Wheelchair Mobility    Modified Rankin (Stroke Patients Only)       Balance                                             Pertinent Vitals/Pain      Home Living Family/patient expects to be discharged to:: Private residence Living Arrangements: Spouse/significant other;Children Available Help at Discharge: Family;Friend(s);Available PRN/intermittently (Family at night and friends intermittently available during the day) Type of Home: Mobile home Home Access: Stairs to enter Entrance Stairs-Rails: None Entrance Stairs-Number of Steps: 1   Home Layout: One level  Home Equipment: Conservation officer, nature (2 wheels);Shower seat;Grab bars - tub/shower      Prior Function Prior Level of Function : Independent/Modified Independent               ADLs Comments: use of RW for functional mobility. Family will assist with IADLs and taking her to appointments as needed.     Hand Dominance        Extremity/Trunk  Assessment   Upper Extremity Assessment Upper Extremity Assessment: Overall WFL for tasks assessed    Lower Extremity Assessment Lower Extremity Assessment: Defer to PT evaluation       Communication   Communication: No difficulties  Cognition                                                General Comments      Exercises     Assessment/Plan    PT Assessment Patient does not need any further PT services  PT Problem List         PT Treatment Interventions      PT Goals (Current goals can be found in the Care Plan section)  Acute Rehab PT Goals PT Goal Formulation: All assessment and education complete, DC therapy    Frequency       Co-evaluation               AM-PAC PT "6 Clicks" Mobility  Outcome Measure Help needed turning from your back to your side while in a flat bed without using bedrails?: None Help needed moving from lying on your back to sitting on the side of a flat bed without using bedrails?: None Help needed moving to and from a bed to a chair (including a wheelchair)?: None Help needed standing up from a chair using your arms (e.g., wheelchair or bedside chair)?: None Help needed to walk in hospital room?: A Little Help needed climbing 3-5 steps with a railing? : A Little 6 Click Score: 22    End of Session Equipment Utilized During Treatment: Gait belt Activity Tolerance: Patient tolerated treatment well;No increased pain Patient left: in bed;with family/visitor present;with call bell/phone within reach Nurse Communication: Mobility status PT Visit Diagnosis: Other abnormalities of gait and mobility (R26.89)    Time: 2426-8341 PT Time Calculation (min) (ACUTE ONLY): 26 min   Charges:   PT Evaluation $PT Eval Moderate Complexity: 1 Mod PT Treatments $Therapeutic Activity: 8-22 mins       12:36 PM, 02/08/22 Emily Wilson, PT, DPT Physical Therapist - [facility]   [phone] ([place])    Emily Wilson C 02/08/2022, 12:32 PM

## 2022-02-09 ENCOUNTER — Ambulatory Visit: Payer: Medicare Other | Admitting: Hematology and Oncology

## 2022-02-09 ENCOUNTER — Other Ambulatory Visit: Payer: Medicare Other

## 2022-02-09 ENCOUNTER — Ambulatory Visit: Payer: Medicare Other

## 2022-02-09 ENCOUNTER — Telehealth: Payer: Self-pay

## 2022-02-09 NOTE — Progress Notes (Incomplete)
Patient Care Team: Dorna Mai, MD as PCP - General (Family Medicine) [name], MD as PCP - Cardiology (Cardiology) Eppie Gibson, MD as Attending Physician (Radiation Oncology) [name], MD as Consulting Physician (Hematology and Oncology) Loletha Carrow Kirke Corin, MD as Consulting Physician (Gastroenterology) Delice Bison, Charlestine Massed, NP as Nurse Practitioner (Hematology and Oncology) [name], RPH-CPP (Pharmacist) Lazaro Arms, RN as [place] [facility]  DIAGNOSIS: No diagnosis found.  SUMMARY OF ONCOLOGIC HISTORY: Oncology History  Malignant neoplasm of lower-inner quadrant of right breast of female, estrogen receptor positive ([place])  02/19/2015 Mammogram   Right breast irregular mass lower inner quadrant 2.2 x 1.3 x 1.7 cm with architectural distortion and skin/nipple retraction, T2 N0 stage II a clinical stage, additional benign cysts largest 1.2 cm   02/20/2015 Initial Diagnosis   Right breast biopsy 5:00: Invasive ductal carcinoma grade 2, perineural invasion present, ER 90%, PR 70%, HER-2 negative ratio 0.95, Ki-67 15%    03/07/2015 Breast MRI   right breast inferior subareolar mass measuring 2.2 x 2.4 x 2.5 cm with skin and nipple enhancement, extending inferiorly and laterally is intraductal enhancement over a distance of 4 cm, several level I right axillary lymph nodes with cortical thickening   03/13/2015 -  Anti-estrogen oral therapy   Neoadjuvant antiestrogen therapy with tamoxifen 20 mg daily (because the patient did not want to undergo surgery immediately for multiple family reasons) stopped in late 2017; anastrozole started 05/11/18   05/23/2018 Imaging   CT CAP: 2.9 cm spiculated soft tissue density central right breast, no evidence of soft tissue metastatic disease within the chest abdomen pelvis.  Subcentimeter sclerotic bone lesions T10, left pubis, right posterior ilium Bone scan: Foci of uptake left frontal calvarium, T-spine,  right ilium   10/04/2018 - 02/2021 Anti-estrogen oral therapy    Anastrozole with Delton See started April 2020 Ibrance added 09/29/2018, Faslodex 11/20/2020 discontinued January 2023 (due to progression and toxicity)   11/16/2018 Surgery   Right mastectomy Ninfa Linden): IDC, grade 1, 5.2cm, grossly involving underlying skin, clear margins.   12/07/2018 Cancer Staging   Staging form: Breast, AJCC 7th Edition - Pathologic stage from 12/07/2018: Stage IV (yT4b, NX, M1) - Signed by Eppie Gibson, MD on 12/07/2018   12/18/2018 - 01/30/2019 Radiation Therapy   12/18/2018 through 01/30/2019 Site Technique Total Dose (Gy) Dose per Fx (Gy) Completed Fx Beam Energies  Chest Wall, Right: CW_Rt 3D 50/50 2 25/25 10X, 6X  Chest Wall, Right: CW_Rt_SCV_PAB 3D 50/50 2 25/25 6X, 10X  Chest Wall, Right: CW_Rt_Bst Electron 10/10 2 5/5 6E    05/16/2019 Miscellaneous   Caris molecular testing: ER positive, PI K3 CA mutation present, HER-2 negative, ER positive TMB low BRCA1 not detected, ESR 1 not detected, PD-L1 negative   11/19/2020 - 12/02/2020 Radiation Therapy   11/19/2020 through 12/02/2020 Site Technique Total Dose (Gy) Dose per Fx (Gy) Completed Fx Beam Energies  Thoracic Spine: Spine_ T8-T11 3D 30/30 3 10/10 10X, 15X  Femur Left: Ext_Lt_femur Complex 30/30 3 10/10 10X  Lumbar Spine: Spine_L4-S4 3D 30/30 3 10/10 10X, 15X    04/11/2021 Treatment Plan Change   Verzinio with letrozole along with Xgeva started 04/11/2021   06/16/2021 Imaging   CT chest/abd/pelivs w/contrast  IMPRESSION: 1. No evidence of breast cancer carcinoma progression. 2. New linear interstitial thickening and bronchiectasis in the medial RIGHT lower lobe. Query radiation treatment or aspiration. 3. Multifocal skeletal metastasis involving the pelvis, spine, ribs and shoulder girdles. No interval change.  CHIEF COMPLIANT: Follow-up right breast cancer  INTERVAL HISTORY: Emily Wilson is a is a 56 y.o female with a history of right  breast cancer currently on letrozole therapy and Xgeva.  She presents to the clinic for a follow-up.   ALLERGIES:  is allergic to doxycycline, naproxen, oxycodone-acetaminophen, codeine, hydrocodone-acetaminophen, ibuprofen, lantus [insulin glargine], meloxicam, propoxyphene n-acetaminophen, sulfa antibiotics, sulfonamide derivatives, and tramadol.  MEDICATIONS:  Current Outpatient Medications  Medication Sig Dispense Refill   Accu-Chek FastClix Lancets MISC USE TO TEST BLOOD SUGAR UP TO FOUR TIMES DAILY AS DIRECTED 102 each 5   albuterol (VENTOLIN HFA) 108 (90 Base) MCG/ACT inhaler Inhale 1-2 puffs into the lungs every 6 (six) hours as needed for wheezing or shortness of breath. 8 g 1   apixaban (ELIQUIS) 5 MG TABS tablet Take 2 tablets (10 mg total) by mouth 2 (two) times daily. From 02/15/22 take 5 mg twice a day 60 tablet 3   BD INSULIN SYRINGE U/F 31G X 5/16" 0.5 ML MISC USE TO ADMINISTER INSULIN 3 TIMES DAILY WITH MEALS 100 each 3   cyclobenzaprine (FLEXERIL) 5 MG tablet Take 1 tablet (5 mg total) by mouth at bedtime. Prn muscle pain 30 tablet 1   denosumab (XGEVA) 120 MG/1.7ML SOLN injection Inject 120 mg into the skin every 3 (three) months.     diclofenac (VOLTAREN) 75 MG EC tablet Take 1 tablet (75 mg total) by mouth 2 (two) times daily as needed for mild pain or moderate pain. 60 tablet 3   fluticasone-salmeterol (ADVAIR DISKUS) 250-50 MCG/ACT AEPB Inhale 1 puff into the lungs in the morning and at bedtime. 60 each 2   gabapentin (NEURONTIN) 100 MG capsule Take 3 capsules (300 mg total) by mouth 3 (three) times daily. 90 capsule 1   glucose blood (ACCU-CHEK GUIDE) test strip USE TO CHECK BLOOD SUGAR UP TO 4 TIMES A DAY (E11.65) 200 strip 2   guaiFENesin (ROBITUSSIN) 100 MG/5ML liquid Take 5 mLs by mouth every 4 (four) hours as needed for cough or to loosen phlegm. 120 mL 0   insulin NPH Human (HUMULIN N) 100 UNIT/ML injection INJECT 12 UNITS INTO THE SKIN AT BEDTIME 10 mL 1   insulin  regular (NOVOLIN R) 100 units/mL injection INJECT 12 UNITS TOTAL INTO THE SKIN 3X DAILY BEFORE MEALS. HOLD DOSE FOR BLOOD SUGAR LESS THAN 125 30 mL 3   Ipratropium-Albuterol (COMBIVENT RESPIMAT) 20-100 MCG/ACT AERS respimat Inhale 1 puff into the lungs every 6 (six) hours. 4 g 1   methocarbamol (ROBAXIN-750) 750 MG tablet Take 1 tablet (750 mg total) by mouth every 8 (eight) hours as needed for muscle spasms. 60 tablet 0   metoprolol succinate (TOPROL-XL) 25 MG 24 hr tablet Take 1 tablet (25 mg total) by mouth daily. 90 tablet 3   ondansetron (ZOFRAN ODT) 8 MG disintegrating tablet Take 1 tablet (8 mg total) by mouth every 8 (eight) hours as needed for nausea or vomiting. 20 tablet 1   umeclidinium bromide (INCRUSE ELLIPTA) 62.5 MCG/ACT AEPB Inhale 1 puff into the lungs daily. 30 each 11   No current facility-administered medications for this visit.    PHYSICAL EXAMINATION: ECOG PERFORMANCE STATUS: {CHL ONC ECOG PS:[id number]}  There were no vitals filed for this visit. There were no vitals filed for this visit.  BREAST:*** No palpable masses or nodules in either right or left breasts. No palpable axillary supraclavicular or infraclavicular adenopathy no breast tenderness or nipple discharge. (exam performed in the presence of a chaperone)    LABORATORY DATA:  I have reviewed the data as listed    Latest Ref Rng & Units 02/07/2022    7:22 AM 02/06/2022    6:57 PM 01/25/2022    1:54 PM  CMP  Glucose 70 - 99 mg/dL 162  185  244   BUN 6 - 20 mg/dL _0 Creatinine 0.44 - 1.00 mg/dL 0.54  0.77  1.00   Sodium 135 - 145 mmol/L 137  139  137   Potassium 3.5 - 5.1 mmol/L 4.2  4.7  3.8   Chloride 98 - 111 mmol/L 113  106  99   CO2 22 - 32 mmol/L _1 Calcium 8.9 - 10.3 mg/dL 8.2  9.3  14.1   Total Protein 6.5 - 8.1 g/dL  8.3  7.8   Total Bilirubin 0.3 - 1.2 mg/dL  0.4  0.3   Alkaline Phos 38 - 126 U/L  310  241   AST 15 - 41 U/L  76  47   ALT 0 - 44 U/L  47  32     Lab  Results  Component Value Date   WBC 5.1 02/08/2022   HGB 12.6 02/08/2022   HCT 38.8 02/08/2022   MCV 92.2 02/08/2022   PLT 357 02/08/2022   NEUTROABS 4.0 02/06/2022    ASSESSMENT & PLAN:  No problem-specific Assessment & Plan notes found for this encounter.    No orders of the defined types were placed in this encounter.  The patient has a good understanding of the overall plan. she agrees with it. she will call with any problems that may develop before the next visit here. Total time spent: 30 mins including face to face time and time spent for planning, charting and co-ordination of care   Suzzette Righter, Rose 02/09/22    I Gardiner Coins am scribing for Dr. Lindi Adie  ***

## 2022-02-09 NOTE — Telephone Encounter (Signed)
Transition Care Management Unsuccessful Follow-up Telephone Call  Date of discharge and from where:  02/08/2022, [facility]  Attempts:  1st Attempt  Reason for unsuccessful TCM follow-up call:  Left voice message [phone], call back requested   Patient has an appointment at [facility] with Dr Redmond Pulling 02/25/2022.

## 2022-02-10 ENCOUNTER — Telehealth: Payer: Self-pay

## 2022-02-10 ENCOUNTER — Inpatient Hospital Stay: Payer: Medicare Other | Admitting: Hematology and Oncology

## 2022-02-10 NOTE — Assessment & Plan Note (Deleted)
02/19/2015:2.2 x 1.3 x 1.7 cm with architectural distortion and skin/nipple retraction, T2 N0 stage II a clinical stage, grade 2 IDC ER 90%, PR 20%, HER2 negative, Ki-67 15%   11/16/2018:Right mastectomy Ninfa Linden) performed because the tumor was fungating: IDC, grade 1, 5.2cm, grossly involving underlying skin, clear margins. 12/19/18- 01/30/19: Radiation to chest wall   Bone scan 06/26/2019: Stable distribution of bone metastases. CT CAP 04/28/2020: Mixed lytic and sclerotic bone metastases several demonstrating increased lytic character concern for worsening bone mets. 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 We discussed different treatment options including Piqray versus Xeloda.

## 2022-02-10 NOTE — Telephone Encounter (Signed)
-----   Message from Peggyann Shoals sent at 02/10/2022 10:37 AM EST ----- Regarding: hospital fu Contact: [phone] This patient was in the ER on 02/06/2022. She was one of Dr.Codd's patient's on the sign out email. Should she see Dr.Yarbrough or a PA?  PLAN: Admitted to medicine C collar at all times save shower OK for PT/OT/activity Oncology for overall goals and treatment Hospitalist for PE management and heparin gtt (currently on) Ultimately, she may need craniocervical stabilization +/- radiation but tricky and bad candidate with her diffuse bony, calvarial disease and overall tumor burden.  Coller for now seems wise but defer to you, will need to follow closely in case she starts to fail at the craniocervical junction.

## 2022-02-10 NOTE — Telephone Encounter (Signed)
Transition Care Management Unsuccessful Follow-up Telephone Call  Date of discharge and from where:  02/08/2022, [facility]  Attempts:  2nd Attempt  Reason for unsuccessful TCM follow-up call:  Left voice message [phone], call back requested    Patient has an appointment at [facility] with Dr Redmond Pulling 02/25/2022.

## 2022-02-10 NOTE — Telephone Encounter (Signed)
Pt called to make [place] aware she was not able to come to appt today because she did not have transportation. She is scheduled with MD in January. LVM for pt making her aware.

## 2022-02-10 NOTE — Telephone Encounter (Signed)
Left message to call back  

## 2022-02-11 ENCOUNTER — Telehealth: Payer: Self-pay

## 2022-02-11 NOTE — Telephone Encounter (Signed)
Transition Care Management Unsuccessful Follow-up Telephone Call  Date of discharge and from where:   02/08/2022, [facility]    Attempts:  3rd Attempt  Reason for unsuccessful TCM follow-up call:  Left voice message  [phone], call back requested    Patient has an appointment at [facility] with Dr Redmond Pulling 02/25/2022.

## 2022-02-11 NOTE — Telephone Encounter (Signed)
She confirmed appt for 03/02/2022 with xrays

## 2022-02-12 LAB — 25-HYDROXY VITAMIN D LCMS D2+D3
25-Hydroxy, Vitamin D-2: 7.4 ng/mL
25-Hydroxy, Vitamin D-3: 28 ng/mL
25-Hydroxy, Vitamin D: 35 ng/mL

## 2022-02-14 ENCOUNTER — Other Ambulatory Visit: Payer: Self-pay | Admitting: Family Medicine

## 2022-02-14 DIAGNOSIS — E1159 Type 2 diabetes mellitus with other circulatory complications: Secondary | ICD-10-CM

## 2022-02-16 ENCOUNTER — Ambulatory Visit: Payer: Self-pay

## 2022-02-16 NOTE — Telephone Encounter (Signed)
The patient has called contcerned that they have been unable to take their antiinflamatory medication diclofenac (VOLTAREN) 75 MG EC tablet [[id number]] due to their recent prescription of apixaban (ELIQUIS) 5 MG TABS tablet [[id number]]  The patient took a tablet of their diclofenac on Sunday 02/14/22 and experienced stomach pains  The patient has not taken the medication further  The patient was reviewing a list of medications that they're unable to take after researching a list of potential interactions  The patient is concerned that their untaken medications will make their pelvic discomfort return   Chief Complaint: Can't take diclofenac with the Eliquis. Asking what can she take for pain that is not a steroid. Also asking for assistance with transportation to medical appointments. Symptoms: Had abdominal pain Frequency:  Pertinent Negatives: Patient denies  Disposition: '[]'$ ED /'[]'$ Urgent Care (no appt availability in office) / '[]'$ Appointment(In office/virtual)/ '[]'$  [place] Virtual Care/ '[]'$ Home Care/ '[]'$ Refused Recommended Disposition /'[]'$ [place] Mobile [facility]/ '[x]'$  Follow-up with PCP Additional Notes: Please advise pt.  Answer Assessment - Initial Assessment Questions 1. NAME of MEDICINE: "What medicine(s) are you calling about?"     Voltaren caused abdominal pain with the eliquis 2. QUESTION: "What is your question?" (e.g., double dose of medicine, side effect)     Side effect 3. PRESCRIBER: "Who prescribed the medicine?" Reason: if prescribed by specialist, call should be referred to that group.      4. SYMPTOMS: "Do you have any symptoms?" If Yes, ask: "What symptoms are you having?"  "How bad are the symptoms (e.g., mild, moderate, severe)     Caused abdominal pain 5. PREGNANCY:  "Is there any chance that you are pregnant?" "When was your last menstrual period?"     no  Protocols used: Medication Question Call-A-AH

## 2022-02-17 ENCOUNTER — Telehealth: Payer: Self-pay | Admitting: Student in an Organized Health Care Education/Training Program

## 2022-02-17 NOTE — Telephone Encounter (Signed)
I spoke with the patient. She was in the ED on 02/05/2022 an diagnosed with left-sided pulmonary embolus and she was given Eliquis. Since she started the Eliquis her dry cough has come back and she has no appetite.  She does not get sick when eating, she just does not want to eat.  Also, she is scheduled for an ECHO on 03/02/2022 and she wants to know if she will still have to [facility] it because she had one while in the hospital?  This is a Dr. Genia Harold patient. Since he is out of the office Dr. Patsey Berthold will you advise?

## 2022-02-17 NOTE — Telephone Encounter (Signed)
I have attempted without success to contact this patient by phone to return their call and I left a message on answering machine.

## 2022-02-17 NOTE — Telephone Encounter (Signed)
With regards to the echo: The echo was ordered by cardiology and she should touch base with cardiology in this regard.  Feeling assist that she does not need another echo however she should check with them since they were the ones that ordered.  On her CT scan that was done in the hospital there was some fluid noted in the lungs and she may need fluid medication again she should check with cardiology in this regard.  I am not sure what the lack of appetite is due to however she should probably check with her cancer specialist in this regard.

## 2022-02-17 NOTE — Telephone Encounter (Signed)
I notified the patient. Nothing further needed. 

## 2022-02-18 ENCOUNTER — Other Ambulatory Visit: Payer: Self-pay | Admitting: *Deleted

## 2022-02-18 ENCOUNTER — Telehealth: Payer: Self-pay | Admitting: Internal Medicine

## 2022-02-18 ENCOUNTER — Inpatient Hospital Stay: Payer: Medicare Other | Admitting: Licensed Clinical Social Worker

## 2022-02-18 DIAGNOSIS — C7951 Secondary malignant neoplasm of bone: Secondary | ICD-10-CM

## 2022-02-18 NOTE — Telephone Encounter (Signed)
Pt made aware. ECHO cancelled for 1/9

## 2022-02-18 NOTE — Telephone Encounter (Signed)
Patient wants to know if she needs to take another echo if she took one a few weeks ago. Please call back to discuss

## 2022-02-18 NOTE — Progress Notes (Signed)
[place] Work  Clinical Social Work was referred by nurse for assessment of psychosocial needs.  Clinical Social Worker contacted patient by phone  to offer support and assess for needs.    Pt reports need for assistance with transportation since she cannot fully turn anymore, making it unsafe to drive in busy areas. She had friends and family helping over the break, but they are back at work. Pt does have Medicaid and CSW shared information on Medicaid transportation. Pt voiced understanding and will call as this will help with all medical appts. CSW also referring to [facility] transportation coordinator for assistance with appt on 02/24/22 in case medicaid transportation is not set up yet.  Pt is also working on being re-assessed by Medicare in order to get a smaller walker. Otherwise, no needs at this time. CSW encouraged pt to call with any further resource needs.     [place], [place] Worker [employer]

## 2022-02-18 NOTE — Progress Notes (Signed)
Received call from pt with complaint of decreased appetite since starting Eliquis.  Pt state she also wants to change from Eliquis since she is not able to take anti-inflammatories while on Eliquis.  MD out of office this week and pt is scheduled to f/u in office next week.  RN encouraged pt to eat small but frequent meals and address Eliquis concerns with MD during visit.  Pt also requesting to speak with Education officer, museum for community resources.  Referral placed and pt transferred to social worker to discuss in further detail.

## 2022-02-18 NOTE — Telephone Encounter (Signed)
No need to repeat echo.  Follow-up as discussed at office visit.

## 2022-02-18 NOTE — Telephone Encounter (Signed)
Pt recently had ECHO completed at [facility] on 12/17. Will forward to Ignacia Bayley, NP for review.  Nurse informed pt she should not have to repeat ECHO but will forward to NP for confirmation.

## 2022-02-20 NOTE — Progress Notes (Incomplete)
Patient Care Team: Dorna Mai, MD as PCP - General (Family Medicine) [name], MD as PCP - Cardiology (Cardiology) Eppie Gibson, MD as Attending Physician (Radiation Oncology) [name], MD as Consulting Physician (Hematology and Oncology) Loletha Carrow Kirke Corin, MD as Consulting Physician (Gastroenterology) Delice Bison, Charlestine Massed, NP as Nurse Practitioner (Hematology and Oncology) [name], RPH-CPP (Pharmacist) Lazaro Arms, RN as [place] Management  DIAGNOSIS: No diagnosis found.  SUMMARY OF ONCOLOGIC HISTORY: Oncology History  Malignant neoplasm of lower-inner quadrant of right breast of female, estrogen receptor positive ([place])  02/19/2015 Mammogram   Right breast irregular mass lower inner quadrant 2.2 x 1.3 x 1.7 cm with architectural distortion and skin/nipple retraction, T2 N0 stage II a clinical stage, additional benign cysts largest 1.2 cm   02/20/2015 Initial Diagnosis   Right breast biopsy 5:00: Invasive ductal carcinoma grade 2, perineural invasion present, ER 90%, PR 70%, HER-2 negative ratio 0.95, Ki-67 15%    03/07/2015 Breast MRI   right breast inferior subareolar mass measuring 2.2 x 2.4 x 2.5 cm with skin and nipple enhancement, extending inferiorly and laterally is intraductal enhancement over a distance of 4 cm, several level I right axillary lymph nodes with cortical thickening   03/13/2015 -  Anti-estrogen oral therapy   Neoadjuvant antiestrogen therapy with tamoxifen 20 mg daily (because the patient did not want to undergo surgery immediately for multiple family reasons) stopped in late 2017; anastrozole started 05/11/18   05/23/2018 Imaging   CT CAP: 2.9 cm spiculated soft tissue density central right breast, no evidence of soft tissue metastatic disease within the chest abdomen pelvis.  Subcentimeter sclerotic bone lesions T10, left pubis, right posterior ilium Bone scan: Foci of uptake left frontal calvarium, T-spine,  right ilium   10/04/2018 - 02/2021 Anti-estrogen oral therapy    Anastrozole with Delton See started April 2020 Ibrance added 09/29/2018, Faslodex 11/20/2020 discontinued January 2023 (due to progression and toxicity)   11/16/2018 Surgery   Right mastectomy Ninfa Linden): IDC, grade 1, 5.2cm, grossly involving underlying skin, clear margins.   12/07/2018 Cancer Staging   Staging form: Breast, AJCC 7th Edition - Pathologic stage from 12/07/2018: Stage IV (yT4b, NX, M1) - Signed by Eppie Gibson, MD on 12/07/2018   12/18/2018 - 01/30/2019 Radiation Therapy   12/18/2018 through 01/30/2019 Site Technique Total Dose (Gy) Dose per Fx (Gy) Completed Fx Beam Energies  Chest Wall, Right: CW_Rt 3D 50/50 2 25/25 10X, 6X  Chest Wall, Right: CW_Rt_SCV_PAB 3D 50/50 2 25/25 6X, 10X  Chest Wall, Right: CW_Rt_Bst Electron 10/10 2 5/5 6E    05/16/2019 Miscellaneous   Caris molecular testing: ER positive, PI K3 CA mutation present, HER-2 negative, ER positive TMB low BRCA1 not detected, ESR 1 not detected, PD-L1 negative   11/19/2020 - 12/02/2020 Radiation Therapy   11/19/2020 through 12/02/2020 Site Technique Total Dose (Gy) Dose per Fx (Gy) Completed Fx Beam Energies  Thoracic Spine: Spine_ T8-T11 3D 30/30 3 10/10 10X, 15X  Femur Left: Ext_Lt_femur Complex 30/30 3 10/10 10X  Lumbar Spine: Spine_L4-S4 3D 30/30 3 10/10 10X, 15X    04/11/2021 Treatment Plan Change   Verzinio with letrozole along with Xgeva started 04/11/2021   06/16/2021 Imaging   CT chest/abd/pelivs w/contrast  IMPRESSION: 1. No evidence of breast cancer carcinoma progression. 2. New linear interstitial thickening and bronchiectasis in the medial RIGHT lower lobe. Query radiation treatment or aspiration. 3. Multifocal skeletal metastasis involving the pelvis, spine, ribs and shoulder girdles. No interval change.   02/06/2022 -  02/08/2022 Hospital Admission   Admitted at [facility] with cervical spine fracture and multiple subsegmental pulmonary emboli      CHIEF COMPLIANT: Follow up breast cancer surveillance  INTERVAL HISTORY: Emily Wilson is a 56 y.o female with a history of right breast cancer. She presents to the clinic for a follow-up.    ALLERGIES:  is allergic to doxycycline, naproxen, oxycodone-acetaminophen, codeine, hydrocodone-acetaminophen, ibuprofen, lantus [insulin glargine], meloxicam, propoxyphene n-acetaminophen, sulfa antibiotics, sulfonamide derivatives, and tramadol.  MEDICATIONS:  Current Outpatient Medications  Medication Sig Dispense Refill   Accu-Chek FastClix Lancets MISC USE TO TEST BLOOD SUGAR UP TO FOUR TIMES DAILY AS DIRECTED 102 each 5   albuterol (VENTOLIN HFA) 108 (90 Base) MCG/ACT inhaler Inhale 1-2 puffs into the lungs every 6 (six) hours as needed for wheezing or shortness of breath. 8 g 1   apixaban (ELIQUIS) 5 MG TABS tablet Take 2 tablets (10 mg total) by mouth 2 (two) times daily. From 02/15/22 take 5 mg twice a day 60 tablet 3   BD INSULIN SYRINGE U/F 31G X 5/16" 0.5 ML MISC USE TO ADMINISTER INSULIN 3 TIMES DAILY WITH MEALS 100 each 3   cyclobenzaprine (FLEXERIL) 5 MG tablet Take 1 tablet (5 mg total) by mouth at bedtime. Prn muscle pain 30 tablet 1   denosumab (XGEVA) 120 MG/1.7ML SOLN injection Inject 120 mg into the skin every 3 (three) months.     diclofenac (VOLTAREN) 75 MG EC tablet Take 1 tablet (75 mg total) by mouth 2 (two) times daily as needed for mild pain or moderate pain. 60 tablet 3   fluticasone-salmeterol (ADVAIR DISKUS) 250-50 MCG/ACT AEPB Inhale 1 puff into the lungs in the morning and at bedtime. 60 each 2   gabapentin (NEURONTIN) 100 MG capsule Take 3 capsules (300 mg total) by mouth 3 (three) times daily. 90 capsule 1   glucose blood (ACCU-CHEK GUIDE) test strip USE TO CHECK BLOOD SUGAR UP TO 4 TIMES A DAY (E11.65) 200 strip 2   guaiFENesin (ROBITUSSIN) 100 MG/5ML liquid Take 5 mLs by mouth every 4 (four) hours as needed for cough or to loosen phlegm. 120 mL 0   insulin NPH  Human (NOVOLIN N) 100 UNIT/ML injection INJECT 12 UNITS SUBCUTANEOUSLY DAILY 10 mL 3   insulin regular (NOVOLIN R) 100 units/mL injection INJECT 12 UNITS TOTAL INTO THE SKIN 3X DAILY BEFORE MEALS. HOLD DOSE FOR BLOOD SUGAR LESS THAN 125 30 mL 3   Ipratropium-Albuterol (COMBIVENT RESPIMAT) 20-100 MCG/ACT AERS respimat Inhale 1 puff into the lungs every 6 (six) hours. 4 g 1   methocarbamol (ROBAXIN-750) 750 MG tablet Take 1 tablet (750 mg total) by mouth every 8 (eight) hours as needed for muscle spasms. 60 tablet 0   metoprolol succinate (TOPROL-XL) 25 MG 24 hr tablet Take 1 tablet (25 mg total) by mouth daily. 90 tablet 3   ondansetron (ZOFRAN ODT) 8 MG disintegrating tablet Take 1 tablet (8 mg total) by mouth every 8 (eight) hours as needed for nausea or vomiting. 20 tablet 1   umeclidinium bromide (INCRUSE ELLIPTA) 62.5 MCG/ACT AEPB Inhale 1 puff into the lungs daily. 30 each 11   No current facility-administered medications for this visit.    PHYSICAL EXAMINATION: ECOG PERFORMANCE STATUS: {CHL ONC ECOG PS:[phone]}  There were no vitals filed for this visit. There were no vitals filed for this visit.  BREAST:*** No palpable masses or nodules in either right or left breasts. No palpable axillary supraclavicular or infraclavicular adenopathy no breast tenderness or  nipple discharge. (exam performed in the presence of a chaperone)  LABORATORY DATA:  I have reviewed the data as listed    Latest Ref Rng & Units 02/07/2022    7:22 AM 02/06/2022    6:57 PM 01/25/2022    1:54 PM  CMP  Glucose 70 - 99 mg/dL 162  185  244   BUN 6 - 20 mg/dL _0 Creatinine 0.44 - 1.00 mg/dL 0.54  0.77  1.00   Sodium 135 - 145 mmol/L 137  139  137   Potassium 3.5 - 5.1 mmol/L 4.2  4.7  3.8   Chloride 98 - 111 mmol/L 113  106  99   CO2 22 - 32 mmol/L _1 Calcium 8.9 - 10.3 mg/dL 8.2  9.3  14.1   Total Protein 6.5 - 8.1 g/dL  8.3  7.8   Total Bilirubin 0.3 - 1.2 mg/dL  0.4  0.3   Alkaline  Phos 38 - 126 U/L  310  241   AST 15 - 41 U/L  76  47   ALT 0 - 44 U/L  47  32     Lab Results  Component Value Date   WBC 5.1 02/08/2022   HGB 12.6 02/08/2022   HCT 38.8 02/08/2022   MCV 92.2 02/08/2022   PLT 357 02/08/2022   NEUTROABS 4.0 02/06/2022    ASSESSMENT & PLAN:  No problem-specific Assessment & Plan notes found for this encounter.    No orders of the defined types were placed in this encounter.  The patient has a good understanding of the overall plan. she agrees with it. she will call with any problems that may develop before the next visit here. Total time spent: 30 mins including face to face time and time spent for planning, charting and co-ordination of care   Suzzette Righter, [place] 02/20/22    I Gardiner Coins am acting as a Education administrator for [employer]  ***

## 2022-02-23 ENCOUNTER — Encounter: Payer: Self-pay | Admitting: Hematology and Oncology

## 2022-02-24 ENCOUNTER — Inpatient Hospital Stay: Payer: Medicare Other | Admitting: Licensed Clinical Social Worker

## 2022-02-24 ENCOUNTER — Other Ambulatory Visit (HOSPITAL_COMMUNITY): Payer: Self-pay

## 2022-02-24 ENCOUNTER — Inpatient Hospital Stay: Payer: Medicare Other | Attending: Hematology and Oncology

## 2022-02-24 ENCOUNTER — Other Ambulatory Visit: Payer: Self-pay

## 2022-02-24 ENCOUNTER — Inpatient Hospital Stay (HOSPITAL_BASED_OUTPATIENT_CLINIC_OR_DEPARTMENT_OTHER): Payer: Medicare Other | Admitting: Hematology and Oncology

## 2022-02-24 VITALS — BP 123/88 | HR 116 | Temp 97.7°F | Resp 18 | Ht <= 58 in | Wt 136.2 lb

## 2022-02-24 DIAGNOSIS — N823 Fistula of vagina to large intestine: Secondary | ICD-10-CM | POA: Insufficient documentation

## 2022-02-24 DIAGNOSIS — Z170 Estrogen receptor positive status [ER+]: Secondary | ICD-10-CM | POA: Diagnosis not present

## 2022-02-24 DIAGNOSIS — Z9011 Acquired absence of right breast and nipple: Secondary | ICD-10-CM | POA: Insufficient documentation

## 2022-02-24 DIAGNOSIS — C50311 Malignant neoplasm of lower-inner quadrant of right female breast: Secondary | ICD-10-CM

## 2022-02-24 DIAGNOSIS — Z923 Personal history of irradiation: Secondary | ICD-10-CM | POA: Insufficient documentation

## 2022-02-24 DIAGNOSIS — M549 Dorsalgia, unspecified: Secondary | ICD-10-CM | POA: Insufficient documentation

## 2022-02-24 DIAGNOSIS — C7951 Secondary malignant neoplasm of bone: Secondary | ICD-10-CM

## 2022-02-24 DIAGNOSIS — Z79811 Long term (current) use of aromatase inhibitors: Secondary | ICD-10-CM | POA: Insufficient documentation

## 2022-02-24 DIAGNOSIS — Z79899 Other long term (current) drug therapy: Secondary | ICD-10-CM | POA: Diagnosis not present

## 2022-02-24 LAB — CBC WITH DIFFERENTIAL (CANCER CENTER ONLY)
Abs Immature Granulocytes: 0.13 10*3/uL — ABNORMAL HIGH (ref 0.00–0.07)
Basophils Absolute: 0.1 10*3/uL (ref 0.0–0.1)
Basophils Relative: 1 %
Eosinophils Absolute: 0.2 10*3/uL (ref 0.0–0.5)
Eosinophils Relative: 2 %
HCT: 40.6 % (ref 36.0–46.0)
Hemoglobin: 13.9 g/dL (ref 12.0–15.0)
Immature Granulocytes: 2 %
Lymphocytes Relative: 11 %
Lymphs Abs: 0.7 10*3/uL (ref 0.7–4.0)
MCH: 30.8 pg (ref 26.0–34.0)
MCHC: 34.2 g/dL (ref 30.0–36.0)
MCV: 89.8 fL (ref 80.0–100.0)
Monocytes Absolute: 0.7 10*3/uL (ref 0.1–1.0)
Monocytes Relative: 11 %
Neutro Abs: 4.7 10*3/uL (ref 1.7–7.7)
Neutrophils Relative %: 73 %
Platelet Count: 437 10*3/uL — ABNORMAL HIGH (ref 150–400)
RBC: 4.52 MIL/uL (ref 3.87–5.11)
RDW: 14 % (ref 11.5–15.5)
WBC Count: 6.5 10*3/uL (ref 4.0–10.5)
nRBC: 0.8 % — ABNORMAL HIGH (ref 0.0–0.2)

## 2022-02-24 LAB — CMP (CANCER CENTER ONLY)
ALT: 41 U/L (ref 0–44)
AST: 79 U/L — ABNORMAL HIGH (ref 15–41)
Albumin: 3.4 g/dL — ABNORMAL LOW (ref 3.5–5.0)
Alkaline Phosphatase: 364 U/L — ABNORMAL HIGH (ref 38–126)
Anion gap: 12 (ref 5–15)
BUN: 11 mg/dL (ref 6–20)
CO2: 23 mmol/L (ref 22–32)
Calcium: 9.8 mg/dL (ref 8.9–10.3)
Chloride: 98 mmol/L (ref 98–111)
Creatinine: 0.63 mg/dL (ref 0.44–1.00)
GFR, Estimated: >60 mL/min (ref >60)
Glucose, Bld: 209 mg/dL — ABNORMAL HIGH (ref 70–99)
Potassium: 3.9 mmol/L (ref 3.5–5.1)
Sodium: 133 mmol/L — ABNORMAL LOW (ref 135–145)
Total Bilirubin: 0.5 mg/dL (ref 0.3–1.2)
Total Protein: 7.4 g/dL (ref 6.5–8.1)

## 2022-02-24 NOTE — Assessment & Plan Note (Signed)
02/19/2015:2.2 x 1.3 x 1.7 cm with architectural distortion and skin/nipple retraction, T2 N0 stage II a clinical stage, grade 2 IDC ER 90%, PR 20%, HER2 negative, Ki-67 15%   11/16/2018:Right mastectomy Ninfa Linden) performed because the tumor was fungating: IDC, grade 1, 5.2cm, grossly involving underlying skin, clear margins. 12/19/18- 01/30/19: Radiation to chest wall   Bone scan 06/26/2019: Stable distribution of bone metastases. CT CAP 04/28/2020: Mixed lytic and sclerotic bone metastases several demonstrating increased lytic character concern for worsening bone mets. 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  Continue with letrozole I sent a message to Dr. Nadeen Landau to see if her surgery date can be moved up because I am concerned about progression of metastatic breast cancer.  She has 2 subcutaneous nodules that are concerning for progression of metastatic breast cancer.   Dry cough: Patient is seeing pulmonary on 01/27/2022. Return to clinic in January.

## 2022-02-24 NOTE — Progress Notes (Signed)
[place] CSW Progress Note  Holiday representative met with patient per pt/RN request. Pt did not utilize Medicaid or [place] transportation services for today's appt and inquired about gas card. Signed up for and provided [place] card today.    E[place] Rho E Johara Lodwick, LCSW

## 2022-02-25 ENCOUNTER — Other Ambulatory Visit: Payer: Self-pay | Admitting: *Deleted

## 2022-02-25 ENCOUNTER — Encounter: Payer: Self-pay | Admitting: Hematology and Oncology

## 2022-02-25 ENCOUNTER — Ambulatory Visit (INDEPENDENT_AMBULATORY_CARE_PROVIDER_SITE_OTHER): Payer: Medicare Other | Admitting: Family Medicine

## 2022-02-25 VITALS — BP 117/75 | HR 115 | Temp 98.1°F | Resp 16 | Wt 136.0 lb

## 2022-02-25 DIAGNOSIS — C50311 Malignant neoplasm of lower-inner quadrant of right female breast: Secondary | ICD-10-CM

## 2022-02-25 DIAGNOSIS — C7951 Secondary malignant neoplasm of bone: Secondary | ICD-10-CM

## 2022-02-25 DIAGNOSIS — R053 Chronic cough: Secondary | ICD-10-CM

## 2022-02-25 DIAGNOSIS — Z794 Long term (current) use of insulin: Secondary | ICD-10-CM

## 2022-02-25 DIAGNOSIS — E1159 Type 2 diabetes mellitus with other circulatory complications: Secondary | ICD-10-CM

## 2022-02-25 LAB — POCT GLYCOSYLATED HEMOGLOBIN (HGB A1C): Hemoglobin A1C: 9.8 % — AB (ref 4.0–5.6)

## 2022-02-25 MED ORDER — DEXAMETHASONE 0.5 MG PO TABS: 0.5000 mg | ORAL_TABLET | Freq: Every day | ORAL | 1 refills | Status: AC

## 2022-02-25 NOTE — Progress Notes (Signed)
Per MD request referral placed to [facility].

## 2022-02-25 NOTE — Progress Notes (Signed)
Established Patient Office Visit  Subjective    Patient ID: Emily Wilson, female    DOB: [birth year]  Age: 57 y.o. MRN: [id number]   HPI KEILI HASTEN presents for follow up of chronic med issues including diabetes. Patient also reports that her persistent cough was finally noted to be 2/2 PE 2/2 cancer and the coughing became so severe that she actually suffered a cervical fracture because of her bone fragility 2/2 mets.    CC:  Chief Complaint  Patient presents with   Follow-up    HFU/ 3 month f/u    Outpatient Encounter Medications as of 02/25/2022  Medication Sig   Accu-Chek FastClix Lancets MISC USE TO TEST BLOOD SUGAR UP TO FOUR TIMES DAILY AS DIRECTED   albuterol (VENTOLIN HFA) 108 (90 Base) MCG/ACT inhaler Inhale 1-2 puffs into the lungs every 6 (six) hours as needed for wheezing or shortness of breath.   apixaban (ELIQUIS) 5 MG TABS tablet Take 2 tablets (10 mg total) by mouth 2 (two) times daily. From 02/15/22 take 5 mg twice a day   BD INSULIN SYRINGE U/F 31G X 5/16" 0.5 ML MISC USE TO ADMINISTER INSULIN 3 TIMES DAILY WITH MEALS   cyclobenzaprine (FLEXERIL) 5 MG tablet Take 1 tablet (5 mg total) by mouth at bedtime. Prn muscle pain   denosumab (XGEVA) 120 MG/1.7ML SOLN injection Inject 120 mg into the skin every 3 (three) months.   dexamethasone (DECADRON) 0.5 MG tablet Take 1 tablet (0.5 mg total) by mouth daily.   diclofenac (VOLTAREN) 75 MG EC tablet Take 1 tablet (75 mg total) by mouth 2 (two) times daily as needed for mild pain or moderate pain.   fluticasone-salmeterol (ADVAIR DISKUS) 250-50 MCG/ACT AEPB Inhale 1 puff into the lungs in the morning and at bedtime.   gabapentin (NEURONTIN) 100 MG capsule Take 3 capsules (300 mg total) by mouth 3 (three) times daily.   glucose blood (ACCU-CHEK GUIDE) test strip USE TO CHECK BLOOD SUGAR UP TO 4 TIMES A DAY (E11.65)   guaiFENesin (ROBITUSSIN) 100 MG/5ML liquid Take 5 mLs by mouth every 4 (four) hours as needed for  cough or to loosen phlegm.   insulin NPH Human (NOVOLIN N) 100 UNIT/ML injection INJECT 12 UNITS SUBCUTANEOUSLY DAILY   insulin regular (NOVOLIN R) 100 units/mL injection INJECT 12 UNITS TOTAL INTO THE SKIN 3X DAILY BEFORE MEALS. HOLD DOSE FOR BLOOD SUGAR LESS THAN 125   Ipratropium-Albuterol (COMBIVENT RESPIMAT) 20-100 MCG/ACT AERS respimat Inhale 1 puff into the lungs every 6 (six) hours.   methocarbamol (ROBAXIN-750) 750 MG tablet Take 1 tablet (750 mg total) by mouth every 8 (eight) hours as needed for muscle spasms.   metoprolol succinate (TOPROL-XL) 25 MG 24 hr tablet Take 1 tablet (25 mg total) by mouth daily.   ondansetron (ZOFRAN ODT) 8 MG disintegrating tablet Take 1 tablet (8 mg total) by mouth every 8 (eight) hours as needed for nausea or vomiting.   umeclidinium bromide (INCRUSE ELLIPTA) 62.5 MCG/ACT AEPB Inhale 1 puff into the lungs daily.   No facility-administered encounter medications on file as of 02/25/2022.    Past Medical History:  Diagnosis Date   Anxiety state, unspecified    Arthritis    Breast cancer of lower-inner quadrant of right female breast [facility])    Carpal tunnel syndrome    Complication of anesthesia    woke up during ganglion cyst removal in her 20's, not woken up since   Diabetes mellitus without complication ([place])  Type II   Diverticulosis    Heart murmur    History of radiation therapy 12/18/18- 01/30/19   Right Chest wall 25 fractions X 2Gy each to total 50 Gy, followed by a boost 10 Gy in 5 fractions.    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, without mention of thyrotoxic crisis or storm     Past Surgical History:  Procedure Laterality Date   ABDOMINAL HYSTERECTOMY     ANTERIOR CRUCIATE LIGAMENT REPAIR Right 2007   ACL repair   APPENDECTOMY     CARPAL TUNNEL RELEASE Right    GANGLION CYST  EXCISION Right    TOTAL MASTECTOMY Right 11/16/2018   Procedure: RIGHT MASTECTOMY;  Surgeon: Coralie Keens, MD;  Location: [facility];  Service: General;  Laterality: Right;   TUBAL LIGATION      Family History  Problem Relation Age of Onset   Diabetes Mother    Bladder Cancer Mother 70       smoker   Other Mother 78       TAH for unspecified reason   Multiple sclerosis Mother    Kidney failure Mother    Cancer Father 54       lymphatic/tonsil cancer - in remission; former smoker   Diabetes Sister    Diabetes Sister    Heart attack Brother    COPD Maternal Grandmother        smoker   Emphysema Maternal Grandmother        smoker   Diabetes Maternal Grandmother    Stroke Maternal Grandfather    Diabetes Paternal Grandmother    Dementia Maternal Uncle    COPD Maternal Uncle        smoker   Multiple sclerosis Paternal Aunt    Breast cancer Paternal Aunt        dx. late 74s - early 72s    Social History   Socioeconomic History   Marital status: Divorced    Spouse name: Not on file   Number of children: 2   Years of education: Not on file   Highest education level: Not on file  Occupational History   Occupation: Disabled  Tobacco Use   Smoking status: Former    Packs/day: 3.00    Years: 30.00    Total pack years: 90.00    Types: Cigarettes    Quit date: 10/24/2011    Years since quitting: 10.3   Smokeless tobacco: Never  Vaping Use   Vaping Use: Never used  Substance and Sexual Activity   Alcohol use: No   Drug use: Never   Sexual activity: Not Currently  Other Topics Concern   Not on file  Social History Narrative   In [place] w/boyfriend   Works Chiropractor, Scientist, water quality   Social Determinants of Health   Financial Resource Strain: Medium Risk (05/01/2021)   Overall Financial Resource Strain (CARDIA)    Difficulty of Paying Living Expenses: Somewhat hard  Food Insecurity: No Food Insecurity (02/07/2022)   Hunger Vital Sign    Worried About Running Out of Food in  the Last Year: Never true    Ran Out of Food in the Last Year: Never true  Transportation Needs: Unmet Transportation Needs (02/18/2022)   PRAPARE - Hydrologist (Medical): Yes    Lack of Transportation (Non-Medical):  No  Physical Activity: Inactive (05/01/2021)   Exercise Vital Sign    Days of Exercise per Week: 0 days    Minutes of Exercise per Session: 0 min  Stress: Stress Concern Present (05/01/2021)   [place]    Feeling of Stress : Very much  Social Connections: Socially Isolated (05/01/2021)   Social Connection and Isolation Panel [NHANES]    Frequency of Communication with Friends and Family: More than three times a week    Frequency of Social Gatherings with Friends and Family: More than three times a week    Attends Religious Services: Never    Marine scientist or Organizations: No    Attends Archivist Meetings: Never    Marital Status: Divorced  Human resources officer Violence: Not At Risk (02/07/2022)   Humiliation, Afraid, Rape, and Kick questionnaire    Fear of Current or Ex-Partner: No    Emotionally Abused: No    Physically Abused: No    Sexually Abused: No    Review of Systems  Respiratory:  Negative for cough.   All other systems reviewed and are negative.       Objective    BP 117/75   Pulse (!) 115   Temp 98.1 F (36.7 C) (Oral)   Resp 16   Wt 136 lb (61.7 kg)   SpO2 91%   BMI 29.43 kg/m   Physical Exam Vitals and nursing note reviewed.  Constitutional:      General: She is not in acute distress. Neck:     Comments: In neck brace Cardiovascular:     Rate and Rhythm: Normal rate and regular rhythm.  Pulmonary:     Effort: Pulmonary effort is normal. No respiratory distress.     Breath sounds: Normal breath sounds. No wheezing.  Abdominal:     Palpations: Abdomen is soft.     Tenderness: There is no abdominal tenderness.   Musculoskeletal:     Comments: Utilizing crutches  Neurological:     General: No focal deficit present.     Mental Status: She is alert and oriented to person, place, and time.         Assessment & Plan:   1. Type 2 diabetes mellitus with other circulatory complication, with long-term current use of insulin (HCC) Increased A1c and not at goal. Most likely 2/2 steroid treatments. Will monitor - POCT glycosylated hemoglobin (Hb A1C)  2. Persistent cough Improved/resolved/. Now on eliquis 2/2 PE  3. Metastatic cancer to spine [facility]) Management per consultant.    Return in about 3 months (around 05/27/2022) for follow up.   Becky Sax, MD

## 2022-02-25 NOTE — Progress Notes (Signed)
Patient is here for their 3 month follow-up Patient has no concerns today Care gaps have been discussed with patient  

## 2022-02-26 ENCOUNTER — Other Ambulatory Visit (HOSPITAL_COMMUNITY): Payer: Self-pay

## 2022-02-26 ENCOUNTER — Encounter: Payer: Self-pay | Admitting: Family Medicine

## 2022-03-01 ENCOUNTER — Other Ambulatory Visit (HOSPITAL_COMMUNITY): Payer: Self-pay

## 2022-03-01 ENCOUNTER — Other Ambulatory Visit: Payer: Self-pay

## 2022-03-01 ENCOUNTER — Telehealth: Payer: Self-pay | Admitting: *Deleted

## 2022-03-01 DIAGNOSIS — Z8781 Personal history of (healed) traumatic fracture: Secondary | ICD-10-CM

## 2022-03-01 NOTE — Progress Notes (Deleted)
Referring Physician:  Fritzi Mandes, MD [address] [place],  [place] [address]  Primary Physician:  Dorna Mai, MD  History of Present Illness: 03/01/2022 [name] is here today with a chief complaint of *** neck pain after having a severe coughing fit. She was seen in the ER and placed in a cervical collar.    Past Surgery: ***denies  Emily Wilson has ***no symptoms of cervical myelopathy.  The symptoms are causing a significant impact on the patient's life.   I have utilized the care everywhere function in epic to review the outside records available from external health systems.  Review of Systems:  A 10 point review of systems is negative, except for the pertinent positives and negatives detailed in the HPI.  Past Medical History: Past Medical History:  Diagnosis Date   Anxiety state, unspecified    Arthritis    Breast cancer of lower-inner quadrant of right female breast ([facility])    Carpal tunnel syndrome    Complication of anesthesia    woke up during ganglion cyst removal in her 20's, not woken up since   Diabetes mellitus without complication (HCC)    Type II   Diverticulosis    Heart murmur    History of radiation therapy 12/18/18- 01/30/19   Right Chest wall 25 fractions X 2Gy each to total 50 Gy, followed by a boost 10 Gy in 5 fractions.    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, without mention of thyrotoxic crisis or storm     Past Surgical History: Past Surgical History:  Procedure Laterality Date   ABDOMINAL HYSTERECTOMY     ANTERIOR CRUCIATE LIGAMENT REPAIR Right 2007   ACL repair   APPENDECTOMY     CARPAL TUNNEL RELEASE Right    GANGLION CYST EXCISION Right    TOTAL MASTECTOMY Right 11/16/2018   Procedure: RIGHT MASTECTOMY;  Surgeon: Coralie Keens, MD;  Location:  [facility];  Service: General;  Laterality: Right;   TUBAL LIGATION      Allergies: Allergies as of 03/02/2022 - Review Complete 02/26/2022  Allergen Reaction Noted   Doxycycline Shortness Of Breath 12/27/2019   Naproxen Shortness Of Breath    Oxycodone-acetaminophen Itching 07/18/2021   Codeine Hives    Hydrocodone-acetaminophen Hives    Ibuprofen  12/20/2018   Lantus [insulin glargine]  11/08/2018   Meloxicam Other (See Comments) 08/08/2014   Propoxyphene n-acetaminophen Hives    Sulfa antibiotics Hives 05/12/2021   Sulfonamide derivatives Hives    Tramadol Other (See Comments) 02/13/2014    Medications: No outpatient medications have been marked as taking for the 03/02/22 encounter (Appointment) with Meade Maw, MD.    Social History: Social History   Tobacco Use   Smoking status: Former    Packs/day: 3.00    Years: 30.00    Total pack years: 90.00    Types: Cigarettes    Quit date: 10/24/2011    Years since quitting: 10.3   Smokeless tobacco: Never  Vaping Use   Vaping Use: Never used  Substance Use Topics   Alcohol use: No   Drug use: Never    Family Medical History: Family History  Problem Relation Age of Onset   Diabetes Mother    Bladder Cancer Mother 53       smoker  Other Mother 34       TAH for unspecified reason   Multiple sclerosis Mother    Kidney failure Mother    Cancer Father 34       lymphatic/tonsil cancer - in remission; former smoker   Diabetes Sister    Diabetes Sister    Heart attack Brother    COPD Maternal Grandmother        smoker   Emphysema Maternal Grandmother        smoker   Diabetes Maternal Grandmother    Stroke Maternal Grandfather    Diabetes Paternal Grandmother    Dementia Maternal Uncle    COPD Maternal Uncle        smoker   Multiple sclerosis Paternal Aunt    Breast cancer Paternal Aunt        dx. late 15s - early 101s    Physical Examination: There were no vitals filed for this visit.  General: Patient  is well developed, well nourished, calm, collected, and in no apparent distress. Attention to examination is appropriate.  Neck:   Supple.  Full range of motion.  Respiratory: Patient is breathing without any difficulty.   NEUROLOGICAL:     Awake, alert, oriented to person, place, and time.  Speech is clear and fluent.   Cranial Nerves: Pupils equal round and reactive to light.  Facial tone is symmetric.  Facial sensation is symmetric. Shoulder shrug is symmetric. Tongue protrusion is midline.  There is no pronator drift.  ROM of spine: full.    Strength: Side Biceps Triceps Deltoid Interossei Grip Wrist Ext. Wrist Flex.  R '5 5 5 5 5 5 5  '$ L '5 5 5 5 5 5 5   '$ Side Iliopsoas Quads Hamstring PF DF EHL  R '5 5 5 5 5 5  '$ L '5 5 5 5 5 5   '$ Reflexes are ***2+ and symmetric at the biceps, triceps, brachioradialis, patella and achilles.   Hoffman's is absent.   Bilateral upper and lower extremity sensation is intact to light touch.    No evidence of dysmetria noted.  Gait is normal.     Medical Decision Making  Imaging: ***  I have personally reviewed the images and agree with the above interpretation.  Assessment and Plan: Emily Wilson is a pleasant 57 y.o. female with ***    Thank you for involving me in the care of this patient.      Chester K. Izora Ribas MD, [facility] Neurosurgery

## 2022-03-01 NOTE — Telephone Encounter (Signed)
Received call from pt requesting recent CT, MRI, and lab results be mailed to address on file. RN placed documents in outgoing mail.

## 2022-03-02 ENCOUNTER — Inpatient Hospital Stay
Admission: EM | Admit: 2022-03-02 | Discharge: 2022-03-09 | DRG: 193 | Disposition: A | Discharge status: Hospice | Payer: Medicare Other | Attending: Student | Admitting: Student

## 2022-03-02 ENCOUNTER — Other Ambulatory Visit: Payer: Self-pay

## 2022-03-02 ENCOUNTER — Emergency Department: Payer: Medicare Other

## 2022-03-02 ENCOUNTER — Observation Stay: Payer: Medicare Other

## 2022-03-02 ENCOUNTER — Other Ambulatory Visit: Payer: Medicare Other

## 2022-03-02 ENCOUNTER — Ambulatory Visit: Payer: Medicare Other | Admitting: Neurosurgery

## 2022-03-02 DIAGNOSIS — Z87891 Personal history of nicotine dependence: Secondary | ICD-10-CM

## 2022-03-02 DIAGNOSIS — E785 Hyperlipidemia, unspecified: Secondary | ICD-10-CM | POA: Diagnosis present

## 2022-03-02 DIAGNOSIS — M62838 Other muscle spasm: Secondary | ICD-10-CM | POA: Diagnosis present

## 2022-03-02 DIAGNOSIS — E871 Hypo-osmolality and hyponatremia: Secondary | ICD-10-CM | POA: Diagnosis not present

## 2022-03-02 DIAGNOSIS — Z923 Personal history of irradiation: Secondary | ICD-10-CM

## 2022-03-02 DIAGNOSIS — Z825 Family history of asthma and other chronic lower respiratory diseases: Secondary | ICD-10-CM

## 2022-03-02 DIAGNOSIS — E1165 Type 2 diabetes mellitus with hyperglycemia: Secondary | ICD-10-CM | POA: Diagnosis present

## 2022-03-02 DIAGNOSIS — N824 Other female intestinal-genital tract fistulae: Secondary | ICD-10-CM | POA: Diagnosis present

## 2022-03-02 DIAGNOSIS — I2782 Chronic pulmonary embolism: Secondary | ICD-10-CM | POA: Diagnosis present

## 2022-03-02 DIAGNOSIS — N823 Fistula of vagina to large intestine: Secondary | ICD-10-CM | POA: Diagnosis present

## 2022-03-02 DIAGNOSIS — Z833 Family history of diabetes mellitus: Secondary | ICD-10-CM

## 2022-03-02 DIAGNOSIS — J188 Other pneumonia, unspecified organism: Secondary | ICD-10-CM | POA: Diagnosis not present

## 2022-03-02 DIAGNOSIS — B370 Candidal stomatitis: Secondary | ICD-10-CM | POA: Diagnosis not present

## 2022-03-02 DIAGNOSIS — C50311 Malignant neoplasm of lower-inner quadrant of right female breast: Secondary | ICD-10-CM

## 2022-03-02 DIAGNOSIS — Z882 Allergy status to sulfonamides status: Secondary | ICD-10-CM

## 2022-03-02 DIAGNOSIS — E039 Hypothyroidism, unspecified: Secondary | ICD-10-CM | POA: Diagnosis present

## 2022-03-02 DIAGNOSIS — R6510 Systemic inflammatory response syndrome (SIRS) of non-infectious origin without acute organ dysfunction: Secondary | ICD-10-CM

## 2022-03-02 DIAGNOSIS — J9601 Acute respiratory failure with hypoxia: Secondary | ICD-10-CM | POA: Diagnosis present

## 2022-03-02 DIAGNOSIS — Z794 Long term (current) use of insulin: Secondary | ICD-10-CM

## 2022-03-02 DIAGNOSIS — R0789 Other chest pain: Secondary | ICD-10-CM | POA: Insufficient documentation

## 2022-03-02 DIAGNOSIS — Z885 Allergy status to narcotic agent status: Secondary | ICD-10-CM

## 2022-03-02 DIAGNOSIS — M7989 Other specified soft tissue disorders: Secondary | ICD-10-CM | POA: Diagnosis present

## 2022-03-02 DIAGNOSIS — Z1152 Encounter for screening for COVID-19: Secondary | ICD-10-CM

## 2022-03-02 DIAGNOSIS — Z803 Family history of malignant neoplasm of breast: Secondary | ICD-10-CM

## 2022-03-02 DIAGNOSIS — E876 Hypokalemia: Secondary | ICD-10-CM | POA: Diagnosis not present

## 2022-03-02 DIAGNOSIS — Z7901 Long term (current) use of anticoagulants: Secondary | ICD-10-CM

## 2022-03-02 DIAGNOSIS — Z79899 Other long term (current) drug therapy: Secondary | ICD-10-CM

## 2022-03-02 DIAGNOSIS — Z886 Allergy status to analgesic agent status: Secondary | ICD-10-CM

## 2022-03-02 DIAGNOSIS — E119 Type 2 diabetes mellitus without complications: Secondary | ICD-10-CM

## 2022-03-02 DIAGNOSIS — Z170 Estrogen receptor positive status [ER+]: Secondary | ICD-10-CM

## 2022-03-02 DIAGNOSIS — M4854XA Collapsed vertebra, not elsewhere classified, thoracic region, initial encounter for fracture: Secondary | ICD-10-CM | POA: Diagnosis present

## 2022-03-02 DIAGNOSIS — Z881 Allergy status to other antibiotic agents status: Secondary | ICD-10-CM

## 2022-03-02 DIAGNOSIS — I2694 Multiple subsegmental pulmonary emboli without acute cor pulmonale: Secondary | ICD-10-CM | POA: Diagnosis present

## 2022-03-02 DIAGNOSIS — M1990 Unspecified osteoarthritis, unspecified site: Secondary | ICD-10-CM | POA: Diagnosis present

## 2022-03-02 DIAGNOSIS — Z888 Allergy status to other drugs, medicaments and biological substances status: Secondary | ICD-10-CM

## 2022-03-02 DIAGNOSIS — J90 Pleural effusion, not elsewhere classified: Secondary | ICD-10-CM | POA: Diagnosis present

## 2022-03-02 DIAGNOSIS — Z7951 Long term (current) use of inhaled steroids: Secondary | ICD-10-CM

## 2022-03-02 DIAGNOSIS — M4850XA Collapsed vertebra, not elsewhere classified, site unspecified, initial encounter for fracture: Secondary | ICD-10-CM | POA: Diagnosis present

## 2022-03-02 DIAGNOSIS — I10 Essential (primary) hypertension: Secondary | ICD-10-CM | POA: Diagnosis present

## 2022-03-02 DIAGNOSIS — R059 Cough, unspecified: Secondary | ICD-10-CM | POA: Diagnosis present

## 2022-03-02 DIAGNOSIS — F411 Generalized anxiety disorder: Secondary | ICD-10-CM | POA: Diagnosis present

## 2022-03-02 DIAGNOSIS — Z9011 Acquired absence of right breast and nipple: Secondary | ICD-10-CM

## 2022-03-02 DIAGNOSIS — T501X5A Adverse effect of loop [high-ceiling] diuretics, initial encounter: Secondary | ICD-10-CM | POA: Diagnosis present

## 2022-03-02 DIAGNOSIS — Z90710 Acquired absence of both cervix and uterus: Secondary | ICD-10-CM

## 2022-03-02 DIAGNOSIS — K625 Hemorrhage of anus and rectum: Secondary | ICD-10-CM | POA: Diagnosis present

## 2022-03-02 DIAGNOSIS — C7951 Secondary malignant neoplasm of bone: Secondary | ICD-10-CM | POA: Diagnosis present

## 2022-03-02 LAB — COMPREHENSIVE METABOLIC PANEL
ALT: 40 U/L (ref 0–44)
AST: 83 U/L — ABNORMAL HIGH (ref 15–41)
Albumin: 3 g/dL — ABNORMAL LOW (ref 3.5–5.0)
Alkaline Phosphatase: 259 U/L — ABNORMAL HIGH (ref 38–126)
Anion gap: 13 (ref 5–15)
BUN: 9 mg/dL (ref 6–20)
CO2: 22 mmol/L (ref 22–32)
Calcium: 9.2 mg/dL (ref 8.9–10.3)
Chloride: 100 mmol/L (ref 98–111)
Creatinine, Ser: 0.73 mg/dL (ref 0.44–1.00)
GFR, Estimated: >60 mL/min (ref >60)
Glucose, Bld: 240 mg/dL — ABNORMAL HIGH (ref 70–99)
Potassium: 4.2 mmol/L (ref 3.5–5.1)
Sodium: 135 mmol/L (ref 135–145)
Total Bilirubin: 0.8 mg/dL (ref 0.3–1.2)
Total Protein: 7.2 g/dL (ref 6.5–8.1)

## 2022-03-02 LAB — CBC WITH DIFFERENTIAL/PLATELET
Abs Immature Granulocytes: 0.11 10*3/uL — ABNORMAL HIGH (ref 0.00–0.07)
Basophils Absolute: 0.1 10*3/uL (ref 0.0–0.1)
Basophils Relative: 1 %
Eosinophils Absolute: 0.1 10*3/uL (ref 0.0–0.5)
Eosinophils Relative: 2 %
HCT: 39.3 % (ref 36.0–46.0)
Hemoglobin: 12.5 g/dL (ref 12.0–15.0)
Immature Granulocytes: 2 %
Lymphocytes Relative: 11 %
Lymphs Abs: 0.6 10*3/uL — ABNORMAL LOW (ref 0.7–4.0)
MCH: 29.6 pg (ref 26.0–34.0)
MCHC: 31.8 g/dL (ref 30.0–36.0)
MCV: 92.9 fL (ref 80.0–100.0)
Monocytes Absolute: 0.7 10*3/uL (ref 0.1–1.0)
Monocytes Relative: 13 %
Neutro Abs: 3.9 10*3/uL (ref 1.7–7.7)
Neutrophils Relative %: 71 %
Platelets: 484 10*3/uL — ABNORMAL HIGH (ref 150–400)
RBC: 4.23 MIL/uL (ref 3.87–5.11)
RDW: 14.9 % (ref 11.5–15.5)
WBC: 5.5 10*3/uL (ref 4.0–10.5)
nRBC: 0.5 % — ABNORMAL HIGH (ref 0.0–0.2)

## 2022-03-02 LAB — GLUCOSE, CAPILLARY
Glucose-Capillary: 370 mg/dL — ABNORMAL HIGH (ref 70–99)
Glucose-Capillary: 324 mg/dL — ABNORMAL HIGH (ref 70–99)

## 2022-03-02 LAB — TROPONIN I (HIGH SENSITIVITY)
Troponin I (High Sensitivity): 7 ng/L (ref <18)
Troponin I (High Sensitivity): 7 ng/L (ref <18)

## 2022-03-02 LAB — TYPE AND SCREEN
ABO/RH(D): O NEG
Antibody Screen: NEGATIVE

## 2022-03-02 LAB — PROCALCITONIN: Procalcitonin: 0.12 ng/mL

## 2022-03-02 LAB — LACTIC ACID, PLASMA
Lactic Acid, Venous: 2.6 mmol/L (ref 0.5–1.9)
Lactic Acid, Venous: 2.5 mmol/L (ref 0.5–1.9)

## 2022-03-02 LAB — BRAIN NATRIURETIC PEPTIDE: B Natriuretic Peptide: 45.4 pg/mL (ref 0.0–100.0)

## 2022-03-02 MED ORDER — IPRATROPIUM-ALBUTEROL 0.5-2.5 (3) MG/3ML IN SOLN
3.0000 mL | Freq: Once | RESPIRATORY_TRACT | Status: AC
  Administered 2022-03-02: 3 mL via RESPIRATORY_TRACT
  Filled 2022-03-02: qty 3

## 2022-03-02 MED ORDER — ONDANSETRON HCL 4 MG/2ML IJ SOLN: 4.0000 mg | Freq: Four times a day (QID) | INTRAMUSCULAR | Status: AC | PRN

## 2022-03-02 MED ORDER — SODIUM CHLORIDE 0.9 % IV SOLN
500.0000 mg | INTRAVENOUS | Status: AC
  Administered 2022-03-03 – 2022-03-06 (×4): 500 mg via INTRAVENOUS
  Filled 2022-03-02: qty 500
  Filled 2022-03-02: qty 5
  Filled 2022-03-02 (×2): qty 500

## 2022-03-02 MED ORDER — SODIUM CHLORIDE 0.9 % IV SOLN
2.0000 g | Freq: Once | INTRAVENOUS | Status: AC
  Administered 2022-03-02: 2 g via INTRAVENOUS
  Filled 2022-03-02: qty 20

## 2022-03-02 MED ORDER — ACETAMINOPHEN 325 MG PO TABS: 650.0000 mg | ORAL_TABLET | Freq: Four times a day (QID) | ORAL | Status: AC | PRN

## 2022-03-02 MED ORDER — LORAZEPAM 0.5 MG PO TABS: 0.5000 mg | ORAL_TABLET | Freq: Four times a day (QID) | ORAL | Status: DC | PRN

## 2022-03-02 MED ORDER — IPRATROPIUM-ALBUTEROL 0.5-2.5 (3) MG/3ML IN SOLN
3.0000 mL | Freq: Three times a day (TID) | RESPIRATORY_TRACT | Status: AC
  Administered 2022-03-02 – 2022-03-03 (×4): 3 mL via RESPIRATORY_TRACT
  Filled 2022-03-02 (×4): qty 3

## 2022-03-02 MED ORDER — SODIUM CHLORIDE 0.9 % IV SOLN
500.0000 mg | Freq: Once | INTRAVENOUS | Status: AC
  Administered 2022-03-02: 500 mg via INTRAVENOUS
  Filled 2022-03-02: qty 5

## 2022-03-02 MED ORDER — ACETAMINOPHEN 650 MG RE SUPP: 650.0000 mg | Freq: Four times a day (QID) | RECTAL | Status: AC | PRN

## 2022-03-02 MED ORDER — INSULIN ASPART 100 UNIT/ML IJ SOLN
0.0000 [IU] | Freq: Every day | INTRAMUSCULAR | Status: DC
  Administered 2022-03-02: 4 [IU] via SUBCUTANEOUS
  Administered 2022-03-03 – 2022-03-07 (×3): 3 [IU] via SUBCUTANEOUS
  Administered 2022-03-08: 5 [IU] via SUBCUTANEOUS
  Filled 2022-03-02 (×5): qty 1

## 2022-03-02 MED ORDER — GUAIFENESIN ER 600 MG PO TB12: 600.0000 mg | ORAL_TABLET | Freq: Two times a day (BID) | ORAL | Status: AC | PRN

## 2022-03-02 MED ORDER — SODIUM CHLORIDE 0.9 % IV BOLUS
500.0000 mL | Freq: Once | INTRAVENOUS | Status: AC
  Administered 2022-03-02: 500 mL via INTRAVENOUS

## 2022-03-02 MED ORDER — GABAPENTIN 300 MG PO CAPS
300.0000 mg | ORAL_CAPSULE | Freq: Three times a day (TID) | ORAL | Status: DC
  Administered 2022-03-02 – 2022-03-09 (×20): 300 mg via ORAL
  Filled 2022-03-02 (×21): qty 1

## 2022-03-02 MED ORDER — APIXABAN 5 MG PO TABS
5.0000 mg | ORAL_TABLET | Freq: Two times a day (BID) | ORAL | Status: DC
  Administered 2022-03-02 – 2022-03-09 (×15): 5 mg via ORAL
  Filled 2022-03-02 (×15): qty 1

## 2022-03-02 MED ORDER — SENNOSIDES-DOCUSATE SODIUM 8.6-50 MG PO TABS
1.0000 | ORAL_TABLET | Freq: Every evening | ORAL | Status: DC | PRN
  Administered 2022-03-03: 1 via ORAL
  Filled 2022-03-02: qty 1

## 2022-03-02 MED ORDER — HYDROCOD POLI-CHLORPHE POLI ER 10-8 MG/5ML PO SUER: 5.0000 mL | Freq: Every evening | ORAL | Status: AC | PRN

## 2022-03-02 MED ORDER — SODIUM CHLORIDE 0.9 % IV SOLN
2.0000 g | INTRAVENOUS | Status: AC
  Administered 2022-03-03 – 2022-03-06 (×4): 2 g via INTRAVENOUS
  Filled 2022-03-02 (×2): qty 2
  Filled 2022-03-02: qty 20
  Filled 2022-03-02: qty 2

## 2022-03-02 MED ORDER — IPRATROPIUM-ALBUTEROL 0.5-2.5 (3) MG/3ML IN SOLN: 3.0000 mL | Freq: Three times a day (TID) | RESPIRATORY_TRACT | Status: DC

## 2022-03-02 MED ORDER — MOMETASONE FURO-FORMOTEROL FUM 200-5 MCG/ACT IN AERO
2.0000 | INHALATION_SPRAY | Freq: Two times a day (BID) | RESPIRATORY_TRACT | Status: DC
  Administered 2022-03-03 – 2022-03-09 (×13): 2 via RESPIRATORY_TRACT
  Filled 2022-03-02 (×2): qty 8.8

## 2022-03-02 MED ORDER — METHYLPREDNISOLONE SODIUM SUCC 125 MG IJ SOLR
125.0000 mg | Freq: Once | INTRAMUSCULAR | Status: AC
  Administered 2022-03-02: 125 mg via INTRAVENOUS
  Filled 2022-03-02: qty 2

## 2022-03-02 MED ORDER — ONDANSETRON HCL 4 MG PO TABS: 4.0000 mg | ORAL_TABLET | Freq: Four times a day (QID) | ORAL | Status: AC | PRN

## 2022-03-02 MED ORDER — MORPHINE SULFATE (PF) 4 MG/ML IV SOLN: 4.0000 mg | INTRAVENOUS | Status: DC | PRN

## 2022-03-02 MED ORDER — INSULIN ASPART 100 UNIT/ML IJ SOLN
0.0000 [IU] | Freq: Three times a day (TID) | INTRAMUSCULAR | Status: DC
  Administered 2022-03-02 – 2022-03-03 (×2): 15 [IU] via SUBCUTANEOUS
  Administered 2022-03-03: 5 [IU] via SUBCUTANEOUS
  Administered 2022-03-03 – 2022-03-04 (×2): 15 [IU] via SUBCUTANEOUS
  Administered 2022-03-04: 8 [IU] via SUBCUTANEOUS
  Administered 2022-03-05: 2 [IU] via SUBCUTANEOUS
  Administered 2022-03-05 (×2): 5 [IU] via SUBCUTANEOUS
  Administered 2022-03-06: 3 [IU] via SUBCUTANEOUS
  Administered 2022-03-06: 5 [IU] via SUBCUTANEOUS
  Administered 2022-03-06: 11 [IU] via SUBCUTANEOUS
  Administered 2022-03-07: 5 [IU] via SUBCUTANEOUS
  Administered 2022-03-07: 15 [IU] via SUBCUTANEOUS
  Administered 2022-03-07: 3 [IU] via SUBCUTANEOUS
  Administered 2022-03-08: 5 [IU] via SUBCUTANEOUS
  Administered 2022-03-08: 3 [IU] via SUBCUTANEOUS
  Administered 2022-03-08: 8 [IU] via SUBCUTANEOUS
  Administered 2022-03-09: 3 [IU] via SUBCUTANEOUS
  Administered 2022-03-09: 11 [IU] via SUBCUTANEOUS
  Filled 2022-03-02 (×20): qty 1

## 2022-03-02 MED ORDER — HYDRALAZINE HCL 20 MG/ML IJ SOLN: 5.0000 mg | Freq: Three times a day (TID) | INTRAMUSCULAR | Status: AC | PRN

## 2022-03-02 MED ORDER — METHYLPREDNISOLONE SODIUM SUCC 40 MG IJ SOLR
40.0000 mg | Freq: Two times a day (BID) | INTRAMUSCULAR | Status: AC
  Administered 2022-03-03 (×2): 40 mg via INTRAVENOUS
  Filled 2022-03-02 (×2): qty 1

## 2022-03-02 MED ORDER — APIXABAN 5 MG PO TABS: 5.0000 mg | ORAL_TABLET | Freq: Two times a day (BID) | ORAL | Status: DC

## 2022-03-02 MED ORDER — METOPROLOL SUCCINATE ER 25 MG PO TB24
25.0000 mg | ORAL_TABLET | Freq: Every day | ORAL | Status: DC
  Administered 2022-03-02 – 2022-03-09 (×8): 25 mg via ORAL
  Filled 2022-03-02 (×8): qty 1

## 2022-03-02 MED ORDER — LIDOCAINE 5 % EX PTCH: 1.0000 | MEDICATED_PATCH | Freq: Every day | CUTANEOUS | Status: DC | PRN

## 2022-03-02 MED ORDER — IOHEXOL 350 MG/ML SOLN
100.0000 mL | Freq: Once | INTRAVENOUS | Status: AC | PRN
  Administered 2022-03-02: 100 mL via INTRAVENOUS

## 2022-03-02 MED ORDER — INSULIN GLARGINE-YFGN 100 UNIT/ML ~~LOC~~ SOLN
12.0000 [IU] | Freq: Every day | SUBCUTANEOUS | Status: DC
  Administered 2022-03-02 – 2022-03-09 (×8): 12 [IU] via SUBCUTANEOUS
  Filled 2022-03-02 (×8): qty 0.12

## 2022-03-02 NOTE — Assessment & Plan Note (Signed)
-   Patient endorses that she missed several Eliquis dosing over the last couple of weeks and she has experienced worsening left lower extremity swelling - Ultrasound of the lower extremity to assess for DVT ordered

## 2022-03-02 NOTE — Assessment & Plan Note (Signed)
-   Resumed home metoprolol succinate 25 mg daily - Hydralazine 5 mg IV every 8 hours as needed for SBP greater than 175, 4 days ordered

## 2022-03-02 NOTE — Hospital Course (Signed)
Ms. Elke Holtry is a 57 year old female with history of diverticulosis, metastatic breast cancer status postmastectomy, hyperlipidemia, hypothyroid, hypertension, insulin-dependent diabetes mellitus, anxiety, who presents to the emergency department for chief concerns of shortness of breath.  Initial vitals in the emergency department showed temperature of 98, respiration rate of 17, heart rate of 120, blood pressure 107/85, SpO2 of 97% on 2 L nasal cannula.  Serum sodium is 135, potassium 4.2, chloride 100, bicarb 22, BUN of 9, serum creatinine of 0.73, nonfasting blood glucose 240, EGFR greater than 60, WBC 5.5, hemoglobin 12.5, platelets of 484.  High sensitive troponin is 7.  BNP is 45.4.  Lactic acid is 2.6.  ED treatment: DuoNebs one-time dose, sodium chloride 500 mL bolus, azithromycin 500 mg IV, ceftriaxone 2 g IV, Solu-Medrol 125 mg IV.

## 2022-03-02 NOTE — Assessment & Plan Note (Addendum)
-   Resumed home Eliquis 5 mg p.o. twice daily

## 2022-03-02 NOTE — ED Notes (Signed)
First Nurse Note: Patient to ED via [facility] from home for SOB/cough for the past 3 days. Hx of DVT's. Patient placed on O2 by EMS for comfort.  96% RA 138/82 116 HR 256 cbg

## 2022-03-02 NOTE — ED Provider Notes (Signed)
[facility] Provider Note    Event Date/Time   First MD Initiated Contact with Patient 03/02/22 1001     (approximate)   History   Rectal Bleeding   HPI  Emily Wilson is a 57 y.o. female here with multiple complaints.  Patient's primary complaint is acute on chronic shortness of breath.  The patient states that over the last several weeks she has had fairly chronic, constant, aching, throbbing, cough.  She has had a neck fracture related to this due to pathological lesions and C1 and C2.  She states that over the last 24 to 48 hours, this cough is worsened and she began to produce more sputum.  She has had some associated dyspnea, even at rest.  She had some mild chest pain after coughing.  She also states that she has developed some lower abdominal fullness and had bright red blood when she wiped today after the toilet.  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  She denies any overt ongoing abdominal pain.     Physical Exam   Triage Vital Signs: ED Triage Vitals  Enc Vitals Group     BP 03/02/22 0735 107/85     Pulse Rate 03/02/22 0735 (!) 120     Resp 03/02/22 0735 17     Temp 03/02/22 0735 98 F (36.7 C)     Temp Source 03/02/22 0735 Oral     SpO2 03/02/22 0735 97 %     Weight --      Height --      Head Circumference --      Peak Flow --      Pain Score 03/02/22 0736 6     Pain Loc --      Pain Edu? --      Excl. in [place]? --     Most recent vital signs: Vitals:   03/02/22 1228 03/02/22 1418  BP: 128/75 130/83  Pulse: (!) 110 (!) 110  Resp: 20 20  Temp:  98.4 F (36.9 C)  SpO2: 98% 98%     General: Awake, no distress.  CV:  Good peripheral perfusion.  Regular rate and rhythm. Resp:  Mild tachypnea noted with diffuse wheezing.  Diminished aeration, particularly in the bases. Abd:  No distention.  Mild fullness and tenderness in the lower abdomen bilaterally. Other:  Cervical collar in place.   ED  Results / Procedures / Treatments   Labs (all labs ordered are listed, but only abnormal results are displayed) Labs Reviewed  CBC WITH DIFFERENTIAL/PLATELET - Abnormal; Notable for the following components:      Result Value   Platelets 484 (*)    nRBC 0.5 (*)    Lymphs Abs 0.6 (*)    Abs Immature Granulocytes 0.11 (*)    All other components within normal limits  COMPREHENSIVE METABOLIC PANEL - Abnormal; Notable for the following components:   Glucose, Bld 240 (*)    Albumin 3.0 (*)    AST 83 (*)    Alkaline Phosphatase 259 (*)    All other components within normal limits  LACTIC ACID, PLASMA - Abnormal; Notable for the following components:   Lactic Acid, Venous 2.6 (*)    All other components within normal limits  LACTIC ACID, PLASMA - Abnormal; Notable for the following components:   Lactic Acid, Venous 2.5 (*)    All other components within normal limits  CULTURE, BLOOD (SINGLE)  CULTURE, BLOOD (SINGLE)  RESPIRATORY PANEL  BY PCR  SARS CORONAVIRUS 2 BY RT PCR  RESP PANEL BY RT-PCR (RSV, FLU A&B, COVID)  RVPGX2  BRAIN NATRIURETIC PEPTIDE  PROCALCITONIN  URINALYSIS, COMPLETE (UACMP) WITH MICROSCOPIC  TYPE AND SCREEN  TROPONIN I (HIGH SENSITIVITY)  TROPONIN I (HIGH SENSITIVITY)     EKG Sinus tachycardia, ventricular rate 111.  PR 140, QRS 66, QTc 454.  No acute ST elevations or depressions.  No EKG evidence of acute ischemia or infarct.   RADIOLOGY CT angio chest: No evidence of PE, enlarging small to moderate bilateral pleural effusions with compressive atelectasis, questionable pulmonary edema, widespread metastatic disease CT abdomen/pelvis: Diverticulosis without evidence of acute diverticulitis, no obstruction Chest x-ray: Diffuse interstitial opacities   I also independently reviewed and agree with radiologist interpretations.   PROCEDURES:  Critical Care performed: No   MEDICATIONS ORDERED IN ED: Medications  acetaminophen (TYLENOL) tablet 650 mg  (has no administration in time range)    Or  acetaminophen (TYLENOL) suppository 650 mg (has no administration in time range)  ondansetron (ZOFRAN) tablet 4 mg (has no administration in time range)    Or  ondansetron (ZOFRAN) injection 4 mg (has no administration in time range)  senna-docusate (Senokot-S) tablet 1 tablet (has no administration in time range)  methylPREDNISolone sodium succinate (SOLU-MEDROL) 40 mg/mL injection 40 mg (has no administration in time range)  ipratropium-albuterol (DUONEB) 0.5-2.5 (3) MG/3ML nebulizer solution 3 mL (has no administration in time range)  insulin aspart (novoLOG) injection 0-15 Units (has no administration in time range)  insulin aspart (novoLOG) injection 0-5 Units (has no administration in time range)  LORazepam (ATIVAN) tablet 0.5 mg (has no administration in time range)  metoprolol succinate (TOPROL-XL) 24 hr tablet 25 mg (has no administration in time range)  gabapentin (NEURONTIN) capsule 300 mg (has no administration in time range)  mometasone-formoterol (DULERA) 200-5 MCG/ACT inhaler 2 puff (has no administration in time range)  apixaban (ELIQUIS) tablet 5 mg (has no administration in time range)  hydrALAZINE (APRESOLINE) injection 5 mg (has no administration in time range)  insulin glargine-yfgn (SEMGLEE) injection 12 Units (has no administration in time range)  chlorpheniramine-HYDROcodone (TUSSIONEX) 10-8 MG/5ML suspension 5 mL (has no administration in time range)  azithromycin (ZITHROMAX) 500 mg in sodium chloride 0.9 % 250 mL IVPB (has no administration in time range)  cefTRIAXone (ROCEPHIN) 2 g in sodium chloride 0.9 % 100 mL IVPB (has no administration in time range)  guaiFENesin (MUCINEX) 12 hr tablet 600 mg (has no administration in time range)  lidocaine (LIDODERM) 5 % 1 patch (has no administration in time range)  morphine (PF) 4 MG/ML injection 4 mg (has no administration in time range)  ipratropium-albuterol (DUONEB) 0.5-2.5  (3) MG/3ML nebulizer solution 3 mL (3 mLs Nebulization Given 03/02/22 1142)  sodium chloride 0.9 % bolus 500 mL (0 mLs Intravenous Stopped 03/02/22 1232)  iohexol (OMNIPAQUE) 350 MG/ML injection 100 mL (100 mLs Intravenous Contrast Given 03/02/22 1114)  ipratropium-albuterol (DUONEB) 0.5-2.5 (3) MG/3ML nebulizer solution 3 mL (3 mLs Nebulization Given 03/02/22 1315)  methylPREDNISolone sodium succinate (SOLU-MEDROL) 125 mg/2 mL injection 125 mg (125 mg Intravenous Given 03/02/22 1325)  cefTRIAXone (ROCEPHIN) 2 g in sodium chloride 0.9 % 100 mL IVPB (0 g Intravenous Stopped 03/02/22 1410)  azithromycin (ZITHROMAX) 500 mg in sodium chloride 0.9 % 250 mL IVPB (0 mg Intravenous Stopped 03/02/22 1539)  sodium chloride 0.9 % bolus 500 mL (500 mLs Intravenous New Bag/Given 03/02/22 1419)     IMPRESSION / MDM / ASSESSMENT AND PLAN /  ED COURSE  I reviewed the triage vital signs and the nursing notes.                              Differential diagnosis includes, but is not limited to, COPD exacerbation, pneumonia, COVID-19, PE, ACS, metabolic acidosis, diverticulitis, mesenteric ischemia  Patient's presentation is most consistent with acute presentation with potential threat to life or bodily function.  The patient is on the cardiac monitor to evaluate for evidence of arrhythmia and/or significant heart rate changes.  57 year old female with history of diverticulosis, metastatic breast cancer, hyperlipidemia, hypertension, diabetes, here with shortness of breath and bright red blood per rectum.  Regarding her shortness of breath, suspect possible COPD/emphysema as she has been diagnosed with this in the past although unclear if she has this, versus CHF, versus atypical infection.  She does have new hypoxia here.  CT angio obtained given her history of recent PE and shows no evidence of worsening PE.  She does have bilateral pleural effusions with compressive atelectasis which could certainly be causing her symptoms.  No  known history of malignant effusion.  CT of the abdomen pelvis shows no acute intra-abdominal pathology.  CBC shows stable hemoglobin.  CMP is unremarkable/at baseline.  BNP is normal.  Troponin negative.  Lactic acid elevated 2.6, suspect this is multifactorial secondary to her respiratory distress as well as liver mets.  Procalcitonin is pending.  Will treat with steroids and breathing treatments for possible underlying COPD, admit to medicine.  Given her sputum production and consolidation on CT, but this could be atelectasis, will treat with antibiotics.  She may ultimately benefit from workup of her effusions.  Patient in agreement this plan.  Regarding her bright red blood on toilet paper, there is no evidence of active GI bleed at this time and I suspect this was possibly just due to mucosal irritation on anticoagulation.     FINAL CLINICAL IMPRESSION(S) / ED DIAGNOSES   Final diagnoses:  Acute respiratory failure with hypoxemia (HCC)  Pleural effusion     Rx / DC Orders   ED Discharge Orders     None        Note:  This document was prepared using Dragon voice recognition software and may include unintentional dictation errors.   Duffy Bruce, MD 03/02/22 [phone]

## 2022-03-02 NOTE — Assessment & Plan Note (Addendum)
-   Per patient this chest pain has been ongoing since November since she started coughing - I suspect this is secondary to musculoskeletal strain in setting of frequent and persistent cough - Tussionex nightly as needed for cough ordered to help patient get rest - Lidocaine patch daily as needed for MSK pain

## 2022-03-02 NOTE — ED Notes (Signed)
Type and screen hemolyzed - will redraw.

## 2022-03-02 NOTE — Assessment & Plan Note (Addendum)
-   Etiology workup in progress - Differentials include atypical pneumonia  - Continue ceftriaxone 2 g IV daily and azithromycin 500 mg IV daily, to complete 5-day course - Flutter valve and incentive spirometry

## 2022-03-02 NOTE — H&P (Signed)
History and Physical   Emily Wilson IWP:[id number] DOB: 1966/02/03 DOA: 03/02/2022  PCP: Dorna Mai, MD  Outpatient Specialists: Dr. Lindi Adie, medical oncology Patient coming from: home via EMS  I have personally briefly reviewed patient's old medical records in [place].  Chief Concern: shortness of breath  HPI: Emily Wilson is a 57 year old female with history of diverticulosis, metastatic breast cancer status postmastectomy, hyperlipidemia, hypothyroid, hypertension, insulin-dependent diabetes mellitus, anxiety, who presents to the emergency department for chief concerns of shortness of breath.  Initial vitals in the emergency department showed temperature of 98, respiration rate of 17, heart rate of 120, blood pressure 107/85, SpO2 of 97% on 2 L nasal cannula.  Serum sodium is 135, potassium 4.2, chloride 100, bicarb 22, BUN of 9, serum creatinine of 0.73, nonfasting blood glucose 240, EGFR greater than 60, WBC 5.5, hemoglobin 12.5, platelets of 484.  High sensitive troponin is 7.  BNP is 45.4.  Lactic acid is 2.6.  ED treatment: DuoNebs one-time dose, sodium chloride 500 mL bolus, azithromycin 500 mg IV, ceftriaxone 2 g IV, Solu-Medrol 125 mg IV. ----------------------------- At bedside, she is able to tell me her name, age, current year.  She reports shortness of breath that has been ongoing since December 2023.   She reports the difficulty breathing since November 2023. She reports the shortness of breath that worsened since last night.   She denies fever, chills. She endorses chest pain and shortness of breath. She reports sternal spasms that radiates to the underside of her right breast. She reports chest pain that is also substernal and persistent since November 2023. She reports that resting makes it better.   She endorses nausea and denies vomiting. She endorses dysuria, that started about two days ago.   Social history: She lives alone and she has been  staying with her boyfriend since being sick. She is a former tobacco user, at her peak, she smoked 3 packs per day. She quit about 15 years ago. She denies etoh use.   ROS: Constitutional: no weight change, no fever ENT/Mouth: no sore throat, no rhinorrhea Eyes: no eye pain, no vision changes Cardiovascular: no chest pain, + dyspnea,  no edema, no palpitations Respiratory: no cough, no sputum, no wheezing Gastrointestinal: no nausea, no vomiting, no diarrhea, no constipation Genitourinary: no urinary incontinence, no dysuria, no hematuria Musculoskeletal: no arthralgias, no myalgias Skin: no skin lesions, no pruritus, Neuro: + weakness, no loss of consciousness, no syncope Psych: no anxiety, no depression, + decrease appetite Heme/Lymph: no bruising, no bleeding  ED Course: Discussed with emergency medicine provider, patient requiring hospitalization for chief concerns of acute hypoxic respiratory failure.  Assessment/Plan  Principal Problem:   Acute hypoxemic respiratory failure (HCC) Active Problems:   SIRS (systemic inflammatory response syndrome) (HCC)   Anxiety state   Hypertension   Malignant neoplasm of lower-inner quadrant of right breast of female, estrogen receptor positive ([place])   DM (diabetes mellitus), type 2 (HCC)   HLD (hyperlipidemia)   S/P mastectomy, right   Colovaginal fistula   Pathologic compression fracture of vertebra (HCC)   Multiple subsegmental pulmonary emboli without acute cor pulmonale (HCC)   Metastatic cancer to spine [facility])   Musculoskeletal chest pain   Leg swelling   Assessment and Plan:  * Acute hypoxemic respiratory failure ([place]) - Etiology workup in progress - Differentials include atypical pneumonia  - Continue ceftriaxone 2 g IV daily and azithromycin 500 mg IV daily, to complete 5-day course - Flutter valve and  incentive spirometry  SIRS (systemic inflammatory response syndrome) (HCC) - Etiology workup in progress at this time,  differentials include pneumonia versus UTI in setting of colovaginal fistula - 1 set of blood cultures have been ordered by EDP and is currently in process  - Ordered additional blood culture set - Ordered UA  - Continue follow-up with procalcitonin - 20 pathogen respiratory panel ordered  Leg swelling - Patient endorses that she missed several Eliquis dosing over the last couple of weeks and she has experienced worsening left lower extremity swelling - Ultrasound of the lower extremity to assess for DVT ordered  Musculoskeletal chest pain - Per patient this chest pain has been ongoing since November since she started coughing - I suspect this is secondary to musculoskeletal strain in setting of frequent and persistent cough - Tussionex nightly as needed for cough ordered to help patient get rest - Lidocaine patch daily as needed for MSK pain  Metastatic cancer to spine (HCC) - Morphine 4 mg IV every 4 hours as needed for severe pain, 3 doses  Multiple subsegmental pulmonary emboli without acute cor pulmonale (HCC) - Resumed home Eliquis 5 mg p.o. twice daily  Pathologic compression fracture of vertebra ([place]) - Continue patient follow-up with medical oncology as appropriate  Colovaginal fistula - Continue outpatient follow-up as appropriate  DM (diabetes mellitus), type 2 ([place]) - Patient's NPH of 12 units has been resumed with equivalent  semeglee 12 units daily - Insulin SSI with at bedtime coverage ordered - Goal inpatient blood glucose levels 140-180  Hypertension - Resumed home metoprolol succinate 25 mg daily - Hydralazine 5 mg IV every 8 hours as needed for SBP greater than 175, 4 days ordered  Anxiety state - Lorazepam 0.5 mg p.o. every 6 hours as needed for anxiety, 3 doses ordered  Chart reviewed.   DVT prophylaxis: Eliquis 5 mg BID Code Status: full code Diet: heart healthy/carb modified Family Communication: Kirk Ruths (son) and Butch Penny (boyfriend) were in the room,  with patient's permission. Disposition Plan: pending clinical course Consults called: none at this time Admission status: telemetry medical, observation  Past Medical History:  Diagnosis Date   Anxiety state, unspecified    Arthritis    Breast cancer of lower-inner quadrant of right female breast ([facility])    Carpal tunnel syndrome    Complication of anesthesia    woke up during ganglion cyst removal in her 20's, not woken up since   Diabetes mellitus without complication ([place])    Type II   Diverticulosis    Heart murmur    History of radiation therapy 12/18/18- 01/30/19   Right Chest wall 25 fractions X 2Gy each to total 50 Gy, followed by a boost 10 Gy in 5 fractions.    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 Her smoking use included cigarettes. She has a 90.00 pack-year smoking history. She has never used smokeless tobacco. She reports that she does not drink alcohol and does not use drugs.  Allergies  Allergen Reactions   Doxycycline Shortness Of Breath    Wheezing, shortness of breath, rash head to toe, and swelling   Naproxen Shortness Of Breath   Oxycodone-Acetaminophen Itching   Codeine Hives   Hydrocodone-Acetaminophen Hives   Ibuprofen     Irritant to stomach r/t diverticulitis   Lantus [Insulin Glargine]      Yeast Infections   Meloxicam Other (See Comments)    Abdominal pain    Propoxyphene N-Acetaminophen Hives   Sulfa Antibiotics Hives   Sulfonamide Derivatives Hives   Tramadol Other (See Comments)    Abdominal Pain   Family History  Problem Relation Age of Onset   Diabetes Mother    Bladder Cancer Mother 57       smoker   Other Mother 22       TAH for unspecified reason   Multiple sclerosis Mother    Kidney failure Mother    Cancer Father 1       lymphatic/tonsil cancer - in remission; former smoker   Diabetes Sister    Diabetes Sister    Heart attack Brother    COPD Maternal Grandmother        smoker   Emphysema Maternal Grandmother        smoker   Diabetes Maternal Grandmother    Stroke Maternal Grandfather    Diabetes Paternal Grandmother    Dementia Maternal Uncle    COPD Maternal Uncle        smoker   Multiple sclerosis Paternal Aunt    Breast cancer Paternal Aunt        dx. late 58s - early 65s   Family history: Family history reviewed and not pertinent.  Prior to Admission medications   Medication Sig Start Date End Date Taking? Authorizing Provider  albuterol (VENTOLIN HFA) 108 (90 Base) MCG/ACT inhaler Inhale 1-2 puffs into the lungs every 6 (six) hours as needed for wheezing or shortness of breath. 11/10/21  Yes Nyoka Lint, PA-C  apixaban (ELIQUIS) 5 MG TABS tablet Take 2 tablets (10 mg total) by mouth 2 (two) times daily. From 02/15/22 take 5 mg twice a day 02/08/22  Yes Fritzi Mandes, MD  fluticasone-salmeterol (ADVAIR DISKUS) 250-50 MCG/ACT AEPB Inhale 1 puff into the lungs in the morning and at bedtime. 01/13/22  Yes Argentina Donovan, PA-C  gabapentin (NEURONTIN) 100 MG capsule Take 3 capsules (300 mg total) by mouth 3 (three) times daily. 02/06/19  Yes [name], MD  insulin NPH Human (NOVOLIN N) 100 UNIT/ML injection INJECT 12 UNITS SUBCUTANEOUSLY DAILY 02/16/22  Yes Dorna Mai, MD  insulin regular (NOVOLIN R) 100 units/mL injection INJECT 12  UNITS TOTAL INTO THE SKIN 3X DAILY BEFORE MEALS. HOLD DOSE FOR BLOOD SUGAR LESS THAN 125 11/02/21  Yes Dorna Mai, MD  Ipratropium-Albuterol (COMBIVENT RESPIMAT) 20-100 MCG/ACT AERS respimat Inhale 1 puff into the lungs every 6 (six) hours. 11/10/21  Yes Nyoka Lint, PA-C  metoprolol succinate (TOPROL-XL) 25 MG 24 hr tablet Take 1 tablet (25 mg total) by mouth daily. 04/14/21  Yes [name], MD  nitrofurantoin, macrocrystal-monohydrate, (MACROBID) 100 MG capsule Take 100 mg by mouth daily. 02/21/22  Yes [provider]  Accu-Chek FastClix Lancets MISC USE TO TEST BLOOD SUGAR UP TO FOUR TIMES  DAILY AS DIRECTED 11/02/21   Dorna Mai, MD  BD INSULIN SYRINGE U/F 31G X 5/16" 0.5 ML MISC USE TO ADMINISTER INSULIN 3 TIMES DAILY WITH MEALS 09/07/21   Dorna Mai, MD  cyclobenzaprine (FLEXERIL) 5 MG tablet Take 1 tablet (5 mg total) by mouth at bedtime. Prn muscle pain Patient not taking: Reported on 03/02/2022 01/13/22   Argentina Donovan, PA-C  denosumab (XGEVA) 120 MG/1.7ML SOLN injection Inject 120 mg into the skin every 3 (three) months. Patient not taking: Reported on 03/02/2022    [provider]  dexamethasone (DECADRON) 0.5 MG tablet Take 1 tablet (0.5 mg total) by mouth daily. 02/25/22   [name], MD  diclofenac (VOLTAREN) 75 MG EC tablet Take 1 tablet (75 mg total) by mouth 2 (two) times daily as needed for mild pain or moderate pain. Patient not taking: Reported on 03/02/2022 11/23/21   [name], MD  glucose blood (ACCU-CHEK GUIDE) test strip USE TO CHECK BLOOD SUGAR UP TO 4 TIMES A DAY (E11.65) 07/31/21   Dorna Mai, MD  guaiFENesin (ROBITUSSIN) 100 MG/5ML liquid Take 5 mLs by mouth every 4 (four) hours as needed for cough or to loosen phlegm. Patient not taking: Reported on 03/02/2022 02/08/22   Fritzi Mandes, MD  methocarbamol (ROBAXIN-750) 750 MG tablet Take 1 tablet (750 mg total) by mouth every 8 (eight) hours as needed for muscle spasms. Patient not taking:  Reported on 03/02/2022 02/03/22   [name], MD  ondansetron (ZOFRAN ODT) 8 MG disintegrating tablet Take 1 tablet (8 mg total) by mouth every 8 (eight) hours as needed for nausea or vomiting. Patient not taking: Reported on 03/02/2022 04/20/21   [name], MD  rosuvastatin (CRESTOR) 40 MG tablet Take 40 mg by mouth daily. Patient not taking: Reported on 03/02/2022 02/27/22   [provider]  umeclidinium bromide (INCRUSE ELLIPTA) 62.5 MCG/ACT AEPB Inhale 1 puff into the lungs daily. Patient not taking: Reported on 03/02/2022 01/27/22 01/27/23  Armando Reichert, MD   Physical Exam: Vitals:   03/02/22 0735 03/02/22 1057 03/02/22 1228 03/02/22 1418  BP: 107/85 (!) 144/82 128/75 130/83  Pulse: (!) 120 (!) 112 (!) 110 (!) 110  Resp: '17 20 20 20  '$ Temp: 98 F (36.7 C) 97.7 F (36.5 C)  98.4 F (36.9 C)  TempSrc: Oral Oral  Oral  SpO2: 97% 98% 98% 98%   Constitutional: appears frail and older than chronological age, NAD, calm Eyes: PERRL, lids and conjunctivae normal ENMT: Mucous membranes are moist. Posterior pharynx clear of any exudate or lesions. Age-appropriate dentition. Hearing appropriate.  Nasal cannula in place  Neck: normal, supple, no masses, no thyromegaly Respiratory: Diffuse generalized on auscultation, no crackles. Normal respiratory effort. No accessory muscle use.  Cardiovascular: Regular rate and rhythm, no murmurs / rubs / gallops. Bilateral lower extremity edema, L > R. 2+ pedal pulses. No carotid bruits.  Abdomen: no tenderness, no masses palpated, no hepatosplenomegaly. Bowel sounds positive.  Musculoskeletal: no clubbing / cyanosis. No joint deformity upper and lower extremities. Good ROM, no contractures, no atrophy. Normal muscle tone.  Skin: no rashes, lesions, ulcers. No induration Neurologic: Sensation intact. Strength 5/5 in all 4.  Psychiatric: Normal judgment and insight. Alert and oriented x 3. Normal mood.   EKG: independently reviewed, showing sinus  tachycardia with rate of 111, QTc 454  Chest x-ray on Admission: I personally reviewed and I agree with radiologist reading as below.  CT Angio Chest PE W and/or Wo Contrast  Result Date: 03/02/2022  CLINICAL DATA:  Pulmonary embolism (PE) suspected, high prob; Abdominal pain, acute, nonlocalized. Metastatic breast cancer EXAM: CT ANGIOGRAPHY CHEST CT ABDOMEN AND PELVIS WITH CONTRAST TECHNIQUE: Multidetector CT imaging of the chest was performed using the standard protocol during bolus administration of intravenous contrast. Multiplanar CT image reconstructions and MIPs were obtained to evaluate the vascular anatomy. Multidetector CT imaging of the abdomen and pelvis was performed using the standard protocol during bolus administration of intravenous contrast. RADIATION DOSE REDUCTION: This exam was performed according to the departmental dose-optimization program which includes automated exposure control, adjustment of the mA and/or kV according to patient size and/or use of iterative reconstruction technique. CONTRAST:  150m OMNIPAQUE IOHEXOL 350 MG/ML SOLN COMPARISON:  CT 02/05/2022 FINDINGS: CTA CHEST FINDINGS Cardiovascular: Satisfactory opacification of the pulmonary arteries to the segmental branch level. 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 No new pulmonary arterial filling defects to suggest acute PE. No right heart strain. Thoracic aorta is nonaneurysmal. Scattered atherosclerotic vascular calcifications of the aorta and coronary arteries. Normal heart size. No pericardial effusion. Mediastinum/Nodes: Similar degree of soft tissue density throughout the mediastinum which may represent post radiation changes. No overt adenopathy. Thyroid, trachea, and esophagus demonstrate no significant findings. Lungs/Pleura: Enlarging small to moderate bilateral pleural effusions with associated compressive  atelectasis. Diffuse interlobular septal thickening and ground-glass attenuation throughout the lung fields. No new or enlarging pulmonary nodules are identified within the limitations of this exam. 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 Numerous bilateral pathologic rib fractures. Progressive height loss of a pathologic fracture involving the superior endplate of TO75 New nondisplaced pathologic fracture of the T4 spinous process. Review of the MIP images confirms the above findings. CT ABDOMEN and PELVIS FINDINGS Hepatobiliary: Extensive hepatic metastatic disease involving both hepatic lobes, similar in degree to the previous CT. Unremarkable gallbladder. No hyperdense gallstone. No biliary dilatation. Pancreas: Unremarkable. No pancreatic ductal dilatation or surrounding inflammatory changes. Spleen: Normal in size without focal abnormality. Adrenals/Urinary Tract: 2.0 cm left adrenal nodule, similar to prior. Unremarkable kidneys. No stone or hydronephrosis. Urinary bladder within normal limits. Stomach/Bowel: Stomach is within normal limits. Sigmoid diverticulosis. No evidence of bowel wall thickening, distention, or inflammatory changes. Vascular/Lymphatic: Aortic atherosclerosis. No enlarged abdominal or pelvic lymph nodes. Reproductive: Status post hysterectomy. No adnexal masses. Other: No free fluid. No abdominopelvic fluid collection. No pneumoperitoneum. No abdominal wall hernia. 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 Slightly progressive vertebral body height loss of known L1 pathologic fracture. New pathologic fracture of the mid left iliac crest (series 5, image 43). Lytic lesion with extraosseous soft tissue component at the right hemi sacrum is similar to prior. Review of the MIP images confirms the above findings. IMPRESSION: 1. No evidence of acute pulmonary  embolism. 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 2. Enlarging small to moderate bilateral pleural effusions with associated compressive atelectasis. Diffuse interlobular septal thickening and ground-glass attenuation throughout the lung fields, suggestive of pulmonary edema. 3. 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 Progressive height loss of a known pathologic fractures of T12 and L1. New nondisplaced pathologic fracture of the T4 spinous process. New pathologic fracture of the mid left iliac crest. 4. Extensive hepatic metastatic disease, similar in degree to the previous CT. 5.  Stable 2.0 cm left adrenal nodule, suspicious for metastatic involvement. 6. Sigmoid diverticulosis without evidence of acute diverticulitis. 7. Aortic and coronary artery atherosclerosis (ICD10-I70.0). Electronically Signed   By: Davina Poke D.O.   On: 03/02/2022 11:45   CT ABDOMEN PELVIS W CONTRAST  Result Date: 03/02/2022 CLINICAL DATA:  Pulmonary embolism (PE) suspected, high prob; Abdominal pain, acute, nonlocalized. Metastatic breast cancer EXAM: CT ANGIOGRAPHY CHEST CT ABDOMEN AND PELVIS WITH CONTRAST TECHNIQUE: Multidetector CT imaging of the chest was performed using the standard protocol during bolus administration of intravenous contrast. Multiplanar CT image reconstructions and MIPs were obtained to evaluate the vascular anatomy. Multidetector CT imaging of the abdomen and pelvis was performed using the standard protocol during bolus administration of intravenous contrast. RADIATION DOSE REDUCTION: This exam was performed according to the departmental dose-optimization program which includes automated exposure control, adjustment of the mA and/or kV according to patient size and/or use of iterative reconstruction technique. CONTRAST:   157m OMNIPAQUE IOHEXOL 350 MG/ML SOLN COMPARISON:  CT 02/05/2022 FINDINGS: CTA CHEST FINDINGS Cardiovascular: Satisfactory opacification of the pulmonary arteries to the segmental branch level. 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 No new pulmonary arterial filling defects to suggest acute PE. No right heart strain. Thoracic aorta is nonaneurysmal. Scattered atherosclerotic vascular calcifications of the aorta and coronary arteries. Normal heart size. No pericardial effusion. Mediastinum/Nodes: Similar degree of soft tissue density throughout the mediastinum which may represent post radiation changes. No overt adenopathy. Thyroid, trachea, and esophagus demonstrate no significant findings. Lungs/Pleura: Enlarging small to moderate bilateral pleural effusions with associated compressive atelectasis. Diffuse interlobular septal thickening and ground-glass attenuation throughout the lung fields. No new or enlarging pulmonary nodules are identified within the limitations of this exam. 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 Numerous bilateral pathologic rib fractures. Progressive height loss of a pathologic fracture involving the superior endplate of TK27 New nondisplaced pathologic fracture of the T4 spinous process. Review of the MIP images confirms the above findings. CT ABDOMEN and PELVIS FINDINGS Hepatobiliary: Extensive hepatic metastatic disease involving both hepatic lobes, similar in degree to the previous CT. Unremarkable gallbladder. No hyperdense gallstone. No biliary dilatation. Pancreas: Unremarkable. No pancreatic ductal dilatation or surrounding inflammatory changes. Spleen: Normal in size without focal abnormality. Adrenals/Urinary Tract: 2.0 cm left adrenal nodule, similar to prior. Unremarkable  kidneys. No stone or hydronephrosis. Urinary bladder within normal limits. Stomach/Bowel: Stomach is within normal limits. Sigmoid diverticulosis. No evidence of bowel wall thickening, distention, or inflammatory changes. Vascular/Lymphatic: Aortic atherosclerosis. No enlarged abdominal or pelvic lymph nodes. Reproductive: Status post hysterectomy. No adnexal masses. Other: No free fluid. No abdominopelvic fluid collection. No pneumoperitoneum. No abdominal wall hernia. 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 Slightly progressive vertebral body height loss of known L1 pathologic fracture. New pathologic fracture of the mid left iliac crest (series 5, image 43). Lytic lesion with extraosseous soft tissue component at the right hemi sacrum is similar to prior. Review of the MIP images confirms the above findings. IMPRESSION: 1. No evidence of acute pulmonary embolism. 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 2. Enlarging small to moderate bilateral pleural effusions with associated compressive atelectasis. Diffuse interlobular septal thickening and ground-glass attenuation throughout the lung fields, suggestive of pulmonary edema. 3. Widespread mixed lytic and sclerotic osseous metastatic disease  of the visualized axial and appendicular skeleton. Progressive height loss of a known pathologic fractures of T12 and L1. New nondisplaced pathologic fracture of the T4 spinous process. New pathologic fracture of the mid left iliac crest. 4. Extensive hepatic metastatic disease, similar in degree to the previous CT. 5. Stable 2.0 cm left adrenal nodule, suspicious for metastatic involvement. 6. Sigmoid diverticulosis without evidence of acute diverticulitis. 7. Aortic and coronary artery atherosclerosis (ICD10-I70.0). Electronically Signed   By: Davina Poke D.O.   On:  03/02/2022 11:45   DG Chest 2 View  Result Date: 03/02/2022 CLINICAL DATA:  Shortness of breath and cough EXAM: CHEST - 2 VIEW COMPARISON:  Chest x-ray dated December 28, 2021 FINDINGS: Cardiac and mediastinal contours are within normal limits. Diffuse interstitial opacities with fissural nodularity increased when compared with the prior radiograph. Trace left-greater-than-right pleural effusions. Osseous metastatic disease with bilateral pathologic rib fractures. No evidence of pneumothorax. IMPRESSION: 1. Diffuse interstitial opacities with fissural nodularity, possibly due to pulmonary edema, although fissural nodularity is concerning for component of lymphangitic spread of tumor. 2. Trace left-greater-than-right pleural effusions. 3. Osseous metastatic disease with bilateral pathologic rib fractures. Electronically Signed   By: Yetta Glassman M.D.   On: 03/02/2022 08:06    Labs on Admission: I have personally reviewed following labs  CBC: Recent Labs  Lab 02/24/22 1104 03/02/22 0736  WBC 6.5 5.5  NEUTROABS 4.7 3.9  HGB 13.9 12.5  HCT 40.6 39.3  MCV 89.8 92.9  PLT 437* 465*   Basic Metabolic Panel: Recent Labs  Lab 02/24/22 1104 03/02/22 0736  NA 133* 135  K 3.9 4.2  CL 98 100  CO2 23 22  GLUCOSE 209* 240*  BUN 11 9  CREATININE 0.63 0.73  CALCIUM 9.8 9.2   GFR: Estimated Creatinine Clearance: 59.3 mL/min (by C-G formula based on SCr of 0.73 mg/dL).  Liver Function Tests: Recent Labs  Lab 02/24/22 1104 03/02/22 0736  AST 79* 83*  ALT 41 40  ALKPHOS 364* 259*  BILITOT 0.5 0.8  PROT 7.4 7.2  ALBUMIN 3.4* 3.0*   Urine analysis:    Component Value Date/Time   COLORURINE YELLOW 11/16/2021 1530   APPEARANCEUR CLEAR 11/16/2021 1530   APPEARANCEUR Clear 08/20/2019 1447   LABSPEC 1.022 11/16/2021 1530   PHURINE 5.0 11/16/2021 1530   GLUCOSEU >=500 (A) 11/16/2021 1530   HGBUR NEGATIVE 11/16/2021 1530   BILIRUBINUR NEGATIVE 11/16/2021 1530   BILIRUBINUR negative  09/29/2021 1337   BILIRUBINUR Negative 08/20/2019 1447   KETONESUR NEGATIVE 11/16/2021 1530   PROTEINUR NEGATIVE 11/16/2021 1530   UROBILINOGEN 0.2 09/29/2021 1337   UROBILINOGEN 0.2 02/28/2018 1743   NITRITE NEGATIVE 11/16/2021 1530   LEUKOCYTESUR SMALL (A) 11/16/2021 1530   This document was prepared using Dragon Voice Recognition software and may include unintentional dictation errors.  Dr. Tobie Poet [facility]  If 7PM-7AM, please contact overnight-coverage provider If 7AM-7PM, please contact day coverage provider [email or web address]  03/02/2022, 3:50 PM

## 2022-03-02 NOTE — ED Provider Triage Note (Signed)
Emergency Medicine Provider Triage Evaluation Note  Emily Wilson , a 57 y.o. female  was evaluated in triage.  Pt  multiple complains.  Complains of shortness of breath and was recently hospitalized for the same and diagnosed with a PE per patient.  She also reports that she is taking Eliquis and has had some bright red rectal bleeding.  Review of Systems  Positive: + rectal bleeding, SOB, fx cervical spine, COPD Negative: No known fever  Physical Exam  BP 107/85 (BP Location: Right Arm)   Pulse (!) 120   Temp 98 F (36.7 C) (Oral)   Resp 17   SpO2 97%  Gen:   Awake, no distress  Talking in complete sentences, has O2 on 2/L per nasal cannula Resp:  Normal effort, clear bilaterally at present MSK:   Moves extremities without difficulty  Other:    Medical Decision Making  Medically screening exam initiated at 7:40 AM.  Appropriate orders placed.  [name] was informed that the remainder of the evaluation will be completed by another provider, this initial triage assessment does not replace that evaluation, and the importance of remaining in the ED until their evaluation is complete.     Johnn Hai, PA-C 03/02/22 [id number]

## 2022-03-02 NOTE — Assessment & Plan Note (Signed)
-   Continue patient follow-up with medical oncology as appropriate

## 2022-03-02 NOTE — Assessment & Plan Note (Signed)
-   Continue outpatient follow-up as appropriate

## 2022-03-02 NOTE — ED Triage Notes (Addendum)
Pt presents to ED with cough, SOB and rectal bleeding, pt states she is on Eliquis, pt states bright red blood and a clot. Pt states she is on Eliquis due to a PE.   Pt has multiple complains at this time.

## 2022-03-02 NOTE — Assessment & Plan Note (Signed)
-   Morphine 4 mg IV every 4 hours as needed for severe pain, 3 doses

## 2022-03-02 NOTE — Assessment & Plan Note (Signed)
-   Lorazepam 0.5 mg p.o. every 6 hours as needed for anxiety, 3 doses ordered

## 2022-03-02 NOTE — ED Notes (Signed)
Pt reports improvement in breathing after duoneb.

## 2022-03-02 NOTE — Assessment & Plan Note (Signed)
-   Patient's NPH of 12 units has been resumed with equivalent  semeglee 12 units daily - Insulin SSI with at bedtime coverage ordered - Goal inpatient blood glucose levels 140-180

## 2022-03-02 NOTE — Assessment & Plan Note (Addendum)
-   Etiology workup in progress at this time, differentials include pneumonia versus UTI in setting of colovaginal fistula - 1 set of blood cultures have been ordered by EDP and is currently in process  - Ordered additional blood culture set - Ordered UA  - Continue follow-up with procalcitonin - 20 pathogen respiratory panel ordered

## 2022-03-03 ENCOUNTER — Observation Stay: Payer: Medicare Other

## 2022-03-03 DIAGNOSIS — I10 Essential (primary) hypertension: Secondary | ICD-10-CM | POA: Diagnosis present

## 2022-03-03 DIAGNOSIS — Z794 Long term (current) use of insulin: Secondary | ICD-10-CM | POA: Diagnosis not present

## 2022-03-03 DIAGNOSIS — E039 Hypothyroidism, unspecified: Secondary | ICD-10-CM | POA: Diagnosis present

## 2022-03-03 DIAGNOSIS — Z9011 Acquired absence of right breast and nipple: Secondary | ICD-10-CM | POA: Diagnosis not present

## 2022-03-03 DIAGNOSIS — M7989 Other specified soft tissue disorders: Secondary | ICD-10-CM | POA: Diagnosis present

## 2022-03-03 DIAGNOSIS — R6510 Systemic inflammatory response syndrome (SIRS) of non-infectious origin without acute organ dysfunction: Secondary | ICD-10-CM | POA: Diagnosis not present

## 2022-03-03 DIAGNOSIS — M62838 Other muscle spasm: Secondary | ICD-10-CM | POA: Diagnosis present

## 2022-03-03 DIAGNOSIS — N824 Other female intestinal-genital tract fistulae: Secondary | ICD-10-CM | POA: Diagnosis not present

## 2022-03-03 DIAGNOSIS — E785 Hyperlipidemia, unspecified: Secondary | ICD-10-CM | POA: Diagnosis present

## 2022-03-03 DIAGNOSIS — I2782 Chronic pulmonary embolism: Secondary | ICD-10-CM | POA: Diagnosis present

## 2022-03-03 DIAGNOSIS — Z1152 Encounter for screening for COVID-19: Secondary | ICD-10-CM | POA: Diagnosis not present

## 2022-03-03 DIAGNOSIS — C7951 Secondary malignant neoplasm of bone: Secondary | ICD-10-CM | POA: Diagnosis present

## 2022-03-03 DIAGNOSIS — E871 Hypo-osmolality and hyponatremia: Secondary | ICD-10-CM | POA: Diagnosis not present

## 2022-03-03 DIAGNOSIS — F411 Generalized anxiety disorder: Secondary | ICD-10-CM | POA: Diagnosis present

## 2022-03-03 DIAGNOSIS — K625 Hemorrhage of anus and rectum: Secondary | ICD-10-CM | POA: Diagnosis present

## 2022-03-03 DIAGNOSIS — J9601 Acute respiratory failure with hypoxia: Secondary | ICD-10-CM | POA: Diagnosis present

## 2022-03-03 DIAGNOSIS — B370 Candidal stomatitis: Secondary | ICD-10-CM | POA: Diagnosis not present

## 2022-03-03 DIAGNOSIS — M4854XA Collapsed vertebra, not elsewhere classified, thoracic region, initial encounter for fracture: Secondary | ICD-10-CM | POA: Diagnosis present

## 2022-03-03 DIAGNOSIS — Z923 Personal history of irradiation: Secondary | ICD-10-CM | POA: Diagnosis not present

## 2022-03-03 DIAGNOSIS — E876 Hypokalemia: Secondary | ICD-10-CM | POA: Diagnosis not present

## 2022-03-03 DIAGNOSIS — R059 Cough, unspecified: Secondary | ICD-10-CM | POA: Diagnosis present

## 2022-03-03 DIAGNOSIS — J188 Other pneumonia, unspecified organism: Secondary | ICD-10-CM | POA: Diagnosis present

## 2022-03-03 DIAGNOSIS — Z515 Encounter for palliative care: Secondary | ICD-10-CM | POA: Diagnosis not present

## 2022-03-03 DIAGNOSIS — Z7189 Other specified counseling: Secondary | ICD-10-CM | POA: Diagnosis not present

## 2022-03-03 DIAGNOSIS — N823 Fistula of vagina to large intestine: Secondary | ICD-10-CM | POA: Diagnosis present

## 2022-03-03 DIAGNOSIS — M1990 Unspecified osteoarthritis, unspecified site: Secondary | ICD-10-CM | POA: Diagnosis present

## 2022-03-03 DIAGNOSIS — E1165 Type 2 diabetes mellitus with hyperglycemia: Secondary | ICD-10-CM | POA: Diagnosis present

## 2022-03-03 DIAGNOSIS — J90 Pleural effusion, not elsewhere classified: Secondary | ICD-10-CM | POA: Diagnosis present

## 2022-03-03 LAB — RESPIRATORY PANEL BY PCR

## 2022-03-03 LAB — GLUCOSE, CAPILLARY
Glucose-Capillary: 249 mg/dL — ABNORMAL HIGH (ref 70–99)
Glucose-Capillary: 396 mg/dL — ABNORMAL HIGH (ref 70–99)
Glucose-Capillary: 351 mg/dL — ABNORMAL HIGH (ref 70–99)
Glucose-Capillary: 366 mg/dL — ABNORMAL HIGH (ref 70–99)
Glucose-Capillary: 296 mg/dL — ABNORMAL HIGH (ref 70–99)

## 2022-03-03 LAB — CBC
HCT: 37.1 % (ref 36.0–46.0)
Hemoglobin: 11.8 g/dL — ABNORMAL LOW (ref 12.0–15.0)
MCH: 29.7 pg (ref 26.0–34.0)
MCHC: 31.8 g/dL (ref 30.0–36.0)
MCV: 93.5 fL (ref 80.0–100.0)
Platelets: 467 10*3/uL — ABNORMAL HIGH (ref 150–400)
RBC: 3.97 MIL/uL (ref 3.87–5.11)
RDW: 14.9 % (ref 11.5–15.5)
WBC: 7.4 10*3/uL (ref 4.0–10.5)
nRBC: 0.3 % — ABNORMAL HIGH (ref 0.0–0.2)

## 2022-03-03 LAB — BASIC METABOLIC PANEL
Anion gap: 10 (ref 5–15)
BUN: 9 mg/dL (ref 6–20)
CO2: 22 mmol/L (ref 22–32)
Calcium: 8.8 mg/dL — ABNORMAL LOW (ref 8.9–10.3)
Chloride: 104 mmol/L (ref 98–111)
Creatinine, Ser: 0.58 mg/dL (ref 0.44–1.00)
GFR, Estimated: >60 mL/min (ref >60)
Glucose, Bld: 258 mg/dL — ABNORMAL HIGH (ref 70–99)
Potassium: 4.6 mmol/L (ref 3.5–5.1)
Sodium: 136 mmol/L (ref 135–145)

## 2022-03-03 LAB — URINALYSIS, COMPLETE (UACMP) WITH MICROSCOPIC
Bacteria, UA: NONE SEEN
Bilirubin Urine: NEGATIVE
Glucose, UA: >=500 mg/dL — AB
Hgb urine dipstick: NEGATIVE
Ketones, ur: NEGATIVE mg/dL
Leukocytes,Ua: NEGATIVE
Nitrite: NEGATIVE
Protein, ur: NEGATIVE mg/dL
Specific Gravity, Urine: 1.02 (ref 1.005–1.030)
pH: 6 (ref 5.0–8.0)

## 2022-03-03 LAB — PROCALCITONIN: Procalcitonin: <0.1 ng/mL

## 2022-03-03 MED ORDER — FLUCONAZOLE 100 MG PO TABS
100.0000 mg | ORAL_TABLET | Freq: Every day | ORAL | Status: AC
  Administered 2022-03-03 – 2022-03-09 (×7): 100 mg via ORAL
  Filled 2022-03-03 (×7): qty 1

## 2022-03-03 MED ORDER — GLUCERNA SHAKE PO LIQD
237.0000 mL | Freq: Three times a day (TID) | ORAL | Status: DC
  Administered 2022-03-03 – 2022-03-09 (×11): 237 mL via ORAL

## 2022-03-03 MED ORDER — MENTHOL 3 MG MT LOZG: 1.0000 | LOZENGE | OROMUCOSAL | Status: DC | PRN

## 2022-03-03 MED ORDER — ADULT MULTIVITAMIN W/MINERALS CH
1.0000 | ORAL_TABLET | Freq: Every day | ORAL | Status: DC
  Administered 2022-03-03 – 2022-03-05 (×3): 1 via ORAL
  Filled 2022-03-03 (×6): qty 1

## 2022-03-03 MED ORDER — BENZONATATE 100 MG PO CAPS
100.0000 mg | ORAL_CAPSULE | Freq: Three times a day (TID) | ORAL | Status: DC | PRN
  Administered 2022-03-08: 100 mg via ORAL
  Filled 2022-03-03: qty 1

## 2022-03-03 NOTE — Progress Notes (Signed)
[facility] Progress Note  Patient: Emily Wilson    YKD:[id number]  DOA: 03/02/2022     Date of Service: the patient was seen and examined on 03/03/2022  Chief Complaint  Patient presents with   Rectal Bleeding   Brief hospital course: Emily Wilson is a 57 year old female with history of diverticulosis, metastatic breast cancer status postmastectomy, hyperlipidemia, hypothyroid, hypertension, insulin-dependent diabetes mellitus, anxiety, who presents to the emergency department for chief concerns of shortness of breath.   Initial vitals in the emergency department showed temperature of 98, respiration rate of 17, heart rate of 120, blood pressure 107/85, SpO2 of 97% on 2 L nasal cannula. Serum sodium is 135, potassium 4.2, chloride 100, bicarb 22, BUN of 9, serum creatinine of 0.73, nonfasting blood glucose 240, EGFR greater than 60, WBC 5.5, hemoglobin 12.5, platelets of 484. High sensitive troponin is 7.  BNP is 45.4.  Lactic acid is 2.6. ED treatment: DuoNebs one-time dose, sodium chloride 500 mL bolus, azithromycin 500 mg IV, ceftriaxone 2 g IV, Solu-Medrol 125 mg IV.    Assessment and Plan: Principal Problem:   Acute hypoxemic respiratory failure (HCC) Active Problems:   SIRS (systemic inflammatory response syndrome) (HCC)   Anxiety state   Hypertension   Malignant neoplasm of lower-inner quadrant of right breast of female, estrogen receptor positive (HCC)   DM (diabetes mellitus), type 2 (HCC)   HLD (hyperlipidemia)   S/P mastectomy, right   Colovaginal fistula   Pathologic compression fracture of vertebra (HCC)   Multiple subsegmental pulmonary emboli without acute cor pulmonale (HCC)   Metastatic cancer to spine [facility])   Musculoskeletal chest pain   Leg swelling Assessment and Plan:   * Acute hypoxemic respiratory failure (HCC) - Etiology workup in progress - Differentials include atypical pneumonia  - Continue ceftriaxone 2 g IV daily and azithromycin  500 mg IV daily, to complete 5-day course - Flutter valve and incentive spirometry    Bilateral pleural effusion could be due to malignancy CTA negative for acute PE, showed bilateral pleural effusion right greater than left IR consulted for thoracentesis Follow fluid studies   SIRS (systemic inflammatory response syndrome) (HCC) - Etiology workup in progress at this time, differentials include pneumonia versus UTI in setting of colovaginal fistula - 1 set of blood cultures have been ordered by EDP and is currently in process  - Ordered additional blood culture set - Ordered UA  - Continue follow-up with procalcitonin - 20 pathogen respiratory panel ordered   Leg swelling Mild swelling left lower extremity not very significant Venous duplex negative for DVT    Musculoskeletal chest pain - Per patient this chest pain has been ongoing since November since she started coughing - I suspect this is secondary to musculoskeletal strain in setting of frequent and persistent cough - Tussionex nightly as needed for cough ordered to help patient get rest - Lidocaine patch daily as needed for MSK pain   Metastatic cancer to spine (HCC) - Morphine 4 mg IV every 4 hours as needed for severe pain, 3 doses   Multiple subsegmental pulmonary emboli without acute cor pulmonale  - Resumed home Eliquis 5 mg p.o. twice daily   Pathologic compression fracture of vertebra ([place]) - Continue patient follow-up with medical oncology as appropriate   Colovaginal fistula - Continue outpatient follow-up as appropriate   DM (diabetes mellitus), type 2 - Patient's NPH of 12 units has been resumed with equivalent  semeglee 12 units daily - Insulin SSI with at  bedtime coverage ordered - Goal inpatient blood glucose levels 140-180   Hypertension - Resumed home metoprolol succinate 25 mg daily - Hydralazine 5 mg IV every 8 hours as needed for SBP greater than 175, 4 days ordered   Anxiety state -  Lorazepam 0.5 mg p.o. every 6 hours as needed for anxiety, 3 doses ordered   Body mass index is 27.34 kg/m.  Interventions:       Diet: Heart healthy/carb modified DVT Prophylaxis: Therapeutic Anticoagulation with Eliquis    Advance goals of care discussion: Full code  Family Communication: family was not present at bedside, at the time of interview.  The pt provided permission to discuss medical plan with the family. Opportunity was given to ask question and all questions were answered satisfactorily.   Disposition:  Pt is from Home, admitted with respiratory failure, bilateral pleural effusion, needs thoracentesis and still on oxygen, which precludes a safe discharge. Discharge to home vs hospice, when clinically stable.  Subjective: No significant overnight events, patient is still having persistent shortness of breath and dry cough, which is chronic but shortness of breath is worsening gradually.  Denies any chest pain, no palpitations, no any other complaints.  Physical Exam: General: NAD, lying comfortably, mild SOB, coughing Appear in no distress, affect appropriate Eyes: PERRLA ENT: Oral Mucosa Clear, moist  Neck: no JVD,  Cardiovascular: S1 and S2 Present, no Murmur,  Respiratory: good respiratory effort, Bilateral Air entry equal and Decreased, mild bibasilar crackles crackles, no wheezes Abdomen: Bowel Sound present, Soft and no tenderness,  Skin: no rashes Extremities: no Pedal edema, no calf tenderness Neurologic: without any new focal findings Gait not checked due to patient safety concerns  Vitals:   03/03/22 0436 03/03/22 0729 03/03/22 0809 03/03/22 1142  BP: 133/75  (!) 133/101 (!) 139/92  Pulse: 87  (!) 101 100  Resp: '17  18 18  '$ Temp: 97.7 F (36.5 C)  97.6 F (36.4 C) 98.1 F (36.7 C)  TempSrc: Oral  Oral Oral  SpO2: 93% 91% 97% 94%  Weight:      Height:        Intake/Output Summary (Last 24 hours) at 03/03/2022 1213 Last data filed at 03/03/2022  5400 Gross per 24 hour  Intake 2228.63 ml  Output 2400 ml  Net -171.37 ml   Filed Weights   03/02/22 1735  Weight: 61.4 kg    Data Reviewed: I have personally reviewed and interpreted daily labs, tele strips, imagings as discussed above. I reviewed all nursing notes, pharmacy notes, vitals, pertinent old records I have discussed plan of care as described above with RN and patient/family.  CBC: Recent Labs  Lab 03/02/22 0736 03/03/22 0500  WBC 5.5 7.4  NEUTROABS 3.9  --   HGB 12.5 11.8*  HCT 39.3 37.1  MCV 92.9 93.5  PLT 484* 867*   Basic Metabolic Panel: Recent Labs  Lab 03/02/22 0736 03/03/22 0500  NA 135 136  K 4.2 4.6  CL 100 104  CO2 22 22  GLUCOSE 240* 258*  BUN 9 9  CREATININE 0.73 0.58  CALCIUM 9.2 8.8*    Studies: US Venous Img Lower Bilateral (DVT)  Result Date: 03/02/2022 CLINICAL DATA:  Pain and swelling EXAM: BILATERAL LOWER EXTREMITY VENOUS DOPPLER ULTRASOUND TECHNIQUE: Gray-scale sonography with graded compression, as well as color Doppler and duplex ultrasound were performed to evaluate the lower extremity deep venous systems from the level of the common femoral vein and including the common femoral, femoral, profunda femoral, popliteal  and calf veins including the posterior tibial, peroneal and gastrocnemius veins when visible. The superficial great saphenous vein was also interrogated. Spectral Doppler was utilized to evaluate flow at rest and with distal augmentation maneuvers in the common femoral, femoral and popliteal veins. COMPARISON:  None Available. FINDINGS: RIGHT LOWER EXTREMITY Common Femoral Vein: No evidence of thrombus. Normal compressibility, respiratory phasicity and response to augmentation. Saphenofemoral Junction: No evidence of thrombus. Normal compressibility and flow on color Doppler imaging. Profunda Femoral Vein: No evidence of thrombus. Normal compressibility and flow on color Doppler imaging. Femoral Vein: No evidence of  thrombus. Normal compressibility, respiratory phasicity and response to augmentation. Popliteal Vein: No evidence of thrombus. Normal compressibility, respiratory phasicity and response to augmentation. Calf Veins: No evidence of thrombus. Normal compressibility and flow on color Doppler imaging. Superficial Great Saphenous Vein: No evidence of thrombus. Normal compressibility. Venous Reflux:  None. Other Findings:  None. LEFT LOWER EXTREMITY Common Femoral Vein: No evidence of thrombus. Normal compressibility, respiratory phasicity and response to augmentation. Saphenofemoral Junction: No evidence of thrombus. Normal compressibility and flow on color Doppler imaging. Profunda Femoral Vein: No evidence of thrombus. Normal compressibility and flow on color Doppler imaging. Femoral Vein: No evidence of thrombus. Normal compressibility, respiratory phasicity and response to augmentation. Popliteal Vein: No evidence of thrombus. Normal compressibility, respiratory phasicity and response to augmentation. Calf Veins: No evidence of thrombus. Normal compressibility and flow on color Doppler imaging. Superficial Great Saphenous Vein: No evidence of thrombus. Normal compressibility. Venous Reflux:  None. Other Findings:  None. IMPRESSION: No evidence of deep venous thrombosis in either lower extremity. Electronically Signed   By: Sammie Bench M.D.   On: 03/02/2022 15:56    Scheduled Meds:  apixaban  5 mg Oral BID   fluconazole  100 mg Oral Daily   gabapentin  300 mg Oral TID   insulin aspart  0-15 Units Subcutaneous TID WC   insulin aspart  0-5 Units Subcutaneous QHS   insulin glargine-yfgn  12 Units Subcutaneous Daily   ipratropium-albuterol  3 mL Nebulization TID   methylPREDNISolone (SOLU-MEDROL) injection  40 mg Intravenous BID   metoprolol succinate  25 mg Oral Daily   mometasone-formoterol  2 puff Inhalation BID   Continuous Infusions:  azithromycin     cefTRIAXone (ROCEPHIN)  IV     PRN Meds:  acetaminophen **OR** acetaminophen, benzonatate, chlorpheniramine-HYDROcodone, guaiFENesin, hydrALAZINE, lidocaine, LORazepam, menthol-cetylpyridinium, morphine injection, ondansetron **OR** ondansetron (ZOFRAN) IV, senna-docusate  Time spent: 55 minutes  Author: Val Riles. MD [facility] 03/03/2022 12:13 PM  To reach On-call, see care teams to locate the attending and reach out to them via [email or web address]. If 7PM-7AM, please contact night-coverage If you still have difficulty reaching the attending provider, please page the [facility] (Director on Call) for [facility] on amion for assistance.

## 2022-03-03 NOTE — Inpatient Diabetes Management (Addendum)
Inpatient Diabetes Program Recommendations  AACE/ADA: New Consensus Statement on Inpatient Glycemic Control (2015)  Target Ranges:  Prepandial:   less than 140 mg/dL      Peak postprandial:   less than 180 mg/dL (1-2 hours)      Critically ill patients:  140 - 180 mg/dL   Lab Results  Component Value Date   GLUCAP 249 (H) 03/03/2022   HGBA1C 9.8 (A) 02/25/2022    Review of Glycemic Control  Latest Reference Range & Units 03/02/22 17:34 03/02/22 22:17 03/03/22 07:40  Glucose-Capillary 70 - 99 mg/dL 370 (H) 324 (H) 249 (H)  (H): Data is abnormally high Diabetes history: Type 2 Dm Outpatient Diabetes medications: NPH 12 units QD, Novolin R 12 units TID Current orders for Inpatient glycemic control: Semglee 12 units QD, novolog 0-15 units TID & HS Solumedrol 40 mg BID, 125 mg x 1  Inpatient Diabetes Program Recommendations:    Consider adding Novolog 5 units TID (assuming patient is consuming >50% of meals) and increasing Semglee to 16 units QD.   Addendum: attempted to see patient, not currently in room. Will reattempt at a later time.  Thanks, Bronson Curb, MSN, RNC-OB Diabetes Coordinator [phone] (8a-5p)

## 2022-03-03 NOTE — Procedures (Signed)
Patient was brought to [facility] [place] department for ordered right-sided thoracentesis. Upon ultrasound examination of the pleural space, an adequate pocket of pleural fluid was unable to be identified. There was a small volume of fluid identified, but it was not felt that this could be safely accessed at this time. No thoracentesis was attempted today. Order was changed to [place] Chest (Limited). Please contact IR with any questions or concerns.  Lura Em PA-C 03/03/2022 2:09 PM

## 2022-03-03 NOTE — Progress Notes (Signed)
Initial Nutrition Assessment  DOCUMENTATION CODES:   Not applicable  INTERVENTION:   -Glucerna Shake po TID, each supplement provides 220 kcal and 10 grams of protein  -MVI with minerals daily -Liberalize diet to carb modified for wider variety of meal selections  NUTRITION DIAGNOSIS:   Increased nutrient needs related to chronic illness (metastatic breast cancer) as evidenced by estimated needs.  GOAL:   Patient will meet greater than or equal to 90% of their needs  MONITOR:   PO intake, Supplement acceptance  REASON FOR ASSESSMENT:   Malnutrition Screening Tool    ASSESSMENT:   Pt with history of diverticulosis, metastatic breast cancer status postmastectomy, hyperlipidemia, hypothyroid, hypertension, insulin-dependent diabetes mellitus, anxiety, who presents for chief concerns of shortness of breath.  Pt admitted with acute hypoxemic respiratory failure and bilateral pleural effusion, possible due to malignancy.   Reviewed I/O's: -171 ml x 24 hours  UOP: 2.4 L x 24 hours   Per radiology notes, no plans for thoracentesis at this time.   Spoke with pt at bedside, who was pleasant and in good spirits today. She reports feeling better now that her respiratory status has improved. Per pt, she has experienced a general decline in health over the past 3 weeks since last being discharged from the hospital. Per pt, her respiratory status has worsened which has made her weak. Pt reports she was consuming about 1-2 meals per day, as it was difficult for her to get up and prepare herself a meal. She also admits to drinking Glucerna once a day at home to help her get the nutrition she needs. Since being in the hospital, she appetite has improved and she consumed 100% of her breakfast.  Pt shares that she has lost about 40 pounds in the past 3 weeks. Reviewed wt hx; pt has experienced a 6.1% wt loss over the past month, which is significant for time frame.   Discussed importance of  good meal and supplement intake to promote healing. Pt amenable to Glucerna.   Medications reviewed and include solu-medrol.   Lab Results  Component Value Date   HGBA1C 9.8 (A) 02/25/2022   PTA DM medications are 12 units insulin regular daily and 12 units insulin NPH daily.   Labs reviewed: CBGS: 193-790 (inpatient orders for glycemic control are 0-15 units insulin aspart TID with meals, 0-5 units insulin aspart daily at bedtime, and 12 units insulin glargine-yfgn daily).    NUTRITION - FOCUSED PHYSICAL EXAM:  Flowsheet Row Most Recent Value  Orbital Region No depletion  Upper Arm Region No depletion  Thoracic and Lumbar Region No depletion  Buccal Region No depletion  Temple Region No depletion  Clavicle Bone Region No depletion  Clavicle and Acromion Bone Region No depletion  Scapular Bone Region No depletion  Dorsal Hand No depletion  Patellar Region Moderate depletion  Anterior Thigh Region Moderate depletion  Posterior Calf Region Moderate depletion  Edema (RD Assessment) None  Hair Reviewed  Eyes Reviewed  Mouth Reviewed  Skin Reviewed  Nails Reviewed       Diet Order:   Diet Order             Diet heart healthy/carb modified Room service appropriate? Yes; Fluid consistency: Thin  Diet effective now                   EDUCATION NEEDS:   Education needs have been addressed  Skin:  Skin Assessment: Reviewed RN Assessment  Last BM:  03/02/22 (type 4)  Height:   Ht Readings from Last 1 Encounters:  03/02/22 '4\' 11"'$  (1.499 m)    Weight:   Wt Readings from Last 1 Encounters:  03/02/22 61.4 kg    Ideal Body Weight:  44.7 kg  BMI:  Body mass index is 27.34 kg/m.  Estimated Nutritional Needs:   Kcal:  1550-1750  Protein:  75-90 grams  Fluid:  > 1.5 L    Loistine Chance, RD, LDN, [place] Registered Dietitian II Certified Diabetes Care and Education Specialist Please refer to [facility] for RD and/or RD on-call/weekend/after hours pager

## 2022-03-04 DIAGNOSIS — J9601 Acute respiratory failure with hypoxia: Secondary | ICD-10-CM | POA: Diagnosis not present

## 2022-03-04 DIAGNOSIS — Z515 Encounter for palliative care: Secondary | ICD-10-CM | POA: Diagnosis not present

## 2022-03-04 DIAGNOSIS — Z7189 Other specified counseling: Secondary | ICD-10-CM

## 2022-03-04 DIAGNOSIS — C7951 Secondary malignant neoplasm of bone: Secondary | ICD-10-CM | POA: Diagnosis not present

## 2022-03-04 LAB — GLUCOSE, CAPILLARY
Glucose-Capillary: 296 mg/dL — ABNORMAL HIGH (ref 70–99)
Glucose-Capillary: 420 mg/dL — ABNORMAL HIGH (ref 70–99)
Glucose-Capillary: 423 mg/dL — ABNORMAL HIGH (ref 70–99)
Glucose-Capillary: 395 mg/dL — ABNORMAL HIGH (ref 70–99)
Glucose-Capillary: 199 mg/dL — ABNORMAL HIGH (ref 70–99)
Glucose-Capillary: 293 mg/dL — ABNORMAL HIGH (ref 70–99)

## 2022-03-04 LAB — PROCALCITONIN: Procalcitonin: <0.1 ng/mL

## 2022-03-04 MED ORDER — ALUM & MAG HYDROXIDE-SIMETH 200-200-20 MG/5ML PO SUSP
15.0000 mL | ORAL | Status: DC | PRN
  Administered 2022-03-04 – 2022-03-05 (×3): 15 mL via ORAL
  Filled 2022-03-04 (×3): qty 30

## 2022-03-04 MED ORDER — INSULIN ASPART 100 UNIT/ML IJ SOLN
15.0000 [IU] | Freq: Once | INTRAMUSCULAR | Status: DC
  Filled 2022-03-04: qty 0.15

## 2022-03-04 MED ORDER — IPRATROPIUM-ALBUTEROL 0.5-2.5 (3) MG/3ML IN SOLN
3.0000 mL | Freq: Once | RESPIRATORY_TRACT | Status: AC
  Administered 2022-03-04: 3 mL via RESPIRATORY_TRACT
  Filled 2022-03-04: qty 3

## 2022-03-04 MED ORDER — INSULIN ASPART 100 UNIT/ML IJ SOLN
10.0000 [IU] | Freq: Once | INTRAMUSCULAR | Status: AC
  Administered 2022-03-04: 10 [IU] via INTRAVENOUS

## 2022-03-04 MED ORDER — FUROSEMIDE 10 MG/ML IJ SOLN
40.0000 mg | Freq: Two times a day (BID) | INTRAMUSCULAR | Status: DC
  Administered 2022-03-04 – 2022-03-06 (×6): 40 mg via INTRAVENOUS
  Filled 2022-03-04 (×7): qty 4

## 2022-03-04 MED ORDER — IPRATROPIUM-ALBUTEROL 0.5-2.5 (3) MG/3ML IN SOLN
3.0000 mL | Freq: Four times a day (QID) | RESPIRATORY_TRACT | Status: DC | PRN
  Administered 2022-03-05 – 2022-03-07 (×4): 3 mL via RESPIRATORY_TRACT
  Filled 2022-03-04 (×4): qty 3

## 2022-03-04 NOTE — Progress Notes (Signed)
[facility] Progress Note  Patient: Emily Wilson    IZT:[id number]  DOA: 03/02/2022     Date of Service: the patient was seen and examined on 03/04/2022  Chief Complaint  Patient presents with   Rectal Bleeding   Brief hospital course: [name] is a 57 year old female with history of diverticulosis, metastatic breast cancer status postmastectomy, hyperlipidemia, hypothyroid, hypertension, insulin-dependent diabetes mellitus, anxiety, who presents to the emergency department for chief concerns of shortness of breath.   Initial vitals in the emergency department showed temperature of 98, respiration rate of 17, heart rate of 120, blood pressure 107/85, SpO2 of 97% on 2 L nasal cannula. Serum sodium is 135, potassium 4.2, chloride 100, bicarb 22, BUN of 9, serum creatinine of 0.73, nonfasting blood glucose 240, EGFR greater than 60, WBC 5.5, hemoglobin 12.5, platelets of 484. High sensitive troponin is 7.  BNP is 45.4.  Lactic acid is 2.6. ED treatment: DuoNebs one-time dose, sodium chloride 500 mL bolus, azithromycin 500 mg IV, ceftriaxone 2 g IV, Solu-Medrol 125 mg IV.    Assessment and Plan: Principal Problem:   Acute hypoxemic respiratory failure (HCC) Active Problems:   SIRS (systemic inflammatory response syndrome) (HCC)   Anxiety state   Hypertension   Malignant neoplasm of lower-inner quadrant of right breast of female, estrogen receptor positive (HCC)   DM (diabetes mellitus), type 2 (HCC)   HLD (hyperlipidemia)   S/P mastectomy, right   Colovaginal fistula   Pathologic compression fracture of vertebra (HCC)   Multiple subsegmental pulmonary emboli without acute cor pulmonale (HCC)   Metastatic cancer to spine [facility])   Musculoskeletal chest pain   Leg swelling Assessment and Plan:   * Acute hypoxemic respiratory failure (HCC) - Etiology workup in progress - Differentials include atypical pneumonia  - Continue ceftriaxone 2 g IV daily and azithromycin  500 mg IV daily, to complete 5-day course - Flutter valve and incentive spirometry RVP negative COVID and flu swab is pending   Bilateral pleural effusion could be due to malignancy CTA negative for acute PE, showed bilateral pleural effusion right greater than left IR consulted for thoracentesis but could not drain due to no accessible fluid pocket 1/11 started Lasix 40 mg IV twice daily to diurese Continue supplemental oxygen and gradually wean off as per improvement   SIRS (systemic inflammatory response syndrome) ([place]) - Etiology workup in progress at this time, differentials include pneumonia versus UTI in setting of colovaginal fistula - 1 set of blood cultures have been ordered by EDP and is currently in process  - Ordered additional blood culture set - Ordered UA  - Continue follow-up with procalcitonin - 20 pathogen respiratory panel ordered   Leg swelling Mild swelling left lower extremity not very significant Venous duplex negative for DVT    Musculoskeletal chest pain - Per patient this chest pain has been ongoing since November since she started coughing - I suspect this is secondary to musculoskeletal strain in setting of frequent and persistent cough - Tussionex nightly as needed for cough ordered to help patient get rest - Lidocaine patch daily as needed for MSK pain   Metastatic cancer to spine (HCC) - Morphine 4 mg IV every 4 hours as needed for severe pain, 3 doses   Multiple subsegmental pulmonary emboli without acute cor pulmonale  - Resumed home Eliquis 5 mg p.o. twice daily   Pathologic compression fracture of vertebra ([place]) - Continue patient follow-up with medical oncology as appropriate   Colovaginal fistula -  Continue outpatient follow-up as appropriate   DM (diabetes mellitus), type 2 - Patient's NPH of 12 units has been resumed with equivalent  semeglee 12 units daily - Insulin SSI with at bedtime coverage ordered - Goal inpatient blood  glucose levels 140-180   Hypertension - Resumed home metoprolol succinate 25 mg daily - Hydralazine 5 mg IV every 8 hours as needed for SBP greater than 175, 4 days ordered   Anxiety state - Lorazepam 0.5 mg p.o. every 6 hours as needed for anxiety, 3 doses ordered   Body mass index is 27.34 kg/m.  Interventions:  Palliative care consulted for goals of care discussion, patient remained full code. Hospice care was consulted, patient will continue hospice care at home after discharge.     Diet: Heart healthy/carb modified DVT Prophylaxis: Therapeutic Anticoagulation with Eliquis    Advance goals of care discussion: Full code  Family Communication: family was not present at bedside, at the time of interview.  The pt provided permission to discuss medical plan with the family. Opportunity was given to ask question and all questions were answered satisfactorily.   Disposition:  Pt is from Home, admitted with respiratory failure, bilateral pleural effusion, needs thoracentesis and still on oxygen, which precludes a safe discharge. Discharge to home vs hospice, when clinically stable.  Subjective: No significant overnight events, patient still has dry cough and shortness of breath, require supplemental transient pressure we will continue to wean oxygen as per improvement. Patient would like to be treated before discharge so started on IV Lasix.   Physical Exam: General: NAD, lying comfortably, mild SOB, coughing Appear in no distress, affect appropriate Eyes: PERRLA ENT: Oral Mucosa Clear, moist  Neck: no JVD,  Cardiovascular: S1 and S2 Present, no Murmur,  Respiratory: good respiratory effort, Bilateral Air entry equal and Decreased, mild bibasilar crackles crackles, no wheezes Abdomen: Bowel Sound present, Soft and no tenderness,  Skin: no rashes Extremities: no Pedal edema, no calf tenderness Neurologic: without any new focal findings Gait not checked due to patient safety  concerns  Vitals:   03/03/22 2022 03/03/22 2024 03/04/22 0433 03/04/22 0909  BP: 139/80  139/83 139/76  Pulse: 94  (!) 107 (!) 105  Resp: '18  18 18  '$ Temp: 98.2 F (36.8 C)  97.6 F (36.4 C) 97.6 F (36.4 C)  TempSrc: Oral   Oral  SpO2: 93% 93% 92% 94%  Weight:      Height:        Intake/Output Summary (Last 24 hours) at 03/04/2022 1554 Last data filed at 03/04/2022 0630 Gross per 24 hour  Intake 350 ml  Output --  Net 350 ml   Filed Weights   03/02/22 1735  Weight: 61.4 kg    Data Reviewed: I have personally reviewed and interpreted daily labs, tele strips, imagings as discussed above. I reviewed all nursing notes, pharmacy notes, vitals, pertinent old records I have discussed plan of care as described above with RN and patient/family.  CBC: Recent Labs  Lab 03/02/22 0736 03/03/22 0500  WBC 5.5 7.4  NEUTROABS 3.9  --   HGB 12.5 11.8*  HCT 39.3 37.1  MCV 92.9 93.5  PLT 484* 196*   Basic Metabolic Panel: Recent Labs  Lab 03/02/22 0736 03/03/22 0500  NA 135 136  K 4.2 4.6  CL 100 104  CO2 22 22  GLUCOSE 240* 258*  BUN 9 9  CREATININE 0.73 0.58  CALCIUM 9.2 8.8*    Studies: No results found.  Scheduled  Meds:  apixaban  5 mg Oral BID   feeding supplement (GLUCERNA SHAKE)  237 mL Oral TID BM   fluconazole  100 mg Oral Daily   furosemide  40 mg Intravenous BID   gabapentin  300 mg Oral TID   insulin aspart  0-15 Units Subcutaneous TID WC   insulin aspart  0-5 Units Subcutaneous QHS   insulin aspart  15 Units Intravenous Once   insulin glargine-yfgn  12 Units Subcutaneous Daily   metoprolol succinate  25 mg Oral Daily   mometasone-formoterol  2 puff Inhalation BID   multivitamin with minerals  1 tablet Oral Daily   Continuous Infusions:  azithromycin Stopped (03/03/22 1759)   cefTRIAXone (ROCEPHIN)  IV Stopped (03/03/22 1612)   PRN Meds: acetaminophen **OR** acetaminophen, alum & mag hydroxide-simeth, benzonatate, chlorpheniramine-HYDROcodone,  guaiFENesin, hydrALAZINE, lidocaine, LORazepam, menthol-cetylpyridinium, morphine injection, ondansetron **OR** ondansetron (ZOFRAN) IV, senna-docusate  Time spent: 50 minutes  Author: Val Riles. MD [facility] 03/04/2022 3:54 PM  To reach On-call, see care teams to locate the attending and reach out to them via [email or web address]. If 7PM-7AM, please contact night-coverage If you still have difficulty reaching the attending provider, please page the [facility] (Director on Call) for [facility] on amion for assistance.

## 2022-03-04 NOTE — Inpatient Diabetes Management (Signed)
Inpatient Diabetes Program Recommendations  AACE/ADA: New Consensus Statement on Inpatient Glycemic Control (2015)  Target Ranges:  Prepandial:   less than 140 mg/dL      Peak postprandial:   less than 180 mg/dL (1-2 hours)      Critically ill patients:  140 - 180 mg/dL    Latest Reference Range & Units 09/02/21 15:15 12/03/21 10:44 01/25/22 13:55 02/25/22 10:36  Hemoglobin A1C 4.0 - 5.6 % 7.5 ! 7.6 ! 8.9 (H) 9.8 !  235 mg/dl  !: Data is abnormal (H): Data is abnormally high  Latest Reference Range & Units 03/03/22 07:40 03/03/22 11:41 03/03/22 16:37 03/03/22 16:42 03/03/22 20:24  Glucose-Capillary 70 - 99 mg/dL 249 (H)  5 units Novolog '@0853'$  396 (H)  15 units Novolog  12 units Semglee '@1234'$   351 (H) 366 (H)  15 units Novolog  296 (H)  3 units Novolog     Latest Reference Range & Units 03/04/22 07:37 03/04/22 11:43 03/04/22 11:50  Glucose-Capillary 70 - 99 mg/dL 296 (H)  8 units Novolog '@0920'$  420 (H) 423 (H)  (H): Data is abnormally high   Admit with:  Acute hypoxemic respiratory failure SIRS  Bilateral pleural effusion could be due to malignancy   History: DM, Metastatic breast cancer to spine   Home DM Meds: NPH 12 units daily     Novolin R 12 units TID   Current Orders: Novolog Moderate Correction Scale/ SSI (0-15 units) TID AC + HS      Semglee 12 units Daily     Note Solumedrol has been stopped  Suspect severe Hyperglycemia yest 1/10 was due to the effects of the steroids--Effect may be prolonged today as CBGs remain quite elevated   MD- May consider the following:  --Increase Semglee to 15 units Daily (please give an extra 3 units Semglee X 1 dose today since 12 unit dose already given this AM)  --Start Novolog Meal Coverage if PO intake at least 50% meals Novolog 4 units TID with meals HOLD if pt NPO HOLD if pt eats <50% meals     --Will follow patient during hospitalization--  Wyn Quaker RN, MSN, [facility]  Coordinator Inpatient Glycemic Control Team Team Pager: [phone] (8a-5p)

## 2022-03-04 NOTE — Progress Notes (Addendum)
   This pt has been referred to Hospice services at home by Dr. Geralyn Flash office on 02-25-22. We had a scheduled appointment to go out and enroll her on 02/26/22 which she cancelled to go to ED. Pt d/c from hospital and was rescheduled for 03-02-22. We called the morning of 03-02-22 to confirm the visit see if she needed any supplies and again the pt stated she would need to cancel the visit because she needed to go to the ED again.   Pt lives alone and has two dogs that she cares for. She has moved in with a friend who is there in the evenings after work but not during the day. We question if pt's goals are in line with hospice services with her returning to the ED so often. May benefit from Palliative Care consult in hospital to try and assist with goals.   We will follow while in hospital and be happy to offer hospice care in the home when she is d/c. Please reach out with questions.   Webb Silversmith RN [phone]

## 2022-03-04 NOTE — Consult Note (Signed)
Consultation Note Date: 03/04/2022   Patient Name: Emily Wilson  DOB: 1965-09-14  MRN: [id number]  Age / Sex: 57 y.o., female  PCP: Dorna Mai, MD Referring Physician: Val Riles, MD  Reason for Consultation: Establishing goals of care and Hospice Evaluation  HPI/Patient Profile: 57 y.o. female  with past medical history of metastatic breast cancer to spine and liver, PE, diverticulosis, colovaginal fistula, hyperlipidemia, hypothyroid, hypertension, insulin-dependent diabetes, anxiety admitted on 03/02/2022 with worsened shortness of breath with sternal spasm pain radiating under right breast with SIRS pneumonia vs UTI with known colovaginal fistula. Also with bilateral pleural effusion right > left but felt to be too small for thoracentesis per IR. She was connected with hospice by oncology who tried to admit on 2 occasions but appointment cancelled in favor of hospitalization.   Clinical Assessment and Goals of Care: Consult received and extensive chart reviewed. I discussed with RN. I met today at [facility] bedside and no family or visitors present. Emily Wilson is awake, alert, oriented. She has c-collar and oxygen. She has had cough and shortness of breath but this is much worse. She is fully aware of her condition and poor prognosis. She shares that she has no desire to pursue further oncology treatment telling me that she didn't feel she could survive anymore chemotherapy. She says "I probably wouldn't live 3 weeks on chemo." She tells me that she wants to focus on her comfort and quality of life. She wants hospice support at home. She tells me that she was supposed to meet with hospice but her breathing became so bad she could not wait at home until oxygen could be provided. She needs assistance and equipment in the home. She lives with her significant other currently as she would feel more comfortable having  assistance with bathing, etc from him other than her son. She does feel continue to endorse her desire for hospice at home and agrees with hospice philosophy for comfort at home.   We further discussed her goals of care and fears. Emily Wilson shares about her personal history and how much she values her independence and taking care of herself. It is difficult for her to rely on others for help. She is trying to work on this to allow her friends and family to help her more and she knows that with time she will need help more and more. She fears loss of her independence. With this she shares that she hopes she will be able to drive again while also acknowledging this may not be possible. We discussed her wishes for code status and discussed DNR. She tells me that they asked her about this when she came to the hospital. She expresses her desire to maintain full code admitting "I'm still young." I expressed understanding but encouraged her to consider the poor outcome and suffering that resuscitative measures would cause her. I expressed that this would be much more difficult than chemotherapy and would not provide her with the comfort and quality of life she desires. I encouraged her  to think and consider this further. She needs more time to process and will remain full code at this time. I assured her that I will update the medical team, TOC, and hospice about her wishes and plans.   All questions/concerns addressed. Emotional support provided. Updated parties listed above and discussed with hospice liaison so they can make preparations to receive her when discharged.   Primary Decision Maker PATIENT    SUMMARY OF RECOMMENDATIONS   - Full code for now (further discussion at home with hospice) - [name] to follow at home when discharged  Code Status/Advance Care Planning: Full code   Symptom Management:  Per attending.  Hospice can manage PRN morphine/ativan at home.   Prognosis:  < 6  months  Discharge Planning: Home with Hospice      Primary Diagnoses: Present on Admission:  Acute hypoxemic respiratory failure (HCC)  Pathologic compression fracture of vertebra (HCC)  Metastatic cancer to spine (HCC)  Hypertension  HLD (hyperlipidemia)  Anxiety state  Colovaginal fistula  Multiple subsegmental pulmonary emboli without acute cor pulmonale ([place])   I have reviewed the medical record, interviewed the patient and family, and examined the patient. The following aspects are pertinent.  Past Medical History:  Diagnosis Date   Anxiety state, unspecified    Arthritis    Breast cancer of lower-inner quadrant of right female breast [facility])    Carpal tunnel syndrome    Complication of anesthesia    woke up during ganglion cyst removal in her 20's, not woken up since   Diabetes mellitus without complication (HCC)    Type II   Diverticulosis    Heart murmur    History of radiation therapy 12/18/18- 01/30/19   Right Chest wall 25 fractions X 2Gy each to total 50 Gy, followed by a boost 10 Gy in 5 fractions.    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, without mention of thyrotoxic crisis or storm    Social History   Socioeconomic History   Marital status: Divorced    Spouse name: Not on file   Number of children: 2   Years of education: Not on file   Highest education level: Not on file  Occupational History   Occupation: Disabled  Tobacco Use   Smoking status: Former    Packs/day: 3.00    Years: 30.00    Total pack years: 90.00    Types: Cigarettes    Quit date: 10/24/2011    Years since quitting: 10.3   Smokeless tobacco: Never  Vaping Use   Vaping Use: Never used  Substance and Sexual Activity   Alcohol use: No   Drug use: Never   Sexual activity: Not Currently  Other Topics Concern   Not on file   Social History Narrative   Not on file   Social Determinants of Health   Financial Resource Strain: Medium Risk (05/01/2021)   Overall Financial Resource Strain (CARDIA)    Difficulty of Paying Living Expenses: Somewhat hard  Food Insecurity: No Food Insecurity (03/02/2022)   Hunger Vital Sign    Worried About Running Out of Food in the Last Year: Never true    Ran Out of Food in the Last Year: Never true  Transportation Needs: No Transportation Needs (03/02/2022)   PRAPARE - Transportation    Lack  of Transportation (Medical): No    Lack of Transportation (Non-Medical): No  Recent Concern: Transportation Needs - Unmet Transportation Needs (02/18/2022)   PRAPARE - Hydrologist (Medical): Yes    Lack of Transportation (Non-Medical): No  Physical Activity: Inactive (05/01/2021)   Exercise Vital Sign    Days of Exercise per Week: 0 days    Minutes of Exercise per Session: 0 min  Stress: Stress Concern Present (05/01/2021)   [place]    Feeling of Stress : Very much  Social Connections: Socially Isolated (05/01/2021)   Social Connection and Isolation Panel [NHANES]    Frequency of Communication with Friends and Family: More than three times a week    Frequency of Social Gatherings with Friends and Family: More than three times a week    Attends Religious Services: Never    Marine scientist or Organizations: No    Attends Music therapist: Never    Marital Status: Divorced   Family History  Problem Relation Age of Onset   Diabetes Mother    Bladder Cancer Mother 23       smoker   Other Mother 61       TAH for unspecified reason   Multiple sclerosis Mother    Kidney failure Mother    Cancer Father 77       lymphatic/tonsil cancer - in remission; former smoker   Diabetes Sister    Diabetes Sister    Heart attack Brother    COPD Maternal Grandmother        smoker    Emphysema Maternal Grandmother        smoker   Diabetes Maternal Grandmother    Stroke Maternal Grandfather    Diabetes Paternal Grandmother    Dementia Maternal Uncle    COPD Maternal Uncle        smoker   Multiple sclerosis Paternal Aunt    Breast cancer Paternal Aunt        dx. late 39s - early 97s   Scheduled Meds:  apixaban  5 mg Oral BID   feeding supplement (GLUCERNA SHAKE)  237 mL Oral TID BM   fluconazole  100 mg Oral Daily   gabapentin  300 mg Oral TID   insulin aspart  0-15 Units Subcutaneous TID WC   insulin aspart  0-5 Units Subcutaneous QHS   insulin glargine-yfgn  12 Units Subcutaneous Daily   metoprolol succinate  25 mg Oral Daily   mometasone-formoterol  2 puff Inhalation BID   multivitamin with minerals  1 tablet Oral Daily   Continuous Infusions:  azithromycin Stopped (03/03/22 1759)   cefTRIAXone (ROCEPHIN)  IV Stopped (03/03/22 1612)   PRN Meds:.acetaminophen **OR** acetaminophen, alum & mag hydroxide-simeth, benzonatate, chlorpheniramine-HYDROcodone, guaiFENesin, hydrALAZINE, lidocaine, LORazepam, menthol-cetylpyridinium, morphine injection, ondansetron **OR** ondansetron (ZOFRAN) IV, senna-docusate Allergies  Allergen Reactions   Doxycycline Shortness Of Breath    Wheezing, shortness of breath, rash head to toe, and swelling   Naproxen Shortness Of Breath   Oxycodone-Acetaminophen Itching   Codeine Hives   Hydrocodone-Acetaminophen Hives   Ibuprofen     Irritant to stomach r/t diverticulitis   Lantus [Insulin Glargine]     Yeast Infections   Meloxicam Other (See Comments)    Abdominal pain    Propoxyphene N-Acetaminophen Hives   Sulfa Antibiotics Hives   Sulfonamide Derivatives Hives   Tramadol Other (See Comments)    Abdominal Pain   Review of  Systems  Constitutional:  Positive for activity change, appetite change and fatigue.  Respiratory:  Positive for cough and shortness of breath.   Neurological:  Positive for weakness.    Physical  Exam Vitals and nursing note reviewed.  Constitutional:      General: She is awake. She is not in acute distress.    Appearance: She is ill-appearing.     Comments: C-collar  Cardiovascular:     Rate and Rhythm: Tachycardia present.  Pulmonary:     Effort: No tachypnea, accessory muscle usage or respiratory distress.  Abdominal:     General: Abdomen is flat.  Neurological:     Mental Status: She is alert and oriented to person, place, and time.     Vital Signs: BP 139/76 (BP Location: Left Leg)   Pulse (!) 105   Temp 97.6 F (36.4 C) (Oral)   Resp 18   Ht '4\' 11"'$  (1.499 m)   Wt 61.4 kg   SpO2 94%   BMI 27.34 kg/m  Pain Scale: 0-10   Pain Score: 0-No pain   SpO2: SpO2: 94 % O2 Device:SpO2: 94 % O2 Flow Rate: .O2 Flow Rate (L/min): 2 L/min  IO: Intake/output summary:  Intake/Output Summary (Last 24 hours) at 03/04/2022 1016 Last data filed at 03/04/2022 0630 Gross per 24 hour  Intake 350 ml  Output --  Net 350 ml    LBM: Last BM Date : 03/03/22 Baseline Weight: Weight: 61.4 kg Most recent weight: Weight: 61.4 kg     Palliative Assessment/Data:     Time In: 1015  Time Total: 90 min  Greater than 50%  of this time was spent counseling and coordinating care related to the above assessment and plan.  Signed by: Vinie Sill, NP Palliative Medicine Team Pager # [phone] (M-F 8a-5p) Team Phone # [phone] (Nights/Weekends)

## 2022-03-05 DIAGNOSIS — J9601 Acute respiratory failure with hypoxia: Secondary | ICD-10-CM | POA: Diagnosis not present

## 2022-03-05 LAB — CBC
HCT: 42.6 % (ref 36.0–46.0)
Hemoglobin: 13.6 g/dL (ref 12.0–15.0)
MCH: 29.3 pg (ref 26.0–34.0)
MCHC: 31.9 g/dL (ref 30.0–36.0)
MCV: 91.8 fL (ref 80.0–100.0)
Platelets: 429 10*3/uL — ABNORMAL HIGH (ref 150–400)
RBC: 4.64 MIL/uL (ref 3.87–5.11)
RDW: 15.2 % (ref 11.5–15.5)
WBC: 9.8 10*3/uL (ref 4.0–10.5)
nRBC: 0.7 % — ABNORMAL HIGH (ref 0.0–0.2)

## 2022-03-05 LAB — BASIC METABOLIC PANEL
Anion gap: 14 (ref 5–15)
BUN: 21 mg/dL — ABNORMAL HIGH (ref 6–20)
CO2: 26 mmol/L (ref 22–32)
Calcium: 9.1 mg/dL (ref 8.9–10.3)
Chloride: 92 mmol/L — ABNORMAL LOW (ref 98–111)
Creatinine, Ser: 0.64 mg/dL (ref 0.44–1.00)
GFR, Estimated: >60 mL/min (ref >60)
Glucose, Bld: 238 mg/dL — ABNORMAL HIGH (ref 70–99)
Potassium: 4.1 mmol/L (ref 3.5–5.1)
Sodium: 132 mmol/L — ABNORMAL LOW (ref 135–145)

## 2022-03-05 LAB — PHOSPHORUS: Phosphorus: 3.5 mg/dL (ref 2.5–4.6)

## 2022-03-05 LAB — GLUCOSE, CAPILLARY
Glucose-Capillary: 181 mg/dL — ABNORMAL HIGH (ref 70–99)
Glucose-Capillary: 203 mg/dL — ABNORMAL HIGH (ref 70–99)
Glucose-Capillary: 214 mg/dL — ABNORMAL HIGH (ref 70–99)
Glucose-Capillary: 128 mg/dL — ABNORMAL HIGH (ref 70–99)
Glucose-Capillary: 172 mg/dL — ABNORMAL HIGH (ref 70–99)

## 2022-03-05 LAB — RESP PANEL BY RT-PCR (RSV, FLU A&B, COVID)  RVPGX2
Influenza A by PCR: NEGATIVE
Influenza B by PCR: NEGATIVE
Resp Syncytial Virus by PCR: NEGATIVE
SARS Coronavirus 2 by RT PCR: NEGATIVE

## 2022-03-05 LAB — MAGNESIUM: Magnesium: 2.5 mg/dL — ABNORMAL HIGH (ref 1.7–2.4)

## 2022-03-05 NOTE — Progress Notes (Signed)
[facility] Progress Note  Patient: Emily Wilson    PHX:[id number]  DOA: 03/02/2022     Date of Service: the patient was seen and examined on 03/05/2022  Chief Complaint  Patient presents with   Rectal Bleeding   Brief hospital course: Ms. Geneva Barrero is a 57 year old female with history of diverticulosis, metastatic breast cancer status postmastectomy, hyperlipidemia, hypothyroid, hypertension, insulin-dependent diabetes mellitus, anxiety, who presents to the emergency department for chief concerns of shortness of breath.   Initial vitals in the emergency department showed temperature of 98, respiration rate of 17, heart rate of 120, blood pressure 107/85, SpO2 of 97% on 2 L nasal cannula. Serum sodium is 135, potassium 4.2, chloride 100, bicarb 22, BUN of 9, serum creatinine of 0.73, nonfasting blood glucose 240, EGFR greater than 60, WBC 5.5, hemoglobin 12.5, platelets of 484. High sensitive troponin is 7.  BNP is 45.4.  Lactic acid is 2.6. ED treatment: DuoNebs one-time dose, sodium chloride 500 mL bolus, azithromycin 500 mg IV, ceftriaxone 2 g IV, Solu-Medrol 125 mg IV.    Assessment and Plan: Principal Problem:   Acute hypoxemic respiratory failure (HCC) Active Problems:   SIRS (systemic inflammatory response syndrome) (HCC)   Anxiety state   Hypertension   Malignant neoplasm of lower-inner quadrant of right breast of female, estrogen receptor positive (HCC)   DM (diabetes mellitus), type 2 (HCC)   HLD (hyperlipidemia)   S/P mastectomy, right   Colovaginal fistula   Pathologic compression fracture of vertebra (HCC)   Multiple subsegmental pulmonary emboli without acute cor pulmonale (HCC)   Metastatic cancer to spine [facility])   Musculoskeletal chest pain   Leg swelling Assessment and Plan:   Acute hypoxemic respiratory failure most likely due to bilateral pleural effusion SIRS (systemic inflammatory response syndrome)  Differentials include atypical pneumonia,  procalcitonin negative, less likely bacterial infection but due to immunocompromise status, we will continue antibiotics. Continue ceftriaxone 2 g IV daily and azithromycin 500 mg IV daily, to complete 5-day course - Flutter valve and incentive spirometry RVP negative, COVID and flu negative   Bilateral pleural effusion could be due to malignancy CTA negative for acute PE, showed bilateral pleural effusion right greater than left IR consulted for thoracentesis but could not drain due to no accessible fluid pocket 1/11 started Lasix 40 mg IV twice daily to diurese Continue supplemental oxygen and gradually wean off as per improvement Follow repeat chest x-ray tomorrow a.m. for pleural effusion if improving then patient can be discharged home otherwise may keep for thoracentesis on Monday   Leg swelling,  Mild swelling left lower extremity not very significant Venous duplex negative for DVT Improved, patient is on Lasix.   Musculoskeletal chest pain - Per patient this chest pain has been ongoing since November since she started coughing - I suspect this is secondary to musculoskeletal strain in setting of frequent and persistent cough - Tussionex nightly as needed for cough ordered to help patient get rest - Lidocaine patch daily as needed for MSK pain   Metastatic cancer to spine  - Morphine 4 mg IV every 4 hours as needed for severe pain, 3 doses   Multiple subsegmental pulmonary emboli without acute cor pulmonale  - Resumed home Eliquis 5 mg p.o. twice daily   Pathologic compression fracture of vertebra - Continue patient follow-up with medical oncology as appropriate   Colovaginal fistula - Continue outpatient follow-up as appropriate   DM (diabetes mellitus), type 2 - Patient's NPH of 12 units has  been resumed with equivalent  semeglee 12 units daily - Insulin SSI with at bedtime coverage ordered - Goal inpatient blood glucose levels 140-180   Hypertension - Resumed home  metoprolol succinate 25 mg daily - Hydralazine 5 mg IV every 8 hours as needed for SBP greater than 175, 4 days ordered   Anxiety state - Lorazepam 0.5 mg p.o. every 6 hours as needed for anxiety, 3 doses ordered   Body mass index is 27.34 kg/m.  Interventions:  Palliative care consulted for goals of care discussion, patient remained full code. Hospice care was consulted, patient will continue hospice care at home after discharge.     Diet: Heart healthy/carb modified DVT Prophylaxis: Therapeutic Anticoagulation with Eliquis    Advance goals of care discussion: Full code  Family Communication: family was not present at bedside, at the time of interview.  The pt provided permission to discuss medical plan with the family. Opportunity was given to ask question and all questions were answered satisfactorily.   Disposition:  Pt is from Home, admitted with respiratory failure, bilateral pleural effusion, may needs thoracentesis and still on oxygen, which precludes a safe discharge. Discharge to home with hospice care,  when clinically stable. Started IV Lasix twice daily, repeat chest x-ray tomorrow a.m. for pleural effusion, if improves then patient can be discharged home otherwise keep for thoracentesis on Monday.   Subjective: No significant overnight events, patient still having shortness of breath and requiring supplemental oxygen, still has off-and-on pain and muscle spasms and mild cough.  No any other active issues.  Physical Exam: General: NAD, lying comfortably, mild SOB, coughing Appear in no distress, affect appropriate Eyes: PERRLA ENT: Oral Mucosa Clear, moist  Neck: no JVD,  Cardiovascular: S1 and S2 Present, no Murmur,  Respiratory: good respiratory effort, Bilateral Air entry equal and Decreased, mild bibasilar crackles crackles, no wheezes Abdomen: Bowel Sound present, Soft and no tenderness,  Skin: no rashes Extremities: no Pedal edema, no calf  tenderness Neurologic: without any new focal findings Gait not checked due to patient safety concerns  Vitals:   03/04/22 2107 03/05/22 0517 03/05/22 0752 03/05/22 0900  BP: 127/84 119/70 130/69   Pulse: 99 90 90   Resp: '19 20 18   '$ Temp: 98 F (36.7 C) 98.9 F (37.2 C) 98.4 F (36.9 C)   TempSrc:      SpO2: 90% 91% 94% 94%  Weight:      Height:        Intake/Output Summary (Last 24 hours) at 03/05/2022 1442 Last data filed at 03/04/2022 2307 Gross per 24 hour  Intake --  Output 1200 ml  Net -1200 ml   Filed Weights   03/02/22 1735  Weight: 61.4 kg    Data Reviewed: I have personally reviewed and interpreted daily labs, tele strips, imagings as discussed above. I reviewed all nursing notes, pharmacy notes, vitals, pertinent old records I have discussed plan of care as described above with RN and patient/family.  CBC: Recent Labs  Lab 03/02/22 0736 03/03/22 0500 03/05/22 0932  WBC 5.5 7.4 9.8  NEUTROABS 3.9  --   --   HGB 12.5 11.8* 13.6  HCT 39.3 37.1 42.6  MCV 92.9 93.5 91.8  PLT 484* 467* 341*   Basic Metabolic Panel: Recent Labs  Lab 03/02/22 0736 03/03/22 0500 03/05/22 0932  NA 135 136 132*  K 4.2 4.6 4.1  CL 100 104 92*  CO2 '22 22 26  '$ GLUCOSE 240* 258* 238*  BUN 9 9  21*  CREATININE 0.73 0.58 0.64  CALCIUM 9.2 8.8* 9.1  MG  --   --  2.5*  PHOS  --   --  3.5    Studies: No results found.  Scheduled Meds:  apixaban  5 mg Oral BID   feeding supplement (GLUCERNA SHAKE)  237 mL Oral TID BM   fluconazole  100 mg Oral Daily   furosemide  40 mg Intravenous BID   gabapentin  300 mg Oral TID   insulin aspart  0-15 Units Subcutaneous TID WC   insulin aspart  0-5 Units Subcutaneous QHS   insulin aspart  15 Units Intravenous Once   insulin glargine-yfgn  12 Units Subcutaneous Daily   metoprolol succinate  25 mg Oral Daily   mometasone-formoterol  2 puff Inhalation BID   multivitamin with minerals  1 tablet Oral Daily   Continuous Infusions:   azithromycin 500 mg (03/04/22 1748)   cefTRIAXone (ROCEPHIN)  IV 2 g (03/05/22 1417)   PRN Meds: acetaminophen **OR** acetaminophen, alum & mag hydroxide-simeth, benzonatate, chlorpheniramine-HYDROcodone, guaiFENesin, hydrALAZINE, ipratropium-albuterol, lidocaine, LORazepam, menthol-cetylpyridinium, morphine injection, ondansetron **OR** ondansetron (ZOFRAN) IV, senna-docusate  Time spent: 50 minutes  Author: Val Riles. MD [facility] 03/05/2022 2:42 PM  To reach On-call, see care teams to locate the attending and reach out to them via [email or web address]. If 7PM-7AM, please contact night-coverage If you still have difficulty reaching the attending provider, please page the [facility] (Director on Call) for [facility] on amion for assistance.

## 2022-03-05 NOTE — Care Management Important Message (Signed)
Important Message  Patient Details  Name: Emily Wilson MRN: [id number] Date of Birth: [birth year]   Medicare Important Message Given:  N/A - LOS <3 / Initial given by admissions     Juliann Pulse A Cassidy Tabet 03/05/2022, 7:44 AM

## 2022-03-05 NOTE — Progress Notes (Signed)
   This pt was referred to hospice care for services at home. She has agreed to hospice care and our MD has approved her for services at home. She reports that she wants comfort care and to not return to hospital. She has been sleeping in a recliner at home prior to admission to hospital. She reports she can not lay flat anymore. She has requested for oxygen, hospital bed, overbed table, BSC, rollator, nebulizer for deliver. This has been ordered. Her friend whom she lives with works unitl 500pm and will be home today 600pm to have equipment delivered.   Webb Silversmith RN [phone]

## 2022-03-06 ENCOUNTER — Inpatient Hospital Stay: Payer: Medicare Other

## 2022-03-06 DIAGNOSIS — R6510 Systemic inflammatory response syndrome (SIRS) of non-infectious origin without acute organ dysfunction: Secondary | ICD-10-CM | POA: Diagnosis not present

## 2022-03-06 DIAGNOSIS — N824 Other female intestinal-genital tract fistulae: Secondary | ICD-10-CM | POA: Diagnosis not present

## 2022-03-06 DIAGNOSIS — F411 Generalized anxiety disorder: Secondary | ICD-10-CM

## 2022-03-06 DIAGNOSIS — J9601 Acute respiratory failure with hypoxia: Secondary | ICD-10-CM | POA: Diagnosis not present

## 2022-03-06 DIAGNOSIS — Z9011 Acquired absence of right breast and nipple: Secondary | ICD-10-CM

## 2022-03-06 DIAGNOSIS — I10 Essential (primary) hypertension: Secondary | ICD-10-CM

## 2022-03-06 LAB — MAGNESIUM: Magnesium: 2.4 mg/dL (ref 1.7–2.4)

## 2022-03-06 LAB — CBC
HCT: 38.6 % (ref 36.0–46.0)
Hemoglobin: 12.5 g/dL (ref 12.0–15.0)
MCH: 30 pg (ref 26.0–34.0)
MCHC: 32.4 g/dL (ref 30.0–36.0)
MCV: 92.6 fL (ref 80.0–100.0)
Platelets: 371 10*3/uL (ref 150–400)
RBC: 4.17 MIL/uL (ref 3.87–5.11)
RDW: 15 % (ref 11.5–15.5)
WBC: 9.5 10*3/uL (ref 4.0–10.5)
nRBC: 1.2 % — ABNORMAL HIGH (ref 0.0–0.2)

## 2022-03-06 LAB — BASIC METABOLIC PANEL
Anion gap: 12 (ref 5–15)
BUN: 15 mg/dL (ref 6–20)
CO2: 26 mmol/L (ref 22–32)
Calcium: 8.6 mg/dL — ABNORMAL LOW (ref 8.9–10.3)
Chloride: 93 mmol/L — ABNORMAL LOW (ref 98–111)
Creatinine, Ser: 0.58 mg/dL (ref 0.44–1.00)
GFR, Estimated: >60 mL/min (ref >60)
Glucose, Bld: 190 mg/dL — ABNORMAL HIGH (ref 70–99)
Potassium: 4 mmol/L (ref 3.5–5.1)
Sodium: 131 mmol/L — ABNORMAL LOW (ref 135–145)

## 2022-03-06 LAB — PHOSPHORUS: Phosphorus: 3.6 mg/dL (ref 2.5–4.6)

## 2022-03-06 LAB — GLUCOSE, CAPILLARY
Glucose-Capillary: 201 mg/dL — ABNORMAL HIGH (ref 70–99)
Glucose-Capillary: 312 mg/dL — ABNORMAL HIGH (ref 70–99)
Glucose-Capillary: 187 mg/dL — ABNORMAL HIGH (ref 70–99)
Glucose-Capillary: 159 mg/dL — ABNORMAL HIGH (ref 70–99)

## 2022-03-06 NOTE — Progress Notes (Signed)
Progress Note   Patient: Emily Wilson:[id number] DOB: Nov 10, 1965 DOA: 03/02/2022     3 DOS: the patient was seen and examined on 03/06/2022   Brief hospital course: [name] is a 57 year old female with history of diverticulosis, metastatic breast cancer status postmastectomy, hyperlipidemia, hypothyroid, hypertension, insulin-dependent diabetes mellitus, anxiety, who presents to the emergency department for chief concerns of shortness of breath.  Initial vitals in the emergency department showed temperature of 98, respiration rate of 17, heart rate of 120, blood pressure 107/85, SpO2 of 97% on 2 L nasal cannula.  Serum sodium is 135, potassium 4.2, chloride 100, bicarb 22, BUN of 9, serum creatinine of 0.73, nonfasting blood glucose 240, EGFR greater than 60, WBC 5.5, hemoglobin 12.5, platelets of 484.  High sensitive troponin is 7.  BNP is 45.4.  Lactic acid is 2.6.  ED treatment: DuoNebs one-time dose, sodium chloride 500 mL bolus, azithromycin 500 mg IV, ceftriaxone 2 g IV, Solu-Medrol 125 mg IV.  Assessment and Plan: * Acute hypoxemic respiratory failure (HCC) Most likely secondary to bilateral pleural effusion -could be due to malignancy SIRS CTA negative for acute PE, showed bilateral pleural effusion right greater than left.  Chest x-ray from today does show worsening from before -consider thoracentesis on Monday IR consulted for thoracentesis but could not drain due to no accessible fluid pocket 1/11 started Lasix 40 mg IV twice daily to diurese. Net IO Since Admission: -1,684.37 mL [03/06/22 1117]  Continue supplemental oxygen and gradually wean off as per improvement.  Currently on 2 L oxygen via nasal cannula - Differentials include atypical pneumonia  - Continue ceftriaxone 2 g IV daily and azithromycin 500 mg IV daily, to complete 5-day course - Flutter valve and incentive spirometry -COVID, RVP and flu negative  Leg swelling - Patient endorses that she  missed several Eliquis dosing over the last couple of weeks and she has experienced worsening left lower extremity swelling - Ultrasound of the lower extremity negative for DVT   Musculoskeletal chest pain - Per patient this chest pain has been ongoing since November since she started coughing - I suspect this is secondary to musculoskeletal strain in setting of frequent and persistent cough - Tussionex nightly as needed for cough ordered to help patient get rest - Lidocaine patch daily as needed for MSK pain  Metastatic cancer to spine (HCC) - Morphine 4 mg IV every 4 hours as needed for severe pain, 3 doses  Multiple subsegmental pulmonary emboli without acute cor pulmonale (HCC) - Resumed home Eliquis 5 mg p.o. twice daily  Pathologic compression fracture of vertebra ([place]) - Continue patient follow-up with medical oncology as appropriate  Colovaginal fistula - Continue outpatient follow-up as appropriate  DM (diabetes mellitus), type 2 ([place]) - Patient's NPH of 12 units has been resumed with equivalent  semeglee 12 units daily - Insulin SSI with at bedtime coverage ordered - Goal inpatient blood glucose levels 140-180  Hypertension - Continue home metoprolol succinate 25 mg daily - Hydralazine 5 mg IV every 8 hours as needed for SBP greater than 175, 4 days ordered  Anxiety state - Lorazepam 0.5 mg p.o. every 6 hours as needed for anxiety, 3 doses ordered        Subjective: Still quite short of breath and needing 2 L oxygen, not feeling comfortable going home yet till Monday  Physical Exam: Vitals:   03/05/22 1643 03/05/22 1741 03/05/22 2025 03/06/22 0858  BP: 105/67  115/73 111/73  Pulse: 95  Marland Kitchen)  103 (!) 107  Resp: '20  18 16  '$ Temp: 98.3 F (36.8 C)  98.4 F (36.9 C) 97.8 F (36.6 C)  TempSrc:   Oral   SpO2: 92% 94% 94% 95%  Weight:      Height:       General: NAD, lying comfortably, coughing Appear in no distress, affect appropriate Eyes: PERRLA ENT: Oral  Mucosa Clear, moist  Neck: no JVD,  Cardiovascular: S1 and S2 Present, no Murmur,  Respiratory: good respiratory effort, decreased air entry at the bases, mild bibasilar crackles crackles, no wheezes Abdomen: Bowel Sound present, Soft and no tenderness,  Skin: no rashes Extremities: no Pedal edema, no calf tenderness Neurologic: Nonfocal, alert and oriented Data Reviewed:  Sodium 131, chloride 93  Family Communication: None at bedside  Disposition: Status is: Inpatient Remains inpatient appropriate because: Possible thoracentesis on Monday and setting up hospice and DME at home who possible discharge on Monday/Tuesday-home with hospice  Planned Discharge Destination:  Home with hospice   DVT prophylaxis-Eliquis Time spent: 5 minutes  Author: Max Sane, MD 03/06/2022 11:14 AM  For on call review [email or web address].

## 2022-03-07 DIAGNOSIS — J9601 Acute respiratory failure with hypoxia: Secondary | ICD-10-CM | POA: Diagnosis not present

## 2022-03-07 LAB — CULTURE, BLOOD (SINGLE)
Culture: NO GROWTH
Culture: NO GROWTH
Special Requests: ADEQUATE
Special Requests: ADEQUATE

## 2022-03-07 LAB — CBC
HCT: 36.3 % (ref 36.0–46.0)
Hemoglobin: 11.8 g/dL — ABNORMAL LOW (ref 12.0–15.0)
MCH: 29.6 pg (ref 26.0–34.0)
MCHC: 32.5 g/dL (ref 30.0–36.0)
MCV: 91 fL (ref 80.0–100.0)
Platelets: 353 10*3/uL (ref 150–400)
RBC: 3.99 MIL/uL (ref 3.87–5.11)
RDW: 14.8 % (ref 11.5–15.5)
WBC: 8.2 10*3/uL (ref 4.0–10.5)
nRBC: 0.9 % — ABNORMAL HIGH (ref 0.0–0.2)

## 2022-03-07 LAB — BASIC METABOLIC PANEL
Anion gap: 12 (ref 5–15)
BUN: 15 mg/dL (ref 6–20)
CO2: 27 mmol/L (ref 22–32)
Calcium: 9 mg/dL (ref 8.9–10.3)
Chloride: 89 mmol/L — ABNORMAL LOW (ref 98–111)
Creatinine, Ser: 0.58 mg/dL (ref 0.44–1.00)
GFR, Estimated: >60 mL/min (ref >60)
Glucose, Bld: 271 mg/dL — ABNORMAL HIGH (ref 70–99)
Potassium: 3.5 mmol/L (ref 3.5–5.1)
Sodium: 128 mmol/L — ABNORMAL LOW (ref 135–145)

## 2022-03-07 LAB — GLUCOSE, CAPILLARY
Glucose-Capillary: 223 mg/dL — ABNORMAL HIGH (ref 70–99)
Glucose-Capillary: 392 mg/dL — ABNORMAL HIGH (ref 70–99)
Glucose-Capillary: 410 mg/dL — ABNORMAL HIGH (ref 70–99)
Glucose-Capillary: 167 mg/dL — ABNORMAL HIGH (ref 70–99)
Glucose-Capillary: 288 mg/dL — ABNORMAL HIGH (ref 70–99)

## 2022-03-07 MED ORDER — FUROSEMIDE 10 MG/ML IJ SOLN
40.0000 mg | Freq: Every day | INTRAMUSCULAR | Status: DC
  Administered 2022-03-07: 40 mg via INTRAVENOUS

## 2022-03-07 MED ORDER — FUROSEMIDE 10 MG/ML IJ SOLN
40.0000 mg | Freq: Two times a day (BID) | INTRAMUSCULAR | Status: DC
  Administered 2022-03-07 – 2022-03-09 (×4): 40 mg via INTRAVENOUS
  Filled 2022-03-07 (×4): qty 4

## 2022-03-07 NOTE — Progress Notes (Signed)
PROGRESS NOTE    Emily Wilson  YIA:[id number] DOB: August 07, 1965 DOA: 03/02/2022 PCP: Dorna Mai, MD   Brief Narrative:  [name] is a 57 year old female with history of diverticulosis, metastatic breast cancer status postmastectomy, hyperlipidemia, hypothyroid, hypertension, insulin-dependent diabetes mellitus, anxiety, who presents to the emergency department for chief concerns of shortness of breath.   Initial vitals in the emergency department showed temperature of 98, respiration rate of 17, heart rate of 120, blood pressure 107/85, SpO2 of 97% on 2 L nasal cannula.   Serum sodium is 135, potassium 4.2, chloride 100, bicarb 22, BUN of 9, serum creatinine of 0.73, nonfasting blood glucose 240, EGFR greater than 60, WBC 5.5, hemoglobin 12.5, platelets of 484.   High sensitive troponin is 7.  BNP is 45.4.  Lactic acid is 2.6.   ED treatment: DuoNebs one-time dose, sodium chloride 500 mL bolus, azithromycin 500 mg IV, ceftriaxone 2 g IV, Solu-Medrol 125 mg IV.  Assessment & Plan:   Acute hypoxemic respiratory failure (HCC) Most likely secondary to bilateral pleural effusion -could be due to malignancy SIRS -CTA negative for acute PE, showed bilateral pleural effusion right greater than left.   -Chest x-ray from 1/13 does show worsening from before -consider thoracentesis on Monday -IR consulted for thoracentesis but could not drain due to no accessible fluid pocket 1/11 started Lasix 40 mg IV twice daily to diurese.  She is -2 L since admission.  -Continue supplemental oxygen and gradually wean off as per improvement.  -Differentials include atypical pneumonia-She finished Rocephin and azithromycin dose for 5 days. -Continue flutter valve and incentive spirometry. BC and resp panel : negative -COVID, RVP and flu negative   Leg swelling -Ultrasound of the lower extremity negative for DVT  -Leg swelling have improved   Hyponatremia: Sodium trended down from 131-128  today.  Likely due to lasix. Will cont. To monitor.  Musculoskeletal chest pain -going since November since she started coughing- suspect this is secondary to musculoskeletal strain in setting of frequent and persistent cough -cont.Tussionex nightly as needed for cough & Lidocaine patch daily as needed for MSK pain   Metastatic cancer to spine (HCC) - Morphine 4 mg IV every 4 hours as needed for severe pain, 3 doses   Multiple subsegmental pulmonary emboli without acute cor pulmonale (HCC) -cont. Eliquis 5 mg p.o. twice daily   Pathologic compression fracture of vertebra (HCC) -Continue patient follow-up with medical oncology as appropriate   Colovaginal fistula - Continue outpatient follow-up as appropriate   DM (diabetes mellitus), type 2 ([place]) -Patient's NPH of 12 units has been resumed with equivalent  semeglee 12 units daily -Insulin SSI with at bedtime coverage ordered -Goal inpatient blood glucose levels 140-180   Hypertension -Continue home metoprolol succinate 25 mg daily -Hydralazine 5 mg IV every 8 hours as needed for SBP greater than 175, 4 days ordered   Anxiety state -Lorazepam 0.5 mg p.o. every 6 hours as needed for anxiety, 3 doses ordered  DVT prophylaxis: Eliquis Code Status: Full code Family Communication:  None present at bedside.  Plan of care discussed with patient in length and she verbalized understanding and agreed with it. Disposition Plan: Home with home hospice likely 1-2 days  Consultants:  Palliative  Procedures:  None  Antimicrobials:  Rocephin Azithromycin  Status is: Inpatient   Subjective: Patient seen and examined.  Resting comfortably on the bed.  On nasal cannula.  Reports some dizziness and continues to have shortness of breath.  Remained afebrile.  No acute events overnight.  Not feeling comfortable going home until tomorrow.  Reports that she will go home with home hospice to stay with her boyfriend.  All the arrangement have  been done-including home oxygen, hospital bed, bedside commode, rollator, nebulizer have been delivered at her house.  Objective: Vitals:   03/06/22 1618 03/06/22 2046 03/07/22 0625 03/07/22 0745  BP: 102/82 113/73 119/71 121/79  Pulse: 100 96 95 95  Resp: '17 16 16 16  '$ Temp: 98.7 F (37.1 C) 98.2 F (36.8 C) 98 F (36.7 C) 98.2 F (36.8 C)  TempSrc:  Oral Oral   SpO2: 95% 95% 95% 95%  Weight:      Height:        Intake/Output Summary (Last 24 hours) at 03/07/2022 1013 Last data filed at 03/07/2022 0947 Gross per 24 hour  Intake 350 ml  Output 1400 ml  Net -1050 ml   Filed Weights   03/02/22 1735  Weight: 61.4 kg    Examination:  General exam: Appears calm and comfortable, on nasal cannula, communicating well Respiratory system: Decreased breath sounds on the bases.  No wheezing, rhonchi or crackles.  Cardiovascular system: S1 & S2 heard, RRR. No JVD, murmurs, rubs, gallops or clicks. No pedal edema. Gastrointestinal system: Abdomen is nondistended, soft and nontender. No organomegaly or masses felt. Normal bowel sounds heard. Central nervous system: Alert and oriented. No focal neurological deficits. Extremities: Symmetric 5 x 5 power. Skin: No rashes, lesions or ulcers Psychiatry: Judgement and insight appear normal. Mood & affect appropriate.    Data Reviewed: I have personally reviewed following labs and imaging studies  CBC: Recent Labs  Lab 03/02/22 0736 03/03/22 0500 03/05/22 0932 03/06/22 0520 03/07/22 0411  WBC 5.5 7.4 9.8 9.5 8.2  NEUTROABS 3.9  --   --   --   --   HGB 12.5 11.8* 13.6 12.5 11.8*  HCT 39.3 37.1 42.6 38.6 36.3  MCV 92.9 93.5 91.8 92.6 91.0  PLT 484* 467* 429* 371 098   Basic Metabolic Panel: Recent Labs  Lab 03/02/22 0736 03/03/22 0500 03/05/22 0932 03/06/22 0520 03/07/22 0411  NA 135 136 132* 131* 128*  K 4.2 4.6 4.1 4.0 3.5  CL 100 104 92* 93* 89*  CO2 '22 22 26 26 27  '$ GLUCOSE 240* 258* 238* 190* 271*  BUN 9 9 21* 15 15   CREATININE 0.73 0.58 0.64 0.58 0.58  CALCIUM 9.2 8.8* 9.1 8.6* 9.0  MG  --   --  2.5* 2.4  --   PHOS  --   --  3.5 3.6  --    GFR: Estimated Creatinine Clearance: 62.6 mL/min (by C-G formula based on SCr of 0.58 mg/dL). Liver Function Tests: Recent Labs  Lab 03/02/22 0736  AST 83*  ALT 40  ALKPHOS 259*  BILITOT 0.8  PROT 7.2  ALBUMIN 3.0*   No results for input(s): "LIPASE", "AMYLASE" in the last 168 hours. No results for input(s): "AMMONIA" in the last 168 hours. Coagulation Profile: No results for input(s): "INR", "PROTIME" in the last 168 hours. Cardiac Enzymes: No results for input(s): "CKTOTAL", "CKMB", "CKMBINDEX", "TROPONINI" in the last 168 hours. BNP (last 3 results) No results for input(s): "PROBNP" in the last 8760 hours. HbA1C: No results for input(s): "HGBA1C" in the last 72 hours. CBG: Recent Labs  Lab 03/06/22 0749 03/06/22 1241 03/06/22 1615 03/06/22 2230 03/07/22 0748  GLUCAP 201* 312* 187* 159* 223*   Lipid Profile: No results for input(s): "CHOL", "HDL", "LDLCALC", "TRIG", "  CHOLHDL", "LDLDIRECT" in the last 72 hours. Thyroid Function Tests: No results for input(s): "TSH", "T4TOTAL", "FREET4", "T3FREE", "THYROIDAB" in the last 72 hours. Anemia Panel: No results for input(s): "VITAMINB12", "FOLATE", "FERRITIN", "TIBC", "IRON", "RETICCTPCT" in the last 72 hours. Sepsis Labs: Recent Labs  Lab 03/02/22 1104 03/02/22 1310 03/03/22 0500 03/04/22 0609  PROCALCITON  --  0.12 <0.10 <0.10  LATICACIDVEN 2.6* 2.5*  --   --     Recent Results (from the past 240 hour(s))  Blood culture (single)     Status: None   Collection Time: 03/02/22  1:10 PM   Specimen: BLOOD  Result Value Ref Range Status   Specimen Description BLOOD BLOOD LEFT ARM  Final   Special Requests   Final    BOTTLES DRAWN AEROBIC AND ANAEROBIC Blood Culture adequate volume   Culture   Final    NO GROWTH 5 DAYS Performed at [facility] [address] [place],  [place] [address]    Report Status 03/07/2022 FINAL  Final  Culture, blood (single) w Reflex to ID Panel     Status: None   Collection Time: 03/02/22  7:23 PM   Specimen: BLOOD LEFT HAND  Result Value Ref Range Status   Specimen Description BLOOD LEFT HAND  Final   Special Requests   Final    BOTTLES DRAWN AEROBIC AND ANAEROBIC Blood Culture adequate volume   Culture   Final    NO GROWTH 5 DAYS Performed at [facility], [address] [place], [place] [address]    Report Status 03/07/2022 FINAL  Final  Respiratory (~20 pathogens) panel by PCR     Status: None   Collection Time: 03/03/22 12:43 PM   Specimen: Nasopharyngeal Swab; Respiratory  Result Value Ref Range Status   Adenovirus NOT DETECTED NOT DETECTED Final   Coronavirus 229E NOT DETECTED NOT DETECTED Final    Comment: (NOTE) The Coronavirus on the Respiratory Panel, DOES NOT test for the novel  Coronavirus (2019 nCoV)    Coronavirus HKU1 NOT DETECTED NOT DETECTED Final   Coronavirus NL63 NOT DETECTED NOT DETECTED Final   Coronavirus OC43 NOT DETECTED NOT DETECTED Final   Metapneumovirus NOT DETECTED NOT DETECTED Final   Rhinovirus / Enterovirus NOT DETECTED NOT DETECTED Final   Influenza A NOT DETECTED NOT DETECTED Final   Influenza B NOT DETECTED NOT DETECTED Final   Parainfluenza Virus 1 NOT DETECTED NOT DETECTED Final   Parainfluenza Virus 2 NOT DETECTED NOT DETECTED Final   Parainfluenza Virus 3 NOT DETECTED NOT DETECTED Final   Parainfluenza Virus 4 NOT DETECTED NOT DETECTED Final   Respiratory Syncytial Virus NOT DETECTED NOT DETECTED Final   Bordetella pertussis NOT DETECTED NOT DETECTED Final   Bordetella Parapertussis NOT DETECTED NOT DETECTED Final   Chlamydophila pneumoniae NOT DETECTED NOT DETECTED Final   Mycoplasma pneumoniae NOT DETECTED NOT DETECTED Final    Comment: Performed at [facility], [place] [address] [place],  [address]  Resp panel by RT-PCR (RSV, Flu A&B, Covid) Anterior  Nasal Swab     Status: None   Collection Time: 03/05/22 10:44 AM   Specimen: Anterior Nasal Swab  Result Value Ref Range Status   SARS Coronavirus 2 by RT PCR NEGATIVE NEGATIVE Final    Comment: (NOTE) SARS-CoV-2 target nucleic acids are NOT DETECTED.  The SARS-CoV-2 RNA is generally detectable in upper respiratory specimens during the acute phase of infection. The lowest concentration of SARS-CoV-2 viral copies this assay can detect is 138  copies/mL. A negative result does not preclude SARS-Cov-2 infection and should not be used as the sole basis for treatment or other patient management decisions. A negative result may occur with  improper specimen collection/handling, submission of specimen other than nasopharyngeal swab, presence of viral mutation(s) within the areas targeted by this assay, and inadequate number of viral copies(<138 copies/mL). A negative result must be combined with clinical observations, patient history, and epidemiological information. The expected result is Negative.  Fact Sheet for Patients:  EntrepreneurPulse.com.au  Fact Sheet for Healthcare Providers:  IncredibleEmployment.be  This test is no t yet approved or cleared by the [place] FDA and  has been authorized for detection and/or diagnosis of SARS-CoV-2 by FDA under an Emergency Use Authorization (EUA). This EUA will remain  in effect (meaning this test can be used) for the duration of the COVID-19 declaration under Section 564(b)(1) of the Act, 21 U.S.C.section 360bbb-3(b)(1), unless the authorization is terminated  or revoked sooner.       Influenza A by PCR NEGATIVE NEGATIVE Final   Influenza B by PCR NEGATIVE NEGATIVE Final    Comment: (NOTE) The Xpert Xpress SARS-CoV-2/FLU/RSV plus assay is intended as an aid in the diagnosis of influenza from Nasopharyngeal swab specimens and should not be used as a sole basis for treatment. Nasal washings  and aspirates are unacceptable for Xpert Xpress SARS-CoV-2/FLU/RSV testing.  Fact Sheet for Patients: EntrepreneurPulse.com.au  Fact Sheet for Healthcare Providers: IncredibleEmployment.be  This test is not yet approved or cleared by the [place] FDA and has been authorized for detection and/or diagnosis of SARS-CoV-2 by FDA under an Emergency Use Authorization (EUA). This EUA will remain in effect (meaning this test can be used) for the duration of the COVID-19 declaration under Section 564(b)(1) of the Act, 21 U.S.C. section 360bbb-3(b)(1), unless the authorization is terminated or revoked.     Resp Syncytial Virus by PCR NEGATIVE NEGATIVE Final    Comment: (NOTE) Fact Sheet for Patients: EntrepreneurPulse.com.au  Fact Sheet for Healthcare Providers: IncredibleEmployment.be  This test is not yet approved or cleared by the [place] FDA and has been authorized for detection and/or diagnosis of SARS-CoV-2 by FDA under an Emergency Use Authorization (EUA). This EUA will remain in effect (meaning this test can be used) for the duration of the COVID-19 declaration under Section 564(b)(1) of the Act, 21 U.S.C. section 360bbb-3(b)(1), unless the authorization is terminated or revoked.  Performed at [facility], [address] [place], [place] [address]       Radiology Studies: [facility] 1 View  Result Date: 03/06/2022 CLINICAL DATA:  Pleural effusion. EXAM: PORTABLE CHEST 1 VIEW COMPARISON:  Chest x-ray March 02, 2022 FINDINGS: The cardiomediastinal silhouette is stable. Bilateral pulmonary opacities and small bilateral pleural effusions have worsened in the interval. No pneumothorax. No other changes. IMPRESSION: Bilateral pulmonary opacities and small bilateral pleural effusions have worsened in the interval. Electronically Signed   By: Dorise Bullion III M.D.   On: 03/06/2022  10:00    Scheduled Meds:  apixaban  5 mg Oral BID   feeding supplement (GLUCERNA SHAKE)  237 mL Oral TID BM   fluconazole  100 mg Oral Daily   [START ON 03/08/2022] furosemide  40 mg Intravenous Daily   gabapentin  300 mg Oral TID   insulin aspart  0-15 Units Subcutaneous TID WC   insulin aspart  0-5 Units Subcutaneous QHS   insulin aspart  15 Units Intravenous Once   insulin glargine-yfgn  12  Units Subcutaneous Daily   metoprolol succinate  25 mg Oral Daily   mometasone-formoterol  2 puff Inhalation BID   multivitamin with minerals  1 tablet Oral Daily   Continuous Infusions:   LOS: 4 days   Time spent: 35 minutes   Yassmin Binegar Loann Quill, MD [facility]  If 7PM-7AM, please contact night-coverage [email or web address] 03/07/2022, 10:13 AM

## 2022-03-08 ENCOUNTER — Inpatient Hospital Stay: Payer: Medicare Other

## 2022-03-08 ENCOUNTER — Ambulatory Visit: Payer: Medicare Other | Admitting: Family Medicine

## 2022-03-08 DIAGNOSIS — J9601 Acute respiratory failure with hypoxia: Secondary | ICD-10-CM | POA: Diagnosis not present

## 2022-03-08 LAB — BASIC METABOLIC PANEL
Anion gap: 12 (ref 5–15)
BUN: 17 mg/dL (ref 6–20)
CO2: 29 mmol/L (ref 22–32)
Calcium: 9.4 mg/dL (ref 8.9–10.3)
Chloride: 91 mmol/L — ABNORMAL LOW (ref 98–111)
Creatinine, Ser: 0.51 mg/dL (ref 0.44–1.00)
GFR, Estimated: >60 mL/min (ref >60)
Glucose, Bld: 178 mg/dL — ABNORMAL HIGH (ref 70–99)
Potassium: 3.3 mmol/L — ABNORMAL LOW (ref 3.5–5.1)
Sodium: 132 mmol/L — ABNORMAL LOW (ref 135–145)

## 2022-03-08 LAB — GLUCOSE, CAPILLARY
Glucose-Capillary: 215 mg/dL — ABNORMAL HIGH (ref 70–99)
Glucose-Capillary: 299 mg/dL — ABNORMAL HIGH (ref 70–99)
Glucose-Capillary: 195 mg/dL — ABNORMAL HIGH (ref 70–99)
Glucose-Capillary: 365 mg/dL — ABNORMAL HIGH (ref 70–99)

## 2022-03-08 LAB — OSMOLALITY: Osmolality: 293 mOsm/kg (ref 275–295)

## 2022-03-08 LAB — MAGNESIUM: Magnesium: 2.5 mg/dL — ABNORMAL HIGH (ref 1.7–2.4)

## 2022-03-08 MED ORDER — OXYMETAZOLINE HCL 0.05 % NA SOLN: 1.0000 | Freq: Two times a day (BID) | NASAL | Status: DC | PRN

## 2022-03-08 MED ORDER — SALINE SPRAY 0.65 % NA SOLN: 1.0000 | NASAL | Status: DC | PRN

## 2022-03-08 MED ORDER — POTASSIUM CHLORIDE CRYS ER 20 MEQ PO TBCR
40.0000 meq | EXTENDED_RELEASE_TABLET | Freq: Once | ORAL | Status: AC
  Administered 2022-03-08: 40 meq via ORAL
  Filled 2022-03-08: qty 2

## 2022-03-08 NOTE — Care Management Important Message (Signed)
Important Message  Patient Details  Name: Emily Wilson MRN: [id number] Date of Birth: May 28, 1965   Medicare Important Message Given:  Other (see comment)  Patient is discharging home with Hospice. Out of respect for the patient and family no Important Message from [facility] given.   Emily Wilson 03/08/2022, 8:40 AM

## 2022-03-08 NOTE — Progress Notes (Signed)
[facility] Progress Note  Patient: Emily Wilson    YSA:[id number]  DOA: 03/02/2022     Date of Service: the patient was seen and examined on 03/08/2022  Chief Complaint  Patient presents with   Rectal Bleeding   Brief hospital course: [name] is a 57 year old female with history of diverticulosis, metastatic breast cancer status postmastectomy, hyperlipidemia, hypothyroid, hypertension, insulin-dependent diabetes mellitus, anxiety, who presents to the emergency department for chief concerns of shortness of breath.   Initial vitals in the emergency department showed temperature of 98, respiration rate of 17, heart rate of 120, blood pressure 107/85, SpO2 of 97% on 2 L nasal cannula. Serum sodium is 135, potassium 4.2, chloride 100, bicarb 22, BUN of 9, serum creatinine of 0.73, nonfasting blood glucose 240, EGFR greater than 60, WBC 5.5, hemoglobin 12.5, platelets of 484. High sensitive troponin is 7.  BNP is 45.4.  Lactic acid is 2.6. ED treatment: DuoNebs one-time dose, sodium chloride 500 mL bolus, azithromycin 500 mg IV, ceftriaxone 2 g IV, Solu-Medrol 125 mg IV.    Assessment and Plan: Principal Problem:   Acute hypoxemic respiratory failure (HCC) Active Problems:   SIRS (systemic inflammatory response syndrome) (HCC)   Anxiety state   Hypertension   Malignant neoplasm of lower-inner quadrant of right breast of female, estrogen receptor positive (HCC)   DM (diabetes mellitus), type 2 (HCC)   HLD (hyperlipidemia)   S/P mastectomy, right   Colovaginal fistula   Pathologic compression fracture of vertebra (HCC)   Multiple subsegmental pulmonary emboli without acute cor pulmonale (HCC)   Metastatic cancer to spine [facility])   Musculoskeletal chest pain   Leg swelling  Assessment and Plan:    Acute hypoxemic respiratory failure (HCC) Most likely secondary to bilateral pleural effusion -could be due to malignancy SIRS -CTA negative for acute PE, showed  bilateral pleural effusion right greater than left.   -Chest x-ray from 1/13 does show worsening from before  -IR consulted for thoracentesis but could not drain due to no accessible fluid pocket 1/11 started Lasix 40 mg IV twice daily to diurese.  She is -2 L since admission.  -Continue supplemental oxygen and gradually wean off as per improvement.  -Differentials include atypical pneumonia-She finished Rocephin and azithromycin dose for 5 days. -Continue flutter valve and incentive spirometry. BC and resp panel : negative -COVID, RVP and flu negative 1/15 f/u IR for thoracentesis, patient agreed with the procedure.  Hypokalemia most likely due to IV Lasix. Potassium repleted.  Monitor and replete as needed.   Hyponatremia: Most likely due to diuresis and malignancy/SIADH Sodium level is fluctuating, continue to monitor daily.   Na 132 Continue to monitor daily Check serum osmolarity  Musculoskeletal chest pain -going since November since she started coughing- suspect this is secondary to musculoskeletal strain in setting of frequent and persistent cough -cont.Tussionex nightly as needed for cough & Lidocaine patch daily as needed for MSK pain   Metastatic cancer to spine  - Morphine 4 mg IV every 4 hours as needed for severe pain, 3 doses   Multiple subsegmental pulmonary emboli without acute cor pulmonale  - Resumed home Eliquis 5 mg p.o. twice daily   Pathologic compression fracture of vertebra - Continue patient follow-up with medical oncology as appropriate   Colovaginal fistula - Continue outpatient follow-up as appropriate   DM (diabetes mellitus), type 2 - Patient's NPH of 12 units has been resumed with equivalent  semeglee 12 units daily - Insulin SSI with at  bedtime coverage ordered - Goal inpatient blood glucose levels 140-180   Hypertension - Resumed home metoprolol succinate 25 mg daily - Hydralazine 5 mg IV every 8 hours as needed for SBP greater than 175, 4  days ordered   Anxiety state - Lorazepam 0.5 mg p.o. every 6 hours as needed for anxiety, 3 doses ordered  Leg swelling, Resolved  Venous duplex negative for DVT  Oral thrush, patient was having some whitish spot and some difficulty swallowing. Started Diflucan 100 mg p.o. daily for 7 days   Body mass index is 27.34 kg/m.  Interventions:  Palliative care consulted for goals of care discussion, patient remained full code. Hospice care was consulted, patient will continue hospice care at home after discharge.     Diet: Heart healthy/carb modified DVT Prophylaxis: Therapeutic Anticoagulation with Eliquis    Advance goals of care discussion: Full code  Family Communication: family was not present at bedside, at the time of interview.  The pt provided permission to discuss medical plan with the family. Opportunity was given to ask question and all questions were answered satisfactorily.   Disposition:  Pt is from Home, admitted with respiratory failure, bilateral pleural effusion, may needs thoracentesis and still on oxygen, which precludes a safe discharge. Discharge to home with hospice care,  when clinically stable. 1/15 plan for thoracentesis today.    Subjective: No significant overnight events, patient was complaining of persistent cough and pain in the lower ribs due to coughing.  Patient was not taking as needed medication for cough which was encouraged.  Patient says she is not having a lot of phlegm.  Still has shortness of breath and agreed for thoracentesis.   Physical Exam: General: NAD, lying comfortably, mild SOB, mild dry cough Appear in no distress, affect appropriate Eyes: PERRLA ENT: Oral Mucosa Clear, moist  Neck: no JVD,  Cardiovascular: S1 and S2 Present, no Murmur,  Respiratory: good respiratory effort, Bilateral Air entry equal and Decreased, mild bibasilar crackles crackles, no wheezes Abdomen: Bowel Sound present, Soft and no tenderness,  Skin: no  rashes Extremities: no Pedal edema, no calf tenderness Neurologic: without any new focal findings Gait not checked due to patient safety concerns  Vitals:   03/07/22 1611 03/07/22 2040 03/08/22 0632 03/08/22 0800  BP: 118/81 119/81 118/71 125/82  Pulse: (!) 104 (!) 101 95 95  Resp: '16 16 17 14  '$ Temp: 98.7 F (37.1 C) 98.4 F (36.9 C) 98 F (36.7 C) 98.4 F (36.9 C)  TempSrc:   Oral   SpO2: 94% 95% 94% 92%  Weight:      Height:        Intake/Output Summary (Last 24 hours) at 03/08/2022 1149 Last data filed at 03/08/2022 1100 Gross per 24 hour  Intake --  Output 850 ml  Net -850 ml   Filed Weights   03/02/22 1735  Weight: 61.4 kg    Data Reviewed: I have personally reviewed and interpreted daily labs, tele strips, imagings as discussed above. I reviewed all nursing notes, pharmacy notes, vitals, pertinent old records I have discussed plan of care as described above with RN and patient/family.  CBC: Recent Labs  Lab 03/02/22 0736 03/03/22 0500 03/05/22 0932 03/06/22 0520 03/07/22 0411  WBC 5.5 7.4 9.8 9.5 8.2  NEUTROABS 3.9  --   --   --   --   HGB 12.5 11.8* 13.6 12.5 11.8*  HCT 39.3 37.1 42.6 38.6 36.3  MCV 92.9 93.5 91.8 92.6 91.0  PLT 484*  467* 429* 371 734   Basic Metabolic Panel: Recent Labs  Lab 03/03/22 0500 03/05/22 0932 03/06/22 0520 03/07/22 0411 03/08/22 0253  NA 136 132* 131* 128* 132*  K 4.6 4.1 4.0 3.5 3.3*  CL 104 92* 93* 89* 91*  CO2 '22 26 26 27 29  '$ GLUCOSE 258* 238* 190* 271* 178*  BUN 9 21* '15 15 17  '$ CREATININE 0.58 0.64 0.58 0.58 0.51  CALCIUM 8.8* 9.1 8.6* 9.0 9.4  MG  --  2.5* 2.4  --  2.5*  PHOS  --  3.5 3.6  --   --     Studies: No results found.  Scheduled Meds:  apixaban  5 mg Oral BID   feeding supplement (GLUCERNA SHAKE)  237 mL Oral TID BM   fluconazole  100 mg Oral Daily   furosemide  40 mg Intravenous BID   gabapentin  300 mg Oral TID   insulin aspart  0-15 Units Subcutaneous TID WC   insulin aspart  0-5 Units  Subcutaneous QHS   insulin aspart  15 Units Intravenous Once   insulin glargine-yfgn  12 Units Subcutaneous Daily   metoprolol succinate  25 mg Oral Daily   mometasone-formoterol  2 puff Inhalation BID   multivitamin with minerals  1 tablet Oral Daily   Continuous Infusions:   PRN Meds: alum & mag hydroxide-simeth, benzonatate, ipratropium-albuterol, lidocaine, LORazepam, menthol-cetylpyridinium, morphine injection, oxymetazoline, senna-docusate, sodium chloride  Time spent: 50 minutes  Author: Val Wilson. MD [facility] 03/08/2022 11:49 AM  To reach On-call, see care teams to locate the attending and reach out to them via [email or web address]. If 7PM-7AM, please contact night-coverage If you still have difficulty reaching the attending provider, please page the [facility] (Director on Call) for [facility] on amion for assistance.

## 2022-03-08 NOTE — Inpatient Diabetes Management (Addendum)
Inpatient Diabetes Program Recommendations  AACE/ADA: New Consensus Statement on Inpatient Glycemic Control (2015)  Target Ranges:  Prepandial:   less than 140 mg/dL      Peak postprandial:   less than 180 mg/dL (1-2 hours)      Critically ill patients:  140 - 180 mg/dL   Lab Results  Component Value Date   GLUCAP 215 (H) 03/08/2022   HGBA1C 9.8 (A) 02/25/2022    Review of Glycemic Control  Latest Reference Range & Units 03/07/22 07:48 03/07/22 11:43 03/07/22 11:46 03/07/22 16:10 03/07/22 21:29 03/08/22 08:03  Glucose-Capillary 70 - 99 mg/dL 223 (H) 410 (H) 392 (H) 167 (H) 288 (H) 215 (H)   Diabetes history: DM 2 Outpatient Diabetes medications: NPH 12 units Daily, Regular insulin 12 units tid meal coverage Current orders for Inpatient glycemic control:  Semglee 12 units Daily Novolog 0-15 units tid + hs  Glucerna tid between meals  A1c increased: 9.8% on 1/4 At PCP visit (on steroids with cancer treatments) 8.9% on 12/4  Inpatient Diabetes Program Recommendations:    Glucose trends increase after meal/supplement intake  -   add Novolog 4 units tid meal coverage if eating >50% of meals  Addendum: Spoke with pt over the phone regarding A1c of 9.8%. Pt reports being on 5 rounds of steroids since September. Pt also reports she found the NPH insulin dose not last a full 24 hours and she splits the dose and takes it bid while on steroids. Pt also mentions she tried to titrate her insulin when she is on steroids. I suggested for her to include her PCP in formulating a plan for insulin titration during periods of time when she is on steroids. Pt reports her glucose trends increase into the 400 range while on steroids. Discussed back up insulin at [facility] if needed.   Thanks,  Tama Headings RN, MSN, BC-ADM Inpatient Diabetes Coordinator Team Pager [phone] (8a-5p)

## 2022-03-08 NOTE — Plan of Care (Signed)

## 2022-03-08 NOTE — Progress Notes (Signed)
Pt was brought to [place] for therapeutic thoracentesis. Upon US examination, there was no fluid on L and a very small fluid pocket on R that was not large enough to safely access.  Exam was ended and explained to patient.  Patient was in agreement with findings.     Narda Rutherford, AGNP-BC 03/08/2022, 3:52 PM

## 2022-03-09 DIAGNOSIS — J9601 Acute respiratory failure with hypoxia: Secondary | ICD-10-CM | POA: Diagnosis not present

## 2022-03-09 LAB — BASIC METABOLIC PANEL
Anion gap: 12 (ref 5–15)
BUN: 24 mg/dL — ABNORMAL HIGH (ref 6–20)
CO2: 27 mmol/L (ref 22–32)
Calcium: 9.4 mg/dL (ref 8.9–10.3)
Chloride: 91 mmol/L — ABNORMAL LOW (ref 98–111)
Creatinine, Ser: 0.58 mg/dL (ref 0.44–1.00)
GFR, Estimated: >60 mL/min (ref >60)
Glucose, Bld: 202 mg/dL — ABNORMAL HIGH (ref 70–99)
Potassium: 3.7 mmol/L (ref 3.5–5.1)
Sodium: 130 mmol/L — ABNORMAL LOW (ref 135–145)

## 2022-03-09 LAB — CBC
HCT: 37.8 % (ref 36.0–46.0)
Hemoglobin: 12.5 g/dL (ref 12.0–15.0)
MCH: 29.6 pg (ref 26.0–34.0)
MCHC: 33.1 g/dL (ref 30.0–36.0)
MCV: 89.4 fL (ref 80.0–100.0)
Platelets: 385 10*3/uL (ref 150–400)
RBC: 4.23 MIL/uL (ref 3.87–5.11)
RDW: 14.5 % (ref 11.5–15.5)
WBC: 7.6 10*3/uL (ref 4.0–10.5)
nRBC: 1.8 % — ABNORMAL HIGH (ref 0.0–0.2)

## 2022-03-09 LAB — GLUCOSE, CAPILLARY
Glucose-Capillary: 332 mg/dL — ABNORMAL HIGH (ref 70–99)
Glucose-Capillary: 186 mg/dL — ABNORMAL HIGH (ref 70–99)

## 2022-03-09 LAB — PHOSPHORUS: Phosphorus: 5.2 mg/dL — ABNORMAL HIGH (ref 2.5–4.6)

## 2022-03-09 LAB — MAGNESIUM: Magnesium: 2.2 mg/dL (ref 1.7–2.4)

## 2022-03-09 MED ORDER — FUROSEMIDE 40 MG PO TABS: 40.0000 mg | ORAL_TABLET | Freq: Every day | ORAL | 11 refills | Status: AC

## 2022-03-09 MED ORDER — OXYMETAZOLINE HCL 0.05 % NA SOLN: 1.0000 | Freq: Two times a day (BID) | NASAL | 0 refills | Status: AC | PRN

## 2022-03-09 MED ORDER — SALINE SPRAY 0.65 % NA SOLN: 1.0000 | NASAL | 0 refills | Status: AC | PRN

## 2022-03-09 MED ORDER — SODIUM CHLORIDE 1 G PO TABS
1.0000 g | ORAL_TABLET | Freq: Three times a day (TID) | ORAL | Status: DC
  Administered 2022-03-09: 1 g via ORAL
  Filled 2022-03-09: qty 1

## 2022-03-09 MED ORDER — SODIUM CHLORIDE 1 G PO TABS: 1.0000 g | ORAL_TABLET | Freq: Three times a day (TID) | ORAL | 0 refills | Status: AC

## 2022-03-09 MED ORDER — BENZONATATE 100 MG PO CAPS: 100.0000 mg | ORAL_CAPSULE | Freq: Three times a day (TID) | ORAL | 0 refills | Status: AC | PRN

## 2022-03-09 NOTE — Inpatient Diabetes Management (Signed)
Inpatient Diabetes Program Recommendations  AACE/ADA: New Consensus Statement on Inpatient Glycemic Control (2015)  Target Ranges:  Prepandial:   less than 140 mg/dL      Peak postprandial:   less than 180 mg/dL (1-2 hours)      Critically ill patients:  140 - 180 mg/dL   Lab Results  Component Value Date   GLUCAP 365 (H) 03/08/2022   HGBA1C 9.8 (A) 02/25/2022    Review of Glycemic Control  Latest Reference Range & Units 03/07/22 07:48 03/07/22 11:43 03/07/22 11:46 03/07/22 16:10 03/07/22 21:29 03/08/22 08:03  Glucose-Capillary 70 - 99 mg/dL 223 (H) 410 (H) 392 (H) 167 (H) 288 (H) 215 (H)    Latest Reference Range & Units 03/08/22 08:03 03/08/22 11:46 03/08/22 16:48 03/08/22 19:47  Glucose-Capillary 70 - 99 mg/dL 215 (H) 299 (H) 195 (H) 365 (H)   Diabetes history: DM 2 Outpatient Diabetes medications: NPH 12 units Daily, Regular insulin 12 units tid meal coverage Current orders for Inpatient glycemic control:  Semglee 12 units Daily Novolog 0-15 units tid + hs  Glucerna tid between meals  A1c increased: 9.8% on 1/4 At PCP visit (on steroids with cancer treatments) 8.9% on 12/4  Inpatient Diabetes Program Recommendations:    Glucose trends increase after meal/supplement intake  -   add Novolog 4 units tid meal coverage if eating >50% of meals -   increase Semglee to 15 units    Thanks,  Tama Headings RN, MSN, BC-ADM Inpatient Diabetes Coordinator Team Pager [phone] (8a-5p)

## 2022-03-09 NOTE — Discharge Summary (Signed)
[facility] Discharge Summary   Patient: Emily Wilson TAV:[id number]  PCP: Dorna Mai, MD  Date of admission: 03/02/2022   Date of discharge:  03/09/2022     Discharge Diagnoses:  Principal Problem:   Acute hypoxemic respiratory failure ([place]) Active Problems:   SIRS (systemic inflammatory response syndrome) ([place])   Anxiety state   Hypertension   Malignant neoplasm of lower-inner quadrant of right breast of female, estrogen receptor positive ([place])   DM (diabetes mellitus), type 2 ([place])   HLD (hyperlipidemia)   S/P mastectomy, right   Colovaginal fistula   Pathologic compression fracture of vertebra (HCC)   Multiple subsegmental pulmonary emboli without acute cor pulmonale (HCC)   Metastatic cancer to spine [facility])   Musculoskeletal chest pain   Leg swelling   Admitted From: Home Disposition:  Home with hospice services  Recommendations for Outpatient Follow-up:  PCP: In 1 week Follow-up with hospice services at home Follow up LABS/TEST: BMP after 1 week   Diet recommendation: Regular diet  Activity: The patient is advised to gradually reintroduce usual activities, as tolerated  Discharge Condition: stable  Code Status: Full code   History of present illness: As per the H and P dictated on admission Hospital Course:  [name] is a 57 year old female with history of diverticulosis, metastatic breast cancer status postmastectomy, hyperlipidemia, hypothyroid, hypertension, insulin-dependent diabetes mellitus, anxiety, who presents to the emergency department for chief concerns of shortness of breath. Initial vitals in the emergency department showed temperature of 98, respiration rate of 17, heart rate of 120, blood pressure 107/85, SpO2 of 97% on 2 L nasal cannula. Serum sodium is 135, potassium 4.2, chloride 100, bicarb 22, BUN of 9, serum creatinine of 0.73, nonfasting blood glucose 240, EGFR greater than 60, WBC 5.5, hemoglobin 12.5, platelets of  484. High sensitive troponin is 7.  BNP is 45.4.  Lactic acid is 2.6. ED treatment: DuoNebs one-time dose, sodium chloride 500 mL bolus, azithromycin 500 mg IV, ceftriaxone 2 g IV, Solu-Medrol 125 mg IV.    Assessment and Plan: # Acute hypoxemic respiratory failure ([place]) Most likely secondary to bilateral pleural effusion -could be due to malignancy SIRS. CTA negative for acute PE, showed bilateral pleural effusion right greater than left.  Chest x-ray from 1/13 does show worsening from before  IR consulted for thoracentesis but could not drain due to no accessible fluid pocket 1/11 started Lasix 40 mg IV twice daily to diurese.  She is -2 L since admission.  Continue supplemental oxygen and gradually wean off as per improvement.  Differentials include atypical pneumonia-She finished Rocephin and azithromycin dose for 5 days. Continue flutter valve and incentive spirometry. BC and resp panel : negative. COVID, RVP and flu negative. On 1/15 ultrasound-guided thoracentesis was ordered but could not be done due to very little amount of fluid.  Patient was advised to continue Lasix 40 mg p.o. daily and follow with hospice care and PCP.   # Hypokalemia most likely due to IV Lasix. Potassium repleted.  Resolved # Hyponatremia: Most likely due to diuresis and malignancy/SIADH Sodium level is fluctuating, Na 132--130, serum 70-93, isotonic hyponatremia.  Started sodium chloride 1 g p.o. 3 times daily for 5 days.  Repeat BMP after 1 week and follow-up with PCP for further management. # Musculoskeletal chest pain, going since November since she started coughing- suspect this is secondary to musculoskeletal strain in setting of frequent and persistent cough.  Continue Tessalon Perles as needed for cough.  Continue as needed  treatment for pain control by hospice. # Metastatic cancer to spine, Morphine 4 mg IV every 4 hours PRN was ordered but patient did not receive any dose in the past few days.  Continue pain  management as per hospice care at home. # Pathologic compression fracture of vertebra Continue patient follow-up with medical oncology as appropriate # Multiple subsegmental pulmonary emboli without acute cor pulmonale  Resumed home Eliquis 5 mg p.o. twice daily # Colovaginal fistula, Continue outpatient follow-up as appropriate # DM (diabetes mellitus), type 2, resumed home regimen of insulin, monitor FSBG, continue diet control. # Hypertension, Resumed home metoprolol succinate 25 mg daily.  # Anxiety state, continue supportive care and further management as per hospice care. # Leg swelling, Resolved,Venous duplex negative for DVT.  Patient is already on Eliquis for PE. # Oral thrush, patient was having some whitish spot and some difficulty swallowing. S/p Diflucan 100 mg p.o. daily for 7 days  Body mass index is 27.34 kg/m.  Nutrition Problem: Increased nutrient needs Etiology: chronic illness (metastatic breast cancer) Nutrition Interventions: Interventions: Glucerna shake, MVI  - Patient was instructed, not to drive, operate heavy machinery, perform activities at heights, swimming or participation in water activities or provide baby sitting services while on Pain, Sleep and Anxiety Medications; until her outpatient Physician has advised to do so again.  - Also recommended to not to take more than prescribed Pain, Sleep and Anxiety Medications.  Patient was ambulatory without any assistance. On the day of the discharge the patient's vitals were stable, and no other acute medical condition were reported by patient. the patient was felt safe to be discharge at Home with hospice services.  Consultants: Care Procedures: None  Discharge Exam: General: Appear in no distress, no Rash; Oral Mucosa Clear, moist. Cardiovascular: S1 and S2 Present, no Murmur, Respiratory: normal respiratory effort, Bilateral Air entry present and no Crackles, no wheezes Abdomen: Bowel Sound present, Soft  and no tenderness, no hernia Extremities: no Pedal edema, no calf tenderness Neurology: alert and oriented to time, place, and person affect appropriate.  Filed Weights   03/02/22 1735  Weight: 61.4 kg   Vitals:   03/09/22 0427 03/09/22 0806  BP: 131/74 125/84  Pulse: 92 91  Resp: 18 20  Temp: 98.3 F (36.8 C) 97.8 F (36.6 C)  SpO2: 93% 97%    DISCHARGE MEDICATION: Allergies as of 03/09/2022       Reactions   Doxycycline Shortness Of Breath   Wheezing, shortness of breath, rash head to toe, and swelling   Naproxen Shortness Of Breath   Oxycodone-acetaminophen Itching   Codeine Hives   Hydrocodone-acetaminophen Hives   Ibuprofen    Irritant to stomach r/t diverticulitis   Lantus [insulin Glargine]    Yeast Infections   Meloxicam Other (See Comments)   Abdominal pain   Propoxyphene N-acetaminophen Hives   Sulfa Antibiotics Hives   Sulfonamide Derivatives Hives   Tramadol Other (See Comments)   Abdominal Pain        Medication List     STOP taking these medications    cyclobenzaprine 5 MG tablet Commonly known as: FLEXERIL   diclofenac 75 MG EC tablet Commonly known as: VOLTAREN   guaiFENesin 100 MG/5ML liquid Commonly known as: ROBITUSSIN   Incruse Ellipta 62.5 MCG/ACT Aepb Generic drug: umeclidinium bromide   methocarbamol 750 MG tablet Commonly known as: Robaxin-750   nitrofurantoin (macrocrystal-monohydrate) 100 MG capsule Commonly known as: MACROBID   ondansetron 8 MG disintegrating tablet Commonly known as: Zofran  ODT   rosuvastatin 40 MG tablet Commonly known as: CRESTOR   Xgeva 120 MG/1.7ML Soln injection Generic drug: denosumab       TAKE these medications    Accu-Chek FastClix Lancets Misc USE TO TEST BLOOD SUGAR UP TO FOUR TIMES DAILY AS DIRECTED   Accu-Chek Guide test strip Generic drug: glucose blood USE TO CHECK BLOOD SUGAR UP TO 4 TIMES A DAY (E11.65)   albuterol 108 (90 Base) MCG/ACT inhaler Commonly known as:  VENTOLIN HFA Inhale 1-2 puffs into the lungs every 6 (six) hours as needed for wheezing or shortness of breath.   apixaban 5 MG Tabs tablet Commonly known as: ELIQUIS Take 2 tablets (10 mg total) by mouth 2 (two) times daily. From 02/15/22 take 5 mg twice a day   BD Insulin Syringe U/F 31G X 5/16" 0.5 ML Misc Generic drug: Insulin Syringe-Needle U-100 USE TO ADMINISTER INSULIN 3 TIMES DAILY WITH MEALS   benzonatate 100 MG capsule Commonly known as: TESSALON Take 1 capsule (100 mg total) by mouth 3 (three) times daily as needed for cough.   Combivent Respimat 20-100 MCG/ACT Aers respimat Generic drug: Ipratropium-Albuterol Inhale 1 puff into the lungs every 6 (six) hours.   dexamethasone 0.5 MG tablet Commonly known as: DECADRON Take 1 tablet (0.5 mg total) by mouth daily.   fluticasone-salmeterol 250-50 MCG/ACT Aepb Commonly known as: Advair Diskus Inhale 1 puff into the lungs in the morning and at bedtime.   furosemide 40 MG tablet Commonly known as: Lasix Take 1 tablet (40 mg total) by mouth daily.   gabapentin 100 MG capsule Commonly known as: NEURONTIN Take 3 capsules (300 mg total) by mouth 3 (three) times daily.   insulin NPH Human 100 UNIT/ML injection Commonly known as: NovoLIN N INJECT 12 UNITS SUBCUTANEOUSLY DAILY   insulin regular 100 units/mL injection Commonly known as: NovoLIN R INJECT 12 UNITS TOTAL INTO THE SKIN 3X DAILY BEFORE MEALS. HOLD DOSE FOR BLOOD SUGAR LESS THAN 125   metoprolol succinate 25 MG 24 hr tablet Commonly known as: TOPROL-XL Take 1 tablet (25 mg total) by mouth daily.   oxymetazoline 0.05 % nasal spray Commonly known as: AFRIN Place 1 spray into both nostrils 2 (two) times daily as needed for congestion (and nasal bleeding).   sodium chloride 0.65 % Soln nasal spray Commonly known as: OCEAN Place 1 spray into both nostrils as needed for congestion.   sodium chloride 1 g tablet Take 1 tablet (1 g total) by mouth 3 (three) times  daily with meals for 5 days.       Allergies  Allergen Reactions   Doxycycline Shortness Of Breath    Wheezing, shortness of breath, rash head to toe, and swelling   Naproxen Shortness Of Breath   Oxycodone-Acetaminophen Itching   Codeine Hives   Hydrocodone-Acetaminophen Hives   Ibuprofen     Irritant to stomach r/t diverticulitis   Lantus [Insulin Glargine]     Yeast Infections   Meloxicam Other (See Comments)    Abdominal pain    Propoxyphene N-Acetaminophen Hives   Sulfa Antibiotics Hives   Sulfonamide Derivatives Hives   Tramadol Other (See Comments)    Abdominal Pain   Discharge Instructions     Call MD for:  difficulty breathing, headache or visual disturbances   Complete by: As directed    Call MD for:  extreme fatigue   Complete by: As directed    Call MD for:  persistant dizziness or light-headedness   Complete by: As  directed    Call MD for:  persistant nausea and vomiting   Complete by: As directed    Call MD for:  severe uncontrolled pain   Complete by: As directed    Call MD for:  temperature >100.4   Complete by: As directed    Diet - low sodium heart healthy   Complete by: As directed    Discharge instructions   Complete by: As directed    Follow-up with PCP and hospice care for further management at home.  Pete BMP after 1 week to check electrolytes as patient is on Lasix for pleural effusion and also started sodium chloride tablets for hyponatremia.   Increase activity slowly   Complete by: As directed        The results of significant diagnostics from this hospitalization (including imaging, microbiology, ancillary and laboratory) are listed below for reference.    Significant Diagnostic Studies: [place] CHEST (PLEURAL EFFUSION)  Result Date: 03/09/2022 CLINICAL DATA:  57 year old woman with pleural effusion presents to IR for thoracentesis EXAM: CHEST ULTRASOUND COMPARISON:  Chest radiograph 03/06/2022 FINDINGS: Sonographic evaluation of the  chest demonstrates minimal bilateral pleural effusions which are too small for percutaneous drainage. IMPRESSION: Minimal bilateral pleural effusions, too small for percutaneous drainage. Electronically Signed   By: Miachel Roux M.D.   On: 03/09/2022 07:51   DG Chest Port 1 View  Result Date: 03/06/2022 CLINICAL DATA:  Pleural effusion. EXAM: PORTABLE CHEST 1 VIEW COMPARISON:  Chest x-ray March 02, 2022 FINDINGS: The cardiomediastinal silhouette is stable. Bilateral pulmonary opacities and small bilateral pleural effusions have worsened in the interval. No pneumothorax. No other changes. IMPRESSION: Bilateral pulmonary opacities and small bilateral pleural effusions have worsened in the interval. Electronically Signed   By: Dorise Bullion III M.D.   On: 03/06/2022 10:00   [place] CHEST (PLEURAL EFFUSION)  Result Date: 03/03/2022 CLINICAL DATA:  Pleural effusions EXAM: CHEST ULTRASOUND COMPARISON:  CT from previous day FINDINGS: Bilateral pleural effusions right greater than left confirmed. No large right-sided pocket for safe thoracentesis was identified. IMPRESSION: Bilateral pleural effusions, right greater than left. Thoracentesis deferred. Electronically Signed   By: Lucrezia Europe M.D.   On: 03/03/2022 15:00   US Venous Img Lower Bilateral (DVT)  Result Date: 03/02/2022 CLINICAL DATA:  Pain and swelling EXAM: BILATERAL LOWER EXTREMITY VENOUS DOPPLER ULTRASOUND TECHNIQUE: Gray-scale sonography with graded compression, as well as color Doppler and duplex ultrasound were performed to evaluate the lower extremity deep venous systems from the level of the common femoral vein and including the common femoral, femoral, profunda femoral, popliteal and calf veins including the posterior tibial, peroneal and gastrocnemius veins when visible. The superficial great saphenous vein was also interrogated. Spectral Doppler was utilized to evaluate flow at rest and with distal augmentation maneuvers in the common femoral,  femoral and popliteal veins. COMPARISON:  None Available. FINDINGS: RIGHT LOWER EXTREMITY Common Femoral Vein: No evidence of thrombus. Normal compressibility, respiratory phasicity and response to augmentation. Saphenofemoral Junction: No evidence of thrombus. Normal compressibility and flow on color Doppler imaging. Profunda Femoral Vein: No evidence of thrombus. Normal compressibility and flow on color Doppler imaging. Femoral Vein: No evidence of thrombus. Normal compressibility, respiratory phasicity and response to augmentation. Popliteal Vein: No evidence of thrombus. Normal compressibility, respiratory phasicity and response to augmentation. Calf Veins: No evidence of thrombus. Normal compressibility and flow on color Doppler imaging. Superficial Great Saphenous Vein: No evidence of thrombus. Normal compressibility. Venous Reflux:  None. Other Findings:  None. LEFT LOWER  EXTREMITY Common Femoral Vein: No evidence of thrombus. Normal compressibility, respiratory phasicity and response to augmentation. Saphenofemoral Junction: No evidence of thrombus. Normal compressibility and flow on color Doppler imaging. Profunda Femoral Vein: No evidence of thrombus. Normal compressibility and flow on color Doppler imaging. Femoral Vein: No evidence of thrombus. Normal compressibility, respiratory phasicity and response to augmentation. Popliteal Vein: No evidence of thrombus. Normal compressibility, respiratory phasicity and response to augmentation. Calf Veins: No evidence of thrombus. Normal compressibility and flow on color Doppler imaging. Superficial Great Saphenous Vein: No evidence of thrombus. Normal compressibility. Venous Reflux:  None. Other Findings:  None. IMPRESSION: No evidence of deep venous thrombosis in either lower extremity. Electronically Signed   By: Sammie Bench M.D.   On: 03/02/2022 15:56   CT Angio Chest PE W and/or Wo Contrast  Result Date: 03/02/2022 CLINICAL DATA:  Pulmonary embolism  (PE) suspected, high prob; Abdominal pain, acute, nonlocalized. Metastatic breast cancer EXAM: CT ANGIOGRAPHY CHEST CT ABDOMEN AND PELVIS WITH CONTRAST TECHNIQUE: Multidetector CT imaging of the chest was performed using the standard protocol during bolus administration of intravenous contrast. Multiplanar CT image reconstructions and MIPs were obtained to evaluate the vascular anatomy. Multidetector CT imaging of the abdomen and pelvis was performed using the standard protocol during bolus administration of intravenous contrast. RADIATION DOSE REDUCTION: This exam was performed according to the departmental dose-optimization program which includes automated exposure control, adjustment of the mA and/or kV according to patient size and/or use of iterative reconstruction technique. CONTRAST:  140m OMNIPAQUE IOHEXOL 350 MG/ML SOLN COMPARISON:  CT 02/05/2022 FINDINGS: CTA CHEST FINDINGS Cardiovascular: Satisfactory opacification of the pulmonary arteries to the segmental branch level. 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 No new pulmonary arterial filling defects to suggest acute PE. No right heart strain. Thoracic aorta is nonaneurysmal. Scattered atherosclerotic vascular calcifications of the aorta and coronary arteries. Normal heart size. No pericardial effusion. Mediastinum/Nodes: Similar degree of soft tissue density throughout the mediastinum which may represent post radiation changes. No overt adenopathy. Thyroid, trachea, and esophagus demonstrate no significant findings. Lungs/Pleura: Enlarging small to moderate bilateral pleural effusions with associated compressive atelectasis. Diffuse interlobular septal thickening and ground-glass attenuation throughout the lung fields. No new or enlarging pulmonary nodules are identified within the limitations of this exam. Musculoskeletal: Widespread mixed lytic  and sclerotic osseous metastatic disease of the visualized axial and appendicular skeleton. Numerous bilateral pathologic rib fractures. Progressive height loss of a pathologic fracture involving the superior endplate of TL38 New nondisplaced pathologic fracture of the T4 spinous process. Review of the MIP images confirms the above findings. CT ABDOMEN and PELVIS FINDINGS Hepatobiliary: Extensive hepatic metastatic disease involving both hepatic lobes, similar in degree to the previous CT. Unremarkable gallbladder. No hyperdense gallstone. No biliary dilatation. Pancreas: Unremarkable. No pancreatic ductal dilatation or surrounding inflammatory changes. Spleen: Normal in size without focal abnormality. Adrenals/Urinary Tract: 2.0 cm left adrenal nodule, similar to prior. Unremarkable kidneys. No stone or hydronephrosis. Urinary bladder within normal limits. Stomach/Bowel: Stomach is within normal limits. Sigmoid diverticulosis. No evidence of bowel wall thickening, distention, or inflammatory changes. Vascular/Lymphatic: Aortic atherosclerosis. No enlarged abdominal or pelvic lymph nodes. Reproductive: Status post hysterectomy. No adnexal masses. Other: No free fluid. No abdominopelvic fluid collection. No pneumoperitoneum. No abdominal wall hernia. 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 Slightly progressive vertebral body height loss of known L1 pathologic fracture. New pathologic fracture of the mid left iliac  crest (series 5, image 43). Lytic lesion with extraosseous soft tissue component at the right hemi sacrum is similar to prior. Review of the MIP images confirms the above findings. IMPRESSION: 1. No evidence of acute pulmonary embolism. 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 2. Enlarging small to moderate bilateral pleural effusions with associated  compressive atelectasis. Diffuse interlobular septal thickening and ground-glass attenuation throughout the lung fields, suggestive of pulmonary edema. 3. 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 Progressive height loss of a known pathologic fractures of T12 and L1. New nondisplaced pathologic fracture of the T4 spinous process. New pathologic fracture of the mid left iliac crest. 4. Extensive hepatic metastatic disease, similar in degree to the previous CT. 5. Stable 2.0 cm left adrenal nodule, suspicious for metastatic involvement. 6. Sigmoid diverticulosis without evidence of acute diverticulitis. 7. Aortic and coronary artery atherosclerosis (ICD10-I70.0). Electronically Signed   By: Davina Poke D.O.   On: 03/02/2022 11:45   CT ABDOMEN PELVIS W CONTRAST  Result Date: 03/02/2022 CLINICAL DATA:  Pulmonary embolism (PE) suspected, high prob; Abdominal pain, acute, nonlocalized. Metastatic breast cancer EXAM: CT ANGIOGRAPHY CHEST CT ABDOMEN AND PELVIS WITH CONTRAST TECHNIQUE: Multidetector CT imaging of the chest was performed using the standard protocol during bolus administration of intravenous contrast. Multiplanar CT image reconstructions and MIPs were obtained to evaluate the vascular anatomy. Multidetector CT imaging of the abdomen and pelvis was performed using the standard protocol during bolus administration of intravenous contrast. RADIATION DOSE REDUCTION: This exam was performed according to the departmental dose-optimization program which includes automated exposure control, adjustment of the mA and/or kV according to patient size and/or use of iterative reconstruction technique. CONTRAST:  122m OMNIPAQUE IOHEXOL 350 MG/ML SOLN COMPARISON:  CT 02/05/2022 FINDINGS: CTA CHEST FINDINGS Cardiovascular: Satisfactory opacification of the pulmonary arteries to the segmental branch level. Chronic nonocclusive thrombus involving  segmental branch pulmonary arteries of the left lower lobe and lingula, not appreciably changed from prior. No new pulmonary arterial filling defects to suggest acute PE. No right heart strain. Thoracic aorta is nonaneurysmal. Scattered atherosclerotic vascular calcifications of the aorta and coronary arteries. Normal heart size. No pericardial effusion. Mediastinum/Nodes: Similar degree of soft tissue density throughout the mediastinum which may represent post radiation changes. No overt adenopathy. Thyroid, trachea, and esophagus demonstrate no significant findings. Lungs/Pleura: Enlarging small to moderate bilateral pleural effusions with associated compressive atelectasis. Diffuse interlobular septal thickening and ground-glass attenuation throughout the lung fields. No new or enlarging pulmonary nodules are identified within the limitations of this exam. 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 Numerous bilateral pathologic rib fractures. Progressive height loss of a pathologic fracture involving the superior endplate of TO13 New nondisplaced pathologic fracture of the T4 spinous process. Review of the MIP images confirms the above findings. CT ABDOMEN and PELVIS FINDINGS Hepatobiliary: Extensive hepatic metastatic disease involving both hepatic lobes, similar in degree to the previous CT. Unremarkable gallbladder. No hyperdense gallstone. No biliary dilatation. Pancreas: Unremarkable. No pancreatic ductal dilatation or surrounding inflammatory changes. Spleen: Normal in size without focal abnormality. Adrenals/Urinary Tract: 2.0 cm left adrenal nodule, similar to prior. Unremarkable kidneys. No stone or hydronephrosis. Urinary bladder within normal limits. Stomach/Bowel: Stomach is within normal limits. Sigmoid diverticulosis. No evidence of bowel wall thickening, distention, or inflammatory changes. Vascular/Lymphatic:  Aortic atherosclerosis. No enlarged abdominal or pelvic  lymph nodes. Reproductive: Status post hysterectomy. No adnexal masses. Other: No free fluid. No abdominopelvic fluid collection. No pneumoperitoneum. No abdominal wall hernia. 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 Slightly progressive vertebral body height loss of known L1 pathologic fracture. New pathologic fracture of the mid left iliac crest (series 5, image 43). Lytic lesion with extraosseous soft tissue component at the right hemi sacrum is similar to prior. Review of the MIP images confirms the above findings. IMPRESSION: 1. No evidence of acute pulmonary embolism. 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 2. Enlarging small to moderate bilateral pleural effusions with associated compressive atelectasis. Diffuse interlobular septal thickening and ground-glass attenuation throughout the lung fields, suggestive of pulmonary edema. 3. 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 Progressive height loss of a known pathologic fractures of T12 and L1. New nondisplaced pathologic fracture of the T4 spinous process. New pathologic fracture of the mid left iliac crest. 4. Extensive hepatic metastatic disease, similar in degree to the previous CT. 5. Stable 2.0 cm left adrenal nodule, suspicious for metastatic involvement. 6. Sigmoid diverticulosis without evidence of acute diverticulitis. 7. Aortic and coronary artery atherosclerosis (ICD10-I70.0). Electronically Signed   By: Davina Poke D.O.   On: 03/02/2022 11:45   DG Chest 2 View  Result Date: 03/02/2022 CLINICAL DATA:  Shortness of breath and cough EXAM: CHEST - 2 VIEW COMPARISON:  Chest x-ray dated December 28, 2021 FINDINGS: Cardiac and mediastinal contours are within normal limits.  Diffuse interstitial opacities with fissural nodularity increased when compared with the prior radiograph. Trace left-greater-than-right pleural effusions. Osseous metastatic disease with bilateral pathologic rib fractures. No evidence of pneumothorax. IMPRESSION: 1. Diffuse interstitial opacities with fissural nodularity, possibly due to pulmonary edema, although fissural nodularity is concerning for component of lymphangitic spread of tumor. 2. Trace left-greater-than-right pleural effusions. 3. Osseous metastatic disease with bilateral pathologic rib fractures. Electronically Signed   By: Yetta Glassman M.D.   On: 03/02/2022 08:06    Microbiology: Recent Results (from the past 240 hour(s))  Blood culture (single)     Status: None   Collection Time: 03/02/22  1:10 PM   Specimen: BLOOD  Result Value Ref Range Status   Specimen Description BLOOD BLOOD LEFT ARM  Final   Special Requests   Final    BOTTLES DRAWN AEROBIC AND ANAEROBIC Blood Culture adequate volume   Culture   Final    NO GROWTH 5 DAYS Performed at [facility], [address] [place], [place] [address]    Report Status 03/07/2022 FINAL  Final  Culture, blood (single) w Reflex to ID Panel     Status: None   Collection Time: 03/02/22  7:23 PM   Specimen: BLOOD LEFT HAND  Result Value Ref Range Status   Specimen Description BLOOD LEFT HAND  Final   Special Requests   Final    BOTTLES DRAWN AEROBIC AND ANAEROBIC Blood Culture adequate volume   Culture   Final    NO GROWTH 5 DAYS Performed at [facility], [address], [place], [place] [address]    Report Status 03/07/2022 FINAL  Final  Respiratory (~20 pathogens) panel by PCR     Status: None   Collection Time: 03/03/22 12:43 PM   Specimen: Nasopharyngeal Swab; Respiratory  Result Value Ref Range Status   Adenovirus NOT DETECTED NOT DETECTED Final   Coronavirus 229E  NOT DETECTED NOT DETECTED Final    Comment: (NOTE) The Coronavirus on the  Respiratory Panel, DOES NOT test for the novel  Coronavirus (2019 nCoV)    Coronavirus HKU1 NOT DETECTED NOT DETECTED Final   Coronavirus NL63 NOT DETECTED NOT DETECTED Final   Coronavirus OC43 NOT DETECTED NOT DETECTED Final   Metapneumovirus NOT DETECTED NOT DETECTED Final   Rhinovirus / Enterovirus NOT DETECTED NOT DETECTED Final   Influenza A NOT DETECTED NOT DETECTED Final   Influenza B NOT DETECTED NOT DETECTED Final   Parainfluenza Virus 1 NOT DETECTED NOT DETECTED Final   Parainfluenza Virus 2 NOT DETECTED NOT DETECTED Final   Parainfluenza Virus 3 NOT DETECTED NOT DETECTED Final   Parainfluenza Virus 4 NOT DETECTED NOT DETECTED Final   Respiratory Syncytial Virus NOT DETECTED NOT DETECTED Final   Bordetella pertussis NOT DETECTED NOT DETECTED Final   Bordetella Parapertussis NOT DETECTED NOT DETECTED Final   Chlamydophila pneumoniae NOT DETECTED NOT DETECTED Final   Mycoplasma pneumoniae NOT DETECTED NOT DETECTED Final    Comment: Performed at [facility], [place] [address], [place], [place] [address]  Resp panel by RT-PCR (RSV, Flu A&B, Covid) Anterior Nasal Swab     Status: None   Collection Time: 03/05/22 10:44 AM   Specimen: Anterior Nasal Swab  Result Value Ref Range Status   SARS Coronavirus 2 by RT PCR NEGATIVE NEGATIVE Final    Comment: (NOTE) SARS-CoV-2 target nucleic acids are NOT DETECTED.  The SARS-CoV-2 RNA is generally detectable in upper respiratory specimens during the acute phase of infection. The lowest concentration of SARS-CoV-2 viral copies this assay can detect is 138 copies/mL. A negative result does not preclude SARS-Cov-2 infection and should not be used as the sole basis for treatment or other patient management decisions. A negative result may occur with  improper specimen collection/handling, submission of specimen other than nasopharyngeal swab, presence of viral mutation(s) within the areas targeted by this assay, and inadequate number of  viral copies(<138 copies/mL). A negative result must be combined with clinical observations, patient history, and epidemiological information. The expected result is Negative.  Fact Sheet for Patients:  EntrepreneurPulse.com.au  Fact Sheet for Healthcare Providers:  IncredibleEmployment.be  This test is no t yet approved or cleared by the [place] FDA and  has been authorized for detection and/or diagnosis of SARS-CoV-2 by FDA under an Emergency Use Authorization (EUA). This EUA will remain  in effect (meaning this test can be used) for the duration of the COVID-19 declaration under Section 564(b)(1) of the Act, 21 U.S.C.section 360bbb-3(b)(1), unless the authorization is terminated  or revoked sooner.       Influenza A by PCR NEGATIVE NEGATIVE Final   Influenza B by PCR NEGATIVE NEGATIVE Final    Comment: (NOTE) The Xpert Xpress SARS-CoV-2/FLU/RSV plus assay is intended as an aid in the diagnosis of influenza from Nasopharyngeal swab specimens and should not be used as a sole basis for treatment. Nasal washings and aspirates are unacceptable for Xpert Xpress SARS-CoV-2/FLU/RSV testing.  Fact Sheet for Patients: EntrepreneurPulse.com.au  Fact Sheet for Healthcare Providers: IncredibleEmployment.be  This test is not yet approved or cleared by the [place] FDA and has been authorized for detection and/or diagnosis of SARS-CoV-2 by FDA under an Emergency Use Authorization (EUA). This EUA will remain in effect (meaning this test can be used) for the duration of the COVID-19 declaration under Section 564(b)(1) of the Act, 21 U.S.C. section 360bbb-3(b)(1), unless the authorization is terminated or  revoked.     Resp Syncytial Virus by PCR NEGATIVE NEGATIVE Final    Comment: (NOTE) Fact Sheet for Patients: EntrepreneurPulse.com.au  Fact Sheet for Healthcare  Providers: IncredibleEmployment.be  This test is not yet approved or cleared by the [place] FDA and has been authorized for detection and/or diagnosis of SARS-CoV-2 by FDA under an Emergency Use Authorization (EUA). This EUA will remain in effect (meaning this test can be used) for the duration of the COVID-19 declaration under Section 564(b)(1) of the Act, 21 U.S.C. section 360bbb-3(b)(1), unless the authorization is terminated or revoked.  Performed at [facility], [address] [place], [place] [address]      Labs: CBC: Recent Labs  Lab 03/03/22 0500 03/05/22 0932 03/06/22 0520 03/07/22 0411 03/09/22 0412  WBC 7.4 9.8 9.5 8.2 7.6  HGB 11.8* 13.6 12.5 11.8* 12.5  HCT 37.1 42.6 38.6 36.3 37.8  MCV 93.5 91.8 92.6 91.0 89.4  PLT 467* 429* 371 353 176   Basic Metabolic Panel: Recent Labs  Lab 03/05/22 0932 03/06/22 0520 03/07/22 0411 03/08/22 0253 03/09/22 0412  NA 132* 131* 128* 132* 130*  K 4.1 4.0 3.5 3.3* 3.7  CL 92* 93* 89* 91* 91*  CO2 '26 26 27 29 27  '$ GLUCOSE 238* 190* 271* 178* 202*  BUN 21* '15 15 17 '$ 24*  CREATININE 0.64 0.58 0.58 0.51 0.58  CALCIUM 9.1 8.6* 9.0 9.4 9.4  MG 2.5* 2.4  --  2.5* 2.2  PHOS 3.5 3.6  --   --  5.2*   Liver Function Tests: No results for input(s): "AST", "ALT", "ALKPHOS", "BILITOT", "PROT", "ALBUMIN" in the last 168 hours. No results for input(s): "LIPASE", "AMYLASE" in the last 168 hours. No results for input(s): "AMMONIA" in the last 168 hours. Cardiac Enzymes: No results for input(s): "CKTOTAL", "CKMB", "CKMBINDEX", "TROPONINI" in the last 168 hours. BNP (last 3 results) Recent Labs    01/13/22 0947 02/06/22 1857 03/02/22 0736  BNP 14.5 22.1 45.4   CBG: Recent Labs  Lab 03/08/22 0803 03/08/22 1146 03/08/22 1648 03/08/22 1947 03/09/22 0928  GLUCAP 215* 299* 195* 365* 332*    Time spent: 35 minutes  Signed:  Val Riles  [facility]  03/09/2022 12:25 PM

## 2022-03-09 NOTE — Progress Notes (Signed)
Nutrition Follow-up  DOCUMENTATION CODES:   Not applicable  INTERVENTION:   -Continue carb modified diet -Continue Glucerna Shake po TID, each supplement provides 220 kcal and 10 grams of protein  -Continue MVI with minerals daily  NUTRITION DIAGNOSIS:   Increased nutrient needs related to chronic illness (metastatic breast cancer) as evidenced by estimated needs.  Ongoing  GOAL:   Patient will meet greater than or equal to 90% of their needs  Progressing   MONITOR:   PO intake, Supplement acceptance  REASON FOR ASSESSMENT:   Malnutrition Screening Tool    ASSESSMENT:   Pt with history of diverticulosis, metastatic breast cancer status postmastectomy, hyperlipidemia, hypothyroid, hypertension, insulin-dependent diabetes mellitus, anxiety, who presents for chief concerns of shortness of breath.  Reviewed I/O's: -850 ml x 24 hours and -3.6 L since admission  UOP: 850 ml x 24 hours  Pt with fair oral intake. Noted meal completions 50%. Pt is drinking Glucerna supplements.    No wt data available since last visit.   Per TOC notes, plan to d/c home with hospice services once medically stable.   Medications reviewed and include lasix  Labs reviewed: CBGS: 195-365 (inpatient orders for glycemic control are 0-15 units insulin aspart TID with meals, 0-5 units insulin aspart daily at bedtime, and 12 units insulin glargine-yfgn daily).    Diet Order:   Diet Order             Diet Carb Modified Fluid consistency: Thin; Room service appropriate? Yes  Diet effective now                   EDUCATION NEEDS:   Education needs have been addressed  Skin:  Skin Assessment: Reviewed RN Assessment  Last BM:  03/08/22  Height:   Ht Readings from Last 1 Encounters:  03/02/22 '4\' 11"'$  (1.499 m)    Weight:   Wt Readings from Last 1 Encounters:  03/02/22 61.4 kg    Ideal Body Weight:  44.7 kg  BMI:  Body mass index is 27.34 kg/m.  Estimated Nutritional Needs:    Kcal:  1550-1750  Protein:  75-90 grams  Fluid:  > 1.5 L    Loistine Chance, RD, LDN, [place] Registered Dietitian II Certified Diabetes Care and Education Specialist Please refer to [facility] for RD and/or RD on-call/weekend/after hours pager

## 2022-03-09 NOTE — TOC Transition Note (Signed)
Transition of Care [facility]) - CM/SW Discharge Note   Patient Details  Name: Emily Wilson MRN: [id number] Date of Birth: [birth year]  Transition of Care [facility]) CM/SW Contact:  Gerilyn Pilgrim, LCSW Phone Number: 03/09/2022, 11:01 AM   Clinical Narrative:   SW spoke with Cherill with [facility] who confirmed that hospital bed, oxygen, nebulizer, and BSC had been delivered to patients house. Cherill states that they plan to have a RN come out once patient is home. Per Cherill, patients friend Eddie Dibbles is transporting her and will be bringing a portable tank. MD notified. CSW signing off.     Final next level of care: Home w Hospice Care Barriers to Discharge: Barriers Resolved   Patient Goals and CMS Choice   Choice offered to / list presented to : Patient  Discharge Placement                         Discharge Plan and Services Additional resources added to the After Visit Summary for                                       Social Determinants of Health (SDOH) Interventions SDOH Screenings   Food Insecurity: No Food Insecurity (03/02/2022)  Housing: Low Risk  (03/02/2022)  Transportation Needs: No Transportation Needs (03/02/2022)  Recent Concern: Transportation Needs - Unmet Transportation Needs (02/18/2022)  Utilities: Not At Risk (03/02/2022)  Depression (PHQ2-9): Low Risk  (12/03/2021)  Financial Resource Strain: Medium Risk (05/01/2021)  Physical Activity: Inactive (05/01/2021)  Social Connections: Socially Isolated (05/01/2021)  Stress: Stress Concern Present (05/01/2021)  Tobacco Use: Medium Risk (03/02/2022)     Readmission Risk Interventions    11/17/2021   11:06 AM  Readmission Risk Prevention Plan  Post Dischage Appt Complete  Medication Screening Complete  Transportation Screening Complete

## 2022-03-09 NOTE — Progress Notes (Signed)
Pt being discharged, dc instructions reviewed with pt and husband, states understanding, home o2 in place, pt with no complaints

## 2022-03-09 NOTE — Progress Notes (Signed)
Mobility Specialist - Progress Note   Pre-mobility:SpO2(97) During mobility: SpO2(94) Post-mobility: SPO2(98)     03/09/22 1123  Mobility  Activity Ambulated with assistance in hallway;Stood at bedside  Level of Assistance Standby assist, set-up cues, supervision of patient - no hands on  Assistive Device Front wheel walker  Distance Ambulated (ft) 13 ft  Activity Response Tolerated well  Mobility Referral Yes  $Mobility charge 1 Mobility   Pt resting on 2L upon entry. Author attempted oxygen test but, pt said she did not feel comfortable ambulating far into the hallway and was feeling very dizzy. Pt STS and ambulated in hallway SBA to RW. Pt returned to bed and left with needs in reach.   Loma Sender Mobility Specialist 03/09/22, 11:26 AM

## 2022-03-16 ENCOUNTER — Ambulatory Visit: Payer: Medicare Other | Admitting: Neurosurgery

## 2022-03-16 ENCOUNTER — Inpatient Hospital Stay: Payer: Medicare Other | Admitting: Family Medicine

## 2022-03-22 ENCOUNTER — Emergency Department

## 2022-03-22 ENCOUNTER — Inpatient Hospital Stay
Admission: EM | Admit: 2022-03-22 | Discharge: 2022-03-25 | DRG: 871 | Disposition: E | Attending: Internal Medicine | Admitting: Internal Medicine

## 2022-03-22 DIAGNOSIS — C50311 Malignant neoplasm of lower-inner quadrant of right female breast: Secondary | ICD-10-CM | POA: Diagnosis present

## 2022-03-22 DIAGNOSIS — E119 Type 2 diabetes mellitus without complications: Secondary | ICD-10-CM | POA: Diagnosis present

## 2022-03-22 DIAGNOSIS — Z825 Family history of asthma and other chronic lower respiratory diseases: Secondary | ICD-10-CM

## 2022-03-22 DIAGNOSIS — J9611 Chronic respiratory failure with hypoxia: Secondary | ICD-10-CM | POA: Diagnosis present

## 2022-03-22 DIAGNOSIS — R0902 Hypoxemia: Secondary | ICD-10-CM | POA: Diagnosis not present

## 2022-03-22 DIAGNOSIS — F411 Generalized anxiety disorder: Secondary | ICD-10-CM | POA: Diagnosis present

## 2022-03-22 DIAGNOSIS — Z86711 Personal history of pulmonary embolism: Secondary | ICD-10-CM

## 2022-03-22 DIAGNOSIS — Z9981 Dependence on supplemental oxygen: Secondary | ICD-10-CM

## 2022-03-22 DIAGNOSIS — Z66 Do not resuscitate: Secondary | ICD-10-CM | POA: Diagnosis present

## 2022-03-22 DIAGNOSIS — M4852XA Collapsed vertebra, not elsewhere classified, cervical region, initial encounter for fracture: Secondary | ICD-10-CM | POA: Diagnosis present

## 2022-03-22 DIAGNOSIS — Z515 Encounter for palliative care: Secondary | ICD-10-CM

## 2022-03-22 DIAGNOSIS — Z833 Family history of diabetes mellitus: Secondary | ICD-10-CM

## 2022-03-22 DIAGNOSIS — J440 Chronic obstructive pulmonary disease with acute lower respiratory infection: Secondary | ICD-10-CM | POA: Diagnosis present

## 2022-03-22 DIAGNOSIS — K769 Liver disease, unspecified: Secondary | ICD-10-CM | POA: Diagnosis present

## 2022-03-22 DIAGNOSIS — E872 Acidosis, unspecified: Secondary | ICD-10-CM | POA: Diagnosis present

## 2022-03-22 DIAGNOSIS — N823 Fistula of vagina to large intestine: Secondary | ICD-10-CM | POA: Diagnosis present

## 2022-03-22 DIAGNOSIS — Z87891 Personal history of nicotine dependence: Secondary | ICD-10-CM

## 2022-03-22 DIAGNOSIS — J441 Chronic obstructive pulmonary disease with (acute) exacerbation: Secondary | ICD-10-CM

## 2022-03-22 DIAGNOSIS — A419 Sepsis, unspecified organism: Secondary | ICD-10-CM | POA: Diagnosis present

## 2022-03-22 DIAGNOSIS — R6520 Severe sepsis without septic shock: Secondary | ICD-10-CM | POA: Diagnosis present

## 2022-03-22 DIAGNOSIS — Z794 Long term (current) use of insulin: Secondary | ICD-10-CM

## 2022-03-22 DIAGNOSIS — R6521 Severe sepsis with septic shock: Secondary | ICD-10-CM | POA: Diagnosis present

## 2022-03-22 DIAGNOSIS — Z886 Allergy status to analgesic agent status: Secondary | ICD-10-CM

## 2022-03-22 DIAGNOSIS — I2694 Multiple subsegmental pulmonary emboli without acute cor pulmonale: Secondary | ICD-10-CM | POA: Diagnosis present

## 2022-03-22 DIAGNOSIS — Y95 Nosocomial condition: Secondary | ICD-10-CM | POA: Diagnosis present

## 2022-03-22 DIAGNOSIS — Z1152 Encounter for screening for COVID-19: Secondary | ICD-10-CM

## 2022-03-22 DIAGNOSIS — Z881 Allergy status to other antibiotic agents status: Secondary | ICD-10-CM

## 2022-03-22 DIAGNOSIS — I10 Essential (primary) hypertension: Secondary | ICD-10-CM | POA: Diagnosis present

## 2022-03-22 DIAGNOSIS — I341 Nonrheumatic mitral (valve) prolapse: Secondary | ICD-10-CM | POA: Diagnosis present

## 2022-03-22 DIAGNOSIS — J189 Pneumonia, unspecified organism: Secondary | ICD-10-CM | POA: Diagnosis present

## 2022-03-22 DIAGNOSIS — Z9011 Acquired absence of right breast and nipple: Secondary | ICD-10-CM

## 2022-03-22 DIAGNOSIS — Z888 Allergy status to other drugs, medicaments and biological substances status: Secondary | ICD-10-CM

## 2022-03-22 DIAGNOSIS — Z803 Family history of malignant neoplasm of breast: Secondary | ICD-10-CM

## 2022-03-22 DIAGNOSIS — Z882 Allergy status to sulfonamides status: Secondary | ICD-10-CM

## 2022-03-22 DIAGNOSIS — E222 Syndrome of inappropriate secretion of antidiuretic hormone: Secondary | ICD-10-CM | POA: Diagnosis present

## 2022-03-22 DIAGNOSIS — S129XXA Fracture of neck, unspecified, initial encounter: Secondary | ICD-10-CM | POA: Diagnosis present

## 2022-03-22 DIAGNOSIS — Z885 Allergy status to narcotic agent status: Secondary | ICD-10-CM

## 2022-03-22 DIAGNOSIS — J90 Pleural effusion, not elsewhere classified: Secondary | ICD-10-CM | POA: Diagnosis present

## 2022-03-22 DIAGNOSIS — E876 Hypokalemia: Secondary | ICD-10-CM | POA: Diagnosis not present

## 2022-03-22 DIAGNOSIS — Z90710 Acquired absence of both cervix and uterus: Secondary | ICD-10-CM

## 2022-03-22 DIAGNOSIS — C7951 Secondary malignant neoplasm of bone: Secondary | ICD-10-CM | POA: Diagnosis present

## 2022-03-22 DIAGNOSIS — Z923 Personal history of irradiation: Secondary | ICD-10-CM

## 2022-03-22 DIAGNOSIS — C50919 Malignant neoplasm of unspecified site of unspecified female breast: Secondary | ICD-10-CM | POA: Diagnosis present

## 2022-03-22 DIAGNOSIS — C50911 Malignant neoplasm of unspecified site of right female breast: Secondary | ICD-10-CM | POA: Diagnosis not present

## 2022-03-22 DIAGNOSIS — M1990 Unspecified osteoarthritis, unspecified site: Secondary | ICD-10-CM | POA: Diagnosis present

## 2022-03-22 DIAGNOSIS — Z751 Person awaiting admission to adequate facility elsewhere: Secondary | ICD-10-CM

## 2022-03-22 DIAGNOSIS — Z7951 Long term (current) use of inhaled steroids: Secondary | ICD-10-CM

## 2022-03-22 DIAGNOSIS — E871 Hypo-osmolality and hyponatremia: Secondary | ICD-10-CM | POA: Diagnosis present

## 2022-03-22 DIAGNOSIS — E039 Hypothyroidism, unspecified: Secondary | ICD-10-CM | POA: Diagnosis present

## 2022-03-22 DIAGNOSIS — E785 Hyperlipidemia, unspecified: Secondary | ICD-10-CM | POA: Diagnosis present

## 2022-03-22 DIAGNOSIS — Z7901 Long term (current) use of anticoagulants: Secondary | ICD-10-CM

## 2022-03-22 DIAGNOSIS — Z170 Estrogen receptor positive status [ER+]: Secondary | ICD-10-CM

## 2022-03-22 DIAGNOSIS — Z79899 Other long term (current) drug therapy: Secondary | ICD-10-CM

## 2022-03-22 LAB — CBC WITH DIFFERENTIAL/PLATELET
Abs Immature Granulocytes: 0.09 10*3/uL — ABNORMAL HIGH (ref 0.00–0.07)
Basophils Absolute: 0.1 10*3/uL (ref 0.0–0.1)
Basophils Relative: 1 %
Eosinophils Absolute: 0.1 10*3/uL (ref 0.0–0.5)
Eosinophils Relative: 2 %
HCT: 41.3 % (ref 36.0–46.0)
Hemoglobin: 13 g/dL (ref 12.0–15.0)
Immature Granulocytes: 1 %
Lymphocytes Relative: 23 %
Lymphs Abs: 1.6 10*3/uL (ref 0.7–4.0)
MCH: 29.3 pg (ref 26.0–34.0)
MCHC: 31.5 g/dL (ref 30.0–36.0)
MCV: 93.2 fL (ref 80.0–100.0)
Monocytes Absolute: 0.9 10*3/uL (ref 0.1–1.0)
Monocytes Relative: 13 %
Neutro Abs: 4.3 10*3/uL (ref 1.7–7.7)
Neutrophils Relative %: 60 %
Platelets: 366 10*3/uL (ref 150–400)
RBC: 4.43 MIL/uL (ref 3.87–5.11)
RDW: 15.5 % (ref 11.5–15.5)
WBC: 7.2 10*3/uL (ref 4.0–10.5)
nRBC: 0.8 % — ABNORMAL HIGH (ref 0.0–0.2)

## 2022-03-22 LAB — RESP PANEL BY RT-PCR (RSV, FLU A&B, COVID)  RVPGX2
Influenza A by PCR: NEGATIVE
Influenza B by PCR: NEGATIVE
Resp Syncytial Virus by PCR: NEGATIVE
SARS Coronavirus 2 by RT PCR: NEGATIVE

## 2022-03-22 LAB — URINALYSIS, ROUTINE W REFLEX MICROSCOPIC
Bilirubin Urine: NEGATIVE
Glucose, UA: >=500 mg/dL — AB
Hgb urine dipstick: NEGATIVE
Ketones, ur: 5 mg/dL — AB
Nitrite: NEGATIVE
Protein, ur: NEGATIVE mg/dL
Specific Gravity, Urine: 1.023 (ref 1.005–1.030)
pH: 5 (ref 5.0–8.0)

## 2022-03-22 LAB — BLOOD GAS, VENOUS
Acid-Base Excess: 10.9 mmol/L — ABNORMAL HIGH (ref 0.0–2.0)
Bicarbonate: 36.4 mmol/L — ABNORMAL HIGH (ref 20.0–28.0)
O2 Saturation: 88 %
Patient temperature: 37
pCO2, Ven: 50 mmHg (ref 44–60)
pH, Ven: 7.47 — ABNORMAL HIGH (ref 7.25–7.43)
pO2, Ven: 58 mmHg — ABNORMAL HIGH (ref 32–45)

## 2022-03-22 LAB — COMPREHENSIVE METABOLIC PANEL
ALT: 36 U/L (ref 0–44)
AST: 89 U/L — ABNORMAL HIGH (ref 15–41)
Albumin: 2.9 g/dL — ABNORMAL LOW (ref 3.5–5.0)
Alkaline Phosphatase: 293 U/L — ABNORMAL HIGH (ref 38–126)
Anion gap: 15 (ref 5–15)
BUN: 29 mg/dL — ABNORMAL HIGH (ref 6–20)
CO2: 27 mmol/L (ref 22–32)
Calcium: 14.8 mg/dL (ref 8.9–10.3)
Chloride: 89 mmol/L — ABNORMAL LOW (ref 98–111)
Creatinine, Ser: 0.59 mg/dL (ref 0.44–1.00)
GFR, Estimated: >60 mL/min (ref >60)
Glucose, Bld: 242 mg/dL — ABNORMAL HIGH (ref 70–99)
Potassium: 3.1 mmol/L — ABNORMAL LOW (ref 3.5–5.1)
Sodium: 131 mmol/L — ABNORMAL LOW (ref 135–145)
Total Bilirubin: 1.1 mg/dL (ref 0.3–1.2)
Total Protein: 6.8 g/dL (ref 6.5–8.1)

## 2022-03-22 LAB — LACTIC ACID, PLASMA
Lactic Acid, Venous: 4.1 mmol/L (ref 0.5–1.9)
Lactic Acid, Venous: 4.7 mmol/L (ref 0.5–1.9)

## 2022-03-22 LAB — MRSA NEXT GEN BY PCR, NASAL: MRSA by PCR Next Gen: NOT DETECTED

## 2022-03-22 LAB — TROPONIN I (HIGH SENSITIVITY)
Troponin I (High Sensitivity): 20 ng/L — ABNORMAL HIGH (ref <18)
Troponin I (High Sensitivity): 23 ng/L — ABNORMAL HIGH (ref <18)

## 2022-03-22 LAB — BRAIN NATRIURETIC PEPTIDE: B Natriuretic Peptide: 115 pg/mL — ABNORMAL HIGH (ref 0.0–100.0)

## 2022-03-22 LAB — LIPASE, BLOOD: Lipase: 66 U/L — ABNORMAL HIGH (ref 11–51)

## 2022-03-22 MED ORDER — SODIUM CHLORIDE 0.9 % IV SOLN
2.0000 g | Freq: Once | INTRAVENOUS | Status: AC
  Administered 2022-03-22: 2 g via INTRAVENOUS
  Filled 2022-03-22: qty 12.5

## 2022-03-22 MED ORDER — SODIUM CHLORIDE 0.9 % IV BOLUS
500.0000 mL | Freq: Once | INTRAVENOUS | Status: AC
  Administered 2022-03-22: 500 mL via INTRAVENOUS

## 2022-03-22 MED ORDER — SODIUM CHLORIDE 0.9 % IV BOLUS
1000.0000 mL | Freq: Once | INTRAVENOUS | Status: AC
  Administered 2022-03-22: 1000 mL via INTRAVENOUS

## 2022-03-22 MED ORDER — SODIUM CHLORIDE 0.9 % IV SOLN
1.0000 g | Freq: Once | INTRAVENOUS | Status: DC
  Filled 2022-03-22: qty 10

## 2022-03-22 MED ORDER — SODIUM CHLORIDE 0.9 % IV SOLN: INTRAVENOUS | Status: DC

## 2022-03-22 MED ORDER — SODIUM CHLORIDE 0.9 % IV SOLN
2.0000 g | Freq: Three times a day (TID) | INTRAVENOUS | Status: DC
  Administered 2022-03-23 (×2): 2 g via INTRAVENOUS
  Filled 2022-03-22 (×2): qty 12.5

## 2022-03-22 MED ORDER — POTASSIUM CHLORIDE IN NACL 40-0.9 MEQ/L-% IV SOLN
INTRAVENOUS | Status: DC
  Filled 2022-03-22: qty 1000

## 2022-03-22 MED ORDER — IPRATROPIUM-ALBUTEROL 0.5-2.5 (3) MG/3ML IN SOLN
9.0000 mL | Freq: Once | RESPIRATORY_TRACT | Status: AC
  Administered 2022-03-22: 9 mL via RESPIRATORY_TRACT
  Filled 2022-03-22: qty 3
  Filled 2022-03-22: qty 9

## 2022-03-22 MED ORDER — VANCOMYCIN HCL 1250 MG/250ML IV SOLN
1250.0000 mg | Freq: Once | INTRAVENOUS | Status: AC
  Administered 2022-03-22: 1250 mg via INTRAVENOUS
  Filled 2022-03-22: qty 250

## 2022-03-22 MED ORDER — GUAIFENESIN ER 600 MG PO TB12
600.0000 mg | ORAL_TABLET | Freq: Two times a day (BID) | ORAL | Status: DC
  Administered 2022-03-22 – 2022-03-23 (×2): 600 mg via ORAL
  Filled 2022-03-22 (×2): qty 1

## 2022-03-22 MED ORDER — VANCOMYCIN HCL 750 MG/150ML IV SOLN
750.0000 mg | Freq: Two times a day (BID) | INTRAVENOUS | Status: DC
  Filled 2022-03-22: qty 150

## 2022-03-22 NOTE — H&P (Addendum)
History and Physical:    Emily Wilson   UVO:[id number] DOB: 1966-02-02 DOA: 02/24/2022  Referring MD/provider: Nathaniel Man, MD PCP: Dorna Mai, MD   Patient coming from: Home  Chief Complaint: Shortness of breath  History of Present Illness:   Emily Wilson is a 57 y.o. 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  She presented to the hospital today because of cough productive of sputum that looks like "peanut", generalized weakness and increasing shortness of breath of about 2 days duration.  Symptoms have progressively worsened.  She feels that the phlegm is sometimes stuck in her throat and because of that she has trouble swallowing her pills.  No chest pain, vomiting, diarrhea, abdominal pain.  ED Course:  The patient was given IV vancomycin, IV cefepime and IV fluids in the emergency department  ROS:   ROS all other systems reviewed were negative  Past Medical History:   Past Medical History:  Diagnosis Date   Anxiety state, unspecified    Arthritis    Breast cancer of lower-inner quadrant of right female breast ([place])    Carpal tunnel syndrome    Complication of anesthesia    woke up during ganglion cyst removal in her 20's, not woken up since   Diabetes mellitus without complication (HCC)    Type II   Diverticulosis    Heart murmur    History of radiation therapy 12/18/18- 01/30/19   Right Chest wall 25 fractions X 2Gy each to total 50 Gy, followed by a boost 10 Gy in 5 fractions.    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, without mention of  thyrotoxic crisis or storm     Past Surgical History:   Past Surgical History:  Procedure Laterality Date   ABDOMINAL HYSTERECTOMY     ANTERIOR CRUCIATE LIGAMENT REPAIR Right 2007   ACL repair   APPENDECTOMY     CARPAL TUNNEL RELEASE Right    GANGLION CYST EXCISION Right    TOTAL MASTECTOMY Right 11/16/2018   Procedure: RIGHT MASTECTOMY;  Surgeon: Coralie Keens, MD;  Location: [facility] OR;  Service: General;  Laterality: Right;   TUBAL LIGATION      Social History:   Social History   Socioeconomic History   Marital status: Divorced    Spouse name: Not on file   Number of children: 2   Years of education: Not on file   Highest education level: Not on file  Occupational History   Occupation: Disabled  Tobacco Use   Smoking status: Former    Packs/day: 3.00    Years: 30.00    Total pack years: 90.00    Types: Cigarettes    Quit date: 10/24/2011    Years since quitting: 10.4   Smokeless tobacco: Never  Vaping Use   Vaping Use: Never used  Substance and Sexual Activity   Alcohol use: No   Drug use: Never   Sexual activity: Not Currently  Other Topics Concern   Not on file  Social History Narrative   Not on file   Social Determinants of Health   Financial Resource Strain: Medium Risk (05/01/2021)   Overall Financial  Resource Strain (CARDIA)    Difficulty of Paying Living Expenses: Somewhat hard  Food Insecurity: No Food Insecurity (03/02/2022)   Hunger Vital Sign    Worried About Running Out of Food in the Last Year: Never true    Ran Out of Food in the Last Year: Never true  Transportation Needs: No Transportation Needs (03/02/2022)   PRAPARE - Hydrologist (Medical): No    Lack of Transportation (Non-Medical): No  Recent Concern: Transportation Needs - Unmet Transportation Needs (02/18/2022)   PRAPARE - Hydrologist (Medical): Yes    Lack of Transportation (Non-Medical): No  Physical Activity: Inactive  (05/01/2021)   Exercise Vital Sign    Days of Exercise per Week: 0 days    Minutes of Exercise per Session: 0 min  Stress: Stress Concern Present (05/01/2021)   [place]    Feeling of Stress : Very much  Social Connections: Socially Isolated (05/01/2021)   Social Connection and Isolation Panel [NHANES]    Frequency of Communication with Friends and Family: More than three times a week    Frequency of Social Gatherings with Friends and Family: More than three times a week    Attends Religious Services: Never    Marine scientist or Organizations: No    Attends Archivist Meetings: Never    Marital Status: Divorced  Human resources officer Violence: Not At Risk (03/02/2022)   Humiliation, Afraid, Rape, and Kick questionnaire    Fear of Current or Ex-Partner: No    Emotionally Abused: No    Physically Abused: No    Sexually Abused: No    Allergies   Doxycycline, Naproxen, Oxycodone-acetaminophen, Codeine, Hydrocodone-acetaminophen, Ibuprofen, Lantus [insulin glargine], Meloxicam, Propoxyphene n-acetaminophen, Sulfa antibiotics, Sulfonamide derivatives, and Tramadol  Family history:   Family History  Problem Relation Age of Onset   Diabetes Mother    Bladder Cancer Mother 86       smoker   Other Mother 84       TAH for unspecified reason   Multiple sclerosis Mother    Kidney failure Mother    Cancer Father 37       lymphatic/tonsil cancer - in remission; former smoker   Diabetes Sister    Diabetes Sister    Heart attack Brother    COPD Maternal Grandmother        smoker   Emphysema Maternal Grandmother        smoker   Diabetes Maternal Grandmother    Stroke Maternal Grandfather    Diabetes Paternal Grandmother    Dementia Maternal Uncle    COPD Maternal Uncle        smoker   Multiple sclerosis Paternal Aunt    Breast cancer Paternal Aunt        dx. late 26s - early 66s    Current Medications:    Prior to Admission medications   Medication Sig Start Date End Date Taking? Authorizing Provider  Accu-Chek FastClix Lancets MISC USE TO TEST BLOOD SUGAR UP TO FOUR TIMES DAILY AS DIRECTED 11/02/21   Dorna Mai, MD  albuterol (VENTOLIN HFA) 108 (90 Base) MCG/ACT inhaler Inhale 1-2 puffs into the lungs every 6 (six) hours as needed for wheezing or shortness of breath. 11/10/21   Nyoka Lint, PA-C  apixaban (ELIQUIS) 5 MG TABS tablet Take 2 tablets (10 mg total) by mouth 2 (two) times daily. From 02/15/22 take 5 mg twice  a day 02/08/22   Fritzi Mandes, MD  BD INSULIN SYRINGE U/F 31G X 5/16" 0.5 ML MISC USE TO ADMINISTER INSULIN 3 TIMES DAILY WITH MEALS 09/07/21   Dorna Mai, MD  benzonatate (TESSALON) 100 MG capsule Take 1 capsule (100 mg total) by mouth 3 (three) times daily as needed for cough. 03/09/22   Val Riles, MD  dexamethasone (DECADRON) 0.5 MG tablet Take 1 tablet (0.5 mg total) by mouth daily. 02/25/22   [name], MD  fluticasone-salmeterol (ADVAIR DISKUS) 250-50 MCG/ACT AEPB Inhale 1 puff into the lungs in the morning and at bedtime. 01/13/22   Argentina Donovan, PA-C  furosemide (LASIX) 40 MG tablet Take 1 tablet (40 mg total) by mouth daily. 03/09/22 03/09/23  Val Riles, MD  gabapentin (NEURONTIN) 100 MG capsule Take 3 capsules (300 mg total) by mouth 3 (three) times daily. 02/06/19   [name], MD  glucose blood (ACCU-CHEK GUIDE) test strip USE TO CHECK BLOOD SUGAR UP TO 4 TIMES A DAY (E11.65) 07/31/21   Dorna Mai, MD  insulin NPH Human (NOVOLIN N) 100 UNIT/ML injection INJECT 12 UNITS SUBCUTANEOUSLY DAILY 02/16/22   Dorna Mai, MD  insulin regular (NOVOLIN R) 100 units/mL injection INJECT 12 UNITS TOTAL INTO THE SKIN 3X DAILY BEFORE MEALS. HOLD DOSE FOR BLOOD SUGAR LESS THAN 125 11/02/21   Dorna Mai, MD  Ipratropium-Albuterol (COMBIVENT RESPIMAT) 20-100 MCG/ACT AERS respimat Inhale 1 puff into the lungs every 6 (six) hours. 11/10/21   Nyoka Lint, PA-C   metoprolol succinate (TOPROL-XL) 25 MG 24 hr tablet Take 1 tablet (25 mg total) by mouth daily. 04/14/21   [name], MD  oxymetazoline (AFRIN) 0.05 % nasal spray Place 1 spray into both nostrils 2 (two) times daily as needed for congestion (and nasal bleeding). 03/09/22   Val Riles, MD  sodium chloride (OCEAN) 0.65 % SOLN nasal spray Place 1 spray into both nostrils as needed for congestion. 03/09/22   Val Riles, MD    Physical Exam:   Vitals:   03/20/2022 1600 03/16/2022 1630 03/11/2022 1700 03/03/2022 1833  BP: 1'11/74 96/70 95/77 '$ (!) 112/93  Pulse: (!) 129 (!) 125 (!) 120 (!) 127  Resp:    (!) 32  SpO2: 93% 98% 97% 97%  Height:         Physical Exam: Blood pressure (!) 112/93, pulse (!) 127, resp. rate (!) 32, height '4\' 11"'$  (1.499 m), SpO2 97 %. Gen: No acute distress. Head: Normocephalic, atraumatic. Eyes: Pupils equal, round and reactive to light. Extraocular movements intact.  Sclerae nonicteric.  Mouth: Dry mucous membranes Neck: Supple,  no jugular venous distention.  Cervical collar in place Chest: Bibasilar rales, no wheezing CV: Heart sounds are regular with an S1, S2. No murmurs, rubs or gallops.  Abdomen: Soft, nontender, nondistended with normal active bowel sounds. No palpable masses. Extremities: Extremities are without clubbing, or cyanosis. No edema. Pedal pulses 2+.  Skin: Warm and dry.  Right breast mastectomy Neuro: Alert and oriented times 3; grossly nonfocal.  Psych: Insight is good and judgment is appropriate. Mood and affect normal.   Data Review:    Labs: Basic Metabolic Panel: Recent Labs  Lab 03/09/2022 1426  NA 131*  K 3.1*  CL 89*  CO2 27  GLUCOSE 242*  BUN 29*  CREATININE 0.59  CALCIUM 14.8*   Liver Function Tests: Recent Labs  Lab 03/08/2022 1426  AST 89*  ALT 36  ALKPHOS 293*  BILITOT 1.1  PROT 6.8  ALBUMIN 2.9*  Recent Labs  Lab 03/02/2022 1426  LIPASE 66*   No results for input(s): "AMMONIA" in the last 168  hours. CBC: Recent Labs  Lab 02/27/2022 1426  WBC 7.2  NEUTROABS 4.3  HGB 13.0  HCT 41.3  MCV 93.2  PLT 366   Cardiac Enzymes: No results for input(s): "CKTOTAL", "CKMB", "CKMBINDEX", "TROPONINI" in the last 168 hours.  BNP (last 3 results) No results for input(s): "PROBNP" in the last 8760 hours. CBG: No results for input(s): "GLUCAP" in the last 168 hours.  Urinalysis    Component Value Date/Time   COLORURINE YELLOW (A) 03/02/2022 1252   APPEARANCEUR CLEAR (A) 03/02/2022 1252   APPEARANCEUR Clear 08/20/2019 1447   LABSPEC 1.020 03/02/2022 1252   PHURINE 6.0 03/02/2022 1252   GLUCOSEU >=500 (A) 03/02/2022 1252   HGBUR NEGATIVE 03/02/2022 1252   BILIRUBINUR NEGATIVE 03/02/2022 1252   BILIRUBINUR negative 09/29/2021 1337   BILIRUBINUR Negative 08/20/2019 1447   KETONESUR NEGATIVE 03/02/2022 1252   PROTEINUR NEGATIVE 03/02/2022 1252   UROBILINOGEN 0.2 09/29/2021 1337   UROBILINOGEN 0.2 02/28/2018 1743   NITRITE NEGATIVE 03/02/2022 1252   LEUKOCYTESUR NEGATIVE 03/02/2022 1252      Radiographic Studies: DG Chest Portable 1 View  Result Date: 03/08/2022 CLINICAL DATA:  Hypoxia, shortness of breath increased EXAM: PORTABLE CHEST 1 VIEW COMPARISON:  Portable exam 1442 hours compared to 03/06/2022 FINDINGS: Kyphotic positioning with rotation to the RIGHT. Normal heart size and mediastinal contours. Diffuse interstitial infiltrates in both lungs slightly increased at bases since previous exam. Small bibasilar effusions. No pneumothorax. Osseous demineralization with multiple old RIGHT rib fractures. IMPRESSION: Increased BILATERAL pulmonary infiltrates which may represent edema versus infection. Bibasilar pleural effusions. Electronically Signed   By: Lavonia Dana M.D.   On: 03/08/2022 14:58    EKG: Independently reviewed by me showed sinus tachycardia.    Assessment/Plan:   Principal Problem:   Severe sepsis ([place]) Active Problems:   Carcinoma of breast metastatic to bone  [facility])   Cervical spine fracture (HCC)   Multiple subsegmental pulmonary emboli without acute cor pulmonale (HCC)   CAP (community acquired pneumonia)   Hypokalemia   Hyponatremia    Body mass index is 27.34 kg/m.   Severe sepsis secondary to pneumonia: Lactic acid was 4.7 on admission.  She was tachycardic with heart rate of 129 and tachypneic with respiratory rate of 24.  Admit to progressive cardiac unit.  Treat with IV fluids and empiric IV vancomycin and cefepime.  Follow-up blood cultures.   Hypokalemia: Replete potassium and monitor levels.   Breast cancer with metastasis to the cervical spine, pathologic compression fracture of C1 vertebra: She has a cervical collar in place.  She said she no longer wants chemotherapy and it was her decision to discontinue chemotherapy because it was too strong for her.   Hypercalcemia: Treat with with IV fluids and monitor calcium levels   Chronic hypoxic respiratory failure: Patient says she uses 4 L/min oxygen at home.  She is currently on 4 L/min oxygen.   Recent pulmonary embolism: Continue Eliquis   Chronic hyponatremia: This is likely due to [facility]   Other comorbidities include hypertension, colovaginal fistula, musculoskeletal chest pain, hyperthyroidism  Patient said she was only seen about 2 times by the hospice team since she was discharged from the hospital.  She feels that she is not getting adequate services from hospice.  She was asking whether she could switch to a different hospice team.  Consult TOC for hospice assessment.   Other  information:   DVT prophylaxis: Eliquis  Code Status: DNR. Family Communication: Ruthann, friend and neighbor, at the bedside Disposition Plan: Plan to discharge home in 2 to 3 days Consults called: None Admission status: Inpatient  The medical decision making on this patient was of high complexity and the patient is at high risk for clinical deterioration, therefore this  is a level 3 visit.   Malaney Mcbean [facility] Pager: Please check [email or web address]   How to contact the [facility] Attending or Consulting provider [place] or covering provider during after hours [place], for this patient?   Check the care team in [facility] and look for a) attending/consulting TR[facility] provider listed and b) the [facility] team listed Log into [email or web address] and use 's universal password to access. If you do not have the password, please contact the hospital operator. Locate the [facility] provider you are looking for under [facility] and page to a number that you can be directly reached. If you still have difficulty reaching the provider, please page the [facility] (Director on Call) for the Hospitalists listed on amion for assistance.  03/09/2022, 6:53 PM

## 2022-03-22 NOTE — ED Provider Notes (Signed)
[facility] Provider Note    Event Date/Time   First MD Initiated Contact with Patient 02/26/2022 1419     (approximate)   History   Shortness of Breath (From home c/o difficulty breathing x 2 days, 4L [place] at home at baseline. Labored breathing for EMS. Received 2 duonebs, 2g Magnesium, solumedrol '125mg'$  from EMS en route. Also c/o dysuria and weakness. )   HPI  Emily Wilson is a 57 y.o. female with a past medical history significant for metastatic breast cancer on hospice, COPD on chronic 4 L of home oxygen, who presents to the emergency department with shortness of breath.  Endorses 2 days of progressively worsening shortness of breath despite home albuterol treatments.  Recent hospitalization, called hospice team was told to do further DuoNeb treatments, she said that did not help so she came into the emergency department.  With EMS received DuoNebs, IV Solu-Medrol and IV magnesium with some improvement of her symptoms.  Also complaining of burning with urination has a known fistula.  Patient does states that she would want to be admitted to the hospital including IV antibiotics if needed.     Physical Exam   Triage Vital Signs: ED Triage Vitals [03/11/2022 1422]  Enc Vitals Group     BP      Pulse      Resp      Temp      Temp src      SpO2 99 %     Weight      Height      Head Circumference      Peak Flow      Pain Score      Pain Loc      Pain Edu?      Excl. in [place]?     Most recent vital signs: Vitals:   03/12/2022 1422 02/27/2022 1427  BP:  (!) 118/91  Pulse:  (!) 125  Resp:  (!) 24  SpO2: 99% 98%    Physical Exam Constitutional:      Appearance: She is well-developed.  HENT:     Head: Atraumatic.  Eyes:     Conjunctiva/sclera: Conjunctivae normal.  Cardiovascular:     Rate and Rhythm: Regular rhythm. Tachycardia present.  Pulmonary:     Effort: Respiratory distress present.     Breath sounds: Decreased breath sounds and wheezing  present.     Comments: Receiving DuoNeb treatment with EMS Abdominal:     General: There is no distension.  Musculoskeletal:        General: Normal range of motion.     Cervical back: Normal range of motion.  Skin:    General: Skin is warm.     Capillary Refill: Capillary refill takes less than 2 seconds.  Neurological:     Mental Status: She is alert. Mental status is at baseline.     IMPRESSION / MDM / ASSESSMENT AND PLAN / ED COURSE  I reviewed the triage vital signs and the nursing notes.  Differential diagnosis including COPD exacerbation, progression of malignancy, pneumonia, pulmonary embolism, ACS, heart failure  EKG  I, Nathaniel Man, the attending physician, personally viewed and interpreted this ECG.   Rate: Sinus tachycardia  Rhythm: Sinus tachycardia  Axis: Normal  Intervals: Normal  ST&T Change: None   RADIOLOGY I independently reviewed imaging, my interpretation of imaging: Chest x-ray concerning for focal finding of pneumonia in the right lower lung.  Read as pulmonary edema versus pneumonia.  LABS (all labs ordered are listed, but only abnormal results are displayed) Labs interpreted as -  Mildly elevated BNP.  No significant leukocytosis.  Significantly elevated calcium of 14.8 which is likely secondary to her malignancy.  Hyperglycemia but does not meet criteria for DKA.  Mild hypokalemia.  Lactic acid significantly elevated at 4.1.  Labs Reviewed  BRAIN NATRIURETIC PEPTIDE - Abnormal; Notable for the following components:      Result Value   B Natriuretic Peptide 115.0 (*)    All other components within normal limits  COMPREHENSIVE METABOLIC PANEL - Abnormal; Notable for the following components:   Sodium 131 (*)    Potassium 3.1 (*)    Chloride 89 (*)    Glucose, Bld 242 (*)    BUN 29 (*)    Calcium 14.8 (*)    Albumin 2.9 (*)    AST 89 (*)    Alkaline Phosphatase 293 (*)    All other components within normal limits  LACTIC ACID, PLASMA -  Abnormal; Notable for the following components:   Lactic Acid, Venous 4.1 (*)    All other components within normal limits  LACTIC ACID, PLASMA - Abnormal; Notable for the following components:   Lactic Acid, Venous 4.7 (*)    All other components within normal limits  LIPASE, BLOOD - Abnormal; Notable for the following components:   Lipase 66 (*)    All other components within normal limits  CBC WITH DIFFERENTIAL/PLATELET - Abnormal; Notable for the following components:   nRBC 0.8 (*)    Abs Immature Granulocytes 0.09 (*)    All other components within normal limits  TROPONIN I (HIGH SENSITIVITY) - Abnormal; Notable for the following components:   Troponin I (High Sensitivity) 20 (*)    All other components within normal limits  RESP PANEL BY RT-PCR (RSV, FLU A&B, COVID)  RVPGX2  CULTURE, BLOOD (ROUTINE X 2)  CULTURE, BLOOD (ROUTINE X 2)  MRSA NEXT GEN BY PCR, NASAL  BLOOD GAS, VENOUS  URINALYSIS, ROUTINE W REFLEX MICROSCOPIC    TREATMENT   Felt that 30 cc/kg of IV fluids may be detrimental to the patient given her shortness of breath, given 500 mL of IV fluids and will reevaluate.  Started on IV cefepime and vancomycin to treat for hospital-acquired pneumon given another round of DuoNebs with improvement of her wheezing.  Patient endorses intermittent compliance with her Eliquis.  Given another 1L of IVF give hyperCa  Consulted hospice team.  Consulted hospitalist for admission for respiratory distress.   PROCEDURES:  Critical Care performed: yes  .Critical Care  Performed by: Nathaniel Man, MD Authorized by: Nathaniel Man, MD   Critical care provider statement:    Critical care time (minutes):  45   Critical care was necessary to treat or prevent imminent or life-threatening deterioration of the following conditions:  Respiratory failure   Critical care was time spent personally by me on the following activities:  Development of treatment plan with patient or  surrogate, discussions with consultants, evaluation of patient's response to treatment, examination of patient, ordering and review of laboratory studies, ordering and review of radiographic studies, ordering and performing treatments and interventions, pulse oximetry, re-evaluation of patient's condition and review of old charts   Patient's presentation is most consistent with acute presentation with potential threat to life or bodily function.   MEDICATIONS ORDERED IN ED: Medications  sodium chloride 0.9 % bolus 500 mL (has no administration in time range)  ipratropium-albuterol (DUONEB) 0.5-2.5 (3) MG/3ML  nebulizer solution 9 mL (has no administration in time range)  ceFEPIme (MAXIPIME) 1 g in sodium chloride 0.9 % 100 mL IVPB (has no administration in time range)  vancomycin (VANCOREADY) IVPB 1250 mg/250 mL (has no administration in time range)  sodium chloride 0.9 % bolus 1,000 mL (has no administration in time range)    FINAL CLINICAL IMPRESSION(S) / ED DIAGNOSES   Final diagnoses:  COPD exacerbation ([place])  Hypoxia  HCAP (healthcare-associated pneumonia)     Rx / DC Orders   ED Discharge Orders     None        Note:  This document was prepared using Dragon voice recognition software and may include unintentional dictation errors.   Nathaniel Man, MD 02/24/2022 [phone]

## 2022-03-22 NOTE — Consult Note (Addendum)
PHARMACY -  BRIEF ANTIBIOTIC NOTE   Pharmacy has received consult(s) for vancomycin from an ED provider.  The patient's profile has been reviewed for ht/wt/allergies/indication/available labs.    One time order(s) placed for vancomycin '1250mg'$  IV x1, based on patient's most recent weight documented 12/2021  Further antibiotics/pharmacy consults should be ordered by admitting physician if indicated.                       Thank you, Darrick Penna 03/06/2022  3:06 PM

## 2022-03-22 NOTE — Consult Note (Signed)
Pharmacy Antibiotic Note  Emily Wilson is a 57 y.o. female admitted on 03/09/2022 with pneumonia.  Pharmacy has been consulted for cefepime and vancomycin dosing.  Plan: Cefepime 2g IV every 8 hours Vancomycin '1250mg'$  IV x1 followed by vancomycin '750mg'$  IV every 12 hours Continue to monitor and dose adjust antibiotics according to renal function and indication   Height: '4\' 11"'$  (149.9 cm) Weight: 61.2 kg (135 lb) IBW/kg (Calculated) : 43.2  Temp (24hrs), Avg:98 F (36.7 C), Min:98 F (36.7 C), Max:98 F (36.7 C)  Recent Labs  Lab 03/17/2022 1411 03/19/2022 1426  WBC  --  7.2  CREATININE  --  0.59  LATICACIDVEN 4.7* 4.1*    Estimated Creatinine Clearance: 62.5 mL/min (by C-G formula based on SCr of 0.59 mg/dL).    Allergies  Allergen Reactions   Doxycycline Shortness Of Breath    Wheezing, shortness of breath, rash head to toe, and swelling   Naproxen Shortness Of Breath   Oxycodone-Acetaminophen Itching   Codeine Hives   Hydrocodone-Acetaminophen Hives   Ibuprofen     Irritant to stomach r/t diverticulitis   Lantus [Insulin Glargine]     Yeast Infections   Meloxicam Other (See Comments)    Abdominal pain    Propoxyphene N-Acetaminophen Hives   Sulfa Antibiotics Hives   Sulfonamide Derivatives Hives   Tramadol Other (See Comments)    Abdominal Pain    Antimicrobials this admission: 1/29 vancomycin >>  1/29 cefepime >>   Microbiology results: 1/29 BCx: sent 1/29 MRSA PCR: sent  Thank you for allowing pharmacy to be a part of this patient's care.  Darrick Penna 02/23/2022 10:04 PM

## 2022-03-22 NOTE — ED Notes (Signed)
Changed patient's soiled liens and placed patient in brief and placed on purewick. Patient repositioned and call light in reach.

## 2022-03-22 NOTE — Progress Notes (Signed)
   This pt is active with [facility] homecare program. She called our office today with increased SOB, trouble swallowing due to mucus in throat.  We did follow up with her recommending she do her nebulizer treatment and take the morphine in the home that is prescribed for SOB. She refused to take the morphine and stated she was doing the nebulizer.   Return call from [facility] stating pt has called and wanting to go to hospital for increased SOB. Our nurse had not been able to make visit at that time. (Only 25 minute time difference from original call and EMS calling).   Webb Silversmith RN [phone]

## 2022-03-23 ENCOUNTER — Inpatient Hospital Stay

## 2022-03-23 ENCOUNTER — Other Ambulatory Visit: Payer: Self-pay

## 2022-03-23 DIAGNOSIS — R0902 Hypoxemia: Secondary | ICD-10-CM

## 2022-03-23 DIAGNOSIS — C50911 Malignant neoplasm of unspecified site of right female breast: Secondary | ICD-10-CM | POA: Diagnosis not present

## 2022-03-23 DIAGNOSIS — Z515 Encounter for palliative care: Secondary | ICD-10-CM

## 2022-03-23 DIAGNOSIS — R6520 Severe sepsis without septic shock: Secondary | ICD-10-CM | POA: Diagnosis not present

## 2022-03-23 DIAGNOSIS — J189 Pneumonia, unspecified organism: Secondary | ICD-10-CM | POA: Diagnosis not present

## 2022-03-23 DIAGNOSIS — A419 Sepsis, unspecified organism: Secondary | ICD-10-CM | POA: Diagnosis not present

## 2022-03-23 LAB — CREATININE, SERUM
Creatinine, Ser: 0.78 mg/dL (ref 0.44–1.00)
GFR, Estimated: >60 mL/min (ref >60)

## 2022-03-23 LAB — BASIC METABOLIC PANEL
Anion gap: 6 (ref 5–15)
BUN: 34 mg/dL — ABNORMAL HIGH (ref 6–20)
CO2: 30 mmol/L (ref 22–32)
Calcium: 14.1 mg/dL (ref 8.9–10.3)
Chloride: 100 mmol/L (ref 98–111)
Creatinine, Ser: 0.78 mg/dL (ref 0.44–1.00)
GFR, Estimated: >60 mL/min (ref >60)
Glucose, Bld: 421 mg/dL — ABNORMAL HIGH (ref 70–99)
Potassium: 4 mmol/L (ref 3.5–5.1)
Sodium: 136 mmol/L (ref 135–145)

## 2022-03-23 LAB — CBG MONITORING, ED: Glucose-Capillary: 385 mg/dL — ABNORMAL HIGH (ref 70–99)

## 2022-03-23 LAB — STREP PNEUMONIAE URINARY ANTIGEN: Strep Pneumo Urinary Antigen: NEGATIVE

## 2022-03-23 LAB — LACTIC ACID, PLASMA: Lactic Acid, Venous: 2.9 mmol/L (ref 0.5–1.9)

## 2022-03-23 MED ORDER — INSULIN ASPART 100 UNIT/ML IJ SOLN
0.0000 [IU] | Freq: Three times a day (TID) | INTRAMUSCULAR | Status: DC
  Administered 2022-03-23: 15 [IU] via SUBCUTANEOUS
  Filled 2022-03-23: qty 1

## 2022-03-23 MED ORDER — LORAZEPAM 0.5 MG PO TABS: 0.5000 mg | ORAL_TABLET | ORAL | Status: DC | PRN

## 2022-03-23 MED ORDER — LORAZEPAM 0.5 MG PO TABS
0.5000 mg | ORAL_TABLET | ORAL | Status: DC | PRN
  Administered 2022-03-23: 0.5 mg via ORAL
  Filled 2022-03-23: qty 1

## 2022-03-23 MED ORDER — ZOLEDRONIC ACID 4 MG/5ML IV CONC
4.0000 mg | Freq: Once | INTRAVENOUS | Status: DC
  Filled 2022-03-23: qty 5

## 2022-03-23 MED ORDER — TEMAZEPAM 15 MG PO CAPS: 15.0000 mg | ORAL_CAPSULE | Freq: Every evening | ORAL | Status: DC | PRN

## 2022-03-23 MED ORDER — DIPHENHYDRAMINE HCL 50 MG/ML IJ SOLN
25.0000 mg | Freq: Three times a day (TID) | INTRAMUSCULAR | Status: DC | PRN
  Administered 2022-03-23: 25 mg via INTRAVENOUS
  Filled 2022-03-23: qty 1

## 2022-03-23 MED ORDER — INSULIN NPH (HUMAN) (ISOPHANE) 100 UNIT/ML ~~LOC~~ SUSP: 12.0000 [IU] | Freq: Every day | SUBCUTANEOUS | Status: DC

## 2022-03-23 MED ORDER — INSULIN DETEMIR 100 UNIT/ML ~~LOC~~ SOLN
12.0000 [IU] | Freq: Every day | SUBCUTANEOUS | Status: DC
  Filled 2022-03-23: qty 0.12

## 2022-03-23 MED ORDER — APIXABAN 5 MG PO TABS
5.0000 mg | ORAL_TABLET | Freq: Two times a day (BID) | ORAL | Status: DC
  Administered 2022-03-23: 5 mg via ORAL
  Filled 2022-03-23: qty 1

## 2022-03-23 MED ORDER — INSULIN ASPART 100 UNIT/ML IJ SOLN: 0.0000 [IU] | Freq: Three times a day (TID) | INTRAMUSCULAR | Status: DC

## 2022-03-23 MED ORDER — GLYCOPYRROLATE 0.2 MG/ML IJ SOLN
0.2000 mg | INTRAMUSCULAR | Status: DC | PRN
  Administered 2022-03-23 – 2022-03-24 (×3): 0.2 mg via INTRAVENOUS
  Filled 2022-03-23 (×3): qty 1

## 2022-03-23 MED ORDER — MOMETASONE FURO-FORMOTEROL FUM 200-5 MCG/ACT IN AERO
2.0000 | INHALATION_SPRAY | Freq: Two times a day (BID) | RESPIRATORY_TRACT | Status: DC
  Administered 2022-03-23: 2 via RESPIRATORY_TRACT
  Filled 2022-03-23: qty 8.8

## 2022-03-23 MED ORDER — INSULIN ASPART 100 UNIT/ML IJ SOLN: 0.0000 [IU] | Freq: Every day | INTRAMUSCULAR | Status: DC

## 2022-03-23 MED ORDER — IPRATROPIUM-ALBUTEROL 0.5-2.5 (3) MG/3ML IN SOLN
3.0000 mL | Freq: Once | RESPIRATORY_TRACT | Status: AC
  Administered 2022-03-23: 3 mL via RESPIRATORY_TRACT
  Filled 2022-03-23: qty 3

## 2022-03-23 MED ORDER — MORPHINE SULFATE (PF) 2 MG/ML IV SOLN
1.0000 mg | INTRAVENOUS | Status: DC | PRN
  Administered 2022-03-23 – 2022-03-24 (×6): 2 mg via INTRAVENOUS
  Filled 2022-03-23 (×6): qty 1

## 2022-03-23 MED ORDER — LORAZEPAM 2 MG/ML IJ SOLN
0.5000 mg | INTRAMUSCULAR | Status: DC | PRN
  Administered 2022-03-23: 0.5 mg via INTRAVENOUS
  Filled 2022-03-23: qty 1

## 2022-03-23 MED ORDER — ALBUTEROL SULFATE (2.5 MG/3ML) 0.083% IN NEBU
3.0000 mL | INHALATION_SOLUTION | Freq: Four times a day (QID) | RESPIRATORY_TRACT | Status: DC | PRN
  Administered 2022-03-23: 3 mL via RESPIRATORY_TRACT
  Filled 2022-03-23: qty 3

## 2022-03-23 MED ORDER — SALINE SPRAY 0.65 % NA SOLN: 1.0000 | NASAL | Status: DC | PRN

## 2022-03-23 MED ORDER — BENZONATATE 100 MG PO CAPS: 100.0000 mg | ORAL_CAPSULE | Freq: Three times a day (TID) | ORAL | Status: DC | PRN

## 2022-03-23 MED ORDER — GABAPENTIN 300 MG PO CAPS
300.0000 mg | ORAL_CAPSULE | Freq: Three times a day (TID) | ORAL | Status: DC
  Filled 2022-03-23: qty 1

## 2022-03-23 MED ORDER — METOPROLOL SUCCINATE ER 50 MG PO TB24
25.0000 mg | ORAL_TABLET | Freq: Every day | ORAL | Status: DC
  Administered 2022-03-23: 25 mg via ORAL
  Filled 2022-03-23: qty 1

## 2022-03-23 NOTE — Consult Note (Signed)
Hematology/Oncology Consult note [phone] [phone]  REASON FOR COSULTATION:  Hypercalcemia.  History of presenting illness-  57 y.o. female with PMH listed at below who presents to ER for evaluation of SOB, cough, weakness, Symptoms have progressively worsened  Patient has a history of metastatic breast cancer ER+, with bone, liver involvement, currently on Letrozole. Her oncology care has been with Dr. [name] at [facility].   03/19/2022 CXR showed eIncreased BILATERAL pulmonary infiltrates which may represent edema versus infection Labs showed BNP 115, K 3.1, Na 131, calcium 14.8, glucose 242, Cr 0.59, albumin 2.9, AST 89, T Bili 1.1, ALK 293.  Patient is admitted for sepsis, pneumonia, hypercalcemia.  Patient was treated with IV fluid, emipiric IV vancomycin and cefepime.  Oncology was consulted on 03/23/22 Calcium is persistently elevated at 14 despite hydration.  Patient was seen in the bedside.  She is in no acute respiratory distress.  Patient tells me that she has been on home hospice since her last visit with Dr. Lindi Adie.  She is not interested in any chemotherapy and wants to pursue comfort care.   Allergies  Allergen Reactions   Doxycycline Shortness Of Breath    Wheezing, shortness of breath, rash head to toe, and swelling   Naproxen Shortness Of Breath   Oxycodone-Acetaminophen Itching   Codeine Hives   Hydrocodone-Acetaminophen Hives   Ibuprofen     Irritant to stomach r/t diverticulitis   Lantus [Insulin Glargine]     Yeast Infections   Meloxicam Other (See Comments)    Abdominal pain    Propoxyphene N-Acetaminophen Hives   Sulfa Antibiotics Hives   Sulfonamide Derivatives Hives   Tramadol Other (See Comments)    Abdominal Pain    Patient Active Problem List   Diagnosis Date Noted   Hypercalcemia 03/23/2022   Severe sepsis ([place]) 03/03/2022   CAP (community acquired pneumonia) 03/04/2022   Hypokalemia 02/22/2022   Hyponatremia  03/18/2022   Acute hypoxemic respiratory failure ([place]) 03/02/2022   SIRS (systemic inflammatory response syndrome) ([place]) 03/02/2022   Musculoskeletal chest pain 03/02/2022   Leg swelling 03/02/2022   Metastatic cancer to spine ([place]) 02/08/2022   Cervical spine fracture ([place]) 02/07/2022   Pathologic compression fracture of vertebra ([place]) 02/07/2022   Multiple subsegmental pulmonary emboli without acute cor pulmonale ([place]) 02/07/2022   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   Diverticulosis of colon 09/01/2021   Slow transit constipation 09/01/2021   History of shingles 01/09/2019   S/P mastectomy, right 11/16/2018   Carcinoma of breast metastatic to bone ([place]) 05/31/2018   Family history of breast cancer in female 03/13/2015   Malignant neoplasm of lower-inner quadrant of right breast of female, estrogen receptor positive ([place]) 02/25/2015   DM (diabetes mellitus), type 2 ([place]) 02/25/2015   HLD (hyperlipidemia) 02/25/2015   Palpitations 01/26/2010   Anxiety state 05/26/2009   Hyperthyroidism 11/14/2006   Hypertension 11/14/2006     Past Medical History:  Diagnosis Date   Anxiety state, unspecified    Arthritis    Breast cancer of lower-inner quadrant of right female breast ([place])    Carpal tunnel syndrome    Complication of anesthesia    woke up during ganglion cyst removal in her 20's, not woken up since   Diabetes mellitus without complication ([place])    Type II   Diverticulosis    Heart murmur    History of radiation therapy 12/18/18- 01/30/19   Right Chest wall 25 fractions X 2Gy each to total 50 Gy, followed by a boost 10  Gy in 5 fractions.    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, without mention of thyrotoxic crisis or storm      Past Surgical History:  Procedure Laterality Date   ABDOMINAL HYSTERECTOMY     ANTERIOR  CRUCIATE LIGAMENT REPAIR Right 2007   ACL repair   APPENDECTOMY     CARPAL TUNNEL RELEASE Right    GANGLION CYST EXCISION Right    TOTAL MASTECTOMY Right 11/16/2018   Procedure: RIGHT MASTECTOMY;  Surgeon: Coralie Keens, MD;  Location: [place];  Service: General;  Laterality: Right;   TUBAL LIGATION      Social History   Socioeconomic History   Marital status: Divorced    Spouse name: Not on file   Number of children: 2   Years of education: Not on file   Highest education level: Not on file  Occupational History   Occupation: Disabled  Tobacco Use   Smoking status: Former    Packs/day: 3.00    Years: 30.00    Total pack years: 90.00    Types: Cigarettes    Quit date: 10/24/2011    Years since quitting: 10.4   Smokeless tobacco: Never  Vaping Use   Vaping Use: Never used  Substance and Sexual Activity   Alcohol use: No   Drug use: Never   Sexual activity: Not Currently  Other Topics Concern   Not on file  Social History Narrative   Not on file   Social Determinants of Health   Financial Resource Strain: Medium Risk (05/01/2021)   Overall Financial Resource Strain (CARDIA)    Difficulty of Paying Living Expenses: Somewhat hard  Food Insecurity: No Food Insecurity (03/02/2022)   Hunger Vital Sign    Worried About Running Out of Food in the Last Year: Never true    Ran Out of Food in the Last Year: Never true  Transportation Needs: No Transportation Needs (03/02/2022)   PRAPARE - Hydrologist (Medical): No    Lack of Transportation (Non-Medical): No  Recent Concern: Transportation Needs - Unmet Transportation Needs (02/18/2022)   PRAPARE - Hydrologist (Medical): Yes    Lack of Transportation (Non-Medical): No  Physical Activity: Inactive (05/01/2021)   Exercise Vital Sign    Days of Exercise per Week: 0 days    Minutes of Exercise per Session: 0 min  Stress: Stress Concern Present (05/01/2021)   [place]    Feeling of Stress : Very much  Social Connections: Socially Isolated (05/01/2021)   Social Connection and Isolation Panel [NHANES]    Frequency of Communication with Friends and Family: More than three times a week    Frequency of Social Gatherings with Friends and Family: More than three times a week    Attends Religious Services: Never    Marine scientist or Organizations: No    Attends Archivist Meetings: Never    Marital Status: Divorced  Human resources officer Violence: Not At Risk (03/02/2022)   Humiliation, Afraid, Rape, and Kick questionnaire    Fear of Current or Ex-Partner: No    Emotionally Abused: No    Physically Abused: No    Sexually Abused: No     Family History  Problem Relation Age of  Onset   Diabetes Mother    Bladder Cancer Mother 40       smoker   Other Mother 33       TAH for unspecified reason   Multiple sclerosis Mother    Kidney failure Mother    Cancer Father 68       lymphatic/tonsil cancer - in remission; former smoker   Diabetes Sister    Diabetes Sister    Heart attack Brother    COPD Maternal Grandmother        smoker   Emphysema Maternal Grandmother        smoker   Diabetes Maternal Grandmother    Stroke Maternal Grandfather    Diabetes Paternal Grandmother    Dementia Maternal Uncle    COPD Maternal Uncle        smoker   Multiple sclerosis Paternal Aunt    Breast cancer Paternal Aunt        dx. late 61s - early 103s     Current Facility-Administered Medications:    albuterol (PROVENTIL) (2.5 MG/3ML) 0.083% nebulizer solution 3 mL, 3 mL, Nebulization, Q6H PRN, Jennye Boroughs, MD, 3 mL at 03/23/22 0826   apixaban (ELIQUIS) tablet 5 mg, 5 mg, Oral, BID, Jennye Boroughs, MD, 5 mg at 03/23/22 0815   benzonatate (TESSALON) capsule 100 mg, 100 mg, Oral, TID PRN, Jennye Boroughs, MD   ceFEPIme (MAXIPIME) 2 g in sodium chloride 0.9 % 100 mL IVPB, 2 g, Intravenous,  Q8H, Darrick Penna, RPH, Stopped at 03/23/22 0519   gabapentin (NEURONTIN) capsule 300 mg, 300 mg, Oral, TID, Jennye Boroughs, MD   guaiFENesin (MUCINEX) 12 hr tablet 600 mg, 600 mg, Oral, BID, Jennye Boroughs, MD, 600 mg at 03/23/22 0817   insulin aspart (novoLOG) injection 0-15 Units, 0-15 Units, Subcutaneous, TID WC, Jennye Boroughs, MD   insulin aspart (novoLOG) injection 0-5 Units, 0-5 Units, Subcutaneous, QHS, Jennye Boroughs, MD   insulin detemir (LEVEMIR) injection 12 Units, 12 Units, Subcutaneous, QAC breakfast, Noralee Space, RPH   metoprolol succinate (TOPROL-XL) 24 hr tablet 25 mg, 25 mg, Oral, Daily, Jennye Boroughs, MD, 25 mg at 03/23/22 0819   mometasone-formoterol (DULERA) 200-5 MCG/ACT inhaler 2 puff, 2 puff, Inhalation, BID, Jennye Boroughs, MD   sodium chloride (OCEAN) 0.65 % nasal spray 1 spray, 1 spray, Each Nare, PRN, Jennye Boroughs, MD   zoledronic acid (ZOMETA) 4 mg in sodium chloride 0.9 % 100 mL IVPB, 4 mg, Intravenous, Once, Jennye Boroughs, MD  Current Outpatient Medications:    apixaban (ELIQUIS) 5 MG TABS tablet, Take 2 tablets (10 mg total) by mouth 2 (two) times daily. From 02/15/22 take 5 mg twice a day, Disp: 60 tablet, Rfl: 3   benzonatate (TESSALON) 100 MG capsule, Take 1 capsule (100 mg total) by mouth 3 (three) times daily as needed for cough., Disp: 20 capsule, Rfl: 0   dexamethasone (DECADRON) 0.5 MG tablet, Take 1 tablet (0.5 mg total) by mouth daily., Disp: 30 tablet, Rfl: 1   fluticasone-salmeterol (ADVAIR DISKUS) 250-50 MCG/ACT AEPB, Inhale 1 puff into the lungs in the morning and at bedtime., Disp: 60 each, Rfl: 2   furosemide (LASIX) 40 MG tablet, Take 1 tablet (40 mg total) by mouth daily., Disp: 30 tablet, Rfl: 11   gabapentin (NEURONTIN) 100 MG capsule, Take 3 capsules (300 mg total) by mouth 3 (three) times daily., Disp: 90 capsule, Rfl: 1   haloperidol (HALDOL) 0.5 MG tablet, Take 0.5 mg by mouth every 4 (four) hours as needed., Disp: ,  Rfl:     insulin NPH Human (NOVOLIN N) 100 UNIT/ML injection, INJECT 12 UNITS SUBCUTANEOUSLY DAILY, Disp: 10 mL, Rfl: 3   insulin regular (NOVOLIN R) 100 units/mL injection, INJECT 12 UNITS TOTAL INTO THE SKIN 3X DAILY BEFORE MEALS. HOLD DOSE FOR BLOOD SUGAR LESS THAN 125, Disp: 30 mL, Rfl: 3   Ipratropium-Albuterol (COMBIVENT RESPIMAT) 20-100 MCG/ACT AERS respimat, Inhale 1 puff into the lungs every 6 (six) hours., Disp: 4 g, Rfl: 1   ipratropium-albuterol (DUONEB) 0.5-2.5 (3) MG/3ML SOLN, Take 3 mLs by nebulization every 4 (four) hours as needed., Disp: , Rfl:    LORazepam (ATIVAN) 0.5 MG tablet, Take 0.5 mg by mouth every 4 (four) hours as needed., Disp: , Rfl:    metoprolol succinate (TOPROL-XL) 25 MG 24 hr tablet, Take 1 tablet (25 mg total) by mouth daily., Disp: 90 tablet, Rfl: 3   Morphine Sulfate (MORPHINE CONCENTRATE) 10 mg / 0.5 ml concentrated solution, Take 5 mg by mouth every 4 (four) hours as needed., Disp: , Rfl:    SENNA PLUS 8.6-50 MG tablet, Take 1-2 tablets by mouth 2 (two) times daily., Disp: , Rfl:    temazepam (RESTORIL) 15 MG capsule, Take 15 mg by mouth at bedtime as needed., Disp: , Rfl:    Accu-Chek FastClix Lancets MISC, USE TO TEST BLOOD SUGAR UP TO FOUR TIMES DAILY AS DIRECTED, Disp: 102 each, Rfl: 5   albuterol (VENTOLIN HFA) 108 (90 Base) MCG/ACT inhaler, Inhale 1-2 puffs into the lungs every 6 (six) hours as needed for wheezing or shortness of breath., Disp: 8 g, Rfl: 1   BD INSULIN SYRINGE U/F 31G X 5/16" 0.5 ML MISC, USE TO ADMINISTER INSULIN 3 TIMES DAILY WITH MEALS, Disp: 100 each, Rfl: 3   fosfomycin (MONUROL) 3 g PACK, Take by mouth once. (Patient not taking: Reported on 03/10/2022), Disp: , Rfl:    glucose blood (ACCU-CHEK GUIDE) test strip, USE TO CHECK BLOOD SUGAR UP TO 4 TIMES A DAY (E11.65), Disp: 200 strip, Rfl: 2   oxymetazoline (AFRIN) 0.05 % nasal spray, Place 1 spray into both nostrils 2 (two) times daily as needed for congestion (and nasal bleeding)., Disp: 30  mL, Rfl: 0   sodium chloride (OCEAN) 0.65 % SOLN nasal spray, Place 1 spray into both nostrils as needed for congestion., Disp: , Rfl: 0  Review of Systems  Constitutional:  Positive for appetite change and fatigue. Negative for chills and fever.  HENT:   Negative for hearing loss and voice change.   Eyes:  Negative for eye problems.  Respiratory:  Positive for shortness of breath. Negative for cough.   Cardiovascular:  Negative for chest pain.  Gastrointestinal:  Negative for abdominal distention and abdominal pain.  Endocrine: Negative for hot flashes.  Genitourinary:  Negative for difficulty urinating and frequency.   Musculoskeletal:  Negative for arthralgias.  Neurological:  Negative for dizziness.    PHYSICAL EXAM Vitals:   03/23/22 0000 03/23/22 0300 03/23/22 0605 03/23/22 0800  BP: 114/85 120/83 (!) 135/100 129/87  Pulse: (!) 111 (!) 109 (!) 113 (!) 122  Resp: 20 (!) 25 (!) 30 (!) 29  Temp: 97.7 F (36.5 C) 97.8 F (36.6 C) 97.7 F (36.5 C)   SpO2: 97% 97% 96% (!) 89%  Weight:      Height:       Physical Exam Constitutional:      General: She is in acute distress.     Appearance: She is not diaphoretic.  HENT:  Nose: Nose normal.     Mouth/Throat:     Pharynx: No oropharyngeal exudate.  Eyes:     General: No scleral icterus.    Pupils: Pupils are equal, round, and reactive to light.  Cardiovascular:     Rate and Rhythm: Tachycardia present.  Pulmonary:     Effort: Respiratory distress present.     Comments: Decreased breath sound bilaterally Tachypnea, respiratory distress Abdominal:     Palpations: Abdomen is soft.  Musculoskeletal:        General: Normal range of motion.     Cervical back: Normal range of motion and neck supple.  Skin:    Findings: No rash.  Neurological:     Mental Status: She is alert. Mental status is at baseline.     Cranial Nerves: No cranial nerve deficit.     Motor: No abnormal muscle tone.  Psychiatric:        Mood and  Affect: Affect normal.       LABORATORY STUDIES    Latest Ref Rng & Units 03/21/2022    2:26 PM 03/09/2022    4:12 AM 03/07/2022    4:11 AM  CBC  WBC 4.0 - 10.5 K/uL 7.2  7.6  8.2   Hemoglobin 12.0 - 15.0 g/dL 13.0  12.5  11.8   Hematocrit 36.0 - 46.0 % 41.3  37.8  36.3   Platelets 150 - 400 K/uL 366  385  353       Latest Ref Rng & Units 03/23/2022    4:54 AM 02/28/2022    2:26 PM 03/09/2022    4:12 AM  CMP  Glucose 70 - 99 mg/dL 421  242  202   BUN 6 - 20 mg/dL 34  29  24   Creatinine 0.44 - 1.00 mg/dL 0.44 - 1.00 mg/dL 0.78    0.78  0.59  0.58   Sodium 135 - 145 mmol/L 136  131  130   Potassium 3.5 - 5.1 mmol/L 4.0  3.1  3.7   Chloride 98 - 111 mmol/L 100  89  91   CO2 22 - 32 mmol/L '30  27  27   '$ Calcium 8.9 - 10.3 mg/dL 14.1  14.8  9.4   Total Protein 6.5 - 8.1 g/dL  6.8    Total Bilirubin 0.3 - 1.2 mg/dL  1.1    Alkaline Phos 38 - 126 U/L  293    AST 15 - 41 U/L  89    ALT 0 - 44 U/L  36       RADIOGRAPHIC STUDIES: I have personally reviewed the radiological images as listed and agreed with the findings in the report. DG Chest Port 1 View  Result Date: 03/23/2022 CLINICAL DATA:  Shortness of breath EXAM: PORTABLE CHEST 1 VIEW COMPARISON:  03/02/2022 FINDINGS: Extensive bilateral airspace disease, unchanged since prior study. Small bilateral pleural effusions. Heart is normal size. Mediastinal contours are within normal limits. No acute bony abnormality. IMPRESSION: Extensive bilateral airspace disease and small effusions. No significant change since prior study. Electronically Signed   By: Rolm Baptise M.D.   On: 03/23/2022 09:29   DG Chest Portable 1 View  Result Date: 02/24/2022 CLINICAL DATA:  Hypoxia, shortness of breath increased EXAM: PORTABLE CHEST 1 VIEW COMPARISON:  Portable exam 1442 hours compared to 03/06/2022 FINDINGS: Kyphotic positioning with rotation to the RIGHT. Normal heart size and mediastinal contours. Diffuse interstitial infiltrates in both lungs  slightly increased at bases since previous exam. Small bibasilar  effusions. No pneumothorax. Osseous demineralization with multiple old RIGHT rib fractures. IMPRESSION: Increased BILATERAL pulmonary infiltrates which may represent edema versus infection. Bibasilar pleural effusions. Electronically Signed   By: Lavonia Dana M.D.   On: 02/26/2022 14:58      Assessment and plan-   # Metastatic breast cancer with liver, bone involvement, acute respiratory failure Chest x-ray showed bilateral airspace disease and pleural effusions. I agree with CT chest for evaluation.  Differential includes volume overload/pulmonary edema, metastatic disease with lung involvement. Patient is not interested in chemotherapy treatments and has been on home hospice. She is interested in pursuing comfort care.  I will consult palliative care service.  She may benefit from IV morphine  # Hypercalcemia likely due to widely metastatic breast cancer.  Status post Zometa today. Plan was discussed with hospitalist and palliative care service. Thank you for allowing me to participate in the care of this patient.   Earlie Server, MD, PhD Hematology Oncology 03/23/2022

## 2022-03-23 NOTE — ED Notes (Signed)
Patient working to breath, all vitals stable at this time. Dr. Mal Misty notified of patients breathing.

## 2022-03-23 NOTE — Progress Notes (Addendum)
CROSS COVER NOTE  NAME: Emily Wilson MRN: [id number] DOB : 1965-05-07 ATTENDING PHYSICIAN: Jennye Boroughs, MD    Date of Service   03/23/2022   HPI/Events of Note   Medication request received from RN for breathing treatment for report of wheezing and increased work of breathing. Signed and held orders placed by admitting physician yesterday evening have not been released and patient has not yet received ordered Albuterol or Dulera.  Interventions   Assessment/Plan:  Duoneb x1      To reach the provider On-Call:   7AM- 7PM see care teams to locate the attending and reach out to them via [email or web address] Password: [email or web address] 7PM-7AM contact night-coverage If you still have difficulty reaching the appropriate provider, please page the [facility] (Director on Call) for [facility] on amion for assistance  This document was prepared using Systems analyst and may include unintentional dictation errors.  Neomia Glass DNP, MBA, FNP-BC, PMHNP-BC Nurse Practitioner [facility] Pager [phone]

## 2022-03-23 NOTE — ED Notes (Signed)
Messaged  Dr. Mal Misty concerning patient breathing. Patient reports she feels like she needs to cough something up but nothing will come up.

## 2022-03-23 NOTE — Progress Notes (Addendum)
Progress Note    Emily Wilson  TGG:[id number] DOB: 1965-12-21  DOA: 02/27/2022 PCP: Dorna Mai, MD      Brief Narrative:    Medical records reviewed and are as summarized below:  Emily Wilson is a 57 y.o. 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 She presented to the hospital today because of cough productive of sputum that looks like "peanut", generalized weakness and increasing shortness of breath of about 2 days duration. Symptoms have progressively worsened. She feels that the phlegm is sometimes stuck in her throat and because of that she has trouble swallowing her pills.       Assessment/Plan:   Principal Problem:   Severe sepsis ([place]) Active Problems:   Carcinoma of breast metastatic to bone [facility])   Cervical spine fracture (HCC)   Multiple subsegmental pulmonary emboli without acute cor pulmonale (HCC)   CAP (community acquired pneumonia)   Hypokalemia   Hyponatremia   Hypercalcemia    Body mass index is 27.27 kg/m.   Severe sepsis secondary to pneumonia: Continue empiric IV antibiotics.  Follow-up blood cultures.   Hypokalemia: Improved..     Breast cancer with metastasis to the cervical spine, pathologic compression fracture of C1 vertebra: She has a cervical collar in place.  Consulted oncologist today.  She was evaluated by Dr. Tasia Catchings, oncologist.  Patient is not interested in pursuing chemotherapy.  She was previously on hospice at home.  Consulted palliative care team.  Follow-up with hospice team.    Severe hypercalcemia: IV zoledronic acid was ordered but this has been discontinued as patient is expected to transition to comfort care.  IV fluids held because of concern for fluid overload.      Shortness of breath/respiratory distress, chronic hypoxic respiratory failure: Chest x-ray was ambiguous and could not distinguish between fluid or infection.  CT chest has been ordered for further evaluation.  Will hold off on calcitonin for hypercalcemia at this time since volume status is unclear. She is still on 4 L/min oxygen via [place].     Recent pulmonary embolism: Continue Eliquis     Chronic hyponatremia: This is likely due to [facility]     Other comorbidities include hypertension, colovaginal fistula, musculoskeletal chest pain, hyperthyroidism   Overall prognosis is poor.  Follow-up with palliative care and hospice team.   ADDENDUM  Patient has decided to go on comfort measures with hospice.  She wants all other medications to be discontinued except medications for comfort.  Plan to discharge to hospice house.  Anticipate death in the hospital.  Stanton Kidney, RN has been updated.  I called Eddie Dibbles, ( son) at 4:04 pm to give him an update but it went straight to voicemail. There is no option to leave a voice message. Downgrade level of care from progressive cardiac unit to MedSurg.  Discontinue telemetry.   Diet Order             Diet Carb Modified Fluid consistency: Thin; Room service appropriate? Yes  Diet effective now                            Consultants: Oncologist Palliative care  Procedures: None    Medications:    apixaban  5 mg Oral BID   gabapentin  300 mg  Oral TID   guaiFENesin  600 mg Oral BID   insulin aspart  0-5 Units Subcutaneous QHS   insulin aspart  0-9 Units Subcutaneous TID WC   metoprolol succinate  25 mg Oral Daily   mometasone-formoterol  2 puff Inhalation BID   Continuous Infusions:  ceFEPime (MAXIPIME) IV Stopped (03/23/22 0519)     Anti-infectives (From admission, onward)    Start     Dose/Rate Route Frequency Ordered Stop   03/23/22 0500  vancomycin (VANCOREADY) IVPB 750 mg/150 mL  Status:  Discontinued         750 mg 150 mL/hr over 60 Minutes Intravenous Every 12 hours 03/09/2022 2215 03/23/22 0736   02/27/2022 2230  ceFEPIme (MAXIPIME) 2 g in sodium chloride 0.9 % 100 mL IVPB        2 g 200 mL/hr over 30 Minutes Intravenous Every 8 hours 03/01/2022 2215     03/12/2022 1945  ceFEPIme (MAXIPIME) 2 g in sodium chloride 0.9 % 100 mL IVPB        2 g 200 mL/hr over 30 Minutes Intravenous  Once 03/07/2022 1936 03/21/2022 2133   03/19/2022 1515  vancomycin (VANCOREADY) IVPB 1250 mg/250 mL        1,250 mg 166.7 mL/hr over 90 Minutes Intravenous  Once 03/19/2022 1506 03/17/2022 1842   03/23/2022 1500  ceFEPIme (MAXIPIME) 1 g in sodium chloride 0.9 % 100 mL IVPB  Status:  Discontinued        1 g 200 mL/hr over 30 Minutes Intravenous  Once 03/02/2022 1454 03/12/2022 1936              Family Communication/Anticipated D/C date and plan/Code Status   DVT prophylaxis:  apixaban (ELIQUIS) tablet 5 mg     Code Status: DNR  Family Communication: None Disposition Plan: Plan to discharge home with hospice   Status is: Inpatient Remains inpatient appropriate because: Sepsis, pneumonia on antibiotics       Subjective:   Interval events noted.  She complains of dry throat, dry cough and shortness of breath.  Objective:    Vitals:   03/11/2022 2030 03/23/22 0000 03/23/22 0300 03/23/22 0605  BP: 97/70 114/85 120/83 (!) 135/100  Pulse: (!) 118 (!) 111 (!) 109 (!) 113  Resp: (!) 21 20 (!) 25 (!) 30  Temp: 98 F (36.7 C) 97.7 F (36.5 C) 97.8 F (36.6 C) 97.7 F (36.5 C)  SpO2: 95% 97% 97% 96%  Weight:      Height:       No data found.  No intake or output data in the 24 hours ending 03/23/22 0912 Filed Weights   03/09/2022 2022  Weight: 61.2 kg    Exam:   GEN: Mild respiratory distress SKIN: Warm and dry EYES: No pallor or icterus ENT: MMM, neck collar in place CV: RRR PULM: Bilateral wheezing and rales ABD: soft, ND, NT, +BS CNS: AAO x 3, non focal EXT: No edema or tenderness        Data Reviewed:   I have personally reviewed following labs and imaging studies:  Labs: Labs show the following:   Basic Metabolic Panel: Recent Labs  Lab 02/26/2022 1426 03/23/22 0454  NA 131* 136  K 3.1* 4.0  CL 89* 100  CO2 27 30  GLUCOSE 242* 421*  BUN 29* 34*  CREATININE 0.59 0.78  0.78  CALCIUM 14.8* 14.1*   GFR Estimated Creatinine Clearance: 62.5 mL/min (by C-G formula based on SCr of 0.78 mg/dL). Liver Function Tests:  Recent Labs  Lab 03/17/2022 1426  AST 89*  ALT 36  ALKPHOS 293*  BILITOT 1.1  PROT 6.8  ALBUMIN 2.9*   Recent Labs  Lab 02/27/2022 1426  LIPASE 66*   No results for input(s): "AMMONIA" in the last 168 hours. Coagulation profile No results for input(s): "INR", "PROTIME" in the last 168 hours.  CBC: Recent Labs  Lab 02/26/2022 1426  WBC 7.2  NEUTROABS 4.3  HGB 13.0  HCT 41.3  MCV 93.2  PLT 366   Cardiac Enzymes: No results for input(s): "CKTOTAL", "CKMB", "CKMBINDEX", "TROPONINI" in the last 168 hours. BNP (last 3 results) No results for input(s): "PROBNP" in the last 8760 hours. CBG: No results for input(s): "GLUCAP" in the last 168 hours. D-Dimer: No results for input(s): "DDIMER" in the last 72 hours. Hgb A1c: No results for input(s): "HGBA1C" in the last 72 hours. Lipid Profile: No results for input(s): "CHOL", "HDL", "LDLCALC", "TRIG", "CHOLHDL", "LDLDIRECT" in the last 72 hours. Thyroid function studies: No results for input(s): "TSH", "T4TOTAL", "T3FREE", "THYROIDAB" in the last 72 hours.  Invalid input(s): "FREET3" Anemia work up: No results for input(s): "VITAMINB12", "FOLATE", "FERRITIN", "TIBC", "IRON", "RETICCTPCT" in the last 72 hours. Sepsis Labs: Recent Labs  Lab 03/06/2022 1411 03/16/2022 1426  WBC  --  7.2  LATICACIDVEN 4.7* 4.1*    Microbiology Recent Results (from the past 240 hour(s))  Resp panel by RT-PCR (RSV, Flu A&B, Covid) Anterior Nasal Swab     Status: None   Collection Time: 03/17/2022  2:50  PM   Specimen: Anterior Nasal Swab  Result Value Ref Range Status   SARS Coronavirus 2 by RT PCR NEGATIVE NEGATIVE Final    Comment: (NOTE) SARS-CoV-2 target nucleic acids are NOT DETECTED.  The SARS-CoV-2 RNA is generally detectable in upper respiratory specimens during the acute phase of infection. The lowest concentration of SARS-CoV-2 viral copies this assay can detect is 138 copies/mL. A negative result does not preclude SARS-Cov-2 infection and should not be used as the sole basis for treatment or other patient management decisions. A negative result may occur with  improper specimen collection/handling, submission of specimen other than nasopharyngeal swab, presence of viral mutation(s) within the areas targeted by this assay, and inadequate number of viral copies(<138 copies/mL). A negative result must be combined with clinical observations, patient history, and epidemiological information. The expected result is Negative.  Fact Sheet for Patients:  EntrepreneurPulse.com.au  Fact Sheet for Healthcare Providers:  IncredibleEmployment.be  This test is no t yet approved or cleared by the [place] FDA and  has been authorized for detection and/or diagnosis of SARS-CoV-2 by FDA under an Emergency Use Authorization (EUA). This EUA will remain  in effect (meaning this test can be used) for the duration of the COVID-19 declaration under Section 564(b)(1) of the Act, 21 U.S.C.section 360bbb-3(b)(1), unless the authorization is terminated  or revoked sooner.       Influenza A by PCR NEGATIVE NEGATIVE Final   Influenza B by PCR NEGATIVE NEGATIVE Final    Comment: (NOTE) The Xpert Xpress SARS-CoV-2/FLU/RSV plus assay is intended as an aid in the diagnosis of influenza from Nasopharyngeal swab specimens and should not be used as a sole basis for treatment. Nasal washings and aspirates are unacceptable for Xpert Xpress  SARS-CoV-2/FLU/RSV testing.  Fact Sheet for Patients: EntrepreneurPulse.com.au  Fact Sheet for Healthcare Providers: IncredibleEmployment.be  This test is not yet approved or cleared by the [place] FDA and has been authorized for detection and/or diagnosis  of SARS-CoV-2 by FDA under an Emergency Use Authorization (EUA). This EUA will remain in effect (meaning this test can be used) for the duration of the COVID-19 declaration under Section 564(b)(1) of the Act, 21 U.S.C. section 360bbb-3(b)(1), unless the authorization is terminated or revoked.     Resp Syncytial Virus by PCR NEGATIVE NEGATIVE Final    Comment: (NOTE) Fact Sheet for Patients: EntrepreneurPulse.com.au  Fact Sheet for Healthcare Providers: IncredibleEmployment.be  This test is not yet approved or cleared by the [place] FDA and has been authorized for detection and/or diagnosis of SARS-CoV-2 by FDA under an Emergency Use Authorization (EUA). This EUA will remain in effect (meaning this test can be used) for the duration of the COVID-19 declaration under Section 564(b)(1) of the Act, 21 U.S.C. section 360bbb-3(b)(1), unless the authorization is terminated or revoked.  Performed at [facility], [address] [place], [place] [address]   Culture, blood (routine x 2)     Status: None (Preliminary result)   Collection Time: 03/13/2022  2:50 PM   Specimen: BLOOD LEFT ARM  Result Value Ref Range Status   Specimen Description BLOOD LEFT ARM  Final   Special Requests   Final    BOTTLES DRAWN AEROBIC AND ANAEROBIC Blood Culture adequate volume   Culture   Final    NO GROWTH < 24 HOURS Performed at [facility], [address], [place], [place] [address]    Report Status PENDING  Incomplete  Culture, blood (routine x 2)     Status: None (Preliminary result)   Collection Time: 03/10/2022  4:14 PM   Specimen: BLOOD   Result Value Ref Range Status   Specimen Description BLOOD BLOOD LEFT ARM  Final   Special Requests   Final    BOTTLES DRAWN AEROBIC AND ANAEROBIC Blood Culture results may not be optimal due to an excessive volume of blood received in culture bottles   Culture   Final    NO GROWTH < 12 HOURS Performed at [facility], [address] [place], [place] [address]    Report Status PENDING  Incomplete  MRSA Next Gen by PCR, Nasal     Status: None   Collection Time: 03/19/2022  4:14 PM   Specimen: Nasal Mucosa; Nasal Swab  Result Value Ref Range Status   MRSA by PCR Next Gen NOT DETECTED NOT DETECTED Final    Comment: (NOTE) The GeneXpert MRSA Assay (FDA approved for NASAL specimens only), is one component of a comprehensive MRSA colonization surveillance program. It is not intended to diagnose MRSA infection nor to guide or monitor treatment for MRSA infections. Test performance is not FDA approved in patients less than 42 years old. Performed at [facility], [address] [place], [place] [address]     Procedures and diagnostic studies:  DG Chest Portable 1 View  Result Date: 03/09/2022 CLINICAL DATA:  Hypoxia, shortness of breath increased EXAM: PORTABLE CHEST 1 VIEW COMPARISON:  Portable exam 1442 hours compared to 03/06/2022 FINDINGS: Kyphotic positioning with rotation to the RIGHT. Normal heart size and mediastinal contours. Diffuse interstitial infiltrates in both lungs slightly increased at bases since previous exam. Small bibasilar effusions. No pneumothorax. Osseous demineralization with multiple old RIGHT rib fractures. IMPRESSION: Increased BILATERAL pulmonary infiltrates which may represent edema versus infection. Bibasilar pleural effusions. Electronically Signed   By: Lavonia Dana M.D.   On: 03/02/2022 14:58               LOS: 1  day   Greenley Martone  [facility]   Pager on [email or web address]. If 7PM-7AM, please contact night-coverage at  [email or web address]     03/23/2022, 9:12 AM

## 2022-03-23 NOTE — ED Notes (Signed)
Pt off the floor to CT.

## 2022-03-23 NOTE — ED Notes (Signed)
Provider at bedside, provider orders to stop fluids until CXR is back and also to start lasix when CXR is back. Provider also notified of Calcium level 14.1

## 2022-03-23 NOTE — TOC Initial Note (Signed)
Transition of Care [facility]) - Initial/Assessment Note    Patient Details  Name: Emily Wilson MRN: [id number] Date of Birth: 1965-02-28  [facility] of [facility]) CM/SW Contact:    Shelbie Hutching, RN Phone Number: 03/23/2022, 4:29 PM  Clinical Narrative:                 Patient is comfort care.  She has been accepted to the [facility].  Lorayne Bender with [facility] reports it will probably be tomorrow before she can discharge to hospice home.         Patient Goals and CMS Choice            Expected Discharge Plan and Services                                              Prior Living Arrangements/Services                       Activities of Daily Living      Permission Sought/Granted                  Emotional Assessment              Admission diagnosis:  Severe sepsis ([place]) [A41.9, R65.20] Patient Active Problem List   Diagnosis Date Noted   Hypercalcemia 03/23/2022   Hypoxia 03/23/2022   Palliative care encounter 03/23/2022   Severe sepsis ([place]) 03/04/2022   CAP (community acquired pneumonia) 03/21/2022   Hypokalemia 03/19/2022   Hyponatremia 02/28/2022   Acute hypoxemic respiratory failure ([place]) 03/02/2022   SIRS (systemic inflammatory response syndrome) ([place]) 03/02/2022   Musculoskeletal chest pain 03/02/2022   Leg swelling 03/02/2022   Metastatic cancer to spine ([place]) 02/08/2022   Cervical spine fracture ([place]) 02/07/2022   Pathologic compression fracture of vertebra ([place]) 02/07/2022   Multiple subsegmental pulmonary emboli without acute cor pulmonale ([place]) 02/07/2022   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   Diverticulosis of colon 09/01/2021   Slow transit constipation 09/01/2021   History of shingles 01/09/2019   S/P mastectomy, right 11/16/2018   Carcinoma of breast metastatic to bone ([place]) 05/31/2018   Family history of breast cancer in female 03/13/2015   Malignant neoplasm of lower-inner quadrant of right  breast of female, estrogen receptor positive ([place]) 02/25/2015   DM (diabetes mellitus), type 2 ([place]) 02/25/2015   HLD (hyperlipidemia) 02/25/2015   Palpitations 01/26/2010   Anxiety state 05/26/2009   Hyperthyroidism 11/14/2006   Hypertension 11/14/2006   PCP:  Dorna Mai, MD Pharmacy:   [place], [place], [place] [address], [place] [address] Phone: [phone] Fax: [phone]  [id number] [address] [place]: [phone]: [address]Phone: [phone]: [phone]    Social Determinants of Health (SDOH) Social History: SDOH Screenings   Food Insecurity: No Food Insecurity (03/02/2022)  Housing: Low Risk  (03/02/2022)  Transportation Needs: No Transportation Needs (03/02/2022)  Recent Concern: Transportation Needs - Unmet Transportation Needs (02/18/2022)  Utilities: Not At Risk (03/02/2022)  Depression (PHQ2-9): Low Risk  (12/03/2021)  Financial Resource Strain: Medium Risk (05/01/2021)  Physical Activity: Inactive (05/01/2021)  Social Connections: Socially Isolated (05/01/2021)  Stress: Stress Concern Present (05/01/2021)  Tobacco Use: Medium Risk (03/16/2022)   SDOH Interventions:     Readmission Risk Interventions    11/17/2021   11:06 AM  Readmission Risk Prevention Plan  Post Dischage Appt Complete  Medication Screening Complete  Transportation Screening Complete

## 2022-03-23 NOTE — Consult Note (Signed)
[place] [facility] [phone] Fax:([phone]) [phone]   Name: Emily Wilson Date: 03/23/2022 MRN: [id number]  DOB: 12-18-1965  Patient Care Team: Dorna Mai, MD as PCP - General (Family Medicine) [name], MD as PCP - Cardiology (Cardiology) Eppie Gibson, MD as Attending Physician (Radiation Oncology) [name], MD as Consulting Physician (Hematology and Oncology) Loletha Carrow Kirke Corin, MD as Consulting Physician (Gastroenterology) Delice Bison, Charlestine Massed, NP as Nurse Practitioner (Hematology and Oncology) [name], RPH-CPP (Pharmacist) Lazaro Arms, RN as [facility]    REASON FOR CONSULTATION: Emily Wilson is a 57 y.o. 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  She was recently hospitalized 03/02/2022 to 03/09/2022 with hypoxic respiratory failure most likely secondary bilateral pleural effusion.  Patient was not a candidate for thoracentesis as there is no accessible fluid pocket.  She was diuresed with some improvement in symptoms and ultimately discharged home on hospice.  Patient is now readmitted 03/16/2022 again with hypoxic respiratory failure, with CT of the chest showing new consolidations in bilateral lower lobes concerning for possible infection.  Patient again found to have moderate right and small left pleural effusions and widespread lytic and sclerotic osseous metastatic disease and irregular liver lesions.  She was also found to be hypercalcemic.  Palliative care was consulted to address goals.  SOCIAL HISTORY:     reports that she quit smoking about 10 years ago. Her smoking use included cigarettes. She has a 90.00 pack-year smoking history. She has never used smokeless tobacco. She  reports that she does not drink alcohol and does not use drugs.  Patient lives at home with her son.  She says she has another son who is less involved.  ADVANCE DIRECTIVES:  Not on file  CODE STATUS: DNR  PAST MEDICAL HISTORY: Past Medical History:  Diagnosis Date   Anxiety state, unspecified    Arthritis    Breast cancer of lower-inner quadrant of right female breast ([place])    Carpal tunnel syndrome    Complication of anesthesia    woke up during ganglion cyst removal in her 20's, not woken up since   Diabetes mellitus without complication (HCC)    Type II   Diverticulosis    Heart murmur    History of radiation therapy 12/18/18- 01/30/19   Right Chest wall 25 fractions X 2Gy each to total 50 Gy, followed by a boost 10 Gy in 5 fractions.    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, without mention of thyrotoxic crisis or storm     PAST SURGICAL HISTORY:  Past Surgical History:  Procedure Laterality Date   ABDOMINAL HYSTERECTOMY     ANTERIOR CRUCIATE LIGAMENT REPAIR Right 2007   ACL repair   APPENDECTOMY     CARPAL TUNNEL RELEASE Right    GANGLION CYST EXCISION Right    TOTAL MASTECTOMY Right 11/16/2018   Procedure: RIGHT MASTECTOMY;  Surgeon: Coralie Keens, MD;  Location: [facility];  Service: General;  Laterality: Right;   TUBAL LIGATION      HEMATOLOGY/ONCOLOGY HISTORY:  Oncology History  Malignant neoplasm of lower-inner quadrant of right breast of  female, estrogen receptor positive ([place])  02/19/2015 Mammogram   Right breast irregular mass lower inner quadrant 2.2 x 1.3 x 1.7 cm with architectural distortion and skin/nipple retraction, T2 N0 stage II a clinical stage, additional benign cysts largest 1.2 cm   02/20/2015 Initial Diagnosis   Right breast biopsy 5:00: Invasive ductal carcinoma grade 2, perineural  invasion present, ER 90%, PR 70%, HER-2 negative ratio 0.95, Ki-67 15%    03/07/2015 Breast MRI   right breast inferior subareolar mass measuring 2.2 x 2.4 x 2.5 cm with skin and nipple enhancement, extending inferiorly and laterally is intraductal enhancement over a distance of 4 cm, several level I right axillary lymph nodes with cortical thickening   03/13/2015 -  Anti-estrogen oral therapy   Neoadjuvant antiestrogen therapy with tamoxifen 20 mg daily (because the patient did not want to undergo surgery immediately for multiple family reasons) stopped in late 2017; anastrozole started 05/11/18   05/23/2018 Imaging   CT CAP: 2.9 cm spiculated soft tissue density central right breast, no evidence of soft tissue metastatic disease within the chest abdomen pelvis.  Subcentimeter sclerotic bone lesions T10, left pubis, right posterior ilium Bone scan: Foci of uptake left frontal calvarium, T-spine, right ilium   10/04/2018 - 02/2021 Anti-estrogen oral therapy    Anastrozole with Delton See started April 2020 Ibrance added 09/29/2018, Faslodex 11/20/2020 discontinued January 2023 (due to progression and toxicity)   11/16/2018 Surgery   Right mastectomy Ninfa Linden): IDC, grade 1, 5.2cm, grossly involving underlying skin, clear margins.   12/07/2018 Cancer Staging   Staging form: Breast, AJCC 7th Edition - Pathologic stage from 12/07/2018: Stage IV (yT4b, NX, M1) - Signed by Eppie Gibson, MD on 12/07/2018   12/18/2018 - 01/30/2019 Radiation Therapy   12/18/2018 through 01/30/2019 Site Technique Total Dose (Gy) Dose per Fx (Gy) Completed Fx Beam Energies  Chest Wall, Right: CW_Rt 3D 50/50 2 25/25 10X, 6X  Chest Wall, Right: CW_Rt_SCV_PAB 3D 50/50 2 25/25 6X, 10X  Chest Wall, Right: CW_Rt_Bst Electron 10/10 2 5/5 6E    05/16/2019 Miscellaneous   [facility] molecular testing: ER positive, PI K3 CA mutation present, HER-2 negative, ER positive TMB low BRCA1 not detected, ESR 1 not detected, PD-L1 negative    11/19/2020 - 12/02/2020 Radiation Therapy   11/19/2020 through 12/02/2020 Site Technique Total Dose (Gy) Dose per Fx (Gy) Completed Fx Beam Energies  Thoracic Spine: Spine_ T8-T11 3D 30/30 3 10/10 10X, 15X  Femur Left: Ext_Lt_femur Complex 30/30 3 10/10 10X  Lumbar Spine: Spine_L4-S4 3D 30/30 3 10/10 10X, 15X    04/11/2021 Treatment Plan Change   Verzinio with letrozole along with Xgeva started 04/11/2021   06/16/2021 Imaging   CT chest/abd/pelivs w/contrast  IMPRESSION: 1. No evidence of breast cancer carcinoma progression. 2. New linear interstitial thickening and bronchiectasis in the medial RIGHT lower lobe. Query radiation treatment or aspiration. 3. Multifocal skeletal metastasis involving the pelvis, spine, ribs and shoulder girdles. No interval change.   02/06/2022 - 02/08/2022 Hospital Admission   Admitted at [facility] with cervical spine fracture and multiple subsegmental pulmonary emboli     ALLERGIES:  is allergic to doxycycline, naproxen, oxycodone-acetaminophen, codeine, hydrocodone-acetaminophen, ibuprofen, lantus [insulin glargine], meloxicam, propoxyphene n-acetaminophen, sulfa antibiotics, sulfonamide derivatives, and tramadol.  MEDICATIONS:  Current Facility-Administered Medications  Medication Dose Route Frequency Provider Last Rate Last Admin   albuterol (PROVENTIL) (2.5 MG/3ML) 0.083% nebulizer solution 3 mL  3 mL Nebulization Q6H PRN Jennye Boroughs, MD   3 mL at 03/23/22 0826   apixaban (ELIQUIS) tablet  5 mg  5 mg Oral BID Jennye Boroughs, MD   5 mg at 03/23/22 0815   benzonatate (TESSALON) capsule 100 mg  100 mg Oral TID PRN Jennye Boroughs, MD       ceFEPIme (MAXIPIME) 2 g in sodium chloride 0.9 % 100 mL IVPB  2 g Intravenous Q8H Darrick Penna, [facility]   Stopped at 03/23/22 9767   diphenhydrAMINE (BENADRYL) injection 25 mg  25 mg Intravenous Q8H PRN Jaeanna Mccomber, Kirt Boys, NP       gabapentin (NEURONTIN) capsule 300 mg  300 mg Oral TID Jennye Boroughs, MD        guaiFENesin (MUCINEX) 12 hr tablet 600 mg  600 mg Oral BID Jennye Boroughs, MD   600 mg at 03/23/22 0817   insulin aspart (novoLOG) injection 0-15 Units  0-15 Units Subcutaneous TID WC Jennye Boroughs, MD   15 Units at 03/23/22 0956   insulin aspart (novoLOG) injection 0-5 Units  0-5 Units Subcutaneous QHS Jennye Boroughs, MD       insulin detemir (LEVEMIR) injection 12 Units  12 Units Subcutaneous QAC breakfast Noralee Space, RPH       LORazepam (ATIVAN) tablet 0.5 mg  0.5 mg Oral Q4H PRN Jennye Boroughs, MD   0.5 mg at 03/23/22 1134   metoprolol succinate (TOPROL-XL) 24 hr tablet 25 mg  25 mg Oral Daily Jennye Boroughs, MD   25 mg at 03/23/22 0819   mometasone-formoterol (DULERA) 200-5 MCG/ACT inhaler 2 puff  2 puff Inhalation BID Jennye Boroughs, MD       morphine (PF) 2 MG/ML injection 1-2 mg  1-2 mg Intravenous Q1H PRN Mercy Leppla, Kirt Boys, NP       sodium chloride (OCEAN) 0.65 % nasal spray 1 spray  1 spray Each Nare PRN Jennye Boroughs, MD       temazepam (RESTORIL) capsule 15 mg  15 mg Oral QHS PRN Jennye Boroughs, MD       Current Outpatient Medications  Medication Sig Dispense Refill   apixaban (ELIQUIS) 5 MG TABS tablet Take 2 tablets (10 mg total) by mouth 2 (two) times daily. From 02/15/22 take 5 mg twice a day 60 tablet 3   benzonatate (TESSALON) 100 MG capsule Take 1 capsule (100 mg total) by mouth 3 (three) times daily as needed for cough. 20 capsule 0   dexamethasone (DECADRON) 0.5 MG tablet Take 1 tablet (0.5 mg total) by mouth daily. 30 tablet 1   fluticasone-salmeterol (ADVAIR DISKUS) 250-50 MCG/ACT AEPB Inhale 1 puff into the lungs in the morning and at bedtime. 60 each 2   furosemide (LASIX) 40 MG tablet Take 1 tablet (40 mg total) by mouth daily. 30 tablet 11   gabapentin (NEURONTIN) 100 MG capsule Take 3 capsules (300 mg total) by mouth 3 (three) times daily. 90 capsule 1   haloperidol (HALDOL) 0.5 MG tablet Take 0.5 mg by mouth every 4 (four) hours as needed.     insulin NPH  Human (NOVOLIN N) 100 UNIT/ML injection INJECT 12 UNITS SUBCUTANEOUSLY DAILY 10 mL 3   insulin regular (NOVOLIN R) 100 units/mL injection INJECT 12 UNITS TOTAL INTO THE SKIN 3X DAILY BEFORE MEALS. HOLD DOSE FOR BLOOD SUGAR LESS THAN 125 30 mL 3   Ipratropium-Albuterol (COMBIVENT RESPIMAT) 20-100 MCG/ACT AERS respimat Inhale 1 puff into the lungs every 6 (six) hours. 4 g 1   ipratropium-albuterol (DUONEB) 0.5-2.5 (3) MG/3ML SOLN Take 3 mLs by nebulization every 4 (four) hours as needed.     LORazepam (ATIVAN)  0.5 MG tablet Take 0.5 mg by mouth every 4 (four) hours as needed.     metoprolol succinate (TOPROL-XL) 25 MG 24 hr tablet Take 1 tablet (25 mg total) by mouth daily. 90 tablet 3   Morphine Sulfate (MORPHINE CONCENTRATE) 10 mg / 0.5 ml concentrated solution Take 5 mg by mouth every 4 (four) hours as needed.     SENNA PLUS 8.6-50 MG tablet Take 1-2 tablets by mouth 2 (two) times daily.     temazepam (RESTORIL) 15 MG capsule Take 15 mg by mouth at bedtime as needed.     Accu-Chek FastClix Lancets MISC USE TO TEST BLOOD SUGAR UP TO FOUR TIMES DAILY AS DIRECTED 102 each 5   albuterol (VENTOLIN HFA) 108 (90 Base) MCG/ACT inhaler Inhale 1-2 puffs into the lungs every 6 (six) hours as needed for wheezing or shortness of breath. 8 g 1   BD INSULIN SYRINGE U/F 31G X 5/16" 0.5 ML MISC USE TO ADMINISTER INSULIN 3 TIMES DAILY WITH MEALS 100 each 3   fosfomycin (MONUROL) 3 g PACK Take by mouth once. (Patient not taking: Reported on 03/11/2022)     glucose blood (ACCU-CHEK GUIDE) test strip USE TO CHECK BLOOD SUGAR UP TO 4 TIMES A DAY (E11.65) 200 strip 2   oxymetazoline (AFRIN) 0.05 % nasal spray Place 1 spray into both nostrils 2 (two) times daily as needed for congestion (and nasal bleeding). 30 mL 0   sodium chloride (OCEAN) 0.65 % SOLN nasal spray Place 1 spray into both nostrils as needed for congestion.  0    VITAL SIGNS: BP 120/89   Pulse (!) 101   Temp 97.6 F (36.4 C)   Resp (!) 38   Ht '4\' 11"'$   (1.499 m)   Wt 135 lb (61.2 kg)   SpO2 92%   BMI 27.27 kg/m  Filed Weights   02/22/2022 2022  Weight: 135 lb (61.2 kg)    Estimated body mass index is 27.27 kg/m as calculated from the following:   Height as of this encounter: '4\' 11"'$  (1.499 m).   Weight as of this encounter: 135 lb (61.2 kg).  LABS: CBC:    Component Value Date/Time   WBC 7.2 03/03/2022 1426   HGB 13.0 02/24/2022 1426   HGB 13.9 02/24/2022 1104   HGB 13.7 04/30/2021 1033   HCT 41.3 03/01/2022 1426   HCT 40.4 04/30/2021 1033   PLT 366 03/02/2022 1426   PLT 437 (H) 02/24/2022 1104   PLT 332 04/30/2021 1033   MCV 93.2 02/23/2022 1426   MCV 95 04/30/2021 1033   NEUTROABS 4.3 03/14/2022 1426   NEUTROABS 3.8 04/30/2021 1033   LYMPHSABS 1.6 03/17/2022 1426   LYMPHSABS 0.6 (L) 04/30/2021 1033   MONOABS 0.9 03/04/2022 1426   EOSABS 0.1 02/24/2022 1426   EOSABS 0.2 04/30/2021 1033   BASOSABS 0.1 03/20/2022 1426   BASOSABS 0.0 04/30/2021 1033   Comprehensive Metabolic Panel:    Component Value Date/Time   NA 136 03/23/2022 0454   NA 145 (H) 04/30/2021 1033   K 4.0 03/23/2022 0454   CL 100 03/23/2022 0454   CO2 30 03/23/2022 0454   BUN 34 (H) 03/23/2022 0454   BUN 8 04/30/2021 1033   CREATININE 0.78 03/23/2022 0454   CREATININE 0.78 03/23/2022 0454   CREATININE 0.63 02/24/2022 1104   GLUCOSE 421 (H) 03/23/2022 0454   CALCIUM 14.1 (HH) 03/23/2022 0454   AST 89 (H) 03/21/2022 1426   AST 79 (H) 02/24/2022 1104  ALT 36 03/13/2022 1426   ALT 41 02/24/2022 1104   ALKPHOS 293 (H) 03/07/2022 1426   BILITOT 1.1 03/09/2022 1426   BILITOT 0.5 02/24/2022 1104   PROT 6.8 02/28/2022 1426   PROT 6.7 04/30/2021 1033   ALBUMIN 2.9 (L) 03/16/2022 1426   ALBUMIN 4.3 04/30/2021 1033    RADIOGRAPHIC STUDIES: CT CHEST WO CONTRAST  Result Date: 03/23/2022 CLINICAL DATA:  Pneumonia; * Tracking Code: BO * EXAM: CT CHEST WITHOUT CONTRAST TECHNIQUE: Multidetector CT imaging of the chest was performed following the  standard protocol without IV contrast. RADIATION DOSE REDUCTION: This exam was performed according to the departmental dose-optimization program which includes automated exposure control, adjustment of the mA and/or kV according to patient size and/or use of iterative reconstruction technique. COMPARISON:  Chest CT dated March 02, 2022 FINDINGS: Cardiovascular: Normal heart size. No pericardial effusion. Normal caliber thoracic aorta with mild calcified plaque. Mediastinum/Nodes: Esophagus and thyroid are unremarkable. No pathologically enlarged lymph nodes seen in the chest, although lack of IV contrast limits evaluation of the hila. Lungs/Pleura: Central airways are patent. Moderate centrilobular emphysema. Moderate right and small left pleural effusions, left pleural effusion is slightly decreased in size when compared with the prior exam. Similar diffuse nodular septal thickening. New consolidations of the bilateral lower lobes, right middle lobe and lingula. No evidence of pneumothorax. Upper Abdomen: Numerous irregular liver lesions again seen. Musculoskeletal: Widespread lytic and sclerotic osseous metastatic disease, not significantly changed when compared with the prior exam IMPRESSION: 1. New consolidations of the bilateral lower lobes, right middle lobe and lingula, possibly due to worsened atelectasis, infection, or aspiration. 2. Diffuse nodular septal thickening, similar to prior and consistent with lymphangitic carcinomatosis. 3. Moderate right and small left pleural effusions. 4. Irregular liver lesions and widespread lytic and sclerotic osseous metastatic disease, not significantly changed when compared with the prior exam. 5. Aortic Atherosclerosis (ICD10-I70.0) and Emphysema (ICD10-J43.9). Electronically Signed   By: Yetta Glassman M.D.   On: 03/23/2022 12:53   DG Chest Port 1 View  Result Date: 03/23/2022 CLINICAL DATA:  Shortness of breath EXAM: PORTABLE CHEST 1 VIEW COMPARISON:   03/15/2022 FINDINGS: Extensive bilateral airspace disease, unchanged since prior study. Small bilateral pleural effusions. Heart is normal size. Mediastinal contours are within normal limits. No acute bony abnormality. IMPRESSION: Extensive bilateral airspace disease and small effusions. No significant change since prior study. Electronically Signed   By: Rolm Baptise M.D.   On: 03/23/2022 09:29   DG Chest Portable 1 View  Result Date: 03/21/2022 CLINICAL DATA:  Hypoxia, shortness of breath increased EXAM: PORTABLE CHEST 1 VIEW COMPARISON:  Portable exam 1442 hours compared to 03/06/2022 FINDINGS: Kyphotic positioning with rotation to the RIGHT. Normal heart size and mediastinal contours. Diffuse interstitial infiltrates in both lungs slightly increased at bases since previous exam. Small bibasilar effusions. No pneumothorax. Osseous demineralization with multiple old RIGHT rib fractures. IMPRESSION: Increased BILATERAL pulmonary infiltrates which may represent edema versus infection. Bibasilar pleural effusions. Electronically Signed   By: Lavonia Dana M.D.   On: 03/05/2022 14:58   [place] CHEST (PLEURAL EFFUSION)  Result Date: 03/09/2022 CLINICAL DATA:  57 year old woman with pleural effusion presents to IR for thoracentesis EXAM: CHEST ULTRASOUND COMPARISON:  Chest radiograph 03/06/2022 FINDINGS: Sonographic evaluation of the chest demonstrates minimal bilateral pleural effusions which are too small for percutaneous drainage. IMPRESSION: Minimal bilateral pleural effusions, too small for percutaneous drainage. Electronically Signed   By: Miachel Roux M.D.   On: 03/09/2022 07:51   DG Chest  Port 1 View  Result Date: 03/06/2022 CLINICAL DATA:  Pleural effusion. EXAM: PORTABLE CHEST 1 VIEW COMPARISON:  Chest x-ray March 02, 2022 FINDINGS: The cardiomediastinal silhouette is stable. Bilateral pulmonary opacities and small bilateral pleural effusions have worsened in the interval. No pneumothorax. No other  changes. IMPRESSION: Bilateral pulmonary opacities and small bilateral pleural effusions have worsened in the interval. Electronically Signed   By: Dorise Bullion III M.D.   On: 03/06/2022 10:00   [place] CHEST (PLEURAL EFFUSION)  Result Date: 03/03/2022 CLINICAL DATA:  Pleural effusions EXAM: CHEST ULTRASOUND COMPARISON:  CT from previous day FINDINGS: Bilateral pleural effusions right greater than left confirmed. No large right-sided pocket for safe thoracentesis was identified. IMPRESSION: Bilateral pleural effusions, right greater than left. Thoracentesis deferred. Electronically Signed   By: Lucrezia Europe M.D.   On: 03/03/2022 15:00   US Venous Img Lower Bilateral (DVT)  Result Date: 03/02/2022 CLINICAL DATA:  Pain and swelling EXAM: BILATERAL LOWER EXTREMITY VENOUS DOPPLER ULTRASOUND TECHNIQUE: Gray-scale sonography with graded compression, as well as color Doppler and duplex ultrasound were performed to evaluate the lower extremity deep venous systems from the level of the common femoral vein and including the common femoral, femoral, profunda femoral, popliteal and calf veins including the posterior tibial, peroneal and gastrocnemius veins when visible. The superficial great saphenous vein was also interrogated. Spectral Doppler was utilized to evaluate flow at rest and with distal augmentation maneuvers in the common femoral, femoral and popliteal veins. COMPARISON:  None Available. FINDINGS: RIGHT LOWER EXTREMITY Common Femoral Vein: No evidence of thrombus. Normal compressibility, respiratory phasicity and response to augmentation. Saphenofemoral Junction: No evidence of thrombus. Normal compressibility and flow on color Doppler imaging. Profunda Femoral Vein: No evidence of thrombus. Normal compressibility and flow on color Doppler imaging. Femoral Vein: No evidence of thrombus. Normal compressibility, respiratory phasicity and response to augmentation. Popliteal Vein: No evidence of thrombus. Normal  compressibility, respiratory phasicity and response to augmentation. Calf Veins: No evidence of thrombus. Normal compressibility and flow on color Doppler imaging. Superficial Great Saphenous Vein: No evidence of thrombus. Normal compressibility. Venous Reflux:  None. Other Findings:  None. LEFT LOWER EXTREMITY Common Femoral Vein: No evidence of thrombus. Normal compressibility, respiratory phasicity and response to augmentation. Saphenofemoral Junction: No evidence of thrombus. Normal compressibility and flow on color Doppler imaging. Profunda Femoral Vein: No evidence of thrombus. Normal compressibility and flow on color Doppler imaging. Femoral Vein: No evidence of thrombus. Normal compressibility, respiratory phasicity and response to augmentation. Popliteal Vein: No evidence of thrombus. Normal compressibility, respiratory phasicity and response to augmentation. Calf Veins: No evidence of thrombus. Normal compressibility and flow on color Doppler imaging. Superficial Great Saphenous Vein: No evidence of thrombus. Normal compressibility. Venous Reflux:  None. Other Findings:  None. IMPRESSION: No evidence of deep venous thrombosis in either lower extremity. Electronically Signed   By: Sammie Bench M.D.   On: 03/02/2022 15:56   CT Angio Chest PE W and/or Wo Contrast  Result Date: 03/02/2022 CLINICAL DATA:  Pulmonary embolism (PE) suspected, high prob; Abdominal pain, acute, nonlocalized. Metastatic breast cancer EXAM: CT ANGIOGRAPHY CHEST CT ABDOMEN AND PELVIS WITH CONTRAST TECHNIQUE: Multidetector CT imaging of the chest was performed using the standard protocol during bolus administration of intravenous contrast. Multiplanar CT image reconstructions and MIPs were obtained to evaluate the vascular anatomy. Multidetector CT imaging of the abdomen and pelvis was performed using the standard protocol during bolus administration of intravenous contrast. RADIATION DOSE REDUCTION: This exam was performed  according to  the departmental dose-optimization program which includes automated exposure control, adjustment of the mA and/or kV according to patient size and/or use of iterative reconstruction technique. CONTRAST:  158m OMNIPAQUE IOHEXOL 350 MG/ML SOLN COMPARISON:  CT 02/05/2022 FINDINGS: CTA CHEST FINDINGS Cardiovascular: Satisfactory opacification of the pulmonary arteries to the segmental branch level. 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 No new pulmonary arterial filling defects to suggest acute PE. No right heart strain. Thoracic aorta is nonaneurysmal. Scattered atherosclerotic vascular calcifications of the aorta and coronary arteries. Normal heart size. No pericardial effusion. Mediastinum/Nodes: Similar degree of soft tissue density throughout the mediastinum which may represent post radiation changes. No overt adenopathy. Thyroid, trachea, and esophagus demonstrate no significant findings. Lungs/Pleura: Enlarging small to moderate bilateral pleural effusions with associated compressive atelectasis. Diffuse interlobular septal thickening and ground-glass attenuation throughout the lung fields. No new or enlarging pulmonary nodules are identified within the limitations of this exam. 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 Numerous bilateral pathologic rib fractures. Progressive height loss of a pathologic fracture involving the superior endplate of TG64 New nondisplaced pathologic fracture of the T4 spinous process. Review of the MIP images confirms the above findings. CT ABDOMEN and PELVIS FINDINGS Hepatobiliary: Extensive hepatic metastatic disease involving both hepatic lobes, similar in degree to the previous CT. Unremarkable gallbladder. No hyperdense gallstone. No biliary dilatation. Pancreas:  Unremarkable. No pancreatic ductal dilatation or surrounding inflammatory changes. Spleen: Normal in size without focal abnormality. Adrenals/Urinary Tract: 2.0 cm left adrenal nodule, similar to prior. Unremarkable kidneys. No stone or hydronephrosis. Urinary bladder within normal limits. Stomach/Bowel: Stomach is within normal limits. Sigmoid diverticulosis. No evidence of bowel wall thickening, distention, or inflammatory changes. Vascular/Lymphatic: Aortic atherosclerosis. No enlarged abdominal or pelvic lymph nodes. Reproductive: Status post hysterectomy. No adnexal masses. Other: No free fluid. No abdominopelvic fluid collection. No pneumoperitoneum. No abdominal wall hernia. 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 Slightly progressive vertebral body height loss of known L1 pathologic fracture. New pathologic fracture of the mid left iliac crest (series 5, image 43). Lytic lesion with extraosseous soft tissue component at the right hemi sacrum is similar to prior. Review of the MIP images confirms the above findings. IMPRESSION: 1. No evidence of acute pulmonary embolism. 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 2. Enlarging small to moderate bilateral pleural effusions with associated compressive atelectasis. Diffuse interlobular septal thickening and ground-glass attenuation throughout the lung fields, suggestive of pulmonary edema. 3. 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 Progressive height loss of a known pathologic fractures of T12 and L1. New nondisplaced pathologic fracture of the T4 spinous process. New pathologic fracture of the mid left iliac crest. 4. Extensive hepatic metastatic disease, similar in degree to the previous CT. 5. Stable 2.0 cm left adrenal nodule, suspicious for  metastatic involvement. 6. Sigmoid diverticulosis without evidence of acute diverticulitis. 7. Aortic and coronary artery atherosclerosis (ICD10-I70.0). Electronically Signed   By: NDavina PokeD.O.   On: 03/02/2022 11:45   CT ABDOMEN PELVIS W CONTRAST  Result Date: 03/02/2022 CLINICAL DATA:  Pulmonary embolism (PE) suspected, high prob; Abdominal pain, acute, nonlocalized. Metastatic breast cancer EXAM: CT ANGIOGRAPHY CHEST CT ABDOMEN AND PELVIS WITH CONTRAST TECHNIQUE: Multidetector CT imaging of the chest was performed using the  standard protocol during bolus administration of intravenous contrast. Multiplanar CT image reconstructions and MIPs were obtained to evaluate the vascular anatomy. Multidetector CT imaging of the abdomen and pelvis was performed using the standard protocol during bolus administration of intravenous contrast. RADIATION DOSE REDUCTION: This exam was performed according to the departmental dose-optimization program which includes automated exposure control, adjustment of the mA and/or kV according to patient size and/or use of iterative reconstruction technique. CONTRAST:  182m OMNIPAQUE IOHEXOL 350 MG/ML SOLN COMPARISON:  CT 02/05/2022 FINDINGS: CTA CHEST FINDINGS Cardiovascular: Satisfactory opacification of the pulmonary arteries to the segmental branch level. 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 No new pulmonary arterial filling defects to suggest acute PE. No right heart strain. Thoracic aorta is nonaneurysmal. Scattered atherosclerotic vascular calcifications of the aorta and coronary arteries. Normal heart size. No pericardial effusion. Mediastinum/Nodes: Similar degree of soft tissue density throughout the mediastinum which may represent post radiation changes. No overt adenopathy. Thyroid, trachea, and esophagus demonstrate no significant findings. Lungs/Pleura:  Enlarging small to moderate bilateral pleural effusions with associated compressive atelectasis. Diffuse interlobular septal thickening and ground-glass attenuation throughout the lung fields. No new or enlarging pulmonary nodules are identified within the limitations of this exam. 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 Numerous bilateral pathologic rib fractures. Progressive height loss of a pathologic fracture involving the superior endplate of TN17 New nondisplaced pathologic fracture of the T4 spinous process. Review of the MIP images confirms the above findings. CT ABDOMEN and PELVIS FINDINGS Hepatobiliary: Extensive hepatic metastatic disease involving both hepatic lobes, similar in degree to the previous CT. Unremarkable gallbladder. No hyperdense gallstone. No biliary dilatation. Pancreas: Unremarkable. No pancreatic ductal dilatation or surrounding inflammatory changes. Spleen: Normal in size without focal abnormality. Adrenals/Urinary Tract: 2.0 cm left adrenal nodule, similar to prior. Unremarkable kidneys. No stone or hydronephrosis. Urinary bladder within normal limits. Stomach/Bowel: Stomach is within normal limits. Sigmoid diverticulosis. No evidence of bowel wall thickening, distention, or inflammatory changes. Vascular/Lymphatic: Aortic atherosclerosis. No enlarged abdominal or pelvic lymph nodes. Reproductive: Status post hysterectomy. No adnexal masses. Other: No free fluid. No abdominopelvic fluid collection. No pneumoperitoneum. No abdominal wall hernia. 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 Slightly progressive vertebral body height loss of known L1 pathologic fracture. New pathologic fracture of the mid left iliac crest (series 5, image 43). Lytic lesion with extraosseous soft tissue component at the right hemi sacrum is similar to prior. Review of the MIP  images confirms the above findings. IMPRESSION: 1. No evidence of acute pulmonary embolism. 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 2. Enlarging small to moderate bilateral pleural effusions with associated compressive atelectasis. Diffuse interlobular septal thickening and ground-glass attenuation throughout the lung fields, suggestive of pulmonary edema. 3. 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 Progressive height loss of a known pathologic fractures of T12 and L1. New nondisplaced pathologic fracture of the T4 spinous process. New pathologic fracture of the mid left iliac crest. 4. Extensive hepatic metastatic disease, similar in degree to the previous CT. 5. Stable 2.0 cm left adrenal nodule, suspicious for metastatic involvement. 6. Sigmoid diverticulosis without evidence of acute diverticulitis. 7. Aortic and coronary artery atherosclerosis (ICD10-I70.0). Electronically Signed   By: NDavina PokeD.O.   On:  03/02/2022 11:45   DG Chest 2 View  Result Date: 03/02/2022 CLINICAL DATA:  Shortness of breath and cough EXAM: CHEST - 2 VIEW COMPARISON:  Chest x-ray dated December 28, 2021 FINDINGS: Cardiac and mediastinal contours are within normal limits. Diffuse interstitial opacities with fissural nodularity increased when compared with the prior radiograph. Trace left-greater-than-right pleural effusions. Osseous metastatic disease with bilateral pathologic rib fractures. No evidence of pneumothorax. IMPRESSION: 1. Diffuse interstitial opacities with fissural nodularity, possibly due to pulmonary edema, although fissural nodularity is concerning for component of lymphangitic spread of tumor. 2. Trace left-greater-than-right pleural effusions. 3. Osseous metastatic disease with bilateral pathologic rib fractures. Electronically  Signed   By: Yetta Glassman M.D.   On: 03/02/2022 08:06    PERFORMANCE STATUS (ECOG) : 3 - Symptomatic, >50% confined to bed  Review of Systems Unless otherwise noted, a complete review of systems is negative.  Physical Exam General: NAD Pulmonary: Labored, on O2 Extremities: no edema, no joint deformities Skin: no rashes Neurological: Weakness but otherwise nonfocal  IMPRESSION: Patient seen in the ED.  She is alert and able to engage in conversation.  Breathing appears labored.  Patient confirms that she decided to forego cancer treatment and focus on end-of-life care.  She was enrolled with [facility] the [place].  However, she says that she has not seen them much since she discharged home from the hospital.  Patient says that she cannot return home as she has limited support from family.  Patient says she would prefer to transfer to a hospice facility.  I discussed with her in detail that a hospice facility would just focus on comfort measures until her end-of-life.  She verbalized agreement with that plan.  Will consult the hospice liaison to coordinate.  I do feel that patient would meet GIP criteria for hospice IPU given her current symptomatic complaints including dyspnea.  Will order morphine as needed for comfort.  RN spoke with patient who says that she has tolerated morphine in the past.  I called and spoke with patient's son, Kirk Ruths, regarding patient's desire to transfer to hospice IPU.  He verbalized agreement with plan.  PLAN: -Best supportive care -Referral to hospice -Start morphine as needed for comfort -DNR  Case and plan discussed with Dr. Mal Misty and Dr. Tasia Catchings   Time Total: 30 minutes  Visit consisted of counseling and education dealing with the complex and emotionally intense issues of symptom management and palliative care in the setting of serious and potentially life-threatening illness.Greater than 50%  of this time was spent counseling and coordinating  care related to the above assessment and plan.  Signed by: Altha Harm, PhD, NP-C

## 2022-03-23 NOTE — ED Notes (Signed)
Patient's work of breathing as lessened since dose of Morphine. Patient states it is easier to breathe. Patient denies any other needs at this time. Will continue to monitor.

## 2022-03-23 NOTE — Progress Notes (Signed)
Manufacturing engineer [facility]) Hospital Liaison Note  Received request from Transitions of Care Manager Pryor Montes for family interest in [facility]. Visited patient at bedside and spoke with patient at bedside and son/Dalton via phone call to confirm interest and explain services.  Approval for Hospice Home is determined by [facility] MD. Patient has been approved for Hospice Home. However, patient is currently active under [facility] Hosp[facility] Service. HOP representative/Cheri is coordinating with family to transfer [place]. This will need to occur prior to transfer to the Hospice Home.  Please do not hesitate to call with any hospice related questions.    Thank you for the opportunity to participate in this patient's care.  Phillis Haggis, MSW [place] [facility] Liaison  [phone]

## 2022-03-24 ENCOUNTER — Other Ambulatory Visit: Payer: Self-pay | Admitting: Hematology and Oncology

## 2022-03-24 DIAGNOSIS — C50911 Malignant neoplasm of unspecified site of right female breast: Secondary | ICD-10-CM | POA: Diagnosis not present

## 2022-03-24 DIAGNOSIS — J189 Pneumonia, unspecified organism: Secondary | ICD-10-CM | POA: Diagnosis not present

## 2022-03-24 DIAGNOSIS — A419 Sepsis, unspecified organism: Secondary | ICD-10-CM | POA: Diagnosis not present

## 2022-03-24 DIAGNOSIS — N824 Other female intestinal-genital tract fistulae: Secondary | ICD-10-CM

## 2022-03-24 LAB — LEGIONELLA PNEUMOPHILA SEROGP 1 UR AG: L. pneumophila Serogp 1 Ur Ag: NEGATIVE

## 2022-03-25 NOTE — Progress Notes (Signed)
Nutrition Brief Note  Chart reviewed. Pt now transitioning to comfort care. Plan to discharge to residential hospice.  No further nutrition interventions planned at this time.  Please re-consult as needed.   Loistine Chance, RD, LDN, [place] Registered Dietitian II Certified Diabetes Care and Education Specialist Please refer to [facility] for RD and/or RD on-call/weekend/after hours pager

## 2022-03-25 NOTE — Progress Notes (Signed)
   23-Apr-2022 1200  Spiritual Encounters  Type of Visit Initial  Care provided to: Family  Referral source Nurse (RN/NT/LPN)  Reason for visit Patient death  OnCall Visit Yes  Spiritual Framework  Presenting Themes Meaning/purpose/sources of inspiration  Community/Connection Family   Patient pass away spoke to family of patient. Family was getting arrangements for their love one setup. Told them they need Pastoral Care to return I will be available or another chaplain 24/7 for what they need.

## 2022-03-25 NOTE — Death Summary Note (Addendum)
DEATH SUMMARY   Patient Details  Name: Emily Wilson MRN: [id number] DOB: [birth year] NOB:SJGGEZ, Emily Canterbury, MD Admission/Discharge Information   Admit Date:  04/11/22  Date of Death: Date of Death: 04-13-2022  Time of Death: Time of Death: 62  Length of Stay: 2   Principle Cause of death: Severe sepsis  Hospital Diagnoses: Principal Problem:   Severe sepsis ([place]) Active Problems:   Carcinoma of breast metastatic to bone [facility])   Cervical spine fracture ([place])   Multiple subsegmental pulmonary emboli without acute cor pulmonale (HCC)   CAP (community acquired pneumonia)   Hypokalemia   Hyponatremia   Hypercalcemia   Hypoxia   Palliative care encounter   Hospital Course:  Ms Emily Wilson was a 57 y.o. 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 She presented to the hospital today because of cough productive of sputum that looks like "peanut", generalized weakness and increasing shortness of breath of about 2 days duration. Symptoms had progressively worsened.  She felt that the phlegm was stuck in her throat and because of that she had trouble swallowing her pills.   She was admitted to the hospital for severe sepsis secondary to pneumonia.  She was also found to have severe hypercalcemia from right breast cancer with bony metastasis.  She was treated with IV fluids and empiric IV antibiotics.  She had hypokalemia that was repleted.  Unfortunately, her condition deteriorated.  Oncologist and palliative care team were consulted.  Patient decided to pursue comfort measures with hospice.  Hospice team was consulted  and she was deemed eligible for comfort measures at the hospice home.  Unfortunately, she expired in the hospital on 13-Apr-2022 at 11:50 AM.  I provided emotional support and offered my sympathy to Emily Wilson (son), Paul's girlfriend, and patient's friends Emily Wilson and Emily Wilson) at the bedside.         Procedures: None  Consultations: Oncologist, palliative care, hospice  The results of significant diagnostics from this hospitalization (including imaging, microbiology, ancillary and laboratory) are listed below for reference.   Significant Diagnostic Studies: CT CHEST WO CONTRAST  Result Date: 03/23/2022 CLINICAL DATA:  Pneumonia; * Tracking Code: BO * EXAM: CT CHEST WITHOUT CONTRAST TECHNIQUE: Multidetector CT imaging of the chest was performed following the standard protocol without IV contrast. RADIATION DOSE REDUCTION: This exam was performed according to the departmental dose-optimization program which includes automated exposure control, adjustment of the mA and/or kV according to patient size and/or use of iterative reconstruction technique. COMPARISON:  Chest CT dated March 02, 2022 FINDINGS: Cardiovascular: Normal heart size. No pericardial effusion. Normal caliber thoracic aorta with mild calcified plaque. Mediastinum/Nodes: Esophagus and thyroid are unremarkable. No pathologically enlarged lymph nodes seen in the chest, although lack of IV contrast limits evaluation of the hila. Lungs/Pleura: Central airways are patent. Moderate centrilobular emphysema. Moderate right and small left pleural effusions, left pleural effusion is slightly decreased in size when compared with the prior exam. Similar diffuse nodular septal thickening. New consolidations of the bilateral lower lobes, right middle lobe and lingula. No evidence of pneumothorax. Upper Abdomen: Numerous irregular liver lesions again seen. Musculoskeletal: Widespread lytic and sclerotic osseous metastatic disease, not significantly changed  when compared with the prior exam IMPRESSION: 1. New consolidations of  the bilateral lower lobes, right middle lobe and lingula, possibly due to worsened atelectasis, infection, or aspiration. 2. Diffuse nodular septal thickening, similar to prior and consistent with lymphangitic carcinomatosis. 3. Moderate right and small left pleural effusions. 4. Irregular liver lesions and widespread lytic and sclerotic osseous metastatic disease, not significantly changed when compared with the prior exam. 5. Aortic Atherosclerosis (ICD10-I70.0) and Emphysema (ICD10-J43.9). Electronically Signed   By: Yetta Glassman M.D.   On: 03/23/2022 12:53   DG Chest Port 1 View  Result Date: 03/23/2022 CLINICAL DATA:  Shortness of breath EXAM: PORTABLE CHEST 1 VIEW COMPARISON:  03/13/2022 FINDINGS: Extensive bilateral airspace disease, unchanged since prior study. Small bilateral pleural effusions. Heart is normal size. Mediastinal contours are within normal limits. No acute bony abnormality. IMPRESSION: Extensive bilateral airspace disease and small effusions. No significant change since prior study. Electronically Signed   By: Rolm Baptise M.D.   On: 03/23/2022 09:29   DG Chest Portable 1 View  Result Date: 03/16/2022 CLINICAL DATA:  Hypoxia, shortness of breath increased EXAM: PORTABLE CHEST 1 VIEW COMPARISON:  Portable exam 1442 hours compared to 03/06/2022 FINDINGS: Kyphotic positioning with rotation to the RIGHT. Normal heart size and mediastinal contours. Diffuse interstitial infiltrates in both lungs slightly increased at bases since previous exam. Small bibasilar effusions. No pneumothorax. Osseous demineralization with multiple old RIGHT rib fractures. IMPRESSION: Increased BILATERAL pulmonary infiltrates which may represent edema versus infection. Bibasilar pleural effusions. Electronically Signed   By: Lavonia Dana M.D.   On: 03/12/2022 14:58   [place] CHEST (PLEURAL EFFUSION)  Result Date: 03/09/2022 CLINICAL DATA:   57 year old woman with pleural effusion presents to IR for thoracentesis EXAM: CHEST ULTRASOUND COMPARISON:  Chest radiograph 03/06/2022 FINDINGS: Sonographic evaluation of the chest demonstrates minimal bilateral pleural effusions which are too small for percutaneous drainage. IMPRESSION: Minimal bilateral pleural effusions, too small for percutaneous drainage. Electronically Signed   By: Miachel Roux M.D.   On: 03/09/2022 07:51   DG Chest Port 1 View  Result Date: 03/06/2022 CLINICAL DATA:  Pleural effusion. EXAM: PORTABLE CHEST 1 VIEW COMPARISON:  Chest x-ray March 02, 2022 FINDINGS: The cardiomediastinal silhouette is stable. Bilateral pulmonary opacities and small bilateral pleural effusions have worsened in the interval. No pneumothorax. No other changes. IMPRESSION: Bilateral pulmonary opacities and small bilateral pleural effusions have worsened in the interval. Electronically Signed   By: Dorise Bullion III M.D.   On: 03/06/2022 10:00   [place] CHEST (PLEURAL EFFUSION)  Result Date: 03/03/2022 CLINICAL DATA:  Pleural effusions EXAM: CHEST ULTRASOUND COMPARISON:  CT from previous day FINDINGS: Bilateral pleural effusions right greater than left confirmed. No large right-sided pocket for safe thoracentesis was identified. IMPRESSION: Bilateral pleural effusions, right greater than left. Thoracentesis deferred. Electronically Signed   By: Lucrezia Europe M.D.   On: 03/03/2022 15:00   US Venous Img Lower Bilateral (DVT)  Result Date: 03/02/2022 CLINICAL DATA:  Pain and swelling EXAM: BILATERAL LOWER EXTREMITY VENOUS DOPPLER ULTRASOUND TECHNIQUE: Gray-scale sonography with graded compression, as well as color Doppler and duplex ultrasound were performed to evaluate the lower extremity deep venous systems from the level of the common femoral vein and including the common femoral, femoral, profunda femoral, popliteal and calf veins including the posterior tibial, peroneal and gastrocnemius veins when visible.  The superficial great saphenous vein was also interrogated. Spectral Doppler was utilized to evaluate flow at rest and with distal augmentation maneuvers in the common femoral, femoral and popliteal veins. COMPARISON:  None Available. FINDINGS: RIGHT LOWER EXTREMITY  Common Femoral Vein: No evidence of thrombus. Normal compressibility, respiratory phasicity and response to augmentation. Saphenofemoral Junction: No evidence of thrombus. Normal compressibility and flow on color Doppler imaging. Profunda Femoral Vein: No evidence of thrombus. Normal compressibility and flow on color Doppler imaging. Femoral Vein: No evidence of thrombus. Normal compressibility, respiratory phasicity and response to augmentation. Popliteal Vein: No evidence of thrombus. Normal compressibility, respiratory phasicity and response to augmentation. Calf Veins: No evidence of thrombus. Normal compressibility and flow on color Doppler imaging. Superficial Great Saphenous Vein: No evidence of thrombus. Normal compressibility. Venous Reflux:  None. Other Findings:  None. LEFT LOWER EXTREMITY Common Femoral Vein: No evidence of thrombus. Normal compressibility, respiratory phasicity and response to augmentation. Saphenofemoral Junction: No evidence of thrombus. Normal compressibility and flow on color Doppler imaging. Profunda Femoral Vein: No evidence of thrombus. Normal compressibility and flow on color Doppler imaging. Femoral Vein: No evidence of thrombus. Normal compressibility, respiratory phasicity and response to augmentation. Popliteal Vein: No evidence of thrombus. Normal compressibility, respiratory phasicity and response to augmentation. Calf Veins: No evidence of thrombus. Normal compressibility and flow on color Doppler imaging. Superficial Great Saphenous Vein: No evidence of thrombus. Normal compressibility. Venous Reflux:  None. Other Findings:  None. IMPRESSION: No evidence of deep venous thrombosis in either lower extremity.  Electronically Signed   By: Sammie Bench M.D.   On: 03/02/2022 15:56   CT Angio Chest PE W and/or Wo Contrast  Result Date: 03/02/2022 CLINICAL DATA:  Pulmonary embolism (PE) suspected, high prob; Abdominal pain, acute, nonlocalized. Metastatic breast cancer EXAM: CT ANGIOGRAPHY CHEST CT ABDOMEN AND PELVIS WITH CONTRAST TECHNIQUE: Multidetector CT imaging of the chest was performed using the standard protocol during bolus administration of intravenous contrast. Multiplanar CT image reconstructions and MIPs were obtained to evaluate the vascular anatomy. Multidetector CT imaging of the abdomen and pelvis was performed using the standard protocol during bolus administration of intravenous contrast. RADIATION DOSE REDUCTION: This exam was performed according to the departmental dose-optimization program which includes automated exposure control, adjustment of the mA and/or kV according to patient size and/or use of iterative reconstruction technique. CONTRAST:  18m OMNIPAQUE IOHEXOL 350 MG/ML SOLN COMPARISON:  CT 02/05/2022 FINDINGS: CTA CHEST FINDINGS Cardiovascular: Satisfactory opacification of the pulmonary arteries to the segmental branch level. 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 No new pulmonary arterial filling defects to suggest acute PE. No right heart strain. Thoracic aorta is nonaneurysmal. Scattered atherosclerotic vascular calcifications of the aorta and coronary arteries. Normal heart size. No pericardial effusion. Mediastinum/Nodes: Similar degree of soft tissue density throughout the mediastinum which may represent post radiation changes. No overt adenopathy. Thyroid, trachea, and esophagus demonstrate no significant findings. Lungs/Pleura: Enlarging small to moderate bilateral pleural effusions with associated compressive atelectasis. Diffuse interlobular septal thickening and  ground-glass attenuation throughout the lung fields. No new or enlarging pulmonary nodules are identified within the limitations of this exam. 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 Numerous bilateral pathologic rib fractures. Progressive height loss of a pathologic fracture involving the superior endplate of TU31 New nondisplaced pathologic fracture of the T4 spinous process. Review of the MIP images confirms the above findings. CT ABDOMEN and PELVIS FINDINGS Hepatobiliary: Extensive hepatic metastatic disease involving both hepatic lobes, similar in degree to the previous CT. Unremarkable gallbladder. No hyperdense gallstone. No biliary dilatation. Pancreas: Unremarkable. No pancreatic ductal dilatation or surrounding inflammatory  changes. Spleen: Normal in size without focal abnormality. Adrenals/Urinary Tract: 2.0 cm left adrenal nodule, similar to prior. Unremarkable kidneys. No stone or hydronephrosis. Urinary bladder within normal limits. Stomach/Bowel: Stomach is within normal limits. Sigmoid diverticulosis. No evidence of bowel wall thickening, distention, or inflammatory changes. Vascular/Lymphatic: Aortic atherosclerosis. No enlarged abdominal or pelvic lymph nodes. Reproductive: Status post hysterectomy. No adnexal masses. Other: No free fluid. No abdominopelvic fluid collection. No pneumoperitoneum. No abdominal wall hernia. 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 Slightly progressive vertebral body height loss of known L1 pathologic fracture. New pathologic fracture of the mid left iliac crest (series 5, image 43). Lytic lesion with extraosseous soft tissue component at the right hemi sacrum is similar to prior. Review of the MIP images confirms the above findings. IMPRESSION: 1. No evidence of acute pulmonary embolism. Chronic nonocclusive thrombus involving segmental  branch pulmonary arteries of the left lower lobe and lingula, not appreciably changed from prior. 2. Enlarging small to moderate bilateral pleural effusions with associated compressive atelectasis. Diffuse interlobular septal thickening and ground-glass attenuation throughout the lung fields, suggestive of pulmonary edema. 3. 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 Progressive height loss of a known pathologic fractures of T12 and L1. New nondisplaced pathologic fracture of the T4 spinous process. New pathologic fracture of the mid left iliac crest. 4. Extensive hepatic metastatic disease, similar in degree to the previous CT. 5. Stable 2.0 cm left adrenal nodule, suspicious for metastatic involvement. 6. Sigmoid diverticulosis without evidence of acute diverticulitis. 7. Aortic and coronary artery atherosclerosis (ICD10-I70.0). Electronically Signed   By: Davina Poke D.O.   On: 03/02/2022 11:45   CT ABDOMEN PELVIS W CONTRAST  Result Date: 03/02/2022 CLINICAL DATA:  Pulmonary embolism (PE) suspected, high prob; Abdominal pain, acute, nonlocalized. Metastatic breast cancer EXAM: CT ANGIOGRAPHY CHEST CT ABDOMEN AND PELVIS WITH CONTRAST TECHNIQUE: Multidetector CT imaging of the chest was performed using the standard protocol during bolus administration of intravenous contrast. Multiplanar CT image reconstructions and MIPs were obtained to evaluate the vascular anatomy. Multidetector CT imaging of the abdomen and pelvis was performed using the standard protocol during bolus administration of intravenous contrast. RADIATION DOSE REDUCTION: This exam was performed according to the departmental dose-optimization program which includes automated exposure control, adjustment of the mA and/or kV according to patient size and/or use of iterative reconstruction technique. CONTRAST:  168m OMNIPAQUE IOHEXOL 350 MG/ML SOLN COMPARISON:  CT  02/05/2022 FINDINGS: CTA CHEST FINDINGS Cardiovascular: Satisfactory opacification of the pulmonary arteries to the segmental branch level. 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 No new pulmonary arterial filling defects to suggest acute PE. No right heart strain. Thoracic aorta is nonaneurysmal. Scattered atherosclerotic vascular calcifications of the aorta and coronary arteries. Normal heart size. No pericardial effusion. Mediastinum/Nodes: Similar degree of soft tissue density throughout the mediastinum which may represent post radiation changes. No overt adenopathy. Thyroid, trachea, and esophagus demonstrate no significant findings. Lungs/Pleura: Enlarging small to moderate bilateral pleural effusions with associated compressive atelectasis. Diffuse interlobular septal thickening and ground-glass attenuation throughout the lung fields. No new or enlarging pulmonary nodules are identified within the limitations of this exam. 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 Numerous bilateral pathologic rib fractures. Progressive height loss of a pathologic fracture involving the superior endplate of TF64 New nondisplaced pathologic fracture of the  T4 spinous process. Review of the MIP images confirms the above findings. CT ABDOMEN and PELVIS FINDINGS Hepatobiliary: Extensive hepatic metastatic disease involving both hepatic lobes, similar in degree to the previous CT. Unremarkable gallbladder. No hyperdense gallstone. No biliary dilatation. Pancreas: Unremarkable. No pancreatic ductal dilatation or surrounding inflammatory changes. Spleen: Normal in size without focal abnormality. Adrenals/Urinary Tract: 2.0 cm left adrenal nodule, similar to prior. Unremarkable kidneys. No stone or hydronephrosis. Urinary bladder within  normal limits. Stomach/Bowel: Stomach is within normal limits. Sigmoid diverticulosis. No evidence of bowel wall thickening, distention, or inflammatory changes. Vascular/Lymphatic: Aortic atherosclerosis. No enlarged abdominal or pelvic lymph nodes. Reproductive: Status post hysterectomy. No adnexal masses. Other: No free fluid. No abdominopelvic fluid collection. No pneumoperitoneum. No abdominal wall hernia. 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 Slightly progressive vertebral body height loss of known L1 pathologic fracture. New pathologic fracture of the mid left iliac crest (series 5, image 43). Lytic lesion with extraosseous soft tissue component at the right hemi sacrum is similar to prior. Review of the MIP images confirms the above findings. IMPRESSION: 1. No evidence of acute pulmonary embolism. 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 2. Enlarging small to moderate bilateral pleural effusions with associated compressive atelectasis. Diffuse interlobular septal thickening and ground-glass attenuation throughout the lung fields, suggestive of pulmonary edema. 3. 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 Progressive height loss of a known pathologic fractures of T12 and L1. New nondisplaced pathologic fracture of the T4 spinous process. New pathologic fracture of the mid left iliac crest. 4. Extensive hepatic metastatic disease, similar in degree to the previous CT. 5. Stable 2.0 cm left adrenal nodule, suspicious for metastatic involvement. 6. Sigmoid diverticulosis without evidence of acute diverticulitis. 7. Aortic and coronary artery atherosclerosis (ICD10-I70.0). Electronically Signed   By: Davina Poke D.O.   On: 03/02/2022 11:45   DG Chest 2 View  Result Date:  03/02/2022 CLINICAL DATA:  Shortness of breath and cough EXAM: CHEST - 2 VIEW COMPARISON:  Chest x-ray dated December 28, 2021 FINDINGS: Cardiac and mediastinal contours are within normal limits. Diffuse interstitial opacities with fissural nodularity increased when compared with the prior radiograph. Trace left-greater-than-right pleural effusions. Osseous metastatic disease with bilateral pathologic rib fractures. No evidence of pneumothorax. IMPRESSION: 1. Diffuse interstitial opacities with fissural nodularity, possibly due to pulmonary edema, although fissural nodularity is concerning for component of lymphangitic spread of tumor. 2. Trace left-greater-than-right pleural effusions. 3. Osseous metastatic disease with bilateral pathologic rib fractures. Electronically Signed   By: Yetta Glassman M.D.   On: 03/02/2022 08:06    Microbiology: Recent Results (from the past 240 hour(s))  Resp panel by RT-PCR (RSV, Flu A&B, Covid) Anterior Nasal Swab     Status: None   Collection Time: 03/13/2022  2:50 PM   Specimen: Anterior Nasal Swab  Result Value Ref Range Status   SARS Coronavirus 2 by RT PCR NEGATIVE NEGATIVE Final    Comment: (NOTE) SARS-CoV-2 target nucleic acids are NOT DETECTED.  The SARS-CoV-2 RNA is generally detectable in upper respiratory specimens during the acute phase of infection. The lowest concentration of SARS-CoV-2 viral copies this assay can detect is 138 copies/mL. A negative result does not preclude SARS-Cov-2 infection and should not be used as the sole basis for treatment or other patient management decisions. A negative result may occur with  improper specimen collection/handling,  submission of specimen other than nasopharyngeal swab, presence of viral mutation(s) within the areas targeted by this assay, and inadequate number of viral copies(<138 copies/mL). A negative result must be combined with clinical observations, patient history, and epidemiological information.  The expected result is Negative.  Fact Sheet for Patients:  EntrepreneurPulse.com.au  Fact Sheet for Healthcare Providers:  IncredibleEmployment.be  This test is no t yet approved or cleared by the [place] FDA and  has been authorized for detection and/or diagnosis of SARS-CoV-2 by FDA under an Emergency Use Authorization (EUA). This EUA will remain  in effect (meaning this test can be used) for the duration of the COVID-19 declaration under Section 564(b)(1) of the Act, 21 U.S.C.section 360bbb-3(b)(1), unless the authorization is terminated  or revoked sooner.       Influenza A by PCR NEGATIVE NEGATIVE Final   Influenza B by PCR NEGATIVE NEGATIVE Final    Comment: (NOTE) The Xpert Xpress SARS-CoV-2/FLU/RSV plus assay is intended as an aid in the diagnosis of influenza from Nasopharyngeal swab specimens and should not be used as a sole basis for treatment. Nasal washings and aspirates are unacceptable for Xpert Xpress SARS-CoV-2/FLU/RSV testing.  Fact Sheet for Patients: EntrepreneurPulse.com.au  Fact Sheet for Healthcare Providers: IncredibleEmployment.be  This test is not yet approved or cleared by the [place] FDA and has been authorized for detection and/or diagnosis of SARS-CoV-2 by FDA under an Emergency Use Authorization (EUA). This EUA will remain in effect (meaning this test can be used) for the duration of the COVID-19 declaration under Section 564(b)(1) of the Act, 21 U.S.C. section 360bbb-3(b)(1), unless the authorization is terminated or revoked.     Resp Syncytial Virus by PCR NEGATIVE NEGATIVE Final    Comment: (NOTE) Fact Sheet for Patients: EntrepreneurPulse.com.au  Fact Sheet for Healthcare Providers: IncredibleEmployment.be  This test is not yet approved or cleared by the [place] FDA and has been authorized for detection and/or  diagnosis of SARS-CoV-2 by FDA under an Emergency Use Authorization (EUA). This EUA will remain in effect (meaning this test can be used) for the duration of the COVID-19 declaration under Section 564(b)(1) of the Act, 21 U.S.C. section 360bbb-3(b)(1), unless the authorization is terminated or revoked.  Performed at [facility], [address] [place], [place] [address]   Culture, blood (routine x 2)     Status: None (Preliminary result)   Collection Time: 03/09/2022  2:50 PM   Specimen: BLOOD LEFT ARM  Result Value Ref Range Status   Specimen Description BLOOD LEFT ARM  Final   Special Requests   Final    BOTTLES DRAWN AEROBIC AND ANAEROBIC Blood Culture adequate volume   Culture   Final    NO GROWTH 2 DAYS Performed at [facility], [address], [place] [place], [place] [address]    Report Status PENDING  Incomplete  Culture, blood (routine x 2)     Status: None (Preliminary result)   Collection Time: 03/15/2022  4:14 PM   Specimen: BLOOD  Result Value Ref Range Status   Specimen Description BLOOD BLOOD LEFT ARM  Final   Special Requests   Final    BOTTLES DRAWN AEROBIC AND ANAEROBIC Blood Culture results may not be optimal due to an excessive volume of blood received in culture bottles   Culture   Final    NO GROWTH 2 DAYS Performed at [facility], [address], [place],  [address]    Report Status PENDING  Incomplete  MRSA Next  Gen by PCR, Nasal     Status: None   Collection Time: 02/28/2022  4:14 PM   Specimen: Nasal Mucosa; Nasal Swab  Result Value Ref Range Status   MRSA by PCR Next Gen NOT DETECTED NOT DETECTED Final    Comment: (NOTE) The GeneXpert MRSA Assay (FDA approved for NASAL specimens only), is one component of a comprehensive MRSA colonization surveillance program. It is not intended to diagnose MRSA infection nor to guide or monitor treatment for MRSA infections. Test performance is not FDA approved in patients less than 86  years old. Performed at [facility], [address], [place], [place] [address]     Time spent: 38 minutes  Signed: Jennye Boroughs, MD Apr 08, 2022

## 2022-03-25 DEATH — deceased

## 2022-03-27 LAB — CULTURE, BLOOD (ROUTINE X 2)
Culture: NO GROWTH
Culture: NO GROWTH
Special Requests: ADEQUATE

## 2022-04-13 ENCOUNTER — Ambulatory Visit: Payer: Medicare Other | Admitting: Internal Medicine

## 2022-05-27 ENCOUNTER — Ambulatory Visit: Payer: Medicare Other | Admitting: Family Medicine

## 2022-06-04 ENCOUNTER — Other Ambulatory Visit: Payer: Self-pay | Admitting: Family Medicine

## 2022-06-04 DIAGNOSIS — E785 Hyperlipidemia, unspecified: Secondary | ICD-10-CM

## 2022-06-20 ENCOUNTER — Other Ambulatory Visit: Payer: Self-pay | Admitting: Family Medicine

## 2022-06-20 DIAGNOSIS — E785 Hyperlipidemia, unspecified: Secondary | ICD-10-CM
